# Patient Record
Sex: Female | Born: 1945
Health system: Southern US, Community
[De-identification: ages and names within clinical notes are randomized; demographics above are authoritative.]

## PROBLEM LIST (undated history)

## (undated) ENCOUNTER — Emergency Department (HOSPITAL_COMMUNITY): Payer: Medicare Other | Source: Home / Self Care

## (undated) DIAGNOSIS — Z531 Procedure and treatment not carried out because of patient's decision for reasons of belief and group pressure: Secondary | ICD-10-CM

## (undated) DIAGNOSIS — K5900 Constipation, unspecified: Secondary | ICD-10-CM

## (undated) DIAGNOSIS — F32A Depression, unspecified: Secondary | ICD-10-CM

## (undated) DIAGNOSIS — I70229 Atherosclerosis of native arteries of extremities with rest pain, unspecified extremity: Secondary | ICD-10-CM

## (undated) DIAGNOSIS — R011 Cardiac murmur, unspecified: Secondary | ICD-10-CM

## (undated) DIAGNOSIS — F419 Anxiety disorder, unspecified: Secondary | ICD-10-CM

## (undated) DIAGNOSIS — N186 End stage renal disease: Secondary | ICD-10-CM

## (undated) DIAGNOSIS — K219 Gastro-esophageal reflux disease without esophagitis: Secondary | ICD-10-CM

## (undated) DIAGNOSIS — I998 Other disorder of circulatory system: Secondary | ICD-10-CM

## (undated) DIAGNOSIS — D649 Anemia, unspecified: Secondary | ICD-10-CM

## (undated) DIAGNOSIS — E119 Type 2 diabetes mellitus without complications: Secondary | ICD-10-CM

## (undated) DIAGNOSIS — R413 Other amnesia: Secondary | ICD-10-CM

## (undated) DIAGNOSIS — I509 Heart failure, unspecified: Secondary | ICD-10-CM

## (undated) DIAGNOSIS — IMO0001 Reserved for inherently not codable concepts without codable children: Secondary | ICD-10-CM

## (undated) DIAGNOSIS — I739 Peripheral vascular disease, unspecified: Secondary | ICD-10-CM

## (undated) DIAGNOSIS — E785 Hyperlipidemia, unspecified: Secondary | ICD-10-CM

## (undated) DIAGNOSIS — F329 Major depressive disorder, single episode, unspecified: Secondary | ICD-10-CM

## (undated) DIAGNOSIS — G4730 Sleep apnea, unspecified: Secondary | ICD-10-CM

## (undated) DIAGNOSIS — R569 Unspecified convulsions: Secondary | ICD-10-CM

## (undated) DIAGNOSIS — I639 Cerebral infarction, unspecified: Secondary | ICD-10-CM

## (undated) DIAGNOSIS — I10 Essential (primary) hypertension: Secondary | ICD-10-CM

## (undated) DIAGNOSIS — Z992 Dependence on renal dialysis: Secondary | ICD-10-CM

## (undated) HISTORY — DX: Hyperlipidemia, unspecified: E78.5

## (undated) HISTORY — DX: Other disorder of circulatory system: I99.8

## (undated) HISTORY — PX: TOE AMPUTATION: SHX809

## (undated) HISTORY — DX: Atherosclerosis of native arteries of extremities with rest pain, unspecified extremity: I70.229

## (undated) HISTORY — PX: DILATION AND CURETTAGE OF UTERUS: SHX78

## (undated) HISTORY — PX: ABDOMINAL HYSTERECTOMY: SHX81

## (undated) HISTORY — PX: RETINOPATHY SURGERY: SHX765

## (undated) HISTORY — PX: EYE SURGERY: SHX253

## (undated) HISTORY — PX: TONSILLECTOMY: SUR1361

---

## 2013-11-12 DIAGNOSIS — I639 Cerebral infarction, unspecified: Secondary | ICD-10-CM

## 2013-11-12 HISTORY — DX: Cerebral infarction, unspecified: I63.9

## 2014-01-28 ENCOUNTER — Emergency Department (HOSPITAL_COMMUNITY)
Admission: EM | Admit: 2014-01-28 | Discharge: 2014-01-28 | Disposition: A | Discharge status: Home / Self Care | Payer: Medicare Other | Attending: Emergency Medicine | Admitting: Emergency Medicine

## 2014-01-28 ENCOUNTER — Encounter (HOSPITAL_COMMUNITY): Payer: Self-pay | Admitting: Emergency Medicine

## 2014-01-28 DIAGNOSIS — Z862 Personal history of diseases of the blood and blood-forming organs and certain disorders involving the immune mechanism: Secondary | ICD-10-CM | POA: Insufficient documentation

## 2014-01-28 DIAGNOSIS — I10 Essential (primary) hypertension: Secondary | ICD-10-CM | POA: Insufficient documentation

## 2014-01-28 DIAGNOSIS — E1169 Type 2 diabetes mellitus with other specified complication: Secondary | ICD-10-CM | POA: Insufficient documentation

## 2014-01-28 DIAGNOSIS — F17200 Nicotine dependence, unspecified, uncomplicated: Secondary | ICD-10-CM | POA: Insufficient documentation

## 2014-01-28 DIAGNOSIS — E162 Hypoglycemia, unspecified: Secondary | ICD-10-CM

## 2014-01-28 DIAGNOSIS — I69949 Monoplegia of lower limb following unspecified cerebrovascular disease affecting unspecified side: Secondary | ICD-10-CM | POA: Insufficient documentation

## 2014-01-28 DIAGNOSIS — S98139A Complete traumatic amputation of one unspecified lesser toe, initial encounter: Secondary | ICD-10-CM | POA: Insufficient documentation

## 2014-01-28 DIAGNOSIS — Z87448 Personal history of other diseases of urinary system: Secondary | ICD-10-CM | POA: Insufficient documentation

## 2014-01-28 HISTORY — DX: Essential (primary) hypertension: I10

## 2014-01-28 HISTORY — DX: Cerebral infarction, unspecified: I63.9

## 2014-01-28 HISTORY — DX: Anemia, unspecified: D64.9

## 2014-01-28 LAB — CBC WITH DIFFERENTIAL/PLATELET
BASOS ABS: 0 10*3/uL (ref 0.0–0.1)
BASOS PCT: 0 % (ref 0–1)
Eosinophils Absolute: 0.2 10*3/uL (ref 0.0–0.7)
Eosinophils Relative: 2 % (ref 0–5)
HCT: 30.9 % — ABNORMAL LOW (ref 36.0–46.0)
Hemoglobin: 10 g/dL — ABNORMAL LOW (ref 12.0–15.0)
Lymphocytes Relative: 13 % (ref 12–46)
Lymphs Abs: 1.1 10*3/uL (ref 0.7–4.0)
MCH: 28.5 pg (ref 26.0–34.0)
MCHC: 32.4 g/dL (ref 30.0–36.0)
MCV: 88 fL (ref 78.0–100.0)
Monocytes Absolute: 0.5 10*3/uL (ref 0.1–1.0)
Monocytes Relative: 6 % (ref 3–12)
NEUTROS ABS: 6.8 10*3/uL (ref 1.7–7.7)
NEUTROS PCT: 79 % — AB (ref 43–77)
PLATELETS: 250 10*3/uL (ref 150–400)
RBC: 3.51 MIL/uL — ABNORMAL LOW (ref 3.87–5.11)
RDW: 14.2 % (ref 11.5–15.5)
WBC: 8.6 10*3/uL (ref 4.0–10.5)

## 2014-01-28 LAB — COMPREHENSIVE METABOLIC PANEL
ALBUMIN: 3.4 g/dL — AB (ref 3.5–5.2)
ALT: 10 U/L (ref 0–35)
AST: 17 U/L (ref 0–37)
Alkaline Phosphatase: 147 U/L — ABNORMAL HIGH (ref 39–117)
BUN: 39 mg/dL — ABNORMAL HIGH (ref 6–23)
CO2: 22 mEq/L (ref 19–32)
CREATININE: 2.55 mg/dL — AB (ref 0.50–1.10)
Calcium: 9.2 mg/dL (ref 8.4–10.5)
Chloride: 103 mEq/L (ref 96–112)
GFR calc Af Amer: 21 mL/min — ABNORMAL LOW (ref >90)
GFR calc non Af Amer: 18 mL/min — ABNORMAL LOW (ref >90)
Glucose, Bld: 197 mg/dL — ABNORMAL HIGH (ref 70–99)
POTASSIUM: 4.9 meq/L (ref 3.7–5.3)
Sodium: 139 mEq/L (ref 137–147)
Total Bilirubin: <0.2 mg/dL — ABNORMAL LOW (ref 0.3–1.2)
Total Protein: 7.2 g/dL (ref 6.0–8.3)

## 2014-01-28 LAB — URINE MICROSCOPIC-ADD ON

## 2014-01-28 LAB — URINALYSIS, ROUTINE W REFLEX MICROSCOPIC
Bilirubin Urine: NEGATIVE
GLUCOSE, UA: NEGATIVE mg/dL
HGB URINE DIPSTICK: NEGATIVE
Ketones, ur: NEGATIVE mg/dL
Nitrite: NEGATIVE
PROTEIN: 100 mg/dL — AB
Specific Gravity, Urine: 1.012 (ref 1.005–1.030)
Urobilinogen, UA: 0.2 mg/dL (ref 0.0–1.0)
pH: 6 (ref 5.0–8.0)

## 2014-01-28 LAB — CBG MONITORING, ED
GLUCOSE-CAPILLARY: 103 mg/dL — AB (ref 70–99)
GLUCOSE-CAPILLARY: 240 mg/dL — AB (ref 70–99)
Glucose-Capillary: 381 mg/dL — ABNORMAL HIGH (ref 70–99)

## 2014-01-28 NOTE — ED Provider Notes (Signed)
CSN: [id number]     Arrival date & time 01/28/14  1028 History   First MD Initiated Contact with Patient 01/28/14 1041     Chief Complaint  Patient presents with  . Hypoglycemia     (Consider location/radiation/quality/duration/timing/severity/associated sxs/prior Treatment) HPI Comments: 68 year old female presents with hypoglycemia. She states she woke up 4 hours prior to arrival and felt dizzy. She states everyday for the past several months she felt dizzy but her glucoses are normally over 100 in the morning. Dysmorphic checked her sugar and it was 53. She felt more dizzy than normal and relates this to multiple prior episodes of hypoglycemia (though none recently) she went downstairs and had a piece of cake. This did not seem to improve her much so she called the ambulance. EMS gave her glucose and brought her glucose over 100 and she feels like her symptoms have resolved. She states she's no longer has dizziness. The patient has diabetes, hypertension, and chronic kidney disease. She's not know her normal creatinine. Her primary care physician is Dr. Garner Gavel in [place]. Patient states she otherwise has not felt ill. She's had blurry vision for several weeks to over a month but is not progressive. She's not been able to talk to her primary physician about it. It is bilateral. The patient states that she's not have a headache, fever, chest pain, shortness of breath, cough, or urinary symptoms. She last took her insulin last night at 10 PM and took 10 units of Lantus in 2-3 units of NovoLog because she was having a late meal.   Past Medical History  Diagnosis Date  . Diabetes mellitus without complication   . Kidney disease   . Hypertension   . Anemia   . Stroke    Past Surgical History  Procedure Laterality Date  . Toe amputation      R foot  . Abdominal hysterectomy     No family history on file. History  Substance Use Topics  . Smoking status: Current Every Day Smoker  .  Smokeless tobacco: Never Used  . Alcohol Use: No   OB History   Grav Para Term Preterm Abortions TAB SAB Ect Mult Living                 Review of Systems  Constitutional: Negative for fever.  Eyes: Positive for visual disturbance.  Respiratory: Negative for cough and shortness of breath.   Cardiovascular: Negative for chest pain.  Gastrointestinal: Negative for vomiting and abdominal pain.  Genitourinary: Negative for dysuria.  Neurological: Positive for dizziness. Negative for weakness and headaches.  All other systems reviewed and are negative.      Allergies  Review of patient's allergies indicates not on file.  Home Medications  No current outpatient prescriptions on file. BP 163/65  Pulse 71  Temp(Src) 97.4 F (36.3 C) (Oral)  Resp 16  SpO2 99% Physical Exam  Nursing note and vitals reviewed. Constitutional: She is oriented to person, place, and time. She appears well-developed and well-nourished.  HENT:  Head: Normocephalic and atraumatic.  Right Ear: External ear normal.  Left Ear: External ear normal.  Nose: Nose normal.  Eyes: EOM are normal. Pupils are equal, round, and reactive to light. Right eye exhibits no discharge. Left eye exhibits no discharge.  Cardiovascular: Normal rate, regular rhythm and normal heart sounds.   Pulmonary/Chest: Effort normal and breath sounds normal. She has no wheezes. She has no rales.  Abdominal: Soft. There is no tenderness.  Neurological:  She is alert and oriented to person, place, and time. GCS eye subscore is 4. GCS verbal subscore is 5. GCS motor subscore is 6.  Mild RLE weakness compared to left, patient reports this is from an old stroke. Otherwise her strength in her extremities is normal and equal. CN 2-12 grossly intact.  Skin: Skin is warm and dry.    ED Course  Procedures (including critical care time) Labs Review Labs Reviewed  CBC WITH DIFFERENTIAL - Abnormal; Notable for the following:    RBC 3.51 (*)     Hemoglobin 10.0 (*)    HCT 30.9 (*)    Neutrophils Relative % 79 (*)    All other components within normal limits  COMPREHENSIVE METABOLIC PANEL - Abnormal; Notable for the following:    Glucose, Bld 197 (*)    BUN 39 (*)    Creatinine, Ser 2.55 (*)    Albumin 3.4 (*)    Alkaline Phosphatase 147 (*)    Total Bilirubin <0.2 (*)    GFR calc non Af Amer 18 (*)    GFR calc Af Amer 21 (*)    All other components within normal limits  URINALYSIS, ROUTINE W REFLEX MICROSCOPIC - Abnormal; Notable for the following:    Protein, ur 100 (*)    Leukocytes, UA TRACE (*)    All other components within normal limits  URINE MICROSCOPIC-ADD ON - Abnormal; Notable for the following:    Bacteria, UA FEW (*)    All other components within normal limits  CBG MONITORING, ED - Abnormal; Notable for the following:    Glucose-Capillary 103 (*)    All other components within normal limits  CBG MONITORING, ED - Abnormal; Notable for the following:    Glucose-Capillary 240 (*)    All other components within normal limits  CBG MONITORING, ED - Abnormal; Notable for the following:    Glucose-Capillary 381 (*)    All other components within normal limits   Imaging Review No results found.   EKG Interpretation None      MDM   Final diagnoses:  Hypoglycemia    Patient's dizzy symptoms resolved with normalization of her glucose. She has had bilateral blurry vision for weeks, likely from either her diabetes or recent eye surgeries (several months ago). She has no eye pain and this appears chronic/subacute. I called her PCP, Dr. Hendricks Limes, who relates her Cr has been between 2.3 and 2.7, so her Cr here is at baseline. She was monitored in the ED, and has no recurrence of hypotension. He will see her closely as an outpatient, recommends she call the office at discharge, which I have explained to her. She feels comfortable going home, and at this time is stable for discharge.     Ephraim Hamburger,  MD 01/28/14 [phone]

## 2014-01-28 NOTE — ED Notes (Signed)
Pt states she feels good now but did feel weak and tired before EMS arrived, pt states she has been eating less but taking the same amount of insulin.

## 2014-01-28 NOTE — Discharge Instructions (Signed)

## 2014-01-28 NOTE — ED Notes (Signed)
Bed: XV40 Expected date:  Expected time:  Means of arrival:  Comments: EMS- Hypoglycemia

## 2014-01-28 NOTE — ED Notes (Signed)
Pt states she feels a lot better and is ready to go home, states also she just had a normal BM.

## 2014-01-28 NOTE — ED Notes (Signed)
Per EMS pt called out for high BP, when arrived pt was lethargic, stated she felt like CBG was low had just eaten a piece of cake, checked was 53, pt ate jelly sandwich, CBG 70 after, then dropped back down to 67, given instaglucose, then CBG 101, is on novolog and lantus, did take last night, couldn't remember how much she ate yesterday, pt has kidney disease which is depressed her appetite, thinking pt is not eating enough for amount of insulin pt taking. 20G LAC.

## 2014-01-28 NOTE — ED Notes (Signed)
Pt given chicken broth, graham crackers and peanut butter to eat.

## 2014-01-28 NOTE — ED Notes (Signed)
Pt given chicken broth and grape juice per pt request

## 2014-01-28 NOTE — ED Notes (Addendum)
Pt states "I'm starting to feel weird again", pt seems to be weak and tired, CBG 240. Pt states she feels, dizzy, lightheaded, weak and tired.

## 2014-11-12 DIAGNOSIS — N186 End stage renal disease: Secondary | ICD-10-CM | POA: Diagnosis not present

## 2014-11-12 DIAGNOSIS — I10 Essential (primary) hypertension: Secondary | ICD-10-CM | POA: Diagnosis not present

## 2014-11-12 DIAGNOSIS — R262 Difficulty in walking, not elsewhere classified: Secondary | ICD-10-CM | POA: Diagnosis not present

## 2014-11-12 DIAGNOSIS — M6281 Muscle weakness (generalized): Secondary | ICD-10-CM | POA: Diagnosis not present

## 2014-11-12 DIAGNOSIS — E119 Type 2 diabetes mellitus without complications: Secondary | ICD-10-CM | POA: Diagnosis not present

## 2014-11-12 DIAGNOSIS — R1312 Dysphagia, oropharyngeal phase: Secondary | ICD-10-CM | POA: Diagnosis not present

## 2014-11-12 DIAGNOSIS — Z992 Dependence on renal dialysis: Secondary | ICD-10-CM | POA: Diagnosis not present

## 2014-11-12 DIAGNOSIS — D631 Anemia in chronic kidney disease: Secondary | ICD-10-CM | POA: Diagnosis not present

## 2014-11-13 DIAGNOSIS — D688 Other specified coagulation defects: Secondary | ICD-10-CM | POA: Diagnosis not present

## 2014-11-13 DIAGNOSIS — N2581 Secondary hyperparathyroidism of renal origin: Secondary | ICD-10-CM | POA: Diagnosis not present

## 2014-11-13 DIAGNOSIS — D631 Anemia in chronic kidney disease: Secondary | ICD-10-CM | POA: Diagnosis not present

## 2014-11-13 DIAGNOSIS — Z992 Dependence on renal dialysis: Secondary | ICD-10-CM | POA: Diagnosis not present

## 2014-11-13 DIAGNOSIS — N186 End stage renal disease: Secondary | ICD-10-CM | POA: Diagnosis not present

## 2014-11-13 DIAGNOSIS — D509 Iron deficiency anemia, unspecified: Secondary | ICD-10-CM | POA: Diagnosis not present

## 2014-11-15 DIAGNOSIS — N186 End stage renal disease: Secondary | ICD-10-CM | POA: Diagnosis not present

## 2014-11-15 DIAGNOSIS — Z992 Dependence on renal dialysis: Secondary | ICD-10-CM | POA: Diagnosis not present

## 2014-11-15 DIAGNOSIS — D631 Anemia in chronic kidney disease: Secondary | ICD-10-CM | POA: Diagnosis not present

## 2014-11-15 DIAGNOSIS — R262 Difficulty in walking, not elsewhere classified: Secondary | ICD-10-CM | POA: Diagnosis not present

## 2014-11-15 DIAGNOSIS — R1312 Dysphagia, oropharyngeal phase: Secondary | ICD-10-CM | POA: Diagnosis not present

## 2014-11-15 DIAGNOSIS — M6281 Muscle weakness (generalized): Secondary | ICD-10-CM | POA: Diagnosis not present

## 2014-11-16 DIAGNOSIS — N2581 Secondary hyperparathyroidism of renal origin: Secondary | ICD-10-CM | POA: Diagnosis not present

## 2014-11-16 DIAGNOSIS — Z992 Dependence on renal dialysis: Secondary | ICD-10-CM | POA: Diagnosis not present

## 2014-11-16 DIAGNOSIS — N186 End stage renal disease: Secondary | ICD-10-CM | POA: Diagnosis not present

## 2014-11-16 DIAGNOSIS — D509 Iron deficiency anemia, unspecified: Secondary | ICD-10-CM | POA: Diagnosis not present

## 2014-11-16 DIAGNOSIS — D631 Anemia in chronic kidney disease: Secondary | ICD-10-CM | POA: Diagnosis not present

## 2014-11-16 DIAGNOSIS — D688 Other specified coagulation defects: Secondary | ICD-10-CM | POA: Diagnosis not present

## 2014-11-17 DIAGNOSIS — N186 End stage renal disease: Secondary | ICD-10-CM | POA: Diagnosis not present

## 2014-11-17 DIAGNOSIS — R262 Difficulty in walking, not elsewhere classified: Secondary | ICD-10-CM | POA: Diagnosis not present

## 2014-11-17 DIAGNOSIS — M6281 Muscle weakness (generalized): Secondary | ICD-10-CM | POA: Diagnosis not present

## 2014-11-17 DIAGNOSIS — R1312 Dysphagia, oropharyngeal phase: Secondary | ICD-10-CM | POA: Diagnosis not present

## 2014-11-17 DIAGNOSIS — D631 Anemia in chronic kidney disease: Secondary | ICD-10-CM | POA: Diagnosis not present

## 2014-11-17 DIAGNOSIS — Z992 Dependence on renal dialysis: Secondary | ICD-10-CM | POA: Diagnosis not present

## 2014-11-18 DIAGNOSIS — E1022 Type 1 diabetes mellitus with diabetic chronic kidney disease: Secondary | ICD-10-CM | POA: Diagnosis not present

## 2014-11-18 DIAGNOSIS — S37009A Unspecified injury of unspecified kidney, initial encounter: Secondary | ICD-10-CM | POA: Diagnosis not present

## 2014-11-18 DIAGNOSIS — N2581 Secondary hyperparathyroidism of renal origin: Secondary | ICD-10-CM | POA: Diagnosis not present

## 2014-11-18 DIAGNOSIS — N186 End stage renal disease: Secondary | ICD-10-CM | POA: Diagnosis not present

## 2014-11-18 DIAGNOSIS — E875 Hyperkalemia: Secondary | ICD-10-CM | POA: Diagnosis not present

## 2014-11-18 DIAGNOSIS — Z136 Encounter for screening for cardiovascular disorders: Secondary | ICD-10-CM | POA: Diagnosis not present

## 2014-11-18 DIAGNOSIS — Z992 Dependence on renal dialysis: Secondary | ICD-10-CM | POA: Diagnosis not present

## 2014-11-18 DIAGNOSIS — D688 Other specified coagulation defects: Secondary | ICD-10-CM | POA: Diagnosis not present

## 2014-11-18 DIAGNOSIS — I639 Cerebral infarction, unspecified: Secondary | ICD-10-CM | POA: Diagnosis not present

## 2014-11-18 DIAGNOSIS — J9690 Respiratory failure, unspecified, unspecified whether with hypoxia or hypercapnia: Secondary | ICD-10-CM | POA: Diagnosis not present

## 2014-11-18 DIAGNOSIS — D631 Anemia in chronic kidney disease: Secondary | ICD-10-CM | POA: Diagnosis not present

## 2014-11-18 DIAGNOSIS — I509 Heart failure, unspecified: Secondary | ICD-10-CM | POA: Diagnosis not present

## 2014-11-18 DIAGNOSIS — M6281 Muscle weakness (generalized): Secondary | ICD-10-CM | POA: Diagnosis not present

## 2014-11-18 DIAGNOSIS — Z131 Encounter for screening for diabetes mellitus: Secondary | ICD-10-CM | POA: Diagnosis not present

## 2014-11-18 DIAGNOSIS — D509 Iron deficiency anemia, unspecified: Secondary | ICD-10-CM | POA: Diagnosis not present

## 2014-11-18 DIAGNOSIS — R1312 Dysphagia, oropharyngeal phase: Secondary | ICD-10-CM | POA: Diagnosis not present

## 2014-11-18 DIAGNOSIS — R262 Difficulty in walking, not elsewhere classified: Secondary | ICD-10-CM | POA: Diagnosis not present

## 2014-11-19 DIAGNOSIS — R1312 Dysphagia, oropharyngeal phase: Secondary | ICD-10-CM | POA: Diagnosis not present

## 2014-11-19 DIAGNOSIS — N186 End stage renal disease: Secondary | ICD-10-CM | POA: Diagnosis not present

## 2014-11-19 DIAGNOSIS — M6281 Muscle weakness (generalized): Secondary | ICD-10-CM | POA: Diagnosis not present

## 2014-11-19 DIAGNOSIS — R262 Difficulty in walking, not elsewhere classified: Secondary | ICD-10-CM | POA: Diagnosis not present

## 2014-11-19 DIAGNOSIS — D631 Anemia in chronic kidney disease: Secondary | ICD-10-CM | POA: Diagnosis not present

## 2014-11-19 DIAGNOSIS — Z992 Dependence on renal dialysis: Secondary | ICD-10-CM | POA: Diagnosis not present

## 2014-11-23 DIAGNOSIS — Z992 Dependence on renal dialysis: Secondary | ICD-10-CM | POA: Diagnosis not present

## 2014-11-23 DIAGNOSIS — D688 Other specified coagulation defects: Secondary | ICD-10-CM | POA: Diagnosis not present

## 2014-11-23 DIAGNOSIS — D509 Iron deficiency anemia, unspecified: Secondary | ICD-10-CM | POA: Diagnosis not present

## 2014-11-23 DIAGNOSIS — J9690 Respiratory failure, unspecified, unspecified whether with hypoxia or hypercapnia: Secondary | ICD-10-CM | POA: Diagnosis not present

## 2014-11-23 DIAGNOSIS — D631 Anemia in chronic kidney disease: Secondary | ICD-10-CM | POA: Diagnosis not present

## 2014-11-23 DIAGNOSIS — N2581 Secondary hyperparathyroidism of renal origin: Secondary | ICD-10-CM | POA: Diagnosis not present

## 2014-11-23 DIAGNOSIS — I509 Heart failure, unspecified: Secondary | ICD-10-CM | POA: Diagnosis not present

## 2014-11-23 DIAGNOSIS — N186 End stage renal disease: Secondary | ICD-10-CM | POA: Diagnosis not present

## 2014-11-23 DIAGNOSIS — I639 Cerebral infarction, unspecified: Secondary | ICD-10-CM | POA: Diagnosis not present

## 2014-11-25 DIAGNOSIS — D631 Anemia in chronic kidney disease: Secondary | ICD-10-CM | POA: Diagnosis not present

## 2014-11-25 DIAGNOSIS — Z992 Dependence on renal dialysis: Secondary | ICD-10-CM | POA: Diagnosis not present

## 2014-11-25 DIAGNOSIS — D509 Iron deficiency anemia, unspecified: Secondary | ICD-10-CM | POA: Diagnosis not present

## 2014-11-25 DIAGNOSIS — N186 End stage renal disease: Secondary | ICD-10-CM | POA: Diagnosis not present

## 2014-11-25 DIAGNOSIS — I639 Cerebral infarction, unspecified: Secondary | ICD-10-CM | POA: Diagnosis not present

## 2014-11-25 DIAGNOSIS — I509 Heart failure, unspecified: Secondary | ICD-10-CM | POA: Diagnosis not present

## 2014-11-25 DIAGNOSIS — J9690 Respiratory failure, unspecified, unspecified whether with hypoxia or hypercapnia: Secondary | ICD-10-CM | POA: Diagnosis not present

## 2014-11-25 DIAGNOSIS — D688 Other specified coagulation defects: Secondary | ICD-10-CM | POA: Diagnosis not present

## 2014-11-25 DIAGNOSIS — N2581 Secondary hyperparathyroidism of renal origin: Secondary | ICD-10-CM | POA: Diagnosis not present

## 2014-11-28 DIAGNOSIS — R6889 Other general symptoms and signs: Secondary | ICD-10-CM | POA: Diagnosis not present

## 2014-11-28 DIAGNOSIS — E119 Type 2 diabetes mellitus without complications: Secondary | ICD-10-CM | POA: Diagnosis not present

## 2014-11-28 DIAGNOSIS — I120 Hypertensive chronic kidney disease with stage 5 chronic kidney disease or end stage renal disease: Secondary | ICD-10-CM | POA: Diagnosis not present

## 2014-11-28 DIAGNOSIS — Z794 Long term (current) use of insulin: Secondary | ICD-10-CM | POA: Diagnosis not present

## 2014-11-28 DIAGNOSIS — Z79899 Other long term (current) drug therapy: Secondary | ICD-10-CM | POA: Diagnosis not present

## 2014-11-28 DIAGNOSIS — Z9181 History of falling: Secondary | ICD-10-CM | POA: Diagnosis not present

## 2014-11-28 DIAGNOSIS — Z8673 Personal history of transient ischemic attack (TIA), and cerebral infarction without residual deficits: Secondary | ICD-10-CM | POA: Diagnosis not present

## 2014-11-28 DIAGNOSIS — R531 Weakness: Secondary | ICD-10-CM | POA: Diagnosis not present

## 2014-11-28 DIAGNOSIS — R112 Nausea with vomiting, unspecified: Secondary | ICD-10-CM | POA: Diagnosis not present

## 2014-11-28 DIAGNOSIS — I509 Heart failure, unspecified: Secondary | ICD-10-CM | POA: Diagnosis not present

## 2014-11-28 DIAGNOSIS — K5900 Constipation, unspecified: Secondary | ICD-10-CM | POA: Diagnosis not present

## 2014-11-28 DIAGNOSIS — I2510 Atherosclerotic heart disease of native coronary artery without angina pectoris: Secondary | ICD-10-CM | POA: Diagnosis not present

## 2014-11-28 DIAGNOSIS — F329 Major depressive disorder, single episode, unspecified: Secondary | ICD-10-CM | POA: Diagnosis not present

## 2014-11-28 DIAGNOSIS — M2550 Pain in unspecified joint: Secondary | ICD-10-CM | POA: Diagnosis not present

## 2014-11-28 DIAGNOSIS — R110 Nausea: Secondary | ICD-10-CM | POA: Diagnosis not present

## 2014-11-28 DIAGNOSIS — N186 End stage renal disease: Secondary | ICD-10-CM | POA: Diagnosis not present

## 2014-12-04 DIAGNOSIS — N2581 Secondary hyperparathyroidism of renal origin: Secondary | ICD-10-CM | POA: Diagnosis not present

## 2014-12-04 DIAGNOSIS — I509 Heart failure, unspecified: Secondary | ICD-10-CM | POA: Diagnosis not present

## 2014-12-04 DIAGNOSIS — D631 Anemia in chronic kidney disease: Secondary | ICD-10-CM | POA: Diagnosis not present

## 2014-12-04 DIAGNOSIS — D509 Iron deficiency anemia, unspecified: Secondary | ICD-10-CM | POA: Diagnosis not present

## 2014-12-04 DIAGNOSIS — Z992 Dependence on renal dialysis: Secondary | ICD-10-CM | POA: Diagnosis not present

## 2014-12-04 DIAGNOSIS — N186 End stage renal disease: Secondary | ICD-10-CM | POA: Diagnosis not present

## 2014-12-04 DIAGNOSIS — I5031 Acute diastolic (congestive) heart failure: Secondary | ICD-10-CM | POA: Diagnosis not present

## 2014-12-04 DIAGNOSIS — D688 Other specified coagulation defects: Secondary | ICD-10-CM | POA: Diagnosis not present

## 2014-12-04 DIAGNOSIS — J9600 Acute respiratory failure, unspecified whether with hypoxia or hypercapnia: Secondary | ICD-10-CM | POA: Diagnosis not present

## 2014-12-04 DIAGNOSIS — I639 Cerebral infarction, unspecified: Secondary | ICD-10-CM | POA: Diagnosis not present

## 2014-12-06 DIAGNOSIS — N186 End stage renal disease: Secondary | ICD-10-CM | POA: Diagnosis not present

## 2014-12-07 DIAGNOSIS — D631 Anemia in chronic kidney disease: Secondary | ICD-10-CM | POA: Diagnosis not present

## 2014-12-07 DIAGNOSIS — D688 Other specified coagulation defects: Secondary | ICD-10-CM | POA: Diagnosis not present

## 2014-12-07 DIAGNOSIS — Z992 Dependence on renal dialysis: Secondary | ICD-10-CM | POA: Diagnosis not present

## 2014-12-07 DIAGNOSIS — D509 Iron deficiency anemia, unspecified: Secondary | ICD-10-CM | POA: Diagnosis not present

## 2014-12-07 DIAGNOSIS — I509 Heart failure, unspecified: Secondary | ICD-10-CM | POA: Diagnosis not present

## 2014-12-07 DIAGNOSIS — N186 End stage renal disease: Secondary | ICD-10-CM | POA: Diagnosis not present

## 2014-12-07 DIAGNOSIS — E119 Type 2 diabetes mellitus without complications: Secondary | ICD-10-CM | POA: Diagnosis not present

## 2014-12-07 DIAGNOSIS — I639 Cerebral infarction, unspecified: Secondary | ICD-10-CM | POA: Diagnosis not present

## 2014-12-07 DIAGNOSIS — N2581 Secondary hyperparathyroidism of renal origin: Secondary | ICD-10-CM | POA: Diagnosis not present

## 2014-12-09 DIAGNOSIS — N186 End stage renal disease: Secondary | ICD-10-CM | POA: Diagnosis not present

## 2014-12-09 DIAGNOSIS — I509 Heart failure, unspecified: Secondary | ICD-10-CM | POA: Diagnosis not present

## 2014-12-09 DIAGNOSIS — I639 Cerebral infarction, unspecified: Secondary | ICD-10-CM | POA: Diagnosis not present

## 2014-12-09 DIAGNOSIS — D688 Other specified coagulation defects: Secondary | ICD-10-CM | POA: Diagnosis not present

## 2014-12-09 DIAGNOSIS — Z992 Dependence on renal dialysis: Secondary | ICD-10-CM | POA: Diagnosis not present

## 2014-12-09 DIAGNOSIS — D509 Iron deficiency anemia, unspecified: Secondary | ICD-10-CM | POA: Diagnosis not present

## 2014-12-09 DIAGNOSIS — D631 Anemia in chronic kidney disease: Secondary | ICD-10-CM | POA: Diagnosis not present

## 2014-12-09 DIAGNOSIS — E119 Type 2 diabetes mellitus without complications: Secondary | ICD-10-CM | POA: Diagnosis not present

## 2014-12-09 DIAGNOSIS — N2581 Secondary hyperparathyroidism of renal origin: Secondary | ICD-10-CM | POA: Diagnosis not present

## 2014-12-11 DIAGNOSIS — I639 Cerebral infarction, unspecified: Secondary | ICD-10-CM | POA: Diagnosis not present

## 2014-12-11 DIAGNOSIS — I509 Heart failure, unspecified: Secondary | ICD-10-CM | POA: Diagnosis not present

## 2014-12-11 DIAGNOSIS — Z992 Dependence on renal dialysis: Secondary | ICD-10-CM | POA: Diagnosis not present

## 2014-12-11 DIAGNOSIS — N2581 Secondary hyperparathyroidism of renal origin: Secondary | ICD-10-CM | POA: Diagnosis not present

## 2014-12-11 DIAGNOSIS — D631 Anemia in chronic kidney disease: Secondary | ICD-10-CM | POA: Diagnosis not present

## 2014-12-11 DIAGNOSIS — N186 End stage renal disease: Secondary | ICD-10-CM | POA: Diagnosis not present

## 2014-12-11 DIAGNOSIS — D509 Iron deficiency anemia, unspecified: Secondary | ICD-10-CM | POA: Diagnosis not present

## 2014-12-11 DIAGNOSIS — D688 Other specified coagulation defects: Secondary | ICD-10-CM | POA: Diagnosis not present

## 2014-12-11 DIAGNOSIS — E119 Type 2 diabetes mellitus without complications: Secondary | ICD-10-CM | POA: Diagnosis not present

## 2014-12-13 DIAGNOSIS — E139 Other specified diabetes mellitus without complications: Secondary | ICD-10-CM | POA: Diagnosis not present

## 2014-12-13 DIAGNOSIS — N186 End stage renal disease: Secondary | ICD-10-CM | POA: Diagnosis not present

## 2014-12-23 DIAGNOSIS — N2581 Secondary hyperparathyroidism of renal origin: Secondary | ICD-10-CM | POA: Diagnosis not present

## 2014-12-23 DIAGNOSIS — D509 Iron deficiency anemia, unspecified: Secondary | ICD-10-CM | POA: Diagnosis not present

## 2014-12-23 DIAGNOSIS — D631 Anemia in chronic kidney disease: Secondary | ICD-10-CM | POA: Diagnosis not present

## 2014-12-23 DIAGNOSIS — N186 End stage renal disease: Secondary | ICD-10-CM | POA: Diagnosis not present

## 2014-12-23 DIAGNOSIS — D688 Other specified coagulation defects: Secondary | ICD-10-CM | POA: Diagnosis not present

## 2014-12-23 DIAGNOSIS — Z992 Dependence on renal dialysis: Secondary | ICD-10-CM | POA: Diagnosis not present

## 2014-12-23 DIAGNOSIS — I639 Cerebral infarction, unspecified: Secondary | ICD-10-CM | POA: Diagnosis not present

## 2014-12-23 DIAGNOSIS — E119 Type 2 diabetes mellitus without complications: Secondary | ICD-10-CM | POA: Diagnosis not present

## 2014-12-23 DIAGNOSIS — I509 Heart failure, unspecified: Secondary | ICD-10-CM | POA: Diagnosis not present

## 2014-12-25 DIAGNOSIS — E119 Type 2 diabetes mellitus without complications: Secondary | ICD-10-CM | POA: Diagnosis not present

## 2014-12-25 DIAGNOSIS — Z992 Dependence on renal dialysis: Secondary | ICD-10-CM | POA: Diagnosis not present

## 2014-12-25 DIAGNOSIS — D509 Iron deficiency anemia, unspecified: Secondary | ICD-10-CM | POA: Diagnosis not present

## 2014-12-25 DIAGNOSIS — I509 Heart failure, unspecified: Secondary | ICD-10-CM | POA: Diagnosis not present

## 2014-12-25 DIAGNOSIS — N186 End stage renal disease: Secondary | ICD-10-CM | POA: Diagnosis not present

## 2014-12-25 DIAGNOSIS — D631 Anemia in chronic kidney disease: Secondary | ICD-10-CM | POA: Diagnosis not present

## 2014-12-25 DIAGNOSIS — N2581 Secondary hyperparathyroidism of renal origin: Secondary | ICD-10-CM | POA: Diagnosis not present

## 2014-12-25 DIAGNOSIS — I639 Cerebral infarction, unspecified: Secondary | ICD-10-CM | POA: Diagnosis not present

## 2014-12-25 DIAGNOSIS — D688 Other specified coagulation defects: Secondary | ICD-10-CM | POA: Diagnosis not present

## 2014-12-28 DIAGNOSIS — D688 Other specified coagulation defects: Secondary | ICD-10-CM | POA: Diagnosis not present

## 2014-12-28 DIAGNOSIS — I639 Cerebral infarction, unspecified: Secondary | ICD-10-CM | POA: Diagnosis not present

## 2014-12-28 DIAGNOSIS — I509 Heart failure, unspecified: Secondary | ICD-10-CM | POA: Diagnosis not present

## 2014-12-28 DIAGNOSIS — N186 End stage renal disease: Secondary | ICD-10-CM | POA: Diagnosis not present

## 2014-12-28 DIAGNOSIS — D509 Iron deficiency anemia, unspecified: Secondary | ICD-10-CM | POA: Diagnosis not present

## 2014-12-28 DIAGNOSIS — Z992 Dependence on renal dialysis: Secondary | ICD-10-CM | POA: Diagnosis not present

## 2014-12-28 DIAGNOSIS — D631 Anemia in chronic kidney disease: Secondary | ICD-10-CM | POA: Diagnosis not present

## 2014-12-28 DIAGNOSIS — N2581 Secondary hyperparathyroidism of renal origin: Secondary | ICD-10-CM | POA: Diagnosis not present

## 2014-12-28 DIAGNOSIS — E119 Type 2 diabetes mellitus without complications: Secondary | ICD-10-CM | POA: Diagnosis not present

## 2014-12-30 DIAGNOSIS — E119 Type 2 diabetes mellitus without complications: Secondary | ICD-10-CM | POA: Diagnosis not present

## 2014-12-30 DIAGNOSIS — I639 Cerebral infarction, unspecified: Secondary | ICD-10-CM | POA: Diagnosis not present

## 2014-12-30 DIAGNOSIS — N186 End stage renal disease: Secondary | ICD-10-CM | POA: Diagnosis not present

## 2014-12-30 DIAGNOSIS — Z992 Dependence on renal dialysis: Secondary | ICD-10-CM | POA: Diagnosis not present

## 2014-12-30 DIAGNOSIS — N2581 Secondary hyperparathyroidism of renal origin: Secondary | ICD-10-CM | POA: Diagnosis not present

## 2014-12-30 DIAGNOSIS — D631 Anemia in chronic kidney disease: Secondary | ICD-10-CM | POA: Diagnosis not present

## 2014-12-30 DIAGNOSIS — D688 Other specified coagulation defects: Secondary | ICD-10-CM | POA: Diagnosis not present

## 2014-12-30 DIAGNOSIS — D509 Iron deficiency anemia, unspecified: Secondary | ICD-10-CM | POA: Diagnosis not present

## 2014-12-30 DIAGNOSIS — I509 Heart failure, unspecified: Secondary | ICD-10-CM | POA: Diagnosis not present

## 2015-01-01 DIAGNOSIS — D509 Iron deficiency anemia, unspecified: Secondary | ICD-10-CM | POA: Diagnosis not present

## 2015-01-01 DIAGNOSIS — I509 Heart failure, unspecified: Secondary | ICD-10-CM | POA: Diagnosis not present

## 2015-01-01 DIAGNOSIS — Z992 Dependence on renal dialysis: Secondary | ICD-10-CM | POA: Diagnosis not present

## 2015-01-01 DIAGNOSIS — D631 Anemia in chronic kidney disease: Secondary | ICD-10-CM | POA: Diagnosis not present

## 2015-01-01 DIAGNOSIS — D688 Other specified coagulation defects: Secondary | ICD-10-CM | POA: Diagnosis not present

## 2015-01-01 DIAGNOSIS — E119 Type 2 diabetes mellitus without complications: Secondary | ICD-10-CM | POA: Diagnosis not present

## 2015-01-01 DIAGNOSIS — N186 End stage renal disease: Secondary | ICD-10-CM | POA: Diagnosis not present

## 2015-01-01 DIAGNOSIS — I639 Cerebral infarction, unspecified: Secondary | ICD-10-CM | POA: Diagnosis not present

## 2015-01-01 DIAGNOSIS — N2581 Secondary hyperparathyroidism of renal origin: Secondary | ICD-10-CM | POA: Diagnosis not present

## 2015-01-04 DIAGNOSIS — I639 Cerebral infarction, unspecified: Secondary | ICD-10-CM | POA: Diagnosis not present

## 2015-01-04 DIAGNOSIS — D509 Iron deficiency anemia, unspecified: Secondary | ICD-10-CM | POA: Diagnosis not present

## 2015-01-04 DIAGNOSIS — E119 Type 2 diabetes mellitus without complications: Secondary | ICD-10-CM | POA: Diagnosis not present

## 2015-01-04 DIAGNOSIS — Z992 Dependence on renal dialysis: Secondary | ICD-10-CM | POA: Diagnosis not present

## 2015-01-04 DIAGNOSIS — D688 Other specified coagulation defects: Secondary | ICD-10-CM | POA: Diagnosis not present

## 2015-01-04 DIAGNOSIS — N2581 Secondary hyperparathyroidism of renal origin: Secondary | ICD-10-CM | POA: Diagnosis not present

## 2015-01-04 DIAGNOSIS — D631 Anemia in chronic kidney disease: Secondary | ICD-10-CM | POA: Diagnosis not present

## 2015-01-04 DIAGNOSIS — N186 End stage renal disease: Secondary | ICD-10-CM | POA: Diagnosis not present

## 2015-01-04 DIAGNOSIS — I509 Heart failure, unspecified: Secondary | ICD-10-CM | POA: Diagnosis not present

## 2015-01-06 DIAGNOSIS — I639 Cerebral infarction, unspecified: Secondary | ICD-10-CM | POA: Diagnosis not present

## 2015-01-06 DIAGNOSIS — H26493 Other secondary cataract, bilateral: Secondary | ICD-10-CM | POA: Diagnosis not present

## 2015-01-06 DIAGNOSIS — N186 End stage renal disease: Secondary | ICD-10-CM | POA: Diagnosis not present

## 2015-01-06 DIAGNOSIS — Z79899 Other long term (current) drug therapy: Secondary | ICD-10-CM | POA: Diagnosis not present

## 2015-01-06 DIAGNOSIS — H524 Presbyopia: Secondary | ICD-10-CM | POA: Diagnosis not present

## 2015-01-06 DIAGNOSIS — E11359 Type 2 diabetes mellitus with proliferative diabetic retinopathy without macular edema: Secondary | ICD-10-CM | POA: Diagnosis not present

## 2015-01-06 DIAGNOSIS — D509 Iron deficiency anemia, unspecified: Secondary | ICD-10-CM | POA: Diagnosis not present

## 2015-01-06 DIAGNOSIS — I509 Heart failure, unspecified: Secondary | ICD-10-CM | POA: Diagnosis not present

## 2015-01-06 DIAGNOSIS — Z794 Long term (current) use of insulin: Secondary | ICD-10-CM | POA: Diagnosis not present

## 2015-01-06 DIAGNOSIS — D631 Anemia in chronic kidney disease: Secondary | ICD-10-CM | POA: Diagnosis not present

## 2015-01-06 DIAGNOSIS — Z961 Presence of intraocular lens: Secondary | ICD-10-CM | POA: Diagnosis not present

## 2015-01-06 DIAGNOSIS — E119 Type 2 diabetes mellitus without complications: Secondary | ICD-10-CM | POA: Diagnosis not present

## 2015-01-06 DIAGNOSIS — N2581 Secondary hyperparathyroidism of renal origin: Secondary | ICD-10-CM | POA: Diagnosis not present

## 2015-01-06 DIAGNOSIS — Z992 Dependence on renal dialysis: Secondary | ICD-10-CM | POA: Diagnosis not present

## 2015-01-06 DIAGNOSIS — D688 Other specified coagulation defects: Secondary | ICD-10-CM | POA: Diagnosis not present

## 2015-01-08 DIAGNOSIS — N186 End stage renal disease: Secondary | ICD-10-CM | POA: Diagnosis not present

## 2015-01-08 DIAGNOSIS — N2581 Secondary hyperparathyroidism of renal origin: Secondary | ICD-10-CM | POA: Diagnosis not present

## 2015-01-08 DIAGNOSIS — D631 Anemia in chronic kidney disease: Secondary | ICD-10-CM | POA: Diagnosis not present

## 2015-01-08 DIAGNOSIS — I509 Heart failure, unspecified: Secondary | ICD-10-CM | POA: Diagnosis not present

## 2015-01-08 DIAGNOSIS — D509 Iron deficiency anemia, unspecified: Secondary | ICD-10-CM | POA: Diagnosis not present

## 2015-01-08 DIAGNOSIS — D688 Other specified coagulation defects: Secondary | ICD-10-CM | POA: Diagnosis not present

## 2015-01-08 DIAGNOSIS — I639 Cerebral infarction, unspecified: Secondary | ICD-10-CM | POA: Diagnosis not present

## 2015-01-08 DIAGNOSIS — Z992 Dependence on renal dialysis: Secondary | ICD-10-CM | POA: Diagnosis not present

## 2015-01-08 DIAGNOSIS — E119 Type 2 diabetes mellitus without complications: Secondary | ICD-10-CM | POA: Diagnosis not present

## 2015-01-10 DIAGNOSIS — N186 End stage renal disease: Secondary | ICD-10-CM | POA: Diagnosis not present

## 2015-01-13 DIAGNOSIS — N186 End stage renal disease: Secondary | ICD-10-CM | POA: Diagnosis not present

## 2015-01-13 DIAGNOSIS — Z992 Dependence on renal dialysis: Secondary | ICD-10-CM | POA: Diagnosis not present

## 2015-01-13 DIAGNOSIS — D688 Other specified coagulation defects: Secondary | ICD-10-CM | POA: Diagnosis not present

## 2015-01-13 DIAGNOSIS — D509 Iron deficiency anemia, unspecified: Secondary | ICD-10-CM | POA: Diagnosis not present

## 2015-01-13 DIAGNOSIS — I509 Heart failure, unspecified: Secondary | ICD-10-CM | POA: Diagnosis not present

## 2015-01-13 DIAGNOSIS — N2581 Secondary hyperparathyroidism of renal origin: Secondary | ICD-10-CM | POA: Diagnosis not present

## 2015-01-13 DIAGNOSIS — E119 Type 2 diabetes mellitus without complications: Secondary | ICD-10-CM | POA: Diagnosis not present

## 2015-01-13 DIAGNOSIS — I639 Cerebral infarction, unspecified: Secondary | ICD-10-CM | POA: Diagnosis not present

## 2015-01-15 DIAGNOSIS — I639 Cerebral infarction, unspecified: Secondary | ICD-10-CM | POA: Diagnosis not present

## 2015-01-15 DIAGNOSIS — D688 Other specified coagulation defects: Secondary | ICD-10-CM | POA: Diagnosis not present

## 2015-01-15 DIAGNOSIS — N186 End stage renal disease: Secondary | ICD-10-CM | POA: Diagnosis not present

## 2015-01-15 DIAGNOSIS — N2581 Secondary hyperparathyroidism of renal origin: Secondary | ICD-10-CM | POA: Diagnosis not present

## 2015-01-15 DIAGNOSIS — Z992 Dependence on renal dialysis: Secondary | ICD-10-CM | POA: Diagnosis not present

## 2015-01-15 DIAGNOSIS — E119 Type 2 diabetes mellitus without complications: Secondary | ICD-10-CM | POA: Diagnosis not present

## 2015-01-15 DIAGNOSIS — I509 Heart failure, unspecified: Secondary | ICD-10-CM | POA: Diagnosis not present

## 2015-01-15 DIAGNOSIS — D509 Iron deficiency anemia, unspecified: Secondary | ICD-10-CM | POA: Diagnosis not present

## 2015-01-18 DIAGNOSIS — I639 Cerebral infarction, unspecified: Secondary | ICD-10-CM | POA: Diagnosis not present

## 2015-01-18 DIAGNOSIS — N186 End stage renal disease: Secondary | ICD-10-CM | POA: Diagnosis not present

## 2015-01-18 DIAGNOSIS — I509 Heart failure, unspecified: Secondary | ICD-10-CM | POA: Diagnosis not present

## 2015-01-18 DIAGNOSIS — N2581 Secondary hyperparathyroidism of renal origin: Secondary | ICD-10-CM | POA: Diagnosis not present

## 2015-01-18 DIAGNOSIS — Z992 Dependence on renal dialysis: Secondary | ICD-10-CM | POA: Diagnosis not present

## 2015-01-18 DIAGNOSIS — D688 Other specified coagulation defects: Secondary | ICD-10-CM | POA: Diagnosis not present

## 2015-01-18 DIAGNOSIS — E119 Type 2 diabetes mellitus without complications: Secondary | ICD-10-CM | POA: Diagnosis not present

## 2015-01-18 DIAGNOSIS — D509 Iron deficiency anemia, unspecified: Secondary | ICD-10-CM | POA: Diagnosis not present

## 2015-01-20 DIAGNOSIS — N186 End stage renal disease: Secondary | ICD-10-CM | POA: Diagnosis not present

## 2015-01-20 DIAGNOSIS — I5042 Chronic combined systolic (congestive) and diastolic (congestive) heart failure: Secondary | ICD-10-CM | POA: Diagnosis not present

## 2015-01-20 DIAGNOSIS — I10 Essential (primary) hypertension: Secondary | ICD-10-CM | POA: Diagnosis not present

## 2015-01-22 DIAGNOSIS — Z992 Dependence on renal dialysis: Secondary | ICD-10-CM | POA: Diagnosis not present

## 2015-01-22 DIAGNOSIS — I82409 Acute embolism and thrombosis of unspecified deep veins of unspecified lower extremity: Secondary | ICD-10-CM | POA: Diagnosis not present

## 2015-01-22 DIAGNOSIS — N2581 Secondary hyperparathyroidism of renal origin: Secondary | ICD-10-CM | POA: Diagnosis not present

## 2015-01-22 DIAGNOSIS — I639 Cerebral infarction, unspecified: Secondary | ICD-10-CM | POA: Diagnosis not present

## 2015-01-22 DIAGNOSIS — D509 Iron deficiency anemia, unspecified: Secondary | ICD-10-CM | POA: Diagnosis not present

## 2015-01-22 DIAGNOSIS — D688 Other specified coagulation defects: Secondary | ICD-10-CM | POA: Diagnosis not present

## 2015-01-22 DIAGNOSIS — N186 End stage renal disease: Secondary | ICD-10-CM | POA: Diagnosis not present

## 2015-01-22 DIAGNOSIS — E109 Type 1 diabetes mellitus without complications: Secondary | ICD-10-CM | POA: Diagnosis not present

## 2015-01-25 DIAGNOSIS — D688 Other specified coagulation defects: Secondary | ICD-10-CM | POA: Diagnosis not present

## 2015-01-25 DIAGNOSIS — I639 Cerebral infarction, unspecified: Secondary | ICD-10-CM | POA: Diagnosis not present

## 2015-01-25 DIAGNOSIS — N186 End stage renal disease: Secondary | ICD-10-CM | POA: Diagnosis not present

## 2015-01-25 DIAGNOSIS — N2581 Secondary hyperparathyroidism of renal origin: Secondary | ICD-10-CM | POA: Diagnosis not present

## 2015-01-25 DIAGNOSIS — E109 Type 1 diabetes mellitus without complications: Secondary | ICD-10-CM | POA: Diagnosis not present

## 2015-01-25 DIAGNOSIS — D509 Iron deficiency anemia, unspecified: Secondary | ICD-10-CM | POA: Diagnosis not present

## 2015-01-25 DIAGNOSIS — I82409 Acute embolism and thrombosis of unspecified deep veins of unspecified lower extremity: Secondary | ICD-10-CM | POA: Diagnosis not present

## 2015-01-25 DIAGNOSIS — Z992 Dependence on renal dialysis: Secondary | ICD-10-CM | POA: Diagnosis not present

## 2015-01-27 DIAGNOSIS — D509 Iron deficiency anemia, unspecified: Secondary | ICD-10-CM | POA: Diagnosis not present

## 2015-01-27 DIAGNOSIS — I639 Cerebral infarction, unspecified: Secondary | ICD-10-CM | POA: Diagnosis not present

## 2015-01-27 DIAGNOSIS — Z992 Dependence on renal dialysis: Secondary | ICD-10-CM | POA: Diagnosis not present

## 2015-01-27 DIAGNOSIS — D688 Other specified coagulation defects: Secondary | ICD-10-CM | POA: Diagnosis not present

## 2015-01-27 DIAGNOSIS — I82409 Acute embolism and thrombosis of unspecified deep veins of unspecified lower extremity: Secondary | ICD-10-CM | POA: Diagnosis not present

## 2015-01-27 DIAGNOSIS — N186 End stage renal disease: Secondary | ICD-10-CM | POA: Diagnosis not present

## 2015-01-27 DIAGNOSIS — E109 Type 1 diabetes mellitus without complications: Secondary | ICD-10-CM | POA: Diagnosis not present

## 2015-01-27 DIAGNOSIS — N2581 Secondary hyperparathyroidism of renal origin: Secondary | ICD-10-CM | POA: Diagnosis not present

## 2015-01-29 DIAGNOSIS — N2581 Secondary hyperparathyroidism of renal origin: Secondary | ICD-10-CM | POA: Diagnosis not present

## 2015-01-29 DIAGNOSIS — N186 End stage renal disease: Secondary | ICD-10-CM | POA: Diagnosis not present

## 2015-01-29 DIAGNOSIS — I639 Cerebral infarction, unspecified: Secondary | ICD-10-CM | POA: Diagnosis not present

## 2015-01-29 DIAGNOSIS — E109 Type 1 diabetes mellitus without complications: Secondary | ICD-10-CM | POA: Diagnosis not present

## 2015-01-29 DIAGNOSIS — Z992 Dependence on renal dialysis: Secondary | ICD-10-CM | POA: Diagnosis not present

## 2015-01-29 DIAGNOSIS — I82409 Acute embolism and thrombosis of unspecified deep veins of unspecified lower extremity: Secondary | ICD-10-CM | POA: Diagnosis not present

## 2015-01-29 DIAGNOSIS — D688 Other specified coagulation defects: Secondary | ICD-10-CM | POA: Diagnosis not present

## 2015-01-29 DIAGNOSIS — D509 Iron deficiency anemia, unspecified: Secondary | ICD-10-CM | POA: Diagnosis not present

## 2015-02-01 DIAGNOSIS — I639 Cerebral infarction, unspecified: Secondary | ICD-10-CM | POA: Diagnosis not present

## 2015-02-01 DIAGNOSIS — D688 Other specified coagulation defects: Secondary | ICD-10-CM | POA: Diagnosis not present

## 2015-02-01 DIAGNOSIS — N2581 Secondary hyperparathyroidism of renal origin: Secondary | ICD-10-CM | POA: Diagnosis not present

## 2015-02-01 DIAGNOSIS — I82409 Acute embolism and thrombosis of unspecified deep veins of unspecified lower extremity: Secondary | ICD-10-CM | POA: Diagnosis not present

## 2015-02-01 DIAGNOSIS — N186 End stage renal disease: Secondary | ICD-10-CM | POA: Diagnosis not present

## 2015-02-01 DIAGNOSIS — Z992 Dependence on renal dialysis: Secondary | ICD-10-CM | POA: Diagnosis not present

## 2015-02-01 DIAGNOSIS — E109 Type 1 diabetes mellitus without complications: Secondary | ICD-10-CM | POA: Diagnosis not present

## 2015-02-01 DIAGNOSIS — D509 Iron deficiency anemia, unspecified: Secondary | ICD-10-CM | POA: Diagnosis not present

## 2015-02-03 DIAGNOSIS — D688 Other specified coagulation defects: Secondary | ICD-10-CM | POA: Diagnosis not present

## 2015-02-03 DIAGNOSIS — N186 End stage renal disease: Secondary | ICD-10-CM | POA: Diagnosis not present

## 2015-02-03 DIAGNOSIS — I82409 Acute embolism and thrombosis of unspecified deep veins of unspecified lower extremity: Secondary | ICD-10-CM | POA: Diagnosis not present

## 2015-02-03 DIAGNOSIS — E109 Type 1 diabetes mellitus without complications: Secondary | ICD-10-CM | POA: Diagnosis not present

## 2015-02-03 DIAGNOSIS — Z992 Dependence on renal dialysis: Secondary | ICD-10-CM | POA: Diagnosis not present

## 2015-02-03 DIAGNOSIS — D509 Iron deficiency anemia, unspecified: Secondary | ICD-10-CM | POA: Diagnosis not present

## 2015-02-03 DIAGNOSIS — I639 Cerebral infarction, unspecified: Secondary | ICD-10-CM | POA: Diagnosis not present

## 2015-02-03 DIAGNOSIS — N2581 Secondary hyperparathyroidism of renal origin: Secondary | ICD-10-CM | POA: Diagnosis not present

## 2015-02-05 DIAGNOSIS — D688 Other specified coagulation defects: Secondary | ICD-10-CM | POA: Diagnosis not present

## 2015-02-05 DIAGNOSIS — N186 End stage renal disease: Secondary | ICD-10-CM | POA: Diagnosis not present

## 2015-02-05 DIAGNOSIS — I639 Cerebral infarction, unspecified: Secondary | ICD-10-CM | POA: Diagnosis not present

## 2015-02-05 DIAGNOSIS — Z992 Dependence on renal dialysis: Secondary | ICD-10-CM | POA: Diagnosis not present

## 2015-02-05 DIAGNOSIS — N2581 Secondary hyperparathyroidism of renal origin: Secondary | ICD-10-CM | POA: Diagnosis not present

## 2015-02-05 DIAGNOSIS — I5032 Chronic diastolic (congestive) heart failure: Secondary | ICD-10-CM | POA: Diagnosis not present

## 2015-02-05 DIAGNOSIS — J9600 Acute respiratory failure, unspecified whether with hypoxia or hypercapnia: Secondary | ICD-10-CM | POA: Diagnosis not present

## 2015-02-05 DIAGNOSIS — D509 Iron deficiency anemia, unspecified: Secondary | ICD-10-CM | POA: Diagnosis not present

## 2015-02-07 DIAGNOSIS — N186 End stage renal disease: Secondary | ICD-10-CM | POA: Diagnosis not present

## 2015-02-08 DIAGNOSIS — Z992 Dependence on renal dialysis: Secondary | ICD-10-CM | POA: Diagnosis not present

## 2015-02-08 DIAGNOSIS — I5032 Chronic diastolic (congestive) heart failure: Secondary | ICD-10-CM | POA: Diagnosis not present

## 2015-02-08 DIAGNOSIS — N186 End stage renal disease: Secondary | ICD-10-CM | POA: Diagnosis not present

## 2015-02-08 DIAGNOSIS — J9600 Acute respiratory failure, unspecified whether with hypoxia or hypercapnia: Secondary | ICD-10-CM | POA: Diagnosis not present

## 2015-02-08 DIAGNOSIS — I639 Cerebral infarction, unspecified: Secondary | ICD-10-CM | POA: Diagnosis not present

## 2015-02-08 DIAGNOSIS — N2581 Secondary hyperparathyroidism of renal origin: Secondary | ICD-10-CM | POA: Diagnosis not present

## 2015-02-08 DIAGNOSIS — D688 Other specified coagulation defects: Secondary | ICD-10-CM | POA: Diagnosis not present

## 2015-02-08 DIAGNOSIS — D509 Iron deficiency anemia, unspecified: Secondary | ICD-10-CM | POA: Diagnosis not present

## 2015-02-10 DIAGNOSIS — J9600 Acute respiratory failure, unspecified whether with hypoxia or hypercapnia: Secondary | ICD-10-CM | POA: Diagnosis not present

## 2015-02-10 DIAGNOSIS — Z992 Dependence on renal dialysis: Secondary | ICD-10-CM | POA: Diagnosis not present

## 2015-02-10 DIAGNOSIS — I639 Cerebral infarction, unspecified: Secondary | ICD-10-CM | POA: Diagnosis not present

## 2015-02-10 DIAGNOSIS — I509 Heart failure, unspecified: Secondary | ICD-10-CM | POA: Diagnosis not present

## 2015-02-10 DIAGNOSIS — N186 End stage renal disease: Secondary | ICD-10-CM | POA: Diagnosis not present

## 2015-02-10 DIAGNOSIS — D509 Iron deficiency anemia, unspecified: Secondary | ICD-10-CM | POA: Diagnosis not present

## 2015-02-10 DIAGNOSIS — N2581 Secondary hyperparathyroidism of renal origin: Secondary | ICD-10-CM | POA: Diagnosis not present

## 2015-02-10 DIAGNOSIS — D688 Other specified coagulation defects: Secondary | ICD-10-CM | POA: Diagnosis not present

## 2015-02-11 DIAGNOSIS — B351 Tinea unguium: Secondary | ICD-10-CM | POA: Diagnosis not present

## 2015-02-11 DIAGNOSIS — L84 Corns and callosities: Secondary | ICD-10-CM | POA: Diagnosis not present

## 2015-02-11 DIAGNOSIS — E1140 Type 2 diabetes mellitus with diabetic neuropathy, unspecified: Secondary | ICD-10-CM | POA: Diagnosis not present

## 2015-02-11 DIAGNOSIS — M79673 Pain in unspecified foot: Secondary | ICD-10-CM | POA: Diagnosis not present

## 2015-02-12 DIAGNOSIS — D509 Iron deficiency anemia, unspecified: Secondary | ICD-10-CM | POA: Diagnosis not present

## 2015-02-12 DIAGNOSIS — Z992 Dependence on renal dialysis: Secondary | ICD-10-CM | POA: Diagnosis not present

## 2015-02-12 DIAGNOSIS — J9600 Acute respiratory failure, unspecified whether with hypoxia or hypercapnia: Secondary | ICD-10-CM | POA: Diagnosis not present

## 2015-02-12 DIAGNOSIS — I639 Cerebral infarction, unspecified: Secondary | ICD-10-CM | POA: Diagnosis not present

## 2015-02-12 DIAGNOSIS — D688 Other specified coagulation defects: Secondary | ICD-10-CM | POA: Diagnosis not present

## 2015-02-12 DIAGNOSIS — N186 End stage renal disease: Secondary | ICD-10-CM | POA: Diagnosis not present

## 2015-02-12 DIAGNOSIS — R110 Nausea: Secondary | ICD-10-CM | POA: Diagnosis not present

## 2015-02-12 DIAGNOSIS — I509 Heart failure, unspecified: Secondary | ICD-10-CM | POA: Diagnosis not present

## 2015-02-12 DIAGNOSIS — N2581 Secondary hyperparathyroidism of renal origin: Secondary | ICD-10-CM | POA: Diagnosis not present

## 2015-02-15 DIAGNOSIS — N186 End stage renal disease: Secondary | ICD-10-CM | POA: Diagnosis not present

## 2015-02-15 DIAGNOSIS — N2581 Secondary hyperparathyroidism of renal origin: Secondary | ICD-10-CM | POA: Diagnosis not present

## 2015-02-15 DIAGNOSIS — D688 Other specified coagulation defects: Secondary | ICD-10-CM | POA: Diagnosis not present

## 2015-02-15 DIAGNOSIS — I639 Cerebral infarction, unspecified: Secondary | ICD-10-CM | POA: Diagnosis not present

## 2015-02-15 DIAGNOSIS — D509 Iron deficiency anemia, unspecified: Secondary | ICD-10-CM | POA: Diagnosis not present

## 2015-02-15 DIAGNOSIS — Z992 Dependence on renal dialysis: Secondary | ICD-10-CM | POA: Diagnosis not present

## 2015-02-15 DIAGNOSIS — R110 Nausea: Secondary | ICD-10-CM | POA: Diagnosis not present

## 2015-02-15 DIAGNOSIS — I509 Heart failure, unspecified: Secondary | ICD-10-CM | POA: Diagnosis not present

## 2015-02-15 DIAGNOSIS — J9600 Acute respiratory failure, unspecified whether with hypoxia or hypercapnia: Secondary | ICD-10-CM | POA: Diagnosis not present

## 2015-02-17 DIAGNOSIS — D688 Other specified coagulation defects: Secondary | ICD-10-CM | POA: Diagnosis not present

## 2015-02-17 DIAGNOSIS — D509 Iron deficiency anemia, unspecified: Secondary | ICD-10-CM | POA: Diagnosis not present

## 2015-02-17 DIAGNOSIS — I509 Heart failure, unspecified: Secondary | ICD-10-CM | POA: Diagnosis not present

## 2015-02-17 DIAGNOSIS — J9600 Acute respiratory failure, unspecified whether with hypoxia or hypercapnia: Secondary | ICD-10-CM | POA: Diagnosis not present

## 2015-02-17 DIAGNOSIS — N2581 Secondary hyperparathyroidism of renal origin: Secondary | ICD-10-CM | POA: Diagnosis not present

## 2015-02-17 DIAGNOSIS — R110 Nausea: Secondary | ICD-10-CM | POA: Diagnosis not present

## 2015-02-17 DIAGNOSIS — N186 End stage renal disease: Secondary | ICD-10-CM | POA: Diagnosis not present

## 2015-02-17 DIAGNOSIS — I639 Cerebral infarction, unspecified: Secondary | ICD-10-CM | POA: Diagnosis not present

## 2015-02-17 DIAGNOSIS — Z992 Dependence on renal dialysis: Secondary | ICD-10-CM | POA: Diagnosis not present

## 2015-02-19 DIAGNOSIS — N2581 Secondary hyperparathyroidism of renal origin: Secondary | ICD-10-CM | POA: Diagnosis not present

## 2015-02-19 DIAGNOSIS — I5032 Chronic diastolic (congestive) heart failure: Secondary | ICD-10-CM | POA: Diagnosis not present

## 2015-02-19 DIAGNOSIS — J9600 Acute respiratory failure, unspecified whether with hypoxia or hypercapnia: Secondary | ICD-10-CM | POA: Diagnosis not present

## 2015-02-19 DIAGNOSIS — Z992 Dependence on renal dialysis: Secondary | ICD-10-CM | POA: Diagnosis not present

## 2015-02-19 DIAGNOSIS — I639 Cerebral infarction, unspecified: Secondary | ICD-10-CM | POA: Diagnosis not present

## 2015-02-19 DIAGNOSIS — N186 End stage renal disease: Secondary | ICD-10-CM | POA: Diagnosis not present

## 2015-02-19 DIAGNOSIS — I509 Heart failure, unspecified: Secondary | ICD-10-CM | POA: Diagnosis not present

## 2015-02-19 DIAGNOSIS — D509 Iron deficiency anemia, unspecified: Secondary | ICD-10-CM | POA: Diagnosis not present

## 2015-02-19 DIAGNOSIS — R110 Nausea: Secondary | ICD-10-CM | POA: Diagnosis not present

## 2015-02-19 DIAGNOSIS — D688 Other specified coagulation defects: Secondary | ICD-10-CM | POA: Diagnosis not present

## 2015-02-22 DIAGNOSIS — J9600 Acute respiratory failure, unspecified whether with hypoxia or hypercapnia: Secondary | ICD-10-CM | POA: Diagnosis not present

## 2015-02-22 DIAGNOSIS — I5032 Chronic diastolic (congestive) heart failure: Secondary | ICD-10-CM | POA: Diagnosis not present

## 2015-02-22 DIAGNOSIS — N2581 Secondary hyperparathyroidism of renal origin: Secondary | ICD-10-CM | POA: Diagnosis not present

## 2015-02-22 DIAGNOSIS — N186 End stage renal disease: Secondary | ICD-10-CM | POA: Diagnosis not present

## 2015-02-22 DIAGNOSIS — R110 Nausea: Secondary | ICD-10-CM | POA: Diagnosis not present

## 2015-02-22 DIAGNOSIS — D688 Other specified coagulation defects: Secondary | ICD-10-CM | POA: Diagnosis not present

## 2015-02-22 DIAGNOSIS — Z992 Dependence on renal dialysis: Secondary | ICD-10-CM | POA: Diagnosis not present

## 2015-02-22 DIAGNOSIS — I639 Cerebral infarction, unspecified: Secondary | ICD-10-CM | POA: Diagnosis not present

## 2015-02-22 DIAGNOSIS — D509 Iron deficiency anemia, unspecified: Secondary | ICD-10-CM | POA: Diagnosis not present

## 2015-02-24 DIAGNOSIS — I5032 Chronic diastolic (congestive) heart failure: Secondary | ICD-10-CM | POA: Diagnosis not present

## 2015-02-24 DIAGNOSIS — Z992 Dependence on renal dialysis: Secondary | ICD-10-CM | POA: Diagnosis not present

## 2015-02-24 DIAGNOSIS — I639 Cerebral infarction, unspecified: Secondary | ICD-10-CM | POA: Diagnosis not present

## 2015-02-24 DIAGNOSIS — D509 Iron deficiency anemia, unspecified: Secondary | ICD-10-CM | POA: Diagnosis not present

## 2015-02-24 DIAGNOSIS — R110 Nausea: Secondary | ICD-10-CM | POA: Diagnosis not present

## 2015-02-24 DIAGNOSIS — N2581 Secondary hyperparathyroidism of renal origin: Secondary | ICD-10-CM | POA: Diagnosis not present

## 2015-02-24 DIAGNOSIS — D688 Other specified coagulation defects: Secondary | ICD-10-CM | POA: Diagnosis not present

## 2015-02-24 DIAGNOSIS — J9600 Acute respiratory failure, unspecified whether with hypoxia or hypercapnia: Secondary | ICD-10-CM | POA: Diagnosis not present

## 2015-02-24 DIAGNOSIS — N186 End stage renal disease: Secondary | ICD-10-CM | POA: Diagnosis not present

## 2015-02-25 DIAGNOSIS — I639 Cerebral infarction, unspecified: Secondary | ICD-10-CM | POA: Diagnosis not present

## 2015-02-25 DIAGNOSIS — Z4901 Encounter for fitting and adjustment of extracorporeal dialysis catheter: Secondary | ICD-10-CM | POA: Diagnosis not present

## 2015-02-25 DIAGNOSIS — N186 End stage renal disease: Secondary | ICD-10-CM | POA: Diagnosis not present

## 2015-02-25 DIAGNOSIS — T82591A Other mechanical complication of surgically created arteriovenous shunt, initial encounter: Secondary | ICD-10-CM | POA: Diagnosis not present

## 2015-02-25 DIAGNOSIS — I5032 Chronic diastolic (congestive) heart failure: Secondary | ICD-10-CM | POA: Diagnosis not present

## 2015-02-26 DIAGNOSIS — J9600 Acute respiratory failure, unspecified whether with hypoxia or hypercapnia: Secondary | ICD-10-CM | POA: Diagnosis not present

## 2015-02-26 DIAGNOSIS — D509 Iron deficiency anemia, unspecified: Secondary | ICD-10-CM | POA: Diagnosis not present

## 2015-02-26 DIAGNOSIS — I509 Heart failure, unspecified: Secondary | ICD-10-CM | POA: Diagnosis not present

## 2015-02-26 DIAGNOSIS — Z992 Dependence on renal dialysis: Secondary | ICD-10-CM | POA: Diagnosis not present

## 2015-02-26 DIAGNOSIS — I5032 Chronic diastolic (congestive) heart failure: Secondary | ICD-10-CM | POA: Diagnosis not present

## 2015-02-26 DIAGNOSIS — N2581 Secondary hyperparathyroidism of renal origin: Secondary | ICD-10-CM | POA: Diagnosis not present

## 2015-02-26 DIAGNOSIS — D688 Other specified coagulation defects: Secondary | ICD-10-CM | POA: Diagnosis not present

## 2015-02-26 DIAGNOSIS — I639 Cerebral infarction, unspecified: Secondary | ICD-10-CM | POA: Diagnosis not present

## 2015-02-26 DIAGNOSIS — N186 End stage renal disease: Secondary | ICD-10-CM | POA: Diagnosis not present

## 2015-02-26 DIAGNOSIS — R110 Nausea: Secondary | ICD-10-CM | POA: Diagnosis not present

## 2015-03-01 DIAGNOSIS — D688 Other specified coagulation defects: Secondary | ICD-10-CM | POA: Diagnosis not present

## 2015-03-01 DIAGNOSIS — I639 Cerebral infarction, unspecified: Secondary | ICD-10-CM | POA: Diagnosis not present

## 2015-03-01 DIAGNOSIS — N186 End stage renal disease: Secondary | ICD-10-CM | POA: Diagnosis not present

## 2015-03-01 DIAGNOSIS — R110 Nausea: Secondary | ICD-10-CM | POA: Diagnosis not present

## 2015-03-01 DIAGNOSIS — Z992 Dependence on renal dialysis: Secondary | ICD-10-CM | POA: Diagnosis not present

## 2015-03-01 DIAGNOSIS — N2581 Secondary hyperparathyroidism of renal origin: Secondary | ICD-10-CM | POA: Diagnosis not present

## 2015-03-01 DIAGNOSIS — I10 Essential (primary) hypertension: Secondary | ICD-10-CM | POA: Diagnosis not present

## 2015-03-01 DIAGNOSIS — I509 Heart failure, unspecified: Secondary | ICD-10-CM | POA: Diagnosis not present

## 2015-03-01 DIAGNOSIS — D509 Iron deficiency anemia, unspecified: Secondary | ICD-10-CM | POA: Diagnosis not present

## 2015-03-01 DIAGNOSIS — E119 Type 2 diabetes mellitus without complications: Secondary | ICD-10-CM | POA: Diagnosis not present

## 2015-03-01 DIAGNOSIS — I82409 Acute embolism and thrombosis of unspecified deep veins of unspecified lower extremity: Secondary | ICD-10-CM | POA: Diagnosis not present

## 2015-03-03 DIAGNOSIS — I639 Cerebral infarction, unspecified: Secondary | ICD-10-CM | POA: Diagnosis not present

## 2015-03-03 DIAGNOSIS — R269 Unspecified abnormalities of gait and mobility: Secondary | ICD-10-CM | POA: Diagnosis not present

## 2015-03-03 DIAGNOSIS — D688 Other specified coagulation defects: Secondary | ICD-10-CM | POA: Diagnosis not present

## 2015-03-03 DIAGNOSIS — I82409 Acute embolism and thrombosis of unspecified deep veins of unspecified lower extremity: Secondary | ICD-10-CM | POA: Diagnosis not present

## 2015-03-03 DIAGNOSIS — N2581 Secondary hyperparathyroidism of renal origin: Secondary | ICD-10-CM | POA: Diagnosis not present

## 2015-03-03 DIAGNOSIS — Z992 Dependence on renal dialysis: Secondary | ICD-10-CM | POA: Diagnosis not present

## 2015-03-03 DIAGNOSIS — R110 Nausea: Secondary | ICD-10-CM | POA: Diagnosis not present

## 2015-03-03 DIAGNOSIS — D509 Iron deficiency anemia, unspecified: Secondary | ICD-10-CM | POA: Diagnosis not present

## 2015-03-03 DIAGNOSIS — I509 Heart failure, unspecified: Secondary | ICD-10-CM | POA: Diagnosis not present

## 2015-03-03 DIAGNOSIS — N186 End stage renal disease: Secondary | ICD-10-CM | POA: Diagnosis not present

## 2015-03-03 DIAGNOSIS — I10 Essential (primary) hypertension: Secondary | ICD-10-CM | POA: Diagnosis not present

## 2015-03-05 DIAGNOSIS — I10 Essential (primary) hypertension: Secondary | ICD-10-CM | POA: Diagnosis not present

## 2015-03-05 DIAGNOSIS — E119 Type 2 diabetes mellitus without complications: Secondary | ICD-10-CM | POA: Diagnosis not present

## 2015-03-05 DIAGNOSIS — I509 Heart failure, unspecified: Secondary | ICD-10-CM | POA: Diagnosis not present

## 2015-03-05 DIAGNOSIS — N2581 Secondary hyperparathyroidism of renal origin: Secondary | ICD-10-CM | POA: Diagnosis not present

## 2015-03-05 DIAGNOSIS — N186 End stage renal disease: Secondary | ICD-10-CM | POA: Diagnosis not present

## 2015-03-05 DIAGNOSIS — I639 Cerebral infarction, unspecified: Secondary | ICD-10-CM | POA: Diagnosis not present

## 2015-03-05 DIAGNOSIS — R110 Nausea: Secondary | ICD-10-CM | POA: Diagnosis not present

## 2015-03-05 DIAGNOSIS — I82409 Acute embolism and thrombosis of unspecified deep veins of unspecified lower extremity: Secondary | ICD-10-CM | POA: Diagnosis not present

## 2015-03-05 DIAGNOSIS — Z992 Dependence on renal dialysis: Secondary | ICD-10-CM | POA: Diagnosis not present

## 2015-03-05 DIAGNOSIS — D688 Other specified coagulation defects: Secondary | ICD-10-CM | POA: Diagnosis not present

## 2015-03-05 DIAGNOSIS — D509 Iron deficiency anemia, unspecified: Secondary | ICD-10-CM | POA: Diagnosis not present

## 2015-03-07 DIAGNOSIS — N186 End stage renal disease: Secondary | ICD-10-CM | POA: Diagnosis not present

## 2015-03-08 DIAGNOSIS — E119 Type 2 diabetes mellitus without complications: Secondary | ICD-10-CM | POA: Diagnosis not present

## 2015-03-08 DIAGNOSIS — I509 Heart failure, unspecified: Secondary | ICD-10-CM | POA: Diagnosis not present

## 2015-03-08 DIAGNOSIS — R110 Nausea: Secondary | ICD-10-CM | POA: Diagnosis not present

## 2015-03-08 DIAGNOSIS — N186 End stage renal disease: Secondary | ICD-10-CM | POA: Diagnosis not present

## 2015-03-08 DIAGNOSIS — Z992 Dependence on renal dialysis: Secondary | ICD-10-CM | POA: Diagnosis not present

## 2015-03-08 DIAGNOSIS — I639 Cerebral infarction, unspecified: Secondary | ICD-10-CM | POA: Diagnosis not present

## 2015-03-08 DIAGNOSIS — D509 Iron deficiency anemia, unspecified: Secondary | ICD-10-CM | POA: Diagnosis not present

## 2015-03-08 DIAGNOSIS — I10 Essential (primary) hypertension: Secondary | ICD-10-CM | POA: Diagnosis not present

## 2015-03-08 DIAGNOSIS — N189 Chronic kidney disease, unspecified: Secondary | ICD-10-CM | POA: Diagnosis not present

## 2015-03-08 DIAGNOSIS — N2581 Secondary hyperparathyroidism of renal origin: Secondary | ICD-10-CM | POA: Diagnosis not present

## 2015-03-08 DIAGNOSIS — D688 Other specified coagulation defects: Secondary | ICD-10-CM | POA: Diagnosis not present

## 2015-03-08 DIAGNOSIS — I82409 Acute embolism and thrombosis of unspecified deep veins of unspecified lower extremity: Secondary | ICD-10-CM | POA: Diagnosis not present

## 2015-03-10 DIAGNOSIS — I639 Cerebral infarction, unspecified: Secondary | ICD-10-CM | POA: Diagnosis not present

## 2015-03-10 DIAGNOSIS — R110 Nausea: Secondary | ICD-10-CM | POA: Diagnosis not present

## 2015-03-10 DIAGNOSIS — N2581 Secondary hyperparathyroidism of renal origin: Secondary | ICD-10-CM | POA: Diagnosis not present

## 2015-03-10 DIAGNOSIS — E119 Type 2 diabetes mellitus without complications: Secondary | ICD-10-CM | POA: Diagnosis not present

## 2015-03-10 DIAGNOSIS — N186 End stage renal disease: Secondary | ICD-10-CM | POA: Diagnosis not present

## 2015-03-10 DIAGNOSIS — D688 Other specified coagulation defects: Secondary | ICD-10-CM | POA: Diagnosis not present

## 2015-03-10 DIAGNOSIS — Z992 Dependence on renal dialysis: Secondary | ICD-10-CM | POA: Diagnosis not present

## 2015-03-10 DIAGNOSIS — I10 Essential (primary) hypertension: Secondary | ICD-10-CM | POA: Diagnosis not present

## 2015-03-10 DIAGNOSIS — D509 Iron deficiency anemia, unspecified: Secondary | ICD-10-CM | POA: Diagnosis not present

## 2015-03-10 DIAGNOSIS — I82409 Acute embolism and thrombosis of unspecified deep veins of unspecified lower extremity: Secondary | ICD-10-CM | POA: Diagnosis not present

## 2015-03-10 DIAGNOSIS — I509 Heart failure, unspecified: Secondary | ICD-10-CM | POA: Diagnosis not present

## 2015-03-12 DIAGNOSIS — D688 Other specified coagulation defects: Secondary | ICD-10-CM | POA: Diagnosis not present

## 2015-03-12 DIAGNOSIS — I509 Heart failure, unspecified: Secondary | ICD-10-CM | POA: Diagnosis not present

## 2015-03-12 DIAGNOSIS — I82409 Acute embolism and thrombosis of unspecified deep veins of unspecified lower extremity: Secondary | ICD-10-CM | POA: Diagnosis not present

## 2015-03-12 DIAGNOSIS — N186 End stage renal disease: Secondary | ICD-10-CM | POA: Diagnosis not present

## 2015-03-12 DIAGNOSIS — N2581 Secondary hyperparathyroidism of renal origin: Secondary | ICD-10-CM | POA: Diagnosis not present

## 2015-03-12 DIAGNOSIS — D509 Iron deficiency anemia, unspecified: Secondary | ICD-10-CM | POA: Diagnosis not present

## 2015-03-12 DIAGNOSIS — R269 Unspecified abnormalities of gait and mobility: Secondary | ICD-10-CM | POA: Diagnosis not present

## 2015-03-12 DIAGNOSIS — R110 Nausea: Secondary | ICD-10-CM | POA: Diagnosis not present

## 2015-03-12 DIAGNOSIS — I639 Cerebral infarction, unspecified: Secondary | ICD-10-CM | POA: Diagnosis not present

## 2015-03-12 DIAGNOSIS — Z992 Dependence on renal dialysis: Secondary | ICD-10-CM | POA: Diagnosis not present

## 2015-03-15 DIAGNOSIS — E119 Type 2 diabetes mellitus without complications: Secondary | ICD-10-CM | POA: Diagnosis not present

## 2015-03-15 DIAGNOSIS — I509 Heart failure, unspecified: Secondary | ICD-10-CM | POA: Diagnosis not present

## 2015-03-15 DIAGNOSIS — I639 Cerebral infarction, unspecified: Secondary | ICD-10-CM | POA: Diagnosis not present

## 2015-03-15 DIAGNOSIS — N2581 Secondary hyperparathyroidism of renal origin: Secondary | ICD-10-CM | POA: Diagnosis not present

## 2015-03-15 DIAGNOSIS — Z992 Dependence on renal dialysis: Secondary | ICD-10-CM | POA: Diagnosis not present

## 2015-03-15 DIAGNOSIS — I10 Essential (primary) hypertension: Secondary | ICD-10-CM | POA: Diagnosis not present

## 2015-03-15 DIAGNOSIS — D509 Iron deficiency anemia, unspecified: Secondary | ICD-10-CM | POA: Diagnosis not present

## 2015-03-15 DIAGNOSIS — R112 Nausea with vomiting, unspecified: Secondary | ICD-10-CM | POA: Diagnosis not present

## 2015-03-15 DIAGNOSIS — D631 Anemia in chronic kidney disease: Secondary | ICD-10-CM | POA: Diagnosis not present

## 2015-03-15 DIAGNOSIS — N186 End stage renal disease: Secondary | ICD-10-CM | POA: Diagnosis not present

## 2015-03-17 DIAGNOSIS — J9600 Acute respiratory failure, unspecified whether with hypoxia or hypercapnia: Secondary | ICD-10-CM | POA: Diagnosis not present

## 2015-03-17 DIAGNOSIS — D631 Anemia in chronic kidney disease: Secondary | ICD-10-CM | POA: Diagnosis not present

## 2015-03-17 DIAGNOSIS — N186 End stage renal disease: Secondary | ICD-10-CM | POA: Diagnosis not present

## 2015-03-17 DIAGNOSIS — N2581 Secondary hyperparathyroidism of renal origin: Secondary | ICD-10-CM | POA: Diagnosis not present

## 2015-03-17 DIAGNOSIS — D509 Iron deficiency anemia, unspecified: Secondary | ICD-10-CM | POA: Diagnosis not present

## 2015-03-17 DIAGNOSIS — I5032 Chronic diastolic (congestive) heart failure: Secondary | ICD-10-CM | POA: Diagnosis not present

## 2015-03-17 DIAGNOSIS — R112 Nausea with vomiting, unspecified: Secondary | ICD-10-CM | POA: Diagnosis not present

## 2015-03-17 DIAGNOSIS — Z992 Dependence on renal dialysis: Secondary | ICD-10-CM | POA: Diagnosis not present

## 2015-03-17 DIAGNOSIS — I639 Cerebral infarction, unspecified: Secondary | ICD-10-CM | POA: Diagnosis not present

## 2015-03-19 DIAGNOSIS — D509 Iron deficiency anemia, unspecified: Secondary | ICD-10-CM | POA: Diagnosis not present

## 2015-03-19 DIAGNOSIS — I5031 Acute diastolic (congestive) heart failure: Secondary | ICD-10-CM | POA: Diagnosis not present

## 2015-03-19 DIAGNOSIS — I639 Cerebral infarction, unspecified: Secondary | ICD-10-CM | POA: Diagnosis not present

## 2015-03-19 DIAGNOSIS — Z992 Dependence on renal dialysis: Secondary | ICD-10-CM | POA: Diagnosis not present

## 2015-03-19 DIAGNOSIS — D631 Anemia in chronic kidney disease: Secondary | ICD-10-CM | POA: Diagnosis not present

## 2015-03-19 DIAGNOSIS — N2581 Secondary hyperparathyroidism of renal origin: Secondary | ICD-10-CM | POA: Diagnosis not present

## 2015-03-19 DIAGNOSIS — J9600 Acute respiratory failure, unspecified whether with hypoxia or hypercapnia: Secondary | ICD-10-CM | POA: Diagnosis not present

## 2015-03-19 DIAGNOSIS — N186 End stage renal disease: Secondary | ICD-10-CM | POA: Diagnosis not present

## 2015-03-19 DIAGNOSIS — I5032 Chronic diastolic (congestive) heart failure: Secondary | ICD-10-CM | POA: Diagnosis not present

## 2015-03-19 DIAGNOSIS — R112 Nausea with vomiting, unspecified: Secondary | ICD-10-CM | POA: Diagnosis not present

## 2015-03-21 DIAGNOSIS — G4733 Obstructive sleep apnea (adult) (pediatric): Secondary | ICD-10-CM | POA: Diagnosis not present

## 2015-03-21 DIAGNOSIS — G4700 Insomnia, unspecified: Secondary | ICD-10-CM | POA: Diagnosis not present

## 2015-03-21 DIAGNOSIS — G2581 Restless legs syndrome: Secondary | ICD-10-CM | POA: Diagnosis not present

## 2015-03-22 DIAGNOSIS — D509 Iron deficiency anemia, unspecified: Secondary | ICD-10-CM | POA: Diagnosis not present

## 2015-03-22 DIAGNOSIS — D631 Anemia in chronic kidney disease: Secondary | ICD-10-CM | POA: Diagnosis not present

## 2015-03-22 DIAGNOSIS — R112 Nausea with vomiting, unspecified: Secondary | ICD-10-CM | POA: Diagnosis not present

## 2015-03-22 DIAGNOSIS — N186 End stage renal disease: Secondary | ICD-10-CM | POA: Diagnosis not present

## 2015-03-22 DIAGNOSIS — J9600 Acute respiratory failure, unspecified whether with hypoxia or hypercapnia: Secondary | ICD-10-CM | POA: Diagnosis not present

## 2015-03-22 DIAGNOSIS — Z992 Dependence on renal dialysis: Secondary | ICD-10-CM | POA: Diagnosis not present

## 2015-03-22 DIAGNOSIS — I5032 Chronic diastolic (congestive) heart failure: Secondary | ICD-10-CM | POA: Diagnosis not present

## 2015-03-22 DIAGNOSIS — I639 Cerebral infarction, unspecified: Secondary | ICD-10-CM | POA: Diagnosis not present

## 2015-03-22 DIAGNOSIS — N2581 Secondary hyperparathyroidism of renal origin: Secondary | ICD-10-CM | POA: Diagnosis not present

## 2015-03-24 DIAGNOSIS — J9600 Acute respiratory failure, unspecified whether with hypoxia or hypercapnia: Secondary | ICD-10-CM | POA: Diagnosis not present

## 2015-03-24 DIAGNOSIS — Z992 Dependence on renal dialysis: Secondary | ICD-10-CM | POA: Diagnosis not present

## 2015-03-24 DIAGNOSIS — R112 Nausea with vomiting, unspecified: Secondary | ICD-10-CM | POA: Diagnosis not present

## 2015-03-24 DIAGNOSIS — D631 Anemia in chronic kidney disease: Secondary | ICD-10-CM | POA: Diagnosis not present

## 2015-03-24 DIAGNOSIS — D509 Iron deficiency anemia, unspecified: Secondary | ICD-10-CM | POA: Diagnosis not present

## 2015-03-24 DIAGNOSIS — N2581 Secondary hyperparathyroidism of renal origin: Secondary | ICD-10-CM | POA: Diagnosis not present

## 2015-03-24 DIAGNOSIS — I5032 Chronic diastolic (congestive) heart failure: Secondary | ICD-10-CM | POA: Diagnosis not present

## 2015-03-24 DIAGNOSIS — N186 End stage renal disease: Secondary | ICD-10-CM | POA: Diagnosis not present

## 2015-03-24 DIAGNOSIS — I639 Cerebral infarction, unspecified: Secondary | ICD-10-CM | POA: Diagnosis not present

## 2015-03-25 DIAGNOSIS — N186 End stage renal disease: Secondary | ICD-10-CM | POA: Diagnosis not present

## 2015-03-25 DIAGNOSIS — J9601 Acute respiratory failure with hypoxia: Secondary | ICD-10-CM | POA: Diagnosis not present

## 2015-03-25 DIAGNOSIS — N189 Chronic kidney disease, unspecified: Secondary | ICD-10-CM | POA: Diagnosis not present

## 2015-03-25 DIAGNOSIS — E119 Type 2 diabetes mellitus without complications: Secondary | ICD-10-CM | POA: Diagnosis not present

## 2015-03-25 DIAGNOSIS — Z4931 Encounter for adequacy testing for hemodialysis: Secondary | ICD-10-CM | POA: Diagnosis not present

## 2015-03-25 DIAGNOSIS — M6281 Muscle weakness (generalized): Secondary | ICD-10-CM | POA: Diagnosis not present

## 2015-03-25 DIAGNOSIS — R488 Other symbolic dysfunctions: Secondary | ICD-10-CM | POA: Diagnosis not present

## 2015-03-25 DIAGNOSIS — I5042 Chronic combined systolic (congestive) and diastolic (congestive) heart failure: Secondary | ICD-10-CM | POA: Diagnosis not present

## 2015-03-25 DIAGNOSIS — G9349 Other encephalopathy: Secondary | ICD-10-CM | POA: Diagnosis not present

## 2015-03-25 DIAGNOSIS — J188 Other pneumonia, unspecified organism: Secondary | ICD-10-CM | POA: Diagnosis not present

## 2015-03-25 DIAGNOSIS — I10 Essential (primary) hypertension: Secondary | ICD-10-CM | POA: Diagnosis not present

## 2015-03-26 DIAGNOSIS — I5032 Chronic diastolic (congestive) heart failure: Secondary | ICD-10-CM | POA: Diagnosis not present

## 2015-03-26 DIAGNOSIS — Z992 Dependence on renal dialysis: Secondary | ICD-10-CM | POA: Diagnosis not present

## 2015-03-26 DIAGNOSIS — J9600 Acute respiratory failure, unspecified whether with hypoxia or hypercapnia: Secondary | ICD-10-CM | POA: Diagnosis not present

## 2015-03-26 DIAGNOSIS — D631 Anemia in chronic kidney disease: Secondary | ICD-10-CM | POA: Diagnosis not present

## 2015-03-26 DIAGNOSIS — D509 Iron deficiency anemia, unspecified: Secondary | ICD-10-CM | POA: Diagnosis not present

## 2015-03-26 DIAGNOSIS — N2581 Secondary hyperparathyroidism of renal origin: Secondary | ICD-10-CM | POA: Diagnosis not present

## 2015-03-26 DIAGNOSIS — I639 Cerebral infarction, unspecified: Secondary | ICD-10-CM | POA: Diagnosis not present

## 2015-03-26 DIAGNOSIS — N186 End stage renal disease: Secondary | ICD-10-CM | POA: Diagnosis not present

## 2015-03-26 DIAGNOSIS — R112 Nausea with vomiting, unspecified: Secondary | ICD-10-CM | POA: Diagnosis not present

## 2015-03-29 DIAGNOSIS — Z992 Dependence on renal dialysis: Secondary | ICD-10-CM | POA: Diagnosis not present

## 2015-03-29 DIAGNOSIS — D509 Iron deficiency anemia, unspecified: Secondary | ICD-10-CM | POA: Diagnosis not present

## 2015-03-29 DIAGNOSIS — D631 Anemia in chronic kidney disease: Secondary | ICD-10-CM | POA: Diagnosis not present

## 2015-03-29 DIAGNOSIS — J9600 Acute respiratory failure, unspecified whether with hypoxia or hypercapnia: Secondary | ICD-10-CM | POA: Diagnosis not present

## 2015-03-29 DIAGNOSIS — I5032 Chronic diastolic (congestive) heart failure: Secondary | ICD-10-CM | POA: Diagnosis not present

## 2015-03-29 DIAGNOSIS — I639 Cerebral infarction, unspecified: Secondary | ICD-10-CM | POA: Diagnosis not present

## 2015-03-29 DIAGNOSIS — N186 End stage renal disease: Secondary | ICD-10-CM | POA: Diagnosis not present

## 2015-03-29 DIAGNOSIS — R112 Nausea with vomiting, unspecified: Secondary | ICD-10-CM | POA: Diagnosis not present

## 2015-03-29 DIAGNOSIS — N2581 Secondary hyperparathyroidism of renal origin: Secondary | ICD-10-CM | POA: Diagnosis not present

## 2015-03-31 DIAGNOSIS — N186 End stage renal disease: Secondary | ICD-10-CM | POA: Diagnosis not present

## 2015-03-31 DIAGNOSIS — J9600 Acute respiratory failure, unspecified whether with hypoxia or hypercapnia: Secondary | ICD-10-CM | POA: Diagnosis not present

## 2015-03-31 DIAGNOSIS — N2581 Secondary hyperparathyroidism of renal origin: Secondary | ICD-10-CM | POA: Diagnosis not present

## 2015-03-31 DIAGNOSIS — R112 Nausea with vomiting, unspecified: Secondary | ICD-10-CM | POA: Diagnosis not present

## 2015-03-31 DIAGNOSIS — D509 Iron deficiency anemia, unspecified: Secondary | ICD-10-CM | POA: Diagnosis not present

## 2015-03-31 DIAGNOSIS — I639 Cerebral infarction, unspecified: Secondary | ICD-10-CM | POA: Diagnosis not present

## 2015-03-31 DIAGNOSIS — I5032 Chronic diastolic (congestive) heart failure: Secondary | ICD-10-CM | POA: Diagnosis not present

## 2015-03-31 DIAGNOSIS — Z992 Dependence on renal dialysis: Secondary | ICD-10-CM | POA: Diagnosis not present

## 2015-03-31 DIAGNOSIS — D631 Anemia in chronic kidney disease: Secondary | ICD-10-CM | POA: Diagnosis not present

## 2015-04-02 DIAGNOSIS — I5031 Acute diastolic (congestive) heart failure: Secondary | ICD-10-CM | POA: Diagnosis not present

## 2015-04-02 DIAGNOSIS — I639 Cerebral infarction, unspecified: Secondary | ICD-10-CM | POA: Diagnosis not present

## 2015-04-02 DIAGNOSIS — D631 Anemia in chronic kidney disease: Secondary | ICD-10-CM | POA: Diagnosis not present

## 2015-04-02 DIAGNOSIS — D509 Iron deficiency anemia, unspecified: Secondary | ICD-10-CM | POA: Diagnosis not present

## 2015-04-02 DIAGNOSIS — N2581 Secondary hyperparathyroidism of renal origin: Secondary | ICD-10-CM | POA: Diagnosis not present

## 2015-04-02 DIAGNOSIS — J9600 Acute respiratory failure, unspecified whether with hypoxia or hypercapnia: Secondary | ICD-10-CM | POA: Diagnosis not present

## 2015-04-02 DIAGNOSIS — Z992 Dependence on renal dialysis: Secondary | ICD-10-CM | POA: Diagnosis not present

## 2015-04-02 DIAGNOSIS — N186 End stage renal disease: Secondary | ICD-10-CM | POA: Diagnosis not present

## 2015-04-02 DIAGNOSIS — R112 Nausea with vomiting, unspecified: Secondary | ICD-10-CM | POA: Diagnosis not present

## 2015-04-09 DIAGNOSIS — N186 End stage renal disease: Secondary | ICD-10-CM | POA: Diagnosis not present

## 2015-04-09 DIAGNOSIS — J9600 Acute respiratory failure, unspecified whether with hypoxia or hypercapnia: Secondary | ICD-10-CM | POA: Diagnosis not present

## 2015-04-09 DIAGNOSIS — N2581 Secondary hyperparathyroidism of renal origin: Secondary | ICD-10-CM | POA: Diagnosis not present

## 2015-04-09 DIAGNOSIS — R112 Nausea with vomiting, unspecified: Secondary | ICD-10-CM | POA: Diagnosis not present

## 2015-04-09 DIAGNOSIS — D631 Anemia in chronic kidney disease: Secondary | ICD-10-CM | POA: Diagnosis not present

## 2015-04-09 DIAGNOSIS — Z992 Dependence on renal dialysis: Secondary | ICD-10-CM | POA: Diagnosis not present

## 2015-04-09 DIAGNOSIS — I5032 Chronic diastolic (congestive) heart failure: Secondary | ICD-10-CM | POA: Diagnosis not present

## 2015-04-09 DIAGNOSIS — D509 Iron deficiency anemia, unspecified: Secondary | ICD-10-CM | POA: Diagnosis not present

## 2015-04-09 DIAGNOSIS — I639 Cerebral infarction, unspecified: Secondary | ICD-10-CM | POA: Diagnosis not present

## 2015-04-11 DIAGNOSIS — N186 End stage renal disease: Secondary | ICD-10-CM | POA: Diagnosis not present

## 2015-04-13 DIAGNOSIS — R569 Unspecified convulsions: Secondary | ICD-10-CM

## 2015-04-13 HISTORY — DX: Unspecified convulsions: R56.9

## 2015-04-19 DIAGNOSIS — Z79899 Other long term (current) drug therapy: Secondary | ICD-10-CM | POA: Diagnosis not present

## 2015-04-21 DIAGNOSIS — E875 Hyperkalemia: Secondary | ICD-10-CM | POA: Diagnosis not present

## 2015-04-21 DIAGNOSIS — R531 Weakness: Secondary | ICD-10-CM | POA: Diagnosis not present

## 2015-04-21 DIAGNOSIS — N186 End stage renal disease: Secondary | ICD-10-CM | POA: Diagnosis not present

## 2015-04-21 DIAGNOSIS — Z992 Dependence on renal dialysis: Secondary | ICD-10-CM | POA: Diagnosis not present

## 2015-04-21 DIAGNOSIS — I120 Hypertensive chronic kidney disease with stage 5 chronic kidney disease or end stage renal disease: Secondary | ICD-10-CM | POA: Diagnosis not present

## 2015-04-23 DIAGNOSIS — E1165 Type 2 diabetes mellitus with hyperglycemia: Secondary | ICD-10-CM | POA: Diagnosis present

## 2015-04-23 DIAGNOSIS — Z794 Long term (current) use of insulin: Secondary | ICD-10-CM | POA: Diagnosis not present

## 2015-04-23 DIAGNOSIS — Z515 Encounter for palliative care: Secondary | ICD-10-CM | POA: Diagnosis not present

## 2015-04-23 DIAGNOSIS — R4182 Altered mental status, unspecified: Secondary | ICD-10-CM | POA: Diagnosis not present

## 2015-04-23 DIAGNOSIS — N186 End stage renal disease: Secondary | ICD-10-CM | POA: Diagnosis not present

## 2015-04-23 DIAGNOSIS — F603 Borderline personality disorder: Secondary | ICD-10-CM | POA: Diagnosis present

## 2015-04-23 DIAGNOSIS — Z881 Allergy status to other antibiotic agents status: Secondary | ICD-10-CM | POA: Diagnosis not present

## 2015-04-23 DIAGNOSIS — N390 Urinary tract infection, site not specified: Secondary | ICD-10-CM | POA: Diagnosis present

## 2015-04-23 DIAGNOSIS — R42 Dizziness and giddiness: Secondary | ICD-10-CM | POA: Diagnosis not present

## 2015-04-23 DIAGNOSIS — I5032 Chronic diastolic (congestive) heart failure: Secondary | ICD-10-CM | POA: Diagnosis present

## 2015-04-23 DIAGNOSIS — Z888 Allergy status to other drugs, medicaments and biological substances status: Secondary | ICD-10-CM | POA: Diagnosis not present

## 2015-04-23 DIAGNOSIS — R9430 Abnormal result of cardiovascular function study, unspecified: Secondary | ICD-10-CM | POA: Diagnosis not present

## 2015-04-23 DIAGNOSIS — E875 Hyperkalemia: Secondary | ICD-10-CM | POA: Diagnosis not present

## 2015-04-23 DIAGNOSIS — R55 Syncope and collapse: Secondary | ICD-10-CM | POA: Diagnosis not present

## 2015-04-23 DIAGNOSIS — Z9115 Patient's noncompliance with renal dialysis: Secondary | ICD-10-CM | POA: Diagnosis present

## 2015-04-23 DIAGNOSIS — R7989 Other specified abnormal findings of blood chemistry: Secondary | ICD-10-CM | POA: Diagnosis not present

## 2015-04-23 DIAGNOSIS — N179 Acute kidney failure, unspecified: Secondary | ICD-10-CM | POA: Diagnosis present

## 2015-04-23 DIAGNOSIS — I120 Hypertensive chronic kidney disease with stage 5 chronic kidney disease or end stage renal disease: Secondary | ICD-10-CM | POA: Diagnosis present

## 2015-04-23 DIAGNOSIS — Z880 Allergy status to penicillin: Secondary | ICD-10-CM | POA: Diagnosis not present

## 2015-04-23 DIAGNOSIS — E119 Type 2 diabetes mellitus without complications: Secondary | ICD-10-CM | POA: Diagnosis not present

## 2015-04-23 DIAGNOSIS — F329 Major depressive disorder, single episode, unspecified: Secondary | ICD-10-CM | POA: Diagnosis present

## 2015-04-23 DIAGNOSIS — Z8673 Personal history of transient ischemic attack (TIA), and cerebral infarction without residual deficits: Secondary | ICD-10-CM | POA: Diagnosis not present

## 2015-04-23 DIAGNOSIS — Z992 Dependence on renal dialysis: Secondary | ICD-10-CM | POA: Diagnosis not present

## 2015-04-23 DIAGNOSIS — Z79899 Other long term (current) drug therapy: Secondary | ICD-10-CM | POA: Diagnosis not present

## 2015-04-23 DIAGNOSIS — G40909 Epilepsy, unspecified, not intractable, without status epilepticus: Secondary | ICD-10-CM | POA: Diagnosis present

## 2015-04-23 DIAGNOSIS — G9340 Encephalopathy, unspecified: Secondary | ICD-10-CM | POA: Diagnosis present

## 2015-04-23 DIAGNOSIS — E1122 Type 2 diabetes mellitus with diabetic chronic kidney disease: Secondary | ICD-10-CM | POA: Diagnosis present

## 2015-04-23 DIAGNOSIS — D631 Anemia in chronic kidney disease: Secondary | ICD-10-CM | POA: Diagnosis not present

## 2015-04-23 DIAGNOSIS — F05 Delirium due to known physiological condition: Secondary | ICD-10-CM | POA: Diagnosis not present

## 2015-04-23 DIAGNOSIS — Z7982 Long term (current) use of aspirin: Secondary | ICD-10-CM | POA: Diagnosis not present

## 2015-04-23 DIAGNOSIS — R05 Cough: Secondary | ICD-10-CM | POA: Diagnosis not present

## 2015-04-23 DIAGNOSIS — E872 Acidosis: Secondary | ICD-10-CM | POA: Diagnosis present

## 2015-04-24 DIAGNOSIS — Z992 Dependence on renal dialysis: Secondary | ICD-10-CM | POA: Diagnosis not present

## 2015-04-24 DIAGNOSIS — R4182 Altered mental status, unspecified: Secondary | ICD-10-CM | POA: Diagnosis not present

## 2015-04-24 DIAGNOSIS — R55 Syncope and collapse: Secondary | ICD-10-CM | POA: Diagnosis not present

## 2015-04-24 DIAGNOSIS — N186 End stage renal disease: Secondary | ICD-10-CM | POA: Diagnosis not present

## 2015-04-24 DIAGNOSIS — G9340 Encephalopathy, unspecified: Secondary | ICD-10-CM | POA: Diagnosis not present

## 2015-04-25 DIAGNOSIS — G9340 Encephalopathy, unspecified: Secondary | ICD-10-CM | POA: Diagnosis not present

## 2015-04-25 DIAGNOSIS — N186 End stage renal disease: Secondary | ICD-10-CM | POA: Diagnosis not present

## 2015-04-25 DIAGNOSIS — R55 Syncope and collapse: Secondary | ICD-10-CM | POA: Diagnosis not present

## 2015-04-25 DIAGNOSIS — Z992 Dependence on renal dialysis: Secondary | ICD-10-CM | POA: Diagnosis not present

## 2015-04-26 DIAGNOSIS — J188 Other pneumonia, unspecified organism: Secondary | ICD-10-CM | POA: Diagnosis not present

## 2015-04-26 DIAGNOSIS — Z992 Dependence on renal dialysis: Secondary | ICD-10-CM | POA: Diagnosis not present

## 2015-04-26 DIAGNOSIS — G4733 Obstructive sleep apnea (adult) (pediatric): Secondary | ICD-10-CM | POA: Diagnosis not present

## 2015-04-26 DIAGNOSIS — F329 Major depressive disorder, single episode, unspecified: Secondary | ICD-10-CM | POA: Diagnosis not present

## 2015-04-26 DIAGNOSIS — I509 Heart failure, unspecified: Secondary | ICD-10-CM | POA: Diagnosis not present

## 2015-04-26 DIAGNOSIS — I120 Hypertensive chronic kidney disease with stage 5 chronic kidney disease or end stage renal disease: Secondary | ICD-10-CM | POA: Diagnosis not present

## 2015-04-26 DIAGNOSIS — R4182 Altered mental status, unspecified: Secondary | ICD-10-CM | POA: Diagnosis not present

## 2015-04-26 DIAGNOSIS — N186 End stage renal disease: Secondary | ICD-10-CM | POA: Diagnosis not present

## 2015-04-26 DIAGNOSIS — I639 Cerebral infarction, unspecified: Secondary | ICD-10-CM | POA: Diagnosis not present

## 2015-04-26 DIAGNOSIS — J9600 Acute respiratory failure, unspecified whether with hypoxia or hypercapnia: Secondary | ICD-10-CM | POA: Diagnosis not present

## 2015-04-26 DIAGNOSIS — D631 Anemia in chronic kidney disease: Secondary | ICD-10-CM | POA: Diagnosis not present

## 2015-04-26 DIAGNOSIS — G9340 Encephalopathy, unspecified: Secondary | ICD-10-CM | POA: Diagnosis not present

## 2015-04-26 DIAGNOSIS — R55 Syncope and collapse: Secondary | ICD-10-CM | POA: Diagnosis not present

## 2015-04-26 DIAGNOSIS — J9601 Acute respiratory failure with hypoxia: Secondary | ICD-10-CM | POA: Diagnosis not present

## 2015-04-26 DIAGNOSIS — G4700 Insomnia, unspecified: Secondary | ICD-10-CM | POA: Diagnosis not present

## 2015-04-26 DIAGNOSIS — I10 Essential (primary) hypertension: Secondary | ICD-10-CM | POA: Diagnosis not present

## 2015-04-26 DIAGNOSIS — I6990 Unspecified sequelae of unspecified cerebrovascular disease: Secondary | ICD-10-CM | POA: Diagnosis not present

## 2015-04-26 DIAGNOSIS — E109 Type 1 diabetes mellitus without complications: Secondary | ICD-10-CM | POA: Diagnosis not present

## 2015-04-26 DIAGNOSIS — I5042 Chronic combined systolic (congestive) and diastolic (congestive) heart failure: Secondary | ICD-10-CM | POA: Diagnosis not present

## 2015-04-26 DIAGNOSIS — N2581 Secondary hyperparathyroidism of renal origin: Secondary | ICD-10-CM | POA: Diagnosis not present

## 2015-04-26 DIAGNOSIS — G2581 Restless legs syndrome: Secondary | ICD-10-CM | POA: Diagnosis not present

## 2015-04-26 DIAGNOSIS — E119 Type 2 diabetes mellitus without complications: Secondary | ICD-10-CM | POA: Diagnosis not present

## 2015-04-26 DIAGNOSIS — I5032 Chronic diastolic (congestive) heart failure: Secondary | ICD-10-CM | POA: Diagnosis not present

## 2015-04-26 DIAGNOSIS — N189 Chronic kidney disease, unspecified: Secondary | ICD-10-CM | POA: Diagnosis not present

## 2015-04-26 DIAGNOSIS — M6281 Muscle weakness (generalized): Secondary | ICD-10-CM | POA: Diagnosis not present

## 2015-04-26 DIAGNOSIS — N179 Acute kidney failure, unspecified: Secondary | ICD-10-CM | POA: Diagnosis not present

## 2015-04-26 DIAGNOSIS — R488 Other symbolic dysfunctions: Secondary | ICD-10-CM | POA: Diagnosis not present

## 2015-04-26 DIAGNOSIS — E872 Acidosis: Secondary | ICD-10-CM | POA: Diagnosis not present

## 2015-04-26 DIAGNOSIS — Z4931 Encounter for adequacy testing for hemodialysis: Secondary | ICD-10-CM | POA: Diagnosis not present

## 2015-04-26 DIAGNOSIS — R262 Difficulty in walking, not elsewhere classified: Secondary | ICD-10-CM | POA: Diagnosis not present

## 2015-04-26 DIAGNOSIS — I82409 Acute embolism and thrombosis of unspecified deep veins of unspecified lower extremity: Secondary | ICD-10-CM | POA: Diagnosis not present

## 2015-04-26 DIAGNOSIS — G92 Toxic encephalopathy: Secondary | ICD-10-CM | POA: Diagnosis not present

## 2015-04-26 DIAGNOSIS — G9349 Other encephalopathy: Secondary | ICD-10-CM | POA: Diagnosis not present

## 2015-04-26 DIAGNOSIS — H903 Sensorineural hearing loss, bilateral: Secondary | ICD-10-CM | POA: Diagnosis not present

## 2015-04-26 DIAGNOSIS — R569 Unspecified convulsions: Secondary | ICD-10-CM | POA: Diagnosis not present

## 2015-04-27 DIAGNOSIS — M6281 Muscle weakness (generalized): Secondary | ICD-10-CM | POA: Diagnosis not present

## 2015-04-27 DIAGNOSIS — G92 Toxic encephalopathy: Secondary | ICD-10-CM | POA: Diagnosis not present

## 2015-04-27 DIAGNOSIS — N186 End stage renal disease: Secondary | ICD-10-CM | POA: Diagnosis not present

## 2015-04-27 DIAGNOSIS — R262 Difficulty in walking, not elsewhere classified: Secondary | ICD-10-CM | POA: Diagnosis not present

## 2015-04-28 DIAGNOSIS — N2581 Secondary hyperparathyroidism of renal origin: Secondary | ICD-10-CM | POA: Diagnosis not present

## 2015-04-28 DIAGNOSIS — Z992 Dependence on renal dialysis: Secondary | ICD-10-CM | POA: Diagnosis not present

## 2015-04-28 DIAGNOSIS — D631 Anemia in chronic kidney disease: Secondary | ICD-10-CM | POA: Diagnosis not present

## 2015-04-28 DIAGNOSIS — N186 End stage renal disease: Secondary | ICD-10-CM | POA: Diagnosis not present

## 2015-04-30 DIAGNOSIS — N2581 Secondary hyperparathyroidism of renal origin: Secondary | ICD-10-CM | POA: Diagnosis not present

## 2015-04-30 DIAGNOSIS — D631 Anemia in chronic kidney disease: Secondary | ICD-10-CM | POA: Diagnosis not present

## 2015-04-30 DIAGNOSIS — N186 End stage renal disease: Secondary | ICD-10-CM | POA: Diagnosis not present

## 2015-04-30 DIAGNOSIS — Z992 Dependence on renal dialysis: Secondary | ICD-10-CM | POA: Diagnosis not present

## 2015-05-03 DIAGNOSIS — Z992 Dependence on renal dialysis: Secondary | ICD-10-CM | POA: Diagnosis not present

## 2015-05-03 DIAGNOSIS — N186 End stage renal disease: Secondary | ICD-10-CM | POA: Diagnosis not present

## 2015-05-03 DIAGNOSIS — D631 Anemia in chronic kidney disease: Secondary | ICD-10-CM | POA: Diagnosis not present

## 2015-05-03 DIAGNOSIS — N2581 Secondary hyperparathyroidism of renal origin: Secondary | ICD-10-CM | POA: Diagnosis not present

## 2015-05-05 DIAGNOSIS — N186 End stage renal disease: Secondary | ICD-10-CM | POA: Diagnosis not present

## 2015-05-05 DIAGNOSIS — N2581 Secondary hyperparathyroidism of renal origin: Secondary | ICD-10-CM | POA: Diagnosis not present

## 2015-05-05 DIAGNOSIS — Z992 Dependence on renal dialysis: Secondary | ICD-10-CM | POA: Diagnosis not present

## 2015-05-05 DIAGNOSIS — D631 Anemia in chronic kidney disease: Secondary | ICD-10-CM | POA: Diagnosis not present

## 2015-05-07 DIAGNOSIS — N2581 Secondary hyperparathyroidism of renal origin: Secondary | ICD-10-CM | POA: Diagnosis not present

## 2015-05-07 DIAGNOSIS — Z992 Dependence on renal dialysis: Secondary | ICD-10-CM | POA: Diagnosis not present

## 2015-05-07 DIAGNOSIS — N186 End stage renal disease: Secondary | ICD-10-CM | POA: Diagnosis not present

## 2015-05-07 DIAGNOSIS — D631 Anemia in chronic kidney disease: Secondary | ICD-10-CM | POA: Diagnosis not present

## 2015-05-09 DIAGNOSIS — N186 End stage renal disease: Secondary | ICD-10-CM | POA: Diagnosis not present

## 2015-05-10 DIAGNOSIS — N2581 Secondary hyperparathyroidism of renal origin: Secondary | ICD-10-CM | POA: Diagnosis not present

## 2015-05-10 DIAGNOSIS — Z992 Dependence on renal dialysis: Secondary | ICD-10-CM | POA: Diagnosis not present

## 2015-05-10 DIAGNOSIS — D631 Anemia in chronic kidney disease: Secondary | ICD-10-CM | POA: Diagnosis not present

## 2015-05-10 DIAGNOSIS — N186 End stage renal disease: Secondary | ICD-10-CM | POA: Diagnosis not present

## 2015-05-12 DIAGNOSIS — N2581 Secondary hyperparathyroidism of renal origin: Secondary | ICD-10-CM | POA: Diagnosis not present

## 2015-05-12 DIAGNOSIS — N186 End stage renal disease: Secondary | ICD-10-CM | POA: Diagnosis not present

## 2015-05-12 DIAGNOSIS — D631 Anemia in chronic kidney disease: Secondary | ICD-10-CM | POA: Diagnosis not present

## 2015-05-12 DIAGNOSIS — Z992 Dependence on renal dialysis: Secondary | ICD-10-CM | POA: Diagnosis not present

## 2015-05-13 DIAGNOSIS — G9340 Encephalopathy, unspecified: Secondary | ICD-10-CM | POA: Diagnosis not present

## 2015-05-13 DIAGNOSIS — R488 Other symbolic dysfunctions: Secondary | ICD-10-CM | POA: Diagnosis not present

## 2015-05-13 DIAGNOSIS — N189 Chronic kidney disease, unspecified: Secondary | ICD-10-CM | POA: Diagnosis not present

## 2015-05-13 DIAGNOSIS — Z4931 Encounter for adequacy testing for hemodialysis: Secondary | ICD-10-CM | POA: Diagnosis not present

## 2015-05-13 DIAGNOSIS — G9349 Other encephalopathy: Secondary | ICD-10-CM | POA: Diagnosis not present

## 2015-05-13 DIAGNOSIS — N186 End stage renal disease: Secondary | ICD-10-CM | POA: Diagnosis not present

## 2015-05-13 DIAGNOSIS — E119 Type 2 diabetes mellitus without complications: Secondary | ICD-10-CM | POA: Diagnosis not present

## 2015-05-13 DIAGNOSIS — J188 Other pneumonia, unspecified organism: Secondary | ICD-10-CM | POA: Diagnosis not present

## 2015-05-13 DIAGNOSIS — J9601 Acute respiratory failure with hypoxia: Secondary | ICD-10-CM | POA: Diagnosis not present

## 2015-05-13 DIAGNOSIS — I5042 Chronic combined systolic (congestive) and diastolic (congestive) heart failure: Secondary | ICD-10-CM | POA: Diagnosis not present

## 2015-05-13 DIAGNOSIS — M6281 Muscle weakness (generalized): Secondary | ICD-10-CM | POA: Diagnosis not present

## 2015-05-13 DIAGNOSIS — I10 Essential (primary) hypertension: Secondary | ICD-10-CM | POA: Diagnosis not present

## 2015-05-14 DIAGNOSIS — D631 Anemia in chronic kidney disease: Secondary | ICD-10-CM | POA: Diagnosis not present

## 2015-05-14 DIAGNOSIS — N2581 Secondary hyperparathyroidism of renal origin: Secondary | ICD-10-CM | POA: Diagnosis not present

## 2015-05-14 DIAGNOSIS — N186 End stage renal disease: Secondary | ICD-10-CM | POA: Diagnosis not present

## 2015-05-14 DIAGNOSIS — Z992 Dependence on renal dialysis: Secondary | ICD-10-CM | POA: Diagnosis not present

## 2015-05-17 DIAGNOSIS — Z992 Dependence on renal dialysis: Secondary | ICD-10-CM | POA: Diagnosis not present

## 2015-05-17 DIAGNOSIS — N2581 Secondary hyperparathyroidism of renal origin: Secondary | ICD-10-CM | POA: Diagnosis not present

## 2015-05-17 DIAGNOSIS — N186 End stage renal disease: Secondary | ICD-10-CM | POA: Diagnosis not present

## 2015-05-17 DIAGNOSIS — D631 Anemia in chronic kidney disease: Secondary | ICD-10-CM | POA: Diagnosis not present

## 2015-05-19 DIAGNOSIS — Z992 Dependence on renal dialysis: Secondary | ICD-10-CM | POA: Diagnosis not present

## 2015-05-19 DIAGNOSIS — D631 Anemia in chronic kidney disease: Secondary | ICD-10-CM | POA: Diagnosis not present

## 2015-05-19 DIAGNOSIS — N2581 Secondary hyperparathyroidism of renal origin: Secondary | ICD-10-CM | POA: Diagnosis not present

## 2015-05-19 DIAGNOSIS — N186 End stage renal disease: Secondary | ICD-10-CM | POA: Diagnosis not present

## 2015-05-21 DIAGNOSIS — D631 Anemia in chronic kidney disease: Secondary | ICD-10-CM | POA: Diagnosis not present

## 2015-05-21 DIAGNOSIS — N186 End stage renal disease: Secondary | ICD-10-CM | POA: Diagnosis not present

## 2015-05-21 DIAGNOSIS — Z992 Dependence on renal dialysis: Secondary | ICD-10-CM | POA: Diagnosis not present

## 2015-05-21 DIAGNOSIS — N2581 Secondary hyperparathyroidism of renal origin: Secondary | ICD-10-CM | POA: Diagnosis not present

## 2015-05-23 DIAGNOSIS — G4700 Insomnia, unspecified: Secondary | ICD-10-CM | POA: Diagnosis not present

## 2015-05-23 DIAGNOSIS — G4733 Obstructive sleep apnea (adult) (pediatric): Secondary | ICD-10-CM | POA: Diagnosis not present

## 2015-05-23 DIAGNOSIS — G2581 Restless legs syndrome: Secondary | ICD-10-CM | POA: Diagnosis not present

## 2015-05-24 DIAGNOSIS — J188 Other pneumonia, unspecified organism: Secondary | ICD-10-CM | POA: Diagnosis not present

## 2015-05-24 DIAGNOSIS — R488 Other symbolic dysfunctions: Secondary | ICD-10-CM | POA: Diagnosis not present

## 2015-05-24 DIAGNOSIS — M6281 Muscle weakness (generalized): Secondary | ICD-10-CM | POA: Diagnosis not present

## 2015-05-24 DIAGNOSIS — G9349 Other encephalopathy: Secondary | ICD-10-CM | POA: Diagnosis not present

## 2015-05-24 DIAGNOSIS — N186 End stage renal disease: Secondary | ICD-10-CM | POA: Diagnosis not present

## 2015-05-24 DIAGNOSIS — Z992 Dependence on renal dialysis: Secondary | ICD-10-CM | POA: Diagnosis not present

## 2015-05-24 DIAGNOSIS — I5042 Chronic combined systolic (congestive) and diastolic (congestive) heart failure: Secondary | ICD-10-CM | POA: Diagnosis not present

## 2015-05-24 DIAGNOSIS — D631 Anemia in chronic kidney disease: Secondary | ICD-10-CM | POA: Diagnosis not present

## 2015-05-24 DIAGNOSIS — N2581 Secondary hyperparathyroidism of renal origin: Secondary | ICD-10-CM | POA: Diagnosis not present

## 2015-05-24 DIAGNOSIS — J9601 Acute respiratory failure with hypoxia: Secondary | ICD-10-CM | POA: Diagnosis not present

## 2015-05-24 DIAGNOSIS — G9340 Encephalopathy, unspecified: Secondary | ICD-10-CM | POA: Diagnosis not present

## 2015-05-24 DIAGNOSIS — N189 Chronic kidney disease, unspecified: Secondary | ICD-10-CM | POA: Diagnosis not present

## 2015-05-24 DIAGNOSIS — I10 Essential (primary) hypertension: Secondary | ICD-10-CM | POA: Diagnosis not present

## 2015-05-24 DIAGNOSIS — Z4931 Encounter for adequacy testing for hemodialysis: Secondary | ICD-10-CM | POA: Diagnosis not present

## 2015-05-24 DIAGNOSIS — E119 Type 2 diabetes mellitus without complications: Secondary | ICD-10-CM | POA: Diagnosis not present

## 2015-05-25 DIAGNOSIS — H903 Sensorineural hearing loss, bilateral: Secondary | ICD-10-CM | POA: Diagnosis not present

## 2015-05-26 DIAGNOSIS — N2581 Secondary hyperparathyroidism of renal origin: Secondary | ICD-10-CM | POA: Diagnosis not present

## 2015-05-26 DIAGNOSIS — N186 End stage renal disease: Secondary | ICD-10-CM | POA: Diagnosis not present

## 2015-05-26 DIAGNOSIS — D631 Anemia in chronic kidney disease: Secondary | ICD-10-CM | POA: Diagnosis not present

## 2015-05-26 DIAGNOSIS — Z992 Dependence on renal dialysis: Secondary | ICD-10-CM | POA: Diagnosis not present

## 2015-05-28 DIAGNOSIS — I82409 Acute embolism and thrombosis of unspecified deep veins of unspecified lower extremity: Secondary | ICD-10-CM | POA: Diagnosis not present

## 2015-05-28 DIAGNOSIS — I25810 Atherosclerosis of coronary artery bypass graft(s) without angina pectoris: Secondary | ICD-10-CM | POA: Diagnosis not present

## 2015-05-28 DIAGNOSIS — N186 End stage renal disease: Secondary | ICD-10-CM | POA: Diagnosis not present

## 2015-05-28 DIAGNOSIS — I639 Cerebral infarction, unspecified: Secondary | ICD-10-CM | POA: Diagnosis not present

## 2015-05-28 DIAGNOSIS — D631 Anemia in chronic kidney disease: Secondary | ICD-10-CM | POA: Diagnosis not present

## 2015-05-28 DIAGNOSIS — Z992 Dependence on renal dialysis: Secondary | ICD-10-CM | POA: Diagnosis not present

## 2015-05-28 DIAGNOSIS — N2581 Secondary hyperparathyroidism of renal origin: Secondary | ICD-10-CM | POA: Diagnosis not present

## 2015-05-28 DIAGNOSIS — E119 Type 2 diabetes mellitus without complications: Secondary | ICD-10-CM | POA: Diagnosis not present

## 2015-05-31 DIAGNOSIS — I5032 Chronic diastolic (congestive) heart failure: Secondary | ICD-10-CM | POA: Diagnosis not present

## 2015-05-31 DIAGNOSIS — N186 End stage renal disease: Secondary | ICD-10-CM | POA: Diagnosis not present

## 2015-05-31 DIAGNOSIS — N2581 Secondary hyperparathyroidism of renal origin: Secondary | ICD-10-CM | POA: Diagnosis not present

## 2015-05-31 DIAGNOSIS — I639 Cerebral infarction, unspecified: Secondary | ICD-10-CM | POA: Diagnosis not present

## 2015-05-31 DIAGNOSIS — Z992 Dependence on renal dialysis: Secondary | ICD-10-CM | POA: Diagnosis not present

## 2015-05-31 DIAGNOSIS — E119 Type 2 diabetes mellitus without complications: Secondary | ICD-10-CM | POA: Diagnosis not present

## 2015-05-31 DIAGNOSIS — D631 Anemia in chronic kidney disease: Secondary | ICD-10-CM | POA: Diagnosis not present

## 2015-06-02 DIAGNOSIS — I509 Heart failure, unspecified: Secondary | ICD-10-CM | POA: Diagnosis not present

## 2015-06-02 DIAGNOSIS — I639 Cerebral infarction, unspecified: Secondary | ICD-10-CM | POA: Diagnosis not present

## 2015-06-02 DIAGNOSIS — N2581 Secondary hyperparathyroidism of renal origin: Secondary | ICD-10-CM | POA: Diagnosis not present

## 2015-06-02 DIAGNOSIS — D631 Anemia in chronic kidney disease: Secondary | ICD-10-CM | POA: Diagnosis not present

## 2015-06-02 DIAGNOSIS — N186 End stage renal disease: Secondary | ICD-10-CM | POA: Diagnosis not present

## 2015-06-02 DIAGNOSIS — Z992 Dependence on renal dialysis: Secondary | ICD-10-CM | POA: Diagnosis not present

## 2015-06-02 DIAGNOSIS — J9600 Acute respiratory failure, unspecified whether with hypoxia or hypercapnia: Secondary | ICD-10-CM | POA: Diagnosis not present

## 2015-06-04 DIAGNOSIS — E109 Type 1 diabetes mellitus without complications: Secondary | ICD-10-CM | POA: Diagnosis not present

## 2015-06-04 DIAGNOSIS — I82409 Acute embolism and thrombosis of unspecified deep veins of unspecified lower extremity: Secondary | ICD-10-CM | POA: Diagnosis not present

## 2015-06-04 DIAGNOSIS — I639 Cerebral infarction, unspecified: Secondary | ICD-10-CM | POA: Diagnosis not present

## 2015-06-04 DIAGNOSIS — Z992 Dependence on renal dialysis: Secondary | ICD-10-CM | POA: Diagnosis not present

## 2015-06-04 DIAGNOSIS — D631 Anemia in chronic kidney disease: Secondary | ICD-10-CM | POA: Diagnosis not present

## 2015-06-04 DIAGNOSIS — N186 End stage renal disease: Secondary | ICD-10-CM | POA: Diagnosis not present

## 2015-06-04 DIAGNOSIS — N2581 Secondary hyperparathyroidism of renal origin: Secondary | ICD-10-CM | POA: Diagnosis not present

## 2015-06-06 DIAGNOSIS — N186 End stage renal disease: Secondary | ICD-10-CM | POA: Diagnosis not present

## 2015-06-07 DIAGNOSIS — M7989 Other specified soft tissue disorders: Secondary | ICD-10-CM | POA: Diagnosis not present

## 2015-06-09 DIAGNOSIS — R0683 Snoring: Secondary | ICD-10-CM | POA: Diagnosis not present

## 2015-06-09 DIAGNOSIS — Z992 Dependence on renal dialysis: Secondary | ICD-10-CM | POA: Diagnosis not present

## 2015-06-09 DIAGNOSIS — G4761 Periodic limb movement disorder: Secondary | ICD-10-CM | POA: Diagnosis not present

## 2015-06-09 DIAGNOSIS — I639 Cerebral infarction, unspecified: Secondary | ICD-10-CM | POA: Diagnosis not present

## 2015-06-09 DIAGNOSIS — J9600 Acute respiratory failure, unspecified whether with hypoxia or hypercapnia: Secondary | ICD-10-CM | POA: Diagnosis not present

## 2015-06-09 DIAGNOSIS — N186 End stage renal disease: Secondary | ICD-10-CM | POA: Diagnosis not present

## 2015-06-09 DIAGNOSIS — G479 Sleep disorder, unspecified: Secondary | ICD-10-CM | POA: Diagnosis not present

## 2015-06-09 DIAGNOSIS — I509 Heart failure, unspecified: Secondary | ICD-10-CM | POA: Diagnosis not present

## 2015-06-09 DIAGNOSIS — G4733 Obstructive sleep apnea (adult) (pediatric): Secondary | ICD-10-CM | POA: Diagnosis not present

## 2015-06-09 DIAGNOSIS — D631 Anemia in chronic kidney disease: Secondary | ICD-10-CM | POA: Diagnosis not present

## 2015-06-09 DIAGNOSIS — N2581 Secondary hyperparathyroidism of renal origin: Secondary | ICD-10-CM | POA: Diagnosis not present

## 2015-06-11 DIAGNOSIS — D631 Anemia in chronic kidney disease: Secondary | ICD-10-CM | POA: Diagnosis not present

## 2015-06-11 DIAGNOSIS — Z992 Dependence on renal dialysis: Secondary | ICD-10-CM | POA: Diagnosis not present

## 2015-06-11 DIAGNOSIS — N2581 Secondary hyperparathyroidism of renal origin: Secondary | ICD-10-CM | POA: Diagnosis not present

## 2015-06-11 DIAGNOSIS — I639 Cerebral infarction, unspecified: Secondary | ICD-10-CM | POA: Diagnosis not present

## 2015-06-11 DIAGNOSIS — N186 End stage renal disease: Secondary | ICD-10-CM | POA: Diagnosis not present

## 2015-06-11 DIAGNOSIS — E109 Type 1 diabetes mellitus without complications: Secondary | ICD-10-CM | POA: Diagnosis not present

## 2015-06-11 DIAGNOSIS — I82409 Acute embolism and thrombosis of unspecified deep veins of unspecified lower extremity: Secondary | ICD-10-CM | POA: Diagnosis not present

## 2015-06-14 DIAGNOSIS — I639 Cerebral infarction, unspecified: Secondary | ICD-10-CM | POA: Diagnosis not present

## 2015-06-14 DIAGNOSIS — N186 End stage renal disease: Secondary | ICD-10-CM | POA: Diagnosis not present

## 2015-06-14 DIAGNOSIS — I82409 Acute embolism and thrombosis of unspecified deep veins of unspecified lower extremity: Secondary | ICD-10-CM | POA: Diagnosis not present

## 2015-06-14 DIAGNOSIS — E119 Type 2 diabetes mellitus without complications: Secondary | ICD-10-CM | POA: Diagnosis not present

## 2015-06-14 DIAGNOSIS — N2581 Secondary hyperparathyroidism of renal origin: Secondary | ICD-10-CM | POA: Diagnosis not present

## 2015-06-14 DIAGNOSIS — Z992 Dependence on renal dialysis: Secondary | ICD-10-CM | POA: Diagnosis not present

## 2015-06-14 DIAGNOSIS — A419 Sepsis, unspecified organism: Secondary | ICD-10-CM | POA: Diagnosis not present

## 2015-06-16 DIAGNOSIS — Z992 Dependence on renal dialysis: Secondary | ICD-10-CM | POA: Diagnosis not present

## 2015-06-16 DIAGNOSIS — N186 End stage renal disease: Secondary | ICD-10-CM | POA: Diagnosis not present

## 2015-06-16 DIAGNOSIS — I639 Cerebral infarction, unspecified: Secondary | ICD-10-CM | POA: Diagnosis not present

## 2015-06-16 DIAGNOSIS — I509 Heart failure, unspecified: Secondary | ICD-10-CM | POA: Diagnosis not present

## 2015-06-16 DIAGNOSIS — A419 Sepsis, unspecified organism: Secondary | ICD-10-CM | POA: Diagnosis not present

## 2015-06-16 DIAGNOSIS — N2581 Secondary hyperparathyroidism of renal origin: Secondary | ICD-10-CM | POA: Diagnosis not present

## 2015-06-16 DIAGNOSIS — J9600 Acute respiratory failure, unspecified whether with hypoxia or hypercapnia: Secondary | ICD-10-CM | POA: Diagnosis not present

## 2015-06-18 DIAGNOSIS — I639 Cerebral infarction, unspecified: Secondary | ICD-10-CM | POA: Diagnosis not present

## 2015-06-18 DIAGNOSIS — J9610 Chronic respiratory failure, unspecified whether with hypoxia or hypercapnia: Secondary | ICD-10-CM | POA: Diagnosis not present

## 2015-06-18 DIAGNOSIS — N2581 Secondary hyperparathyroidism of renal origin: Secondary | ICD-10-CM | POA: Diagnosis not present

## 2015-06-18 DIAGNOSIS — E119 Type 2 diabetes mellitus without complications: Secondary | ICD-10-CM | POA: Diagnosis not present

## 2015-06-18 DIAGNOSIS — N186 End stage renal disease: Secondary | ICD-10-CM | POA: Diagnosis not present

## 2015-06-18 DIAGNOSIS — Z992 Dependence on renal dialysis: Secondary | ICD-10-CM | POA: Diagnosis not present

## 2015-06-18 DIAGNOSIS — A419 Sepsis, unspecified organism: Secondary | ICD-10-CM | POA: Diagnosis not present

## 2015-06-21 DIAGNOSIS — E109 Type 1 diabetes mellitus without complications: Secondary | ICD-10-CM | POA: Diagnosis not present

## 2015-06-21 DIAGNOSIS — I639 Cerebral infarction, unspecified: Secondary | ICD-10-CM | POA: Diagnosis not present

## 2015-06-21 DIAGNOSIS — N2581 Secondary hyperparathyroidism of renal origin: Secondary | ICD-10-CM | POA: Diagnosis not present

## 2015-06-21 DIAGNOSIS — J9610 Chronic respiratory failure, unspecified whether with hypoxia or hypercapnia: Secondary | ICD-10-CM | POA: Diagnosis not present

## 2015-06-21 DIAGNOSIS — Z992 Dependence on renal dialysis: Secondary | ICD-10-CM | POA: Diagnosis not present

## 2015-06-21 DIAGNOSIS — A419 Sepsis, unspecified organism: Secondary | ICD-10-CM | POA: Diagnosis not present

## 2015-06-21 DIAGNOSIS — N186 End stage renal disease: Secondary | ICD-10-CM | POA: Diagnosis not present

## 2015-06-23 DIAGNOSIS — E109 Type 1 diabetes mellitus without complications: Secondary | ICD-10-CM | POA: Diagnosis not present

## 2015-06-23 DIAGNOSIS — N2581 Secondary hyperparathyroidism of renal origin: Secondary | ICD-10-CM | POA: Diagnosis not present

## 2015-06-23 DIAGNOSIS — I82409 Acute embolism and thrombosis of unspecified deep veins of unspecified lower extremity: Secondary | ICD-10-CM | POA: Diagnosis not present

## 2015-06-23 DIAGNOSIS — A419 Sepsis, unspecified organism: Secondary | ICD-10-CM | POA: Diagnosis not present

## 2015-06-23 DIAGNOSIS — Z992 Dependence on renal dialysis: Secondary | ICD-10-CM | POA: Diagnosis not present

## 2015-06-23 DIAGNOSIS — I639 Cerebral infarction, unspecified: Secondary | ICD-10-CM | POA: Diagnosis not present

## 2015-06-23 DIAGNOSIS — N186 End stage renal disease: Secondary | ICD-10-CM | POA: Diagnosis not present

## 2015-06-25 DIAGNOSIS — I639 Cerebral infarction, unspecified: Secondary | ICD-10-CM | POA: Diagnosis not present

## 2015-06-25 DIAGNOSIS — E119 Type 2 diabetes mellitus without complications: Secondary | ICD-10-CM | POA: Diagnosis not present

## 2015-06-25 DIAGNOSIS — Z992 Dependence on renal dialysis: Secondary | ICD-10-CM | POA: Diagnosis not present

## 2015-06-25 DIAGNOSIS — E109 Type 1 diabetes mellitus without complications: Secondary | ICD-10-CM | POA: Diagnosis not present

## 2015-06-25 DIAGNOSIS — N2581 Secondary hyperparathyroidism of renal origin: Secondary | ICD-10-CM | POA: Diagnosis not present

## 2015-06-25 DIAGNOSIS — A419 Sepsis, unspecified organism: Secondary | ICD-10-CM | POA: Diagnosis not present

## 2015-06-25 DIAGNOSIS — R531 Weakness: Secondary | ICD-10-CM | POA: Diagnosis not present

## 2015-06-25 DIAGNOSIS — N186 End stage renal disease: Secondary | ICD-10-CM | POA: Diagnosis not present

## 2015-06-27 DIAGNOSIS — M79651 Pain in right thigh: Secondary | ICD-10-CM | POA: Diagnosis not present

## 2015-06-27 DIAGNOSIS — M79641 Pain in right hand: Secondary | ICD-10-CM | POA: Diagnosis not present

## 2015-06-27 DIAGNOSIS — M79621 Pain in right upper arm: Secondary | ICD-10-CM | POA: Diagnosis not present

## 2015-06-27 DIAGNOSIS — M79661 Pain in right lower leg: Secondary | ICD-10-CM | POA: Diagnosis not present

## 2015-06-27 DIAGNOSIS — M79631 Pain in right forearm: Secondary | ICD-10-CM | POA: Diagnosis not present

## 2015-06-27 DIAGNOSIS — M25511 Pain in right shoulder: Secondary | ICD-10-CM | POA: Diagnosis not present

## 2015-06-28 DIAGNOSIS — I509 Heart failure, unspecified: Secondary | ICD-10-CM | POA: Diagnosis not present

## 2015-06-28 DIAGNOSIS — N2581 Secondary hyperparathyroidism of renal origin: Secondary | ICD-10-CM | POA: Diagnosis not present

## 2015-06-28 DIAGNOSIS — J9600 Acute respiratory failure, unspecified whether with hypoxia or hypercapnia: Secondary | ICD-10-CM | POA: Diagnosis not present

## 2015-06-28 DIAGNOSIS — N186 End stage renal disease: Secondary | ICD-10-CM | POA: Diagnosis not present

## 2015-06-28 DIAGNOSIS — I639 Cerebral infarction, unspecified: Secondary | ICD-10-CM | POA: Diagnosis not present

## 2015-06-28 DIAGNOSIS — A419 Sepsis, unspecified organism: Secondary | ICD-10-CM | POA: Diagnosis not present

## 2015-06-28 DIAGNOSIS — Z992 Dependence on renal dialysis: Secondary | ICD-10-CM | POA: Diagnosis not present

## 2015-06-30 DIAGNOSIS — I82409 Acute embolism and thrombosis of unspecified deep veins of unspecified lower extremity: Secondary | ICD-10-CM | POA: Diagnosis not present

## 2015-06-30 DIAGNOSIS — E109 Type 1 diabetes mellitus without complications: Secondary | ICD-10-CM | POA: Diagnosis not present

## 2015-06-30 DIAGNOSIS — I639 Cerebral infarction, unspecified: Secondary | ICD-10-CM | POA: Diagnosis not present

## 2015-06-30 DIAGNOSIS — N186 End stage renal disease: Secondary | ICD-10-CM | POA: Diagnosis not present

## 2015-06-30 DIAGNOSIS — N2581 Secondary hyperparathyroidism of renal origin: Secondary | ICD-10-CM | POA: Diagnosis not present

## 2015-06-30 DIAGNOSIS — Z992 Dependence on renal dialysis: Secondary | ICD-10-CM | POA: Diagnosis not present

## 2015-06-30 DIAGNOSIS — A419 Sepsis, unspecified organism: Secondary | ICD-10-CM | POA: Diagnosis not present

## 2015-07-02 DIAGNOSIS — I639 Cerebral infarction, unspecified: Secondary | ICD-10-CM | POA: Diagnosis not present

## 2015-07-02 DIAGNOSIS — N2581 Secondary hyperparathyroidism of renal origin: Secondary | ICD-10-CM | POA: Diagnosis not present

## 2015-07-02 DIAGNOSIS — I509 Heart failure, unspecified: Secondary | ICD-10-CM | POA: Diagnosis not present

## 2015-07-02 DIAGNOSIS — N186 End stage renal disease: Secondary | ICD-10-CM | POA: Diagnosis not present

## 2015-07-02 DIAGNOSIS — Z992 Dependence on renal dialysis: Secondary | ICD-10-CM | POA: Diagnosis not present

## 2015-07-02 DIAGNOSIS — J9600 Acute respiratory failure, unspecified whether with hypoxia or hypercapnia: Secondary | ICD-10-CM | POA: Diagnosis not present

## 2015-07-02 DIAGNOSIS — A419 Sepsis, unspecified organism: Secondary | ICD-10-CM | POA: Diagnosis not present

## 2015-07-05 DIAGNOSIS — I639 Cerebral infarction, unspecified: Secondary | ICD-10-CM | POA: Diagnosis not present

## 2015-07-05 DIAGNOSIS — Z992 Dependence on renal dialysis: Secondary | ICD-10-CM | POA: Diagnosis not present

## 2015-07-05 DIAGNOSIS — A419 Sepsis, unspecified organism: Secondary | ICD-10-CM | POA: Diagnosis not present

## 2015-07-05 DIAGNOSIS — N2581 Secondary hyperparathyroidism of renal origin: Secondary | ICD-10-CM | POA: Diagnosis not present

## 2015-07-05 DIAGNOSIS — N186 End stage renal disease: Secondary | ICD-10-CM | POA: Diagnosis not present

## 2015-07-05 DIAGNOSIS — I82409 Acute embolism and thrombosis of unspecified deep veins of unspecified lower extremity: Secondary | ICD-10-CM | POA: Diagnosis not present

## 2015-07-05 DIAGNOSIS — J9600 Acute respiratory failure, unspecified whether with hypoxia or hypercapnia: Secondary | ICD-10-CM | POA: Diagnosis not present

## 2015-07-07 DIAGNOSIS — N2581 Secondary hyperparathyroidism of renal origin: Secondary | ICD-10-CM | POA: Diagnosis not present

## 2015-07-07 DIAGNOSIS — I639 Cerebral infarction, unspecified: Secondary | ICD-10-CM | POA: Diagnosis not present

## 2015-07-07 DIAGNOSIS — J9600 Acute respiratory failure, unspecified whether with hypoxia or hypercapnia: Secondary | ICD-10-CM | POA: Diagnosis not present

## 2015-07-07 DIAGNOSIS — Z992 Dependence on renal dialysis: Secondary | ICD-10-CM | POA: Diagnosis not present

## 2015-07-07 DIAGNOSIS — N186 End stage renal disease: Secondary | ICD-10-CM | POA: Diagnosis not present

## 2015-07-07 DIAGNOSIS — A419 Sepsis, unspecified organism: Secondary | ICD-10-CM | POA: Diagnosis not present

## 2015-07-07 DIAGNOSIS — I509 Heart failure, unspecified: Secondary | ICD-10-CM | POA: Diagnosis not present

## 2015-07-08 DIAGNOSIS — R2232 Localized swelling, mass and lump, left upper limb: Secondary | ICD-10-CM | POA: Diagnosis not present

## 2015-07-08 DIAGNOSIS — Z882 Allergy status to sulfonamides status: Secondary | ICD-10-CM | POA: Diagnosis not present

## 2015-07-08 DIAGNOSIS — T8249XA Other complication of vascular dialysis catheter, initial encounter: Secondary | ICD-10-CM | POA: Diagnosis not present

## 2015-07-08 DIAGNOSIS — D649 Anemia, unspecified: Secondary | ICD-10-CM | POA: Diagnosis not present

## 2015-07-08 DIAGNOSIS — Z91040 Latex allergy status: Secondary | ICD-10-CM | POA: Diagnosis not present

## 2015-07-08 DIAGNOSIS — K219 Gastro-esophageal reflux disease without esophagitis: Secondary | ICD-10-CM | POA: Diagnosis not present

## 2015-07-08 DIAGNOSIS — I509 Heart failure, unspecified: Secondary | ICD-10-CM | POA: Diagnosis not present

## 2015-07-08 DIAGNOSIS — Z880 Allergy status to penicillin: Secondary | ICD-10-CM | POA: Diagnosis not present

## 2015-07-08 DIAGNOSIS — M79622 Pain in left upper arm: Secondary | ICD-10-CM | POA: Diagnosis not present

## 2015-07-08 DIAGNOSIS — L03114 Cellulitis of left upper limb: Secondary | ICD-10-CM | POA: Diagnosis not present

## 2015-07-08 DIAGNOSIS — I120 Hypertensive chronic kidney disease with stage 5 chronic kidney disease or end stage renal disease: Secondary | ICD-10-CM | POA: Diagnosis not present

## 2015-07-08 DIAGNOSIS — G4730 Sleep apnea, unspecified: Secondary | ICD-10-CM | POA: Diagnosis not present

## 2015-07-08 DIAGNOSIS — N186 End stage renal disease: Secondary | ICD-10-CM | POA: Diagnosis not present

## 2015-07-08 DIAGNOSIS — F329 Major depressive disorder, single episode, unspecified: Secondary | ICD-10-CM | POA: Diagnosis not present

## 2015-07-08 DIAGNOSIS — Z794 Long term (current) use of insulin: Secondary | ICD-10-CM | POA: Diagnosis not present

## 2015-07-08 DIAGNOSIS — Z87891 Personal history of nicotine dependence: Secondary | ICD-10-CM | POA: Diagnosis not present

## 2015-07-08 DIAGNOSIS — I69851 Hemiplegia and hemiparesis following other cerebrovascular disease affecting right dominant side: Secondary | ICD-10-CM | POA: Diagnosis not present

## 2015-07-08 DIAGNOSIS — Z8701 Personal history of pneumonia (recurrent): Secondary | ICD-10-CM | POA: Diagnosis not present

## 2015-07-08 DIAGNOSIS — I252 Old myocardial infarction: Secondary | ICD-10-CM | POA: Diagnosis not present

## 2015-07-08 DIAGNOSIS — E119 Type 2 diabetes mellitus without complications: Secondary | ICD-10-CM | POA: Diagnosis not present

## 2015-07-08 DIAGNOSIS — R569 Unspecified convulsions: Secondary | ICD-10-CM | POA: Diagnosis not present

## 2015-07-09 DIAGNOSIS — N186 End stage renal disease: Secondary | ICD-10-CM | POA: Diagnosis not present

## 2015-07-09 DIAGNOSIS — N2581 Secondary hyperparathyroidism of renal origin: Secondary | ICD-10-CM | POA: Diagnosis not present

## 2015-07-09 DIAGNOSIS — I5032 Chronic diastolic (congestive) heart failure: Secondary | ICD-10-CM | POA: Diagnosis not present

## 2015-07-09 DIAGNOSIS — A419 Sepsis, unspecified organism: Secondary | ICD-10-CM | POA: Diagnosis not present

## 2015-07-09 DIAGNOSIS — Z992 Dependence on renal dialysis: Secondary | ICD-10-CM | POA: Diagnosis not present

## 2015-07-09 DIAGNOSIS — E119 Type 2 diabetes mellitus without complications: Secondary | ICD-10-CM | POA: Diagnosis not present

## 2015-07-09 DIAGNOSIS — N179 Acute kidney failure, unspecified: Secondary | ICD-10-CM | POA: Diagnosis not present

## 2015-07-09 DIAGNOSIS — I639 Cerebral infarction, unspecified: Secondary | ICD-10-CM | POA: Diagnosis not present

## 2015-07-11 DIAGNOSIS — N186 End stage renal disease: Secondary | ICD-10-CM | POA: Diagnosis not present

## 2015-07-11 DIAGNOSIS — R0683 Snoring: Secondary | ICD-10-CM | POA: Diagnosis not present

## 2015-07-11 DIAGNOSIS — G4761 Periodic limb movement disorder: Secondary | ICD-10-CM | POA: Diagnosis not present

## 2015-07-12 DIAGNOSIS — A419 Sepsis, unspecified organism: Secondary | ICD-10-CM | POA: Diagnosis not present

## 2015-07-12 DIAGNOSIS — I639 Cerebral infarction, unspecified: Secondary | ICD-10-CM | POA: Diagnosis not present

## 2015-07-12 DIAGNOSIS — N186 End stage renal disease: Secondary | ICD-10-CM | POA: Diagnosis not present

## 2015-07-12 DIAGNOSIS — I82409 Acute embolism and thrombosis of unspecified deep veins of unspecified lower extremity: Secondary | ICD-10-CM | POA: Diagnosis not present

## 2015-07-12 DIAGNOSIS — Z992 Dependence on renal dialysis: Secondary | ICD-10-CM | POA: Diagnosis not present

## 2015-07-12 DIAGNOSIS — E119 Type 2 diabetes mellitus without complications: Secondary | ICD-10-CM | POA: Diagnosis not present

## 2015-07-12 DIAGNOSIS — E109 Type 1 diabetes mellitus without complications: Secondary | ICD-10-CM | POA: Diagnosis not present

## 2015-07-12 DIAGNOSIS — N2581 Secondary hyperparathyroidism of renal origin: Secondary | ICD-10-CM | POA: Diagnosis not present

## 2015-07-14 DIAGNOSIS — A419 Sepsis, unspecified organism: Secondary | ICD-10-CM | POA: Diagnosis not present

## 2015-07-14 DIAGNOSIS — J9600 Acute respiratory failure, unspecified whether with hypoxia or hypercapnia: Secondary | ICD-10-CM | POA: Diagnosis not present

## 2015-07-14 DIAGNOSIS — Z992 Dependence on renal dialysis: Secondary | ICD-10-CM | POA: Diagnosis not present

## 2015-07-14 DIAGNOSIS — R112 Nausea with vomiting, unspecified: Secondary | ICD-10-CM | POA: Diagnosis not present

## 2015-07-14 DIAGNOSIS — E109 Type 1 diabetes mellitus without complications: Secondary | ICD-10-CM | POA: Diagnosis not present

## 2015-07-14 DIAGNOSIS — N186 End stage renal disease: Secondary | ICD-10-CM | POA: Diagnosis not present

## 2015-07-14 DIAGNOSIS — N2581 Secondary hyperparathyroidism of renal origin: Secondary | ICD-10-CM | POA: Diagnosis not present

## 2015-07-14 DIAGNOSIS — L03119 Cellulitis of unspecified part of limb: Secondary | ICD-10-CM | POA: Diagnosis not present

## 2015-07-14 DIAGNOSIS — I739 Peripheral vascular disease, unspecified: Secondary | ICD-10-CM | POA: Diagnosis not present

## 2015-07-16 DIAGNOSIS — Z992 Dependence on renal dialysis: Secondary | ICD-10-CM | POA: Diagnosis not present

## 2015-07-16 DIAGNOSIS — I509 Heart failure, unspecified: Secondary | ICD-10-CM | POA: Diagnosis not present

## 2015-07-16 DIAGNOSIS — I639 Cerebral infarction, unspecified: Secondary | ICD-10-CM | POA: Diagnosis not present

## 2015-07-16 DIAGNOSIS — N2581 Secondary hyperparathyroidism of renal origin: Secondary | ICD-10-CM | POA: Diagnosis not present

## 2015-07-16 DIAGNOSIS — R112 Nausea with vomiting, unspecified: Secondary | ICD-10-CM | POA: Diagnosis not present

## 2015-07-16 DIAGNOSIS — J9600 Acute respiratory failure, unspecified whether with hypoxia or hypercapnia: Secondary | ICD-10-CM | POA: Diagnosis not present

## 2015-07-16 DIAGNOSIS — A419 Sepsis, unspecified organism: Secondary | ICD-10-CM | POA: Diagnosis not present

## 2015-07-16 DIAGNOSIS — L03119 Cellulitis of unspecified part of limb: Secondary | ICD-10-CM | POA: Diagnosis not present

## 2015-07-16 DIAGNOSIS — N186 End stage renal disease: Secondary | ICD-10-CM | POA: Diagnosis not present

## 2015-07-19 DIAGNOSIS — Z992 Dependence on renal dialysis: Secondary | ICD-10-CM | POA: Diagnosis not present

## 2015-07-19 DIAGNOSIS — J9600 Acute respiratory failure, unspecified whether with hypoxia or hypercapnia: Secondary | ICD-10-CM | POA: Diagnosis not present

## 2015-07-19 DIAGNOSIS — N2581 Secondary hyperparathyroidism of renal origin: Secondary | ICD-10-CM | POA: Diagnosis not present

## 2015-07-19 DIAGNOSIS — I509 Heart failure, unspecified: Secondary | ICD-10-CM | POA: Diagnosis not present

## 2015-07-19 DIAGNOSIS — N186 End stage renal disease: Secondary | ICD-10-CM | POA: Diagnosis not present

## 2015-07-19 DIAGNOSIS — L03119 Cellulitis of unspecified part of limb: Secondary | ICD-10-CM | POA: Diagnosis not present

## 2015-07-19 DIAGNOSIS — A419 Sepsis, unspecified organism: Secondary | ICD-10-CM | POA: Diagnosis not present

## 2015-07-19 DIAGNOSIS — R112 Nausea with vomiting, unspecified: Secondary | ICD-10-CM | POA: Diagnosis not present

## 2015-07-19 DIAGNOSIS — I639 Cerebral infarction, unspecified: Secondary | ICD-10-CM | POA: Diagnosis not present

## 2015-07-21 DIAGNOSIS — I639 Cerebral infarction, unspecified: Secondary | ICD-10-CM | POA: Diagnosis not present

## 2015-07-21 DIAGNOSIS — J9600 Acute respiratory failure, unspecified whether with hypoxia or hypercapnia: Secondary | ICD-10-CM | POA: Diagnosis not present

## 2015-07-21 DIAGNOSIS — Z992 Dependence on renal dialysis: Secondary | ICD-10-CM | POA: Diagnosis not present

## 2015-07-21 DIAGNOSIS — A419 Sepsis, unspecified organism: Secondary | ICD-10-CM | POA: Diagnosis not present

## 2015-07-21 DIAGNOSIS — I509 Heart failure, unspecified: Secondary | ICD-10-CM | POA: Diagnosis not present

## 2015-07-21 DIAGNOSIS — R112 Nausea with vomiting, unspecified: Secondary | ICD-10-CM | POA: Diagnosis not present

## 2015-07-21 DIAGNOSIS — N186 End stage renal disease: Secondary | ICD-10-CM | POA: Diagnosis not present

## 2015-07-21 DIAGNOSIS — L03119 Cellulitis of unspecified part of limb: Secondary | ICD-10-CM | POA: Diagnosis not present

## 2015-07-21 DIAGNOSIS — N2581 Secondary hyperparathyroidism of renal origin: Secondary | ICD-10-CM | POA: Diagnosis not present

## 2015-07-22 DIAGNOSIS — L03114 Cellulitis of left upper limb: Secondary | ICD-10-CM | POA: Diagnosis not present

## 2015-07-22 DIAGNOSIS — M7989 Other specified soft tissue disorders: Secondary | ICD-10-CM | POA: Diagnosis not present

## 2015-07-22 DIAGNOSIS — R2242 Localized swelling, mass and lump, left lower limb: Secondary | ICD-10-CM | POA: Diagnosis not present

## 2015-07-22 DIAGNOSIS — T82898A Other specified complication of vascular prosthetic devices, implants and grafts, initial encounter: Secondary | ICD-10-CM | POA: Diagnosis not present

## 2015-07-22 DIAGNOSIS — N186 End stage renal disease: Secondary | ICD-10-CM | POA: Diagnosis not present

## 2015-07-23 DIAGNOSIS — I639 Cerebral infarction, unspecified: Secondary | ICD-10-CM | POA: Diagnosis not present

## 2015-07-23 DIAGNOSIS — Z992 Dependence on renal dialysis: Secondary | ICD-10-CM | POA: Diagnosis not present

## 2015-07-23 DIAGNOSIS — A419 Sepsis, unspecified organism: Secondary | ICD-10-CM | POA: Diagnosis not present

## 2015-07-23 DIAGNOSIS — N186 End stage renal disease: Secondary | ICD-10-CM | POA: Diagnosis not present

## 2015-07-23 DIAGNOSIS — R112 Nausea with vomiting, unspecified: Secondary | ICD-10-CM | POA: Diagnosis not present

## 2015-07-23 DIAGNOSIS — J9600 Acute respiratory failure, unspecified whether with hypoxia or hypercapnia: Secondary | ICD-10-CM | POA: Diagnosis not present

## 2015-07-23 DIAGNOSIS — I509 Heart failure, unspecified: Secondary | ICD-10-CM | POA: Diagnosis not present

## 2015-07-23 DIAGNOSIS — N2581 Secondary hyperparathyroidism of renal origin: Secondary | ICD-10-CM | POA: Diagnosis not present

## 2015-07-23 DIAGNOSIS — L03119 Cellulitis of unspecified part of limb: Secondary | ICD-10-CM | POA: Diagnosis not present

## 2015-07-26 DIAGNOSIS — I10 Essential (primary) hypertension: Secondary | ICD-10-CM | POA: Diagnosis not present

## 2015-07-26 DIAGNOSIS — A419 Sepsis, unspecified organism: Secondary | ICD-10-CM | POA: Diagnosis not present

## 2015-07-26 DIAGNOSIS — Z992 Dependence on renal dialysis: Secondary | ICD-10-CM | POA: Diagnosis not present

## 2015-07-26 DIAGNOSIS — E119 Type 2 diabetes mellitus without complications: Secondary | ICD-10-CM | POA: Diagnosis not present

## 2015-07-26 DIAGNOSIS — L03119 Cellulitis of unspecified part of limb: Secondary | ICD-10-CM | POA: Diagnosis not present

## 2015-07-26 DIAGNOSIS — R112 Nausea with vomiting, unspecified: Secondary | ICD-10-CM | POA: Diagnosis not present

## 2015-07-26 DIAGNOSIS — J9600 Acute respiratory failure, unspecified whether with hypoxia or hypercapnia: Secondary | ICD-10-CM | POA: Diagnosis not present

## 2015-07-26 DIAGNOSIS — N2581 Secondary hyperparathyroidism of renal origin: Secondary | ICD-10-CM | POA: Diagnosis not present

## 2015-07-26 DIAGNOSIS — N186 End stage renal disease: Secondary | ICD-10-CM | POA: Diagnosis not present

## 2015-07-28 DIAGNOSIS — I639 Cerebral infarction, unspecified: Secondary | ICD-10-CM | POA: Diagnosis not present

## 2015-07-28 DIAGNOSIS — N186 End stage renal disease: Secondary | ICD-10-CM | POA: Diagnosis not present

## 2015-07-28 DIAGNOSIS — I82409 Acute embolism and thrombosis of unspecified deep veins of unspecified lower extremity: Secondary | ICD-10-CM | POA: Diagnosis not present

## 2015-07-28 DIAGNOSIS — L03119 Cellulitis of unspecified part of limb: Secondary | ICD-10-CM | POA: Diagnosis not present

## 2015-07-28 DIAGNOSIS — A419 Sepsis, unspecified organism: Secondary | ICD-10-CM | POA: Diagnosis not present

## 2015-07-28 DIAGNOSIS — R112 Nausea with vomiting, unspecified: Secondary | ICD-10-CM | POA: Diagnosis not present

## 2015-07-28 DIAGNOSIS — N2581 Secondary hyperparathyroidism of renal origin: Secondary | ICD-10-CM | POA: Diagnosis not present

## 2015-07-28 DIAGNOSIS — E119 Type 2 diabetes mellitus without complications: Secondary | ICD-10-CM | POA: Diagnosis not present

## 2015-07-28 DIAGNOSIS — J9600 Acute respiratory failure, unspecified whether with hypoxia or hypercapnia: Secondary | ICD-10-CM | POA: Diagnosis not present

## 2015-07-28 DIAGNOSIS — Z992 Dependence on renal dialysis: Secondary | ICD-10-CM | POA: Diagnosis not present

## 2015-07-30 DIAGNOSIS — A419 Sepsis, unspecified organism: Secondary | ICD-10-CM | POA: Diagnosis not present

## 2015-07-30 DIAGNOSIS — J9600 Acute respiratory failure, unspecified whether with hypoxia or hypercapnia: Secondary | ICD-10-CM | POA: Diagnosis not present

## 2015-07-30 DIAGNOSIS — R112 Nausea with vomiting, unspecified: Secondary | ICD-10-CM | POA: Diagnosis not present

## 2015-07-30 DIAGNOSIS — N2581 Secondary hyperparathyroidism of renal origin: Secondary | ICD-10-CM | POA: Diagnosis not present

## 2015-07-30 DIAGNOSIS — I639 Cerebral infarction, unspecified: Secondary | ICD-10-CM | POA: Diagnosis not present

## 2015-07-30 DIAGNOSIS — N186 End stage renal disease: Secondary | ICD-10-CM | POA: Diagnosis not present

## 2015-07-30 DIAGNOSIS — L03119 Cellulitis of unspecified part of limb: Secondary | ICD-10-CM | POA: Diagnosis not present

## 2015-07-30 DIAGNOSIS — I509 Heart failure, unspecified: Secondary | ICD-10-CM | POA: Diagnosis not present

## 2015-07-30 DIAGNOSIS — Z992 Dependence on renal dialysis: Secondary | ICD-10-CM | POA: Diagnosis not present

## 2015-08-01 DIAGNOSIS — M7989 Other specified soft tissue disorders: Secondary | ICD-10-CM | POA: Diagnosis not present

## 2015-08-01 DIAGNOSIS — N186 End stage renal disease: Secondary | ICD-10-CM | POA: Diagnosis not present

## 2015-08-01 DIAGNOSIS — Z4889 Encounter for other specified surgical aftercare: Secondary | ICD-10-CM | POA: Diagnosis not present

## 2015-08-01 DIAGNOSIS — L03114 Cellulitis of left upper limb: Secondary | ICD-10-CM | POA: Diagnosis not present

## 2015-08-02 DIAGNOSIS — J9600 Acute respiratory failure, unspecified whether with hypoxia or hypercapnia: Secondary | ICD-10-CM | POA: Diagnosis not present

## 2015-08-02 DIAGNOSIS — L03119 Cellulitis of unspecified part of limb: Secondary | ICD-10-CM | POA: Diagnosis not present

## 2015-08-02 DIAGNOSIS — N2581 Secondary hyperparathyroidism of renal origin: Secondary | ICD-10-CM | POA: Diagnosis not present

## 2015-08-02 DIAGNOSIS — A419 Sepsis, unspecified organism: Secondary | ICD-10-CM | POA: Diagnosis not present

## 2015-08-02 DIAGNOSIS — I639 Cerebral infarction, unspecified: Secondary | ICD-10-CM | POA: Diagnosis not present

## 2015-08-02 DIAGNOSIS — N186 End stage renal disease: Secondary | ICD-10-CM | POA: Diagnosis not present

## 2015-08-02 DIAGNOSIS — Z992 Dependence on renal dialysis: Secondary | ICD-10-CM | POA: Diagnosis not present

## 2015-08-02 DIAGNOSIS — E119 Type 2 diabetes mellitus without complications: Secondary | ICD-10-CM | POA: Diagnosis not present

## 2015-08-02 DIAGNOSIS — R112 Nausea with vomiting, unspecified: Secondary | ICD-10-CM | POA: Diagnosis not present

## 2015-08-04 DIAGNOSIS — J9600 Acute respiratory failure, unspecified whether with hypoxia or hypercapnia: Secondary | ICD-10-CM | POA: Diagnosis not present

## 2015-08-04 DIAGNOSIS — N186 End stage renal disease: Secondary | ICD-10-CM | POA: Diagnosis not present

## 2015-08-04 DIAGNOSIS — I509 Heart failure, unspecified: Secondary | ICD-10-CM | POA: Diagnosis not present

## 2015-08-04 DIAGNOSIS — Z992 Dependence on renal dialysis: Secondary | ICD-10-CM | POA: Diagnosis not present

## 2015-08-04 DIAGNOSIS — I639 Cerebral infarction, unspecified: Secondary | ICD-10-CM | POA: Diagnosis not present

## 2015-08-04 DIAGNOSIS — A419 Sepsis, unspecified organism: Secondary | ICD-10-CM | POA: Diagnosis not present

## 2015-08-04 DIAGNOSIS — N2581 Secondary hyperparathyroidism of renal origin: Secondary | ICD-10-CM | POA: Diagnosis not present

## 2015-08-04 DIAGNOSIS — R112 Nausea with vomiting, unspecified: Secondary | ICD-10-CM | POA: Diagnosis not present

## 2015-08-04 DIAGNOSIS — L03119 Cellulitis of unspecified part of limb: Secondary | ICD-10-CM | POA: Diagnosis not present

## 2015-08-05 DIAGNOSIS — M6281 Muscle weakness (generalized): Secondary | ICD-10-CM | POA: Diagnosis not present

## 2015-08-05 DIAGNOSIS — Z794 Long term (current) use of insulin: Secondary | ICD-10-CM | POA: Diagnosis not present

## 2015-08-05 DIAGNOSIS — D649 Anemia, unspecified: Secondary | ICD-10-CM | POA: Diagnosis not present

## 2015-08-05 DIAGNOSIS — T82898A Other specified complication of vascular prosthetic devices, implants and grafts, initial encounter: Secondary | ICD-10-CM | POA: Diagnosis not present

## 2015-08-05 DIAGNOSIS — Z91040 Latex allergy status: Secondary | ICD-10-CM | POA: Diagnosis not present

## 2015-08-05 DIAGNOSIS — I871 Compression of vein: Secondary | ICD-10-CM | POA: Diagnosis not present

## 2015-08-05 DIAGNOSIS — Z86718 Personal history of other venous thrombosis and embolism: Secondary | ICD-10-CM | POA: Diagnosis not present

## 2015-08-05 DIAGNOSIS — Z880 Allergy status to penicillin: Secondary | ICD-10-CM | POA: Diagnosis not present

## 2015-08-05 DIAGNOSIS — Z992 Dependence on renal dialysis: Secondary | ICD-10-CM | POA: Diagnosis not present

## 2015-08-05 DIAGNOSIS — F329 Major depressive disorder, single episode, unspecified: Secondary | ICD-10-CM | POA: Diagnosis not present

## 2015-08-05 DIAGNOSIS — I69828 Other speech and language deficits following other cerebrovascular disease: Secondary | ICD-10-CM | POA: Diagnosis not present

## 2015-08-05 DIAGNOSIS — Z87891 Personal history of nicotine dependence: Secondary | ICD-10-CM | POA: Diagnosis not present

## 2015-08-05 DIAGNOSIS — I509 Heart failure, unspecified: Secondary | ICD-10-CM | POA: Diagnosis not present

## 2015-08-05 DIAGNOSIS — T82858A Stenosis of vascular prosthetic devices, implants and grafts, initial encounter: Secondary | ICD-10-CM | POA: Diagnosis not present

## 2015-08-05 DIAGNOSIS — I120 Hypertensive chronic kidney disease with stage 5 chronic kidney disease or end stage renal disease: Secondary | ICD-10-CM | POA: Diagnosis not present

## 2015-08-05 DIAGNOSIS — Z882 Allergy status to sulfonamides status: Secondary | ICD-10-CM | POA: Diagnosis not present

## 2015-08-05 DIAGNOSIS — K219 Gastro-esophageal reflux disease without esophagitis: Secondary | ICD-10-CM | POA: Diagnosis not present

## 2015-08-05 DIAGNOSIS — E119 Type 2 diabetes mellitus without complications: Secondary | ICD-10-CM | POA: Diagnosis not present

## 2015-08-05 DIAGNOSIS — I440 Atrioventricular block, first degree: Secondary | ICD-10-CM | POA: Diagnosis not present

## 2015-08-05 DIAGNOSIS — N186 End stage renal disease: Secondary | ICD-10-CM | POA: Diagnosis not present

## 2015-08-05 DIAGNOSIS — R569 Unspecified convulsions: Secondary | ICD-10-CM | POA: Diagnosis not present

## 2015-08-05 DIAGNOSIS — G4730 Sleep apnea, unspecified: Secondary | ICD-10-CM | POA: Diagnosis not present

## 2015-08-06 DIAGNOSIS — I639 Cerebral infarction, unspecified: Secondary | ICD-10-CM | POA: Diagnosis not present

## 2015-08-06 DIAGNOSIS — L03119 Cellulitis of unspecified part of limb: Secondary | ICD-10-CM | POA: Diagnosis not present

## 2015-08-06 DIAGNOSIS — A419 Sepsis, unspecified organism: Secondary | ICD-10-CM | POA: Diagnosis not present

## 2015-08-06 DIAGNOSIS — Z992 Dependence on renal dialysis: Secondary | ICD-10-CM | POA: Diagnosis not present

## 2015-08-06 DIAGNOSIS — E119 Type 2 diabetes mellitus without complications: Secondary | ICD-10-CM | POA: Diagnosis not present

## 2015-08-06 DIAGNOSIS — N2581 Secondary hyperparathyroidism of renal origin: Secondary | ICD-10-CM | POA: Diagnosis not present

## 2015-08-06 DIAGNOSIS — I82409 Acute embolism and thrombosis of unspecified deep veins of unspecified lower extremity: Secondary | ICD-10-CM | POA: Diagnosis not present

## 2015-08-06 DIAGNOSIS — N186 End stage renal disease: Secondary | ICD-10-CM | POA: Diagnosis not present

## 2015-08-06 DIAGNOSIS — R112 Nausea with vomiting, unspecified: Secondary | ICD-10-CM | POA: Diagnosis not present

## 2015-08-08 DIAGNOSIS — N186 End stage renal disease: Secondary | ICD-10-CM | POA: Diagnosis not present

## 2015-08-09 DIAGNOSIS — I639 Cerebral infarction, unspecified: Secondary | ICD-10-CM | POA: Diagnosis not present

## 2015-08-09 DIAGNOSIS — Z992 Dependence on renal dialysis: Secondary | ICD-10-CM | POA: Diagnosis not present

## 2015-08-09 DIAGNOSIS — L03119 Cellulitis of unspecified part of limb: Secondary | ICD-10-CM | POA: Diagnosis not present

## 2015-08-09 DIAGNOSIS — I82409 Acute embolism and thrombosis of unspecified deep veins of unspecified lower extremity: Secondary | ICD-10-CM | POA: Diagnosis not present

## 2015-08-09 DIAGNOSIS — A419 Sepsis, unspecified organism: Secondary | ICD-10-CM | POA: Diagnosis not present

## 2015-08-09 DIAGNOSIS — R531 Weakness: Secondary | ICD-10-CM | POA: Diagnosis not present

## 2015-08-09 DIAGNOSIS — N2581 Secondary hyperparathyroidism of renal origin: Secondary | ICD-10-CM | POA: Diagnosis not present

## 2015-08-09 DIAGNOSIS — R112 Nausea with vomiting, unspecified: Secondary | ICD-10-CM | POA: Diagnosis not present

## 2015-08-09 DIAGNOSIS — N186 End stage renal disease: Secondary | ICD-10-CM | POA: Diagnosis not present

## 2015-08-09 DIAGNOSIS — J9600 Acute respiratory failure, unspecified whether with hypoxia or hypercapnia: Secondary | ICD-10-CM | POA: Diagnosis not present

## 2015-08-11 DIAGNOSIS — L03119 Cellulitis of unspecified part of limb: Secondary | ICD-10-CM | POA: Diagnosis not present

## 2015-08-11 DIAGNOSIS — I639 Cerebral infarction, unspecified: Secondary | ICD-10-CM | POA: Diagnosis not present

## 2015-08-11 DIAGNOSIS — N2581 Secondary hyperparathyroidism of renal origin: Secondary | ICD-10-CM | POA: Diagnosis not present

## 2015-08-11 DIAGNOSIS — E109 Type 1 diabetes mellitus without complications: Secondary | ICD-10-CM | POA: Diagnosis not present

## 2015-08-11 DIAGNOSIS — R112 Nausea with vomiting, unspecified: Secondary | ICD-10-CM | POA: Diagnosis not present

## 2015-08-11 DIAGNOSIS — A419 Sepsis, unspecified organism: Secondary | ICD-10-CM | POA: Diagnosis not present

## 2015-08-11 DIAGNOSIS — I82409 Acute embolism and thrombosis of unspecified deep veins of unspecified lower extremity: Secondary | ICD-10-CM | POA: Diagnosis not present

## 2015-08-11 DIAGNOSIS — Z992 Dependence on renal dialysis: Secondary | ICD-10-CM | POA: Diagnosis not present

## 2015-08-11 DIAGNOSIS — N186 End stage renal disease: Secondary | ICD-10-CM | POA: Diagnosis not present

## 2015-08-17 DIAGNOSIS — N186 End stage renal disease: Secondary | ICD-10-CM | POA: Diagnosis present

## 2015-08-17 DIAGNOSIS — R9431 Abnormal electrocardiogram [ECG] [EKG]: Secondary | ICD-10-CM | POA: Diagnosis not present

## 2015-08-17 DIAGNOSIS — Z91040 Latex allergy status: Secondary | ICD-10-CM | POA: Diagnosis not present

## 2015-08-17 DIAGNOSIS — K5900 Constipation, unspecified: Secondary | ICD-10-CM | POA: Diagnosis present

## 2015-08-17 DIAGNOSIS — I272 Other secondary pulmonary hypertension: Secondary | ICD-10-CM | POA: Diagnosis present

## 2015-08-17 DIAGNOSIS — T82858A Stenosis of vascular prosthetic devices, implants and grafts, initial encounter: Secondary | ICD-10-CM | POA: Diagnosis present

## 2015-08-17 DIAGNOSIS — N19 Unspecified kidney failure: Secondary | ICD-10-CM | POA: Diagnosis not present

## 2015-08-17 DIAGNOSIS — Z9119 Patient's noncompliance with other medical treatment and regimen: Secondary | ICD-10-CM | POA: Diagnosis not present

## 2015-08-17 DIAGNOSIS — I4891 Unspecified atrial fibrillation: Secondary | ICD-10-CM | POA: Diagnosis not present

## 2015-08-17 DIAGNOSIS — I480 Paroxysmal atrial fibrillation: Secondary | ICD-10-CM | POA: Diagnosis present

## 2015-08-17 DIAGNOSIS — Z87891 Personal history of nicotine dependence: Secondary | ICD-10-CM | POA: Diagnosis not present

## 2015-08-17 DIAGNOSIS — I120 Hypertensive chronic kidney disease with stage 5 chronic kidney disease or end stage renal disease: Secondary | ICD-10-CM | POA: Diagnosis present

## 2015-08-17 DIAGNOSIS — N2581 Secondary hyperparathyroidism of renal origin: Secondary | ICD-10-CM | POA: Diagnosis present

## 2015-08-17 DIAGNOSIS — I6990 Unspecified sequelae of unspecified cerebrovascular disease: Secondary | ICD-10-CM | POA: Diagnosis not present

## 2015-08-17 DIAGNOSIS — I509 Heart failure, unspecified: Secondary | ICD-10-CM | POA: Diagnosis not present

## 2015-08-17 DIAGNOSIS — I129 Hypertensive chronic kidney disease with stage 1 through stage 4 chronic kidney disease, or unspecified chronic kidney disease: Secondary | ICD-10-CM | POA: Diagnosis not present

## 2015-08-17 DIAGNOSIS — N189 Chronic kidney disease, unspecified: Secondary | ICD-10-CM | POA: Diagnosis not present

## 2015-08-17 DIAGNOSIS — Z880 Allergy status to penicillin: Secondary | ICD-10-CM | POA: Diagnosis not present

## 2015-08-17 DIAGNOSIS — I959 Hypotension, unspecified: Secondary | ICD-10-CM | POA: Diagnosis not present

## 2015-08-17 DIAGNOSIS — Z794 Long term (current) use of insulin: Secondary | ICD-10-CM | POA: Diagnosis not present

## 2015-08-17 DIAGNOSIS — Z8673 Personal history of transient ischemic attack (TIA), and cerebral infarction without residual deficits: Secondary | ICD-10-CM | POA: Diagnosis not present

## 2015-08-17 DIAGNOSIS — R1111 Vomiting without nausea: Secondary | ICD-10-CM | POA: Diagnosis not present

## 2015-08-17 DIAGNOSIS — M6281 Muscle weakness (generalized): Secondary | ICD-10-CM | POA: Diagnosis not present

## 2015-08-17 DIAGNOSIS — R392 Extrarenal uremia: Secondary | ICD-10-CM | POA: Diagnosis not present

## 2015-08-17 DIAGNOSIS — R569 Unspecified convulsions: Secondary | ICD-10-CM | POA: Diagnosis not present

## 2015-08-17 DIAGNOSIS — R112 Nausea with vomiting, unspecified: Secondary | ICD-10-CM | POA: Diagnosis present

## 2015-08-17 DIAGNOSIS — Z881 Allergy status to other antibiotic agents status: Secondary | ICD-10-CM | POA: Diagnosis not present

## 2015-08-17 DIAGNOSIS — E1122 Type 2 diabetes mellitus with diabetic chronic kidney disease: Secondary | ICD-10-CM | POA: Diagnosis present

## 2015-08-17 DIAGNOSIS — I10 Essential (primary) hypertension: Secondary | ICD-10-CM | POA: Diagnosis not present

## 2015-08-17 DIAGNOSIS — I080 Rheumatic disorders of both mitral and aortic valves: Secondary | ICD-10-CM | POA: Diagnosis present

## 2015-08-17 DIAGNOSIS — G3184 Mild cognitive impairment, so stated: Secondary | ICD-10-CM | POA: Diagnosis present

## 2015-08-17 DIAGNOSIS — Z7982 Long term (current) use of aspirin: Secondary | ICD-10-CM | POA: Diagnosis not present

## 2015-08-17 DIAGNOSIS — R609 Edema, unspecified: Secondary | ICD-10-CM | POA: Diagnosis not present

## 2015-08-17 DIAGNOSIS — G40909 Epilepsy, unspecified, not intractable, without status epilepticus: Secondary | ICD-10-CM | POA: Diagnosis present

## 2015-08-17 DIAGNOSIS — R001 Bradycardia, unspecified: Secondary | ICD-10-CM | POA: Diagnosis not present

## 2015-08-17 DIAGNOSIS — R600 Localized edema: Secondary | ICD-10-CM | POA: Diagnosis not present

## 2015-08-17 DIAGNOSIS — D638 Anemia in other chronic diseases classified elsewhere: Secondary | ICD-10-CM | POA: Diagnosis present

## 2015-08-17 DIAGNOSIS — Y832 Surgical operation with anastomosis, bypass or graft as the cause of abnormal reaction of the patient, or of later complication, without mention of misadventure at the time of the procedure: Secondary | ICD-10-CM | POA: Diagnosis not present

## 2015-08-17 DIAGNOSIS — I82622 Acute embolism and thrombosis of deep veins of left upper extremity: Secondary | ICD-10-CM | POA: Diagnosis present

## 2015-08-17 DIAGNOSIS — Z992 Dependence on renal dialysis: Secondary | ICD-10-CM | POA: Diagnosis not present

## 2015-08-17 DIAGNOSIS — I499 Cardiac arrhythmia, unspecified: Secondary | ICD-10-CM | POA: Diagnosis not present

## 2015-08-17 DIAGNOSIS — R262 Difficulty in walking, not elsewhere classified: Secondary | ICD-10-CM | POA: Diagnosis not present

## 2015-08-17 DIAGNOSIS — T82868A Thrombosis of vascular prosthetic devices, implants and grafts, initial encounter: Secondary | ICD-10-CM | POA: Diagnosis not present

## 2015-08-17 DIAGNOSIS — R55 Syncope and collapse: Secondary | ICD-10-CM | POA: Diagnosis not present

## 2015-08-17 DIAGNOSIS — E119 Type 2 diabetes mellitus without complications: Secondary | ICD-10-CM | POA: Diagnosis not present

## 2015-08-17 DIAGNOSIS — G92 Toxic encephalopathy: Secondary | ICD-10-CM | POA: Diagnosis present

## 2015-08-17 DIAGNOSIS — Z79899 Other long term (current) drug therapy: Secondary | ICD-10-CM | POA: Diagnosis not present

## 2015-08-17 DIAGNOSIS — R4182 Altered mental status, unspecified: Secondary | ICD-10-CM | POA: Diagnosis not present

## 2015-08-23 DIAGNOSIS — Z23 Encounter for immunization: Secondary | ICD-10-CM | POA: Diagnosis not present

## 2015-08-23 DIAGNOSIS — Z4931 Encounter for adequacy testing for hemodialysis: Secondary | ICD-10-CM | POA: Diagnosis not present

## 2015-08-23 DIAGNOSIS — Z992 Dependence on renal dialysis: Secondary | ICD-10-CM | POA: Diagnosis not present

## 2015-08-23 DIAGNOSIS — I6990 Unspecified sequelae of unspecified cerebrovascular disease: Secondary | ICD-10-CM | POA: Diagnosis not present

## 2015-08-23 DIAGNOSIS — N19 Unspecified kidney failure: Secondary | ICD-10-CM | POA: Diagnosis not present

## 2015-08-23 DIAGNOSIS — I871 Compression of vein: Secondary | ICD-10-CM | POA: Diagnosis not present

## 2015-08-23 DIAGNOSIS — D631 Anemia in chronic kidney disease: Secondary | ICD-10-CM | POA: Diagnosis not present

## 2015-08-23 DIAGNOSIS — G9349 Other encephalopathy: Secondary | ICD-10-CM | POA: Diagnosis not present

## 2015-08-23 DIAGNOSIS — M6281 Muscle weakness (generalized): Secondary | ICD-10-CM | POA: Diagnosis not present

## 2015-08-23 DIAGNOSIS — I5020 Unspecified systolic (congestive) heart failure: Secondary | ICD-10-CM | POA: Diagnosis not present

## 2015-08-23 DIAGNOSIS — Z4889 Encounter for other specified surgical aftercare: Secondary | ICD-10-CM | POA: Diagnosis not present

## 2015-08-23 DIAGNOSIS — R488 Other symbolic dysfunctions: Secondary | ICD-10-CM | POA: Diagnosis not present

## 2015-08-23 DIAGNOSIS — M7989 Other specified soft tissue disorders: Secondary | ICD-10-CM | POA: Diagnosis not present

## 2015-08-23 DIAGNOSIS — T82898A Other specified complication of vascular prosthetic devices, implants and grafts, initial encounter: Secondary | ICD-10-CM | POA: Diagnosis not present

## 2015-08-23 DIAGNOSIS — K219 Gastro-esophageal reflux disease without esophagitis: Secondary | ICD-10-CM | POA: Diagnosis not present

## 2015-08-23 DIAGNOSIS — I132 Hypertensive heart and chronic kidney disease with heart failure and with stage 5 chronic kidney disease, or end stage renal disease: Secondary | ICD-10-CM | POA: Diagnosis not present

## 2015-08-23 DIAGNOSIS — D509 Iron deficiency anemia, unspecified: Secondary | ICD-10-CM | POA: Diagnosis not present

## 2015-08-23 DIAGNOSIS — E1122 Type 2 diabetes mellitus with diabetic chronic kidney disease: Secondary | ICD-10-CM | POA: Diagnosis not present

## 2015-08-23 DIAGNOSIS — R262 Difficulty in walking, not elsewhere classified: Secondary | ICD-10-CM | POA: Diagnosis not present

## 2015-08-23 DIAGNOSIS — I5042 Chronic combined systolic (congestive) and diastolic (congestive) heart failure: Secondary | ICD-10-CM | POA: Diagnosis not present

## 2015-08-23 DIAGNOSIS — N189 Chronic kidney disease, unspecified: Secondary | ICD-10-CM | POA: Diagnosis not present

## 2015-08-23 DIAGNOSIS — I82409 Acute embolism and thrombosis of unspecified deep veins of unspecified lower extremity: Secondary | ICD-10-CM | POA: Diagnosis not present

## 2015-08-23 DIAGNOSIS — R569 Unspecified convulsions: Secondary | ICD-10-CM | POA: Diagnosis not present

## 2015-08-23 DIAGNOSIS — A419 Sepsis, unspecified organism: Secondary | ICD-10-CM | POA: Diagnosis not present

## 2015-08-23 DIAGNOSIS — Z91040 Latex allergy status: Secondary | ICD-10-CM | POA: Diagnosis not present

## 2015-08-23 DIAGNOSIS — I330 Acute and subacute infective endocarditis: Secondary | ICD-10-CM | POA: Diagnosis not present

## 2015-08-23 DIAGNOSIS — R609 Edema, unspecified: Secondary | ICD-10-CM | POA: Diagnosis not present

## 2015-08-23 DIAGNOSIS — I129 Hypertensive chronic kidney disease with stage 1 through stage 4 chronic kidney disease, or unspecified chronic kidney disease: Secondary | ICD-10-CM | POA: Diagnosis not present

## 2015-08-23 DIAGNOSIS — Z882 Allergy status to sulfonamides status: Secondary | ICD-10-CM | POA: Diagnosis not present

## 2015-08-23 DIAGNOSIS — N2581 Secondary hyperparathyroidism of renal origin: Secondary | ICD-10-CM | POA: Diagnosis not present

## 2015-08-23 DIAGNOSIS — J9601 Acute respiratory failure with hypoxia: Secondary | ICD-10-CM | POA: Diagnosis not present

## 2015-08-23 DIAGNOSIS — E119 Type 2 diabetes mellitus without complications: Secondary | ICD-10-CM | POA: Diagnosis not present

## 2015-08-23 DIAGNOSIS — D688 Other specified coagulation defects: Secondary | ICD-10-CM | POA: Diagnosis not present

## 2015-08-23 DIAGNOSIS — R112 Nausea with vomiting, unspecified: Secondary | ICD-10-CM | POA: Diagnosis not present

## 2015-08-23 DIAGNOSIS — N186 End stage renal disease: Secondary | ICD-10-CM | POA: Diagnosis not present

## 2015-08-23 DIAGNOSIS — I4891 Unspecified atrial fibrillation: Secondary | ICD-10-CM | POA: Diagnosis not present

## 2015-08-23 DIAGNOSIS — I10 Essential (primary) hypertension: Secondary | ICD-10-CM | POA: Diagnosis not present

## 2015-08-23 DIAGNOSIS — R1111 Vomiting without nausea: Secondary | ICD-10-CM | POA: Diagnosis not present

## 2015-08-23 DIAGNOSIS — J188 Other pneumonia, unspecified organism: Secondary | ICD-10-CM | POA: Diagnosis not present

## 2015-08-23 DIAGNOSIS — E109 Type 1 diabetes mellitus without complications: Secondary | ICD-10-CM | POA: Diagnosis not present

## 2015-08-23 DIAGNOSIS — Z880 Allergy status to penicillin: Secondary | ICD-10-CM | POA: Diagnosis not present

## 2015-08-23 DIAGNOSIS — J9600 Acute respiratory failure, unspecified whether with hypoxia or hypercapnia: Secondary | ICD-10-CM | POA: Diagnosis not present

## 2015-08-23 DIAGNOSIS — I639 Cerebral infarction, unspecified: Secondary | ICD-10-CM | POA: Diagnosis not present

## 2015-08-23 DIAGNOSIS — G9340 Encephalopathy, unspecified: Secondary | ICD-10-CM | POA: Diagnosis not present

## 2015-08-23 DIAGNOSIS — Y832 Surgical operation with anastomosis, bypass or graft as the cause of abnormal reaction of the patient, or of later complication, without mention of misadventure at the time of the procedure: Secondary | ICD-10-CM | POA: Diagnosis not present

## 2015-08-23 DIAGNOSIS — T82590A Other mechanical complication of surgically created arteriovenous fistula, initial encounter: Secondary | ICD-10-CM | POA: Diagnosis not present

## 2015-08-23 DIAGNOSIS — D649 Anemia, unspecified: Secondary | ICD-10-CM | POA: Diagnosis not present

## 2015-08-23 DIAGNOSIS — Z8673 Personal history of transient ischemic attack (TIA), and cerebral infarction without residual deficits: Secondary | ICD-10-CM | POA: Diagnosis not present

## 2015-08-23 DIAGNOSIS — I5032 Chronic diastolic (congestive) heart failure: Secondary | ICD-10-CM | POA: Diagnosis not present

## 2015-08-23 DIAGNOSIS — Z87891 Personal history of nicotine dependence: Secondary | ICD-10-CM | POA: Diagnosis not present

## 2015-08-23 DIAGNOSIS — Z794 Long term (current) use of insulin: Secondary | ICD-10-CM | POA: Diagnosis not present

## 2015-08-23 DIAGNOSIS — I509 Heart failure, unspecified: Secondary | ICD-10-CM | POA: Diagnosis not present

## 2015-08-23 DIAGNOSIS — R110 Nausea: Secondary | ICD-10-CM | POA: Diagnosis not present

## 2015-08-25 DIAGNOSIS — D631 Anemia in chronic kidney disease: Secondary | ICD-10-CM | POA: Diagnosis not present

## 2015-08-25 DIAGNOSIS — A419 Sepsis, unspecified organism: Secondary | ICD-10-CM | POA: Diagnosis not present

## 2015-08-25 DIAGNOSIS — I5020 Unspecified systolic (congestive) heart failure: Secondary | ICD-10-CM | POA: Diagnosis not present

## 2015-08-25 DIAGNOSIS — M6281 Muscle weakness (generalized): Secondary | ICD-10-CM | POA: Diagnosis not present

## 2015-08-25 DIAGNOSIS — N2581 Secondary hyperparathyroidism of renal origin: Secondary | ICD-10-CM | POA: Diagnosis not present

## 2015-08-25 DIAGNOSIS — Z992 Dependence on renal dialysis: Secondary | ICD-10-CM | POA: Diagnosis not present

## 2015-08-25 DIAGNOSIS — N186 End stage renal disease: Secondary | ICD-10-CM | POA: Diagnosis not present

## 2015-08-25 DIAGNOSIS — I330 Acute and subacute infective endocarditis: Secondary | ICD-10-CM | POA: Diagnosis not present

## 2015-08-25 DIAGNOSIS — J9600 Acute respiratory failure, unspecified whether with hypoxia or hypercapnia: Secondary | ICD-10-CM | POA: Diagnosis not present

## 2015-08-26 DIAGNOSIS — T82898A Other specified complication of vascular prosthetic devices, implants and grafts, initial encounter: Secondary | ICD-10-CM | POA: Diagnosis not present

## 2015-08-26 DIAGNOSIS — I132 Hypertensive heart and chronic kidney disease with heart failure and with stage 5 chronic kidney disease, or end stage renal disease: Secondary | ICD-10-CM | POA: Diagnosis not present

## 2015-08-26 DIAGNOSIS — N186 End stage renal disease: Secondary | ICD-10-CM | POA: Diagnosis not present

## 2015-08-26 DIAGNOSIS — Z8673 Personal history of transient ischemic attack (TIA), and cerebral infarction without residual deficits: Secondary | ICD-10-CM | POA: Diagnosis not present

## 2015-08-26 DIAGNOSIS — T82590A Other mechanical complication of surgically created arteriovenous fistula, initial encounter: Secondary | ICD-10-CM | POA: Diagnosis not present

## 2015-08-26 DIAGNOSIS — Y832 Surgical operation with anastomosis, bypass or graft as the cause of abnormal reaction of the patient, or of later complication, without mention of misadventure at the time of the procedure: Secondary | ICD-10-CM | POA: Diagnosis not present

## 2015-08-26 DIAGNOSIS — I871 Compression of vein: Secondary | ICD-10-CM | POA: Diagnosis not present

## 2015-08-26 DIAGNOSIS — K219 Gastro-esophageal reflux disease without esophagitis: Secondary | ICD-10-CM | POA: Diagnosis not present

## 2015-08-26 DIAGNOSIS — I509 Heart failure, unspecified: Secondary | ICD-10-CM | POA: Diagnosis not present

## 2015-08-26 DIAGNOSIS — Z992 Dependence on renal dialysis: Secondary | ICD-10-CM | POA: Diagnosis not present

## 2015-08-26 DIAGNOSIS — M7989 Other specified soft tissue disorders: Secondary | ICD-10-CM | POA: Diagnosis not present

## 2015-08-27 DIAGNOSIS — N186 End stage renal disease: Secondary | ICD-10-CM | POA: Diagnosis not present

## 2015-08-27 DIAGNOSIS — N2581 Secondary hyperparathyroidism of renal origin: Secondary | ICD-10-CM | POA: Diagnosis not present

## 2015-08-27 DIAGNOSIS — D631 Anemia in chronic kidney disease: Secondary | ICD-10-CM | POA: Diagnosis not present

## 2015-08-27 DIAGNOSIS — Z992 Dependence on renal dialysis: Secondary | ICD-10-CM | POA: Diagnosis not present

## 2015-08-27 DIAGNOSIS — I330 Acute and subacute infective endocarditis: Secondary | ICD-10-CM | POA: Diagnosis not present

## 2015-08-27 DIAGNOSIS — A419 Sepsis, unspecified organism: Secondary | ICD-10-CM | POA: Diagnosis not present

## 2015-08-30 DIAGNOSIS — A419 Sepsis, unspecified organism: Secondary | ICD-10-CM | POA: Diagnosis not present

## 2015-08-30 DIAGNOSIS — I330 Acute and subacute infective endocarditis: Secondary | ICD-10-CM | POA: Diagnosis not present

## 2015-08-30 DIAGNOSIS — Z992 Dependence on renal dialysis: Secondary | ICD-10-CM | POA: Diagnosis not present

## 2015-08-30 DIAGNOSIS — N2581 Secondary hyperparathyroidism of renal origin: Secondary | ICD-10-CM | POA: Diagnosis not present

## 2015-08-30 DIAGNOSIS — D631 Anemia in chronic kidney disease: Secondary | ICD-10-CM | POA: Diagnosis not present

## 2015-08-30 DIAGNOSIS — N186 End stage renal disease: Secondary | ICD-10-CM | POA: Diagnosis not present

## 2015-09-01 DIAGNOSIS — N2581 Secondary hyperparathyroidism of renal origin: Secondary | ICD-10-CM | POA: Diagnosis not present

## 2015-09-01 DIAGNOSIS — D631 Anemia in chronic kidney disease: Secondary | ICD-10-CM | POA: Diagnosis not present

## 2015-09-01 DIAGNOSIS — N186 End stage renal disease: Secondary | ICD-10-CM | POA: Diagnosis not present

## 2015-09-01 DIAGNOSIS — Z992 Dependence on renal dialysis: Secondary | ICD-10-CM | POA: Diagnosis not present

## 2015-09-01 DIAGNOSIS — I330 Acute and subacute infective endocarditis: Secondary | ICD-10-CM | POA: Diagnosis not present

## 2015-09-01 DIAGNOSIS — A419 Sepsis, unspecified organism: Secondary | ICD-10-CM | POA: Diagnosis not present

## 2015-09-03 DIAGNOSIS — D631 Anemia in chronic kidney disease: Secondary | ICD-10-CM | POA: Diagnosis not present

## 2015-09-03 DIAGNOSIS — N186 End stage renal disease: Secondary | ICD-10-CM | POA: Diagnosis not present

## 2015-09-03 DIAGNOSIS — Z992 Dependence on renal dialysis: Secondary | ICD-10-CM | POA: Diagnosis not present

## 2015-09-03 DIAGNOSIS — I330 Acute and subacute infective endocarditis: Secondary | ICD-10-CM | POA: Diagnosis not present

## 2015-09-03 DIAGNOSIS — N2581 Secondary hyperparathyroidism of renal origin: Secondary | ICD-10-CM | POA: Diagnosis not present

## 2015-09-03 DIAGNOSIS — A419 Sepsis, unspecified organism: Secondary | ICD-10-CM | POA: Diagnosis not present

## 2015-09-06 DIAGNOSIS — D631 Anemia in chronic kidney disease: Secondary | ICD-10-CM | POA: Diagnosis not present

## 2015-09-06 DIAGNOSIS — I330 Acute and subacute infective endocarditis: Secondary | ICD-10-CM | POA: Diagnosis not present

## 2015-09-06 DIAGNOSIS — N186 End stage renal disease: Secondary | ICD-10-CM | POA: Diagnosis not present

## 2015-09-06 DIAGNOSIS — A419 Sepsis, unspecified organism: Secondary | ICD-10-CM | POA: Diagnosis not present

## 2015-09-06 DIAGNOSIS — Z992 Dependence on renal dialysis: Secondary | ICD-10-CM | POA: Diagnosis not present

## 2015-09-06 DIAGNOSIS — N2581 Secondary hyperparathyroidism of renal origin: Secondary | ICD-10-CM | POA: Diagnosis not present

## 2015-09-08 DIAGNOSIS — N186 End stage renal disease: Secondary | ICD-10-CM | POA: Diagnosis not present

## 2015-09-08 DIAGNOSIS — D631 Anemia in chronic kidney disease: Secondary | ICD-10-CM | POA: Diagnosis not present

## 2015-09-08 DIAGNOSIS — I330 Acute and subacute infective endocarditis: Secondary | ICD-10-CM | POA: Diagnosis not present

## 2015-09-08 DIAGNOSIS — Z992 Dependence on renal dialysis: Secondary | ICD-10-CM | POA: Diagnosis not present

## 2015-09-08 DIAGNOSIS — A419 Sepsis, unspecified organism: Secondary | ICD-10-CM | POA: Diagnosis not present

## 2015-09-08 DIAGNOSIS — N2581 Secondary hyperparathyroidism of renal origin: Secondary | ICD-10-CM | POA: Diagnosis not present

## 2015-09-10 DIAGNOSIS — A419 Sepsis, unspecified organism: Secondary | ICD-10-CM | POA: Diagnosis not present

## 2015-09-10 DIAGNOSIS — I330 Acute and subacute infective endocarditis: Secondary | ICD-10-CM | POA: Diagnosis not present

## 2015-09-10 DIAGNOSIS — D631 Anemia in chronic kidney disease: Secondary | ICD-10-CM | POA: Diagnosis not present

## 2015-09-10 DIAGNOSIS — N2581 Secondary hyperparathyroidism of renal origin: Secondary | ICD-10-CM | POA: Diagnosis not present

## 2015-09-10 DIAGNOSIS — Z992 Dependence on renal dialysis: Secondary | ICD-10-CM | POA: Diagnosis not present

## 2015-09-10 DIAGNOSIS — N186 End stage renal disease: Secondary | ICD-10-CM | POA: Diagnosis not present

## 2015-09-12 DIAGNOSIS — N186 End stage renal disease: Secondary | ICD-10-CM | POA: Diagnosis not present

## 2015-09-13 DIAGNOSIS — G9340 Encephalopathy, unspecified: Secondary | ICD-10-CM | POA: Diagnosis not present

## 2015-09-13 DIAGNOSIS — M6281 Muscle weakness (generalized): Secondary | ICD-10-CM | POA: Diagnosis not present

## 2015-09-13 DIAGNOSIS — R488 Other symbolic dysfunctions: Secondary | ICD-10-CM | POA: Diagnosis not present

## 2015-09-13 DIAGNOSIS — N189 Chronic kidney disease, unspecified: Secondary | ICD-10-CM | POA: Diagnosis not present

## 2015-09-13 DIAGNOSIS — Z23 Encounter for immunization: Secondary | ICD-10-CM | POA: Diagnosis not present

## 2015-09-13 DIAGNOSIS — I10 Essential (primary) hypertension: Secondary | ICD-10-CM | POA: Diagnosis not present

## 2015-09-13 DIAGNOSIS — D631 Anemia in chronic kidney disease: Secondary | ICD-10-CM | POA: Diagnosis not present

## 2015-09-13 DIAGNOSIS — J9601 Acute respiratory failure with hypoxia: Secondary | ICD-10-CM | POA: Diagnosis not present

## 2015-09-13 DIAGNOSIS — A419 Sepsis, unspecified organism: Secondary | ICD-10-CM | POA: Diagnosis not present

## 2015-09-13 DIAGNOSIS — Z992 Dependence on renal dialysis: Secondary | ICD-10-CM | POA: Diagnosis not present

## 2015-09-13 DIAGNOSIS — E119 Type 2 diabetes mellitus without complications: Secondary | ICD-10-CM | POA: Diagnosis not present

## 2015-09-13 DIAGNOSIS — I330 Acute and subacute infective endocarditis: Secondary | ICD-10-CM | POA: Diagnosis not present

## 2015-09-13 DIAGNOSIS — N186 End stage renal disease: Secondary | ICD-10-CM | POA: Diagnosis not present

## 2015-09-13 DIAGNOSIS — I5042 Chronic combined systolic (congestive) and diastolic (congestive) heart failure: Secondary | ICD-10-CM | POA: Diagnosis not present

## 2015-09-13 DIAGNOSIS — Z4931 Encounter for adequacy testing for hemodialysis: Secondary | ICD-10-CM | POA: Diagnosis not present

## 2015-09-13 DIAGNOSIS — G9349 Other encephalopathy: Secondary | ICD-10-CM | POA: Diagnosis not present

## 2015-09-13 DIAGNOSIS — J188 Other pneumonia, unspecified organism: Secondary | ICD-10-CM | POA: Diagnosis not present

## 2015-09-15 DIAGNOSIS — I330 Acute and subacute infective endocarditis: Secondary | ICD-10-CM | POA: Diagnosis not present

## 2015-09-15 DIAGNOSIS — Z992 Dependence on renal dialysis: Secondary | ICD-10-CM | POA: Diagnosis not present

## 2015-09-15 DIAGNOSIS — N186 End stage renal disease: Secondary | ICD-10-CM | POA: Diagnosis not present

## 2015-09-15 DIAGNOSIS — A419 Sepsis, unspecified organism: Secondary | ICD-10-CM | POA: Diagnosis not present

## 2015-09-15 DIAGNOSIS — D631 Anemia in chronic kidney disease: Secondary | ICD-10-CM | POA: Diagnosis not present

## 2015-09-15 DIAGNOSIS — Z23 Encounter for immunization: Secondary | ICD-10-CM | POA: Diagnosis not present

## 2015-09-16 DIAGNOSIS — Z4889 Encounter for other specified surgical aftercare: Secondary | ICD-10-CM | POA: Diagnosis not present

## 2015-09-16 DIAGNOSIS — N186 End stage renal disease: Secondary | ICD-10-CM | POA: Diagnosis not present

## 2015-09-17 DIAGNOSIS — Z992 Dependence on renal dialysis: Secondary | ICD-10-CM | POA: Diagnosis not present

## 2015-09-17 DIAGNOSIS — N186 End stage renal disease: Secondary | ICD-10-CM | POA: Diagnosis not present

## 2015-09-17 DIAGNOSIS — Z23 Encounter for immunization: Secondary | ICD-10-CM | POA: Diagnosis not present

## 2015-09-17 DIAGNOSIS — A419 Sepsis, unspecified organism: Secondary | ICD-10-CM | POA: Diagnosis not present

## 2015-09-17 DIAGNOSIS — D631 Anemia in chronic kidney disease: Secondary | ICD-10-CM | POA: Diagnosis not present

## 2015-09-17 DIAGNOSIS — I330 Acute and subacute infective endocarditis: Secondary | ICD-10-CM | POA: Diagnosis not present

## 2015-09-20 DIAGNOSIS — A419 Sepsis, unspecified organism: Secondary | ICD-10-CM | POA: Diagnosis not present

## 2015-09-20 DIAGNOSIS — Z23 Encounter for immunization: Secondary | ICD-10-CM | POA: Diagnosis not present

## 2015-09-20 DIAGNOSIS — I509 Heart failure, unspecified: Secondary | ICD-10-CM | POA: Diagnosis not present

## 2015-09-20 DIAGNOSIS — N186 End stage renal disease: Secondary | ICD-10-CM | POA: Diagnosis not present

## 2015-09-20 DIAGNOSIS — J9600 Acute respiratory failure, unspecified whether with hypoxia or hypercapnia: Secondary | ICD-10-CM | POA: Diagnosis not present

## 2015-09-20 DIAGNOSIS — Z992 Dependence on renal dialysis: Secondary | ICD-10-CM | POA: Diagnosis not present

## 2015-09-20 DIAGNOSIS — I639 Cerebral infarction, unspecified: Secondary | ICD-10-CM | POA: Diagnosis not present

## 2015-09-20 DIAGNOSIS — I330 Acute and subacute infective endocarditis: Secondary | ICD-10-CM | POA: Diagnosis not present

## 2015-09-20 DIAGNOSIS — D631 Anemia in chronic kidney disease: Secondary | ICD-10-CM | POA: Diagnosis not present

## 2015-09-21 DIAGNOSIS — M6281 Muscle weakness (generalized): Secondary | ICD-10-CM | POA: Diagnosis not present

## 2015-09-21 DIAGNOSIS — I10 Essential (primary) hypertension: Secondary | ICD-10-CM | POA: Diagnosis not present

## 2015-09-21 DIAGNOSIS — N186 End stage renal disease: Secondary | ICD-10-CM | POA: Diagnosis not present

## 2015-09-21 DIAGNOSIS — I5042 Chronic combined systolic (congestive) and diastolic (congestive) heart failure: Secondary | ICD-10-CM | POA: Diagnosis not present

## 2015-09-21 DIAGNOSIS — J188 Other pneumonia, unspecified organism: Secondary | ICD-10-CM | POA: Diagnosis not present

## 2015-09-21 DIAGNOSIS — R488 Other symbolic dysfunctions: Secondary | ICD-10-CM | POA: Diagnosis not present

## 2015-09-21 DIAGNOSIS — N189 Chronic kidney disease, unspecified: Secondary | ICD-10-CM | POA: Diagnosis not present

## 2015-09-22 DIAGNOSIS — M6281 Muscle weakness (generalized): Secondary | ICD-10-CM | POA: Diagnosis not present

## 2015-09-22 DIAGNOSIS — R1312 Dysphagia, oropharyngeal phase: Secondary | ICD-10-CM | POA: Diagnosis not present

## 2015-09-22 DIAGNOSIS — I10 Essential (primary) hypertension: Secondary | ICD-10-CM | POA: Diagnosis not present

## 2015-09-22 DIAGNOSIS — N186 End stage renal disease: Secondary | ICD-10-CM | POA: Diagnosis not present

## 2015-09-23 DIAGNOSIS — D631 Anemia in chronic kidney disease: Secondary | ICD-10-CM | POA: Diagnosis not present

## 2015-09-23 DIAGNOSIS — E1322 Other specified diabetes mellitus with diabetic chronic kidney disease: Secondary | ICD-10-CM | POA: Diagnosis not present

## 2015-09-23 DIAGNOSIS — N186 End stage renal disease: Secondary | ICD-10-CM | POA: Diagnosis not present

## 2015-09-23 DIAGNOSIS — N2581 Secondary hyperparathyroidism of renal origin: Secondary | ICD-10-CM | POA: Diagnosis not present

## 2015-09-24 DIAGNOSIS — M6281 Muscle weakness (generalized): Secondary | ICD-10-CM | POA: Diagnosis not present

## 2015-09-24 DIAGNOSIS — I10 Essential (primary) hypertension: Secondary | ICD-10-CM | POA: Diagnosis not present

## 2015-09-24 DIAGNOSIS — R1312 Dysphagia, oropharyngeal phase: Secondary | ICD-10-CM | POA: Diagnosis not present

## 2015-09-24 DIAGNOSIS — N186 End stage renal disease: Secondary | ICD-10-CM | POA: Diagnosis not present

## 2015-09-26 DIAGNOSIS — M6281 Muscle weakness (generalized): Secondary | ICD-10-CM | POA: Diagnosis not present

## 2015-09-26 DIAGNOSIS — N186 End stage renal disease: Secondary | ICD-10-CM | POA: Diagnosis not present

## 2015-09-26 DIAGNOSIS — R1312 Dysphagia, oropharyngeal phase: Secondary | ICD-10-CM | POA: Diagnosis not present

## 2015-09-26 DIAGNOSIS — I10 Essential (primary) hypertension: Secondary | ICD-10-CM | POA: Diagnosis not present

## 2015-09-27 DIAGNOSIS — M6281 Muscle weakness (generalized): Secondary | ICD-10-CM | POA: Diagnosis not present

## 2015-09-27 DIAGNOSIS — I10 Essential (primary) hypertension: Secondary | ICD-10-CM | POA: Diagnosis not present

## 2015-09-27 DIAGNOSIS — N186 End stage renal disease: Secondary | ICD-10-CM | POA: Diagnosis not present

## 2015-09-27 DIAGNOSIS — E1322 Other specified diabetes mellitus with diabetic chronic kidney disease: Secondary | ICD-10-CM | POA: Diagnosis not present

## 2015-09-27 DIAGNOSIS — R1312 Dysphagia, oropharyngeal phase: Secondary | ICD-10-CM | POA: Diagnosis not present

## 2015-09-27 DIAGNOSIS — D631 Anemia in chronic kidney disease: Secondary | ICD-10-CM | POA: Diagnosis not present

## 2015-09-27 DIAGNOSIS — N2581 Secondary hyperparathyroidism of renal origin: Secondary | ICD-10-CM | POA: Diagnosis not present

## 2015-09-28 DIAGNOSIS — N186 End stage renal disease: Secondary | ICD-10-CM | POA: Diagnosis not present

## 2015-09-28 DIAGNOSIS — R1312 Dysphagia, oropharyngeal phase: Secondary | ICD-10-CM | POA: Diagnosis not present

## 2015-09-28 DIAGNOSIS — I10 Essential (primary) hypertension: Secondary | ICD-10-CM | POA: Diagnosis not present

## 2015-09-28 DIAGNOSIS — M6281 Muscle weakness (generalized): Secondary | ICD-10-CM | POA: Diagnosis not present

## 2015-09-29 DIAGNOSIS — D631 Anemia in chronic kidney disease: Secondary | ICD-10-CM | POA: Diagnosis not present

## 2015-09-29 DIAGNOSIS — I10 Essential (primary) hypertension: Secondary | ICD-10-CM | POA: Diagnosis not present

## 2015-09-29 DIAGNOSIS — F0390 Unspecified dementia without behavioral disturbance: Secondary | ICD-10-CM | POA: Diagnosis not present

## 2015-09-29 DIAGNOSIS — F603 Borderline personality disorder: Secondary | ICD-10-CM | POA: Diagnosis not present

## 2015-09-29 DIAGNOSIS — F329 Major depressive disorder, single episode, unspecified: Secondary | ICD-10-CM | POA: Diagnosis not present

## 2015-09-29 DIAGNOSIS — F419 Anxiety disorder, unspecified: Secondary | ICD-10-CM | POA: Diagnosis not present

## 2015-09-29 DIAGNOSIS — N2581 Secondary hyperparathyroidism of renal origin: Secondary | ICD-10-CM | POA: Diagnosis not present

## 2015-09-29 DIAGNOSIS — R1312 Dysphagia, oropharyngeal phase: Secondary | ICD-10-CM | POA: Diagnosis not present

## 2015-09-29 DIAGNOSIS — M6281 Muscle weakness (generalized): Secondary | ICD-10-CM | POA: Diagnosis not present

## 2015-09-29 DIAGNOSIS — E1322 Other specified diabetes mellitus with diabetic chronic kidney disease: Secondary | ICD-10-CM | POA: Diagnosis not present

## 2015-09-29 DIAGNOSIS — N186 End stage renal disease: Secondary | ICD-10-CM | POA: Diagnosis not present

## 2015-09-30 DIAGNOSIS — I10 Essential (primary) hypertension: Secondary | ICD-10-CM | POA: Diagnosis not present

## 2015-09-30 DIAGNOSIS — N186 End stage renal disease: Secondary | ICD-10-CM | POA: Diagnosis not present

## 2015-09-30 DIAGNOSIS — R1312 Dysphagia, oropharyngeal phase: Secondary | ICD-10-CM | POA: Diagnosis not present

## 2015-09-30 DIAGNOSIS — M6281 Muscle weakness (generalized): Secondary | ICD-10-CM | POA: Diagnosis not present

## 2015-10-01 DIAGNOSIS — D631 Anemia in chronic kidney disease: Secondary | ICD-10-CM | POA: Diagnosis not present

## 2015-10-01 DIAGNOSIS — N2581 Secondary hyperparathyroidism of renal origin: Secondary | ICD-10-CM | POA: Diagnosis not present

## 2015-10-01 DIAGNOSIS — E1322 Other specified diabetes mellitus with diabetic chronic kidney disease: Secondary | ICD-10-CM | POA: Diagnosis not present

## 2015-10-01 DIAGNOSIS — N186 End stage renal disease: Secondary | ICD-10-CM | POA: Diagnosis not present

## 2015-10-03 DIAGNOSIS — I10 Essential (primary) hypertension: Secondary | ICD-10-CM | POA: Diagnosis not present

## 2015-10-03 DIAGNOSIS — N186 End stage renal disease: Secondary | ICD-10-CM | POA: Diagnosis not present

## 2015-10-03 DIAGNOSIS — M6281 Muscle weakness (generalized): Secondary | ICD-10-CM | POA: Diagnosis not present

## 2015-10-03 DIAGNOSIS — R1312 Dysphagia, oropharyngeal phase: Secondary | ICD-10-CM | POA: Diagnosis not present

## 2015-10-04 DIAGNOSIS — D631 Anemia in chronic kidney disease: Secondary | ICD-10-CM | POA: Diagnosis not present

## 2015-10-04 DIAGNOSIS — N2581 Secondary hyperparathyroidism of renal origin: Secondary | ICD-10-CM | POA: Diagnosis not present

## 2015-10-04 DIAGNOSIS — N186 End stage renal disease: Secondary | ICD-10-CM | POA: Diagnosis not present

## 2015-10-04 DIAGNOSIS — I10 Essential (primary) hypertension: Secondary | ICD-10-CM | POA: Diagnosis not present

## 2015-10-04 DIAGNOSIS — R1312 Dysphagia, oropharyngeal phase: Secondary | ICD-10-CM | POA: Diagnosis not present

## 2015-10-04 DIAGNOSIS — M6281 Muscle weakness (generalized): Secondary | ICD-10-CM | POA: Diagnosis not present

## 2015-10-04 DIAGNOSIS — E1322 Other specified diabetes mellitus with diabetic chronic kidney disease: Secondary | ICD-10-CM | POA: Diagnosis not present

## 2015-10-05 DIAGNOSIS — N186 End stage renal disease: Secondary | ICD-10-CM | POA: Diagnosis not present

## 2015-10-05 DIAGNOSIS — M6281 Muscle weakness (generalized): Secondary | ICD-10-CM | POA: Diagnosis not present

## 2015-10-05 DIAGNOSIS — R1312 Dysphagia, oropharyngeal phase: Secondary | ICD-10-CM | POA: Diagnosis not present

## 2015-10-05 DIAGNOSIS — I10 Essential (primary) hypertension: Secondary | ICD-10-CM | POA: Diagnosis not present

## 2015-10-07 DIAGNOSIS — N186 End stage renal disease: Secondary | ICD-10-CM | POA: Diagnosis not present

## 2015-10-07 DIAGNOSIS — N2581 Secondary hyperparathyroidism of renal origin: Secondary | ICD-10-CM | POA: Diagnosis not present

## 2015-10-07 DIAGNOSIS — E1322 Other specified diabetes mellitus with diabetic chronic kidney disease: Secondary | ICD-10-CM | POA: Diagnosis not present

## 2015-10-07 DIAGNOSIS — D631 Anemia in chronic kidney disease: Secondary | ICD-10-CM | POA: Diagnosis not present

## 2015-10-10 DIAGNOSIS — N186 End stage renal disease: Secondary | ICD-10-CM | POA: Diagnosis not present

## 2015-10-11 DIAGNOSIS — N2581 Secondary hyperparathyroidism of renal origin: Secondary | ICD-10-CM | POA: Diagnosis not present

## 2015-10-11 DIAGNOSIS — D631 Anemia in chronic kidney disease: Secondary | ICD-10-CM | POA: Diagnosis not present

## 2015-10-11 DIAGNOSIS — E1322 Other specified diabetes mellitus with diabetic chronic kidney disease: Secondary | ICD-10-CM | POA: Diagnosis not present

## 2015-10-11 DIAGNOSIS — N186 End stage renal disease: Secondary | ICD-10-CM | POA: Diagnosis not present

## 2015-10-12 DIAGNOSIS — E1122 Type 2 diabetes mellitus with diabetic chronic kidney disease: Secondary | ICD-10-CM | POA: Diagnosis not present

## 2015-10-12 DIAGNOSIS — N186 End stage renal disease: Secondary | ICD-10-CM | POA: Diagnosis not present

## 2015-10-12 DIAGNOSIS — Z992 Dependence on renal dialysis: Secondary | ICD-10-CM | POA: Diagnosis not present

## 2015-10-13 DIAGNOSIS — N186 End stage renal disease: Secondary | ICD-10-CM | POA: Diagnosis not present

## 2015-10-13 DIAGNOSIS — D631 Anemia in chronic kidney disease: Secondary | ICD-10-CM | POA: Diagnosis not present

## 2015-10-13 DIAGNOSIS — N2581 Secondary hyperparathyroidism of renal origin: Secondary | ICD-10-CM | POA: Diagnosis not present

## 2015-10-13 DIAGNOSIS — E1122 Type 2 diabetes mellitus with diabetic chronic kidney disease: Secondary | ICD-10-CM | POA: Diagnosis not present

## 2015-10-14 ENCOUNTER — Ambulatory Visit (INDEPENDENT_AMBULATORY_CARE_PROVIDER_SITE_OTHER): Payer: Medicare Other

## 2015-10-14 ENCOUNTER — Encounter: Payer: Self-pay | Admitting: Podiatry

## 2015-10-14 ENCOUNTER — Ambulatory Visit (INDEPENDENT_AMBULATORY_CARE_PROVIDER_SITE_OTHER): Payer: Medicare Other | Admitting: Podiatry

## 2015-10-14 ENCOUNTER — Other Ambulatory Visit: Payer: Self-pay

## 2015-10-14 DIAGNOSIS — L97529 Non-pressure chronic ulcer of other part of left foot with unspecified severity: Secondary | ICD-10-CM | POA: Diagnosis not present

## 2015-10-14 DIAGNOSIS — E11621 Type 2 diabetes mellitus with foot ulcer: Secondary | ICD-10-CM | POA: Diagnosis not present

## 2015-10-14 DIAGNOSIS — E1149 Type 2 diabetes mellitus with other diabetic neurological complication: Secondary | ICD-10-CM | POA: Diagnosis not present

## 2015-10-14 DIAGNOSIS — R52 Pain, unspecified: Secondary | ICD-10-CM

## 2015-10-14 DIAGNOSIS — N185 Chronic kidney disease, stage 5: Secondary | ICD-10-CM

## 2015-10-14 DIAGNOSIS — Z01810 Encounter for preprocedural cardiovascular examination: Secondary | ICD-10-CM

## 2015-10-14 MED ORDER — SILVER SULFADIAZINE 1 % EX CREA: 1.0000 "application " | TOPICAL_CREAM | Freq: Every day | CUTANEOUS | Status: DC

## 2015-10-14 MED ORDER — DOXYCYCLINE HYCLATE 100 MG PO TABS: 100.0000 mg | ORAL_TABLET | Freq: Two times a day (BID) | ORAL | Status: DC

## 2015-10-14 NOTE — Progress Notes (Signed)
   Subjective:    Patient ID: Deborah Hendricks, female    DOB: 30-Jun-1946, 69 y.o.   MRN: [id number]  HPI 69 year old female presents the office they with concerns of wounds to her left foot which has been ongoing the last several weeks. She does previously have the rotations of the right second through fifth digits. She states at present she has noticed upon coming from the wound however she has not had any recently. She is grossly been on antibiotics she states that she is also using antibiotic cream overlying the area. She states that she does not have sensation to her feet. She is unable to care of the wound with anybody else. This has been ongoing for approximately one month. No other complaints.  Review of Systems  Musculoskeletal: Positive for gait problem.  Skin: Positive for color change and wound.       Objective:   Physical Exam AAO 3, NAD DP/PT pulses decrease, CRT somewhat delayed Protective sensation decreased with Simms Weinstein monofilament, vibratory sensation decreased. Previous amputations of the right second through fifth digits. There is no open lesions identified on the right foot. There is no pre-ulcerative lesions on the right foot. On the left foot on the dorsal aspect of the hallux is an overlying superficial periwound with a  overlying eschar measuring 3 x 1.5 cm and along the lateral aspect of the fifth digit of a similar lesion which appears to be superficial with overlying eschar measuring 0.8 x 0.8 cm. There is a small amount of epidermal lysis around each of these lesions. There is no drainage or pus expressed. There is no swelling erythema, ascending cellulitis, fluctuance, crepitus, malodor, drainage/purulence. There are no other open lesions or pre-ulcerative lesions identified this time. No areas of tenderness bilateral lower extremities. There is no edema, erythema, increased warmth. calf compression, swelling, warmth, erythema.      Assessment & Plan:    69 year old female left first and fifth dorsal ulcerations, currently without signs of infection. -X-rays were obtained and reviewed with the patient.  -Treatment options discussed including all alternatives, risks, and complications -Etiology of symptoms were discussed -Epidermal lysis around the wounds were lightly debrided without, complications/bleeding. Recommended Silvadene cream to the area daily. -Given her history we'll start clindamycin case of infection although there is no pus expressed today. -The patient's daughter did call the office today that she did not want him on to have any kind of irritation. The patient states that up amputation is to be done that edge should proceed in the future if needed. I encouraged her to help discussion with her daughter about this should the time come.  -Will order arterial studies did decreased pulses -Monitor for any clinical signs or symptoms of infection and directed to call the office immediately should any occur or go to the ER. -Follow-up 2 weeks or sooner if any problems arise. In the meantime, encouraged to call the office with any questions, concerns, change in symptoms.   Celesta Gentile, DPM

## 2015-10-14 NOTE — Patient Instructions (Signed)
We will order arterial studies to check your circulation. Silvadene daily to wounds Start doxycycline Monitor for any signs/symptoms of infection. Call the office immediately if any occur or go directly to the emergency room. Call with any questions/concerns.

## 2015-10-18 DIAGNOSIS — N186 End stage renal disease: Secondary | ICD-10-CM | POA: Diagnosis not present

## 2015-10-18 DIAGNOSIS — N2581 Secondary hyperparathyroidism of renal origin: Secondary | ICD-10-CM | POA: Diagnosis not present

## 2015-10-18 DIAGNOSIS — E1122 Type 2 diabetes mellitus with diabetic chronic kidney disease: Secondary | ICD-10-CM | POA: Diagnosis not present

## 2015-10-18 DIAGNOSIS — D631 Anemia in chronic kidney disease: Secondary | ICD-10-CM | POA: Diagnosis not present

## 2015-10-20 DIAGNOSIS — N2581 Secondary hyperparathyroidism of renal origin: Secondary | ICD-10-CM | POA: Diagnosis not present

## 2015-10-20 DIAGNOSIS — D631 Anemia in chronic kidney disease: Secondary | ICD-10-CM | POA: Diagnosis not present

## 2015-10-20 DIAGNOSIS — N186 End stage renal disease: Secondary | ICD-10-CM | POA: Diagnosis not present

## 2015-10-20 DIAGNOSIS — E1122 Type 2 diabetes mellitus with diabetic chronic kidney disease: Secondary | ICD-10-CM | POA: Diagnosis not present

## 2015-10-21 ENCOUNTER — Telehealth: Payer: Self-pay | Admitting: *Deleted

## 2015-10-21 DIAGNOSIS — R0989 Other specified symptoms and signs involving the circulatory and respiratory systems: Secondary | ICD-10-CM

## 2015-10-21 DIAGNOSIS — E1149 Type 2 diabetes mellitus with other diabetic neurological complication: Secondary | ICD-10-CM | POA: Insufficient documentation

## 2015-10-21 DIAGNOSIS — L97529 Non-pressure chronic ulcer of other part of left foot with unspecified severity: Secondary | ICD-10-CM

## 2015-10-21 DIAGNOSIS — E11621 Type 2 diabetes mellitus with foot ulcer: Secondary | ICD-10-CM | POA: Insufficient documentation

## 2015-10-21 NOTE — Telephone Encounter (Addendum)
Dr. Jacqualyn Posey ordered arterial dopplers for diminished pulses.  Orders and pt data faxed to [facility].  10/28/2015 - Dr. Jacqualyn Posey referred to Dr. Gwenlyn Found [facility] for diminished pulses and necrotic wounds.

## 2015-10-21 NOTE — Telephone Encounter (Addendum)
-----   Message from Trula Slade, DPM sent at 10/21/2015  6:52 AM EST ----- Can you please order arterial studies for her. Thanks.  Orders faxed with pt data.

## 2015-10-22 DIAGNOSIS — E1122 Type 2 diabetes mellitus with diabetic chronic kidney disease: Secondary | ICD-10-CM | POA: Diagnosis not present

## 2015-10-22 DIAGNOSIS — N2581 Secondary hyperparathyroidism of renal origin: Secondary | ICD-10-CM | POA: Diagnosis not present

## 2015-10-22 DIAGNOSIS — D631 Anemia in chronic kidney disease: Secondary | ICD-10-CM | POA: Diagnosis not present

## 2015-10-22 DIAGNOSIS — N186 End stage renal disease: Secondary | ICD-10-CM | POA: Diagnosis not present

## 2015-10-24 ENCOUNTER — Other Ambulatory Visit: Payer: Self-pay | Admitting: Podiatry

## 2015-10-24 DIAGNOSIS — R0989 Other specified symptoms and signs involving the circulatory and respiratory systems: Secondary | ICD-10-CM

## 2015-10-25 DIAGNOSIS — N2581 Secondary hyperparathyroidism of renal origin: Secondary | ICD-10-CM | POA: Diagnosis not present

## 2015-10-25 DIAGNOSIS — E1122 Type 2 diabetes mellitus with diabetic chronic kidney disease: Secondary | ICD-10-CM | POA: Diagnosis not present

## 2015-10-25 DIAGNOSIS — D631 Anemia in chronic kidney disease: Secondary | ICD-10-CM | POA: Diagnosis not present

## 2015-10-25 DIAGNOSIS — N186 End stage renal disease: Secondary | ICD-10-CM | POA: Diagnosis not present

## 2015-10-27 DIAGNOSIS — N186 End stage renal disease: Secondary | ICD-10-CM | POA: Diagnosis not present

## 2015-10-27 DIAGNOSIS — D631 Anemia in chronic kidney disease: Secondary | ICD-10-CM | POA: Diagnosis not present

## 2015-10-27 DIAGNOSIS — E1122 Type 2 diabetes mellitus with diabetic chronic kidney disease: Secondary | ICD-10-CM | POA: Diagnosis not present

## 2015-10-27 DIAGNOSIS — N2581 Secondary hyperparathyroidism of renal origin: Secondary | ICD-10-CM | POA: Diagnosis not present

## 2015-10-28 ENCOUNTER — Ambulatory Visit (INDEPENDENT_AMBULATORY_CARE_PROVIDER_SITE_OTHER): Payer: Medicare Other | Admitting: Podiatry

## 2015-10-28 ENCOUNTER — Encounter: Payer: Self-pay | Admitting: Podiatry

## 2015-10-28 ENCOUNTER — Encounter: Payer: Self-pay | Admitting: Vascular Surgery

## 2015-10-28 VITALS — BP 134/77 | HR 77 | Resp 18

## 2015-10-28 DIAGNOSIS — L97529 Non-pressure chronic ulcer of other part of left foot with unspecified severity: Secondary | ICD-10-CM

## 2015-10-28 DIAGNOSIS — E1149 Type 2 diabetes mellitus with other diabetic neurological complication: Secondary | ICD-10-CM | POA: Diagnosis not present

## 2015-10-28 DIAGNOSIS — E11621 Type 2 diabetes mellitus with foot ulcer: Secondary | ICD-10-CM | POA: Diagnosis not present

## 2015-10-28 MED ORDER — SILVER SULFADIAZINE 1 % EX CREA: 1.0000 "application " | TOPICAL_CREAM | Freq: Every day | CUTANEOUS | Status: DC

## 2015-10-28 MED ORDER — COLLAGENASE 250 UNIT/GM EX OINT: 1.0000 "application " | TOPICAL_OINTMENT | Freq: Every day | CUTANEOUS | Status: DC

## 2015-10-29 DIAGNOSIS — N2581 Secondary hyperparathyroidism of renal origin: Secondary | ICD-10-CM | POA: Diagnosis not present

## 2015-10-29 DIAGNOSIS — D631 Anemia in chronic kidney disease: Secondary | ICD-10-CM | POA: Diagnosis not present

## 2015-10-29 DIAGNOSIS — E1122 Type 2 diabetes mellitus with diabetic chronic kidney disease: Secondary | ICD-10-CM | POA: Diagnosis not present

## 2015-10-29 DIAGNOSIS — N186 End stage renal disease: Secondary | ICD-10-CM | POA: Diagnosis not present

## 2015-10-31 ENCOUNTER — Ambulatory Visit (HOSPITAL_COMMUNITY)
Admission: RE | Admit: 2015-10-31 | Discharge: 2015-10-31 | Disposition: A | Discharge status: Home / Self Care | Payer: Medicare Other | Source: Ambulatory Visit | Attending: Internal Medicine | Admitting: Internal Medicine

## 2015-10-31 DIAGNOSIS — I70269 Atherosclerosis of native arteries of extremities with gangrene, unspecified extremity: Secondary | ICD-10-CM | POA: Diagnosis not present

## 2015-10-31 DIAGNOSIS — I739 Peripheral vascular disease, unspecified: Secondary | ICD-10-CM | POA: Diagnosis not present

## 2015-10-31 DIAGNOSIS — I70201 Unspecified atherosclerosis of native arteries of extremities, right leg: Secondary | ICD-10-CM | POA: Insufficient documentation

## 2015-10-31 DIAGNOSIS — I779 Disorder of arteries and arterioles, unspecified: Secondary | ICD-10-CM | POA: Insufficient documentation

## 2015-10-31 DIAGNOSIS — I771 Stricture of artery: Secondary | ICD-10-CM | POA: Diagnosis not present

## 2015-10-31 DIAGNOSIS — E119 Type 2 diabetes mellitus without complications: Secondary | ICD-10-CM | POA: Diagnosis not present

## 2015-10-31 DIAGNOSIS — R0989 Other specified symptoms and signs involving the circulatory and respiratory systems: Secondary | ICD-10-CM | POA: Diagnosis not present

## 2015-10-31 DIAGNOSIS — I10 Essential (primary) hypertension: Secondary | ICD-10-CM | POA: Insufficient documentation

## 2015-10-31 DIAGNOSIS — L97909 Non-pressure chronic ulcer of unspecified part of unspecified lower leg with unspecified severity: Secondary | ICD-10-CM | POA: Diagnosis not present

## 2015-10-31 NOTE — Progress Notes (Signed)
Subjective: 69 year old female presents the office today with her daughter for follow-up evaluation of wounds to both of her big and fifth toe on the left foot. The patient's daughter states that she was the wound started from pressure in the nursing home. They've been applying Silvadene to the wound daily. Denies any swelling redness or red streaks. This been ongoing for about 1 month prior to her evaluation with me. She denies systemic complaints such as fevers, chills, nausea, vomiting. No calf pain, chest pain, shortness of breath.  Objective: AAO 3, NAD DP/PT pulses decrease, CRT somewhat delayed Protective sensation decreased with Simms Weinstein monofilament, vibratory sensation decreased. Previous amputations of the right second through fifth digits. There is no open lesions identified on the right foot. There is no pre-ulcerative lesions on the right foot. On the left foot on the dorsal aspect of the hallux is an overlying superficial periwound with a  overlying eschar measuring 3 x 1.5 cm and along the lateral aspect of the fifth digit of a similar lesion which appears to be superficial with overlying eschar measuring 0.8 x 0.8 cm. Both of the lesions appear to be about the same in size and grossly unchanged compared to last evaluation. There is a small amount of epidermal lysis around each of these lesions with hyperkerotic periwound. There is no drainage or pus expressed. There is no surrounding erythema, ascending cellulitis, fluctuance, crepitus, malodor, drainage/purulence. There are no other open lesions or pre-ulcerative lesions identified this time. No areas of tenderness bilateral lower extremities.  There is no edema, erythema, increased warmth. calf compression, swelling, warmth, erythema.  Assessment: 69 year old female left first and fifth dorsal ulcerations/eschar, currently without signs of infection.  Plan: -Treatment options discussed including all alternatives, risks, and  complications -Etiology of symptoms were discussed with the patients daughter.  -This small amount of periwound hyperkeratotic and epidermolysis debrided without complications/bleeding. -Will switch to Santyl dressing changes daily. -She has arterial studies scheduled for next week. She has an appointment with Dr. Oneida Alar next week.  -She has finished her course of antibiotic's. We'll hold off on further antibiotics as it does not appear to be clinically infected. -Monitor for any clinical signs or symptoms of infection and directed to call the office immediately should any occur or go to the ER. -Follow-up 2 weeks or sooner if any problems arise. In the meantime, encouraged to call the office with any questions, concerns, change in symptoms.   Celesta Gentile, DPM

## 2015-11-01 DIAGNOSIS — D631 Anemia in chronic kidney disease: Secondary | ICD-10-CM | POA: Diagnosis not present

## 2015-11-01 DIAGNOSIS — E1122 Type 2 diabetes mellitus with diabetic chronic kidney disease: Secondary | ICD-10-CM | POA: Diagnosis not present

## 2015-11-01 DIAGNOSIS — F419 Anxiety disorder, unspecified: Secondary | ICD-10-CM | POA: Diagnosis not present

## 2015-11-01 DIAGNOSIS — N2581 Secondary hyperparathyroidism of renal origin: Secondary | ICD-10-CM | POA: Diagnosis not present

## 2015-11-01 DIAGNOSIS — F329 Major depressive disorder, single episode, unspecified: Secondary | ICD-10-CM | POA: Diagnosis not present

## 2015-11-01 DIAGNOSIS — F0390 Unspecified dementia without behavioral disturbance: Secondary | ICD-10-CM | POA: Diagnosis not present

## 2015-11-01 DIAGNOSIS — F603 Borderline personality disorder: Secondary | ICD-10-CM | POA: Diagnosis not present

## 2015-11-01 DIAGNOSIS — N186 End stage renal disease: Secondary | ICD-10-CM | POA: Diagnosis not present

## 2015-11-02 ENCOUNTER — Telehealth: Payer: Self-pay | Admitting: Cardiovascular Disease

## 2015-11-02 DIAGNOSIS — Z9119 Patient's noncompliance with other medical treatment and regimen: Secondary | ICD-10-CM | POA: Diagnosis not present

## 2015-11-02 DIAGNOSIS — E119 Type 2 diabetes mellitus without complications: Secondary | ICD-10-CM | POA: Diagnosis not present

## 2015-11-02 DIAGNOSIS — S91109A Unspecified open wound of unspecified toe(s) without damage to nail, initial encounter: Secondary | ICD-10-CM | POA: Diagnosis not present

## 2015-11-02 NOTE — Telephone Encounter (Signed)
Patient didn't need a call back , looking at wrong date for when call came in.[name]

## 2015-11-03 ENCOUNTER — Ambulatory Visit: Payer: Medicare Other | Admitting: Vascular Surgery

## 2015-11-03 ENCOUNTER — Encounter (HOSPITAL_COMMUNITY): Payer: Medicare Other

## 2015-11-03 ENCOUNTER — Other Ambulatory Visit (HOSPITAL_COMMUNITY): Payer: Medicare Other

## 2015-11-03 DIAGNOSIS — N2581 Secondary hyperparathyroidism of renal origin: Secondary | ICD-10-CM | POA: Diagnosis not present

## 2015-11-03 DIAGNOSIS — D631 Anemia in chronic kidney disease: Secondary | ICD-10-CM | POA: Diagnosis not present

## 2015-11-03 DIAGNOSIS — N186 End stage renal disease: Secondary | ICD-10-CM | POA: Diagnosis not present

## 2015-11-03 DIAGNOSIS — E1122 Type 2 diabetes mellitus with diabetic chronic kidney disease: Secondary | ICD-10-CM | POA: Diagnosis not present

## 2015-11-05 DIAGNOSIS — N186 End stage renal disease: Secondary | ICD-10-CM | POA: Diagnosis not present

## 2015-11-05 DIAGNOSIS — E1122 Type 2 diabetes mellitus with diabetic chronic kidney disease: Secondary | ICD-10-CM | POA: Diagnosis not present

## 2015-11-05 DIAGNOSIS — N2581 Secondary hyperparathyroidism of renal origin: Secondary | ICD-10-CM | POA: Diagnosis not present

## 2015-11-05 DIAGNOSIS — D631 Anemia in chronic kidney disease: Secondary | ICD-10-CM | POA: Diagnosis not present

## 2015-11-08 ENCOUNTER — Encounter: Payer: Medicare Other | Admitting: Cardiovascular Disease

## 2015-11-08 DIAGNOSIS — N2581 Secondary hyperparathyroidism of renal origin: Secondary | ICD-10-CM | POA: Diagnosis not present

## 2015-11-08 DIAGNOSIS — D631 Anemia in chronic kidney disease: Secondary | ICD-10-CM | POA: Diagnosis not present

## 2015-11-08 DIAGNOSIS — N186 End stage renal disease: Secondary | ICD-10-CM | POA: Diagnosis not present

## 2015-11-08 DIAGNOSIS — E1122 Type 2 diabetes mellitus with diabetic chronic kidney disease: Secondary | ICD-10-CM | POA: Diagnosis not present

## 2015-11-10 ENCOUNTER — Encounter: Payer: Self-pay | Admitting: Surgery

## 2015-11-10 DIAGNOSIS — D631 Anemia in chronic kidney disease: Secondary | ICD-10-CM | POA: Diagnosis not present

## 2015-11-10 DIAGNOSIS — E1122 Type 2 diabetes mellitus with diabetic chronic kidney disease: Secondary | ICD-10-CM | POA: Diagnosis not present

## 2015-11-10 DIAGNOSIS — N2581 Secondary hyperparathyroidism of renal origin: Secondary | ICD-10-CM | POA: Diagnosis not present

## 2015-11-10 DIAGNOSIS — N186 End stage renal disease: Secondary | ICD-10-CM | POA: Diagnosis not present

## 2015-11-11 ENCOUNTER — Ambulatory Visit (INDEPENDENT_AMBULATORY_CARE_PROVIDER_SITE_OTHER): Payer: Medicare Other | Admitting: Podiatry

## 2015-11-11 ENCOUNTER — Telehealth: Payer: Self-pay | Admitting: *Deleted

## 2015-11-11 ENCOUNTER — Encounter: Payer: Self-pay | Admitting: Podiatry

## 2015-11-11 VITALS — BP 142/86 | HR 69 | Resp 12

## 2015-11-11 DIAGNOSIS — R0989 Other specified symptoms and signs involving the circulatory and respiratory systems: Secondary | ICD-10-CM | POA: Diagnosis not present

## 2015-11-11 DIAGNOSIS — E1149 Type 2 diabetes mellitus with other diabetic neurological complication: Secondary | ICD-10-CM | POA: Diagnosis not present

## 2015-11-11 DIAGNOSIS — E11621 Type 2 diabetes mellitus with foot ulcer: Secondary | ICD-10-CM

## 2015-11-11 DIAGNOSIS — L97529 Non-pressure chronic ulcer of other part of left foot with unspecified severity: Secondary | ICD-10-CM

## 2015-11-11 MED ORDER — DOXYCYCLINE HYCLATE 100 MG PO TABS: 100.0000 mg | ORAL_TABLET | Freq: Two times a day (BID) | ORAL | Status: DC

## 2015-11-11 NOTE — Telephone Encounter (Signed)
Pt's dtr, Dossie Arbour states pt lives a [facility], and needs her rx sent to [facility] by hard copy.  Faxed rx to [phone]. Note to Dr. Jacqualyn Posey to print all pt rx.

## 2015-11-12 DIAGNOSIS — D631 Anemia in chronic kidney disease: Secondary | ICD-10-CM | POA: Diagnosis not present

## 2015-11-12 DIAGNOSIS — E1122 Type 2 diabetes mellitus with diabetic chronic kidney disease: Secondary | ICD-10-CM | POA: Diagnosis not present

## 2015-11-12 DIAGNOSIS — N186 End stage renal disease: Secondary | ICD-10-CM | POA: Diagnosis not present

## 2015-11-12 DIAGNOSIS — N2581 Secondary hyperparathyroidism of renal origin: Secondary | ICD-10-CM | POA: Diagnosis not present

## 2015-11-12 DIAGNOSIS — Z992 Dependence on renal dialysis: Secondary | ICD-10-CM | POA: Diagnosis not present

## 2015-11-14 NOTE — Progress Notes (Signed)
Subjective: 70 year old female presents the office today with her daughter for follow-up evaluation of wounds to both of her big and fifth toe on the left foot.  She states that she believes the black spot on the fifth toe is new as she has not noticed it before. She denies any drainage or pus. Denies any swelling to the foot. Since last the point she did have vascular studies performed and she has a follow-up appointment vascular surgery as well as Dr. Gwenlyn Found.  She denies systemic complaints such as fevers, chills, nausea, vomiting. No calf pain, chest pain, shortness of breath.  Objective: AAO 3, NAD DP/PT pulses decrease, CRT somewhat delayed Protective sensation decreased with Simms Weinstein monofilament, vibratory sensation decreased. Previous amputations of the right second through fifth digits. There is no open lesions identified on the right foot. There is no pre-ulcerative lesions on the right foot. On the left foot on the dorsal aspect of the hallux is an overlying superficial periwound with a  overlying eschar measuring 3 x 1.8 cm and along the lateral aspect of the fifth digit of a similar lesion which appears to be superficial with overlying eschar measuring 0.9 x 0.9 cm. both these lesions of the same lesions of the been present the last appointment. There are no new sores identified at this time.  There is a small amount of epidermal lysis around each of these lesions with hyperkerotic periwound. There is no drainage or pus expressed. There is no surrounding erythema, ascending cellulitis, fluctuance, crepitus, malodor, drainage/purulence. There are no other open lesions or pre-ulcerative lesions identified this time. No areas of tenderness bilateral lower extremities.  There is no edema, erythema, increased warmth. calf compression, swelling, warmth, erythema.  Assessment: 70 year old female left first and fifth dorsal ulcerations/eschar, currently without signs of  infection.  Plan: -Treatment options discussed including all alternatives, risks, and complications -Etiology of symptoms were discussed with the patients daughter.  -Continue with daily dressing changes.  -Doxycycline.  -She has follow-up appointments apparently scheduled for both vascular surgery and Dr. Gwenlyn Found for next week. She has already have vascular studies done -Monitor for any clinical signs or symptoms of infection and directed to call the office immediately should any occur or go to the ER. -Follow-up as scheduled or sooner if any problems arise. In the meantime, encouraged to call the office with any questions, concerns, change in symptoms.   Celesta Gentile, DPM

## 2015-11-15 ENCOUNTER — Telehealth: Payer: Self-pay | Admitting: Cardiovascular Disease

## 2015-11-15 DIAGNOSIS — D509 Iron deficiency anemia, unspecified: Secondary | ICD-10-CM | POA: Diagnosis not present

## 2015-11-15 DIAGNOSIS — N2581 Secondary hyperparathyroidism of renal origin: Secondary | ICD-10-CM | POA: Diagnosis not present

## 2015-11-15 DIAGNOSIS — E1322 Other specified diabetes mellitus with diabetic chronic kidney disease: Secondary | ICD-10-CM | POA: Diagnosis not present

## 2015-11-15 DIAGNOSIS — D631 Anemia in chronic kidney disease: Secondary | ICD-10-CM | POA: Diagnosis not present

## 2015-11-15 DIAGNOSIS — N186 End stage renal disease: Secondary | ICD-10-CM | POA: Diagnosis not present

## 2015-11-15 NOTE — Telephone Encounter (Signed)
Spoke with pt dtr, her mother has an appt with [place] tomorrow regarding the blood circulation in her legs and to f/u dopplers. She also has an appt with [facility] on Friday but that appt is for the fistula for dialysis. They want to make sure if dr berry feels he can not help with the legs that [facility] is told so they can take care of the legs also when they see her Friday. Will make kim aware.

## 2015-11-15 NOTE — Telephone Encounter (Signed)
Please call,pt is coming in tomorrow and she might not be able to come. She have somethings she wants to discuss with you.

## 2015-11-16 ENCOUNTER — Ambulatory Visit (INDEPENDENT_AMBULATORY_CARE_PROVIDER_SITE_OTHER): Payer: Medicare Other | Admitting: Cardiovascular Disease

## 2015-11-16 ENCOUNTER — Encounter: Payer: Self-pay | Admitting: Cardiovascular Disease

## 2015-11-16 VITALS — BP 140/62 | Ht 65.0 in | Wt 126.0 lb

## 2015-11-16 DIAGNOSIS — I70229 Atherosclerosis of native arteries of extremities with rest pain, unspecified extremity: Secondary | ICD-10-CM | POA: Insufficient documentation

## 2015-11-16 DIAGNOSIS — I10 Essential (primary) hypertension: Secondary | ICD-10-CM

## 2015-11-16 DIAGNOSIS — I998 Other disorder of circulatory system: Secondary | ICD-10-CM | POA: Diagnosis not present

## 2015-11-16 NOTE — Patient Instructions (Signed)
Medication Instructions:  Your physician recommends that you continue on your current medications as directed. Please refer to the Current Medication list given to you today.   Labwork: Your physician recommends that you return for lab work in: 7 days before procedure The lab can be found on the FIRST FLOOR of out building in [address]   Testing/Procedures: Dr. Gwenlyn Found has ordered a peripheral angiogram to be done at [facility].  This procedure is going to look at the bloodflow in your lower extremities.  If Dr. Gwenlyn Found is able to open up the arteries, you will have to spend one night in the hospital.  If he is not able to open the arteries, you will be able to go home that same day.    After the procedure, you will not be allowed to drive for 3 days or push, pull, or lift anything greater than 10 lbs for one week.    You will be required to have the following tests prior to the procedure:  1. Blood work-the blood work can be done no more than 7 days prior to the procedure.  It can be done at any [facility] lab.  There is one downstairs on the first floor of this building and one in the [facility] building [phone] [address]. [address], [address])  2. Chest Xray-the chest xray order has already been placed at the [place].     *REPS: Scott  Puncture site: Right Groin     Any Other Special Instructions Will Be Listed Below (If Applicable).     If you need a refill on your cardiac medications before your next appointment, please call your pharmacy.

## 2015-11-16 NOTE — Progress Notes (Signed)
11/16/2015 Deborah Hendricks   05-31-1946  [id number]  Primary Physician Rexene Agent, MD Primary Cardiologist: Lorretta Harp MD Renae Gloss   HPI:  Deborah Hendricks is a 70 year old divorced African-American female mother of 2, grandmother 2 grandchildren who currently lives in a nursing home. She was referred by Dr. Jacqualyn Posey, her podiatrist for evaluation of potential treatment of critical limb ischemia. Her cardiovascular risk factor profile is notable for 25 pack years of tobacco abuse having quit 4 years ago, treated hypertension, diabetes and hyperlipidemia. She has had 2 strokes the past. She's never had a heart attack and specifically denies chest pain, shortness of breath or claudication. She has chronic renal insufficiency on dialysis last 2 years with multiple failed fistulas that with a permacath in her right thigh. She has nonhealing wounds on her left first and fifth toes which are not healing. Dopplers suggest a left ABI 0.46 with occluded left popliteal and TIBIAL VESSELS.   Current Outpatient Prescriptions  Medication Sig Dispense Refill  . amLODipine (NORVASC) 10 MG tablet Take 10 mg by mouth daily.    . carvedilol (COREG) 25 MG tablet Take 25 mg by mouth daily.    . clopidogrel (PLAVIX) 75 MG tablet Take 75 mg by mouth daily with breakfast.    . collagenase (SANTYL) ointment Apply 1 application topically daily. 15 g 0  . doxycycline (VIBRA-TABS) 100 MG tablet Take 1 tablet (100 mg total) by mouth 2 (two) times daily. 20 tablet 0  . insulin aspart (NOVOLOG) 100 UNIT/ML injection Inject 3-6 Units into the skin 3 (three) times daily before meals.     . insulin glargine (LANTUS) 100 UNIT/ML injection Inject 10 Units into the skin at bedtime.     [name] PARoxetine (PAXIL) 40 MG tablet Take 40 mg by mouth every morning.    . pravastatin (PRAVACHOL) 40 MG tablet Take 40 mg by mouth daily.    . silver sulfADIAZINE (SILVADENE) 1 % cream Apply 1 application topically daily. 50 g  0   No current facility-administered medications for this visit.    Allergies  Allergen Reactions  . Penicillins Hives, Itching and Rash    Social History   Social History  . Marital Status: Single    Spouse Name: N/A  . Number of Children: N/A  . Years of Education: N/A   Occupational History  . Not on file.   Social History Main Topics  . Smoking status: Current Every Day Smoker  . Smokeless tobacco: Never Used  . Alcohol Use: No  . Drug Use: No  . Sexual Activity: Not on file   Other Topics Concern  . Not on file   Social History Narrative     Review of Systems: General: negative for chills, fever, night sweats or weight changes.  Cardiovascular: negative for chest pain, dyspnea on exertion, edema, orthopnea, palpitations, paroxysmal nocturnal dyspnea or shortness of breath Dermatological: negative for rash Respiratory: negative for cough or wheezing Urologic: negative for hematuria Abdominal: negative for nausea, vomiting, diarrhea, bright red blood per rectum, melena, or hematemesis Neurologic: negative for visual changes, syncope, or dizziness All other systems reviewed and are otherwise negative except as noted above.    Blood pressure 140/62, height 5\' 5"  (1.651 m), weight 126 lb (57.153 kg).  General appearance: alert and no distress Neck: no adenopathy, no carotid bruit, no JVD, supple, symmetrical, trachea midline and thyroid not enlarged, symmetric, no tenderness/mass/nodules Lungs: clear to auscultation bilaterally Heart: regular rate and rhythm, S1,  S2 normal, no murmur, click, rub or gallop Extremities: extremities normal, atraumatic, no cyanosis or edema and 4 toes on the right foot amputated remotely, ischemic ulcer on the left first and fifth toes  EKG sinus rhythm at 73 with septal Q waves consistent with old anteroseptal/anterolateral myocardial infarction. I personally reviewed this EKG  ASSESSMENT AND PLAN:   Critical lower limb  ischemia History sig was referred to me by Dr. Jacqualyn Posey for evaluation of potential treatment of critical limb ischemia. She is a 70 year old  Afro-American female with a history of remote tobacco abuse, treated hypertension, diabetes and hyperlipidemia. She's been on hemodialysis for the last 2 years. She has diabetic peripheral neuropathy. She has an ischemic-appearing ulcer on her left hallux and fifth toe over the last several months which are not healing. She had Dopplers performed in our office 10/31/15 revealing a right ABI 0.94 with 1 vessel runoff via the peroneal and left ABI 0.46 with an occluded popliteal and all 3 tibial vessels. She is on hemodialysis. I suspect she will not heal her wounds without adequate circulation. I also suspect that the likelihood be able to percutaneously revascularize her and establish Flow to her foot is fairly small. In the absence of this, she will most likely need a left BKA. Given this, I'm going to arrange for undergo angiography. Right femoral approach. I do realize she has a permacath in her right thigh and is scheduled to see the [facility] this week for consideration of a fistula.      Lorretta Harp MD FACP,FACC,FAHA, [facility] 11/16/2015 1:58 PM

## 2015-11-16 NOTE — Assessment & Plan Note (Signed)
History sig was referred to me by Dr. Jacqualyn Posey for evaluation of potential treatment of critical limb ischemia. She is a 70 year old  Afro-American female with a history of remote tobacco abuse, treated hypertension, diabetes and hyperlipidemia. She's been on hemodialysis for the last 2 years. She has diabetic peripheral neuropathy. She has an ischemic-appearing ulcer on her left hallux and fifth toe over the last several months which are not healing. She had Dopplers performed in our office 10/31/15 revealing a right ABI 0.94 with 1 vessel runoff via the peroneal and left ABI 0.46 with an occluded popliteal and all 3 tibial vessels. She is on hemodialysis. I suspect she will not heal her wounds without adequate circulation. I also suspect that the likelihood be able to percutaneously revascularize her and establish Flow to her foot is fairly small. In the absence of this, she will most likely need a left BKA. Given this, I'm going to arrange for undergo angiography. Right femoral approach. I do realize she has a permacath in her right thigh and is scheduled to see the VSS this week for consideration of a fistula.

## 2015-11-17 ENCOUNTER — Telehealth: Payer: Self-pay

## 2015-11-17 DIAGNOSIS — E1322 Other specified diabetes mellitus with diabetic chronic kidney disease: Secondary | ICD-10-CM | POA: Diagnosis not present

## 2015-11-17 DIAGNOSIS — N2581 Secondary hyperparathyroidism of renal origin: Secondary | ICD-10-CM | POA: Diagnosis not present

## 2015-11-17 DIAGNOSIS — D509 Iron deficiency anemia, unspecified: Secondary | ICD-10-CM | POA: Diagnosis not present

## 2015-11-17 DIAGNOSIS — D631 Anemia in chronic kidney disease: Secondary | ICD-10-CM | POA: Diagnosis not present

## 2015-11-17 DIAGNOSIS — N186 End stage renal disease: Secondary | ICD-10-CM | POA: Diagnosis not present

## 2015-11-17 NOTE — Telephone Encounter (Signed)
Called pt daughter, okay per DPR, to get name of facility where pt is staying to verify they received copy of pt AVS, as pt was not sent with paperwork for appt.   Pt daughter stated pt is at [facility] facility and that she wants to speak with Dr. Gwenlyn Found, as she was unable to make appt yesterday, and has questions about the procedure and options.  Expressed that I can try to answer and of her questions and will relay the rest to Dr. Gwenlyn Found if I am unable to answer.  Reviewed with her details from [place] and expectations for procedure.  We also discussed lab work and chest xray.  She told me where pt gets dialysis done on [phone] and speak with Otila Kluver, to try to get labs drawn while pt at dialysis on 1/10.  Daughter asked if I could call her back after speaking with them so she will know how to move forward with getting lab work.  Daughter was pleased with information provided and did not have additional questions at this time.   Otila Kluver not there today but spoke with Margreta Journey, who reviewed needed labs with clinical manager, stated they cannot draw labs as they are not dialysis related and they would not get paid for them by insurance. Daughter contacted and stated she will contact them directly to see if they will reconsider otherwise she will take pt to lab next week.   She also stated she will get Xray done tomorrow while out [facility] appt.

## 2015-11-18 ENCOUNTER — Other Ambulatory Visit: Payer: Self-pay

## 2015-11-18 ENCOUNTER — Encounter: Payer: Self-pay | Admitting: Surgery

## 2015-11-18 ENCOUNTER — Ambulatory Visit (INDEPENDENT_AMBULATORY_CARE_PROVIDER_SITE_OTHER): Payer: Medicare Other | Admitting: Surgery

## 2015-11-18 ENCOUNTER — Ambulatory Visit (HOSPITAL_COMMUNITY)
Admission: RE | Admit: 2015-11-18 | Discharge: 2015-11-18 | Disposition: A | Discharge status: Home / Self Care | Payer: Medicare Other | Source: Ambulatory Visit | Attending: Surgery | Admitting: Surgery

## 2015-11-18 ENCOUNTER — Ambulatory Visit (HOSPITAL_COMMUNITY)
Admission: RE | Admit: 2015-11-18 | Discharge: 2015-11-18 | Disposition: A | Discharge status: Home / Self Care | Payer: Medicare Other | Source: Ambulatory Visit | Attending: Vascular Surgery | Admitting: Vascular Surgery

## 2015-11-18 VITALS — BP 154/77 | HR 72 | Temp 98.8°F | Resp 14 | Ht 65.0 in | Wt 125.0 lb

## 2015-11-18 DIAGNOSIS — N185 Chronic kidney disease, stage 5: Secondary | ICD-10-CM | POA: Insufficient documentation

## 2015-11-18 DIAGNOSIS — Z01810 Encounter for preprocedural cardiovascular examination: Secondary | ICD-10-CM | POA: Insufficient documentation

## 2015-11-18 DIAGNOSIS — E1122 Type 2 diabetes mellitus with diabetic chronic kidney disease: Secondary | ICD-10-CM | POA: Insufficient documentation

## 2015-11-18 DIAGNOSIS — N186 End stage renal disease: Secondary | ICD-10-CM | POA: Diagnosis not present

## 2015-11-18 DIAGNOSIS — I120 Hypertensive chronic kidney disease with stage 5 chronic kidney disease or end stage renal disease: Secondary | ICD-10-CM | POA: Insufficient documentation

## 2015-11-18 NOTE — Progress Notes (Signed)
Filed Vitals:   11/18/15 0908 11/18/15 0910  BP: 146/76 154/77  Pulse: 75 72  Temp: 98.8 F (37.1 C)   Resp: 14   Height: 5\' 5"  (1.651 m)   Weight: 125 lb (56.7 kg)   SpO2: 100%

## 2015-11-18 NOTE — Progress Notes (Signed)
Patient name: Deborah Hendricks MRN: [id number] DOB: 07-09-1946 Sex: female   Referred by: RENAL   Reason for referral:  Chief Complaint  Patient presents with  . New Evaluation    eval for permanent access    HISTORY OF PRESENT ILLNESS:  this is a very pleasant 70 year old female who comes in today with her  Daughter for discussions of dialysis access.  She has a history of a left upper arm graft placed in [place].  She currently has a right femoral catheter in place.  She dialyzes on Tuesday Thursday Saturday.    the patient suffers from peripheral vascular disease.  She is undergone multiple toe amputations on the right.  She recently saw Dr. Gwenlyn Found with dry gangrene to the left great toe and fifth toe.  She is scheduled for angiography on Monday, January 16.  She has significant outflow and runoff disease by ultrasound.   The patient suffers from diabetes which has not been well controlled.  She is medically managed for hypertension.  She has a history of stroke.  She has been on Coumadin in the past but is now on Plavix.  Past Medical History  Diagnosis Date  . Diabetes mellitus without complication ([place])   . Kidney disease   . Hypertension   . Anemia   . Stroke ([place])   . Critical lower limb ischemia     Past Surgical History  Procedure Laterality Date  . Toe amputation      R foot  . Abdominal hysterectomy      Social History   Social History  . Marital Status: Single    Spouse Name: N/A  . Number of Children: N/A  . Years of Education: N/A   Occupational History  . Not on file.   Social History Main Topics  . Smoking status: Former Smoker    Quit date: 11/17/2014  . Smokeless tobacco: Never Used  . Alcohol Use: No  . Drug Use: No  . Sexual Activity: Not on file   Other Topics Concern  . Not on file   Social History Narrative    Family History  Problem Relation Age of Onset  . Asthma Mother   . Cancer Sister     Allergies as of  11/18/2015 - Review Complete 11/18/2015  Allergen Reaction Noted  . Penicillins Hives, Itching, and Rash 01/28/2014    Current Outpatient Prescriptions on File Prior to Visit  Medication Sig Dispense Refill  . amLODipine (NORVASC) 10 MG tablet Take 10 mg by mouth daily.    . carvedilol (COREG) 25 MG tablet Take 25 mg by mouth daily.    . clopidogrel (PLAVIX) 75 MG tablet Take 75 mg by mouth daily with breakfast.    . collagenase (SANTYL) ointment Apply 1 application topically daily. 15 g 0  . doxycycline (VIBRA-TABS) 100 MG tablet Take 1 tablet (100 mg total) by mouth 2 (two) times daily. 20 tablet 0  . insulin aspart (NOVOLOG) 100 UNIT/ML injection Inject 3-6 Units into the skin 3 (three) times daily before meals.     . insulin glargine (LANTUS) 100 UNIT/ML injection Inject 10 Units into the skin at bedtime.     [name] PARoxetine (PAXIL) 40 MG tablet Take 40 mg by mouth every morning.    . pravastatin (PRAVACHOL) 40 MG tablet Take 40 mg by mouth daily.    . silver sulfADIAZINE (SILVADENE) 1 % cream Apply 1 application topically daily. 50 g 0   No current facility-administered  medications on file prior to visit.     REVIEW OF SYSTEMS: Cardiovascular: No chest pain, chest pressure, palpitations, orthopnea, or dyspnea on exertion. Pulmonary: No productive cough, asthma or wheezing. Neurologic: No weakness, paresthesias, aphasia, or amaurosis. No dizziness. Hematologic: No bleeding problems or clotting disorders. Musculoskeletal: No joint pain or joint swelling. History of palpitations on the right Gastrointestinal: No blood in stool or hematemesis Genitourinary: No dysuria or hematuria. Psychiatric:: No history of major depression. Integumentary: left toe ulcer Constitutional: No fever or chills.  PHYSICAL EXAMINATION:  Filed Vitals:   11/18/15 0908 11/18/15 0910  BP: 146/76 154/77  Pulse: 75 72  Temp: 98.8 F (37.1 C)   Resp: 14   Height: 5\' 5"  (1.651 m)   Weight: 125 lb (56.7  kg)   SpO2: 100%    Body mass index is 20.8 kg/(m^2). General: The patient appears their stated age.   HEENT:  No gross abnormalities Pulmonary: Respirations are non-labored Musculoskeletal: There are no major deformities.   Neurologic: No focal weakness or paresthesias are detected, Skin:  Dry gangrene to the left great toe and lateral side of the left fifth toe. Psychiatric: The patient has normal affect. Cardiovascular: There is a regular rate and rhythm without significant murmur appreciated. Palpable right radial pulse.  Nonpalpable pedal pulses  Diagnostic Studies:  I have ordered and reviewed her vein mapping. She has an adequate right basilic vein.  Measures from 0.34 in the proximal forearm up to 0.5 in the upper arm.    Assessment:   #1: End-stage renal disease #2: atherosclerosis with lower sternum the ulcer Plan:  #1: based on the patient's vein mapping, I feel that she is a candidate for a staged basilic vein transposition. She has adequate basilic vein diameters.  I have scheduled this for Friday January 13.  She will need to be off of her Plavix for 5 days.  We discussed the risk of non-maturity, the need for a second stage procedure if the vein matures, and the risk of steal syndrome.   #2: the patient is scheduled for angiography with Dr. Gwenlyn Found to evaluate her lower extremity vascular disease.  She is at high risk for limb loss     V. Leia Alf, M.D. [facility] [place] Office: [phone] Pager:  [phone]

## 2015-11-19 DIAGNOSIS — N2581 Secondary hyperparathyroidism of renal origin: Secondary | ICD-10-CM | POA: Diagnosis not present

## 2015-11-19 DIAGNOSIS — D509 Iron deficiency anemia, unspecified: Secondary | ICD-10-CM | POA: Diagnosis not present

## 2015-11-19 DIAGNOSIS — D631 Anemia in chronic kidney disease: Secondary | ICD-10-CM | POA: Diagnosis not present

## 2015-11-19 DIAGNOSIS — N186 End stage renal disease: Secondary | ICD-10-CM | POA: Diagnosis not present

## 2015-11-19 DIAGNOSIS — E1322 Other specified diabetes mellitus with diabetic chronic kidney disease: Secondary | ICD-10-CM | POA: Diagnosis not present

## 2015-11-22 ENCOUNTER — Telehealth: Payer: Self-pay | Admitting: Cardiovascular Disease

## 2015-11-22 DIAGNOSIS — N2581 Secondary hyperparathyroidism of renal origin: Secondary | ICD-10-CM | POA: Diagnosis not present

## 2015-11-22 DIAGNOSIS — N186 End stage renal disease: Secondary | ICD-10-CM | POA: Diagnosis not present

## 2015-11-22 DIAGNOSIS — E1322 Other specified diabetes mellitus with diabetic chronic kidney disease: Secondary | ICD-10-CM | POA: Diagnosis not present

## 2015-11-22 DIAGNOSIS — D509 Iron deficiency anemia, unspecified: Secondary | ICD-10-CM | POA: Diagnosis not present

## 2015-11-22 DIAGNOSIS — Z01818 Encounter for other preprocedural examination: Secondary | ICD-10-CM | POA: Diagnosis not present

## 2015-11-22 DIAGNOSIS — D631 Anemia in chronic kidney disease: Secondary | ICD-10-CM | POA: Diagnosis not present

## 2015-11-22 NOTE — Telephone Encounter (Signed)
Per The Answering Service from yesterday-Received this message today.-Please call,needs clarification on labs and xray.

## 2015-11-22 NOTE — Telephone Encounter (Signed)
Calling to verify lab work needed and when it needed to be complete.  Deborah Hendricks explained labs will be done onsite on 1/11 and results should be faxed to nurse attention on 1/12 Chest X-ray can be completed on site and will also be faxed directly to nurse attention to be scanned into system and reviewed before 1/16 PV angio procedure.  Fax information shared and no additional questions at this time.

## 2015-11-22 NOTE — Telephone Encounter (Signed)
Left Voice Message to call back so we can clarify questions she has about labs and x-ray

## 2015-11-23 DIAGNOSIS — E0822 Diabetes mellitus due to underlying condition with diabetic chronic kidney disease: Secondary | ICD-10-CM | POA: Diagnosis not present

## 2015-11-24 ENCOUNTER — Encounter (HOSPITAL_COMMUNITY): Payer: Self-pay | Admitting: *Deleted

## 2015-11-24 DIAGNOSIS — D509 Iron deficiency anemia, unspecified: Secondary | ICD-10-CM | POA: Diagnosis not present

## 2015-11-24 DIAGNOSIS — E1322 Other specified diabetes mellitus with diabetic chronic kidney disease: Secondary | ICD-10-CM | POA: Diagnosis not present

## 2015-11-24 DIAGNOSIS — N2581 Secondary hyperparathyroidism of renal origin: Secondary | ICD-10-CM | POA: Diagnosis not present

## 2015-11-24 DIAGNOSIS — N186 End stage renal disease: Secondary | ICD-10-CM | POA: Diagnosis not present

## 2015-11-24 DIAGNOSIS — D631 Anemia in chronic kidney disease: Secondary | ICD-10-CM | POA: Diagnosis not present

## 2015-11-24 MED ORDER — VANCOMYCIN HCL IN DEXTROSE 1-5 GM/200ML-% IV SOLN
1000.0000 mg | INTRAVENOUS | Status: AC
  Administered 2015-11-25: 1000 mg via INTRAVENOUS
  Filled 2015-11-24: qty 200

## 2015-11-24 MED ORDER — SODIUM CHLORIDE 0.9 % IV SOLN
INTRAVENOUS | Status: DC
  Administered 2015-11-25 (×2): via INTRAVENOUS

## 2015-11-24 MED ORDER — CHLORHEXIDINE GLUCONATE CLOTH 2 % EX PADS: 6.0000 | MEDICATED_PAD | Freq: Once | CUTANEOUS | Status: DC

## 2015-11-24 NOTE — Progress Notes (Signed)
Anesthesia Chart Review:  Pt is 70 year old female scheduled for first stage R basilic vein transposition on 11/25/2015 with Dr. Trula Slade.   Pt is a same day work up. She is a resident at [facility].   PMH includes:  CVA (03/2012), CHF, HTN, DM, PVD, ESRD on HD, anemia, seizures. Former smoker. BMI 21.  Medications include: carvedilol, nexium, novolog, levemir. Records from [facility] indicate pt is taking eliquis (last dose 11/18/15), not plavix as indicated by Dr. Stephens Shire notes.   Reviewed labs dated 11/23/15 (CBC, BMET, PT/PTT found in media tab) and they are acceptable for surgery.   1 view CXR 11/22/15 (from [facility]): No acute cardiopulmonary disease  EKG 11/16/15: sinus rhythm. Old anteroseptal/anterolateral infarction.   Echo 04/01/12 [facility]):  1. Normal LV size and function, overall EF 60% 2. Biatrial enlargement 3. Mild pulmonary HTN  Carotid duplex US 04/01/12: B ICA stenosis 0-39%  If no changes, I anticipate pt can proceed with surgery as scheduled.   Willeen Cass, FNP-BC [facility]/Anesthesiology Phone: [phone] 11/24/2015 1:48 PM

## 2015-11-24 NOTE — Progress Notes (Signed)
I spoke to Seth Ward at [facility], patients daughter Dossie Arbour (patients POA) and the patient.  Deana instructed me to call Mrs Gwilt for information.  ( there is a comment under contacts to call [place] for eveyrthing, however Ms Dahlia Client is not aware where that information came from.  Mrs Egge reported that she had a stroke in June of 2016 and had a seizure during that time.  I do not find information regarding stroke or  Seizure in Dr Kennon Holter or Dr Stephens Shire history.  I faxed a request to [facility] in [place] , [place] requesting records.  Medication List from [facility] has that patient is taking Eliqus, Dr Eduardo Osier and Dr Stephens Shire notes have patient taking Plavix.  Eliqus has been on hold since Friday, November 18, 2015 per patients nurse at [facility].  I left a message at [facility] checking to see if patient is suppose to be on Plavix.

## 2015-11-24 NOTE — Pre-Procedure Instructions (Signed)
    Chardae Buczek  11/24/2015      Your procedure is scheduled on Friday, January 13.  Report to [facility] Admitting at 5:30A.M.             Call this number if you have problems or questions the morning of surgery: [phone]   Remember:  Do not eat food or drink liquids after midnight.   Take these medicines the morning of surgery with A SIP OF WATER : amLODipine (NORVASC), carvedilol (COREG), PARoxetine (PAXIL), pravastatin (PRAVACHOL).                   Plavix should be stopped 5 days prior to surgery as instructed by Dr Trula Slade.       Insulin the night before ar instructed by Dr Trula Slade or follow the Instructions  THE NIGHT BEFORE SURGERY, take 8 units  Lantus Insulin  THE MORNING OF SURGERY If your CBG is greater than 220 mg/dL, you may take 1/2 of your sliding scale (correction) dose of insulin   If your blood sugar is less than 70 mg/dL, you will need to treat for low blood sugar by:  Treat a low blood sugar (less than 70 mg/dL) with 1/2 cup of clear juice (cranberry or apple), 4 glucose tablets, OR glucose gel.  Recheck blood sugar in 15 minutes after treatment (to make sure it is greater than 70 mg/dL).  If blood sugar is not greater than 70 mg/dL on re-check, call [phone] for further instructions.   Report your blood sugar to the Short-Stay nurse when you get to Short-Stay       Do not wear jewelry, make-up or nail polish.  Do not wear lotions, powders, or perfumes.    Do not shave 48 hours prior to surgery.    Do not bring valuables to the hospital.  [facility] is not responsible for any belongings or valuables.  Contacts, dentures or bridgework may not be worn into surgery.  Leave your suitcase in the car.  After surgery it may be brought to your room.

## 2015-11-25 ENCOUNTER — Other Ambulatory Visit: Payer: Self-pay

## 2015-11-25 ENCOUNTER — Ambulatory Visit (HOSPITAL_COMMUNITY): Payer: Medicare Other | Admitting: Emergency Medicine

## 2015-11-25 ENCOUNTER — Other Ambulatory Visit: Payer: Self-pay | Admitting: *Deleted

## 2015-11-25 ENCOUNTER — Ambulatory Visit (HOSPITAL_COMMUNITY)
Admission: RE | Admit: 2015-11-25 | Discharge: 2015-11-25 | Disposition: A | Discharge status: Home / Self Care | Payer: Medicare Other | Source: Ambulatory Visit | Attending: Surgery | Admitting: Surgery

## 2015-11-25 ENCOUNTER — Encounter (HOSPITAL_COMMUNITY)
Admission: RE | Disposition: A | Discharge status: Home / Self Care | Payer: Self-pay | Source: Ambulatory Visit | Attending: Surgery

## 2015-11-25 DIAGNOSIS — Z4931 Encounter for adequacy testing for hemodialysis: Secondary | ICD-10-CM

## 2015-11-25 DIAGNOSIS — N186 End stage renal disease: Secondary | ICD-10-CM

## 2015-11-25 DIAGNOSIS — D649 Anemia, unspecified: Secondary | ICD-10-CM | POA: Diagnosis not present

## 2015-11-25 DIAGNOSIS — Z794 Long term (current) use of insulin: Secondary | ICD-10-CM | POA: Insufficient documentation

## 2015-11-25 DIAGNOSIS — I739 Peripheral vascular disease, unspecified: Secondary | ICD-10-CM | POA: Diagnosis not present

## 2015-11-25 DIAGNOSIS — I120 Hypertensive chronic kidney disease with stage 5 chronic kidney disease or end stage renal disease: Secondary | ICD-10-CM | POA: Insufficient documentation

## 2015-11-25 DIAGNOSIS — Z87891 Personal history of nicotine dependence: Secondary | ICD-10-CM | POA: Diagnosis not present

## 2015-11-25 DIAGNOSIS — Z7902 Long term (current) use of antithrombotics/antiplatelets: Secondary | ICD-10-CM | POA: Diagnosis not present

## 2015-11-25 DIAGNOSIS — M62838 Other muscle spasm: Secondary | ICD-10-CM | POA: Diagnosis not present

## 2015-11-25 DIAGNOSIS — Z7901 Long term (current) use of anticoagulants: Secondary | ICD-10-CM | POA: Diagnosis not present

## 2015-11-25 DIAGNOSIS — Z992 Dependence on renal dialysis: Secondary | ICD-10-CM | POA: Diagnosis not present

## 2015-11-25 DIAGNOSIS — I129 Hypertensive chronic kidney disease with stage 1 through stage 4 chronic kidney disease, or unspecified chronic kidney disease: Secondary | ICD-10-CM | POA: Diagnosis present

## 2015-11-25 DIAGNOSIS — E1122 Type 2 diabetes mellitus with diabetic chronic kidney disease: Secondary | ICD-10-CM | POA: Insufficient documentation

## 2015-11-25 DIAGNOSIS — Z8673 Personal history of transient ischemic attack (TIA), and cerebral infarction without residual deficits: Secondary | ICD-10-CM | POA: Insufficient documentation

## 2015-11-25 DIAGNOSIS — I708 Atherosclerosis of other arteries: Secondary | ICD-10-CM | POA: Insufficient documentation

## 2015-11-25 DIAGNOSIS — I509 Heart failure, unspecified: Secondary | ICD-10-CM | POA: Diagnosis not present

## 2015-11-25 DIAGNOSIS — N185 Chronic kidney disease, stage 5: Secondary | ICD-10-CM | POA: Diagnosis not present

## 2015-11-25 DIAGNOSIS — I998 Other disorder of circulatory system: Secondary | ICD-10-CM

## 2015-11-25 DIAGNOSIS — I70229 Atherosclerosis of native arteries of extremities with rest pain, unspecified extremity: Secondary | ICD-10-CM

## 2015-11-25 HISTORY — DX: Constipation, unspecified: K59.00

## 2015-11-25 HISTORY — PX: BASCILIC VEIN TRANSPOSITION: SHX5742

## 2015-11-25 HISTORY — DX: Depression, unspecified: F32.A

## 2015-11-25 HISTORY — DX: Other amnesia: R41.3

## 2015-11-25 HISTORY — DX: Peripheral vascular disease, unspecified: I73.9

## 2015-11-25 HISTORY — DX: Anxiety disorder, unspecified: F41.9

## 2015-11-25 HISTORY — DX: Unspecified convulsions: R56.9

## 2015-11-25 HISTORY — DX: Heart failure, unspecified: I50.9

## 2015-11-25 HISTORY — DX: Major depressive disorder, single episode, unspecified: F32.9

## 2015-11-25 LAB — POCT I-STAT 4, (NA,K, GLUC, HGB,HCT)
GLUCOSE: 222 mg/dL — AB (ref 65–99)
HCT: 41 % (ref 36.0–46.0)
Hemoglobin: 13.9 g/dL (ref 12.0–15.0)
POTASSIUM: 4.9 mmol/L (ref 3.5–5.1)
Sodium: 138 mmol/L (ref 135–145)

## 2015-11-25 LAB — GLUCOSE, CAPILLARY: GLUCOSE-CAPILLARY: 182 mg/dL — AB (ref 65–99)

## 2015-11-25 LAB — NO BLOOD PRODUCTS

## 2015-11-25 SURGERY — TRANSPOSITION, VEIN, BASILIC
Anesthesia: General | Site: Arm Upper | Laterality: Right

## 2015-11-25 MED ORDER — PHENYLEPHRINE HCL 10 MG/ML IJ SOLN
INTRAMUSCULAR | Status: DC | PRN
  Administered 2015-11-25 (×2): 80 ug via INTRAVENOUS

## 2015-11-25 MED ORDER — FENTANYL CITRATE (PF) 250 MCG/5ML IJ SOLN
INTRAMUSCULAR | Status: DC | PRN
  Administered 2015-11-25 (×2): 25 ug via INTRAVENOUS

## 2015-11-25 MED ORDER — EPHEDRINE SULFATE 50 MG/ML IJ SOLN
INTRAMUSCULAR | Status: AC
  Filled 2015-11-25: qty 1

## 2015-11-25 MED ORDER — LIDOCAINE HCL (CARDIAC) 20 MG/ML IV SOLN
INTRAVENOUS | Status: AC
Start: 2015-11-25 — End: 2015-11-25
  Filled 2015-11-25: qty 5

## 2015-11-25 MED ORDER — 0.9 % SODIUM CHLORIDE (POUR BTL) OPTIME
TOPICAL | Status: DC | PRN
  Administered 2015-11-25: 1000 mL

## 2015-11-25 MED ORDER — PHENYLEPHRINE HCL 10 MG/ML IJ SOLN
10.0000 mg | INTRAVENOUS | Status: DC | PRN
  Administered 2015-11-25: 50 ug/min via INTRAVENOUS

## 2015-11-25 MED ORDER — PROPOFOL 10 MG/ML IV BOLUS
INTRAVENOUS | Status: DC | PRN
  Administered 2015-11-25: 50 mg via INTRAVENOUS
  Administered 2015-11-25: 100 mg via INTRAVENOUS

## 2015-11-25 MED ORDER — FENTANYL CITRATE (PF) 250 MCG/5ML IJ SOLN
INTRAMUSCULAR | Status: AC
  Filled 2015-11-25: qty 5

## 2015-11-25 MED ORDER — OXYCODONE-ACETAMINOPHEN 5-325 MG PO TABS: 1.0000 | ORAL_TABLET | ORAL | Status: DC | PRN

## 2015-11-25 MED ORDER — ONDANSETRON HCL 4 MG/2ML IJ SOLN
INTRAMUSCULAR | Status: DC | PRN
  Administered 2015-11-25: 4 mg via INTRAVENOUS

## 2015-11-25 MED ORDER — PHENYLEPHRINE 40 MCG/ML (10ML) SYRINGE FOR IV PUSH (FOR BLOOD PRESSURE SUPPORT)
PREFILLED_SYRINGE | INTRAVENOUS | Status: AC
  Filled 2015-11-25: qty 10

## 2015-11-25 MED ORDER — LIDOCAINE-EPINEPHRINE (PF) 1 %-1:200000 IJ SOLN
INTRAMUSCULAR | Status: DC | PRN
  Administered 2015-11-25: 5 mL

## 2015-11-25 MED ORDER — HEPARIN SODIUM (PORCINE) 1000 UNIT/ML IJ SOLN
INTRAMUSCULAR | Status: DC | PRN
  Administered 2015-11-25: 3000 [IU] via INTRAVENOUS

## 2015-11-25 MED ORDER — LIDOCAINE-EPINEPHRINE (PF) 1 %-1:200000 IJ SOLN
INTRAMUSCULAR | Status: AC
  Filled 2015-11-25: qty 30

## 2015-11-25 MED ORDER — HEMOSTATIC AGENTS (NO CHARGE) OPTIME
TOPICAL | Status: DC | PRN
  Administered 2015-11-25: 1 via TOPICAL

## 2015-11-25 MED ORDER — MIDAZOLAM HCL 2 MG/2ML IJ SOLN
INTRAMUSCULAR | Status: AC
  Filled 2015-11-25: qty 2

## 2015-11-25 MED ORDER — ROCURONIUM BROMIDE 50 MG/5ML IV SOLN
INTRAVENOUS | Status: AC
  Filled 2015-11-25: qty 1

## 2015-11-25 MED ORDER — SODIUM CHLORIDE 0.9 % IV SOLN
INTRAVENOUS | Status: DC | PRN
  Administered 2015-11-25: 500 mL

## 2015-11-25 MED ORDER — SUCCINYLCHOLINE CHLORIDE 20 MG/ML IJ SOLN
INTRAMUSCULAR | Status: AC
  Filled 2015-11-25: qty 1

## 2015-11-25 MED ORDER — PROMETHAZINE HCL 25 MG/ML IJ SOLN: 6.2500 mg | INTRAMUSCULAR | Status: DC | PRN

## 2015-11-25 MED ORDER — FENTANYL CITRATE (PF) 100 MCG/2ML IJ SOLN: 25.0000 ug | INTRAMUSCULAR | Status: DC | PRN

## 2015-11-25 MED ORDER — SODIUM CHLORIDE 0.9 % IJ SOLN
INTRAMUSCULAR | Status: AC
  Filled 2015-11-25: qty 10

## 2015-11-25 MED ORDER — LIDOCAINE HCL (CARDIAC) 20 MG/ML IV SOLN
INTRAVENOUS | Status: DC | PRN
  Administered 2015-11-25: 60 mg via INTRAVENOUS

## 2015-11-25 MED ORDER — PROTAMINE SULFATE 10 MG/ML IV SOLN
INTRAVENOUS | Status: DC | PRN
  Administered 2015-11-25: 10 mg via INTRAVENOUS
  Administered 2015-11-25: 15 mg via INTRAVENOUS

## 2015-11-25 MED ORDER — PROPOFOL 10 MG/ML IV BOLUS
INTRAVENOUS | Status: AC
  Filled 2015-11-25: qty 20

## 2015-11-25 SURGICAL SUPPLY — 39 items
CANISTER SUCTION 2500CC (MISCELLANEOUS) ×3 IMPLANT
CLIP TI MEDIUM 24 (CLIP) IMPLANT
CLIP TI MEDIUM 6 (CLIP) IMPLANT
CLIP TI WIDE RED SMALL 24 (CLIP) IMPLANT
CLIP TI WIDE RED SMALL 6 (CLIP) IMPLANT
COVER PROBE W GEL 5X96 (DRAPES) ×6 IMPLANT
ELECT REM PT RETURN 9FT ADLT (ELECTROSURGICAL) ×3
ELECTRODE REM PT RTRN 9FT ADLT (ELECTROSURGICAL) ×1 IMPLANT
GLOVE BIOGEL PI IND STRL 6.5 (GLOVE) ×1 IMPLANT
GLOVE BIOGEL PI IND STRL 7.0 (GLOVE) ×1 IMPLANT
GLOVE BIOGEL PI IND STRL 7.5 (GLOVE) ×3 IMPLANT
GLOVE BIOGEL PI INDICATOR 6.5 (GLOVE) ×2
GLOVE BIOGEL PI INDICATOR 7.0 (GLOVE) ×2
GLOVE BIOGEL PI INDICATOR 7.5 (GLOVE) ×6
GLOVE SKINSENSE NS SZ6.5 (GLOVE) ×2
GLOVE SKINSENSE NS SZ7.0 (GLOVE) ×2
GLOVE SKINSENSE NS SZ7.5 (GLOVE) ×2
GLOVE SKINSENSE STRL SZ6.5 (GLOVE) ×1 IMPLANT
GLOVE SKINSENSE STRL SZ7.0 (GLOVE) ×1 IMPLANT
GLOVE SKINSENSE STRL SZ7.5 (GLOVE) ×1 IMPLANT
GLOVE SURG SS PI 7.5 STRL IVOR (GLOVE) ×3 IMPLANT
GOWN STRL REUS W/ TWL LRG LVL3 (GOWN DISPOSABLE) ×2 IMPLANT
GOWN STRL REUS W/ TWL XL LVL3 (GOWN DISPOSABLE) ×2 IMPLANT
GOWN STRL REUS W/TWL LRG LVL3 (GOWN DISPOSABLE) ×4
GOWN STRL REUS W/TWL XL LVL3 (GOWN DISPOSABLE) ×4
HEMOSTAT SNOW SURGICEL 2X4 (HEMOSTASIS) ×3 IMPLANT
KIT BASIN OR (CUSTOM PROCEDURE TRAY) ×3 IMPLANT
KIT ROOM TURNOVER OR (KITS) ×3 IMPLANT
LIQUID BAND (GAUZE/BANDAGES/DRESSINGS) ×3 IMPLANT
NS IRRIG 1000ML POUR BTL (IV SOLUTION) ×3 IMPLANT
PACK CV ACCESS (CUSTOM PROCEDURE TRAY) ×3 IMPLANT
PAD ARMBOARD 7.5X6 YLW CONV (MISCELLANEOUS) ×6 IMPLANT
SUT PROLENE 6 0 CC (SUTURE) ×9 IMPLANT
SUT SILK 2 0 SH (SUTURE) ×3 IMPLANT
SUT VIC AB 3-0 SH 27 (SUTURE) ×2
SUT VIC AB 3-0 SH 27X BRD (SUTURE) ×1 IMPLANT
SUT VICRYL 4-0 PS2 18IN ABS (SUTURE) IMPLANT
UNDERPAD 30X30 INCONTINENT (UNDERPADS AND DIAPERS) ×3 IMPLANT
WATER STERILE IRR 1000ML POUR (IV SOLUTION) ×3 IMPLANT

## 2015-11-25 NOTE — Anesthesia Procedure Notes (Signed)
Procedure Name: LMA Insertion Date/Time: 11/25/2015 7:37 AM Performed by: Julian Reil Pre-anesthesia Checklist: Patient identified, Emergency Drugs available, Suction available and Patient being monitored Patient Re-evaluated:Patient Re-evaluated prior to inductionOxygen Delivery Method: Circle system utilized Preoxygenation: Pre-oxygenation with 100% oxygen Intubation Type: IV induction Ventilation: Mask ventilation without difficulty LMA: LMA inserted LMA Size: 4.0 Tube type: Oral Number of attempts: 1 Placement Confirmation: positive ETCO2 and breath sounds checked- equal and bilateral Tube secured with: Tape Dental Injury: Teeth and Oropharynx as per pre-operative assessment

## 2015-11-25 NOTE — Anesthesia Preprocedure Evaluation (Signed)
Anesthesia Evaluation  Patient identified by MRN, date of birth, ID band Patient awake    Reviewed: Allergy & Precautions, NPO status , Patient's Chart, lab work & pertinent test results  Airway Mallampati: II  TM Distance: >3 FB Neck ROM: Full    Dental no notable dental hx.    Pulmonary neg pulmonary ROS, former smoker,    Pulmonary exam normal breath sounds clear to auscultation       Cardiovascular hypertension, + Peripheral Vascular Disease  negative cardio ROS Normal cardiovascular exam Rhythm:Regular Rate:Normal     Neuro/Psych negative neurological ROS  negative psych ROS   GI/Hepatic negative GI ROS, Neg liver ROS,   Endo/Other  diabetes  Renal/GU DialysisRenal disease  negative genitourinary   Musculoskeletal negative musculoskeletal ROS (+)   Abdominal   Peds negative pediatric ROS (+)  Hematology negative hematology ROS (+)   Anesthesia Other Findings   Reproductive/Obstetrics negative OB ROS                             Anesthesia Physical Anesthesia Plan  ASA: III  Anesthesia Plan: General   Post-op Pain Management:    Induction: Intravenous  Airway Management Planned: LMA  Additional Equipment:   Intra-op Plan:   Post-operative Plan: Extubation in OR  Informed Consent: I have reviewed the patients History and Physical, chart, labs and discussed the procedure including the risks, benefits and alternatives for the proposed anesthesia with the patient or authorized representative who has indicated his/her understanding and acceptance.   Dental advisory given  Plan Discussed with: CRNA and Surgeon  Anesthesia Plan Comments:         Anesthesia Quick Evaluation

## 2015-11-25 NOTE — Op Note (Signed)
    Patient name: Deborah Hendricks MRN: [id number] DOB: 10-10-1946 Sex: female  11/25/2015 Pre-operative Diagnosis: End-stage renal disease Post-operative diagnosis:  Same Surgeon:  [name] Assistants:  Gerri Lins Procedure:   First stage right basilic vein transposition Anesthesia:  Gen. Blood Loss:  See anesthesia record Specimens:  None  Findings:  Artery was somewhat calcified.  I used a branch of the basilic vein for a end to side anastomosis.  The vein dilated nicely.  Multiphasic Doppler signal at the wrist and the radial artery  Indications:  The patient has a history of a failed left upper arm graft.  She is currently dialyzing through a catheter.  She is here for fistula creation.  Vein mapping identified only a right basilic vein as a potential option for fistula  Procedure:  The patient was identified in the holding area and taken to [place] 16  The patient was then placed supine on the table. general anesthesia was administered.  The patient was prepped and draped in the usual sterile fashion.  A time out was called and antibiotics were administered.  Ultrasound was used to evaluate the basilic and cephalic vein.  The cephalic vein was not appropriate.  The basilic vein appeared adequate in size and caliber.  One percent lidocaine was used for local anesthesia.  An oblique incision was made in the right antecubital region.  Through this incision I dissected out the basilic vein as well as a prominent branch.  The vein measured approximately 3 mm.  I then dissected out the brachial artery which was approximately 2. 5-3 millimeters with moderate calcification.  The vein was then marked for orientation.  3000 units of heparin was administered.  The vein was then ligated distally.  It was distended with heparinized saline.  It distended to about 5 mm.  The artery was then occluded.  A #11 blade was used to make an arteriotomy which was extended longitudinally with Potts scissors.   The vein was cut the appropriate length and a end to side anastomosis was created with running 6-0 proline.  Prior to completion, the appropriate flushing maneuvers were performed.  The anastomosis was completed.  There was bleeding from the heel of the anastomosis.  On further inspection a plaque had poked through the artery.  A horizontal mattress suture was then used to seal the tear.  There remained good flow within the fistula both by palpation and with Doppler.  There was also an excellent radial artery Doppler signal.  25 mg of protamine was given.  Incision was closed with 3-0 and 4-0 Vicryl followed by Dermabond.   Disposition:  To PACU in stable condition.   Theotis Burrow, M.D. [facility] [place] Office: [phone] Pager:  [phone]

## 2015-11-25 NOTE — Transfer of Care (Signed)
Immediate Anesthesia Transfer of Care Note  Patient: Deborah Hendricks  Procedure(s) Performed: Procedure(s): Right Arm FIRST STAGE BASILIC VEIN TRANSPOSITION (Right)  Patient Location: PACU  Anesthesia Type:General  Level of Consciousness: sedated and responds to stimulation  Airway & Oxygen Therapy: Patient Spontanous Breathing and Patient connected to nasal cannula oxygen  Post-op Assessment: Report given to RN and Post -op Vital signs reviewed and stable  Post vital signs: Reviewed and stable  Last Vitals:  Filed Vitals:   11/25/15 0615 11/25/15 0649  BP: 214/91 200/86  Pulse: 78   Temp: 123XX123 C     Complications: No apparent anesthesia complications

## 2015-11-25 NOTE — Progress Notes (Signed)
Right femoral Hemodialysis Catheter venous port deaccessed from being used during surgical procedure. Heparin 1,000 units per mL ( 3 ml used) instilled. Line capped and taped.

## 2015-11-25 NOTE — Interval H&P Note (Signed)
History and Physical Interval Note:  11/25/2015 7:17 AM  Deborah Hendricks  has presented today for surgery, with the diagnosis of End Stage Renal Disease N18.6  The various methods of treatment have been discussed with the patient and family. After consideration of risks, benefits and other options for treatment, the patient has consented to  Procedure(s): FIRST STAGE BASILIC VEIN TRANSPOSITION (Right) as a surgical intervention .  The patient's history has been reviewed, patient examined, no change in status, stable for surgery.  I have reviewed the patient's chart and labs.  Questions were answered to the patient's satisfaction.     [name]

## 2015-11-25 NOTE — H&P (View-Only) (Signed)
Patient name: Deborah Hendricks MRN: [id number] DOB: 1945/11/22 Sex: female   Referred by: RENAL   Reason for referral:  Chief Complaint  Patient presents with  . New Evaluation    eval for permanent access    HISTORY OF PRESENT ILLNESS:  this is a very pleasant 70 year old female who comes in today with her  Daughter for discussions of dialysis access.  She has a history of a left upper arm graft placed in [place].  She currently has a right femoral catheter in place.  She dialyzes on Tuesday Thursday Saturday.    the patient suffers from peripheral vascular disease.  She is undergone multiple toe amputations on the right.  She recently saw Dr. Gwenlyn Found with dry gangrene to the left great toe and fifth toe.  She is scheduled for angiography on Monday, January 16.  She has significant outflow and runoff disease by ultrasound.   The patient suffers from diabetes which has not been well controlled.  She is medically managed for hypertension.  She has a history of stroke.  She has been on Coumadin in the past but is now on Plavix.  Past Medical History  Diagnosis Date  . Diabetes mellitus without complication ([place])   . Kidney disease   . Hypertension   . Anemia   . Stroke ([place])   . Critical lower limb ischemia     Past Surgical History  Procedure Laterality Date  . Toe amputation      R foot  . Abdominal hysterectomy      Social History   Social History  . Marital Status: Single    Spouse Name: N/A  . Number of Children: N/A  . Years of Education: N/A   Occupational History  . Not on file.   Social History Main Topics  . Smoking status: Former Smoker    Quit date: 11/17/2014  . Smokeless tobacco: Never Used  . Alcohol Use: No  . Drug Use: No  . Sexual Activity: Not on file   Other Topics Concern  . Not on file   Social History Narrative    Family History  Problem Relation Age of Onset  . Asthma Mother   . Cancer Sister     Allergies as of  11/18/2015 - Review Complete 11/18/2015  Allergen Reaction Noted  . Penicillins Hives, Itching, and Rash 01/28/2014    Current Outpatient Prescriptions on File Prior to Visit  Medication Sig Dispense Refill  . amLODipine (NORVASC) 10 MG tablet Take 10 mg by mouth daily.    . carvedilol (COREG) 25 MG tablet Take 25 mg by mouth daily.    . clopidogrel (PLAVIX) 75 MG tablet Take 75 mg by mouth daily with breakfast.    . collagenase (SANTYL) ointment Apply 1 application topically daily. 15 g 0  . doxycycline (VIBRA-TABS) 100 MG tablet Take 1 tablet (100 mg total) by mouth 2 (two) times daily. 20 tablet 0  . insulin aspart (NOVOLOG) 100 UNIT/ML injection Inject 3-6 Units into the skin 3 (three) times daily before meals.     . insulin glargine (LANTUS) 100 UNIT/ML injection Inject 10 Units into the skin at bedtime.     [name] PARoxetine (PAXIL) 40 MG tablet Take 40 mg by mouth every morning.    . pravastatin (PRAVACHOL) 40 MG tablet Take 40 mg by mouth daily.    . silver sulfADIAZINE (SILVADENE) 1 % cream Apply 1 application topically daily. 50 g 0   No current facility-administered  medications on file prior to visit.     REVIEW OF SYSTEMS: Cardiovascular: No chest pain, chest pressure, palpitations, orthopnea, or dyspnea on exertion. Pulmonary: No productive cough, asthma or wheezing. Neurologic: No weakness, paresthesias, aphasia, or amaurosis. No dizziness. Hematologic: No bleeding problems or clotting disorders. Musculoskeletal: No joint pain or joint swelling. History of palpitations on the right Gastrointestinal: No blood in stool or hematemesis Genitourinary: No dysuria or hematuria. Psychiatric:: No history of major depression. Integumentary: left toe ulcer Constitutional: No fever or chills.  PHYSICAL EXAMINATION:  Filed Vitals:   11/18/15 0908 11/18/15 0910  BP: 146/76 154/77  Pulse: 75 72  Temp: 98.8 F (37.1 C)   Resp: 14   Height: 5\' 5"  (1.651 m)   Weight: 125 lb (56.7  kg)   SpO2: 100%    Body mass index is 20.8 kg/(m^2). General: The patient appears their stated age.   HEENT:  No gross abnormalities Pulmonary: Respirations are non-labored Musculoskeletal: There are no major deformities.   Neurologic: No focal weakness or paresthesias are detected, Skin:  Dry gangrene to the left great toe and lateral side of the left fifth toe. Psychiatric: The patient has normal affect. Cardiovascular: There is a regular rate and rhythm without significant murmur appreciated. Palpable right radial pulse.  Nonpalpable pedal pulses  Diagnostic Studies:  I have ordered and reviewed her vein mapping. She has an adequate right basilic vein.  Measures from 0.34 in the proximal forearm up to 0.5 in the upper arm.    Assessment:   #1: End-stage renal disease #2: atherosclerosis with lower sternum the ulcer Plan:  #1: based on the patient's vein mapping, I feel that she is a candidate for a staged basilic vein transposition. She has adequate basilic vein diameters.  I have scheduled this for Friday January 13.  She will need to be off of her Plavix for 5 days.  We discussed the risk of non-maturity, the need for a second stage procedure if the vein matures, and the risk of steal syndrome.   #2: the patient is scheduled for angiography with Dr. Gwenlyn Found to evaluate her lower extremity vascular disease.  She is at high risk for limb loss     V. Leia Alf, M.D. [facility] Office: [phone] Pager:  [phone]

## 2015-11-25 NOTE — Anesthesia Postprocedure Evaluation (Signed)
Anesthesia Post Note  Patient: Deborah Hendricks  Procedure(s) Performed: Procedure(s) (LRB): Right Arm FIRST STAGE BASILIC VEIN TRANSPOSITION (Right)  Patient location during evaluation: PACU Anesthesia Type: General Level of consciousness: awake and alert Pain management: pain level controlled Vital Signs Assessment: post-procedure vital signs reviewed and stable Respiratory status: spontaneous breathing, nonlabored ventilation, respiratory function stable and patient connected to nasal cannula oxygen Cardiovascular status: blood pressure returned to baseline and stable Postop Assessment: no signs of nausea or vomiting Anesthetic complications: no    Last Vitals:  Filed Vitals:   11/25/15 0919 11/25/15 0930  BP: 156/71 161/77  Pulse: 73 75  Temp: 36.4 C   Resp: 12 11    Last Pain:  Filed Vitals:   11/25/15 0936  PainSc: 0-No pain                 Lukas Pelcher S

## 2015-11-26 DIAGNOSIS — N186 End stage renal disease: Secondary | ICD-10-CM | POA: Diagnosis not present

## 2015-11-26 DIAGNOSIS — D631 Anemia in chronic kidney disease: Secondary | ICD-10-CM | POA: Diagnosis not present

## 2015-11-26 DIAGNOSIS — N2581 Secondary hyperparathyroidism of renal origin: Secondary | ICD-10-CM | POA: Diagnosis not present

## 2015-11-26 DIAGNOSIS — D509 Iron deficiency anemia, unspecified: Secondary | ICD-10-CM | POA: Diagnosis not present

## 2015-11-26 DIAGNOSIS — E1322 Other specified diabetes mellitus with diabetic chronic kidney disease: Secondary | ICD-10-CM | POA: Diagnosis not present

## 2015-11-28 ENCOUNTER — Encounter (HOSPITAL_COMMUNITY)
Admission: RE | Disposition: A | Discharge status: Home / Self Care | Payer: Medicare Other | Source: Ambulatory Visit | Attending: Cardiovascular Disease

## 2015-11-28 ENCOUNTER — Ambulatory Visit (HOSPITAL_COMMUNITY)
Admission: RE | Admit: 2015-11-28 | Discharge: 2015-11-28 | Disposition: A | Discharge status: Home / Self Care | Payer: Medicare Other | Source: Ambulatory Visit | Attending: Cardiovascular Disease | Admitting: Cardiovascular Disease

## 2015-11-28 ENCOUNTER — Ambulatory Visit: Payer: Medicare Other | Admitting: Podiatry

## 2015-11-28 ENCOUNTER — Encounter (HOSPITAL_COMMUNITY): Payer: Self-pay | Admitting: Surgery

## 2015-11-28 DIAGNOSIS — I998 Other disorder of circulatory system: Secondary | ICD-10-CM

## 2015-11-28 DIAGNOSIS — I120 Hypertensive chronic kidney disease with stage 5 chronic kidney disease or end stage renal disease: Secondary | ICD-10-CM | POA: Diagnosis not present

## 2015-11-28 DIAGNOSIS — E785 Hyperlipidemia, unspecified: Secondary | ICD-10-CM | POA: Insufficient documentation

## 2015-11-28 DIAGNOSIS — Z794 Long term (current) use of insulin: Secondary | ICD-10-CM | POA: Insufficient documentation

## 2015-11-28 DIAGNOSIS — I70229 Atherosclerosis of native arteries of extremities with rest pain, unspecified extremity: Secondary | ICD-10-CM | POA: Diagnosis present

## 2015-11-28 DIAGNOSIS — I70293 Other atherosclerosis of native arteries of extremities, bilateral legs: Secondary | ICD-10-CM | POA: Insufficient documentation

## 2015-11-28 DIAGNOSIS — E1142 Type 2 diabetes mellitus with diabetic polyneuropathy: Secondary | ICD-10-CM | POA: Insufficient documentation

## 2015-11-28 DIAGNOSIS — F17200 Nicotine dependence, unspecified, uncomplicated: Secondary | ICD-10-CM | POA: Diagnosis not present

## 2015-11-28 DIAGNOSIS — E119 Type 2 diabetes mellitus without complications: Secondary | ICD-10-CM | POA: Diagnosis not present

## 2015-11-28 DIAGNOSIS — I70213 Atherosclerosis of native arteries of extremities with intermittent claudication, bilateral legs: Secondary | ICD-10-CM | POA: Diagnosis not present

## 2015-11-28 DIAGNOSIS — Z8673 Personal history of transient ischemic attack (TIA), and cerebral infarction without residual deficits: Secondary | ICD-10-CM | POA: Insufficient documentation

## 2015-11-28 DIAGNOSIS — Z992 Dependence on renal dialysis: Secondary | ICD-10-CM | POA: Insufficient documentation

## 2015-11-28 DIAGNOSIS — Z880 Allergy status to penicillin: Secondary | ICD-10-CM | POA: Insufficient documentation

## 2015-11-28 DIAGNOSIS — N186 End stage renal disease: Secondary | ICD-10-CM | POA: Diagnosis not present

## 2015-11-28 HISTORY — PX: PERIPHERAL VASCULAR CATHETERIZATION: SHX172C

## 2015-11-28 LAB — GLUCOSE, CAPILLARY
GLUCOSE-CAPILLARY: 54 mg/dL — AB (ref 65–99)
GLUCOSE-CAPILLARY: 66 mg/dL (ref 65–99)
GLUCOSE-CAPILLARY: 66 mg/dL (ref 65–99)
Glucose-Capillary: 99 mg/dL (ref 65–99)
Glucose-Capillary: 71 mg/dL (ref 65–99)
Glucose-Capillary: 72 mg/dL (ref 65–99)

## 2015-11-28 SURGERY — LOWER EXTREMITY ANGIOGRAPHY

## 2015-11-28 MED ORDER — HYDRALAZINE HCL 20 MG/ML IJ SOLN: 10.0000 mg | INTRAMUSCULAR | Status: DC | PRN

## 2015-11-28 MED ORDER — LIDOCAINE HCL (PF) 1 % IJ SOLN
INTRAMUSCULAR | Status: DC | PRN
  Administered 2015-11-28 (×2): 30 mL via SUBCUTANEOUS

## 2015-11-28 MED ORDER — HYDRALAZINE HCL 20 MG/ML IJ SOLN
INTRAMUSCULAR | Status: AC
Start: 2015-11-28 — End: 2015-11-28
  Filled 2015-11-28: qty 1

## 2015-11-28 MED ORDER — MORPHINE SULFATE (PF) 2 MG/ML IV SOLN
2.0000 mg | INTRAVENOUS | Status: DC | PRN
  Administered 2015-11-28: 2 mg via INTRAVENOUS

## 2015-11-28 MED ORDER — IODIXANOL 320 MG/ML IV SOLN
INTRAVENOUS | Status: DC | PRN
  Administered 2015-11-28: 120 mL via INTRAVENOUS

## 2015-11-28 MED ORDER — FENTANYL CITRATE (PF) 100 MCG/2ML IJ SOLN
INTRAMUSCULAR | Status: DC | PRN
  Administered 2015-11-28: 25 ug via INTRAVENOUS

## 2015-11-28 MED ORDER — HEPARIN (PORCINE) IN NACL 2-0.9 UNIT/ML-% IJ SOLN
INTRAMUSCULAR | Status: AC
  Filled 2015-11-28: qty 1000

## 2015-11-28 MED ORDER — MORPHINE SULFATE (PF) 2 MG/ML IV SOLN
INTRAVENOUS | Status: AC
Start: 2015-11-28 — End: 2015-11-28
  Filled 2015-11-28: qty 1

## 2015-11-28 MED ORDER — LIDOCAINE HCL (PF) 1 % IJ SOLN
INTRAMUSCULAR | Status: DC | PRN
  Administered 2015-11-28: 45 mL

## 2015-11-28 MED ORDER — SODIUM CHLORIDE 0.9 % WEIGHT BASED INFUSION: 1.0000 mL/kg/h | INTRAVENOUS | Status: DC

## 2015-11-28 MED ORDER — LIDOCAINE HCL (PF) 1 % IJ SOLN
INTRAMUSCULAR | Status: AC
  Filled 2015-11-28: qty 60

## 2015-11-28 MED ORDER — MIDAZOLAM HCL 2 MG/2ML IJ SOLN
INTRAMUSCULAR | Status: AC
  Filled 2015-11-28: qty 2

## 2015-11-28 MED ORDER — ASPIRIN 81 MG PO CHEW: 81.0000 mg | CHEWABLE_TABLET | ORAL | Status: DC

## 2015-11-28 MED ORDER — FENTANYL CITRATE (PF) 100 MCG/2ML IJ SOLN
INTRAMUSCULAR | Status: AC
  Filled 2015-11-28: qty 2

## 2015-11-28 MED ORDER — SODIUM CHLORIDE 0.9 % IJ SOLN: 3.0000 mL | INTRAMUSCULAR | Status: DC | PRN

## 2015-11-28 MED ORDER — HYDRALAZINE HCL 20 MG/ML IJ SOLN
INTRAMUSCULAR | Status: DC | PRN
  Administered 2015-11-28: 10 mg via INTRAVENOUS

## 2015-11-28 MED ORDER — SODIUM CHLORIDE 0.9 % IV SOLN: INTRAVENOUS | Status: DC

## 2015-11-28 MED ORDER — SODIUM CHLORIDE 0.9 % WEIGHT BASED INFUSION: 3.0000 mL/kg/h | INTRAVENOUS | Status: DC

## 2015-11-28 MED ORDER — MIDAZOLAM HCL 2 MG/2ML IJ SOLN
INTRAMUSCULAR | Status: DC | PRN
  Administered 2015-11-28: 0.5 mg via INTRAVENOUS

## 2015-11-28 SURGICAL SUPPLY — 8 items
CATH ANGIO 5F PIGTAIL 65CM (CATHETERS) ×4 IMPLANT
KIT PV (KITS) ×4 IMPLANT
SHEATH PINNACLE 5F 10CM (SHEATH) ×4 IMPLANT
SYRINGE MEDRAD AVANTA MACH 7 (SYRINGE) ×4 IMPLANT
TRANSDUCER W/STOPCOCK (MISCELLANEOUS) ×4 IMPLANT
TRAY PV CATH (CUSTOM PROCEDURE TRAY) ×4 IMPLANT
TUBING CIL FLEX 10 FLL-RA (TUBING) ×4 IMPLANT
WIRE HITORQ VERSACORE ST 145CM (WIRE) ×4 IMPLANT

## 2015-11-28 NOTE — Interval H&P Note (Signed)
History and Physical Interval Note:  11/28/2015 11:36 AM  Deborah Hendricks  has presented today for surgery, with the diagnosis of claudicaiton  The various methods of treatment have been discussed with the patient and family. After consideration of risks, benefits and other options for treatment, the patient has consented to  Procedure(s): Lower Extremity Angiography (N/A) as a surgical intervention .  The patient's history has been reviewed, patient examined, no change in status, stable for surgery.  I have reviewed the patient's chart and labs.  Questions were answered to the patient's satisfaction.     Quay Burow

## 2015-11-28 NOTE — H&P (View-Only) (Signed)
11/16/2015 [name]   01-May-1946  [id number]  Primary Physician Rexene Agent, MD Primary Cardiologist: Lorretta Harp MD Renae Gloss   HPI:  Deborah Hendricks is a 70 year old divorced African-American female mother of 2, grandmother 2 grandchildren who currently lives in a nursing home. She was referred by Dr. Jacqualyn Posey, her podiatrist for evaluation of potential treatment of critical limb ischemia. Her cardiovascular risk factor profile is notable for 25 pack years of tobacco abuse having quit 4 years ago, treated hypertension, diabetes and hyperlipidemia. She has had 2 strokes the past. She's never had a heart attack and specifically denies chest pain, shortness of breath or claudication. She has chronic renal insufficiency on dialysis last 2 years with multiple failed fistulas that with a permacath in her right thigh. She has nonhealing wounds on her left first and fifth toes which are not healing. Dopplers suggest a left ABI 0.46 with occluded left popliteal and TIBIAL VESSELS.   Current Outpatient Prescriptions  Medication Sig Dispense Refill  . amLODipine (NORVASC) 10 MG tablet Take 10 mg by mouth daily.    . carvedilol (COREG) 25 MG tablet Take 25 mg by mouth daily.    . clopidogrel (PLAVIX) 75 MG tablet Take 75 mg by mouth daily with breakfast.    . collagenase (SANTYL) ointment Apply 1 application topically daily. 15 g 0  . doxycycline (VIBRA-TABS) 100 MG tablet Take 1 tablet (100 mg total) by mouth 2 (two) times daily. 20 tablet 0  . insulin aspart (NOVOLOG) 100 UNIT/ML injection Inject 3-6 Units into the skin 3 (three) times daily before meals.     . insulin glargine (LANTUS) 100 UNIT/ML injection Inject 10 Units into the skin at bedtime.     [name] PARoxetine (PAXIL) 40 MG tablet Take 40 mg by mouth every morning.    . pravastatin (PRAVACHOL) 40 MG tablet Take 40 mg by mouth daily.    . silver sulfADIAZINE (SILVADENE) 1 % cream Apply 1 application topically daily. 50 g  0   No current facility-administered medications for this visit.    Allergies  Allergen Reactions  . Penicillins Hives, Itching and Rash    Social History   Social History  . Marital Status: Single    Spouse Name: N/A  . Number of Children: N/A  . Years of Education: N/A   Occupational History  . Not on file.   Social History Main Topics  . Smoking status: Current Every Day Smoker  . Smokeless tobacco: Never Used  . Alcohol Use: No  . Drug Use: No  . Sexual Activity: Not on file   Other Topics Concern  . Not on file   Social History Narrative     Review of Systems: General: negative for chills, fever, night sweats or weight changes.  Cardiovascular: negative for chest pain, dyspnea on exertion, edema, orthopnea, palpitations, paroxysmal nocturnal dyspnea or shortness of breath Dermatological: negative for rash Respiratory: negative for cough or wheezing Urologic: negative for hematuria Abdominal: negative for nausea, vomiting, diarrhea, bright red blood per rectum, melena, or hematemesis Neurologic: negative for visual changes, syncope, or dizziness All other systems reviewed and are otherwise negative except as noted above.    Blood pressure 140/62, height 5\' 5"  (1.651 m), weight 126 lb (57.153 kg).  General appearance: alert and no distress Neck: no adenopathy, no carotid bruit, no JVD, supple, symmetrical, trachea midline and thyroid not enlarged, symmetric, no tenderness/mass/nodules Lungs: clear to auscultation bilaterally Heart: regular rate and rhythm, S1,  S2 normal, no murmur, click, rub or gallop Extremities: extremities normal, atraumatic, no cyanosis or edema and 4 toes on the right foot amputated remotely, ischemic ulcer on the left first and fifth toes  EKG sinus rhythm at 73 with septal Q waves consistent with old anteroseptal/anterolateral myocardial infarction. I personally reviewed this EKG  ASSESSMENT AND PLAN:   Critical lower limb  ischemia History sig was referred to me by Dr. Jacqualyn Posey for evaluation of potential treatment of critical limb ischemia. She is a 70 year old  Afro-American female with a history of remote tobacco abuse, treated hypertension, diabetes and hyperlipidemia. She's been on hemodialysis for the last 2 years. She has diabetic peripheral neuropathy. She has an ischemic-appearing ulcer on her left hallux and fifth toe over the last several months which are not healing. She had Dopplers performed in our office 10/31/15 revealing a right ABI 0.94 with 1 vessel runoff via the peroneal and left ABI 0.46 with an occluded popliteal and all 3 tibial vessels. She is on hemodialysis. I suspect she will not heal her wounds without adequate circulation. I also suspect that the likelihood be able to percutaneously revascularize her and establish Flow to her foot is fairly small. In the absence of this, she will most likely need a left BKA. Given this, I'm going to arrange for undergo angiography. Right femoral approach. I do realize she has a permacath in her right thigh and is scheduled to see the VSS this week for consideration of a fistula.      Lorretta Harp MD FACP,FACC,FAHA, [facility] 11/16/2015 1:58 PM

## 2015-11-28 NOTE — Progress Notes (Signed)
Paged by nursing staff for numbness in her right hands, upon further interview with the patient, she has been having the same symptom for the past 3 yrs. She recently underwent R AVF vein transposition last Friday on 11/25/2015, she underwent LE angiography today by Dr. Gwenlyn Found, unfortunately, her LE arterial was too extensive for stenting. Likely will require amputation in the future. She also has DM.   Physical exam showed 1+ radial artery pulse on R side, bilateral hand cold. DD include decreased arterial perfusion to bilateral UE vs DM. Given the patient that her symptom has started many years before her RUE AVF procedure, this likely is chronic. Will hold on acute workup. I asked the patient, she says her symptom is not worse compare to prior to AVF vein transposition.  Hilbert Corrigan PA Pager: [phone]

## 2015-11-28 NOTE — Progress Notes (Signed)
Called to pt's room for c/o numbness and tingling to right hand.  S/P fistulogram 11/25/15.  Fingers cool to touch and could not palpate a radial pulse.  Was able to doppler right radial pulse.  Dr Gwenlyn Found notified and requested PA to see

## 2015-11-28 NOTE — Discharge Instructions (Signed)

## 2015-11-28 NOTE — Progress Notes (Signed)
Site area: Insurance underwriter Prior to Removal:  Level 0 Pressure Applied For:25 min Manual:   manualhold Patient Status During Pull:  A/o Post Pull Site:  Level0 Post Pull Instructions Given:  Pt understands post sheath pull instructions Post Pull Pulses Present: Doppler rt pt/lt pt Dressing Applied:  4x4 and a large tegaderm Bedrest begins @ 13:20:00 Comments: Pt leaves ha in stable condition. Lt groin unremarkable. Dressing to lt groin is CDI.

## 2015-11-28 NOTE — Progress Notes (Signed)
Dr Gwenlyn Found in to speak with pt and pts daughter. Short stay updated about pt condition. Updated Ebony Hail -RN in short stay. Pt leaves ha in stable condition. Lt groin site unremarkable.

## 2015-11-29 ENCOUNTER — Encounter (HOSPITAL_COMMUNITY): Payer: Self-pay | Admitting: Cardiovascular Disease

## 2015-11-29 ENCOUNTER — Emergency Department (HOSPITAL_COMMUNITY)
Admission: EM | Admit: 2015-11-29 | Discharge: 2015-11-29 | Disposition: A | Discharge status: Home / Self Care | Payer: Medicare Other | Attending: Emergency Medicine | Admitting: Emergency Medicine

## 2015-11-29 DIAGNOSIS — Z91040 Latex allergy status: Secondary | ICD-10-CM | POA: Diagnosis not present

## 2015-11-29 DIAGNOSIS — F419 Anxiety disorder, unspecified: Secondary | ICD-10-CM | POA: Diagnosis not present

## 2015-11-29 DIAGNOSIS — K5900 Constipation, unspecified: Secondary | ICD-10-CM | POA: Diagnosis not present

## 2015-11-29 DIAGNOSIS — Z87891 Personal history of nicotine dependence: Secondary | ICD-10-CM | POA: Diagnosis not present

## 2015-11-29 DIAGNOSIS — Z9889 Other specified postprocedural states: Secondary | ICD-10-CM | POA: Diagnosis not present

## 2015-11-29 DIAGNOSIS — Z7982 Long term (current) use of aspirin: Secondary | ICD-10-CM | POA: Diagnosis not present

## 2015-11-29 DIAGNOSIS — Z862 Personal history of diseases of the blood and blood-forming organs and certain disorders involving the immune mechanism: Secondary | ICD-10-CM | POA: Diagnosis not present

## 2015-11-29 DIAGNOSIS — Z792 Long term (current) use of antibiotics: Secondary | ICD-10-CM | POA: Diagnosis not present

## 2015-11-29 DIAGNOSIS — E119 Type 2 diabetes mellitus without complications: Secondary | ICD-10-CM | POA: Diagnosis not present

## 2015-11-29 DIAGNOSIS — Z8673 Personal history of transient ischemic attack (TIA), and cerebral infarction without residual deficits: Secondary | ICD-10-CM | POA: Diagnosis not present

## 2015-11-29 DIAGNOSIS — Z79899 Other long term (current) drug therapy: Secondary | ICD-10-CM | POA: Diagnosis not present

## 2015-11-29 DIAGNOSIS — N2581 Secondary hyperparathyroidism of renal origin: Secondary | ICD-10-CM | POA: Diagnosis not present

## 2015-11-29 DIAGNOSIS — Z794 Long term (current) use of insulin: Secondary | ICD-10-CM | POA: Diagnosis not present

## 2015-11-29 DIAGNOSIS — Z87448 Personal history of other diseases of urinary system: Secondary | ICD-10-CM | POA: Insufficient documentation

## 2015-11-29 DIAGNOSIS — N186 End stage renal disease: Secondary | ICD-10-CM | POA: Insufficient documentation

## 2015-11-29 DIAGNOSIS — I509 Heart failure, unspecified: Secondary | ICD-10-CM | POA: Diagnosis not present

## 2015-11-29 DIAGNOSIS — D631 Anemia in chronic kidney disease: Secondary | ICD-10-CM | POA: Diagnosis not present

## 2015-11-29 DIAGNOSIS — I120 Hypertensive chronic kidney disease with stage 5 chronic kidney disease or end stage renal disease: Secondary | ICD-10-CM | POA: Diagnosis not present

## 2015-11-29 DIAGNOSIS — Z880 Allergy status to penicillin: Secondary | ICD-10-CM | POA: Insufficient documentation

## 2015-11-29 DIAGNOSIS — R55 Syncope and collapse: Secondary | ICD-10-CM | POA: Diagnosis not present

## 2015-11-29 DIAGNOSIS — D509 Iron deficiency anemia, unspecified: Secondary | ICD-10-CM | POA: Diagnosis not present

## 2015-11-29 DIAGNOSIS — Z992 Dependence on renal dialysis: Secondary | ICD-10-CM | POA: Insufficient documentation

## 2015-11-29 DIAGNOSIS — R404 Transient alteration of awareness: Secondary | ICD-10-CM | POA: Diagnosis not present

## 2015-11-29 DIAGNOSIS — E1322 Other specified diabetes mellitus with diabetic chronic kidney disease: Secondary | ICD-10-CM | POA: Diagnosis not present

## 2015-11-29 LAB — CBC
HCT: 40.1 % (ref 36.0–46.0)
Hemoglobin: 12.5 g/dL (ref 12.0–15.0)
MCH: 30.4 pg (ref 26.0–34.0)
MCHC: 31.2 g/dL (ref 30.0–36.0)
MCV: 97.6 fL (ref 78.0–100.0)
PLATELETS: 216 10*3/uL (ref 150–400)
RBC: 4.11 MIL/uL (ref 3.87–5.11)
RDW: 17.2 % — ABNORMAL HIGH (ref 11.5–15.5)
WBC: 6.9 10*3/uL (ref 4.0–10.5)

## 2015-11-29 LAB — BASIC METABOLIC PANEL
Anion gap: 14 (ref 5–15)
BUN: 45 mg/dL — ABNORMAL HIGH (ref 6–20)
CALCIUM: 9.1 mg/dL (ref 8.9–10.3)
CO2: 26 mmol/L (ref 22–32)
CREATININE: 6.65 mg/dL — AB (ref 0.44–1.00)
Chloride: 98 mmol/L — ABNORMAL LOW (ref 101–111)
GFR calc Af Amer: 7 mL/min — ABNORMAL LOW (ref >60)
GFR, EST NON AFRICAN AMERICAN: 6 mL/min — AB (ref >60)
Glucose, Bld: 109 mg/dL — ABNORMAL HIGH (ref 65–99)
POTASSIUM: 4.8 mmol/L (ref 3.5–5.1)
Sodium: 138 mmol/L (ref 135–145)

## 2015-11-29 LAB — CBG MONITORING, ED: GLUCOSE-CAPILLARY: 90 mg/dL (ref 65–99)

## 2015-11-29 NOTE — ED Notes (Addendum)
Pt to ED via [facility] from dialysis center after having a syncopal episode. Pt alert and oriented x 4, pt had angiogram yesterday in left groin -- bandage intact, left foot swollen with left great toe and left pinky toe black.  Pt's daughter was face timing patient and witnessed pt's eyes roll back in her head and beginning to pass out-- pt's daughter called center to make them aware of situation.

## 2015-11-29 NOTE — ED Notes (Signed)
Ordered Meal Tray (Diabetic) @ 1524

## 2015-11-29 NOTE — ED Notes (Signed)
Pt feels that too much fluid was being taken off too fast and made her feel faint.

## 2015-11-29 NOTE — ED Provider Notes (Addendum)
CSN: [id number]     Arrival date & time 11/29/15  1328 History   First MD Initiated Contact with Patient 11/29/15 1436   Chief Complaint  Patient presents with  . Loss of Consciousness     (Consider location/radiation/quality/duration/timing/severity/associated sxs/prior Treatment) HPI Deborah Hendricks is a 70 y.o. female who presents for evaluation of an episode of syncope, while being on dialysis, today. At that time, she was communicating with her daughter, by face time, who noted that the patient gradually stopped talking and became unresponsive. At that time, the daughter called the dialysis center to talk to the nurse and let them know was going on. Apparently the patient was then found with altered mental status, and possibly syncope, which lasted for, at the most, 45 seconds. When the patient awoke she returned to her baseline status, clinically. The patient's daughter feels like the patient is getting her fluid pulled off too fast, which has occurred multiple times since she began dialysis at her new center. She recently moved to [facility] and has been dialyzing at the new center for about 2 months. Patient denies any recent illnesses, including fever, chills, cough, shortness of breath, nausea, vomiting, other episodes of weakness or dizziness. There are no other known modifying factors.  Past Medical History  Diagnosis Date  . Diabetes mellitus without complication ([place])   . Hypertension   . Anemia   . Critical lower limb ischemia   . Stroke ([place]) 04/2015    unable to stand .  can have diffuuclty with speech at times  . Seizures ([place]) 04/2015    since   . Kidney disease     ESRD  . Depression   . Anxiety   . Constipation   . Memory loss     since stroke 04/2015  . Peripheral vascular disease ([place])   . CHF (congestive heart failure) [facility])    Past Surgical History  Procedure Laterality Date  . Toe amputation      R foot  . Abdominal hysterectomy    . Tonsillectomy    .  Bascilic vein transposition Right 11/25/2015    Procedure: Right Arm FIRST STAGE BASILIC VEIN TRANSPOSITION;  Surgeon: Serafina Mitchell, MD;  Location: [place];  Service: Vascular;  Laterality: Right;  . Peripheral vascular catheterization Bilateral 11/28/2015    Procedure: Lower Extremity Angiography;  Surgeon: Lorretta Harp, MD;  Location: [place] [facility];  Service: Cardiovascular;  Laterality: Bilateral;  . Peripheral vascular catheterization N/A 11/28/2015    Procedure: Abdominal Aortogram;  Surgeon: Lorretta Harp, MD;  Location: [place] [facility];  Service: Cardiovascular;  Laterality: N/A;   Family History  Problem Relation Age of Onset  . Asthma Mother   . Cancer Sister    Social History  Substance Use Topics  . Smoking status: Former Smoker    Quit date: 11/17/2013  . Smokeless tobacco: Never Used  . Alcohol Use: No   OB History    No data available     Review of Systems  All other systems reviewed and are negative.     Allergies  Amoxicillin; Latex; Sulfa antibiotics; and Penicillins  Home Medications   Prior to Admission medications   Medication Sig Start Date End Date Taking? Authorizing Provider  aspirin EC 325 MG tablet Take 325 mg by mouth daily.   Yes Historical Provider, MD  carvedilol (COREG) 12.5 MG tablet Take 12.5 mg by mouth every Tuesday, Thursday, and Saturday at 6 PM.   Yes Historical  Provider, MD  carvedilol (COREG) 12.5 MG tablet Take 12.5 mg by mouth See admin instructions. Take 12.5 mg every sun, mon, wed, and fri twice daily   Yes Historical Provider, MD  esomeprazole (NEXIUM) 20 MG capsule Take 20 mg by mouth daily at 12 noon.   Yes Historical Provider, MD  insulin aspart (NOVOLOG) 100 UNIT/ML injection Inject 2-10 Units into the skin 3 (three) times daily before meals. 151-200 = 2 units, 201-250 = 3 units, 251-300 = 4 unit, 301-350 = 6 units, 351-400 = 8 units, 401-999 = 10 units   Yes Historical Provider, MD  insulin detemir (LEVEMIR) 100  UNIT/ML injection Inject 18 Units into the skin daily.   Yes Historical Provider, MD  lactulose (CHRONULAC) 10 GM/15ML solution Take 20 g by mouth 2 (two) times daily as needed for mild constipation.   Yes Historical Provider, MD  LORazepam (ATIVAN) 0.5 MG tablet Take 0.5 mg by mouth at bedtime.   Yes Historical Provider, MD  magnesium oxide (MAG-OX) 400 MG tablet Take 400 mg by mouth daily.   Yes Historical Provider, MD  mirtazapine (REMERON) 15 MG tablet Take 15 mg by mouth at bedtime.   Yes Historical Provider, MD  Nutritional Supplements (FEEDING SUPPLEMENT, NEPRO CARB STEADY,) LIQD Take 237 mLs by mouth 2 (two) times daily between meals.   Yes Historical Provider, MD  oxyCODONE-acetaminophen (PERCOCET/ROXICET) 5-325 MG tablet Take 1-2 tablets by mouth every 4 (four) hours as needed for moderate pain or severe pain. 11/25/15  Yes Ulyses Amor, PA-C  collagenase (SANTYL) ointment Apply 1 application topically daily. Patient not taking: Reported on 11/24/2015 10/28/15   Trula Slade, DPM  doxycycline (VIBRA-TABS) 100 MG tablet Take 1 tablet (100 mg total) by mouth 2 (two) times daily. Patient not taking: Reported on 11/24/2015 11/11/15   Trula Slade, DPM  glucagon 1 MG injection Inject 1 mg into the vein once as needed (hypoglycemia).    Historical Provider, MD  ondansetron (ZOFRAN) 4 MG tablet Take 4 mg by mouth every 8 (eight) hours as needed for nausea or vomiting.    Historical Provider, MD  silver sulfADIAZINE (SILVADENE) 1 % cream Apply 1 application topically daily. Patient not taking: Reported on 11/29/2015 10/28/15   Trula Slade, DPM   BP 178/85 mmHg  Pulse 88  Temp(Src) 98.3 F (36.8 C) (Oral)  Resp 15  Ht 5' (1.524 m)  Wt 145 lb (65.772 kg)  BMI 28.32 kg/m2  SpO2 94% Physical Exam  Constitutional: She is oriented to person, place, and time. She appears well-developed and well-nourished.  HENT:  Head: Normocephalic and atraumatic.  Right Ear: External ear  normal.  Left Ear: External ear normal.  Eyes: Conjunctivae and EOM are normal. Pupils are equal, round, and reactive to light.  Neck: Normal range of motion and phonation normal. Neck supple.  Cardiovascular: Normal rate, regular rhythm and normal heart sounds.   Pulmonary/Chest: Effort normal and breath sounds normal. She exhibits no bony tenderness.  Abdominal: Soft. There is no tenderness.  Musculoskeletal: Normal range of motion.  Neurological: She is alert and oriented to person, place, and time. No cranial nerve deficit or sensory deficit. She exhibits normal muscle tone. Coordination normal.  Skin: Skin is warm, dry and intact.  Wound on right upper arm, from recent fistula surgery, appears to be healing normally.  Psychiatric: She has a normal mood and affect. Her behavior is normal. Judgment and thought content normal.  Nursing note and vitals reviewed.  ED Course  Procedures (including critical care time)  Medications - No data to display  Patient Vitals for the past 24 hrs:  BP Temp Temp src Pulse Resp SpO2 Height Weight  11/29/15 1559 178/85 mmHg - - 88 15 94 % - -  11/29/15 1346 179/90 mmHg 98.3 F (36.8 C) Oral 87 14 100 % 5' (1.524 m) 145 lb (65.772 kg)  11/29/15 1330 - - - - - 100 % - -    4:25 PM Reevaluation with update and discussion. After initial assessment and treatment, an updated evaluation reveals no change in clinical status. Patient's daughter is concerned that she is not getting right treatment at her dialysis center. I discussed my conversation with Dr. Joelyn Oms with her. She stated that she would try to contact Dr. Joelyn Oms to discuss things. Findings discussed with the patient and family member, all questions answered. Valentino Saavedra L   16:10- case discussed with nephrology, Dr. Joelyn Oms. I informed him of the patient and family members concern. He states that he felt comfortable with the patient going home today, but following up tomorrow for a repeat  dialysis treatment. Family is to call for an appointment in the morning.  Labs Review Labs Reviewed  BASIC METABOLIC PANEL - Abnormal; Notable for the following:    Chloride 98 (*)    Glucose, Bld 109 (*)    BUN 45 (*)    Creatinine, Ser 6.65 (*)    GFR calc non Af Amer 6 (*)    GFR calc Af Amer 7 (*)    All other components within normal limits  CBC - Abnormal; Notable for the following:    RDW 17.2 (*)    All other components within normal limits  URINALYSIS, ROUTINE W REFLEX MICROSCOPIC (NOT AT [facility])  CBG MONITORING, ED    Imaging Review No results found. I have personally reviewed and evaluated these images and lab results as part of my medical decision-making.   EKG Interpretation   Date/Time:  Tuesday November 29 2015 13:29:37 EST Ventricular Rate:  87 PR Interval:  240 QRS Duration: 88 QT Interval:  409 QTC Calculation: 492 R Axis:   15 Text Interpretation:  Sinus rhythm Prolonged PR interval Anterior infarct,  old since last tracing no significant change Confirmed by Eulis Foster  MD,  Seeley Hissong [phone]) on 11/29/2015 5:02:25 PM      MDM   Final diagnoses:  Syncope, unspecified syncope type    Syncope while being dialyzed. This is likely related to fluid volume shifts, and not a acute cardiac or neurologic event. Screen evaluation, ED indicate stability.  Nursing Notes Reviewed/ Care Coordinated Applicable Imaging Reviewed Interpretation of Laboratory Data incorporated into ED treatment  The patient appears reasonably screened and/or stabilized for discharge and I doubt any other medical condition or other [facility] requiring further screening, evaluation, or treatment in the ED at this time prior to discharge.  Plan: Home Medications- usual; Home Treatments- rest; return here if the recommended treatment, does not improve the symptoms; Recommended follow up- Dialysis tomorrow. PCP prn   Daleen Bo, MD 11/29/15 [place], MD 11/29/15 [phone]

## 2015-11-29 NOTE — Discharge Instructions (Signed)
Call the dialysis center in the morning to arrange for a dialysis treatment tomorrow. Return here, if needed, for problems.   Syncope Syncope is a medical term for fainting or passing out. This means you lose consciousness and drop to the ground. People are generally unconscious for less than 5 minutes. You may have some muscle twitches for up to 15 seconds before waking up and returning to normal. Syncope occurs more often in older adults, but it can happen to anyone. While most causes of syncope are not dangerous, syncope can be a sign of a serious medical problem. It is important to seek medical care.  CAUSES  Syncope is caused by a sudden drop in blood flow to the brain. The specific cause is often not determined. Factors that can bring on syncope include:  Taking medicines that lower blood pressure.  Sudden changes in posture, such as standing up quickly.  Taking more medicine than prescribed.  Standing in one place for too long.  Seizure disorders.  Dehydration and excessive exposure to heat.  Low blood sugar (hypoglycemia).  Straining to have a bowel movement.  Heart disease, irregular heartbeat, or other circulatory problems.  Fear, emotional distress, seeing blood, or severe pain. SYMPTOMS  Right before fainting, you may:  Feel dizzy or light-headed.  Feel nauseous.  See all white or all black in your field of vision.  Have cold, clammy skin. DIAGNOSIS  Your health care provider will ask about your symptoms, perform a physical exam, and perform an electrocardiogram (ECG) to record the electrical activity of your heart. Your health care provider may also perform other heart or blood tests to determine the cause of your syncope which may include:  Transthoracic echocardiogram (TTE). During echocardiography, sound waves are used to evaluate how blood flows through your heart.  Transesophageal echocardiogram (TEE).  Cardiac monitoring. This allows your health care  provider to monitor your heart rate and rhythm in real time.  Holter monitor. This is a portable device that records your heartbeat and can help diagnose heart arrhythmias. It allows your health care provider to track your heart activity for several days, if needed.  Stress tests by exercise or by giving medicine that makes the heart beat faster. TREATMENT  In most cases, no treatment is needed. Depending on the cause of your syncope, your health care provider may recommend changing or stopping some of your medicines. HOME CARE INSTRUCTIONS  Have someone stay with you until you feel stable.  Do not drive, use machinery, or play sports until your health care provider says it is okay.  Keep all follow-up appointments as directed by your health care provider.  Lie down right away if you start feeling like you might faint. Breathe deeply and steadily. Wait until all the symptoms have passed.  Drink enough fluids to keep your urine clear or pale yellow.  If you are taking blood pressure or heart medicine, get up slowly and take several minutes to sit and then stand. This can reduce dizziness. SEEK IMMEDIATE MEDICAL CARE IF:   You have a severe headache.  You have unusual pain in the chest, abdomen, or back.  You are bleeding from your mouth or rectum, or you have black or tarry stool.  You have an irregular or very fast heartbeat.  You have pain with breathing.  You have repeated fainting or seizure-like jerking during an episode.  You faint when sitting or lying down.  You have confusion.  You have trouble walking.  You  have severe weakness.  You have vision problems. If you fainted, call your local emergency services (911 in [place] Do not drive yourself to the hospital.    This information is not intended to replace advice given to you by your health care provider. Make sure you discuss any questions you have with your health care provider.   Document Released: 10/29/2005  Document Revised: 03/15/2015 Document Reviewed: 12/28/2011 Elsevier Interactive Patient Education [employer].

## 2015-11-29 NOTE — ED Notes (Signed)
Meal delivered

## 2015-12-01 DIAGNOSIS — N186 End stage renal disease: Secondary | ICD-10-CM | POA: Diagnosis not present

## 2015-12-01 DIAGNOSIS — D631 Anemia in chronic kidney disease: Secondary | ICD-10-CM | POA: Diagnosis not present

## 2015-12-01 DIAGNOSIS — N2581 Secondary hyperparathyroidism of renal origin: Secondary | ICD-10-CM | POA: Diagnosis not present

## 2015-12-01 DIAGNOSIS — D509 Iron deficiency anemia, unspecified: Secondary | ICD-10-CM | POA: Diagnosis not present

## 2015-12-01 DIAGNOSIS — E1322 Other specified diabetes mellitus with diabetic chronic kidney disease: Secondary | ICD-10-CM | POA: Diagnosis not present

## 2015-12-03 DIAGNOSIS — D509 Iron deficiency anemia, unspecified: Secondary | ICD-10-CM | POA: Diagnosis not present

## 2015-12-03 DIAGNOSIS — E1322 Other specified diabetes mellitus with diabetic chronic kidney disease: Secondary | ICD-10-CM | POA: Diagnosis not present

## 2015-12-03 DIAGNOSIS — D631 Anemia in chronic kidney disease: Secondary | ICD-10-CM | POA: Diagnosis not present

## 2015-12-03 DIAGNOSIS — N186 End stage renal disease: Secondary | ICD-10-CM | POA: Diagnosis not present

## 2015-12-03 DIAGNOSIS — N2581 Secondary hyperparathyroidism of renal origin: Secondary | ICD-10-CM | POA: Diagnosis not present

## 2015-12-05 ENCOUNTER — Ambulatory Visit (INDEPENDENT_AMBULATORY_CARE_PROVIDER_SITE_OTHER): Payer: Medicare Other | Admitting: Podiatry

## 2015-12-05 ENCOUNTER — Encounter: Payer: Self-pay | Admitting: Surgery

## 2015-12-05 ENCOUNTER — Ambulatory Visit (INDEPENDENT_AMBULATORY_CARE_PROVIDER_SITE_OTHER): Payer: Medicare Other | Admitting: Surgery

## 2015-12-05 ENCOUNTER — Encounter: Payer: Self-pay | Admitting: Podiatry

## 2015-12-05 VITALS — BP 122/43 | HR 41 | Temp 97.4°F | Ht 60.0 in | Wt 145.0 lb

## 2015-12-05 DIAGNOSIS — I7025 Atherosclerosis of native arteries of other extremities with ulceration: Secondary | ICD-10-CM | POA: Diagnosis not present

## 2015-12-05 DIAGNOSIS — L97529 Non-pressure chronic ulcer of other part of left foot with unspecified severity: Secondary | ICD-10-CM | POA: Diagnosis not present

## 2015-12-05 DIAGNOSIS — I998 Other disorder of circulatory system: Secondary | ICD-10-CM

## 2015-12-05 DIAGNOSIS — E11621 Type 2 diabetes mellitus with foot ulcer: Secondary | ICD-10-CM | POA: Diagnosis not present

## 2015-12-05 NOTE — Progress Notes (Signed)
Patient name: Deborah Hendricks MRN: [id number] DOB: May 25, 1946 Sex: female     Chief Complaint  Patient presents with  . Re-evaluation    eval LE - add on     HISTORY OF PRESENT ILLNESS:  the patient is here today for evaluation of a left foot ulcer.  She is well known to me having undergone first stage right basilic vein transposition approximately 2 weeks ago.  She is a hemodialysis patient on Tuesday Thursday Saturday. She has had a wound on her left first and fifth toe for a few months. She underwent angiography by Dr. Gwenlyn Found and does not have any options for revascularization.  Her wounds are getting worse and she was sent over for discussions of amputation. The patient has not walked since last summer  The patient suffers from diabetes which has not been well controlled. She is medically managed for hypertension. She has a history of stroke. She has been on Coumadin in the past but is now on Plavix.  Past Medical History  Diagnosis Date  . Diabetes mellitus without complication ([place])   . Hypertension   . Anemia   . Critical lower limb ischemia   . Stroke ([place]) 04/2015    unable to stand .  can have diffuuclty with speech at times  . Seizures ([place]) 04/2015    since   . Kidney disease     ESRD  . Depression   . Anxiety   . Constipation   . Memory loss     since stroke 04/2015  . Peripheral vascular disease ([place])   . CHF (congestive heart failure) [facility])     Past Surgical History  Procedure Laterality Date  . Toe amputation      R foot  . Abdominal hysterectomy    . Tonsillectomy    . Bascilic vein transposition Right 11/25/2015    Procedure: Right Arm FIRST STAGE BASILIC VEIN TRANSPOSITION;  Surgeon: Serafina Mitchell, MD;  Location: [place];  Service: Vascular;  Laterality: Right;  . Peripheral vascular catheterization Bilateral 11/28/2015    Procedure: Lower Extremity Angiography;  Surgeon: Lorretta Harp, MD;  Location: [place] [facility];  Service: Cardiovascular;   Laterality: Bilateral;  . Peripheral vascular catheterization N/A 11/28/2015    Procedure: Abdominal Aortogram;  Surgeon: Lorretta Harp, MD;  Location: [place] [facility];  Service: Cardiovascular;  Laterality: N/A;    Social History   Social History  . Marital Status: Single    Spouse Name: N/A  . Number of Children: N/A  . Years of Education: N/A   Occupational History  . Not on file.   Social History Main Topics  . Smoking status: Former Smoker    Quit date: 11/17/2013  . Smokeless tobacco: Never Used  . Alcohol Use: No  . Drug Use: No  . Sexual Activity: Not on file   Other Topics Concern  . Not on file   Social History Narrative    Family History  Problem Relation Age of Onset  . Asthma Mother   . Cancer Sister     Allergies as of 12/05/2015 - Review Complete 12/05/2015  Allergen Reaction Noted  . Amoxicillin  11/24/2015  . Latex  11/24/2015  . Sulfa antibiotics  11/24/2015  . Penicillins Hives, Itching, and Rash 01/28/2014    Current Outpatient Prescriptions on File Prior to Visit  Medication Sig Dispense Refill  . aspirin EC 325 MG tablet Take 325 mg by mouth daily.    [name]  carvedilol (COREG) 12.5 MG tablet Take 12.5 mg by mouth every Tuesday, Thursday, and Saturday at 6 PM.    . carvedilol (COREG) 12.5 MG tablet Take 12.5 mg by mouth See admin instructions. Take 12.5 mg every sun, mon, wed, and fri twice daily    . esomeprazole (NEXIUM) 20 MG capsule Take 20 mg by mouth daily at 12 noon.    [name] glucagon 1 MG injection Inject 1 mg into the vein once as needed (hypoglycemia).    . insulin aspart (NOVOLOG) 100 UNIT/ML injection Inject 2-10 Units into the skin 3 (three) times daily before meals. 151-200 = 2 units, 201-250 = 3 units, 251-300 = 4 unit, 301-350 = 6 units, 351-400 = 8 units, 401-999 = 10 units    . insulin detemir (LEVEMIR) 100 UNIT/ML injection Inject 18 Units into the skin daily.    [name] lactulose (CHRONULAC) 10 GM/15ML solution Take 20 g by mouth 2  (two) times daily as needed for mild constipation.    [name] LORazepam (ATIVAN) 0.5 MG tablet Take 0.5 mg by mouth at bedtime.    . magnesium oxide (MAG-OX) 400 MG tablet Take 400 mg by mouth daily.    . mirtazapine (REMERON) 15 MG tablet Take 15 mg by mouth at bedtime.    . Nutritional Supplements (FEEDING SUPPLEMENT, NEPRO CARB STEADY,) LIQD Take 237 mLs by mouth 2 (two) times daily between meals.    . ondansetron (ZOFRAN) 4 MG tablet Take 4 mg by mouth every 8 (eight) hours as needed for nausea or vomiting.    [name] oxyCODONE-acetaminophen (PERCOCET/ROXICET) 5-325 MG tablet Take 1-2 tablets by mouth every 4 (four) hours as needed for moderate pain or severe pain. 30 tablet 0  . collagenase (SANTYL) ointment Apply 1 application topically daily. (Patient not taking: Reported on 11/24/2015) 15 g 0  . doxycycline (VIBRA-TABS) 100 MG tablet Take 1 tablet (100 mg total) by mouth 2 (two) times daily. (Patient not taking: Reported on 11/24/2015) 20 tablet 0  . silver sulfADIAZINE (SILVADENE) 1 % cream Apply 1 application topically daily. (Patient not taking: Reported on 11/29/2015) 50 g 0   No current facility-administered medications on file prior to visit.     REVIEW OF SYSTEMS: Cardiovascular: No chest pain,  Pulmonary: No productive cough, asthma or wheezing. Hematologic: No bleeding problems or clotting disorders. Musculoskeletal:  Non-ambulatory Gastrointestinal: No blood in stool or hematemesis Genitourinary: No dysuria or hematuria. Psychiatric:: No history of major depression. Integumentary:  Left foot ulcer Constitutional: No fever or chills.  PHYSICAL EXAMINATION:   Vital signs are  Filed Vitals:   12/05/15 1602  BP: 122/43  Pulse: 41  Temp: 97.4 F (36.3 C)  TempSrc: Oral  Height: 5' (1.524 m)  Weight: 145 lb (65.772 kg)  SpO2: 99%   Body mass index is 28.32 kg/(m^2). General: The patient appears their stated age. HEENT:  No gross abnormalities Pulmonary:  Non labored  breathing Musculoskeletal: There are no major deformities. Neurologic: No focal weakness or paresthesias are detected, Skin: There are no ulcer or rashes noted. Psychiatric: The patient has normal affect. Cardiovascular:  A thrill within basilic vein transposition   Diagnostic Studies  I have reviewed the arteriogram by Dr. Gwenlyn Found which shows no options for revascularization and occlusion of the popliteal artery  Assessment:  nonhealing wound, left foot Plan:  I discussed options with the patient and her daughter. We discussed levels of amputation : TMA, BKA, versus AKA.  I think she has a high risk of non-healing if  we do anything on the foot. There is  Also a high likelihood that she would not heal a below-knee amputation given that she has not walked in almost a year I don't think there is a significant advantage to try and save the knee.  Therefore we have agreed to proceed with an above-knee amputation.   I also discussed with the patient that this is not something that needs to be done immediately as she is not having any pain and does not have any infection.  The patient states that she wants to just get this over with.  Therefore have scheduled her operation for Thursday February second.  She will be off her Plavix for 5 days.  Eldridge Abrahams, M.D. [facility] Office: [phone] Pager:  [phone]

## 2015-12-05 NOTE — Progress Notes (Signed)
Patient ID: Deborah Hendricks, female   DOB: 1946/05/16, 70 y.o.   MRN: [id number]  Subjective: 70 year old female presents the office today with her daughter for follow-up evaluation of wounds to both of her big and fifth toe on the left foot.  The wounds to continue these seem to be progressing. Since last clinic she did have an angiogram performed by Dr. Gwenlyn Found and given the outcome he thought that she would most likely benefit from an above-knee amputation. She presents today to discuss her options. She denies systemic complaints such as fevers, chills, nausea, vomiting. No calf pain, chest pain, shortness of breath.  Objective: AAO 3, NAD DP/PT pulses decrease, CRT somewhat delayed Protective sensation decreased with Simms Weinstein monofilament, vibratory sensation decreased. Previous amputations of the right second through fifth digits. There is no open lesions identified on the right foot. There is no pre-ulcerative lesions on the right foot. On the left foot on the dorsal aspect of the hallux and to the lateral fifth toe there is a discharge present which is increased in size there is macerated tissue around the area. There is no erythema or increased warmth. Mild edema to the foot. No other open lesions identified. There is hyperpigmentation of the skin. No areas of tenderness bilateral lower extremities.  There is no edema, erythema, increased warmth. calf compression, swelling, warmth, erythema.  Assessment: 70 year old female left first and fifth dorsal ulcerations/eschar, PVD  Plan: -Treatment options discussed including all alternatives, risks, and complications -Etiology of symptoms were discussed with the patients daughter.  -At this time there is nothing further that I can offer her. An above-knee amputation was recommended. She will see vascular surgery later on today. The patient doesn't do for her in the future encouraged to call the office. -Monitor for any clinical signs or  symptoms of infection and directed to call the office immediately should any occur or go to the ER.  Celesta Gentile, DPM

## 2015-12-06 DIAGNOSIS — N186 End stage renal disease: Secondary | ICD-10-CM | POA: Diagnosis not present

## 2015-12-06 DIAGNOSIS — E1322 Other specified diabetes mellitus with diabetic chronic kidney disease: Secondary | ICD-10-CM | POA: Diagnosis not present

## 2015-12-06 DIAGNOSIS — D509 Iron deficiency anemia, unspecified: Secondary | ICD-10-CM | POA: Diagnosis not present

## 2015-12-06 DIAGNOSIS — F329 Major depressive disorder, single episode, unspecified: Secondary | ICD-10-CM | POA: Diagnosis not present

## 2015-12-06 DIAGNOSIS — F419 Anxiety disorder, unspecified: Secondary | ICD-10-CM | POA: Diagnosis not present

## 2015-12-06 DIAGNOSIS — N2581 Secondary hyperparathyroidism of renal origin: Secondary | ICD-10-CM | POA: Diagnosis not present

## 2015-12-06 DIAGNOSIS — D631 Anemia in chronic kidney disease: Secondary | ICD-10-CM | POA: Diagnosis not present

## 2015-12-06 DIAGNOSIS — F0390 Unspecified dementia without behavioral disturbance: Secondary | ICD-10-CM | POA: Diagnosis not present

## 2015-12-06 DIAGNOSIS — F603 Borderline personality disorder: Secondary | ICD-10-CM | POA: Diagnosis not present

## 2015-12-08 DIAGNOSIS — N2581 Secondary hyperparathyroidism of renal origin: Secondary | ICD-10-CM | POA: Diagnosis not present

## 2015-12-08 DIAGNOSIS — N186 End stage renal disease: Secondary | ICD-10-CM | POA: Diagnosis not present

## 2015-12-08 DIAGNOSIS — D509 Iron deficiency anemia, unspecified: Secondary | ICD-10-CM | POA: Diagnosis not present

## 2015-12-08 DIAGNOSIS — E1322 Other specified diabetes mellitus with diabetic chronic kidney disease: Secondary | ICD-10-CM | POA: Diagnosis not present

## 2015-12-08 DIAGNOSIS — D631 Anemia in chronic kidney disease: Secondary | ICD-10-CM | POA: Diagnosis not present

## 2015-12-09 ENCOUNTER — Other Ambulatory Visit: Payer: Self-pay

## 2015-12-10 DIAGNOSIS — E1322 Other specified diabetes mellitus with diabetic chronic kidney disease: Secondary | ICD-10-CM | POA: Diagnosis not present

## 2015-12-10 DIAGNOSIS — N2581 Secondary hyperparathyroidism of renal origin: Secondary | ICD-10-CM | POA: Diagnosis not present

## 2015-12-10 DIAGNOSIS — N186 End stage renal disease: Secondary | ICD-10-CM | POA: Diagnosis not present

## 2015-12-10 DIAGNOSIS — D631 Anemia in chronic kidney disease: Secondary | ICD-10-CM | POA: Diagnosis not present

## 2015-12-10 DIAGNOSIS — D509 Iron deficiency anemia, unspecified: Secondary | ICD-10-CM | POA: Diagnosis not present

## 2015-12-12 DIAGNOSIS — N189 Chronic kidney disease, unspecified: Secondary | ICD-10-CM | POA: Diagnosis not present

## 2015-12-13 ENCOUNTER — Encounter (HOSPITAL_COMMUNITY): Payer: Self-pay | Admitting: *Deleted

## 2015-12-13 DIAGNOSIS — Z992 Dependence on renal dialysis: Secondary | ICD-10-CM | POA: Diagnosis not present

## 2015-12-13 DIAGNOSIS — N2581 Secondary hyperparathyroidism of renal origin: Secondary | ICD-10-CM | POA: Diagnosis not present

## 2015-12-13 DIAGNOSIS — N186 End stage renal disease: Secondary | ICD-10-CM | POA: Diagnosis not present

## 2015-12-13 DIAGNOSIS — D631 Anemia in chronic kidney disease: Secondary | ICD-10-CM | POA: Diagnosis not present

## 2015-12-13 DIAGNOSIS — D509 Iron deficiency anemia, unspecified: Secondary | ICD-10-CM | POA: Diagnosis not present

## 2015-12-13 DIAGNOSIS — E1322 Other specified diabetes mellitus with diabetic chronic kidney disease: Secondary | ICD-10-CM | POA: Diagnosis not present

## 2015-12-13 DIAGNOSIS — E1122 Type 2 diabetes mellitus with diabetic chronic kidney disease: Secondary | ICD-10-CM | POA: Diagnosis not present

## 2015-12-13 NOTE — Progress Notes (Signed)
I sent a +cuurrent MAG from [facility] to Pharmacy.  I spoke with patients evening nurse, Seth Bake and faxed an instructions sheet with arrival time and medications to give the am of surgery and Insulin instructions.  I asked Seth Bake if patient had been on Plavix, I did not see it on the MAG and had instructions from Dr Trula Slade to hold times 5 days. Seth Bake checked patients record and reported that patient had been on Eliqus 2.5mg , 2 times a day  until 11/18/15.  On 11/18/15 Eliquis was placed on hold for surgery in January.  Information left in a note to Dr Trula Slade.

## 2015-12-13 NOTE — Progress Notes (Signed)
I spoke with Ms Ehler and her day shift nurse,Caroline.  Patient and nurse report that there has been no change in patient 's condition.  Patient denies chest pain or shortness of breath. I asked patients nurse to fax current MAG.

## 2015-12-13 NOTE — Pre-Procedure Instructions (Addendum)
    Deborah Hendricks  12/13/2015     Your procedure is scheduled on Thursday, February 2.  Report to [facility] Admitting at 7:45 A.M.   Call this number if you have problems the morning of [phone]   Remember:  Do not eat food or drink liquids after midnight Wednesday.  Take these medicines the morning of surgery with A SIP OF WATER : Metoprolol, Sertraline, Aspirin.       How to Manage Your Diabetes Before Surgery  What do I do about my diabetes medications?        The evening before surgery do not take any Novolog Insulin at Bedtime   THE MORNING OF SURGERY:  take 1/2 of scheduled Levemir Insulin    If your CBG is greater than 220 mg/dL, you may take 1/2 of your sliding scale (correction) dose of insulin    How do I manage my blood sugars before surgery   Check your blood sugar the morning of your surgery when you wake up and every 2  hours until you get to the Short-Stay unit.  If your blood sugar is less than 70 mg/dL, you will need to treat for low blood sugar by: Glucagon Injection  Recheck blood sugar in 15 minutes after treatment (to make sure it is greater than 70 mg/dL).  If blood sugar is not greater than 70 mg/dL on re-check, call [phone] for further instructions.   Report your blood sugar to the Short-Stay nurse when you get to Short-Stay.

## 2015-12-14 MED ORDER — VANCOMYCIN HCL IN DEXTROSE 1-5 GM/200ML-% IV SOLN
1000.0000 mg | INTRAVENOUS | Status: AC
  Administered 2015-12-15: 1000 mg via INTRAVENOUS
  Filled 2015-12-14: qty 200

## 2015-12-15 ENCOUNTER — Inpatient Hospital Stay (HOSPITAL_COMMUNITY): Payer: Medicare Other | Admitting: Certified Registered Nurse Anesthetist

## 2015-12-15 ENCOUNTER — Inpatient Hospital Stay (HOSPITAL_COMMUNITY)
Admission: RE | Admit: 2015-12-15 | Discharge: 2015-12-22 | DRG: 616 | Disposition: A | Discharge status: Skilled Nursing Facility | Payer: Medicare Other | Source: Ambulatory Visit | Attending: Surgery | Admitting: Surgery

## 2015-12-15 ENCOUNTER — Encounter (HOSPITAL_COMMUNITY): Payer: Self-pay | Admitting: Certified Registered Nurse Anesthetist

## 2015-12-15 ENCOUNTER — Encounter (HOSPITAL_COMMUNITY)
Admission: RE | Disposition: A | Discharge status: Skilled Nursing Facility | Payer: Self-pay | Source: Ambulatory Visit | Attending: Surgery

## 2015-12-15 DIAGNOSIS — N2581 Secondary hyperparathyroidism of renal origin: Secondary | ICD-10-CM | POA: Diagnosis present

## 2015-12-15 DIAGNOSIS — I70262 Atherosclerosis of native arteries of extremities with gangrene, left leg: Secondary | ICD-10-CM | POA: Diagnosis not present

## 2015-12-15 DIAGNOSIS — F419 Anxiety disorder, unspecified: Secondary | ICD-10-CM | POA: Diagnosis present

## 2015-12-15 DIAGNOSIS — E11621 Type 2 diabetes mellitus with foot ulcer: Principal | ICD-10-CM | POA: Diagnosis present

## 2015-12-15 DIAGNOSIS — Z8673 Personal history of transient ischemic attack (TIA), and cerebral infarction without residual deficits: Secondary | ICD-10-CM | POA: Diagnosis not present

## 2015-12-15 DIAGNOSIS — Z880 Allergy status to penicillin: Secondary | ICD-10-CM

## 2015-12-15 DIAGNOSIS — B999 Unspecified infectious disease: Secondary | ICD-10-CM | POA: Diagnosis not present

## 2015-12-15 DIAGNOSIS — J189 Pneumonia, unspecified organism: Secondary | ICD-10-CM

## 2015-12-15 DIAGNOSIS — Z89619 Acquired absence of unspecified leg above knee: Secondary | ICD-10-CM | POA: Diagnosis not present

## 2015-12-15 DIAGNOSIS — F329 Major depressive disorder, single episode, unspecified: Secondary | ICD-10-CM | POA: Diagnosis present

## 2015-12-15 DIAGNOSIS — I120 Hypertensive chronic kidney disease with stage 5 chronic kidney disease or end stage renal disease: Secondary | ICD-10-CM | POA: Diagnosis not present

## 2015-12-15 DIAGNOSIS — L97829 Non-pressure chronic ulcer of other part of left lower leg with unspecified severity: Secondary | ICD-10-CM | POA: Diagnosis not present

## 2015-12-15 DIAGNOSIS — Z992 Dependence on renal dialysis: Secondary | ICD-10-CM | POA: Diagnosis not present

## 2015-12-15 DIAGNOSIS — J9811 Atelectasis: Secondary | ICD-10-CM | POA: Diagnosis not present

## 2015-12-15 DIAGNOSIS — L97529 Non-pressure chronic ulcer of other part of left foot with unspecified severity: Secondary | ICD-10-CM | POA: Diagnosis present

## 2015-12-15 DIAGNOSIS — N186 End stage renal disease: Secondary | ICD-10-CM

## 2015-12-15 DIAGNOSIS — R509 Fever, unspecified: Secondary | ICD-10-CM | POA: Diagnosis not present

## 2015-12-15 DIAGNOSIS — R5082 Postprocedural fever: Secondary | ICD-10-CM | POA: Diagnosis not present

## 2015-12-15 DIAGNOSIS — I509 Heart failure, unspecified: Secondary | ICD-10-CM | POA: Diagnosis not present

## 2015-12-15 DIAGNOSIS — Z7901 Long term (current) use of anticoagulants: Secondary | ICD-10-CM | POA: Diagnosis not present

## 2015-12-15 DIAGNOSIS — E119 Type 2 diabetes mellitus without complications: Secondary | ICD-10-CM | POA: Diagnosis not present

## 2015-12-15 DIAGNOSIS — I739 Peripheral vascular disease, unspecified: Secondary | ICD-10-CM | POA: Diagnosis present

## 2015-12-15 DIAGNOSIS — I998 Other disorder of circulatory system: Secondary | ICD-10-CM | POA: Diagnosis present

## 2015-12-15 DIAGNOSIS — E1122 Type 2 diabetes mellitus with diabetic chronic kidney disease: Secondary | ICD-10-CM | POA: Diagnosis not present

## 2015-12-15 DIAGNOSIS — Z7982 Long term (current) use of aspirin: Secondary | ICD-10-CM | POA: Diagnosis not present

## 2015-12-15 DIAGNOSIS — I132 Hypertensive heart and chronic kidney disease with heart failure and with stage 5 chronic kidney disease, or end stage renal disease: Secondary | ICD-10-CM | POA: Diagnosis present

## 2015-12-15 DIAGNOSIS — G8928 Other chronic postprocedural pain: Secondary | ICD-10-CM | POA: Diagnosis not present

## 2015-12-15 DIAGNOSIS — F0390 Unspecified dementia without behavioral disturbance: Secondary | ICD-10-CM | POA: Diagnosis not present

## 2015-12-15 DIAGNOSIS — Z87891 Personal history of nicotine dependence: Secondary | ICD-10-CM | POA: Diagnosis not present

## 2015-12-15 DIAGNOSIS — K219 Gastro-esophageal reflux disease without esophagitis: Secondary | ICD-10-CM | POA: Diagnosis not present

## 2015-12-15 DIAGNOSIS — Z79899 Other long term (current) drug therapy: Secondary | ICD-10-CM

## 2015-12-15 DIAGNOSIS — E1151 Type 2 diabetes mellitus with diabetic peripheral angiopathy without gangrene: Secondary | ICD-10-CM | POA: Diagnosis present

## 2015-12-15 DIAGNOSIS — Z7902 Long term (current) use of antithrombotics/antiplatelets: Secondary | ICD-10-CM

## 2015-12-15 DIAGNOSIS — R1312 Dysphagia, oropharyngeal phase: Secondary | ICD-10-CM | POA: Diagnosis not present

## 2015-12-15 DIAGNOSIS — I10 Essential (primary) hypertension: Secondary | ICD-10-CM | POA: Diagnosis present

## 2015-12-15 DIAGNOSIS — F603 Borderline personality disorder: Secondary | ICD-10-CM | POA: Diagnosis not present

## 2015-12-15 DIAGNOSIS — D649 Anemia, unspecified: Secondary | ICD-10-CM | POA: Diagnosis not present

## 2015-12-15 DIAGNOSIS — J181 Lobar pneumonia, unspecified organism: Secondary | ICD-10-CM | POA: Diagnosis not present

## 2015-12-15 DIAGNOSIS — I70245 Atherosclerosis of native arteries of left leg with ulceration of other part of foot: Secondary | ICD-10-CM | POA: Diagnosis present

## 2015-12-15 DIAGNOSIS — Z794 Long term (current) use of insulin: Secondary | ICD-10-CM

## 2015-12-15 DIAGNOSIS — Z4781 Encounter for orthopedic aftercare following surgical amputation: Secondary | ICD-10-CM | POA: Diagnosis not present

## 2015-12-15 DIAGNOSIS — G4700 Insomnia, unspecified: Secondary | ICD-10-CM | POA: Diagnosis not present

## 2015-12-15 DIAGNOSIS — L98499 Non-pressure chronic ulcer of skin of other sites with unspecified severity: Secondary | ICD-10-CM | POA: Diagnosis not present

## 2015-12-15 DIAGNOSIS — M6281 Muscle weakness (generalized): Secondary | ICD-10-CM | POA: Diagnosis not present

## 2015-12-15 DIAGNOSIS — E875 Hyperkalemia: Secondary | ICD-10-CM | POA: Diagnosis not present

## 2015-12-15 DIAGNOSIS — Z09 Encounter for follow-up examination after completed treatment for conditions other than malignant neoplasm: Secondary | ICD-10-CM

## 2015-12-15 DIAGNOSIS — F339 Major depressive disorder, recurrent, unspecified: Secondary | ICD-10-CM | POA: Diagnosis not present

## 2015-12-15 HISTORY — PX: AMPUTATION: SHX166

## 2015-12-15 LAB — GLUCOSE, CAPILLARY
GLUCOSE-CAPILLARY: 116 mg/dL — AB (ref 65–99)
GLUCOSE-CAPILLARY: 129 mg/dL — AB (ref 65–99)
GLUCOSE-CAPILLARY: 127 mg/dL — AB (ref 65–99)
GLUCOSE-CAPILLARY: 148 mg/dL — AB (ref 65–99)

## 2015-12-15 LAB — BASIC METABOLIC PANEL
ANION GAP: 14 (ref 5–15)
BUN: 52 mg/dL — ABNORMAL HIGH (ref 6–20)
CALCIUM: 9.7 mg/dL (ref 8.9–10.3)
CHLORIDE: 99 mmol/L — AB (ref 101–111)
CO2: 26 mmol/L (ref 22–32)
CREATININE: 6.34 mg/dL — AB (ref 0.44–1.00)
GFR calc Af Amer: 7 mL/min — ABNORMAL LOW (ref >60)
GFR calc non Af Amer: 6 mL/min — ABNORMAL LOW (ref >60)
GLUCOSE: 124 mg/dL — AB (ref 65–99)
POTASSIUM: 4.6 mmol/L (ref 3.5–5.1)
SODIUM: 139 mmol/L (ref 135–145)

## 2015-12-15 LAB — POCT I-STAT 4, (NA,K, GLUC, HGB,HCT)
GLUCOSE: 118 mg/dL — AB (ref 65–99)
Glucose, Bld: 178 mg/dL — ABNORMAL HIGH (ref 65–99)
HCT: 31 % — ABNORMAL LOW (ref 36.0–46.0)
HEMATOCRIT: 41 % (ref 36.0–46.0)
Hemoglobin: 13.9 g/dL (ref 12.0–15.0)
Hemoglobin: 10.5 g/dL — ABNORMAL LOW (ref 12.0–15.0)
POTASSIUM: 3 mmol/L — AB (ref 3.5–5.1)
Potassium: 6 mmol/L — ABNORMAL HIGH (ref 3.5–5.1)
SODIUM: 135 mmol/L (ref 135–145)
SODIUM: 140 mmol/L (ref 135–145)

## 2015-12-15 SURGERY — AMPUTATION, ABOVE KNEE
Anesthesia: General | Site: Leg Upper | Laterality: Left

## 2015-12-15 MED ORDER — ASPIRIN EC 325 MG PO TBEC
325.0000 mg | DELAYED_RELEASE_TABLET | Freq: Every day | ORAL | Status: DC
  Administered 2015-12-16 – 2015-12-22 (×6): 325 mg via ORAL
  Filled 2015-12-15 (×7): qty 1

## 2015-12-15 MED ORDER — LIDOCAINE HCL (CARDIAC) 20 MG/ML IV SOLN
INTRAVENOUS | Status: DC | PRN
Start: 2015-12-15 — End: 2015-12-15
  Administered 2015-12-15: 80 mg via INTRAVENOUS

## 2015-12-15 MED ORDER — OXYCODONE HCL 5 MG PO TABS: 5.0000 mg | ORAL_TABLET | Freq: Once | ORAL | Status: AC | PRN

## 2015-12-15 MED ORDER — GUAIFENESIN-DM 100-10 MG/5ML PO SYRP: 15.0000 mL | ORAL_SOLUTION | ORAL | Status: DC | PRN

## 2015-12-15 MED ORDER — CALCIUM CHLORIDE 10 % IV SOLN
INTRAVENOUS | Status: DC | PRN
  Administered 2015-12-15 (×2): 250 mg via INTRAVENOUS

## 2015-12-15 MED ORDER — CARVEDILOL 12.5 MG PO TABS: 12.5000 mg | ORAL_TABLET | ORAL | Status: DC

## 2015-12-15 MED ORDER — OXYCODONE HCL 5 MG/5ML PO SOLN
5.0000 mg | Freq: Once | ORAL | Status: AC | PRN
  Administered 2015-12-15: 5 mg via ORAL

## 2015-12-15 MED ORDER — BISACODYL 10 MG RE SUPP
10.0000 mg | Freq: Every day | RECTAL | Status: DC | PRN
  Administered 2015-12-18: 10 mg via RECTAL
  Filled 2015-12-15: qty 1

## 2015-12-15 MED ORDER — DEXTROSE 50 % IV SOLN
INTRAVENOUS | Status: AC
  Filled 2015-12-15: qty 50

## 2015-12-15 MED ORDER — MIRTAZAPINE 15 MG PO TABS
15.0000 mg | ORAL_TABLET | Freq: Every day | ORAL | Status: DC
  Administered 2015-12-15 – 2015-12-21 (×6): 15 mg via ORAL
  Filled 2015-12-15 (×10): qty 1

## 2015-12-15 MED ORDER — ONDANSETRON HCL 4 MG/2ML IJ SOLN
INTRAMUSCULAR | Status: DC | PRN
  Administered 2015-12-15: 4 mg via INTRAVENOUS

## 2015-12-15 MED ORDER — OXYCODONE-ACETAMINOPHEN 5-325 MG PO TABS
1.0000 | ORAL_TABLET | ORAL | Status: DC | PRN
  Administered 2015-12-16 (×2): 2 via ORAL
  Administered 2015-12-17: 1 via ORAL
  Filled 2015-12-15: qty 2
  Filled 2015-12-15: qty 1
  Filled 2015-12-15: qty 2

## 2015-12-15 MED ORDER — GABAPENTIN 300 MG PO CAPS
300.0000 mg | ORAL_CAPSULE | Freq: Three times a day (TID) | ORAL | Status: DC
  Administered 2015-12-15 – 2015-12-22 (×17): 300 mg via ORAL
  Filled 2015-12-15 (×21): qty 1

## 2015-12-15 MED ORDER — 0.9 % SODIUM CHLORIDE (POUR BTL) OPTIME
TOPICAL | Status: DC | PRN
  Administered 2015-12-15: 1000 mL

## 2015-12-15 MED ORDER — SODIUM CHLORIDE 0.9% FLUSH: 3.0000 mL | INTRAVENOUS | Status: DC | PRN

## 2015-12-15 MED ORDER — OXYCODONE HCL 5 MG/5ML PO SOLN
ORAL | Status: AC
  Filled 2015-12-15: qty 5

## 2015-12-15 MED ORDER — ACETAMINOPHEN 325 MG RE SUPP
325.0000 mg | RECTAL | Status: DC | PRN
  Filled 2015-12-15: qty 2

## 2015-12-15 MED ORDER — MAGNESIUM OXIDE 400 (241.3 MG) MG PO TABS: 400.0000 mg | ORAL_TABLET | Freq: Every day | ORAL | Status: DC

## 2015-12-15 MED ORDER — METOPROLOL TARTRATE 1 MG/ML IV SOLN
2.0000 mg | INTRAVENOUS | Status: DC | PRN
Start: 2015-12-15 — End: 2015-12-21

## 2015-12-15 MED ORDER — PHENYLEPHRINE HCL 10 MG/ML IJ SOLN
10.0000 mg | INTRAVENOUS | Status: DC | PRN
  Administered 2015-12-15: 20 ug/min via INTRAVENOUS

## 2015-12-15 MED ORDER — VANCOMYCIN HCL 500 MG IV SOLR
500.0000 mg | Freq: Once | INTRAVENOUS | Status: AC
  Administered 2015-12-16: 500 mg via INTRAVENOUS
  Filled 2015-12-15 (×2): qty 500

## 2015-12-15 MED ORDER — NEPRO/CARBSTEADY PO LIQD
237.0000 mL | Freq: Two times a day (BID) | ORAL | Status: DC
  Administered 2015-12-17 – 2015-12-22 (×8): 237 mL via ORAL
  Filled 2015-12-15 (×17): qty 237

## 2015-12-15 MED ORDER — PROPOFOL 10 MG/ML IV BOLUS
INTRAVENOUS | Status: DC | PRN
  Administered 2015-12-15: 20 mg via INTRAVENOUS
  Administered 2015-12-15: 30 mg via INTRAVENOUS
  Administered 2015-12-15: 150 mg via INTRAVENOUS

## 2015-12-15 MED ORDER — LORAZEPAM 0.5 MG PO TABS
0.5000 mg | ORAL_TABLET | Freq: Every day | ORAL | Status: DC
  Administered 2015-12-15 – 2015-12-20 (×5): 0.5 mg via ORAL
  Filled 2015-12-15 (×5): qty 1

## 2015-12-15 MED ORDER — HYDROMORPHONE HCL 1 MG/ML IJ SOLN
INTRAMUSCULAR | Status: AC
  Filled 2015-12-15: qty 1

## 2015-12-15 MED ORDER — ALUM & MAG HYDROXIDE-SIMETH 200-200-20 MG/5ML PO SUSP
15.0000 mL | ORAL | Status: DC | PRN
Start: 2015-12-15 — End: 2015-12-22

## 2015-12-15 MED ORDER — PHENOL 1.4 % MT LIQD: 1.0000 | OROMUCOSAL | Status: DC | PRN

## 2015-12-15 MED ORDER — PHENYLEPHRINE HCL 10 MG/ML IJ SOLN
INTRAMUSCULAR | Status: DC | PRN
  Administered 2015-12-15: 80 ug via INTRAVENOUS

## 2015-12-15 MED ORDER — SODIUM CHLORIDE 0.9% FLUSH
3.0000 mL | Freq: Two times a day (BID) | INTRAVENOUS | Status: DC
Start: 2015-12-15 — End: 2015-12-22
  Administered 2015-12-15 – 2015-12-21 (×10): 3 mL via INTRAVENOUS

## 2015-12-15 MED ORDER — MIDAZOLAM HCL 2 MG/2ML IJ SOLN
INTRAMUSCULAR | Status: AC
Start: 2015-12-15 — End: 2015-12-15
  Filled 2015-12-15: qty 2

## 2015-12-15 MED ORDER — FENTANYL CITRATE (PF) 100 MCG/2ML IJ SOLN
INTRAMUSCULAR | Status: DC | PRN
  Administered 2015-12-15 (×2): 50 ug via INTRAVENOUS
  Administered 2015-12-15: 25 ug via INTRAVENOUS

## 2015-12-15 MED ORDER — PROMETHAZINE HCL 25 MG/ML IJ SOLN: 6.2500 mg | INTRAMUSCULAR | Status: DC | PRN

## 2015-12-15 MED ORDER — ONDANSETRON HCL 4 MG PO TABS: 4.0000 mg | ORAL_TABLET | Freq: Three times a day (TID) | ORAL | Status: DC | PRN

## 2015-12-15 MED ORDER — MORPHINE SULFATE (PF) 2 MG/ML IV SOLN
2.0000 mg | INTRAVENOUS | Status: DC | PRN
  Administered 2015-12-15 – 2015-12-17 (×3): 2 mg via INTRAVENOUS
  Filled 2015-12-15 (×3): qty 1

## 2015-12-15 MED ORDER — VANCOMYCIN HCL IN DEXTROSE 1-5 GM/200ML-% IV SOLN: 1000.0000 mg | Freq: Two times a day (BID) | INTRAVENOUS | Status: DC

## 2015-12-15 MED ORDER — HYDROMORPHONE HCL 1 MG/ML IJ SOLN
0.2500 mg | INTRAMUSCULAR | Status: DC | PRN
  Administered 2015-12-15 (×3): 0.5 mg via INTRAVENOUS

## 2015-12-15 MED ORDER — EPHEDRINE SULFATE 50 MG/ML IJ SOLN
INTRAMUSCULAR | Status: DC | PRN
  Administered 2015-12-15: 5 mg via INTRAVENOUS

## 2015-12-15 MED ORDER — FENTANYL CITRATE (PF) 250 MCG/5ML IJ SOLN
INTRAMUSCULAR | Status: AC
  Filled 2015-12-15: qty 5

## 2015-12-15 MED ORDER — INSULIN ASPART 100 UNIT/ML ~~LOC~~ SOLN
SUBCUTANEOUS | Status: DC | PRN
  Administered 2015-12-15: 10 [IU] via INTRAVENOUS

## 2015-12-15 MED ORDER — SODIUM CHLORIDE 0.9 % IV SOLN: 250.0000 mL | INTRAVENOUS | Status: DC | PRN

## 2015-12-15 MED ORDER — LACTULOSE 10 GM/15ML PO SOLN
20.0000 g | Freq: Two times a day (BID) | ORAL | Status: DC | PRN
  Administered 2015-12-18: 20 g via ORAL
  Filled 2015-12-15: qty 30

## 2015-12-15 MED ORDER — SODIUM CHLORIDE 0.9 % IV SOLN
INTRAVENOUS | Status: DC
  Administered 2015-12-15: 10:00:00 via INTRAVENOUS

## 2015-12-15 MED ORDER — DOCUSATE SODIUM 100 MG PO CAPS
100.0000 mg | ORAL_CAPSULE | Freq: Every day | ORAL | Status: DC
  Administered 2015-12-16 – 2015-12-22 (×6): 100 mg via ORAL
  Filled 2015-12-15 (×7): qty 1

## 2015-12-15 MED ORDER — PROPOFOL 10 MG/ML IV BOLUS
INTRAVENOUS | Status: AC
  Filled 2015-12-15: qty 20

## 2015-12-15 MED ORDER — SODIUM CHLORIDE 0.9 % IV SOLN: INTRAVENOUS | Status: DC

## 2015-12-15 MED ORDER — KETAMINE HCL 100 MG/ML IJ SOLN
INTRAMUSCULAR | Status: AC
  Filled 2015-12-15: qty 1

## 2015-12-15 MED ORDER — ONDANSETRON HCL 4 MG/2ML IJ SOLN
4.0000 mg | Freq: Four times a day (QID) | INTRAMUSCULAR | Status: DC | PRN
  Administered 2015-12-16 – 2015-12-18 (×2): 4 mg via INTRAVENOUS
  Filled 2015-12-15 (×2): qty 2

## 2015-12-15 MED ORDER — LABETALOL HCL 5 MG/ML IV SOLN
10.0000 mg | INTRAVENOUS | Status: DC | PRN
  Administered 2015-12-15 (×3): 10 mg via INTRAVENOUS
  Filled 2015-12-15 (×2): qty 4

## 2015-12-15 MED ORDER — HYDRALAZINE HCL 20 MG/ML IJ SOLN: 5.0000 mg | INTRAMUSCULAR | Status: DC | PRN

## 2015-12-15 MED ORDER — CARVEDILOL 12.5 MG PO TABS
12.5000 mg | ORAL_TABLET | ORAL | Status: DC
  Administered 2015-12-15: 12.5 mg via ORAL
  Filled 2015-12-15: qty 1

## 2015-12-15 MED ORDER — CHLORHEXIDINE GLUCONATE CLOTH 2 % EX PADS: 6.0000 | MEDICATED_PAD | Freq: Once | CUTANEOUS | Status: DC

## 2015-12-15 MED ORDER — OXYCODONE HCL 5 MG PO TABS
5.0000 mg | ORAL_TABLET | Freq: Four times a day (QID) | ORAL | Status: DC | PRN
  Administered 2015-12-15 – 2015-12-16 (×3): 5 mg via ORAL
  Filled 2015-12-15 (×2): qty 1

## 2015-12-15 MED ORDER — ACETAMINOPHEN 325 MG PO TABS
325.0000 mg | ORAL_TABLET | ORAL | Status: DC | PRN
  Administered 2015-12-18 – 2015-12-21 (×4): 650 mg via ORAL
  Filled 2015-12-15 (×5): qty 2

## 2015-12-15 MED ORDER — DEXTROSE 50 % IV SOLN
INTRAVENOUS | Status: DC | PRN
  Administered 2015-12-15: 25 g via INTRAVENOUS

## 2015-12-15 MED ORDER — GLUCAGON HCL RDNA (DIAGNOSTIC) 1 MG IJ SOLR: 1.0000 mg | Freq: Once | INTRAMUSCULAR | Status: DC | PRN

## 2015-12-15 MED ORDER — PANTOPRAZOLE SODIUM 40 MG PO TBEC
40.0000 mg | DELAYED_RELEASE_TABLET | Freq: Every day | ORAL | Status: DC
Start: 2015-12-15 — End: 2015-12-22
  Administered 2015-12-15 – 2015-12-22 (×7): 40 mg via ORAL
  Filled 2015-12-15 (×8): qty 1

## 2015-12-15 MED ORDER — KETAMINE HCL 100 MG/ML IJ SOLN
INTRAMUSCULAR | Status: DC | PRN
  Administered 2015-12-15 (×2): 30 mg via INTRAVENOUS
  Administered 2015-12-15: 20 mg via INTRAVENOUS

## 2015-12-15 MED ORDER — PROPOFOL 10 MG/ML IV BOLUS: INTRAVENOUS | Status: DC | PRN

## 2015-12-15 SURGICAL SUPPLY — 51 items
BANDAGE ACE 4X5 VEL STRL LF (GAUZE/BANDAGES/DRESSINGS) ×3 IMPLANT
BANDAGE ACE 6X5 VEL STRL LF (GAUZE/BANDAGES/DRESSINGS) ×3 IMPLANT
BANDAGE ELASTIC 4 VELCRO ST LF (GAUZE/BANDAGES/DRESSINGS) ×3 IMPLANT
BANDAGE ELASTIC 6 VELCRO ST LF (GAUZE/BANDAGES/DRESSINGS) ×3 IMPLANT
BNDG COHESIVE 6X5 TAN STRL LF (GAUZE/BANDAGES/DRESSINGS) ×3 IMPLANT
BNDG GAUZE ELAST 4 BULKY (GAUZE/BANDAGES/DRESSINGS) ×6 IMPLANT
CANISTER SUCTION 2500CC (MISCELLANEOUS) ×3 IMPLANT
CLIP TI MEDIUM 6 (CLIP) ×3 IMPLANT
COVER SURGICAL LIGHT HANDLE (MISCELLANEOUS) ×3 IMPLANT
DRAIN CHANNEL 19F RND (DRAIN) IMPLANT
DRAPE ORTHO SPLIT 77X108 STRL (DRAPES) ×4
DRAPE PROXIMA HALF (DRAPES) ×3 IMPLANT
DRAPE SURG ORHT 6 SPLT 77X108 (DRAPES) ×2 IMPLANT
DRSG ADAPTIC 3X8 NADH LF (GAUZE/BANDAGES/DRESSINGS) ×3 IMPLANT
ELECT REM PT RETURN 9FT ADLT (ELECTROSURGICAL) ×3
ELECTRODE REM PT RTRN 9FT ADLT (ELECTROSURGICAL) ×1 IMPLANT
EVACUATOR SILICONE 100CC (DRAIN) IMPLANT
GAUZE SPONGE 4X4 12PLY STRL (GAUZE/BANDAGES/DRESSINGS) ×6 IMPLANT
GAUZE SPONGE 4X4 16PLY XRAY LF (GAUZE/BANDAGES/DRESSINGS) ×3 IMPLANT
GLOVE BIOGEL PI IND STRL 6.5 (GLOVE) ×3 IMPLANT
GLOVE BIOGEL PI IND STRL 7.5 (GLOVE) ×1 IMPLANT
GLOVE BIOGEL PI IND STRL 8 (GLOVE) ×1 IMPLANT
GLOVE BIOGEL PI INDICATOR 6.5 (GLOVE) ×6
GLOVE BIOGEL PI INDICATOR 7.5 (GLOVE) ×2
GLOVE BIOGEL PI INDICATOR 8 (GLOVE) ×2
GLOVE SURG SS PI 6.5 STRL IVOR (GLOVE) ×3 IMPLANT
GLOVE SURG SS PI 7.5 STRL IVOR (GLOVE) ×9 IMPLANT
GOWN STRL REUS W/ TWL LRG LVL3 (GOWN DISPOSABLE) ×2 IMPLANT
GOWN STRL REUS W/ TWL XL LVL3 (GOWN DISPOSABLE) ×2 IMPLANT
GOWN STRL REUS W/TWL LRG LVL3 (GOWN DISPOSABLE) ×4
GOWN STRL REUS W/TWL XL LVL3 (GOWN DISPOSABLE) ×4
KIT BASIN OR (CUSTOM PROCEDURE TRAY) ×3 IMPLANT
KIT ROOM TURNOVER OR (KITS) ×3 IMPLANT
NS IRRIG 1000ML POUR BTL (IV SOLUTION) ×3 IMPLANT
PACK GENERAL/GYN (CUSTOM PROCEDURE TRAY) ×3 IMPLANT
PAD ARMBOARD 7.5X6 YLW CONV (MISCELLANEOUS) ×6 IMPLANT
SAW GIGLI STERILE 20 (MISCELLANEOUS) ×3 IMPLANT
SPONGE GAUZE 4X4 12PLY STER LF (GAUZE/BANDAGES/DRESSINGS) ×3 IMPLANT
STAPLER VISISTAT 35W (STAPLE) ×3 IMPLANT
STOCKINETTE IMPERVIOUS LG (DRAPES) ×3 IMPLANT
SUT ETHILON 3 0 PS 1 (SUTURE) IMPLANT
SUT SILK 0 TIES 10X30 (SUTURE) ×3 IMPLANT
SUT SILK 2 0 (SUTURE) ×2
SUT SILK 2 0 SH CR/8 (SUTURE) ×3 IMPLANT
SUT SILK 2-0 18XBRD TIE 12 (SUTURE) ×1 IMPLANT
SUT SILK 3 0 (SUTURE) ×2
SUT SILK 3-0 18XBRD TIE 12 (SUTURE) ×1 IMPLANT
SUT VIC AB 2-0 CT1 18 (SUTURE) ×6 IMPLANT
TAPE UMBILICAL COTTON 1/8X30 (MISCELLANEOUS) IMPLANT
UNDERPAD 30X30 INCONTINENT (UNDERPADS AND DIAPERS) ×3 IMPLANT
WATER STERILE IRR 1000ML POUR (IV SOLUTION) ×3 IMPLANT

## 2015-12-15 NOTE — H&P (View-Only) (Signed)
Patient name: Deborah Hendricks MRN: [id number] DOB: December 02, 1945 Sex: female     Chief Complaint  Patient presents with  . Re-evaluation    eval LE - add on     HISTORY OF PRESENT ILLNESS:  the patient is here today for evaluation of a left foot ulcer.  She is well known to me having undergone first stage right basilic vein transposition approximately 2 weeks ago.  She is a hemodialysis patient on Tuesday Thursday Saturday. She has had a wound on her left first and fifth toe for a few months. She underwent angiography by Dr. Gwenlyn Found and does not have any options for revascularization.  Her wounds are getting worse and she was sent over for discussions of amputation. The patient has not walked since last summer  The patient suffers from diabetes which has not been well controlled. She is medically managed for hypertension. She has a history of stroke. She has been on Coumadin in the past but is now on Plavix.  Past Medical History  Diagnosis Date  . Diabetes mellitus without complication ([place])   . Hypertension   . Anemia   . Critical lower limb ischemia   . Stroke ([place]) 04/2015    unable to stand .  can have diffuuclty with speech at times  . Seizures ([place]) 04/2015    since   . Kidney disease     ESRD  . Depression   . Anxiety   . Constipation   . Memory loss     since stroke 04/2015  . Peripheral vascular disease ([place])   . CHF (congestive heart failure) [facility])     Past Surgical History  Procedure Laterality Date  . Toe amputation      R foot  . Abdominal hysterectomy    . Tonsillectomy    . Bascilic vein transposition Right 11/25/2015    Procedure: Right Arm FIRST STAGE BASILIC VEIN TRANSPOSITION;  Surgeon: Serafina Mitchell, MD;  Location: [facility];  Service: Vascular;  Laterality: Right;  . Peripheral vascular catheterization Bilateral 11/28/2015    Procedure: Lower Extremity Angiography;  Surgeon: Lorretta Harp, MD;  Location: [facility];  Service: Cardiovascular;   Laterality: Bilateral;  . Peripheral vascular catheterization N/A 11/28/2015    Procedure: Abdominal Aortogram;  Surgeon: Lorretta Harp, MD;  Location: [facility];  Service: Cardiovascular;  Laterality: N/A;    Social History   Social History  . Marital Status: Single    Spouse Name: N/A  . Number of Children: N/A  . Years of Education: N/A   Occupational History  . Not on file.   Social History Main Topics  . Smoking status: Former Smoker    Quit date: 11/17/2013  . Smokeless tobacco: Never Used  . Alcohol Use: No  . Drug Use: No  . Sexual Activity: Not on file   Other Topics Concern  . Not on file   Social History Narrative    Family History  Problem Relation Age of Onset  . Asthma Mother   . Cancer Sister     Allergies as of 12/05/2015 - Review Complete 12/05/2015  Allergen Reaction Noted  . Amoxicillin  11/24/2015  . Latex  11/24/2015  . Sulfa antibiotics  11/24/2015  . Penicillins Hives, Itching, and Rash 01/28/2014    Current Outpatient Prescriptions on File Prior to Visit  Medication Sig Dispense Refill  . aspirin EC 325 MG tablet Take 325 mg by mouth daily.    [name]  carvedilol (COREG) 12.5 MG tablet Take 12.5 mg by mouth every Tuesday, Thursday, and Saturday at 6 PM.    . carvedilol (COREG) 12.5 MG tablet Take 12.5 mg by mouth See admin instructions. Take 12.5 mg every sun, mon, wed, and fri twice daily    . esomeprazole (NEXIUM) 20 MG capsule Take 20 mg by mouth daily at 12 noon.    [name] glucagon 1 MG injection Inject 1 mg into the vein once as needed (hypoglycemia).    . insulin aspart (NOVOLOG) 100 UNIT/ML injection Inject 2-10 Units into the skin 3 (three) times daily before meals. 151-200 = 2 units, 201-250 = 3 units, 251-300 = 4 unit, 301-350 = 6 units, 351-400 = 8 units, 401-999 = 10 units    . insulin detemir (LEVEMIR) 100 UNIT/ML injection Inject 18 Units into the skin daily.    [name] lactulose (CHRONULAC) 10 GM/15ML solution Take 20 g by mouth 2  (two) times daily as needed for mild constipation.    [name] LORazepam (ATIVAN) 0.5 MG tablet Take 0.5 mg by mouth at bedtime.    . magnesium oxide (MAG-OX) 400 MG tablet Take 400 mg by mouth daily.    . mirtazapine (REMERON) 15 MG tablet Take 15 mg by mouth at bedtime.    . Nutritional Supplements (FEEDING SUPPLEMENT, NEPRO CARB STEADY,) LIQD Take 237 mLs by mouth 2 (two) times daily between meals.    . ondansetron (ZOFRAN) 4 MG tablet Take 4 mg by mouth every 8 (eight) hours as needed for nausea or vomiting.    [name] oxyCODONE-acetaminophen (PERCOCET/ROXICET) 5-325 MG tablet Take 1-2 tablets by mouth every 4 (four) hours as needed for moderate pain or severe pain. 30 tablet 0  . collagenase (SANTYL) ointment Apply 1 application topically daily. (Patient not taking: Reported on 11/24/2015) 15 g 0  . doxycycline (VIBRA-TABS) 100 MG tablet Take 1 tablet (100 mg total) by mouth 2 (two) times daily. (Patient not taking: Reported on 11/24/2015) 20 tablet 0  . silver sulfADIAZINE (SILVADENE) 1 % cream Apply 1 application topically daily. (Patient not taking: Reported on 11/29/2015) 50 g 0   No current facility-administered medications on file prior to visit.     REVIEW OF SYSTEMS: Cardiovascular: No chest pain,  Pulmonary: No productive cough, asthma or wheezing. Hematologic: No bleeding problems or clotting disorders. Musculoskeletal:  Non-ambulatory Gastrointestinal: No blood in stool or hematemesis Genitourinary: No dysuria or hematuria. Psychiatric:: No history of major depression. Integumentary:  Left foot ulcer Constitutional: No fever or chills.  PHYSICAL EXAMINATION:   Vital signs are  Filed Vitals:   12/05/15 1602  BP: 122/43  Pulse: 41  Temp: 97.4 F (36.3 C)  TempSrc: Oral  Height: 5' (1.524 m)  Weight: 145 lb (65.772 kg)  SpO2: 99%   Body mass index is 28.32 kg/(m^2). General: The patient appears their stated age. HEENT:  No gross abnormalities Pulmonary:  Non labored  breathing Musculoskeletal: There are no major deformities. Neurologic: No focal weakness or paresthesias are detected, Skin: There are no ulcer or rashes noted. Psychiatric: The patient has normal affect. Cardiovascular:  A thrill within basilic vein transposition   Diagnostic Studies  I have reviewed the arteriogram by Dr. Gwenlyn Found which shows no options for revascularization and occlusion of the popliteal artery  Assessment:  nonhealing wound, left foot Plan:  I discussed options with the patient and her daughter. We discussed levels of amputation : TMA, BKA, versus AKA.  I think she has a high risk of non-healing if  we do anything on the foot. There is  Also a high likelihood that she would not heal a below-knee amputation given that she has not walked in almost a year I don't think there is a significant advantage to try and save the knee.  Therefore we have agreed to proceed with an above-knee amputation.   I also discussed with the patient that this is not something that needs to be done immediately as she is not having any pain and does not have any infection.  The patient states that she wants to just get this over with.  Therefore have scheduled her operation for Thursday February second.  She will be off her Plavix for 5 days.  Eldridge Abrahams, M.D. [facility] Office: [phone] Pager:  [phone]

## 2015-12-15 NOTE — Progress Notes (Signed)
Utilization review completed.  

## 2015-12-15 NOTE — Anesthesia Preprocedure Evaluation (Addendum)
Anesthesia Evaluation  Patient identified by MRN, date of birth, ID band Patient awake    Reviewed: Allergy & Precautions, NPO status , Patient's Chart, lab work & pertinent test results  Airway Mallampati: III  TM Distance: >3 FB Neck ROM: Full    Dental no notable dental hx.    Pulmonary former smoker,    Pulmonary exam normal breath sounds clear to auscultation       Cardiovascular hypertension, + Peripheral Vascular Disease  Normal cardiovascular exam Rhythm:Regular Rate:Normal  Echo 04/01/12 [facility] in [place] [facility]):  1. Normal LV size and function, overall EF 60% 2. Biatrial enlargement 3. Mild pulmonary HTN   Neuro/Psych Seizures -, Well Controlled,  PSYCHIATRIC DISORDERS Anxiety Depression CVA    GI/Hepatic negative GI ROS, Neg liver ROS,   Endo/Other  diabetes  Renal/GU DialysisRenal disease  negative genitourinary   Musculoskeletal negative musculoskeletal ROS (+)   Abdominal   Peds negative pediatric ROS (+)  Hematology  (+) JEHOVAH'S WITNESS  Anesthesia Other Findings   Reproductive/Obstetrics negative OB ROS                           Anesthesia Physical  Anesthesia Plan  ASA: III  Anesthesia Plan: General   Post-op Pain Management:    Induction: Intravenous  Airway Management Planned: LMA  Additional Equipment:   Intra-op Plan:   Post-operative Plan: Extubation in OR  Informed Consent: I have reviewed the patients History and Physical, chart, labs and discussed the procedure including the risks, benefits and alternatives for the proposed anesthesia with the patient or authorized representative who has indicated his/her understanding and acceptance.   Dental advisory given  Plan Discussed with: CRNA and Surgeon  Anesthesia Plan Comments: (Will use Ketamine intraop given chronic pain and AKA surgery  Will not accept blood products, Hgb is 12,  family,patient and surgeon aware and accept risks of anemia if develops)      Anesthesia Quick Evaluation

## 2015-12-15 NOTE — Anesthesia Postprocedure Evaluation (Signed)
Anesthesia Post Note  Patient: Deborah Hendricks  Procedure(s) Performed: Procedure(s) (LRB): LEFT ABOVE KNEE AMPUTATION (Left)  Patient location during evaluation: PACU Anesthesia Type: General Level of consciousness: awake and alert Pain management: pain level controlled Vital Signs Assessment: post-procedure vital signs reviewed and stable Respiratory status: spontaneous breathing, nonlabored ventilation, respiratory function stable and patient connected to nasal cannula oxygen Cardiovascular status: blood pressure returned to baseline and stable Postop Assessment: no signs of nausea or vomiting Anesthetic complications: no    Last Vitals:  Filed Vitals:   12/15/15 1245 12/15/15 1300  BP: 154/64 170/71  Pulse: 63 63  Temp:    Resp: 8 9    Last Pain: There were no vitals filed for this visit.               Zenaida Deed

## 2015-12-15 NOTE — Consult Note (Signed)
Deborah Hendricks 12/15/2015 Rexene Agent Requesting Physician:  Trula Slade MD  Reason for Consult:  Comanagement of ESRD, s/p L AKA, PVD HPI:  27F ESRD [place] with severe PVD and threat of limb loss underwent nonurgent L AKA this AM.  Last HD 1/31 achieving EDW, full time.  K initially 6.0 this AM, appears was treated medically and upon repeat was 3.0.    Pt is Jehovah's Witness and does not rec blood products.    Pt s/p 1st stage BVT on R 1/13 with [facility]VS.    Postoperative seen, pt groggy, states in pain.      Filed Weights   12/15/15 0907  Weight: 66.679 kg (147 lb)      ROS Balance of 12 systems is negative w/ exceptions as above  Outpt HD Orders Unit: [facility] Days: THS Time: 4h Dialyzer: F160 EDW: 64kg K/Ca: 2/2.25 Access: TDC Needle Size: na BFR/DFR: 400/a1.5 UF Proflie: profile 4 VDRA: calcitriol 1.33mcg qTx EPO: on hold curently, since 1/10, Hb 11.8 most recent IV Fe: On 100mg  IV x10 bolus Heparin: 5000 IU IVB qTx Most Recent Phos / PTH: 6.6 / 432 Most Recent eKT/V: 1.18 Treatment Adherence: excellent  PMH  Past Medical History  Diagnosis Date  . Diabetes mellitus without complication ([place])   . Hypertension   . Anemia   . Critical lower limb ischemia   . Stroke ([place]) 04/2015    unable to stand .  can have diffuuclty with speech at times  . Seizures ([place]) 04/2015    since   . Kidney disease     ESRD  . Depression   . Anxiety   . Constipation   . Memory loss     since stroke 04/2015  . Peripheral vascular disease ([place])   . CHF (congestive heart failure) ([facility])    PSH  Past Surgical History  Procedure Laterality Date  . Toe amputation      R foot  . Abdominal hysterectomy    . Tonsillectomy    . Bascilic vein transposition Right 11/25/2015    Procedure: Right Arm FIRST STAGE BASILIC VEIN TRANSPOSITION;  Surgeon: Serafina Mitchell, MD;  Location: [facility];  Service: Vascular;  Laterality: Right;  . Peripheral vascular catheterization Bilateral 11/28/2015     Procedure: Lower Extremity Angiography;  Surgeon: Lorretta Harp, MD;  Location: [facility];  Service: Cardiovascular;  Laterality: Bilateral;  . Peripheral vascular catheterization N/A 11/28/2015    Procedure: Abdominal Aortogram;  Surgeon: Lorretta Harp, MD;  Location: [facility];  Service: Cardiovascular;  Laterality: N/A;   FH  Family History  Problem Relation Age of Onset  . Asthma Mother   . Cancer Sister    SH  reports that she quit smoking about 2 years ago. She has never used smokeless tobacco. She reports that she does not drink alcohol or use illicit drugs. Allergies  Allergies  Allergen Reactions  . Amoxicillin     Per MAR  . Latex     Per MAR  . Sulfa Antibiotics     Per MAR  . Penicillins Hives, Itching and Rash    Has patient had a PCN reaction causing immediate rash, facial/tongue/throat swelling, SOB or lightheadedness with hypotension: Yes Has patient had a PCN reaction causing severe rash involving mucus membranes or skin necrosis:  Has patient had a PCN reaction that required hospitalization  Has patient had a PCN reaction occurring within the last 10 years:  If all of  the above answers are "NO", then may proceed with Cephalosporin use.   Home medications Prior to Admission medications   Medication Sig Start Date End Date Taking? Authorizing Provider  apixaban (ELIQUIS) 5 MG TABS tablet Take 5 mg by mouth daily.   Yes Historical Provider, MD  aspirin EC 325 MG tablet Take 325 mg by mouth daily.   Yes Historical Provider, MD  carvedilol (COREG) 12.5 MG tablet Take 12.5 mg by mouth every Tuesday, Thursday, and Saturday at 6 PM.   Yes Historical Provider, MD  esomeprazole (NEXIUM) 20 MG capsule Take 20 mg by mouth daily at 12 noon.   Yes Historical Provider, MD  gabapentin (NEURONTIN) 300 MG capsule Take 300 mg by mouth 3 (three) times daily.   Yes Historical Provider, MD  insulin aspart (NOVOLOG) 100 UNIT/ML injection Inject 2-10 Units into the  skin 3 (three) times daily before meals. 151-200 = 2 units, 201-250 = 3 units, 251-300 = 4 unit, 301-350 = 6 units, 351-400 = 8 units, 401-999 = 10 units   Yes Historical Provider, MD  insulin detemir (LEVEMIR) 100 UNIT/ML injection Inject 18 Units into the skin daily.   Yes Historical Provider, MD  lactulose (CHRONULAC) 10 GM/15ML solution Take 20 g by mouth 2 (two) times daily as needed for mild constipation.   Yes Historical Provider, MD  LORazepam (ATIVAN) 0.5 MG tablet Take 0.5 mg by mouth at bedtime.   Yes Historical Provider, MD  magnesium oxide (MAG-OX) 400 MG tablet Take 400 mg by mouth daily.   Yes Historical Provider, MD  Melatonin 3 MG CAPS Take 3 mg by mouth.   Yes Historical Provider, MD  mirtazapine (REMERON) 15 MG tablet Take 15 mg by mouth at bedtime.   Yes Historical Provider, MD  ondansetron (ZOFRAN) 4 MG tablet Take 4 mg by mouth every 8 (eight) hours as needed for nausea or vomiting.   Yes Historical Provider, MD  carvedilol (COREG) 12.5 MG tablet Take 12.5 mg by mouth See admin instructions. Take 12.5 mg every sun, mon, wed, and fri twice daily    Historical Provider, MD  glucagon 1 MG injection Inject 1 mg into the vein once as needed (hypoglycemia).    Historical Provider, MD  Nutritional Supplements (FEEDING SUPPLEMENT, NEPRO CARB STEADY,) LIQD Take 237 mLs by mouth 2 (two) times daily between meals.    Historical Provider, MD  oxyCODONE-acetaminophen (PERCOCET/ROXICET) 5-325 MG tablet Take 1-2 tablets by mouth every 4 (four) hours as needed for moderate pain or severe pain. 11/25/15   Ulyses Amor, PA-C    Current Medications Scheduled Meds: . HYDROmorphone      . HYDROmorphone      . oxyCODONE       Continuous Infusions: . sodium chloride     PRN Meds:.  CBC  Recent Labs Lab 12/15/15 1003 12/15/15 1135  HGB 13.9 10.5*  HCT 41.0 0000000*   Basic Metabolic Panel  Recent Labs Lab 12/15/15 1003 12/15/15 1135  NA 135 140  K 6.0* 3.0*  GLUCOSE 118* 178*     Physical Exam  Blood pressure 187/74, pulse 58, temperature 97.5 F (36.4 C), temperature source Oral, resp. rate 9, height 5' (1.524 m), weight 66.679 kg (147 lb), SpO2 100 %. GEN: groggy ENT: NCAT EYES: EOMI CV: RRR, no rub PULM: CTAB ABD: s/nt/nd SKIN: L Fem TDC bandaged, clean [id number] dressigns notes   A 1. S/p L AKA 12/15/15 VVS [place] for PVD and nonhealing ulcers 2. ESRD on iHD THS [place]  [place] via L Fem TDC, maturing R BVT 1/13 3. HTN/Vol; BP and EDW appropriate in outpt setting 4. Anemia not on ESA at current time for Hb 12.0; Jehovah's Witness 5. Hyperkalemia 6. Hyperphosphatemia, uncontrolled 7. Hyperparathyroidism, Secondary 8. PVD 9. Hx/o multiple CVA 10. HTN 11. DM2  P 1. HD Tomorrow, and then on Saturday to get on schedule 2. Hold heparin for first HD Tx 3. Full labs now, eval K 4. Resume home BP meds when able to take PO 5. Stop Maalox PRN 6. Will need new EDW upon discharge  Lehmkuhl Grippe MD 12/15/2015, 2:03 PM

## 2015-12-15 NOTE — Transfer of Care (Signed)
Immediate Anesthesia Transfer of Care Note  Patient: Deborah Hendricks  Procedure(s) Performed: Procedure(s): LEFT ABOVE KNEE AMPUTATION (Left)  Patient Location: PACU  Anesthesia Type:General  Level of Consciousness: awake, alert , patient cooperative and responds to stimulation  Airway & Oxygen Therapy: Patient Spontanous Breathing and Patient connected to nasal cannula oxygen  Post-op Assessment: Report given to RN, Post -op Vital signs reviewed and stable and Patient moving all extremities X 4  Post vital signs: Reviewed and stable  Last Vitals:  Filed Vitals:   12/15/15 0907  BP: 179/76  Pulse: 69  Temp: 36.3 C  Resp: 16    Complications: No apparent anesthesia complications

## 2015-12-15 NOTE — Op Note (Signed)
    Patient name: Deborah Hendricks MRN: [id number] DOB: Apr 13, 1946 Sex: female  12/15/2015 Pre-operative Diagnosis: Left foot ulcer Post-operative diagnosis:  Same Surgeon:  [name] Assistants:  Leontine Locket Procedure:   Left above-knee amputation  Anesthesia:  Gen. Blood Loss:  See anesthesia record Specimens:  Left leg  Findings:  Excellent bleeding and healthy muscle at the amputation site  Indications:  The patient developed ulcers on her first and fifth toe.  She underwent angiography by Dr. Gwenlyn Found.  She has unreconstructable disease.  She has worsening of her second toe with edema and blistering and early infection.  I discussed with the patient and her daughters that I feel amputation is needed to prevent infectious complications.  She is in full agreement.  She has not walked since last summer and therefore I think a above-knee amputation is most appropriate  Procedure:  The patient was identified in the holding area and taken to [place] [facility]  The patient was then placed supine on the table. general anesthesia was administered.  The patient was prepped and draped in the usual sterile fashion.  A time out was called and antibiotics were administered.  A fishmouth incision was made above the knee on the left.  Cautery was used to divide the subcutaneous tissue.  The muscle wasn't divided with cautery.  I isolated the neurovascular bundle and occluded it with a Kelly clamp.  The bone was circumferentially dissected free.  A periosteal elevator was used to elevate the periosteum.  A Gigli saw was used to transect the bone beveling the anterior surface.  The remaining muscle was then divided with the amputation knife.  I then individually ligated the neurovascular bundle, proximal to the cut edge of the femur.  Multiple 2-0 silks sutures were required for hemostasis.  Ultimately once hemostasis was satisfactory the wound was copiously irrigated.  I did have to shorten the femur an additional 2  cm in order to make sure the incision closure was not too tight.  Once I was satisfied with the wound bed, the fascia was reapproximated with 2-0 Vicryl followed by skin staples on the skin.  Sterile dressings were applied.   Disposition:  To PACU in stable condition.   Theotis Burrow, M.D. [facility] Office: [phone] Pager:  [phone]

## 2015-12-15 NOTE — Progress Notes (Signed)
Pharmacy Consult for antibiotic renal dose adjustment. 70 yo F s/p L AKA. She is ESRD on HD TTS. Wt 66.7 She was given vanc 1 gm preop at 1040am.  This is ~ 15 mg/kg.  Plan: DC vanc 1 gm q12 x 2 doses from order set - vanc 500 mg IV x 1 dose 2/3 at 1000 am to complete course Pharmacy to sign off Thanks Eudelia Bunch, Pharm.D. [phone] 12/15/2015 3:14 PM

## 2015-12-15 NOTE — Interval H&P Note (Signed)
History and Physical Interval Note:  12/15/2015 9:57 AM  Deborah Hendricks  has presented today for surgery, with the diagnosis of PVD with nonhealing wound left foot toes  I70.245  The various methods of treatment have been discussed with the patient and family. After consideration of risks, benefits and other options for treatment, the patient has consented to  Procedure(s): AMPUTATION ABOVE KNEE-LEFT (Left) as a surgical intervention .  The patient's history has been reviewed, patient examined, no change in status, stable for surgery.  I have reviewed the patient's chart and labs.  Questions were answered to the patient's satisfaction.     [name]

## 2015-12-15 NOTE — Anesthesia Procedure Notes (Signed)
Procedure Name: LMA Insertion Performed by: Tressia Miners LEFFEW Pre-anesthesia Checklist: Patient identified, Emergency Drugs available, Suction available, Timeout performed and Patient being monitored Patient Re-evaluated:Patient Re-evaluated prior to inductionOxygen Delivery Method: Circle system utilized Preoxygenation: Pre-oxygenation with 100% oxygen Intubation Type: IV induction Ventilation: Mask ventilation without difficulty LMA: LMA inserted LMA Size: 4.0 Number of attempts: 1 Tube secured with: Tape Dental Injury: Teeth and Oropharynx as per pre-operative assessment

## 2015-12-15 NOTE — Progress Notes (Signed)
Lab unable to obtain blood.  I attempted one IV stick without success.  Spoke with Dr Jillyn Hidden and was given the OK to use dialysis catheter in right thigh.

## 2015-12-16 ENCOUNTER — Encounter (HOSPITAL_COMMUNITY): Payer: Self-pay | Admitting: Surgery

## 2015-12-16 LAB — BASIC METABOLIC PANEL
Anion gap: 16 — ABNORMAL HIGH (ref 5–15)
BUN: 60 mg/dL — AB (ref 6–20)
CHLORIDE: 98 mmol/L — AB (ref 101–111)
CO2: 24 mmol/L (ref 22–32)
Calcium: 9.3 mg/dL (ref 8.9–10.3)
Creatinine, Ser: 6.99 mg/dL — ABNORMAL HIGH (ref 0.44–1.00)
GFR calc non Af Amer: 5 mL/min — ABNORMAL LOW (ref >60)
GFR, EST AFRICAN AMERICAN: 6 mL/min — AB (ref >60)
Glucose, Bld: 125 mg/dL — ABNORMAL HIGH (ref 65–99)
POTASSIUM: 5.6 mmol/L — AB (ref 3.5–5.1)
SODIUM: 138 mmol/L (ref 135–145)

## 2015-12-16 LAB — GLUCOSE, CAPILLARY
GLUCOSE-CAPILLARY: 107 mg/dL — AB (ref 65–99)
GLUCOSE-CAPILLARY: 103 mg/dL — AB (ref 65–99)
GLUCOSE-CAPILLARY: 107 mg/dL — AB (ref 65–99)
Glucose-Capillary: 154 mg/dL — ABNORMAL HIGH (ref 65–99)

## 2015-12-16 LAB — CBC
HEMATOCRIT: 33.9 % — AB (ref 36.0–46.0)
Hemoglobin: 11 g/dL — ABNORMAL LOW (ref 12.0–15.0)
MCH: 30.1 pg (ref 26.0–34.0)
MCHC: 32.4 g/dL (ref 30.0–36.0)
MCV: 92.6 fL (ref 78.0–100.0)
Platelets: 226 10*3/uL (ref 150–400)
RBC: 3.66 MIL/uL — AB (ref 3.87–5.11)
RDW: 16.1 % — ABNORMAL HIGH (ref 11.5–15.5)
WBC: 9.4 10*3/uL (ref 4.0–10.5)

## 2015-12-16 MED ORDER — OXYCODONE HCL 5 MG PO TABS
ORAL_TABLET | ORAL | Status: AC
  Administered 2015-12-16: 5 mg via ORAL
  Filled 2015-12-16: qty 1

## 2015-12-16 MED ORDER — HYDROMORPHONE HCL 1 MG/ML IJ SOLN
0.5000 mg | Freq: Once | INTRAMUSCULAR | Status: AC
Start: 2015-12-16 — End: 2015-12-16
  Administered 2015-12-16: 0.5 mg via INTRAVENOUS

## 2015-12-16 MED ORDER — HEPARIN SODIUM (PORCINE) 1000 UNIT/ML DIALYSIS: 1000.0000 [IU] | INTRAMUSCULAR | Status: DC | PRN

## 2015-12-16 MED ORDER — SODIUM CHLORIDE 0.9 % IV SOLN: 100.0000 mL | INTRAVENOUS | Status: DC | PRN

## 2015-12-16 MED ORDER — ALTEPLASE 2 MG IJ SOLR: 2.0000 mg | Freq: Once | INTRAMUSCULAR | Status: DC | PRN

## 2015-12-16 MED ORDER — CALCITRIOL 0.5 MCG PO CAPS
1.5000 ug | ORAL_CAPSULE | ORAL | Status: DC
  Administered 2015-12-16 – 2015-12-21 (×2): 1.5 ug via ORAL
  Filled 2015-12-16 (×3): qty 3

## 2015-12-16 MED ORDER — SEVELAMER CARBONATE 800 MG PO TABS
2400.0000 mg | ORAL_TABLET | Freq: Three times a day (TID) | ORAL | Status: DC
  Administered 2015-12-16 – 2015-12-21 (×9): 2400 mg via ORAL
  Filled 2015-12-16 (×13): qty 3

## 2015-12-16 MED ORDER — CARVEDILOL 12.5 MG PO TABS
12.5000 mg | ORAL_TABLET | ORAL | Status: DC
  Administered 2015-12-17: 12.5 mg via ORAL
  Filled 2015-12-16 (×5): qty 1

## 2015-12-16 MED ORDER — CARVEDILOL 12.5 MG PO TABS
12.5000 mg | ORAL_TABLET | ORAL | Status: DC
  Administered 2015-12-16 – 2015-12-21 (×4): 12.5 mg via ORAL
  Filled 2015-12-16 (×2): qty 1

## 2015-12-16 MED ORDER — HYDROMORPHONE HCL 1 MG/ML IJ SOLN
INTRAMUSCULAR | Status: AC
  Administered 2015-12-16: 0.5 mg via INTRAVENOUS
  Filled 2015-12-16: qty 1

## 2015-12-16 MED ORDER — OXYCODONE-ACETAMINOPHEN 5-325 MG PO TABS: 1.0000 | ORAL_TABLET | ORAL | Status: DC | PRN

## 2015-12-16 NOTE — Progress Notes (Signed)
CSW informed of pt DC- anticipate leaving tomorrow (2/4) after dressing change- facility aware and able to accept back.  Will need DC summary to reflect DC date.  Pt and dtr made aware of DC back to facility- will need PTAR transport back  Domenica Reamer, [place] Social Worker [phone]

## 2015-12-16 NOTE — Clinical Social Work Note (Signed)
Clinical Social Work Assessment  Patient Details  Name: Deborah Hendricks MRN: [id number] Date of Birth: 1946/03/18  Date of referral:  12/16/15               Reason for consult:  Facility Placement                Permission sought to share information with:  Family Supports, Chartered certified accountant granted to share information::  Yes, Verbal Permission Granted  Name::     Engineer, drilling::  [facility]  Relationship::  dtr  Contact Information:     Housing/Transportation Living arrangements for the past 2 months:  [place] of Information:  Patient Patient Interpreter Needed:  None Criminal Activity/Legal Involvement Pertinent to Current Situation/Hospitalization:  No - Comment as needed Significant Relationships:  Adult Children Lives with:  Facility Resident Do you feel safe going back to the place where you live?  Yes Need for family participation in patient care:  No (Coment)  Care giving concerns:  None- pt lives at [facility]   Social Worker assessment / plan:  CSW spoke with pt and dtr about plan when pt is stable for DC- discussed if pt plan was to return to [place]  Employment status:  Retired Forensic scientist:  Medicare PT Recommendations:  Not assessed at this time Information / Referral to community resources:  [place]  Patient/Family's Response to care:  Pt and dtr are agreeable to return to [facility] [facility] but hopeful pt will be able to get therapy.   Patient/Family's Understanding of and Emotional Response to Diagnosis, Current Treatment, and Prognosis:  No questions or concerns- pt is very involved in her care and wants to make sure she gets copy of DC information so she can check facility adherence.  Emotional Assessment Appearance:  Appears stated age Attitude/Demeanor/Rapport:    Affect (typically observed):  Appropriate Orientation:  Oriented to Self, Oriented to Place, Oriented to  Time, Oriented  to Situation Alcohol / Substance use:  Not Applicable Psych involvement (Current and /or in the community):  No (Comment)  Discharge Needs  Concerns to be addressed:  Care Coordination Readmission within the last 30 days:  No Current discharge risk:  None Barriers to Discharge:  Continued Medical Work up   [employer], LCSW 12/16/2015, 4:57 PM

## 2015-12-16 NOTE — Procedures (Signed)
Patient was seen on dialysis and the procedure was supervised.  BFR 350  Via PC BP is  83/42.   Patient appears to be tolerating treatment well  Deborah Hendricks A 12/16/2015

## 2015-12-16 NOTE — NC FL2 (Signed)
[place] LEVEL OF CARE SCREENING TOOL     IDENTIFICATION  Patient Name: Deborah Hendricks Birthdate: [birth year] Sex: female Admission Date (Current Location): 12/15/2015  [facility] and [place] Number:  Herbalist and Address:  [facility] [place] [address], [place], [place] [address]      Provider Number: [id number]  Attending Physician Name and Address:  Serafina Mitchell, MD  Relative Name and Phone Number:       Current Level of Care: Hospital Recommended Level of Care: [place] Prior Approval Number:    Date Approved/Denied:   PASRR Number:    Discharge Plan: SNF    Current Diagnoses: Patient Active Problem List   Diagnosis Date Noted  . PAD (peripheral artery disease) ([place]) 12/15/2015  . Critical lower limb ischemia 11/16/2015  . Type 2 diabetes mellitus with left diabetic foot ulcer ([place]) 10/21/2015  . Type II diabetes mellitus with neurological manifestations ([place]) 10/21/2015    Orientation RESPIRATION BLADDER Height & Weight     Self, Time, Situation, Place  Normal Incontinent Weight: 134 lb 14.7 oz (61.2 kg) (bedscale) Height:  5' (152.4 cm)  BEHAVIORAL SYMPTOMS/MOOD NEUROLOGICAL BOWEL NUTRITION STATUS      Continent Diet  AMBULATORY STATUS COMMUNICATION OF NEEDS Skin   Extensive Assist Verbally Surgical wounds                       Personal Care Assistance Level of Assistance  Bathing, Dressing Bathing Assistance: Maximum assistance   Dressing Assistance: Maximum assistance     Functional Limitations Info             SPECIAL CARE FACTORS FREQUENCY                       Contractures      Additional Factors Info  Code Status, Allergies, Psychotropic Code Status Info: FULL Allergies Info: Amoxicillin, Latex, Sulfa Antibiotics, Penicillins Psychotropic Info: ativan         Current Medications (12/16/2015):  This is the current hospital active medication list Current  Facility-Administered Medications  Medication Dose Route Frequency Provider Last Rate Last Dose  . 0.9 %  sodium chloride infusion  250 mL Intravenous PRN Samantha J Rhyne, PA-C      . 0.9 %  sodium chloride infusion  100 mL Intravenous PRN Rexene Agent, MD      . 0.9 %  sodium chloride infusion  100 mL Intravenous PRN Rexene Agent, MD      . acetaminophen (TYLENOL) tablet 325-650 mg  325-650 mg Oral Q4H PRN Hulen Shouts Rhyne, PA-C       Or  . acetaminophen (TYLENOL) suppository 325-650 mg  325-650 mg Rectal Q4H PRN Samantha J Rhyne, PA-C      . alteplase (CATHFLO ACTIVASE) injection 2 mg  2 mg Intracatheter Once PRN Rexene Agent, MD      . alum & mag hydroxide-simeth (MAALOX/MYLANTA) 200-200-20 MG/5ML suspension 15-30 mL  15-30 mL Oral Q2H PRN Samantha J Rhyne, PA-C      . aspirin EC tablet 325 mg  325 mg Oral Daily Samantha J Rhyne, PA-C      . bisacodyl (DULCOLAX) suppository 10 mg  10 mg Rectal Daily PRN Samantha J Rhyne, PA-C      . calcitRIOL (ROCALTROL) capsule 1.5 mcg  1.5 mcg Oral Q M,W,F Corliss Parish, MD      . carvedilol (COREG) tablet 12.5  mg  12.5 mg Oral 2 times per day on Sun Mon Wed Fri Corliss Parish, MD       And  . Derrill Memo ON 12/17/2015] carvedilol (COREG) tablet 12.5 mg  12.5 mg Oral Once per day on Tue Thu Sat Corliss Parish, MD      . docusate sodium (COLACE) capsule 100 mg  100 mg Oral Daily Samantha J Rhyne, PA-C      . feeding supplement (NEPRO CARB STEADY) liquid 237 mL  237 mL Oral BID BM Samantha J Rhyne, PA-C   237 mL at 12/15/15 1515  . gabapentin (NEURONTIN) capsule 300 mg  300 mg Oral TID Hulen Shouts Rhyne, PA-C   300 mg at 12/15/15 2121  . glucagon (human recombinant) (GLUCAGEN) injection 1 mg  1 mg Intravenous Once PRN Samantha J Rhyne, PA-C      . guaiFENesin-dextromethorphan (ROBITUSSIN DM) 100-10 MG/5ML syrup 15 mL  15 mL Oral Q4H PRN Samantha J Rhyne, PA-C      . heparin injection 1,000 Units  1,000 Units Dialysis PRN Rexene Agent, MD       . hydrALAZINE (APRESOLINE) injection 5 mg  5 mg Intravenous Q20 Min PRN Samantha J Rhyne, PA-C      . labetalol (NORMODYNE,TRANDATE) injection 10 mg  10 mg Intravenous Q10 min PRN Hulen Shouts Rhyne, PA-C   10 mg at 12/15/15 2127  . lactulose (CHRONULAC) 10 GM/15ML solution 20 g  20 g Oral BID PRN Hulen Shouts Rhyne, PA-C      . LORazepam (ATIVAN) tablet 0.5 mg  0.5 mg Oral QHS Samantha J Rhyne, PA-C   0.5 mg at 12/15/15 2121  . metoprolol (LOPRESSOR) injection 2-5 mg  2-5 mg Intravenous Q2H PRN Samantha J Rhyne, PA-C      . mirtazapine (REMERON) tablet 15 mg  15 mg Oral QHS Samantha J Rhyne, PA-C   15 mg at 12/15/15 2142  . morphine 2 MG/ML injection 2-4 mg  2-4 mg Intravenous Q2H PRN Hulen Shouts Rhyne, PA-C   2 mg at 12/15/15 2252  . ondansetron (ZOFRAN) injection 4 mg  4 mg Intravenous Q6H PRN Samantha J Rhyne, PA-C      . ondansetron (ZOFRAN) tablet 4 mg  4 mg Oral Q8H PRN Samantha J Rhyne, PA-C      . oxyCODONE (Oxy IR/ROXICODONE) 5 MG immediate release tablet           . oxyCODONE (Oxy IR/ROXICODONE) immediate release tablet 5-10 mg  5-10 mg Oral Q6H PRN Hulen Shouts Rhyne, PA-C   5 mg at 12/15/15 2142  . oxyCODONE-acetaminophen (PERCOCET/ROXICET) 5-325 MG per tablet 1-2 tablet  1-2 tablet Oral Q4H PRN Gabriel Earing, PA-C   2 tablet at 12/16/15 0414  . pantoprazole (PROTONIX) EC tablet 40 mg  40 mg Oral Daily Samantha J Rhyne, PA-C   40 mg at 12/15/15 1552  . phenol (CHLORASEPTIC) mouth spray 1 spray  1 spray Mouth/Throat PRN Samantha J Rhyne, PA-C      . sevelamer carbonate (RENVELA) tablet 2,400 mg  2,400 mg Oral TID WC Corliss Parish, MD      . sodium chloride flush (NS) 0.9 % injection 3 mL  3 mL Intravenous Q12H Hulen Shouts Rhyne, PA-C   3 mL at 12/15/15 2129  . sodium chloride flush (NS) 0.9 % injection 3 mL  3 mL Intravenous PRN Samantha J Rhyne, PA-C      . vancomycin (VANCOCIN) 500 mg in sodium chloride 0.9 % 100 mL IVPB  500  mg Intravenous Once Eudelia Bunch, [facility]          Discharge Medications: Please see discharge summary for a list of discharge medications.  Relevant Imaging Results:  Relevant Lab Results:   Additional Information SS#: [id number]  Cranford Mon, [place]

## 2015-12-16 NOTE — Discharge Summary (Signed)
[facility] Discharge Summary   Patient ID:  Deborah Hendricks MRN: [id number] DOB/AGE: 1946/06/28 70 y.o.  Admit date: 12/15/2015 Discharge date: 12/16/2015 Date of Surgery: Surgeon: Surgeon(s): Serafina Mitchell, MD  Admission Diagnosis: PVD  Discharge Diagnoses:  PVD  Secondary Diagnoses: Past Medical History  Diagnosis Date  . Diabetes mellitus without complication ([place])   . Hypertension   . Anemia   . Critical lower limb ischemia   . Stroke ([place]) 04/2015    unable to stand .  can have diffuuclty with speech at times  . Seizures ([place]) 04/2015    since   . Kidney disease     ESRD  . Depression   . Anxiety   . Constipation   . Memory loss     since stroke 04/2015  . Peripheral vascular disease ([place])   . CHF (congestive heart failure) ([place])     Procedure(s): LEFT ABOVE KNEE AMPUTATION  Discharged Condition: good  HPI: the patient is here today for evaluation of a left foot ulcer. She is well known to me having undergone first stage right basilic vein transposition approximately 2 weeks ago. She is a hemodialysis patient on Tuesday Thursday Saturday. She has had a wound on her left first and fifth toe for a few months. She underwent angiography by Dr. Gwenlyn Found and does not have any options for revascularization. Her wounds are getting worse and she was sent over for discussions of amputation. The patient has not walked since last summer  The patient suffers from diabetes which has not been well controlled. She is medically managed for hypertension. She has a history of stroke. She has been on Coumadin in the past but is now on Plavix.    Hospital Course:  Deborah Hendricks is a 70 y.o. female is S/P  LEFT ABOVE KNEE AMPUTATION  POD#1 She had HD today under the care of Dr. Moshe Cipro  Patient just returned from HD  Left AKA dressing clean and dry Patient comfortable  S/P Left AKA Plan for dressing change tomorrow She will return to SNF No CIR  consult she is non ambulatory  Consults:  Treatment Team:  Corliss Parish, MD  Significant Diagnostic Studies: CBC Lab Results  Component Value Date   WBC 9.4 12/16/2015   HGB 11.0* 12/16/2015   HCT 33.9* 12/16/2015   MCV 92.6 12/16/2015   PLT 226 12/16/2015    BMET    Component Value Date/Time   NA 138 12/16/2015 0340   K 5.6* 12/16/2015 0340   CL 98* 12/16/2015 0340   CO2 24 12/16/2015 0340   GLUCOSE 125* 12/16/2015 0340   BUN 60* 12/16/2015 0340   CREATININE 6.99* 12/16/2015 0340   CALCIUM 9.3 12/16/2015 0340   GFRNONAA 5* 12/16/2015 0340   GFRAA 6* 12/16/2015 0340   COAG No results found for: INR, PROTIME   Disposition:  Discharge to :Skilled nursing facility Discharge Instructions    Activity as tolerated - No restrictions    Complete by:  As directed      Call MD for:  redness, tenderness, or signs of infection (pain, swelling, bleeding, redness, odor or green/yellow discharge around incision site)    Complete by:  As directed      Call MD for:  severe or increased pain, loss or decreased feeling  in affected limb(s)    Complete by:  As directed      Call MD for:  temperature >100.5    Complete by:  As directed  Discharge instructions    Complete by:  As directed   May shower dry dressing to left stump until no drainage.     Resume previous diet    Complete by:  As directed             Medication List    TAKE these medications        aspirin EC 325 MG tablet  Take 325 mg by mouth daily.     carvedilol 12.5 MG tablet  Commonly known as:  COREG  Take 12.5 mg by mouth every Tuesday, Thursday, and Saturday at 6 PM.     carvedilol 12.5 MG tablet  Commonly known as:  COREG  Take 12.5 mg by mouth See admin instructions. Take 12.5 mg every sun, mon, wed, and fri twice daily     ELIQUIS 5 MG Tabs tablet  Generic drug:  apixaban  Take 5 mg by mouth daily.     esomeprazole 20 MG capsule  Commonly known as:  NEXIUM  Take 20 mg by mouth  daily at 12 noon.     feeding supplement (NEPRO CARB STEADY) Liqd  Take 237 mLs by mouth 2 (two) times daily between meals.     gabapentin 300 MG capsule  Commonly known as:  NEURONTIN  Take 300 mg by mouth 3 (three) times daily.     glucagon 1 MG injection  Inject 1 mg into the vein once as needed (hypoglycemia).     insulin aspart 100 UNIT/ML injection  Commonly known as:  novoLOG  Inject 2-10 Units into the skin 3 (three) times daily before meals. 151-200 = 2 units, 201-250 = 3 units, 251-300 = 4 unit, 301-350 = 6 units, 351-400 = 8 units, 401-999 = 10 units     insulin detemir 100 UNIT/ML injection  Commonly known as:  LEVEMIR  Inject 18 Units into the skin daily.     lactulose 10 GM/15ML solution  Commonly known as:  CHRONULAC  Take 20 g by mouth 2 (two) times daily as needed for mild constipation.     LORazepam 0.5 MG tablet  Commonly known as:  ATIVAN  Take 0.5 mg by mouth at bedtime.     magnesium oxide 400 MG tablet  Commonly known as:  MAG-OX  Take 400 mg by mouth daily.     Melatonin 3 MG Caps  Take 3 mg by mouth.     mirtazapine 15 MG tablet  Commonly known as:  REMERON  Take 15 mg by mouth at bedtime.     ondansetron 4 MG tablet  Commonly known as:  ZOFRAN  Take 4 mg by mouth every 8 (eight) hours as needed for nausea or vomiting.     oxyCODONE-acetaminophen 5-325 MG tablet  Commonly known as:  PERCOCET/ROXICET  Take 1-2 tablets by mouth every 4 (four) hours as needed for moderate pain or severe pain.     oxyCODONE-acetaminophen 5-325 MG tablet  Commonly known as:  PERCOCET/ROXICET  Take 1-2 tablets by mouth every 4 (four) hours as needed for moderate pain or severe pain.       Verbal and written Discharge instructions given to the patient. Wound care per Discharge AVS     Follow-up Information    Follow up with [name], MD In 4 weeks.   Specialties:  Vascular Surgery, Cardiology   Why:  Office will call you to arrange your appt (sent)    Contact information:   [address] [place] [address] [phone]  SignedRoxy Horseman 12/16/2015, 3:26 PM  Dialysis today Dressing dry Pain controlled Remove dressing tomorrow. Can return to [facility] When ready  WElls Mccall Will

## 2015-12-16 NOTE — Progress Notes (Signed)
      Patient just returned from HD  Left AKA dressing clean and dry Patient comfortable  S/P Left AKA Plan for dressing change tomorrow She will return to SNF No CIR consult she is non ambulatory  Hyland Mollenkopf MAUREEN PA-C

## 2015-12-16 NOTE — Progress Notes (Signed)
Subjective:  Tmax 99.2- seen on HD- sleeping but arousable- when wakes is hurting  Objective Vital signs in last 24 hours: Filed Vitals:   12/16/15 0745 12/16/15 0800 12/16/15 0828 12/16/15 0900  BP: 88/36 93/40 96/45  94/48  Pulse: 52 55 81 77  Temp:      TempSrc:      Resp: 16 11 12 12   Height:      Weight:      SpO2:       Weight change:   Intake/Output Summary (Last 24 hours) at 12/16/15 0931 Last data filed at 12/15/15 2201  Gross per 24 hour  Intake    400 ml  Output    200 ml  Net    200 ml   Outpt HD Orders Unit: [facility] Days: [facility] Time: 4h Dialyzer: F160 EDW: 64kg K/Ca: 2/2.25 Access: TDC Needle Size: na BFR/DFR: 400/a1.5 UF Proflie: profile 4 VDRA: calcitriol 1.51mcg qTx EPO: on hold curently, since 1/10, Hb 11.8 most recent IV Fe: On 100mg  IV x10 bolus Heparin: 5000 IU IVB qTx Most Recent Phos / PTH: 6.6 / 432 Most Recent eKT/V: 1.18 Treatment Adherence: excellent  Assessment/ Plan: [name] who was admitted on 12/15/2015 with limb ischemia- s/p AKA  Assessment/Plan: 1. PAD- POD #1 AKA- in pain- per vasc  2. ESRD - normally TTS- will do Fri and Sat to get on schedule  3. Anemia- Jehovahs witness- no blood products- ESA has been on hold- will likely need to resume with surgery 4. Secondary hyperparathyroidism- cannot seem to find out what binder is- since calcium is up will do renvela  continue calcitriol  5. HTN/volume- if anything BP is low right now- pain meds ? On coreg- will give parameter  6. Hyperkalemia- on HD  Deborah Hendricks A    Labs: Basic Metabolic Panel:  Recent Labs Lab 12/15/15 1135 12/15/15 1529 12/16/15 0340  NA 140 139 138  K 3.0* 4.6 5.6*  CL  --  99* 98*  CO2  --  26 24  GLUCOSE 178* 124* 125*  BUN  --  52* 60*  CREATININE  --  6.34* 6.99*  CALCIUM  --  9.7 9.3   Liver Function Tests: No results for input(s): AST, ALT, ALKPHOS, BILITOT, PROT, ALBUMIN in the last 168 hours. No results for  input(s): LIPASE, AMYLASE in the last 168 hours. No results for input(s): AMMONIA in the last 168 hours. CBC:  Recent Labs Lab 12/15/15 1003 12/15/15 1135 12/16/15 0340  WBC  --   --  9.4  HGB 13.9 10.5* 11.0*  HCT 41.0 31.0* 33.9*  MCV  --   --  92.6  PLT  --   --  226   Cardiac Enzymes: No results for input(s): CKTOTAL, CKMB, CKMBINDEX, TROPONINI in the last 168 hours. CBG:  Recent Labs Lab 12/15/15 0903 12/15/15 1317 12/15/15 1638 12/15/15 2120 12/16/15 0540  GLUCAP 116* 129* 127* 148* 154*    Iron Studies: No results for input(s): IRON, TIBC, TRANSFERRIN, FERRITIN in the last 72 hours. Studies/Results: No results found. Medications: Infusions:    Scheduled Medications: . aspirin EC  325 mg Oral Daily  . carvedilol  12.5 mg Oral 2 times per day on Sun Mon Wed Fri   And  . carvedilol  12.5 mg Oral Once per day on Tue Thu Sat  . docusate sodium  100 mg Oral Daily  . feeding supplement (NEPRO CARB STEADY)  237 mL Oral BID  BM  . gabapentin  300 mg Oral TID  . LORazepam  0.5 mg Oral QHS  . mirtazapine  15 mg Oral QHS  . oxyCODONE      . pantoprazole  40 mg Oral Daily  . sodium chloride flush  3 mL Intravenous Q12H  . vancomycin  500 mg Intravenous Once    have reviewed scheduled and prn medications.  Physical Exam: General: uncomfortable in HD  Heart: was slow- now back up- ?k? Lungs: mostly clear Abdomen: soft, non tender  Extremities: no edema Dialysis Access: IJ cath- also BVT - maturing    12/16/2015,9:31 AM  LOS: 1 day

## 2015-12-17 LAB — BASIC METABOLIC PANEL
ANION GAP: 13 (ref 5–15)
BUN: 24 mg/dL — ABNORMAL HIGH (ref 6–20)
CALCIUM: 8 mg/dL — AB (ref 8.9–10.3)
CO2: 26 mmol/L (ref 22–32)
CREATININE: 4.15 mg/dL — AB (ref 0.44–1.00)
Chloride: 96 mmol/L — ABNORMAL LOW (ref 101–111)
GFR calc Af Amer: 12 mL/min — ABNORMAL LOW (ref >60)
GFR, EST NON AFRICAN AMERICAN: 10 mL/min — AB (ref >60)
GLUCOSE: 98 mg/dL (ref 65–99)
Potassium: 4.3 mmol/L (ref 3.5–5.1)
Sodium: 135 mmol/L (ref 135–145)

## 2015-12-17 LAB — GLUCOSE, CAPILLARY
GLUCOSE-CAPILLARY: 81 mg/dL (ref 65–99)
GLUCOSE-CAPILLARY: 85 mg/dL (ref 65–99)
GLUCOSE-CAPILLARY: 87 mg/dL (ref 65–99)
GLUCOSE-CAPILLARY: 87 mg/dL (ref 65–99)
GLUCOSE-CAPILLARY: 106 mg/dL — AB (ref 65–99)
Glucose-Capillary: 53 mg/dL — ABNORMAL LOW (ref 65–99)
Glucose-Capillary: 60 mg/dL — ABNORMAL LOW (ref 65–99)

## 2015-12-17 LAB — CBC
HCT: 31.4 % — ABNORMAL LOW (ref 36.0–46.0)
HEMOGLOBIN: 10.1 g/dL — AB (ref 12.0–15.0)
MCH: 30.2 pg (ref 26.0–34.0)
MCHC: 32.2 g/dL (ref 30.0–36.0)
MCV: 94 fL (ref 78.0–100.0)
PLATELETS: 190 10*3/uL (ref 150–400)
RBC: 3.34 MIL/uL — ABNORMAL LOW (ref 3.87–5.11)
RDW: 16.6 % — AB (ref 11.5–15.5)
WBC: 10.6 10*3/uL — ABNORMAL HIGH (ref 4.0–10.5)

## 2015-12-17 NOTE — Progress Notes (Signed)
[facility] [place]  Subjective  - confused and lethargic   Objective 129/46 77 99.8 F (37.7 C) (Oral) 18 99%  Intake/Output Summary (Last 24 hours) at 12/17/15 1217 Last data filed at 12/16/15 1709  Gross per 24 hour  Intake      0 ml  Output      0 ml  Net      0 ml   Left AKA clean and healing Right femoral diatek clean  Assessment/Planning: Febrile last night with confusion today Will check blood cultures, CBC Delay d/c to SNF for now Try to do incentive spirometer if more awake Hold diabetes meds while not taking much PO and confused  Ruta Hinds 12/17/2015 12:17 PM --  Laboratory Lab Results:  Recent Labs  12/16/15 0340 12/17/15 0241  WBC 9.4 10.6*  HGB 11.0* 10.1*  HCT 33.9* 31.4*  PLT 226 190   BMET  Recent Labs  12/16/15 0340 12/17/15 0241  NA 138 135  K 5.6* 4.3  CL 98* 96*  CO2 24 26  GLUCOSE 125* 98  BUN 60* 24*  CREATININE 6.99* 4.15*  CALCIUM 9.3 8.0*

## 2015-12-17 NOTE — Progress Notes (Signed)
Per CN Md is aware of patient status re: low BP, will continue to keep UF off

## 2015-12-17 NOTE — Procedures (Signed)
Patient seen on Hemodialysis. QB 300, UF goal 1L Treatment adjusted as needed.  Elmarie Shiley MD [facility]. Office # [phone] Pager # [phone] 2:07 PM

## 2015-12-17 NOTE — Progress Notes (Signed)
Subjective:  Temp of 102 this AM- says she is hurting- doesn't remember going to HD yest- removed 2000- plan is to be discharged to NH today ?   Objective Vital signs in last 24 hours: Filed Vitals:   12/16/15 1356 12/16/15 2013 12/17/15 0453 12/17/15 0620  BP: 120/48 109/35 129/46   Pulse: 75 69 77   Temp: 99 F (37.2 C) 98.7 F (37.1 C) 102 F (38.9 C) 99.8 F (37.7 C)  TempSrc: Oral Oral Oral Oral  Resp: 17 18 18    Height:      Weight:      SpO2: 99% 97% 99%    Weight change:   Intake/Output Summary (Last 24 hours) at 12/17/15 [id number] Last data filed at 12/16/15 1709  Gross per 24 hour  Intake      0 ml  Output    200 ml  Net   -200 ml   Outpt HD Orders Unit: [facility] Days: [facility] Time: 4h Dialyzer: F160 EDW: 64kg K/Ca: 2/2.25 Access: TDC Needle Size: na BFR/DFR: 400/a1.5 UF Proflie: profile 4 VDRA: calcitriol 1.27mcg qTx EPO: on hold curently, since 1/10, Hb 11.8 most recent IV Fe: On 100mg  IV x10 bolus Heparin: 5000 IU IVB qTx Most Recent Phos / PTH: 6.6 / 432 Most Recent eKT/V: 1.18 Treatment Adherence: excellent  Assessment/ Plan: [name] who was admitted on 12/15/2015 with limb ischemia- s/p AKA  Assessment/Plan: 1. PAD- POD #2 AKA- in pain- per vasc  2. ESRD - normally TTS- done Fri for high K- will need short tx today Sat to get on schedule and to make sure she can last til Tuesday  3. Anemia- Jehovahs witness- no blood products- ESA has been on hold- will likely need to resume  4. Secondary hyperparathyroidism- cannot seem to find out what binder is- since calcium is up will do renvela  continue calcitriol  5. HTN/volume- if anything BP is low right now- pain meds ? On coreg- will give parameter - is better  6. Hyperkalemia- on HD  Jadelin Eng A    Labs: Basic Metabolic Panel:  Recent Labs Lab 12/15/15 1529 12/16/15 0340 12/17/15 0241  NA 139 138 135  K 4.6 5.6* 4.3  CL 99* 98* 96*  CO2 26 24 26   GLUCOSE 124*  125* 98  BUN 52* 60* 24*  CREATININE 6.34* 6.99* 4.15*  CALCIUM 9.7 9.3 8.0*   Liver Function Tests: No results for input(s): AST, ALT, ALKPHOS, BILITOT, PROT, ALBUMIN in the last 168 hours. No results for input(s): LIPASE, AMYLASE in the last 168 hours. No results for input(s): AMMONIA in the last 168 hours. CBC:  Recent Labs Lab 12/15/15 1135 12/16/15 0340 12/17/15 0241  WBC  --  9.4 10.6*  HGB 10.5* 11.0* 10.1*  HCT 31.0* 33.9* 31.4*  MCV  --  92.6 94.0  PLT  --  226 190   Cardiac Enzymes: No results for input(s): CKTOTAL, CKMB, CKMBINDEX, TROPONINI in the last 168 hours. CBG:  Recent Labs Lab 12/16/15 1335 12/16/15 1621 12/16/15 2113 12/17/15 0539 12/17/15 0603  GLUCAP 107* 103* 107* 81 85    Iron Studies: No results for input(s): IRON, TIBC, TRANSFERRIN, FERRITIN in the last 72 hours. Studies/Results: No results found. Medications: Infusions:    Scheduled Medications: . aspirin EC  325 mg Oral Daily  . calcitRIOL  1.5 mcg Oral Q M,W,F  . carvedilol  12.5 mg Oral 2 times per day on Sun Mon Wed Fri  And  . carvedilol  12.5 mg Oral Once per day on Tue Thu Sat  . docusate sodium  100 mg Oral Daily  . feeding supplement (NEPRO CARB STEADY)  237 mL Oral BID BM  . gabapentin  300 mg Oral TID  . LORazepam  0.5 mg Oral QHS  . mirtazapine  15 mg Oral QHS  . pantoprazole  40 mg Oral Daily  . sevelamer carbonate  2,400 mg Oral TID WC  . sodium chloride flush  3 mL Intravenous Q12H    have reviewed scheduled and prn medications.  Physical Exam: General: uncomfortable  Heart: RRR Lungs: mostly clear Abdomen: soft, non tender  Extremities: no edema Dialysis Access: IJ cath- also BVT - maturing    12/17/2015,8:32 AM  LOS: 2 days

## 2015-12-17 NOTE — Progress Notes (Signed)
Pt's sugar was re-checked and it was 60. Pt continues to be asymptomatic. More juice was given. Dialysis nurse Caryl Asp, RN) came to take pt down for dialysis. Joy, RN stated that they would re-check the pt's sugar in dialysis.   Grant Fontana RN, BSN

## 2015-12-17 NOTE — Progress Notes (Signed)
CSW spoke with Dr. Oneida Alar- states patient is not stable for d/c today and is unsure if ok for d/c over the weekend. Patient went to dialysis around 12:45 pm today. Discussed with daughter Dossie Arbour who agrees to d/c to [facility] when stable. Admissions for [facility] notified of above.  Lorie Phenix. Pauline Good, [place]

## 2015-12-17 NOTE — Progress Notes (Signed)
Pt's sugar 53. Pt is asymptomatic. Juice has been given. Will re-check sugar in 15 minutes.  Grant Fontana RN,BSN

## 2015-12-18 ENCOUNTER — Inpatient Hospital Stay (HOSPITAL_COMMUNITY): Payer: Medicare Other

## 2015-12-18 LAB — CBC
HCT: 31.7 % — ABNORMAL LOW (ref 36.0–46.0)
Hemoglobin: 10 g/dL — ABNORMAL LOW (ref 12.0–15.0)
MCH: 30 pg (ref 26.0–34.0)
MCHC: 31.5 g/dL (ref 30.0–36.0)
MCV: 95.2 fL (ref 78.0–100.0)
PLATELETS: 160 10*3/uL (ref 150–400)
RBC: 3.33 MIL/uL — ABNORMAL LOW (ref 3.87–5.11)
RDW: 16.5 % — AB (ref 11.5–15.5)
WBC: 10.4 10*3/uL (ref 4.0–10.5)

## 2015-12-18 LAB — GLUCOSE, CAPILLARY
GLUCOSE-CAPILLARY: 137 mg/dL — AB (ref 65–99)
GLUCOSE-CAPILLARY: 150 mg/dL — AB (ref 65–99)
GLUCOSE-CAPILLARY: 155 mg/dL — AB (ref 65–99)
GLUCOSE-CAPILLARY: 162 mg/dL — AB (ref 65–99)

## 2015-12-18 MED ORDER — LEVOFLOXACIN 500 MG PO TABS
500.0000 mg | ORAL_TABLET | Freq: Every day | ORAL | Status: DC
  Administered 2015-12-18: 500 mg via ORAL
  Filled 2015-12-18 (×2): qty 1

## 2015-12-18 NOTE — Progress Notes (Addendum)
[facility] [place]  Subjective  - feels better   Objective 108/43 76 102.1 F (38.9 C) (Oral) 18 97%  Intake/Output Summary (Last 24 hours) at 12/18/15 1137 Last data filed at 12/18/15 [id number]  Gross per 24 hour  Intake    360 ml  Output   -405 ml  Net    765 ml   Left AKA no drainage Abdomen soft non tender Neuro mildly confused but improved from yesterday  Assessment/Planning: AKA healing Fever 102 again today, blood cultures ordered yesterday peripheral and dialysis catheter, repeat today Blood culture from diatek and peripheral today Continue to mobilize and try incentive spirometer Check chest xray  Ruta Hinds 12/18/2015 11:37 AM --  Laboratory Lab Results:  Recent Labs  12/17/15 0241 12/18/15 0228  WBC 10.6* 10.4  HGB 10.1* 10.0*  HCT 31.4* 31.7*  PLT 190 160   BMET  Recent Labs  12/16/15 0340 12/17/15 0241  NA 138 135  K 5.6* 4.3  CL 98* 96*  CO2 24 26  GLUCOSE 125* 98  BUN 60* 24*  CREATININE 6.99* 4.15*  CALCIUM 9.3 8.0*    COAG No results found for: INR, PROTIME No results found for: PTT

## 2015-12-18 NOTE — Progress Notes (Signed)
Chest xray shows decreased aeration of right lower lobe possible infiltrate Will start 10 day course of Levaquin  Will need to follow up on blood cultures  Ruta Hinds, MD [facility] [place] Office: [phone] Pager: [phone]

## 2015-12-18 NOTE — Progress Notes (Signed)
Pt's family visited Pt today.  Long discussion with Pt's family.  Discussed Pt ambulation status.  Pt family wanting Pt to progress towards being able to transfer to bedside chair.   Physical therapy has not assessed Pt s/p left AKA.  Pt currently with intermittent confusion and noncompliance.  Pt also with intermittent fevers.  Discussed with family that at this time this RN uncomfortable with transferring Pt without a full lift.  Pt family indicates understanding.  Call placed to Dr. Oneida Alar and requested PT eval.  Order placed.  Pt family also asking about results of chest xray.  Call placed to Dr. Oneida Alar to alert him that chest xray results were available.  Received order to start IV antibiotics.  Continue to provide support to Pt and family.

## 2015-12-18 NOTE — Progress Notes (Signed)
Subjective:  Temp of 102 again this AM- says she does not know where she is- I told her she had surgery "i know" had short HD yest to get on schedule   Objective Vital signs in last 24 hours: Filed Vitals:   12/17/15 1500 12/17/15 1514 12/17/15 2001 12/18/15 0638  BP: 108/45 114/48 94/36 108/43  Pulse: 71 75 67 76  Temp:  98.5 F (36.9 C) 98.2 F (36.8 C) 102.1 F (38.9 C)  TempSrc:  Oral Oral Oral  Resp:  18 18 18   Height:      Weight:  60.2 kg (132 lb 11.5 oz) 60.2 kg (132 lb 11.5 oz)   SpO2:  99% 99% 97%   Weight change:   Intake/Output Summary (Last 24 hours) at 12/18/15 0827 Last data filed at 12/18/15 [id number]  Gross per 24 hour  Intake    360 ml  Output   -405 ml  Net    765 ml   Outpt HD Orders Unit: [facility] Days: [facility] Time: 4h Dialyzer: F160 EDW: 64kg K/Ca: 2/2.25 Access: TDC Needle Size: na BFR/DFR: 400/a1.5 UF Proflie: profile 4 VDRA: calcitriol 1.28mcg qTx EPO: on hold curently, since 1/10, Hb 11.8 most recent IV Fe: On 100mg  IV x10 bolus Heparin: 5000 IU IVB qTx Most Recent Phos / PTH: 6.6 / 432 Most Recent eKT/V: 1.18 Treatment Adherence: excellent  Assessment/ Plan: [name] who was admitted on 12/15/2015 with limb ischemia- s/p AKA  Assessment/Plan: 1. PAD- POD #3 AKA- per vasc 2. ESRD - normally TTS- done Fri for high K- short tx  Sat to get on schedule and to make sure she can last til Tuesday  3. Anemia- Jehovahs witness- no blood products- ESA has been on hold- will likely need to resume - hgb 10 4. Secondary hyperparathyroidism- cannot seem to find out what binder is- since calcium is up will do renvela  continue calcitriol  5. HTN/volume- if anything BP is low right now- pain meds ? On coreg- will give parameter - is better  6. Hyperkalemia- resolved  7. Post op fever- will order IS to bedside- no other focal sxm- blood cultures obtained   Janetta Vandoren A    Labs: Basic Metabolic Panel:  Recent Labs Lab  12/15/15 1529 12/16/15 0340 12/17/15 0241  NA 139 138 135  K 4.6 5.6* 4.3  CL 99* 98* 96*  CO2 26 24 26   GLUCOSE 124* 125* 98  BUN 52* 60* 24*  CREATININE 6.34* 6.99* 4.15*  CALCIUM 9.7 9.3 8.0*   Liver Function Tests: No results for input(s): AST, ALT, ALKPHOS, BILITOT, PROT, ALBUMIN in the last 168 hours. No results for input(s): LIPASE, AMYLASE in the last 168 hours. No results for input(s): AMMONIA in the last 168 hours. CBC:  Recent Labs Lab 12/16/15 0340 12/17/15 0241 12/18/15 0228  WBC 9.4 10.6* 10.4  HGB 11.0* 10.1* 10.0*  HCT 33.9* 31.4* 31.7*  MCV 92.6 94.0 95.2  PLT 226 190 160   Cardiac Enzymes: No results for input(s): CKTOTAL, CKMB, CKMBINDEX, TROPONINI in the last 168 hours. CBG:  Recent Labs Lab 12/17/15 1236 12/17/15 1325 12/17/15 1701 12/17/15 2113 12/18/15 0636  GLUCAP 60* 87 87 106* 137*    Iron Studies: No results for input(s): IRON, TIBC, TRANSFERRIN, FERRITIN in the last 72 hours. Studies/Results: No results found. Medications: Infusions:    Scheduled Medications: . aspirin EC  325 mg Oral Daily  . calcitRIOL  1.5 mcg Oral Q  M,W,F  . carvedilol  12.5 mg Oral 2 times per day on Sun Mon Wed Fri   And  . carvedilol  12.5 mg Oral Once per day on Tue Thu Sat  . docusate sodium  100 mg Oral Daily  . feeding supplement (NEPRO CARB STEADY)  237 mL Oral BID BM  . gabapentin  300 mg Oral TID  . LORazepam  0.5 mg Oral QHS  . mirtazapine  15 mg Oral QHS  . pantoprazole  40 mg Oral Daily  . sevelamer carbonate  2,400 mg Oral TID WC  . sodium chloride flush  3 mL Intravenous Q12H    have reviewed scheduled and prn medications.  Physical Exam: General: uncomfortable  Heart: RRR Lungs: mostly clear Abdomen: soft, non tender  Extremities: no edema Dialysis Access: IJ cath- also BVT - maturing    12/18/2015,8:27 AM  LOS: 3 days

## 2015-12-18 NOTE — Progress Notes (Signed)
Rcvd call to room by NT.  Pt with small amount clear emesis resembling apple juice Pt had just drank.  Per NT, Pt stated she felt nauseous.  Zofran PRN given per order.  Rectal suppository also given at this time.  Pt family called this RN during this tx.  Family updated of Pt's current status.  Rechecked Pt temp, oral temp 99.4.  Will cont to monitor closely.

## 2015-12-18 NOTE — Care Management Important Message (Signed)
Important Message  Patient Details  Name: Deborah Hendricks MRN: [id number] Date of Birth: December 26, 1945   Medicare Important Message Given:  Yes    Apolonio Schneiders, RN 12/18/2015, 10:07 AM

## 2015-12-19 LAB — CBC
HCT: 29.3 % — ABNORMAL LOW (ref 36.0–46.0)
HEMOGLOBIN: 9.5 g/dL — AB (ref 12.0–15.0)
MCH: 30.3 pg (ref 26.0–34.0)
MCHC: 32.4 g/dL (ref 30.0–36.0)
MCV: 93.3 fL (ref 78.0–100.0)
PLATELETS: 188 10*3/uL (ref 150–400)
RBC: 3.14 MIL/uL — AB (ref 3.87–5.11)
RDW: 16.3 % — ABNORMAL HIGH (ref 11.5–15.5)
WBC: 11 10*3/uL — AB (ref 4.0–10.5)

## 2015-12-19 LAB — GLUCOSE, CAPILLARY
GLUCOSE-CAPILLARY: 107 mg/dL — AB (ref 65–99)
GLUCOSE-CAPILLARY: 95 mg/dL (ref 65–99)
Glucose-Capillary: 120 mg/dL — ABNORMAL HIGH (ref 65–99)
Glucose-Capillary: 171 mg/dL — ABNORMAL HIGH (ref 65–99)

## 2015-12-19 LAB — RENAL FUNCTION PANEL
ALBUMIN: 2 g/dL — AB (ref 3.5–5.0)
ANION GAP: 12 (ref 5–15)
BUN: 33 mg/dL — ABNORMAL HIGH (ref 6–20)
CALCIUM: 8.1 mg/dL — AB (ref 8.9–10.3)
CO2: 26 mmol/L (ref 22–32)
CREATININE: 5.21 mg/dL — AB (ref 0.44–1.00)
Chloride: 96 mmol/L — ABNORMAL LOW (ref 101–111)
GFR, EST AFRICAN AMERICAN: 9 mL/min — AB (ref >60)
GFR, EST NON AFRICAN AMERICAN: 8 mL/min — AB (ref >60)
Glucose, Bld: 138 mg/dL — ABNORMAL HIGH (ref 65–99)
PHOSPHORUS: 5.8 mg/dL — AB (ref 2.5–4.6)
Potassium: 4.9 mmol/L (ref 3.5–5.1)
SODIUM: 134 mmol/L — AB (ref 135–145)

## 2015-12-19 MED ORDER — DEXTROSE 5 % IV SOLN
1.0000 g | INTRAVENOUS | Status: AC
  Administered 2015-12-19: 1 g via INTRAVENOUS
  Filled 2015-12-19: qty 1

## 2015-12-19 MED ORDER — VANCOMYCIN HCL 500 MG IV SOLR
500.0000 mg | INTRAVENOUS | Status: DC | PRN
  Administered 2015-12-20: 500 mg via INTRAVENOUS
  Filled 2015-12-19 (×3): qty 500

## 2015-12-19 MED ORDER — DEXTROSE 5 % IV SOLN
500.0000 mg | Freq: Three times a day (TID) | INTRAVENOUS | Status: DC
  Administered 2015-12-19 – 2015-12-21 (×6): 500 mg via INTRAVENOUS
  Filled 2015-12-19 (×7): qty 0.5

## 2015-12-19 MED ORDER — VANCOMYCIN HCL 500 MG IV SOLR
500.0000 mg | INTRAVENOUS | Status: AC
  Administered 2015-12-19: 500 mg via INTRAVENOUS
  Filled 2015-12-19: qty 500

## 2015-12-19 NOTE — Progress Notes (Signed)
Report given to [facility] RN who will be taking over pt care at this time.

## 2015-12-19 NOTE — Progress Notes (Addendum)
Pt's family called for information.  Was unable to give information over phone d/t pt having no password available.  Attempted to explain hipaa and pt confidentiality.  Person on other end of phone becomes hostile and verbally aggressive.  Environmental health practitioner

## 2015-12-19 NOTE — Evaluation (Signed)
Clinical/Bedside Swallow Evaluation Patient Details  Name: Deborah Hendricks MRN: [id number] Date of Birth: [birth year]  Today's Date: 12/19/2015 Time: SLP Start Time (ACUTE ONLY): [id number] SLP Stop Time (ACUTE ONLY): 1415 SLP Time Calculation (min) (ACUTE ONLY): 20 min  Past Medical History:  Past Medical History  Diagnosis Date  . Diabetes mellitus without complication ([place])   . Hypertension   . Anemia   . Critical lower limb ischemia   . Stroke ([place]) 04/2015    unable to stand .  can have diffuuclty with speech at times  . Seizures ([place]) 04/2015    since   . Kidney disease     ESRD  . Depression   . Anxiety   . Constipation   . Memory loss     since stroke 04/2015  . Peripheral vascular disease ([place])   . CHF (congestive heart failure) ([place])    Past Surgical History:  Past Surgical History  Procedure Laterality Date  . Toe amputation      R foot  . Abdominal hysterectomy    . Tonsillectomy    . Bascilic vein transposition Right 11/25/2015    Procedure: Right Arm FIRST STAGE BASILIC VEIN TRANSPOSITION;  Surgeon: Serafina Mitchell, MD;  Location: [place];  Service: Vascular;  Laterality: Right;  . Peripheral vascular catheterization Bilateral 11/28/2015    Procedure: Lower Extremity Angiography;  Surgeon: Lorretta Harp, MD;  Location: [place] [facility];  Service: Cardiovascular;  Laterality: Bilateral;  . Peripheral vascular catheterization N/A 11/28/2015    Procedure: Abdominal Aortogram;  Surgeon: Lorretta Harp, MD;  Location: [place] [facility];  Service: Cardiovascular;  Laterality: N/A;  . Amputation Left 12/15/2015    Procedure: LEFT ABOVE KNEE AMPUTATION;  Surgeon: Serafina Mitchell, MD;  Location: [facility] OR;  Service: Vascular;  Laterality: Left;   HPI:  Pt is a 70 year old female who underwent left above the knee amputation on 12/15/15. Has been spiking fevers each night and has been somnolent. CXR shows possible infiltrate in right lower lobe. Pt has also been seen to vomit recently.     Assessment / Plan / Recommendation Clinical Impression  Pt demonstrates no signs of aspiration upon subjective evaluation and no clinical indicators that put her at risk of silent aspiration. Pt is drowsy, but able to masticate well and swallow liquids consecutively. Discussed basic precautions with daughters and RN. Recommend pt resume regular diet and thin liquids. No SLP f/u warranted unless clinical concern for aspiration increases. In that case, would proceed with MBS.     Aspiration Risk  Mild aspiration risk    Diet Recommendation Regular;Thin liquid   Liquid Administration via: Cup;Straw Medication Administration: Whole meds with liquid Supervision: Staff to assist with self feeding Compensations: Slow rate;Small sips/bites Postural Changes: Seated upright at 90 degrees;Remain upright for at least 30 minutes after po intake    Other  Recommendations Oral Care Recommendations: Oral care BID   Follow up Recommendations  None    Frequency and Duration            Prognosis        Swallow Study   General HPI: Pt is a 70 year old female who underwent left above the knee amputation on 12/15/15. Has been spiking fevers each night and has been somnolent. CXR shows possible infiltrate in right lower lobe. Pt has also been seen to vomit recently.  Type of Study: Bedside Swallow Evaluation Diet Prior to this Study: NPO Temperature Spikes Noted: No  Respiratory Status: Room air History of Recent Intubation: No Behavior/Cognition: Lethargic/Drowsy;Cooperative Oral Cavity Assessment: Within Functional Limits Oral Care Completed by SLP: No Oral Cavity - Dentition: Adequate natural dentition Vision: Functional for self-feeding Self-Feeding Abilities: Needs assist Patient Positioning: Upright in bed Baseline Vocal Quality: Normal Volitional Cough: Strong Volitional Swallow: Able to elicit    Oral/Motor/Sensory Function Overall Oral Motor/Sensory Function: Within functional limits    Ice Chips Ice chips: Within functional limits   Thin Liquid Thin Liquid: Within functional limits Presentation: Cup;Straw    Nectar Thick Nectar Thick Liquid: Not tested   Honey Thick Honey Thick Liquid: Not tested   Puree Puree: Within functional limits   Solid   GO   Solid: Not tested       Herbie [place], MA CCC-SLP [id number]  Jessicamarie Amiri, Katherene Ponto 12/19/2015,2:21 PM

## 2015-12-19 NOTE — Care Management Important Message (Signed)
Important Message  Patient Details  Name: Nakhia Hamburger MRN: [id number] Date of Birth: Nov 08, 1946   Medicare Important Message Given:  Yes    Louanne Belton 12/19/2015, 12:18 [place] Message  Patient Details  Name: Anamika Herbert MRN: [id number] Date of Birth: [birth year]   Medicare Important Message Given:  Yes    Bobbie Valletta G 12/19/2015, 12:18 PM

## 2015-12-19 NOTE — Progress Notes (Signed)
ANTIBIOTIC CONSULT NOTE - INITIAL  Pharmacy Consult for Vanco/Aztreonam Indication: pneumonia  Allergies  Allergen Reactions  . Amoxicillin     Per MAR  . Latex     Per MAR  . Sulfa Antibiotics     Per MAR  . Penicillins Hives, Itching and Rash    Has patient had a PCN reaction causing immediate rash, facial/tongue/throat swelling, SOB or lightheadedness with hypotension: Yes Has patient had a PCN reaction causing severe rash involving mucus membranes or skin necrosis:  Has patient had a PCN reaction that required hospitalization  Has patient had a PCN reaction occurring within the last 10 years:  If all of the above answers are "NO", then may proceed with Cephalosporin use.    Patient Measurements: Height: 5' (152.4 cm) Weight: 132 lb 11.5 oz (60.2 kg) IBW/kg (Calculated) : 45.5 Adjusted Body Weight:   Vital Signs: Temp: 99.4 F (37.4 C) (02/06 1032) Temp Source: Oral (02/06 1032) BP: 107/45 mmHg (02/06 0522) Pulse Rate: 76 (02/06 0522) Intake/Output from previous day: 02/05 0701 - 02/06 0700 In: 600 [P.O.:600] Out: 1 [Emesis/NG output:1] Intake/Output from this shift:    Labs:  Recent Labs  12/17/15 0241 12/18/15 0228 12/19/15 0350  WBC 10.6* 10.4 11.0*  HGB 10.1* 10.0* 9.5*  PLT 190 160 188  CREATININE 4.15*  --  5.21*   Estimated Creatinine Clearance: 8.2 mL/min (by C-G formula based on Cr of 5.21). No results for input(s): VANCOTROUGH, VANCOPEAK, VANCORANDOM, GENTTROUGH, GENTPEAK, GENTRANDOM, TOBRATROUGH, TOBRAPEAK, TOBRARND, AMIKACINPEAK, AMIKACINTROU, AMIKACIN in the last 72 hours.   Microbiology: Recent Results (from the past 720 hour(s))  Culture, blood (single) w Reflex to ID Panel     Status: None (Preliminary result)   Collection Time: 12/17/15  2:15 PM  Result Value Ref Range Status   Specimen Description BLOOD HEMODIALYSIS LINE  Final   Special Requests BOTTLES DRAWN AEROBIC AND ANAEROBIC 10MLS  Final   Culture NO GROWTH 1 DAY  Final   Report Status PENDING  Incomplete  Culture, blood (single) w Reflex to ID Panel     Status: None (Preliminary result)   Collection Time: 12/17/15  2:45 PM  Result Value Ref Range Status   Specimen Description BLOOD HEMODIALYSIS LINE  Final   Special Requests BOTTLES DRAWN AEROBIC AND ANAEROBIC 10CCS  Final   Culture NO GROWTH 1 DAY  Final   Report Status PENDING  Incomplete    Medical History: Past Medical History  Diagnosis Date  . Diabetes mellitus without complication ([place])   . Hypertension   . Anemia   . Critical lower limb ischemia   . Stroke ([place]) 04/2015    unable to stand .  can have diffuuclty with speech at times  . Seizures ([place]) 04/2015    since   . Kidney disease     ESRD  . Depression   . Anxiety   . Constipation   . Memory loss     since stroke 04/2015  . Peripheral vascular disease ([place])   . CHF (congestive heart failure) [facility])     Assessment: 70 y/o F admitted 12/15/2015 with PVD with nonhealing wound left foot toes. S/p L AKA on 12/15/15. She has uncontrolled DM and has ESRD TThS, HTN, h/o CVA.  Infectious Disease: Start abx for post-op PNA. Patient lethargic and spiking temps overnight again 103.1. Early infiltrate RLL last cxr. On po levaquin but not improving.   Vanco:  -2/2: 1000mg  Vanco -2/3: 500mg  during HD,  Levaquin: 2/5 x 1 dose  Goal of Therapy:  Vancomycin level <15 prior to re-dosing post-HD.  Plan:  Give another 500mg  IV Vanco x 1 now, then resume 500mg  q HD TTS Aztreonam 1g IV x 1 then 500mg  IV q8hr   Kipper Buch S. Alford Highland, PharmD, [facility] Clinical Staff Pharmacist Pager [phone]  Eilene Ghazi Stillinger 12/19/2015,10:45 AM

## 2015-12-19 NOTE — Progress Notes (Addendum)
[facility] [place]  Subjective  - Appears comfortable non labored breathing.   Objective 107/45 76 100.5 F (38.1 C) (Oral) 18 100%  Intake/Output Summary (Last 24 hours) at 12/19/15 0739 Last data filed at 12/18/15 1824  Gross per 24 hour  Intake    600 ml  Output      1 ml  Net    599 ml    Left AKA stump healing well, clean dry dressing applied for protection. Lungs clear to auscultation   Assessment/Planning: POD # 4 Left AKA Pneumonia/ atelectasis on PO Levaquin for 10 days Blood cultures pending temp max today 103.0 today, given tylenol now 100.5 WBC 11.0 Flutter valve ordered Daughters are coming to check on there mother.  They are upset about a phone call and no one had a pass word.   Laurence Slate [facility] 12/19/2015 7:39 AM --  Laboratory Lab Results:  Recent Labs  12/18/15 0228 12/19/15 0350  WBC 10.4 11.0*  HGB 10.0* 9.5*  HCT 31.7* 29.3*  PLT 160 188   BMET  Recent Labs  12/17/15 0241 12/19/15 0350  NA 135 134*  K 4.3 4.9  CL 96* 96*  CO2 26 26  GLUCOSE 98 138*  BUN 24* 33*  CREATININE 4.15* 5.21*  CALCIUM 8.0* 8.1*    COAG No results found for: INR, PROTIME No results found for: PTT    Patient is somewhat somnolent.  She does awaken and respond appropriately and answer questions appropriately.  Her stump is healing nicely.  She consistently is having elevated fevers.  Workup is pending.  Antibiotics changed to broad-spectrum by nephrology.  Swallow study today  [name]

## 2015-12-19 NOTE — Clinical Social Work Note (Signed)
BSW intern spoke with unit nurse case manager in regards to patient discharge. Nurse case manager stated that the patient was not likely to be discharged today. BSW intern updated facility.  Deborah Hendricks BSW Intern, [id number]

## 2015-12-19 NOTE — Progress Notes (Signed)
Physical Therapy Treatment Patient Details Name: Deborah Hendricks MRN: [id number] DOB: Apr 28, 1946 Today's Date: 12/19/2015    History of Present Illness pt is a 70 y/o female with h/o DM, HTN, critical lowerlimb ischemia, stroke, PVD, CHF admitted with L LE gangrenous wounds.  pt s/p L AKA due to no viable revascularization options.    PT Comments    Progressing slowly   Follow Up Recommendations  SNF     Equipment Recommendations  None recommended by PT    Recommendations for Other Services       Precautions / Restrictions Precautions Precautions: Fall    Mobility  Bed Mobility Overal bed mobility: Needs Assistance Bed Mobility: Sit to Supine     Supine to sit: Total assist Sit to supine: Total assist   General bed mobility comments: total assist bridge "drag" to EOB, total assist to sit up.   Transfers Overall transfer level: Needs assistance   Transfers: Squat Pivot Transfers;Sit to/from Stand Sit to Stand: Total assist   Squat pivot transfers: Total assist     General transfer comment: pt with very little ability to assist with LE  Ambulation/Gait                 Stairs            Wheelchair Mobility    Modified Rankin (Stroke Patients Only)       Balance Overall balance assessment: Needs assistance Sitting-balance support: Feet supported;Single extremity supported;Bilateral upper extremity supported Sitting balance-Leahy Scale: Poor Sitting balance - Comments: Still listing to L, but better sitting balance post transfer                            Cognition Arousal/Alertness: Lethargic Behavior During Therapy: WFL for tasks assessed/performed Overall Cognitive Status: History of cognitive impairments - at baseline                      Exercises Amputee Exercises Hip Extension: AAROM;Left;10 reps;Supine Hip ABduction/ADduction: AROM;Left;10 reps;Supine Hip Flexion/Marching: AROM;Left;10 reps;Sidelying     General Comments General comments (skin integrity, edema, etc.): Discussed positioning, stump wrapping and need for strengthening both legs.      Pertinent Vitals/Pain Pain Assessment: Faces Faces Pain Scale: Hurts even more Pain Location: left stump Pain Descriptors / Indicators: Grimacing Pain Intervention(s): Monitored during session    Home Living Family/patient expects to be discharged to:: Skilled nursing facility                    Prior Function Level of Independence: Needs assistance (at w/c level and assisted transfers for >6 months)          PT Goals (current goals can now be found in the care plan section) Acute Rehab PT Goals Patient Stated Goal: I want to work toward a leg...pt's daughter want pt to be able to help with transfers bed to chair. PT Goal Formulation: With patient Time For Goal Achievement: 01/02/16 Potential to Achieve Goals: Good Progress towards PT goals: Progressing toward goals    Frequency  Min 2X/week    PT Plan Current plan remains appropriate    Co-evaluation             End of Session   Activity Tolerance: Patient limited by fatigue;Patient limited by lethargy Patient left: in bed;with call bell/phone within reach;with family/visitor present     Time: [id number] PT Time Calculation (min) (ACUTE ONLY): 14 min  Charges:  $  Therapeutic Activity: 8-22 mins                    G Codes:      Caydan Mctavish, Tessie Fass 12/19/2015, 6:04 PM 12/19/2015  Donnella Sham, PT [phone]  (pager)

## 2015-12-19 NOTE — Progress Notes (Signed)
  [facility] Progress Note   Subjective: lethargic, spiking temps to 103 again  Filed Vitals:   12/18/15 1823 12/18/15 2115 12/19/15 0522 12/19/15 0734  BP:  134/51 107/45   Pulse:  73 76   Temp: 99.4 F (37.4 C) 98.8 F (37.1 C) 103.1 F (39.5 C) 100.5 F (38.1 C)  TempSrc: Oral Oral Oral   Resp:  18 18   Height:      Weight:      SpO2:  100% 100%     Inpatient medications: . aspirin EC  325 mg Oral Daily  . calcitRIOL  1.5 mcg Oral Q M,W,F  . carvedilol  12.5 mg Oral 2 times per day on Sun Mon Wed Fri   And  . carvedilol  12.5 mg Oral Once per day on Tue Thu Sat  . docusate sodium  100 mg Oral Daily  . feeding supplement (NEPRO CARB STEADY)  237 mL Oral BID BM  . gabapentin  300 mg Oral TID  . levofloxacin  500 mg Oral Daily  . LORazepam  0.5 mg Oral QHS  . mirtazapine  15 mg Oral QHS  . pantoprazole  40 mg Oral Daily  . sevelamer carbonate  2,400 mg Oral TID WC  . sodium chloride flush  3 mL Intravenous Q12H     sodium chloride, acetaminophen **OR** acetaminophen, alum & mag hydroxide-simeth, bisacodyl, glucagon (human recombinant), guaiFENesin-dextromethorphan, hydrALAZINE, labetalol, lactulose, metoprolol, morphine injection, ondansetron, ondansetron, oxyCODONE, oxyCODONE-acetaminophen, phenol, sodium chloride flush  Exam: Somnolent, arouses briefly RRR 2/6 sem no RG Chest clear bilat Abd soft ntnd Ext no edema, L AKA wrapped Neuro nonfocal, bilat grip, no facial droop, lethargic O x 2 HD IJ cath, also BVT AVF maturing +bruit   2/5 CXR poss early RLL infiltrate  [facility]   4h  64kg  F160   2/2.25 bath  IJ Cath/ maturing AVF   Hep 5000 Calcitriol 1.5 ug Venofer getting bolus  No esa       Assessment: 1 S/P L AKA 12/15/15 2 ESRD TTS HD 3 Anemia / Jehovah's witness - esa on hold, Hb 10 4 MBD started renvela here, not sure home binder 5 HTN on coreg only 6 Vol under dry wt approp after amp 7 Fevers - depressed MS, 103deg still  spiking nightly. Early infiltrate RLL last cxr. On po levaquin but not improving  Plan - will change to broad-spec IV abx (pharm assistance due to PCN allergy). HD tomorrow.    Kelly Splinter MD [facility] pager [phone]    cell [phone] 12/19/2015, 8:47 AM    Recent Labs Lab 12/16/15 0340 12/17/15 0241 12/19/15 0350  NA 138 135 134*  K 5.6* 4.3 4.9  CL 98* 96* 96*  CO2 24 26 26   GLUCOSE 125* 98 138*  BUN 60* 24* 33*  CREATININE 6.99* 4.15* 5.21*  CALCIUM 9.3 8.0* 8.1*  PHOS  --   --  5.8*    Recent Labs Lab 12/19/15 0350  ALBUMIN 2.0*    Recent Labs Lab 12/17/15 0241 12/18/15 0228 12/19/15 0350  WBC 10.6* 10.4 11.0*  HGB 10.1* 10.0* 9.5*  HCT 31.4* 31.7* 29.3*  MCV 94.0 95.2 93.3  PLT 190 160 188

## 2015-12-19 NOTE — Evaluation (Signed)
Physical Therapy Evaluation Patient Details Name: Deborah Hendricks MRN: [id number] DOB: [birth year] Today's Date: 12/19/2015   History of Present Illness  pt is a 70 y/o female with h/o DM, HTN, critical lowerlimb ischemia, stroke, PVD, CHF admitted with L LE gangrenous wounds.  pt s/p L AKA due to no viable revascularization options.  Clinical Impression  Pt admitted with/for management of L LE wounds, s/p L AKA.  Pt currently limited functionally due to the problems listed. ( See problems list.)   Pt will benefit from PT to maximize function and safety in order to get ready for next venue listed below.     Follow Up Recommendations SNF    Equipment Recommendations  None recommended by PT    Recommendations for Other Services       Precautions / Restrictions Precautions Precautions: Fall      Mobility  Bed Mobility Overal bed mobility: Needs Assistance Bed Mobility: Supine to Sit     Supine to sit: Total assist     General bed mobility comments: total assist bridge "drag" to EOB, total assist to sit up.   Transfers Overall transfer level: Needs assistance   Transfers: Squat Pivot Transfers     Squat pivot transfers: Total assist     General transfer comment: no LE assist for transfer, but pt did grab onto therapist arms to assist  Ambulation/Gait                Stairs            Wheelchair Mobility    Modified Rankin (Stroke Patients Only)       Balance Overall balance assessment: Needs assistance Sitting-balance support: Feet supported;Single extremity supported;Bilateral upper extremity supported Sitting balance-Leahy Scale: Poor Sitting balance - Comments: lists L and posteriorly without assist, unable to come back right or forward.                                     Pertinent Vitals/Pain Pain Assessment: Faces Faces Pain Scale: Hurts even more Pain Location: left stump Pain Descriptors / Indicators: Grimacing Pain  Intervention(s): Monitored during session;Limited activity within patient's tolerance    Home Living Family/patient expects to be discharged to:: Skilled nursing facility                      Prior Function Level of Independence: Needs assistance (at w/c level and assisted transfers for >6 months)               Hand Dominance        Extremity/Trunk Assessment   Upper Extremity Assessment: Generalized weakness           Lower Extremity Assessment: Generalized weakness;RLE deficits/detail RLE Deficits / Details: grossly 2/5 and significant neuropathy       Communication   Communication: No difficulties  Cognition Arousal/Alertness: Lethargic Behavior During Therapy: WFL for tasks assessed/performed Overall Cognitive Status: History of cognitive impairments - at baseline                      General Comments General comments (skin integrity, edema, etc.): Discussed positioning, stump wrapping and need for strengthening both legs.    Exercises Amputee Exercises Hip Extension: AAROM;Left;10 reps;Supine Hip ABduction/ADduction: AROM;Left;10 reps;Supine Hip Flexion/Marching: AROM;Left;10 reps;Sidelying      Assessment/Plan    PT Assessment Patient needs continued PT services  PT Diagnosis Generalized weakness;Acute  pain   PT Problem List Decreased strength;Decreased activity tolerance;Decreased balance;Decreased mobility;Decreased coordination;Impaired sensation;Pain;Decreased skin integrity  PT Treatment Interventions Functional mobility training;Therapeutic activities;Therapeutic exercise;Balance training;Patient/family education   PT Goals (Current goals can be found in the Care Plan section) Acute Rehab PT Goals Patient Stated Goal: I want to work toward a leg...pt's daughter want pt to be able to help with transfers bed to chair. PT Goal Formulation: With patient Time For Goal Achievement: 01/02/16 Potential to Achieve Goals: Good     Frequency Min 2X/week   Barriers to discharge        Co-evaluation               End of Session   Activity Tolerance: Patient limited by lethargy;Patient limited by fatigue;Patient tolerated treatment well Patient left: in chair;with call bell/phone within reach;with family/visitor present Nurse Communication: Mobility status         Time: [id number] PT Time Calculation (min) (ACUTE ONLY): 33 min   Charges:   PT Evaluation $PT Eval Moderate Complexity: 1 Procedure PT Treatments $Therapeutic Activity: 8-22 mins   PT G Codes:        Joaquim Tolen, Tessie Fass 12/19/2015, 4:08 PM  12/19/2015  Donnella Sham, PT [phone]  (pager)

## 2015-12-19 NOTE — Progress Notes (Signed)
Pt family would like Physical therapy and talk to someone about preparations for left AKA.

## 2015-12-20 ENCOUNTER — Inpatient Hospital Stay (HOSPITAL_COMMUNITY): Payer: Medicare Other

## 2015-12-20 LAB — GLUCOSE, CAPILLARY
GLUCOSE-CAPILLARY: 108 mg/dL — AB (ref 65–99)
GLUCOSE-CAPILLARY: 186 mg/dL — AB (ref 65–99)
Glucose-Capillary: 167 mg/dL — ABNORMAL HIGH (ref 65–99)

## 2015-12-20 LAB — RENAL FUNCTION PANEL
Albumin: 1.9 g/dL — ABNORMAL LOW (ref 3.5–5.0)
Anion gap: 16 — ABNORMAL HIGH (ref 5–15)
BUN: 52 mg/dL — AB (ref 6–20)
CHLORIDE: 95 mmol/L — AB (ref 101–111)
CO2: 24 mmol/L (ref 22–32)
CREATININE: 6.7 mg/dL — AB (ref 0.44–1.00)
Calcium: 8.3 mg/dL — ABNORMAL LOW (ref 8.9–10.3)
GFR calc Af Amer: 6 mL/min — ABNORMAL LOW (ref >60)
GFR calc non Af Amer: 6 mL/min — ABNORMAL LOW (ref >60)
GLUCOSE: 104 mg/dL — AB (ref 65–99)
POTASSIUM: 6.5 mmol/L — AB (ref 3.5–5.1)
Phosphorus: 7.9 mg/dL — ABNORMAL HIGH (ref 2.5–4.6)
Sodium: 135 mmol/L (ref 135–145)

## 2015-12-20 LAB — CBC WITH DIFFERENTIAL/PLATELET
BASOS PCT: 0 %
Basophils Absolute: 0 10*3/uL (ref 0.0–0.1)
Eosinophils Absolute: 0.3 10*3/uL (ref 0.0–0.7)
Eosinophils Relative: 3 %
HEMATOCRIT: 29.4 % — AB (ref 36.0–46.0)
HEMOGLOBIN: 9.4 g/dL — AB (ref 12.0–15.0)
Lymphocytes Relative: 20 %
Lymphs Abs: 1.7 10*3/uL (ref 0.7–4.0)
MCH: 29.8 pg (ref 26.0–34.0)
MCHC: 32 g/dL (ref 30.0–36.0)
MCV: 93.3 fL (ref 78.0–100.0)
MONOS PCT: 9 %
Monocytes Absolute: 0.8 10*3/uL (ref 0.1–1.0)
NEUTROS ABS: 5.8 10*3/uL (ref 1.7–7.7)
Neutrophils Relative %: 68 %
Platelets: 232 10*3/uL (ref 150–400)
RBC: 3.15 MIL/uL — AB (ref 3.87–5.11)
RDW: 16.4 % — ABNORMAL HIGH (ref 11.5–15.5)
WBC: 8.6 10*3/uL (ref 4.0–10.5)

## 2015-12-20 MED ORDER — DARBEPOETIN ALFA 100 MCG/0.5ML IJ SOSY
100.0000 ug | PREFILLED_SYRINGE | Freq: Once | INTRAMUSCULAR | Status: DC
  Filled 2015-12-20: qty 0.5

## 2015-12-20 MED ORDER — SODIUM CHLORIDE 0.9 % IV SOLN
125.0000 mg | INTRAVENOUS | Status: DC
  Filled 2015-12-20 (×2): qty 10

## 2015-12-20 MED ORDER — LIDOCAINE-PRILOCAINE 2.5-2.5 % EX CREA: 1.0000 "application " | TOPICAL_CREAM | CUTANEOUS | Status: DC | PRN

## 2015-12-20 MED ORDER — HEPARIN SODIUM (PORCINE) 1000 UNIT/ML DIALYSIS
2000.0000 [IU] | INTRAMUSCULAR | Status: DC | PRN
Start: 2015-12-21 — End: 2015-12-21

## 2015-12-20 MED ORDER — PENTAFLUOROPROP-TETRAFLUOROETH EX AERO: 1.0000 "application " | INHALATION_SPRAY | CUTANEOUS | Status: DC | PRN

## 2015-12-20 MED ORDER — LIDOCAINE HCL (PF) 1 % IJ SOLN: 5.0000 mL | INTRAMUSCULAR | Status: DC | PRN

## 2015-12-20 MED ORDER — SODIUM CHLORIDE 0.9 % IV SOLN: 100.0000 mL | INTRAVENOUS | Status: DC | PRN

## 2015-12-20 MED ORDER — HEPARIN SODIUM (PORCINE) 1000 UNIT/ML DIALYSIS: 1000.0000 [IU] | INTRAMUSCULAR | Status: DC | PRN

## 2015-12-20 MED ORDER — ALTEPLASE 2 MG IJ SOLR: 2.0000 mg | Freq: Once | INTRAMUSCULAR | Status: DC | PRN

## 2015-12-20 MED ORDER — DARBEPOETIN ALFA 100 MCG/0.5ML IJ SOSY
100.0000 ug | PREFILLED_SYRINGE | Freq: Once | INTRAMUSCULAR | Status: AC
Start: 2015-12-20 — End: 2015-12-20
  Administered 2015-12-20: 100 ug via SUBCUTANEOUS
  Filled 2015-12-20: qty 0.5

## 2015-12-20 MED ORDER — DARBEPOETIN ALFA 100 MCG/0.5ML IJ SOSY: 100.0000 ug | PREFILLED_SYRINGE | INTRAMUSCULAR | Status: DC

## 2015-12-20 NOTE — Progress Notes (Signed)
Physical Therapy Treatment Patient Details Name: Deborah Hendricks MRN: [id number] DOB: April 28, 1946 Today's Date: 12/20/2015    History of Present Illness pt is a 70 y/o female with h/o DM, HTN, critical lowerlimb ischemia, stroke, PVD, CHF admitted with L LE gangrenous wounds.  pt s/p L AKA due to no viable revascularization options.    PT Comments    Initially pt confused and irritated coming out of a nap.  She became more focused as treatment progressed.  Emphasis on exercise, transition to sitting EOB.  Sitting balance/scooting to EOB and transfer to the chair.  Follow Up Recommendations  SNF     Equipment Recommendations  None recommended by PT    Recommendations for Other Services       Precautions / Restrictions Precautions Precautions: Fall Restrictions Weight Bearing Restrictions: Yes    Mobility  Bed Mobility Overal bed mobility: Needs Assistance Bed Mobility: Sit to Supine     Supine to sit: Max assist     General bed mobility comments: total assist pivot on pad toward EOB and max A to come forward.  Transfers Overall transfer level: Needs assistance   Transfers: Stand Pivot Transfers   Stand pivot transfers: Total assist;+2 safety/equipment       General transfer comment: pt with more effort through R LE today, though still numb with neuropathy and profoundly weak.  Ambulation/Gait             General Gait Details: NA   Stairs            Wheelchair Mobility    Modified Rankin (Stroke Patients Only)       Balance Overall balance assessment: Needs assistance Sitting-balance support: Single extremity supported;Bilateral upper extremity supported Sitting balance-Leahy Scale: Poor Sitting balance - Comments: Still listing mildly L, but after balance work EOB can correct to midline.  Added in LAQ's to balance challenge.                            Cognition Arousal/Alertness: Awake/alert Behavior During Therapy: Agitated;WFL  for tasks assessed/performed Overall Cognitive Status: History of cognitive impairments - at baseline                      Exercises Amputee Exercises Hip Extension: AAROM;Left;10 reps;Supine Hip ABduction/ADduction: Left;10 reps;Supine;AAROM Hip Flexion/Marching: Left;10 reps;Sidelying;AAROM    General Comments General comments (skin integrity, edema, etc.): Reinforced postitioning with pt.      Pertinent Vitals/Pain Pain Assessment: Faces Faces Pain Scale: Hurts little more Pain Location: L stump Pain Descriptors / Indicators: Sore Pain Intervention(s): Monitored during session    Home Living Family/patient expects to be discharged to:: Skilled nursing facility                    Prior Function Level of Independence: Needs assistance          PT Goals (current goals can now be found in the care plan section) Acute Rehab PT Goals PT Goal Formulation: With patient Time For Goal Achievement: 01/02/16 Potential to Achieve Goals: Good Progress towards PT goals: Progressing toward goals    Frequency  Min 2X/week    PT Plan Current plan remains appropriate    Co-evaluation             End of Session   Activity Tolerance: Patient tolerated treatment well;Patient limited by fatigue Patient left: in chair;with call bell/phone within reach     Time: [id number]  PT Time Calculation (min) (ACUTE ONLY): 38 min  Charges:  $Therapeutic Exercise: 8-22 mins $Therapeutic Activity: 23-37 mins                    G Codes:      Deborah Hendricks, Tessie Fass 12/20/2015, 4:19 PM  12/20/2015  Deborah Hendricks, PT [phone]  (pager)

## 2015-12-20 NOTE — Progress Notes (Signed)
Pt has become aggressive and irritated. Pt refused medications,  pulled IV out, and removed leads from pacing box. Pt keeps stating " leave me alone". Will continue to monitor.

## 2015-12-20 NOTE — Progress Notes (Signed)
  [facility] Progress Note   Subjective: less lethargic today  Filed Vitals:   12/20/15 0730 12/20/15 0800 12/20/15 0830 12/20/15 0858  BP: 105/52 102/50 104/51 101/65  Pulse: 79 79 78 77  Temp:      TempSrc:      Resp:      Height:      Weight:      SpO2:        Inpatient medications: . aspirin EC  325 mg Oral Daily  . aztreonam  500 mg Intravenous Q8H  . calcitRIOL  1.5 mcg Oral Q M,W,F  . carvedilol  12.5 mg Oral 2 times per day on Sun Mon Wed Fri   And  . carvedilol  12.5 mg Oral Once per day on Tue Thu Sat  . docusate sodium  100 mg Oral Daily  . feeding supplement (NEPRO CARB STEADY)  237 mL Oral BID BM  . gabapentin  300 mg Oral TID  . LORazepam  0.5 mg Oral QHS  . mirtazapine  15 mg Oral QHS  . pantoprazole  40 mg Oral Daily  . sevelamer carbonate  2,400 mg Oral TID WC  . sodium chloride flush  3 mL Intravenous Q12H     sodium chloride, sodium chloride, sodium chloride, acetaminophen **OR** acetaminophen, alteplase, alum & mag hydroxide-simeth, bisacodyl, glucagon (human recombinant), guaiFENesin-dextromethorphan, heparin, [START ON 12/21/2015] heparin, hydrALAZINE, labetalol, lactulose, lidocaine (PF), lidocaine-prilocaine, metoprolol, morphine injection, ondansetron, ondansetron, oxyCODONE, oxyCODONE-acetaminophen, pentafluoroprop-tetrafluoroeth, phenol, sodium chloride flush, vancomycin  Exam: More alert today, slurred speech, no facial droop RRR 2/6 sem no RG Chest clear bilat Abd soft ntnd Ext no edema, L AKA wrapped Neuro symmetric grip, mild confused, "hospital", " [place]" HD IJ cath R fem clean exit site, also BVT AVF maturing +bruit   2/5 CXR poss early RLL infiltrate  [facility]   4h  64kg  F160   2/2.25 bath  IJ Cath/ maturing AVF   Hep 5000 Calcitriol 1.5 ug Venofer getting bolus  No esa       Assessment: 1 S/P L AKA 12/15/15 2 Fevers/ AMS - blood cx's neg, doubt HD cath infection. Prob PNA, d#2 IV abx. Head CT neg.  2  ESRD TTS HD 3 Anemia / Jehovah's witness - esa on hold, Hb 10 4 MBD started renvela here, not sure home binder 5 HTN on coreg only 6 Vol under dry wt approp after amp 7 Hyperkalemia  Plan - HD today, cont IV abx, low K bath   Kelly Splinter MD [facility] pager [phone]    cell [phone] 12/20/2015, 9:42 AM    Recent Labs Lab 12/17/15 0241 12/19/15 0350 12/20/15 0750  NA 135 134* 135  K 4.3 4.9 6.5*  CL 96* 96* 95*  CO2 26 26 24   GLUCOSE 98 138* 104*  BUN 24* 33* 52*  CREATININE 4.15* 5.21* 6.70*  CALCIUM 8.0* 8.1* 8.3*  PHOS  --  5.8* 7.9*    Recent Labs Lab 12/19/15 0350 12/20/15 0750  ALBUMIN 2.0* 1.9*    Recent Labs Lab 12/18/15 0228 12/19/15 0350 12/20/15 0750  WBC 10.4 11.0* 8.6  NEUTROABS  --   --  5.8  HGB 10.0* 9.5* 9.4*  HCT 31.7* 29.3* 29.4*  MCV 95.2 93.3 93.3  PLT 160 188 232

## 2015-12-20 NOTE — Progress Notes (Signed)
Pt's daughter called to check up on her mother. Updated daughter. Daughter stated that they will stop by tomorrow after pt's dialysis.

## 2015-12-20 NOTE — Progress Notes (Signed)
Pt has refused to get back in bed. Pt refused IV team to put another IV in. Will continue to monitor.

## 2015-12-20 NOTE — Progress Notes (Signed)
Critical lab value potassium of 6.7. Pt currently in dialysis. MD notified.

## 2015-12-20 NOTE — Progress Notes (Signed)
    Subjective  -   Feels better   Physical Exam:  More alert   Incision ok    Assessment/Plan:    Fever curve improved. Blood cx neg to date Hopeful d/c if remains afebrile tomorrow [name] 12/20/2015 2:14 PM --  Filed Vitals:   12/20/15 1050 12/20/15 1408  BP: 121/57 112/51  Pulse: 70 85  Temp: 99.3 F (37.4 C) 100.5 F (38.1 C)  Resp: 20 18    Intake/Output Summary (Last 24 hours) at 12/20/15 1414 Last data filed at 12/20/15 1050  Gross per 24 hour  Intake      0 ml  Output      0 ml  Net      0 ml     Laboratory CBC    Component Value Date/Time   WBC 8.6 12/20/2015 0750   HGB 9.4* 12/20/2015 0750   HCT 29.4* 12/20/2015 0750   PLT 232 12/20/2015 0750    BMET    Component Value Date/Time   NA 135 12/20/2015 0750   K 6.5* 12/20/2015 0750   CL 95* 12/20/2015 0750   CO2 24 12/20/2015 0750   GLUCOSE 104* 12/20/2015 0750   BUN 52* 12/20/2015 0750   CREATININE 6.70* 12/20/2015 0750   CALCIUM 8.3* 12/20/2015 0750   GFRNONAA 6* 12/20/2015 0750   GFRAA 6* 12/20/2015 0750    COAG No results found for: INR, PROTIME No results found for: PTT  Antibiotics Anti-infectives    Start     Dose/Rate Route Frequency Ordered Stop   12/19/15 2000  aztreonam (AZACTAM) 500 mg in dextrose 5 % 50 mL IVPB     500 mg 100 mL/hr over 30 Minutes Intravenous Every 8 hours 12/19/15 1042     12/19/15 1045  vancomycin (VANCOCIN) 500 mg in sodium chloride 0.9 % 100 mL IVPB     500 mg 100 mL/hr over 60 Minutes Intravenous NOW 12/19/15 1042 12/19/15 1220   12/19/15 1045  aztreonam (AZACTAM) 1 g in dextrose 5 % 50 mL IVPB     1 g 100 mL/hr over 30 Minutes Intravenous NOW 12/19/15 1042 12/19/15 1204   12/19/15 1041  vancomycin (VANCOCIN) 500 mg in sodium chloride 0.9 % 100 mL IVPB     500 mg 100 mL/hr over 60 Minutes Intravenous Every Dialysis 12/19/15 1042     12/18/15 1415  levofloxacin (LEVAQUIN) tablet 500 mg  Status:  Discontinued    Comments:  Please  have pharmacy adjust dose as deemed appropriate 10 day course for pneumonia   500 mg Oral Daily 12/18/15 1414 12/19/15 0949   12/16/15 1000  vancomycin (VANCOCIN) 500 mg in sodium chloride 0.9 % 100 mL IVPB     500 mg 100 mL/hr over 60 Minutes Intravenous  Once 12/15/15 1515 12/16/15 1115   12/15/15 1515  vancomycin (VANCOCIN) IVPB 1000 mg/200 mL premix  Status:  Discontinued     1,000 mg 200 mL/hr over 60 Minutes Intravenous Every 12 hours 12/15/15 1500 12/15/15 1512   12/14/15 1238  vancomycin (VANCOCIN) IVPB 1000 mg/200 mL premix     1,000 mg 200 mL/hr over 60 Minutes Intravenous 60 min pre-op 12/14/15 1238 12/15/15 1140       V. Leia Alf, M.D. [facility] Office: [phone] Pager:  [phone]

## 2015-12-21 LAB — BASIC METABOLIC PANEL
Anion gap: 12 (ref 5–15)
BUN: 32 mg/dL — AB (ref 6–20)
CO2: 29 mmol/L (ref 22–32)
Calcium: 8.4 mg/dL — ABNORMAL LOW (ref 8.9–10.3)
Chloride: 97 mmol/L — ABNORMAL LOW (ref 101–111)
Creatinine, Ser: 4.09 mg/dL — ABNORMAL HIGH (ref 0.44–1.00)
GFR calc Af Amer: 12 mL/min — ABNORMAL LOW (ref >60)
GFR calc non Af Amer: 10 mL/min — ABNORMAL LOW (ref >60)
Glucose, Bld: 201 mg/dL — ABNORMAL HIGH (ref 65–99)
Potassium: 3.5 mmol/L (ref 3.5–5.1)
Sodium: 138 mmol/L (ref 135–145)

## 2015-12-21 LAB — GLUCOSE, CAPILLARY
GLUCOSE-CAPILLARY: 179 mg/dL — AB (ref 65–99)
GLUCOSE-CAPILLARY: 110 mg/dL — AB (ref 65–99)
GLUCOSE-CAPILLARY: 244 mg/dL — AB (ref 65–99)
Glucose-Capillary: 221 mg/dL — ABNORMAL HIGH (ref 65–99)

## 2015-12-21 MED ORDER — DEXTROSE 5 % IV SOLN
1.0000 g | INTRAVENOUS | Status: AC
  Administered 2015-12-22: 1 g via INTRAVENOUS
  Filled 2015-12-21 (×2): qty 1

## 2015-12-21 MED ORDER — VANCOMYCIN HCL 500 MG IV SOLR
500.0000 mg | INTRAVENOUS | Status: AC
  Filled 2015-12-21: qty 500

## 2015-12-21 NOTE — Plan of Care (Signed)
Problem: Pain Managment: Goal: General experience of comfort will improve Outcome: Progressing Pt complained of pain at left stump surgical site x 1 this shift. Tylenol given with relief noted. Pt has not had any more complaints of pain.  Problem: Skin Integrity: Goal: Risk for impaired skin integrity will decrease Outcome: Progressing Staff continue to turn and reposition pt every 2 hours in bed. Peri care provided as needed as pt is incontinent.

## 2015-12-21 NOTE — Progress Notes (Signed)
Anticipate DC tomorrow- [facility] can accept pt back tomorrow if stable  CSW will continue to follow  Domenica Reamer, [place] Social Worker [phone]

## 2015-12-21 NOTE — Progress Notes (Signed)
  Progress Note    12/21/2015 7:36 AM 6 Days Post-Op  Subjective:  No complaints-stump feels fine  Tm100.5 now afebrile HR 70's-80's  0000000 systolic [id number] RA  Filed Vitals:   12/20/15 1957 12/21/15 0552  BP: 117/44 105/44  Pulse: 73 74  Temp: 99 F (37.2 C) 98.3 F (36.8 C)  Resp: 18 16    Physical Exam: Cardiac:  regular Lungs:  Non labored Incisions:  Left stump is healing nicely; right arm incision is clean and dry Extremities:  Moving stump without difficulty.  Right arm 1st stage BVT with good thrill/bruit; insertion site of TDC in right groin is clean.   CBC    Component Value Date/Time   WBC 8.6 12/20/2015 0750   RBC 3.15* 12/20/2015 0750   HGB 9.4* 12/20/2015 0750   HCT 29.4* 12/20/2015 0750   PLT 232 12/20/2015 0750   MCV 93.3 12/20/2015 0750   MCH 29.8 12/20/2015 0750   MCHC 32.0 12/20/2015 0750   RDW 16.4* 12/20/2015 0750   LYMPHSABS 1.7 12/20/2015 0750   MONOABS 0.8 12/20/2015 0750   EOSABS 0.3 12/20/2015 0750   BASOSABS 0.0 12/20/2015 0750    BMET    Component Value Date/Time   NA 138 12/21/2015 0515   K 3.5 12/21/2015 0515   CL 97* 12/21/2015 0515   CO2 29 12/21/2015 0515   GLUCOSE 201* 12/21/2015 0515   BUN 32* 12/21/2015 0515   CREATININE 4.09* 12/21/2015 0515   CALCIUM 8.4* 12/21/2015 0515   GFRNONAA 10* 12/21/2015 0515   GFRAA 12* 12/21/2015 0515    INR No results found for: INR   Intake/Output Summary (Last 24 hours) at 12/21/15 0736 Last data filed at 12/20/15 1050  Gross per 24 hour  Intake      0 ml  Output      0 ml  Net      0 ml     Assessment:  70 y.o. female is s/p:  Left AKA  6 Days Post-Op  Plan: -pt doing well with left AKA and it is healing nicely.   -pt with Tm of 100.5 yesterday afternoon, but has been afebrile since (two are 2 days and 2 are 3 days).  Her blood cx continue to be negative to day.  Right arm incision, left stump and insertion site of right femoral catheter are all clean and look  good.   CBC was trending downward yesterday. -she has f/u appt for her right arm 1st stage BVT on 01/09/16 with duplex-we will check her stump with possible staple removal at that time as well. -discharge ABx per renal at d/c.  She is on Aztreonam q8h and vanc with HD currently.   Leontine Locket, PA-C [facility] [phone] 12/21/2015 7:36 AM    Agree with the above.  Fever curve has decreased.  Cultures have been negative.  Her stump looks healthy.  Would recommend discharge back to nursing home.  We'll discuss with renal, the appropriate antibiotic regimen.  She will follow up with me in the office for staple removal in one month  [place] Syreeta Figler

## 2015-12-21 NOTE — Progress Notes (Signed)
Pharmacy Antibiotic Note  Deborah Hendricks is a 70 y.o. female admitted on 12/15/2015 with PVD and nonhealing L toe wound.  Now s/p L AKA.  Pt developed post-op PNA and was startedon Vancomycin and Aztreonam.  Pt has ESRD with usual HD das TTS.  Today is day # 7 of Vancomycin, day # 3 of Aztreonam.  Plan: Change Aztreonam to 1gm IV q24h - give dose after HD on HD days. Continue Vancomycin 500mg  IV qHD TTS Cx ngtd - follow up abx length of therapy.  Recommend 7d for PNA.  Height: 5' (152.4 cm) Weight: 134 lb 0.6 oz (60.8 kg) IBW/kg (Calculated) : 45.5  Temp (24hrs), Avg:99.3 F (37.4 C), Min:98.3 F (36.8 C), Max:100.5 F (38.1 C)   Recent Labs Lab 12/16/15 0340 12/17/15 0241 12/18/15 0228 12/19/15 0350 12/20/15 0750 12/21/15 0515  WBC 9.4 10.6* 10.4 11.0* 8.6  --   CREATININE 6.99* 4.15*  --  5.21* 6.70* 4.09*    Estimated Creatinine Clearance: 10.4 mL/min (by C-G formula based on Cr of 4.09).    Allergies  Allergen Reactions  . Amoxicillin     Per MAR  . Latex     Per MAR  . Sulfa Antibiotics     Per MAR  . Penicillins Hives, Itching and Rash    Has patient had a PCN reaction causing immediate rash, facial/tongue/throat swelling, SOB or lightheadedness with hypotension: Yes Has patient had a PCN reaction causing severe rash involving mucus membranes or skin necrosis:  Has patient had a PCN reaction that required hospitalization  Has patient had a PCN reaction occurring within the last 10 years:  If all of the above answers are "NO", then may proceed with Cephalosporin use.    Antimicrobials this admission: Vanco 2/2>> Aztreonam 2/6>> Levaquin: 2/5 x 1 dose  Dose adjustments this admission:   Microbiology results: 2/4: BC x 2>> ngtd 2/5: BC x 2>> ngtd  Thank you for allowing pharmacy to be a part of this patient's care.  [employer], Pharm.D., BCPS Clinical Pharmacist Pager [phone] 12/21/2015 12:25 PM

## 2015-12-21 NOTE — Care Management Note (Signed)
Case Management Note  Patient Details  Name: Deborah Hendricks MRN: [id number] Date of Birth: [birth year]  Subjective/Objective:  70 y.o. F admitted for Confusion, fever from [facility] Will return to SNF at discharge.                   Action/Plan: Anticipate discharge home Thursday after [facility] HD. Discharge is deferred to [place]. No further CM needs but will be available should additional discharge needs arise.   Expected Discharge Date:                  Expected Discharge Plan:  Skilled Nursing Facility  In-House Referral:  Clinical Social Work  Discharge planning Services  CM Consult  Post Acute Care Choice:    Choice offered to:     DME Arranged:    DME Agency:     HH Arranged:    [place] Agency:     Status of Service:  In process, will continue to follow  Medicare Important Message Given:  Yes Date Medicare IM Given:    Medicare IM give by:    Date Additional Medicare IM Given:    Additional Medicare Important Message give by:     If discussed at  [place] of Stay Meetings, dates discussed:    Additional Comments:  Delrae Sawyers, RN 12/21/2015, 3:09 PM

## 2015-12-21 NOTE — Progress Notes (Signed)
  [facility] Progress Note   Subjective: much more alert, ate lunch today  Filed Vitals:   12/20/15 1408 12/20/15 1957 12/21/15 0552 12/21/15 1130  BP: 112/51 117/44 105/44 100/56  Pulse: 85 73 74 70  Temp: 100.5 F (38.1 C) 99 F (37.2 C) 98.3 F (36.8 C)   TempSrc: Oral Oral Oral   Resp: 18 18 16    Height:      Weight:      SpO2: 96% 98% 100%     Inpatient medications: . aspirin EC  325 mg Oral Daily  . [START ON 12/22/2015] aztreonam  1 g Intravenous Q24H  . calcitRIOL  1.5 mcg Oral Q M,W,F  . carvedilol  12.5 mg Oral 2 times per day on Sun Mon Wed Fri   And  . carvedilol  12.5 mg Oral Once per day on Tue Thu Sat  . [START ON 12/29/2015] darbepoetin (ARANESP) injection - DIALYSIS  100 mcg Intravenous Q Thu-HD  . docusate sodium  100 mg Oral Daily  . feeding supplement (NEPRO CARB STEADY)  237 mL Oral BID BM  . [START ON 12/22/2015] ferric gluconate (FERRLECIT/NULECIT) IV  125 mg Intravenous Q T,Th,Sa-HD  . gabapentin  300 mg Oral TID  . mirtazapine  15 mg Oral QHS  . pantoprazole  40 mg Oral Daily  . sevelamer carbonate  2,400 mg Oral TID WC  . sodium chloride flush  3 mL Intravenous Q12H  . [START ON 12/22/2015] vancomycin  500 mg Intravenous Q T,Th,Sa-HD     sodium chloride, acetaminophen **OR** acetaminophen, alum & mag hydroxide-simeth, bisacodyl, glucagon (human recombinant), guaiFENesin-dextromethorphan, lactulose, ondansetron, phenol, sodium chloride flush  Exam: More alert again today, eating/ feeding herself. Still appears weak and tired.  RRR 2/6 sem no RG Chest clear bilat Abd soft ntnd Ext no edema, L AKA wrapped Neuro nonfocal, Ox 2 HD IJ cath R fem / BVT AVF maturing +bruit   2/5 CXR poss early RLL infiltrate  [facility]   4h  64kg  F160   2/2.25 bath  IJ Cath/ maturing AVF   Hep 5000 Calcitriol 1.5 ug Venofer getting bolus  No esa       Assessment: 1 S/P L AKA 12/15/15 2 AMS - due to infection/ PNA/ fevers 3 PNA - fevers  resolving, D#3 IV abx vanc/ aztreonam 4 ESRD TTS HD 5 Anemia / Jehovah's witness - esa on hold, Hb 10 6 MBD started renvela here, not sure home binder 7 HTN on coreg only 8 Vol under dry wt approp after amp 9 Dispo - going to SNF at [facility] - HD Thursday first shift   Kelly Splinter MD [place] pager [phone]    cell [phone] 12/21/2015, 1:48 PM    Recent Labs Lab 12/19/15 0350 12/20/15 0750 12/21/15 0515  NA 134* 135 138  K 4.9 6.5* 3.5  CL 96* 95* 97*  CO2 26 24 29   GLUCOSE 138* 104* 201*  BUN 33* 52* 32*  CREATININE 5.21* 6.70* 4.09*  CALCIUM 8.1* 8.3* 8.4*  PHOS 5.8* 7.9*  --     Recent Labs Lab 12/19/15 0350 12/20/15 0750  ALBUMIN 2.0* 1.9*    Recent Labs Lab 12/18/15 0228 12/19/15 0350 12/20/15 0750  WBC 10.4 11.0* 8.6  NEUTROABS  --   --  5.8  HGB 10.0* 9.5* 9.4*  HCT 31.7* 29.3* 29.4*  MCV 95.2 93.3 93.3  PLT 160 188 232

## 2015-12-22 DIAGNOSIS — D631 Anemia in chronic kidney disease: Secondary | ICD-10-CM | POA: Diagnosis not present

## 2015-12-22 DIAGNOSIS — R2689 Other abnormalities of gait and mobility: Secondary | ICD-10-CM | POA: Diagnosis not present

## 2015-12-22 DIAGNOSIS — N2581 Secondary hyperparathyroidism of renal origin: Secondary | ICD-10-CM | POA: Diagnosis not present

## 2015-12-22 DIAGNOSIS — T82898A Other specified complication of vascular prosthetic devices, implants and grafts, initial encounter: Secondary | ICD-10-CM | POA: Diagnosis not present

## 2015-12-22 DIAGNOSIS — F419 Anxiety disorder, unspecified: Secondary | ICD-10-CM | POA: Diagnosis not present

## 2015-12-22 DIAGNOSIS — H9191 Unspecified hearing loss, right ear: Secondary | ICD-10-CM | POA: Diagnosis not present

## 2015-12-22 DIAGNOSIS — M6281 Muscle weakness (generalized): Secondary | ICD-10-CM | POA: Diagnosis not present

## 2015-12-22 DIAGNOSIS — H9193 Unspecified hearing loss, bilateral: Secondary | ICD-10-CM | POA: Diagnosis not present

## 2015-12-22 DIAGNOSIS — W19XXXA Unspecified fall, initial encounter: Secondary | ICD-10-CM | POA: Diagnosis not present

## 2015-12-22 DIAGNOSIS — M7981 Nontraumatic hematoma of soft tissue: Secondary | ICD-10-CM | POA: Diagnosis not present

## 2015-12-22 DIAGNOSIS — I132 Hypertensive heart and chronic kidney disease with heart failure and with stage 5 chronic kidney disease, or end stage renal disease: Secondary | ICD-10-CM | POA: Diagnosis not present

## 2015-12-22 DIAGNOSIS — Z7901 Long term (current) use of anticoagulants: Secondary | ICD-10-CM | POA: Diagnosis not present

## 2015-12-22 DIAGNOSIS — Z9119 Patient's noncompliance with other medical treatment and regimen: Secondary | ICD-10-CM | POA: Diagnosis not present

## 2015-12-22 DIAGNOSIS — E875 Hyperkalemia: Secondary | ICD-10-CM | POA: Diagnosis not present

## 2015-12-22 DIAGNOSIS — Z4931 Encounter for adequacy testing for hemodialysis: Secondary | ICD-10-CM | POA: Diagnosis not present

## 2015-12-22 DIAGNOSIS — K219 Gastro-esophageal reflux disease without esophagitis: Secondary | ICD-10-CM | POA: Diagnosis not present

## 2015-12-22 DIAGNOSIS — R1312 Dysphagia, oropharyngeal phase: Secondary | ICD-10-CM | POA: Diagnosis not present

## 2015-12-22 DIAGNOSIS — H5203 Hypermetropia, bilateral: Secondary | ICD-10-CM | POA: Diagnosis not present

## 2015-12-22 DIAGNOSIS — F0391 Unspecified dementia with behavioral disturbance: Secondary | ICD-10-CM | POA: Diagnosis not present

## 2015-12-22 DIAGNOSIS — F0390 Unspecified dementia without behavioral disturbance: Secondary | ICD-10-CM | POA: Diagnosis not present

## 2015-12-22 DIAGNOSIS — Z992 Dependence on renal dialysis: Secondary | ICD-10-CM | POA: Diagnosis not present

## 2015-12-22 DIAGNOSIS — F603 Borderline personality disorder: Secondary | ICD-10-CM | POA: Diagnosis not present

## 2015-12-22 DIAGNOSIS — N186 End stage renal disease: Secondary | ICD-10-CM | POA: Diagnosis not present

## 2015-12-22 DIAGNOSIS — D649 Anemia, unspecified: Secondary | ICD-10-CM | POA: Diagnosis not present

## 2015-12-22 DIAGNOSIS — F339 Major depressive disorder, recurrent, unspecified: Secondary | ICD-10-CM | POA: Diagnosis not present

## 2015-12-22 DIAGNOSIS — I120 Hypertensive chronic kidney disease with stage 5 chronic kidney disease or end stage renal disease: Secondary | ICD-10-CM | POA: Diagnosis not present

## 2015-12-22 DIAGNOSIS — E1122 Type 2 diabetes mellitus with diabetic chronic kidney disease: Secondary | ICD-10-CM | POA: Diagnosis not present

## 2015-12-22 DIAGNOSIS — G4700 Insomnia, unspecified: Secondary | ICD-10-CM | POA: Diagnosis not present

## 2015-12-22 DIAGNOSIS — H524 Presbyopia: Secondary | ICD-10-CM | POA: Diagnosis not present

## 2015-12-22 DIAGNOSIS — E119 Type 2 diabetes mellitus without complications: Secondary | ICD-10-CM | POA: Diagnosis not present

## 2015-12-22 DIAGNOSIS — M79601 Pain in right arm: Secondary | ICD-10-CM | POA: Diagnosis not present

## 2015-12-22 DIAGNOSIS — D689 Coagulation defect, unspecified: Secondary | ICD-10-CM | POA: Diagnosis not present

## 2015-12-22 DIAGNOSIS — H5202 Hypermetropia, left eye: Secondary | ICD-10-CM | POA: Diagnosis not present

## 2015-12-22 DIAGNOSIS — T169XXA Foreign body in ear, unspecified ear, initial encounter: Secondary | ICD-10-CM | POA: Diagnosis not present

## 2015-12-22 DIAGNOSIS — Z4781 Encounter for orthopedic aftercare following surgical amputation: Secondary | ICD-10-CM | POA: Diagnosis not present

## 2015-12-22 DIAGNOSIS — Z880 Allergy status to penicillin: Secondary | ICD-10-CM | POA: Diagnosis not present

## 2015-12-22 DIAGNOSIS — G3184 Mild cognitive impairment, so stated: Secondary | ICD-10-CM | POA: Diagnosis not present

## 2015-12-22 DIAGNOSIS — G8928 Other chronic postprocedural pain: Secondary | ICD-10-CM | POA: Diagnosis not present

## 2015-12-22 DIAGNOSIS — Z89619 Acquired absence of unspecified leg above knee: Secondary | ICD-10-CM | POA: Diagnosis not present

## 2015-12-22 DIAGNOSIS — H903 Sensorineural hearing loss, bilateral: Secondary | ICD-10-CM | POA: Diagnosis not present

## 2015-12-22 DIAGNOSIS — Z87891 Personal history of nicotine dependence: Secondary | ICD-10-CM | POA: Diagnosis not present

## 2015-12-22 DIAGNOSIS — H31093 Other chorioretinal scars, bilateral: Secondary | ICD-10-CM | POA: Diagnosis not present

## 2015-12-22 DIAGNOSIS — J181 Lobar pneumonia, unspecified organism: Secondary | ICD-10-CM | POA: Diagnosis not present

## 2015-12-22 DIAGNOSIS — F329 Major depressive disorder, single episode, unspecified: Secondary | ICD-10-CM | POA: Diagnosis not present

## 2015-12-22 DIAGNOSIS — R5381 Other malaise: Secondary | ICD-10-CM | POA: Diagnosis not present

## 2015-12-22 DIAGNOSIS — H6120 Impacted cerumen, unspecified ear: Secondary | ICD-10-CM | POA: Diagnosis not present

## 2015-12-22 DIAGNOSIS — B999 Unspecified infectious disease: Secondary | ICD-10-CM | POA: Diagnosis not present

## 2015-12-22 DIAGNOSIS — L89619 Pressure ulcer of right heel, unspecified stage: Secondary | ICD-10-CM | POA: Diagnosis not present

## 2015-12-22 DIAGNOSIS — Z8673 Personal history of transient ischemic attack (TIA), and cerebral infarction without residual deficits: Secondary | ICD-10-CM | POA: Diagnosis not present

## 2015-12-22 DIAGNOSIS — Z794 Long term (current) use of insulin: Secondary | ICD-10-CM | POA: Diagnosis not present

## 2015-12-22 DIAGNOSIS — I10 Essential (primary) hypertension: Secondary | ICD-10-CM | POA: Diagnosis not present

## 2015-12-22 DIAGNOSIS — I509 Heart failure, unspecified: Secondary | ICD-10-CM | POA: Diagnosis not present

## 2015-12-22 DIAGNOSIS — Z961 Presence of intraocular lens: Secondary | ICD-10-CM | POA: Diagnosis not present

## 2015-12-22 DIAGNOSIS — N185 Chronic kidney disease, stage 5: Secondary | ICD-10-CM | POA: Diagnosis not present

## 2015-12-22 DIAGNOSIS — Z7982 Long term (current) use of aspirin: Secondary | ICD-10-CM | POA: Diagnosis not present

## 2015-12-22 DIAGNOSIS — I739 Peripheral vascular disease, unspecified: Secondary | ICD-10-CM | POA: Diagnosis not present

## 2015-12-22 DIAGNOSIS — D509 Iron deficiency anemia, unspecified: Secondary | ICD-10-CM | POA: Diagnosis not present

## 2015-12-22 DIAGNOSIS — Z89612 Acquired absence of left leg above knee: Secondary | ICD-10-CM | POA: Diagnosis not present

## 2015-12-22 LAB — CBC
HCT: 27.5 % — ABNORMAL LOW (ref 36.0–46.0)
Hemoglobin: 8.8 g/dL — ABNORMAL LOW (ref 12.0–15.0)
MCH: 29.9 pg (ref 26.0–34.0)
MCHC: 32 g/dL (ref 30.0–36.0)
MCV: 93.5 fL (ref 78.0–100.0)
PLATELETS: 266 10*3/uL (ref 150–400)
RBC: 2.94 MIL/uL — ABNORMAL LOW (ref 3.87–5.11)
RDW: 16.4 % — AB (ref 11.5–15.5)
WBC: 7.5 10*3/uL (ref 4.0–10.5)

## 2015-12-22 LAB — CULTURE, BLOOD (SINGLE)
CULTURE: NO GROWTH
CULTURE: NO GROWTH

## 2015-12-22 LAB — BASIC METABOLIC PANEL
ANION GAP: 12 (ref 5–15)
BUN: 52 mg/dL — ABNORMAL HIGH (ref 6–20)
CALCIUM: 8.5 mg/dL — AB (ref 8.9–10.3)
CO2: 29 mmol/L (ref 22–32)
Chloride: 95 mmol/L — ABNORMAL LOW (ref 101–111)
Creatinine, Ser: 5.45 mg/dL — ABNORMAL HIGH (ref 0.44–1.00)
GFR, EST AFRICAN AMERICAN: 8 mL/min — AB (ref >60)
GFR, EST NON AFRICAN AMERICAN: 7 mL/min — AB (ref >60)
GLUCOSE: 160 mg/dL — AB (ref 65–99)
Potassium: 5.3 mmol/L — ABNORMAL HIGH (ref 3.5–5.1)
SODIUM: 136 mmol/L (ref 135–145)

## 2015-12-22 LAB — GLUCOSE, CAPILLARY: GLUCOSE-CAPILLARY: 180 mg/dL — AB (ref 65–99)

## 2015-12-22 MED ORDER — VANCOMYCIN HCL IN DEXTROSE 500-5 MG/100ML-% IV SOLN
INTRAVENOUS | Status: AC
  Administered 2015-12-22: 500 mg
  Filled 2015-12-22: qty 100

## 2015-12-22 MED ORDER — PENTAFLUOROPROP-TETRAFLUOROETH EX AERO: 1.0000 "application " | INHALATION_SPRAY | CUTANEOUS | Status: DC | PRN

## 2015-12-22 MED ORDER — HEPARIN SODIUM (PORCINE) 1000 UNIT/ML DIALYSIS: 2000.0000 [IU] | INTRAMUSCULAR | Status: DC | PRN

## 2015-12-22 MED ORDER — ALTEPLASE 2 MG IJ SOLR
2.0000 mg | Freq: Once | INTRAMUSCULAR | Status: DC | PRN
  Filled 2015-12-22: qty 2

## 2015-12-22 MED ORDER — APIXABAN 2.5 MG PO TABS
2.5000 mg | ORAL_TABLET | Freq: Two times a day (BID) | ORAL | Status: DC
  Administered 2015-12-22: 2.5 mg via ORAL
  Filled 2015-12-22 (×3): qty 1

## 2015-12-22 MED ORDER — LIDOCAINE HCL (PF) 1 % IJ SOLN: 5.0000 mL | INTRAMUSCULAR | Status: DC | PRN

## 2015-12-22 MED ORDER — SODIUM CHLORIDE 0.9 % IV SOLN: 100.0000 mL | INTRAVENOUS | Status: DC | PRN

## 2015-12-22 MED ORDER — LEVOFLOXACIN 500 MG PO TABS: 500.0000 mg | ORAL_TABLET | ORAL | Status: AC

## 2015-12-22 MED ORDER — HEPARIN SODIUM (PORCINE) 1000 UNIT/ML DIALYSIS: 1000.0000 [IU] | INTRAMUSCULAR | Status: DC | PRN

## 2015-12-22 MED ORDER — OXYCODONE HCL 5 MG PO TABS: 5.0000 mg | ORAL_TABLET | Freq: Four times a day (QID) | ORAL | Status: DC | PRN

## 2015-12-22 MED ORDER — LEVOFLOXACIN 500 MG PO TABS: 500.0000 mg | ORAL_TABLET | ORAL | Status: DC

## 2015-12-22 MED ORDER — LIDOCAINE-PRILOCAINE 2.5-2.5 % EX CREA: 1.0000 "application " | TOPICAL_CREAM | CUTANEOUS | Status: DC | PRN

## 2015-12-22 NOTE — Progress Notes (Signed)
Utilization review completed.  

## 2015-12-22 NOTE — Progress Notes (Signed)
Physical Therapy Treatment Patient Details Name: Devaeh Zorger MRN: [id number] DOB: Jan 15, 1946 Today's Date: 12/22/2015    History of Present Illness pt is a 70 y/o female with h/o DM, HTN, critical lowerlimb ischemia, stroke, PVD, CHF admitted with L LE gangrenous wounds.  pt s/p L AKA due to no viable revascularization options.    PT Comments    Progressing slowly, but participative whether on HD days or not.  Follow Up Recommendations  SNF     Equipment Recommendations  None recommended by PT    Recommendations for Other Services       Precautions / Restrictions Precautions Precautions: Fall Restrictions Weight Bearing Restrictions: Yes    Mobility  Bed Mobility Overal bed mobility: Needs Assistance Bed Mobility: Sit to Supine     Supine to sit: Max assist Sit to supine: +2 for safety/equipment      Transfers                    Ambulation/Gait             General Gait Details: NA   Stairs            Wheelchair Mobility    Modified Rankin (Stroke Patients Only)       Balance Overall balance assessment: Needs assistance Sitting-balance support: Feet supported Sitting balance-Leahy Scale: Poor Sitting balance - Comments: Still listing mildly L, but after balance work EOB can correct to midline.  Completed 10 LAQ's without LOB                            Cognition Arousal/Alertness: Lethargic Behavior During Therapy: Flat affect Overall Cognitive Status: History of cognitive impairments - at baseline                      Exercises General Exercises - Lower Extremity Hip ABduction/ADduction: AAROM;Strengthening;Left;10 reps;Supine Straight Leg Raises: AROM;Strengthening;Left;10 reps;Supine Amputee Exercises Quad Sets: AAROM;Left;5 reps;Supine Hip Extension: AAROM;Left;10 reps;Supine Hip ABduction/ADduction: Left;10 reps;Supine;AAROM Hip Flexion/Marching: Left;10 reps;Sidelying;AAROM    General Comments  General comments (skin integrity, edema, etc.): Reinforced progression of activity and therapy with daughters so they can advocate for pt and SNF      Pertinent Vitals/Pain Pain Assessment: Faces Faces Pain Scale: Hurts little more Pain Location: left stump Pain Descriptors / Indicators: Sore    Home Living                      Prior Function            PT Goals (current goals can now be found in the care plan section) Acute Rehab PT Goals Patient Stated Goal: I want to work toward a leg...pt's daughter want pt to be able to help with transfers bed to chair. PT Goal Formulation: With patient Time For Goal Achievement: 01/02/16 Potential to Achieve Goals: Good Progress towards PT goals: Progressing toward goals    Frequency  Min 2X/week    PT Plan Current plan remains appropriate    Co-evaluation             End of Session   Activity Tolerance: Patient tolerated treatment well Patient left: in bed;with call bell/phone within reach;Other (comment) (for transport to [facility])     Time: [id number] PT Time Calculation (min) (ACUTE ONLY): 21 min  Charges:  $Therapeutic Activity: 8-22 mins  G Codes:      Nasim Habeeb, Tessie Fass 12/22/2015, 12:42 PM 12/22/2015  Donnella Sham, PT [phone]  (pager)

## 2015-12-22 NOTE — Progress Notes (Signed)
[facility] Progress Note  Assessment/Plan: 1. S/P L AKA 12/15/15 per [facility]/  2 AMS - due to infection/ PNA/ fevers 3 PNA - fevers resolving, D#3 IV abx vanc/ aztreonam. CANNOT GIVE AZTREONAM in HD UNITS. Discussed with pharmacy-will give Azactam today then Levoquin 500 mg PO tomorrow to complete 7 day course.  4 ESRD TTS HD [facility]. On HD today. K+ 5.3.  5 Anemia / Jehovah's witness - esa on hold, Hb 8.8. Give ESA today. No Fe while on antibiotics.  6 MBD started renvela here, not sure home binder Ca 8.5 C Ca 10.18 Phos 7.9 7 HTN on coreg only 8 Vol under dry wt approp after amp 9 Dispo - going to SNF at dc, hopefully today. Will have dose of Levoquin 500 mg PO tomorrow.   Rita H. Brown NP-C 12/22/2015, 9:14 AM  [facility] [phone]  Pt seen, examined and agree w A/P as above.  Kelly Splinter MD [facility] pager [phone]    cell [phone] 12/22/2015, 11:05 AM    Subjective:  "I'm cramping". On HD, sleeping, difficult to arouse. Oriented to person and place.   Objective Filed Vitals:   12/22/15 0745 12/22/15 0755 12/22/15 0800 12/22/15 0832  BP: 126/59 124/62 130/61 90/49  Pulse: 73 71 71 73  Temp: 97.4 F (36.3 C) 97.4 F (36.3 C)    TempSrc: Oral Oral    Resp: 12 10    Height:      Weight:  61.8 kg (136 lb 3.9 oz)    SpO2: 93% 100%     Physical Exam General: chronically ill appearing female in NAD Heart: S1, S2, II/VI M Lungs: Bilateral breath sounds CTA Abdomen: active BS non-distended Extremities: No LE edema L BKA drsg CDI.  Dialysis Access: perm cath R femoral drsg CDI.   Dialysis Orders: Dialysis: [facility] 4h 64kg F160 2/2.25 bath IJ Cath/ maturing AVF Hep 5000 Calcitriol 1.5 ug Venofer getting bolus  No esa  Additional Objective Labs: Basic Metabolic Panel:  Recent Labs Lab 12/19/15 0350 12/20/15 0750 12/21/15 0515 12/22/15 0820  NA 134* 135 138 136  K 4.9 6.5* 3.5 5.3*  CL 96* 95*  97* 95*  CO2 26 24 29 29   GLUCOSE 138* 104* 201* 160*  BUN 33* 52* 32* 52*  CREATININE 5.21* 6.70* 4.09* 5.45*  CALCIUM 8.1* 8.3* 8.4* 8.5*  PHOS 5.8* 7.9*  --   --    Liver Function Tests:  Recent Labs Lab 12/19/15 0350 12/20/15 0750  ALBUMIN 2.0* 1.9*   No results for input(s): LIPASE, AMYLASE in the last 168 hours. CBC:  Recent Labs Lab 12/17/15 0241 12/18/15 0228 12/19/15 0350 12/20/15 0750 12/22/15 0820  WBC 10.6* 10.4 11.0* 8.6 7.5  NEUTROABS  --   --   --  5.8  --   HGB 10.1* 10.0* 9.5* 9.4* 8.8*  HCT 31.4* 31.7* 29.3* 29.4* 27.5*  MCV 94.0 95.2 93.3 93.3 93.5  PLT 190 160 188 232 266   Blood Culture    Component Value Date/Time   SDES BLOOD LEFT HAND 12/18/2015 1200   SPECREQUEST BOTTLES DRAWN AEROBIC AND ANAEROBIC 10CCS 12/18/2015 1200   CULT NO GROWTH 3 DAYS 12/18/2015 1200   REPTSTATUS PENDING 12/18/2015 1200    Cardiac Enzymes: No results for input(s): CKTOTAL, CKMB, CKMBINDEX, TROPONINI in the last 168 hours. CBG:  Recent Labs Lab 12/21/15 0550 12/21/15 1115 12/21/15 1628 12/21/15 2140 12/22/15 0609  GLUCAP 179* 110* 244* 221* 180*   Iron Studies:  No results for input(s): IRON, TIBC, TRANSFERRIN, FERRITIN in the last 72 hours. @lablastinr3 @ Studies/Results: No results found. Medications:   . aspirin EC  325 mg Oral Daily  . aztreonam  1 g Intravenous Q24H  . calcitRIOL  1.5 mcg Oral Q M,W,F  . carvedilol  12.5 mg Oral 2 times per day on Sun Mon Wed Fri   And  . carvedilol  12.5 mg Oral Once per day on Tue Thu Sat  . [START ON 12/29/2015] darbepoetin (ARANESP) injection - DIALYSIS  100 mcg Intravenous Q Thu-HD  . docusate sodium  100 mg Oral Daily  . feeding supplement (NEPRO CARB STEADY)  237 mL Oral BID BM  . ferric gluconate (FERRLECIT/NULECIT) IV  125 mg Intravenous Q T,Th,Sa-HD  . gabapentin  300 mg Oral TID  . mirtazapine  15 mg Oral QHS  . pantoprazole  40 mg Oral Daily  . sevelamer carbonate  2,400 mg Oral TID WC  . sodium  chloride flush  3 mL Intravenous Q12H  . vancomycin  500 mg Intravenous Q T,Th,Sa-HD  . vancomycin

## 2015-12-22 NOTE — Progress Notes (Signed)
  Progress Note    12/22/2015 11:36 AM 7 Days Post-Op  Subjective:  Upset that she hasn't seen Dr. Trula Slade  Afebrile x 24 hrs VSS   Filed Vitals:   12/22/15 1100 12/22/15 1131  BP: 132/60 124/85  Pulse: 80 81  Temp:    Resp:      Physical Exam: Incisions:  Healing nicely with staples in tact.   CBC    Component Value Date/Time   WBC 7.5 12/22/2015 0820   RBC 2.94* 12/22/2015 0820   HGB 8.8* 12/22/2015 0820   HCT 27.5* 12/22/2015 0820   PLT 266 12/22/2015 0820   MCV 93.5 12/22/2015 0820   MCH 29.9 12/22/2015 0820   MCHC 32.0 12/22/2015 0820   RDW 16.4* 12/22/2015 0820   LYMPHSABS 1.7 12/20/2015 0750   MONOABS 0.8 12/20/2015 0750   EOSABS 0.3 12/20/2015 0750   BASOSABS 0.0 12/20/2015 0750    BMET    Component Value Date/Time   NA 136 12/22/2015 0820   K 5.3* 12/22/2015 0820   CL 95* 12/22/2015 0820   CO2 29 12/22/2015 0820   GLUCOSE 160* 12/22/2015 0820   BUN 52* 12/22/2015 0820   CREATININE 5.45* 12/22/2015 0820   CALCIUM 8.5* 12/22/2015 0820   GFRNONAA 7* 12/22/2015 0820   GFRAA 8* 12/22/2015 0820    INR No results found for: INR   Intake/Output Summary (Last 24 hours) at 12/22/15 1136 Last data filed at 12/22/15 0934  Gross per 24 hour  Intake    440 ml  Output      0 ml  Net    440 ml     Assessment/Plan:  70 y.o. female is s/p left above knee amputation  7 Days Post-Op  -stump is viable -pt has been afebrile x 24hrs -Pt will receive last dose of vanc and aztreonam today.  She will get one more dose of Levaquin tomorrow to complete 7 day course. -will d/c today and Dr. Trula Slade will see pt back in 3 weeks to get staples out.   Leontine Locket, PA-C [facility] [phone] 12/22/2015 11:36 AM

## 2015-12-22 NOTE — Care Management Important Message (Signed)
Important Message  Patient Details  Name: Deborah Hendricks MRN: [id number] Date of Birth: 05/27/1946   Medicare Important Message Given:  Yes    Loann Quill 12/22/2015, 8:07 AM

## 2015-12-22 NOTE — Progress Notes (Signed)
Report called to RN at [employer]. All questions answered.   Fritz Pickerel, RN

## 2015-12-22 NOTE — Progress Notes (Signed)
Patient will discharge to [facility] Anticipated discharge date:2/9 Family notified: daughters Transportation by [facility]- scheduled for 1pm  CSW signing off.  Domenica Reamer, [facility] Social Worker [phone]

## 2015-12-22 NOTE — Care Management Note (Signed)
Case Management Note Previous CM note initiated by Brandt Loosen RN CM  Patient Details  Name: Deborah Hendricks MRN: [id number] Date of Birth: 1946/01/09  Subjective/Objective:  70 y.o. F admitted for Confusion, fever from [facility]. Will return to SNF at discharge.                   Action/Plan: Anticipate discharge home Thursday after Early HD. Discharge is deferred to [place]. No further CM needs but will be available should additional discharge needs arise.   Expected Discharge Date:    12/22/15              Expected Discharge Plan:  Skilled Nursing Facility  In-House Referral:  Clinical Social Work  Discharge planning Services  CM Consult  Post Acute Care Choice:    Choice offered to:     DME Arranged:    DME Agency:     HH Arranged:    [place] Agency:     Status of Service:  Completed, signed off  Medicare Important Message Given:  Yes Date Medicare IM Given:    Medicare IM give by:    Date Additional Medicare IM Given:    Additional Medicare Important Message give by:     If discussed at [place] of Stay Meetings, dates discussed:  12/22/15  Discharge Disposition: skilled facility   Additional Comments:  Dawayne Patricia, RN 12/22/2015, 1:37 PM

## 2015-12-22 NOTE — Discharge Summary (Signed)
Discharge Summary    Deborah Hendricks [birth year] 70 y.o. female  [id number]  Admission Date: 12/15/2015  Discharge Date: 12/22/15  Physician: Serafina Mitchell, MD  Admission Diagnosis: PVD   HPI:   This is a 70 y.o. female is here today for evaluation of a left foot ulcer. She is well known to me having undergone first stage right basilic vein transposition approximately 2 weeks ago. She is a hemodialysis patient on Tuesday Thursday Saturday. She has had a wound on her left first and fifth toe for a few months. She underwent angiography by Dr. Gwenlyn Found and does not have any options for revascularization. Her wounds are getting worse and she was sent over for discussions of amputation. The patient has not walked since last summer  The patient suffers from diabetes which has not been well controlled. She is medically managed for hypertension. She has a history of stroke. She has been on Coumadin in the past but is now on Plavix.  Hospital Course:  The patient was admitted to the hospital and taken to the operating room on 12/15/2015 and underwent: Left above knee amputation.    The pt tolerated the procedure well and was transported to the PACU in good condition.   On POD 2, the pt became confused and lethargic.  Blood cx were drawn and did not grow out anything.  She did reach a Tm of 103.  She was placed on IV ABx.   She was kept in the hospital as well.  There was some concern that her TDC could be infected.  This has continued to look good and all incisions are healing nicely.  Her temperature curve has trended downward and she has been afebrile > 24hrs at discharge.   She did have a cxr that revealed decreased aeration of teh RLL with possible infiltrate and was started on a 10d  Course of Levaquin. She will have one more dose on 12/23/15 to finish a 10 day course.   She did have a swallow study on 12/19/15:  Pt demonstrates no signs of aspiration upon subjective evaluation and no  clinical indicators that put her at risk of silent aspiration. Pt is drowsy, but able to masticate well and swallow liquids consecutively. Discussed basic precautions with daughters and RN. Recommend pt resume regular diet and thin liquids. No SLP f/u warranted unless clinical concern for aspiration increases. In that case, would proceed with MBS.   Diet Recommendation Regular;Thin liquid   Liquid Administration via: Cup;Straw Medication Administration: Whole meds with liquid Supervision: Staff to assist with self feeding Compensations: Slow rate;Small sips/bites Postural Changes: Seated upright at 90 degrees;Remain upright for at least 30 minutes after po intake         The remainder of the hospital course consisted of increasing mobilization and increasing intake of solids without difficulty.  CBC    Component Value Date/Time   WBC 7.5 12/22/2015 0820   RBC 2.94* 12/22/2015 0820   HGB 8.8* 12/22/2015 0820   HCT 27.5* 12/22/2015 0820   PLT 266 12/22/2015 0820   MCV 93.5 12/22/2015 0820   MCH 29.9 12/22/2015 0820   MCHC 32.0 12/22/2015 0820   RDW 16.4* 12/22/2015 0820   LYMPHSABS 1.7 12/20/2015 0750   MONOABS 0.8 12/20/2015 0750   EOSABS 0.3 12/20/2015 0750   BASOSABS 0.0 12/20/2015 0750    BMET    Component Value Date/Time   NA 136 12/22/2015 0820   K 5.3* 12/22/2015 0820   CL 95* 12/22/2015 0820  CO2 29 12/22/2015 0820   GLUCOSE 160* 12/22/2015 0820   BUN 52* 12/22/2015 0820   CREATININE 5.45* 12/22/2015 0820   CALCIUM 8.5* 12/22/2015 0820   GFRNONAA 7* 12/22/2015 0820   GFRAA 8* 12/22/2015 0820      Discharge Instructions    Activity as tolerated - No restrictions    Complete by:  As directed      Call MD for:  redness, tenderness, or signs of infection (pain, swelling, bleeding, redness, odor or green/yellow discharge around incision site)    Complete by:  As directed      Call MD for:  redness, tenderness, or signs of infection (pain, swelling,  bleeding, redness, odor or green/yellow discharge around incision site)    Complete by:  As directed      Call MD for:  severe or increased pain, loss or decreased feeling  in affected limb(s)    Complete by:  As directed      Call MD for:  severe or increased pain, loss or decreased feeling  in affected limb(s)    Complete by:  As directed      Call MD for:  temperature >100.5    Complete by:  As directed      Call MD for:  temperature >100.5    Complete by:  As directed      Discharge instructions    Complete by:  As directed   May shower dry dressing to left stump until no drainage.     Discharge wound care:    Complete by:  As directed   Wash stump daily with soap and water.  Replace retention sock daily after washing with soap and water.     Resume previous diet    Complete by:  As directed      Resume previous diet    Complete by:  As directed            Discharge Diagnosis:  PVD  Secondary Diagnosis: Patient Active Problem List   Diagnosis Date Noted  . PAD (peripheral artery disease) ([place]) 12/15/2015  . Critical lower limb ischemia 11/16/2015  . Type 2 diabetes mellitus with left diabetic foot ulcer ([place]) 10/21/2015  . Type II diabetes mellitus with neurological manifestations ([place]) 10/21/2015   Past Medical History  Diagnosis Date  . Diabetes mellitus without complication ([place])   . Hypertension   . Anemia   . Critical lower limb ischemia   . Stroke ([place]) 04/2015    unable to stand .  can have diffuuclty with speech at times  . Seizures ([place]) 04/2015    since   . Kidney disease     ESRD  . Depression   . Anxiety   . Constipation   . Memory loss     since stroke 04/2015  . Peripheral vascular disease ([place])   . CHF (congestive heart failure) ([place])        Medication List    TAKE these medications        aspirin EC 325 MG tablet  Take 325 mg by mouth daily.     carvedilol 12.5 MG tablet  Commonly known as:  COREG  Take 12.5 mg by mouth every  Tuesday, Thursday, and Saturday at 6 PM.     carvedilol 12.5 MG tablet  Commonly known as:  COREG  Take 12.5 mg by mouth See admin instructions. Take 12.5 mg every sun, mon, wed, and fri twice daily     ELIQUIS 5 MG Tabs  tablet  Generic drug:  apixaban  Take 5 mg by mouth daily.     esomeprazole 20 MG capsule  Commonly known as:  NEXIUM  Take 20 mg by mouth daily at 12 noon.     feeding supplement (NEPRO CARB STEADY) Liqd  Take 237 mLs by mouth 2 (two) times daily between meals.     gabapentin 300 MG capsule  Commonly known as:  NEURONTIN  Take 300 mg by mouth 3 (three) times daily.     glucagon 1 MG injection  Inject 1 mg into the vein once as needed (hypoglycemia).     insulin aspart 100 UNIT/ML injection  Commonly known as:  novoLOG  Inject 2-10 Units into the skin 3 (three) times daily before meals. 151-200 = 2 units, 201-250 = 3 units, 251-300 = 4 unit, 301-350 = 6 units, 351-400 = 8 units, 401-999 = 10 units     insulin detemir 100 UNIT/ML injection  Commonly known as:  LEVEMIR  Inject 18 Units into the skin daily.     lactulose 10 GM/15ML solution  Commonly known as:  CHRONULAC  Take 20 g by mouth 2 (two) times daily as needed for mild constipation.     levofloxacin 500 MG tablet  Commonly known as:  LEVAQUIN  Take 1 tablet (500 mg total) by mouth every other day.  Start taking on:  12/23/2015     LORazepam 0.5 MG tablet  Commonly known as:  ATIVAN  Take 0.5 mg by mouth at bedtime.     magnesium oxide 400 MG tablet  Commonly known as:  MAG-OX  Take 400 mg by mouth daily.     Melatonin 3 MG Caps  Take 3 mg by mouth.     mirtazapine 15 MG tablet  Commonly known as:  REMERON  Take 15 mg by mouth at bedtime.     ondansetron 4 MG tablet  Commonly known as:  ZOFRAN  Take 4 mg by mouth every 8 (eight) hours as needed for nausea or vomiting.     oxyCODONE-acetaminophen 5-325 MG tablet  Commonly known as:  PERCOCET/ROXICET  Take 1-2 tablets by mouth every 4  (four) hours as needed for moderate pain or severe pain.        Prescriptions given: Percocet  Levaquin 500mg  x 1 dose on 12/23/15 to finish 10 day course for PNA.  Instructions: 1.  Wash stump daily with soap and water  Disposition: SNF  Patient's condition: is Good  Follow up: 1. Dr. Trula Slade in 2 weeks   Leontine Locket, PA-C [facility] [phone] 12/22/2015  11:47 AM

## 2015-12-23 DIAGNOSIS — Z9119 Patient's noncompliance with other medical treatment and regimen: Secondary | ICD-10-CM | POA: Diagnosis not present

## 2015-12-23 DIAGNOSIS — E119 Type 2 diabetes mellitus without complications: Secondary | ICD-10-CM | POA: Diagnosis not present

## 2015-12-23 LAB — CULTURE, BLOOD (SINGLE)
CULTURE: NO GROWTH
Culture: NO GROWTH

## 2015-12-24 DIAGNOSIS — D509 Iron deficiency anemia, unspecified: Secondary | ICD-10-CM | POA: Diagnosis not present

## 2015-12-24 DIAGNOSIS — D631 Anemia in chronic kidney disease: Secondary | ICD-10-CM | POA: Diagnosis not present

## 2015-12-24 DIAGNOSIS — N2581 Secondary hyperparathyroidism of renal origin: Secondary | ICD-10-CM | POA: Diagnosis not present

## 2015-12-24 DIAGNOSIS — N186 End stage renal disease: Secondary | ICD-10-CM | POA: Diagnosis not present

## 2015-12-26 ENCOUNTER — Telehealth: Payer: Self-pay | Admitting: Vascular Surgery

## 2015-12-26 NOTE — Telephone Encounter (Signed)
-----   Message from Angelia Mould, MD sent at 12/24/2015 11:20 AM EST ----- Regarding: phone call This patient had arm swelling. She had a first stage BVT by VWB. I told her to go to the ED for a duplex to r/o DVT. CD

## 2015-12-27 DIAGNOSIS — D509 Iron deficiency anemia, unspecified: Secondary | ICD-10-CM | POA: Diagnosis not present

## 2015-12-27 DIAGNOSIS — N2581 Secondary hyperparathyroidism of renal origin: Secondary | ICD-10-CM | POA: Diagnosis not present

## 2015-12-27 DIAGNOSIS — D631 Anemia in chronic kidney disease: Secondary | ICD-10-CM | POA: Diagnosis not present

## 2015-12-27 DIAGNOSIS — N186 End stage renal disease: Secondary | ICD-10-CM | POA: Diagnosis not present

## 2015-12-29 DIAGNOSIS — N186 End stage renal disease: Secondary | ICD-10-CM | POA: Diagnosis not present

## 2015-12-29 DIAGNOSIS — D509 Iron deficiency anemia, unspecified: Secondary | ICD-10-CM | POA: Diagnosis not present

## 2015-12-29 DIAGNOSIS — D631 Anemia in chronic kidney disease: Secondary | ICD-10-CM | POA: Diagnosis not present

## 2015-12-29 DIAGNOSIS — N2581 Secondary hyperparathyroidism of renal origin: Secondary | ICD-10-CM | POA: Diagnosis not present

## 2015-12-31 DIAGNOSIS — D631 Anemia in chronic kidney disease: Secondary | ICD-10-CM | POA: Diagnosis not present

## 2015-12-31 DIAGNOSIS — D509 Iron deficiency anemia, unspecified: Secondary | ICD-10-CM | POA: Diagnosis not present

## 2015-12-31 DIAGNOSIS — N186 End stage renal disease: Secondary | ICD-10-CM | POA: Diagnosis not present

## 2015-12-31 DIAGNOSIS — N2581 Secondary hyperparathyroidism of renal origin: Secondary | ICD-10-CM | POA: Diagnosis not present

## 2016-01-03 DIAGNOSIS — D509 Iron deficiency anemia, unspecified: Secondary | ICD-10-CM | POA: Diagnosis not present

## 2016-01-03 DIAGNOSIS — N186 End stage renal disease: Secondary | ICD-10-CM | POA: Diagnosis not present

## 2016-01-03 DIAGNOSIS — N2581 Secondary hyperparathyroidism of renal origin: Secondary | ICD-10-CM | POA: Diagnosis not present

## 2016-01-03 DIAGNOSIS — D631 Anemia in chronic kidney disease: Secondary | ICD-10-CM | POA: Diagnosis not present

## 2016-01-04 ENCOUNTER — Encounter: Payer: Self-pay | Admitting: Surgery

## 2016-01-05 DIAGNOSIS — E119 Type 2 diabetes mellitus without complications: Secondary | ICD-10-CM | POA: Diagnosis not present

## 2016-01-05 DIAGNOSIS — D631 Anemia in chronic kidney disease: Secondary | ICD-10-CM | POA: Diagnosis not present

## 2016-01-05 DIAGNOSIS — D689 Coagulation defect, unspecified: Secondary | ICD-10-CM | POA: Diagnosis not present

## 2016-01-05 DIAGNOSIS — N186 End stage renal disease: Secondary | ICD-10-CM | POA: Diagnosis not present

## 2016-01-07 DIAGNOSIS — D631 Anemia in chronic kidney disease: Secondary | ICD-10-CM | POA: Diagnosis not present

## 2016-01-07 DIAGNOSIS — D689 Coagulation defect, unspecified: Secondary | ICD-10-CM | POA: Diagnosis not present

## 2016-01-07 DIAGNOSIS — E119 Type 2 diabetes mellitus without complications: Secondary | ICD-10-CM | POA: Diagnosis not present

## 2016-01-07 DIAGNOSIS — N186 End stage renal disease: Secondary | ICD-10-CM | POA: Diagnosis not present

## 2016-01-09 ENCOUNTER — Ambulatory Visit (HOSPITAL_COMMUNITY)
Admission: RE | Admit: 2016-01-09 | Discharge: 2016-01-09 | Disposition: A | Discharge status: Home / Self Care | Payer: Medicare Other | Source: Ambulatory Visit | Attending: Surgery | Admitting: Surgery

## 2016-01-09 ENCOUNTER — Ambulatory Visit (INDEPENDENT_AMBULATORY_CARE_PROVIDER_SITE_OTHER): Payer: Medicare Other | Admitting: Surgery

## 2016-01-09 ENCOUNTER — Other Ambulatory Visit: Payer: Self-pay

## 2016-01-09 DIAGNOSIS — I132 Hypertensive heart and chronic kidney disease with heart failure and with stage 5 chronic kidney disease, or end stage renal disease: Secondary | ICD-10-CM | POA: Diagnosis not present

## 2016-01-09 DIAGNOSIS — E1122 Type 2 diabetes mellitus with diabetic chronic kidney disease: Secondary | ICD-10-CM | POA: Insufficient documentation

## 2016-01-09 DIAGNOSIS — F329 Major depressive disorder, single episode, unspecified: Secondary | ICD-10-CM | POA: Diagnosis not present

## 2016-01-09 DIAGNOSIS — N186 End stage renal disease: Secondary | ICD-10-CM

## 2016-01-09 DIAGNOSIS — I509 Heart failure, unspecified: Secondary | ICD-10-CM | POA: Diagnosis not present

## 2016-01-09 DIAGNOSIS — F419 Anxiety disorder, unspecified: Secondary | ICD-10-CM | POA: Diagnosis not present

## 2016-01-09 DIAGNOSIS — Z4931 Encounter for adequacy testing for hemodialysis: Secondary | ICD-10-CM

## 2016-01-09 DIAGNOSIS — F0390 Unspecified dementia without behavioral disturbance: Secondary | ICD-10-CM | POA: Diagnosis not present

## 2016-01-09 NOTE — Progress Notes (Signed)
Patient name: Deborah Hendricks MRN: [id number] DOB: [birth year] Sex: female    No chief complaint on file.   HISTORY OF PRESENT ILLNESS:  the patient is back for follow-up.  On 11/25/2015 she underwent a right first stage basilic vein transposition. She reports no  Symptoms consistent with steal syndrome.  The patient also underwent a left above-knee amputation on 12/15/2015.  She denies any phantom pain.  She is eager to get fitted for prosthesis.  Past Medical History  Diagnosis Date  . Diabetes mellitus without complication ([place])   . Hypertension   . Anemia   . Critical lower limb ischemia   . Stroke ([place]) 04/2015    unable to stand .  can have diffuuclty with speech at times  . Seizures ([place]) 04/2015    since   . Kidney disease     ESRD  . Depression   . Anxiety   . Constipation   . Memory loss     since stroke 04/2015  . Peripheral vascular disease ([place])   . CHF (congestive heart failure) [facility])     Past Surgical History  Procedure Laterality Date  . Toe amputation      R foot  . Abdominal hysterectomy    . Tonsillectomy    . Bascilic vein transposition Right 11/25/2015    Procedure: Right Arm FIRST STAGE BASILIC VEIN TRANSPOSITION;  Surgeon: Serafina Mitchell, MD;  Location: [place];  Service: Vascular;  Laterality: Right;  . Peripheral vascular catheterization Bilateral 11/28/2015    Procedure: Lower Extremity Angiography;  Surgeon: Lorretta Harp, MD;  Location: [place] CV LAB;  Service: Cardiovascular;  Laterality: Bilateral;  . Peripheral vascular catheterization N/A 11/28/2015    Procedure: Abdominal Aortogram;  Surgeon: Lorretta Harp, MD;  Location: [place] [facility];  Service: Cardiovascular;  Laterality: N/A;  . Amputation Left 12/15/2015    Procedure: LEFT ABOVE KNEE AMPUTATION;  Surgeon: Serafina Mitchell, MD;  Location: [facility] OR;  Service: Vascular;  Laterality: Left;    Social History   Social History  . Marital Status: Single    Spouse Name: N/A  .  Number of Children: N/A  . Years of Education: N/A   Occupational History  . Not on file.   Social History Main Topics  . Smoking status: Former Smoker    Quit date: 11/17/2013  . Smokeless tobacco: Never Used  . Alcohol Use: No  . Drug Use: No  . Sexual Activity: Not on file   Other Topics Concern  . Not on file   Social History Narrative    Family History  Problem Relation Age of Onset  . Asthma Mother   . Cancer Sister     Allergies as of 01/09/2016 - Review Complete 12/15/2015  Allergen Reaction Noted  . Amoxicillin  11/24/2015  . Latex  11/24/2015  . Sulfa antibiotics  11/24/2015  . Penicillins Hives, Itching, and Rash 01/28/2014    Current Outpatient Prescriptions on File Prior to Visit  Medication Sig Dispense Refill  . apixaban (ELIQUIS) 5 MG TABS tablet Take 5 mg by mouth daily.    [name] aspirin EC 325 MG tablet Take 325 mg by mouth daily.    . carvedilol (COREG) 12.5 MG tablet Take 12.5 mg by mouth every Tuesday, Thursday, and Saturday at 6 PM.    . carvedilol (COREG) 12.5 MG tablet Take 12.5 mg by mouth See admin instructions. Take 12.5 mg every sun, mon, wed, and fri twice daily    .  esomeprazole (NEXIUM) 20 MG capsule Take 20 mg by mouth daily at 12 noon.    . gabapentin (NEURONTIN) 300 MG capsule Take 300 mg by mouth 3 (three) times daily.    [name] glucagon 1 MG injection Inject 1 mg into the vein once as needed (hypoglycemia).    . insulin aspart (NOVOLOG) 100 UNIT/ML injection Inject 2-10 Units into the skin 3 (three) times daily before meals. 151-200 = 2 units, 201-250 = 3 units, 251-300 = 4 unit, 301-350 = 6 units, 351-400 = 8 units, 401-999 = 10 units    . insulin detemir (LEVEMIR) 100 UNIT/ML injection Inject 18 Units into the skin daily.    [name] lactulose (CHRONULAC) 10 GM/15ML solution Take 20 g by mouth 2 (two) times daily as needed for mild constipation.    [name] LORazepam (ATIVAN) 0.5 MG tablet Take 0.5 mg by mouth at bedtime.    . magnesium oxide (MAG-OX) 400  MG tablet Take 400 mg by mouth daily.    . Melatonin 3 MG CAPS Take 3 mg by mouth.    . mirtazapine (REMERON) 15 MG tablet Take 15 mg by mouth at bedtime.    . Nutritional Supplements (FEEDING SUPPLEMENT, NEPRO CARB STEADY,) LIQD Take 237 mLs by mouth 2 (two) times daily between meals.    . ondansetron (ZOFRAN) 4 MG tablet Take 4 mg by mouth every 8 (eight) hours as needed for nausea or vomiting.    [name] oxyCODONE-acetaminophen (PERCOCET/ROXICET) 5-325 MG tablet Take 1-2 tablets by mouth every 4 (four) hours as needed for moderate pain or severe pain. 30 tablet 0   No current facility-administered medications on file prior to visit.       PHYSICAL EXAMINATION:   Vital signs are There were no vitals filed for this visit. There is no weight on file to calculate BMI. General: The patient appears their stated age.  excellent thrill within right basilic vein fistula.  The incision has healed.   Left above-knee amputation is healing nicely.  Staples remain intact.  No skin openings.  No drainage   Diagnostic Studies  I have reviewed her  Fistula duplex.  The vein measures from 0.70-0.96 cm.  Assessment:  #1: Left above-knee amputation #2:  Right first stage basilic vein transposition Plan:  #1: Staples will be removed today and Steri-Strips placed. Her wound has healed very nicely.  #2: we will schedule her for a second stage right basilic vein transposition.  She'll need to be off of her Eliquis.  I discussed the details of the procedure.  She will require probably one more month after the procedure before she can have her catheter removed.  Her procedure has been scheduled for Friday, March 3  V. Leia Alf, M.D. [facility] [place] Office: [phone] Pager:  [phone]

## 2016-01-10 ENCOUNTER — Telehealth: Payer: Self-pay | Admitting: Vascular Surgery

## 2016-01-10 DIAGNOSIS — N186 End stage renal disease: Secondary | ICD-10-CM | POA: Diagnosis not present

## 2016-01-10 DIAGNOSIS — E1122 Type 2 diabetes mellitus with diabetic chronic kidney disease: Secondary | ICD-10-CM | POA: Diagnosis not present

## 2016-01-10 DIAGNOSIS — E119 Type 2 diabetes mellitus without complications: Secondary | ICD-10-CM | POA: Diagnosis not present

## 2016-01-10 DIAGNOSIS — Z992 Dependence on renal dialysis: Secondary | ICD-10-CM | POA: Diagnosis not present

## 2016-01-10 DIAGNOSIS — D631 Anemia in chronic kidney disease: Secondary | ICD-10-CM | POA: Diagnosis not present

## 2016-01-10 DIAGNOSIS — D689 Coagulation defect, unspecified: Secondary | ICD-10-CM | POA: Diagnosis not present

## 2016-01-10 NOTE — Telephone Encounter (Addendum)
Script taken care of. Spoke to pt's daughter POA, she is picking it up.  ----- Message from Elam Dutch, MD sent at 01/09/2016  3:24 PM EST ----- Regarding: RE: script for a board  Yes just put it on my desk for Thursday if I need to sign it  Ruta Hinds ----- Message -----    From: Georgiann Mccoy    Sent: 01/09/2016   2:48 PM      To: Elam Dutch, MD Subject: script for a board                             Hi Dr. Kellie Simmering,  This pt's daughter wants to know if you can write a script for a "board" to help with transition from the ground to the car.  Thank you, Selinda Eon

## 2016-01-11 DIAGNOSIS — H6120 Impacted cerumen, unspecified ear: Secondary | ICD-10-CM | POA: Diagnosis not present

## 2016-01-11 DIAGNOSIS — H524 Presbyopia: Secondary | ICD-10-CM | POA: Diagnosis not present

## 2016-01-11 DIAGNOSIS — H9191 Unspecified hearing loss, right ear: Secondary | ICD-10-CM | POA: Diagnosis not present

## 2016-01-12 DIAGNOSIS — N2581 Secondary hyperparathyroidism of renal origin: Secondary | ICD-10-CM | POA: Diagnosis not present

## 2016-01-12 DIAGNOSIS — N186 End stage renal disease: Secondary | ICD-10-CM | POA: Diagnosis not present

## 2016-01-12 DIAGNOSIS — E119 Type 2 diabetes mellitus without complications: Secondary | ICD-10-CM | POA: Diagnosis not present

## 2016-01-12 DIAGNOSIS — D631 Anemia in chronic kidney disease: Secondary | ICD-10-CM | POA: Diagnosis not present

## 2016-01-14 DIAGNOSIS — N2581 Secondary hyperparathyroidism of renal origin: Secondary | ICD-10-CM | POA: Diagnosis not present

## 2016-01-14 DIAGNOSIS — N186 End stage renal disease: Secondary | ICD-10-CM | POA: Diagnosis not present

## 2016-01-14 DIAGNOSIS — D631 Anemia in chronic kidney disease: Secondary | ICD-10-CM | POA: Diagnosis not present

## 2016-01-14 DIAGNOSIS — E119 Type 2 diabetes mellitus without complications: Secondary | ICD-10-CM | POA: Diagnosis not present

## 2016-01-16 ENCOUNTER — Encounter: Payer: Self-pay | Admitting: Surgery

## 2016-01-16 DIAGNOSIS — Z9119 Patient's noncompliance with other medical treatment and regimen: Secondary | ICD-10-CM | POA: Diagnosis not present

## 2016-01-16 DIAGNOSIS — E119 Type 2 diabetes mellitus without complications: Secondary | ICD-10-CM | POA: Diagnosis not present

## 2016-01-16 DIAGNOSIS — F0391 Unspecified dementia with behavioral disturbance: Secondary | ICD-10-CM | POA: Diagnosis not present

## 2016-01-16 DIAGNOSIS — L89619 Pressure ulcer of right heel, unspecified stage: Secondary | ICD-10-CM | POA: Diagnosis not present

## 2016-01-16 DIAGNOSIS — F603 Borderline personality disorder: Secondary | ICD-10-CM | POA: Diagnosis not present

## 2016-01-17 ENCOUNTER — Encounter: Payer: Self-pay | Admitting: Surgery

## 2016-01-17 DIAGNOSIS — N186 End stage renal disease: Secondary | ICD-10-CM | POA: Diagnosis not present

## 2016-01-17 DIAGNOSIS — E119 Type 2 diabetes mellitus without complications: Secondary | ICD-10-CM | POA: Diagnosis not present

## 2016-01-17 DIAGNOSIS — N2581 Secondary hyperparathyroidism of renal origin: Secondary | ICD-10-CM | POA: Diagnosis not present

## 2016-01-17 DIAGNOSIS — D631 Anemia in chronic kidney disease: Secondary | ICD-10-CM | POA: Diagnosis not present

## 2016-01-18 ENCOUNTER — Encounter (HOSPITAL_COMMUNITY): Payer: Self-pay | Admitting: *Deleted

## 2016-01-18 DIAGNOSIS — M6281 Muscle weakness (generalized): Secondary | ICD-10-CM | POA: Diagnosis not present

## 2016-01-18 DIAGNOSIS — R2689 Other abnormalities of gait and mobility: Secondary | ICD-10-CM | POA: Diagnosis not present

## 2016-01-18 DIAGNOSIS — H9193 Unspecified hearing loss, bilateral: Secondary | ICD-10-CM | POA: Diagnosis not present

## 2016-01-18 DIAGNOSIS — H6120 Impacted cerumen, unspecified ear: Secondary | ICD-10-CM | POA: Diagnosis not present

## 2016-01-18 DIAGNOSIS — G3184 Mild cognitive impairment, so stated: Secondary | ICD-10-CM | POA: Diagnosis not present

## 2016-01-18 DIAGNOSIS — R5381 Other malaise: Secondary | ICD-10-CM | POA: Diagnosis not present

## 2016-01-18 DIAGNOSIS — T169XXA Foreign body in ear, unspecified ear, initial encounter: Secondary | ICD-10-CM | POA: Diagnosis not present

## 2016-01-18 MED ORDER — SODIUM CHLORIDE 0.9 % IV SOLN
INTRAVENOUS | Status: DC
  Administered 2016-01-19: 09:00:00 via INTRAVENOUS

## 2016-01-18 MED ORDER — VANCOMYCIN HCL IN DEXTROSE 1-5 GM/200ML-% IV SOLN
1000.0000 mg | INTRAVENOUS | Status: AC
  Administered 2016-01-19: 1000 mg via INTRAVENOUS
  Filled 2016-01-18: qty 200

## 2016-01-18 NOTE — Progress Notes (Signed)
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 She states pt has not c/o any recent chest pain or sob. States her fasting blood sugar has been between 100-120. Instructed Chrys Racer that pt is to have 9 units (80%) of Levemir Insulin in the AM and if her blood sugar is 220 or higher to give her 1/2 of her correction dose of Novolog. If pt's blood sugar is 70 or less, instructed her to use the glucagon that they have available. These instructions given per Diabetes Medication Adjustment Guidelines Prior to Procedure and Surgery. Chrys Racer voiced understanding. Chrys Racer reports last dose of Eliquis was 01/16/16.

## 2016-01-19 ENCOUNTER — Ambulatory Visit (HOSPITAL_COMMUNITY): Payer: Medicare Other | Admitting: Anesthesiology

## 2016-01-19 ENCOUNTER — Encounter (HOSPITAL_COMMUNITY): Payer: Self-pay | Admitting: *Deleted

## 2016-01-19 ENCOUNTER — Ambulatory Visit (HOSPITAL_COMMUNITY)
Admission: RE | Admit: 2016-01-19 | Discharge: 2016-01-19 | Disposition: A | Discharge status: Skilled Nursing Facility | Payer: Medicare Other | Source: Ambulatory Visit | Attending: Surgery | Admitting: Surgery

## 2016-01-19 ENCOUNTER — Encounter (HOSPITAL_COMMUNITY)
Admission: RE | Disposition: A | Discharge status: Skilled Nursing Facility | Payer: Self-pay | Source: Ambulatory Visit | Attending: Surgery

## 2016-01-19 DIAGNOSIS — N186 End stage renal disease: Secondary | ICD-10-CM | POA: Diagnosis not present

## 2016-01-19 DIAGNOSIS — Z87891 Personal history of nicotine dependence: Secondary | ICD-10-CM | POA: Insufficient documentation

## 2016-01-19 DIAGNOSIS — D649 Anemia, unspecified: Secondary | ICD-10-CM | POA: Diagnosis not present

## 2016-01-19 DIAGNOSIS — Z880 Allergy status to penicillin: Secondary | ICD-10-CM | POA: Insufficient documentation

## 2016-01-19 DIAGNOSIS — I739 Peripheral vascular disease, unspecified: Secondary | ICD-10-CM | POA: Diagnosis not present

## 2016-01-19 DIAGNOSIS — I120 Hypertensive chronic kidney disease with stage 5 chronic kidney disease or end stage renal disease: Secondary | ICD-10-CM | POA: Diagnosis not present

## 2016-01-19 DIAGNOSIS — Z89612 Acquired absence of left leg above knee: Secondary | ICD-10-CM | POA: Insufficient documentation

## 2016-01-19 DIAGNOSIS — Z992 Dependence on renal dialysis: Secondary | ICD-10-CM | POA: Diagnosis not present

## 2016-01-19 DIAGNOSIS — Z8673 Personal history of transient ischemic attack (TIA), and cerebral infarction without residual deficits: Secondary | ICD-10-CM | POA: Insufficient documentation

## 2016-01-19 DIAGNOSIS — N185 Chronic kidney disease, stage 5: Secondary | ICD-10-CM | POA: Diagnosis not present

## 2016-01-19 DIAGNOSIS — E1122 Type 2 diabetes mellitus with diabetic chronic kidney disease: Secondary | ICD-10-CM | POA: Insufficient documentation

## 2016-01-19 DIAGNOSIS — Z7982 Long term (current) use of aspirin: Secondary | ICD-10-CM | POA: Diagnosis not present

## 2016-01-19 DIAGNOSIS — Z7901 Long term (current) use of anticoagulants: Secondary | ICD-10-CM | POA: Insufficient documentation

## 2016-01-19 DIAGNOSIS — Z794 Long term (current) use of insulin: Secondary | ICD-10-CM | POA: Insufficient documentation

## 2016-01-19 DIAGNOSIS — T82898A Other specified complication of vascular prosthetic devices, implants and grafts, initial encounter: Secondary | ICD-10-CM | POA: Diagnosis not present

## 2016-01-19 HISTORY — PX: BASCILIC VEIN TRANSPOSITION: SHX5742

## 2016-01-19 LAB — GLUCOSE, CAPILLARY
GLUCOSE-CAPILLARY: 118 mg/dL — AB (ref 65–99)
GLUCOSE-CAPILLARY: 119 mg/dL — AB (ref 65–99)

## 2016-01-19 LAB — PROTIME-INR
INR: 1.23 (ref 0.00–1.49)
Prothrombin Time: 15.7 seconds — ABNORMAL HIGH (ref 11.6–15.2)

## 2016-01-19 LAB — POCT I-STAT 4, (NA,K, GLUC, HGB,HCT)
GLUCOSE: 130 mg/dL — AB (ref 65–99)
HEMATOCRIT: 38 % (ref 36.0–46.0)
HEMOGLOBIN: 12.9 g/dL (ref 12.0–15.0)
Potassium: 4.1 mmol/L (ref 3.5–5.1)
Sodium: 133 mmol/L — ABNORMAL LOW (ref 135–145)

## 2016-01-19 SURGERY — TRANSPOSITION, VEIN, BASILIC
Anesthesia: General | Site: Arm Upper | Laterality: Right

## 2016-01-19 MED ORDER — ACETAMINOPHEN 325 MG PO TABS: 325.0000 mg | ORAL_TABLET | ORAL | Status: DC | PRN

## 2016-01-19 MED ORDER — HEPARIN SODIUM (PORCINE) 1000 UNIT/ML IJ SOLN
INTRAMUSCULAR | Status: AC
  Filled 2016-01-19: qty 1

## 2016-01-19 MED ORDER — HEPARIN SODIUM (PORCINE) 1000 UNIT/ML IJ SOLN
INTRAMUSCULAR | Status: DC | PRN
  Administered 2016-01-19: 3000 [IU] via INTRAVENOUS

## 2016-01-19 MED ORDER — OXYCODONE HCL 5 MG PO TABS
5.0000 mg | ORAL_TABLET | Freq: Once | ORAL | Status: AC | PRN
  Administered 2016-01-19: 5 mg via ORAL

## 2016-01-19 MED ORDER — OXYCODONE HCL 5 MG PO TABS
ORAL_TABLET | ORAL | Status: AC
  Filled 2016-01-19: qty 1

## 2016-01-19 MED ORDER — FENTANYL CITRATE (PF) 100 MCG/2ML IJ SOLN
INTRAMUSCULAR | Status: DC | PRN
  Administered 2016-01-19: 100 ug via INTRAVENOUS

## 2016-01-19 MED ORDER — OXYCODONE HCL 5 MG PO TABS: 5.0000 mg | ORAL_TABLET | Freq: Four times a day (QID) | ORAL | Status: DC | PRN

## 2016-01-19 MED ORDER — PROTAMINE SULFATE 10 MG/ML IV SOLN
INTRAVENOUS | Status: DC | PRN
  Administered 2016-01-19: 15 mg via INTRAVENOUS
  Administered 2016-01-19: 10 mg via INTRAVENOUS

## 2016-01-19 MED ORDER — HYDRALAZINE HCL 20 MG/ML IJ SOLN
INTRAMUSCULAR | Status: AC
  Filled 2016-01-19: qty 1

## 2016-01-19 MED ORDER — PROPOFOL 10 MG/ML IV BOLUS
INTRAVENOUS | Status: AC
  Filled 2016-01-19: qty 20

## 2016-01-19 MED ORDER — LIDOCAINE-EPINEPHRINE (PF) 1 %-1:200000 IJ SOLN
INTRAMUSCULAR | Status: AC
  Filled 2016-01-19: qty 30

## 2016-01-19 MED ORDER — ONDANSETRON HCL 4 MG/2ML IJ SOLN
INTRAMUSCULAR | Status: DC | PRN
  Administered 2016-01-19: 4 mg via INTRAVENOUS

## 2016-01-19 MED ORDER — EPHEDRINE SULFATE 50 MG/ML IJ SOLN
INTRAMUSCULAR | Status: DC | PRN
  Administered 2016-01-19: 10 mg via INTRAVENOUS

## 2016-01-19 MED ORDER — FENTANYL CITRATE (PF) 100 MCG/2ML IJ SOLN
25.0000 ug | INTRAMUSCULAR | Status: DC | PRN
  Administered 2016-01-19: 25 ug via INTRAVENOUS

## 2016-01-19 MED ORDER — HYDRALAZINE HCL 20 MG/ML IJ SOLN: 5.0000 mg | Freq: Once | INTRAMUSCULAR | Status: DC

## 2016-01-19 MED ORDER — CHLORHEXIDINE GLUCONATE CLOTH 2 % EX PADS
6.0000 | MEDICATED_PAD | Freq: Once | CUTANEOUS | Status: DC
Start: 2016-01-19 — End: 2016-01-19

## 2016-01-19 MED ORDER — 0.9 % SODIUM CHLORIDE (POUR BTL) OPTIME
TOPICAL | Status: DC | PRN
  Administered 2016-01-19: 1000 mL

## 2016-01-19 MED ORDER — PROPOFOL 10 MG/ML IV BOLUS
INTRAVENOUS | Status: DC | PRN
  Administered 2016-01-19: 160 mg via INTRAVENOUS

## 2016-01-19 MED ORDER — MIDAZOLAM HCL 2 MG/2ML IJ SOLN
INTRAMUSCULAR | Status: AC
  Filled 2016-01-19: qty 2

## 2016-01-19 MED ORDER — PHENYLEPHRINE HCL 10 MG/ML IJ SOLN
INTRAMUSCULAR | Status: DC | PRN
  Administered 2016-01-19 (×3): 80 ug via INTRAVENOUS

## 2016-01-19 MED ORDER — HEMOSTATIC AGENTS (NO CHARGE) OPTIME
TOPICAL | Status: DC | PRN
  Administered 2016-01-19: 1 via TOPICAL

## 2016-01-19 MED ORDER — OXYCODONE HCL 5 MG/5ML PO SOLN: 5.0000 mg | Freq: Once | ORAL | Status: AC | PRN

## 2016-01-19 MED ORDER — PROTAMINE SULFATE 10 MG/ML IV SOLN
INTRAVENOUS | Status: AC
  Filled 2016-01-19: qty 5

## 2016-01-19 MED ORDER — FENTANYL CITRATE (PF) 250 MCG/5ML IJ SOLN
INTRAMUSCULAR | Status: AC
  Filled 2016-01-19: qty 5

## 2016-01-19 MED ORDER — HEPARIN SODIUM (PORCINE) 5000 UNIT/ML IJ SOLN
INTRAMUSCULAR | Status: DC | PRN
  Administered 2016-01-19: 500 mL

## 2016-01-19 MED ORDER — ONDANSETRON HCL 4 MG/2ML IJ SOLN
INTRAMUSCULAR | Status: AC
  Filled 2016-01-19: qty 2

## 2016-01-19 MED ORDER — FENTANYL CITRATE (PF) 100 MCG/2ML IJ SOLN
INTRAMUSCULAR | Status: AC
  Filled 2016-01-19: qty 2

## 2016-01-19 MED ORDER — LIDOCAINE HCL (CARDIAC) 20 MG/ML IV SOLN
INTRAVENOUS | Status: DC | PRN
  Administered 2016-01-19: 100 mg via INTRAVENOUS

## 2016-01-19 MED ORDER — ACETAMINOPHEN 160 MG/5ML PO SOLN
325.0000 mg | ORAL | Status: DC | PRN
  Filled 2016-01-19: qty 20.3

## 2016-01-19 SURGICAL SUPPLY — 38 items
CANISTER SUCTION 2500CC (MISCELLANEOUS) ×3 IMPLANT
CATH EMB 4FR 40CM (CATHETERS) ×3 IMPLANT
CLIP TI MEDIUM 24 (CLIP) IMPLANT
CLIP TI MEDIUM 6 (CLIP) IMPLANT
CLIP TI WIDE RED SMALL 24 (CLIP) IMPLANT
CLIP TI WIDE RED SMALL 6 (CLIP) IMPLANT
COVER PROBE W GEL 5X96 (DRAPES) ×3 IMPLANT
ELECT REM PT RETURN 9FT ADLT (ELECTROSURGICAL) ×3
ELECTRODE REM PT RTRN 9FT ADLT (ELECTROSURGICAL) ×1 IMPLANT
GLOVE BIOGEL PI IND STRL 6.5 (GLOVE) ×2 IMPLANT
GLOVE BIOGEL PI IND STRL 7.0 (GLOVE) ×1 IMPLANT
GLOVE BIOGEL PI IND STRL 7.5 (GLOVE) ×1 IMPLANT
GLOVE BIOGEL PI INDICATOR 6.5 (GLOVE) ×4
GLOVE BIOGEL PI INDICATOR 7.0 (GLOVE) ×2
GLOVE BIOGEL PI INDICATOR 7.5 (GLOVE) ×2
GLOVE SURG SS PI 6.5 STRL IVOR (GLOVE) ×3 IMPLANT
GLOVE SURG SS PI 7.0 STRL IVOR (GLOVE) ×3 IMPLANT
GLOVE SURG SS PI 7.5 STRL IVOR (GLOVE) ×3 IMPLANT
GOWN STRL REUS W/ TWL LRG LVL3 (GOWN DISPOSABLE) ×2 IMPLANT
GOWN STRL REUS W/ TWL XL LVL3 (GOWN DISPOSABLE) ×1 IMPLANT
GOWN STRL REUS W/TWL LRG LVL3 (GOWN DISPOSABLE) ×4
GOWN STRL REUS W/TWL XL LVL3 (GOWN DISPOSABLE) ×2
HEMOSTAT SNOW SURGICEL 2X4 (HEMOSTASIS) ×3 IMPLANT
KIT BASIN OR (CUSTOM PROCEDURE TRAY) ×3 IMPLANT
KIT ROOM TURNOVER OR (KITS) ×3 IMPLANT
LIQUID BAND (GAUZE/BANDAGES/DRESSINGS) ×3 IMPLANT
NS IRRIG 1000ML POUR BTL (IV SOLUTION) ×3 IMPLANT
PACK CV ACCESS (CUSTOM PROCEDURE TRAY) ×3 IMPLANT
PAD ARMBOARD 7.5X6 YLW CONV (MISCELLANEOUS) ×6 IMPLANT
SPONGE LAP 18X18 X RAY DECT (DISPOSABLE) ×3 IMPLANT
SUT PROLENE 5 0 C 1 24 (SUTURE) ×3 IMPLANT
SUT PROLENE 6 0 CC (SUTURE) ×3 IMPLANT
SUT SILK 2 0 SH (SUTURE) ×3 IMPLANT
SUT VIC AB 3-0 SH 27 (SUTURE) ×4
SUT VIC AB 3-0 SH 27X BRD (SUTURE) ×2 IMPLANT
SUT VICRYL 4-0 PS2 18IN ABS (SUTURE) ×6 IMPLANT
UNDERPAD 30X30 INCONTINENT (UNDERPADS AND DIAPERS) ×3 IMPLANT
WATER STERILE IRR 1000ML POUR (IV SOLUTION) ×3 IMPLANT

## 2016-01-19 NOTE — Progress Notes (Signed)
Hemodialysis will be done tomorrow per Maryjo Rochester, charge nurse at [facility].

## 2016-01-19 NOTE — Op Note (Signed)
    Patient name: Deborah Hendricks MRN: [id number] DOB: 01/12/1946 Sex: female  01/19/2016 Pre-operative Diagnosis: ESRD Post-operative diagnosis:  Same Surgeon:  [name] Assistants:  Leontine Locket Procedure:   #1:  Second stage basilic vein transposition with branch ligation, elevation and division of the basilic vein with primary anastomosis Anesthesia:   Gen. Blood Loss:  See anesthesia record Specimens:   None  Findings:   Vein dilated nicely  Indications:   The patient is here for second stage basilic vein transposition  Procedure:  The patient was identified in the holding area and taken to [place] [facility]  The patient was then placed supine on the table. general anesthesia was administered.  The patient was prepped and draped in the usual sterile fashion.  A time out was called and antibiotics were administered.    Ultrasound was used to evaluate the basilic vein.  It was of appropriate diameter.  A #10 blade was used to make 2 longitudinal incisions in the upper arm.  Through this incision the vein was dissected free and circumferentially mobilized, ligating side branches between silk ties.  Neuro lysis was performed.  The nerve did wrap around the vein which required me to divide the vein.  I gave 3000 units of heparin.  I marked the vein for orientation and then occluded with vascular clamps.  The vein was dilated.  It was then brought anterior to the nerve.  A primary end to end anastomosis was performed with running 5-0 Prolene.  Next 25 mg of protamine was given.  Hemostasis was achieved.  The subcutaneous tissue was then closed posterior to the fistula with interrupted 3-0 Vicryl.  The skin was closed with 4-0 Vicryl directly anterior to the fistula.  Dermabond was applied.  No immediate complications.   Disposition:  To PACU in stable condition.   Theotis Burrow, M.D. [facility] [place] Office: [phone] Pager:  [phone]

## 2016-01-19 NOTE — Transfer of Care (Signed)
Immediate Anesthesia Transfer of Care Note  Patient: Deborah Hendricks  Procedure(s) Performed: Procedure(s): RIGHT SECOND STAGE BASILIC VEIN TRANSPOSITION (Right)  Patient Location: PACU  Anesthesia Type:General  Level of Consciousness: awake, alert  and patient cooperative  Airway & Oxygen Therapy: Patient Spontanous Breathing and Patient connected to nasal cannula oxygen  Post-op Assessment: Report given to RN and Post -op Vital signs reviewed and stable  Post vital signs: Reviewed and stable  Last Vitals:  Filed Vitals:   01/19/16 0806  BP: 169/72  Pulse: 65  Temp: 36.8 C  Resp: 20    Complications: No apparent anesthesia complications

## 2016-01-19 NOTE — H&P (View-Only) (Signed)
Patient name: Deborah Hendricks MRN: [id number] DOB: [birth year] Sex: female    No chief complaint on file.   HISTORY OF PRESENT ILLNESS:  the patient is back for follow-up.  On 11/25/2015 she underwent a right first stage basilic vein transposition. She reports no  Symptoms consistent with steal syndrome.  The patient also underwent a left above-knee amputation on 12/15/2015.  She denies any phantom pain.  She is eager to get fitted for prosthesis.  Past Medical History  Diagnosis Date  . Diabetes mellitus without complication ([place])   . Hypertension   . Anemia   . Critical lower limb ischemia   . Stroke ([place]) 04/2015    unable to stand .  can have diffuuclty with speech at times  . Seizures ([place]) 04/2015    since   . Kidney disease     ESRD  . Depression   . Anxiety   . Constipation   . Memory loss     since stroke 04/2015  . Peripheral vascular disease ([place])   . CHF (congestive heart failure) [facility])     Past Surgical History  Procedure Laterality Date  . Toe amputation      R foot  . Abdominal hysterectomy    . Tonsillectomy    . Bascilic vein transposition Right 11/25/2015    Procedure: Right Arm FIRST STAGE BASILIC VEIN TRANSPOSITION;  Surgeon: Serafina Mitchell, MD;  Location: [place];  Service: Vascular;  Laterality: Right;  . Peripheral vascular catheterization Bilateral 11/28/2015    Procedure: Lower Extremity Angiography;  Surgeon: Lorretta Harp, MD;  Location: [place] [facility];  Service: Cardiovascular;  Laterality: Bilateral;  . Peripheral vascular catheterization N/A 11/28/2015    Procedure: Abdominal Aortogram;  Surgeon: Lorretta Harp, MD;  Location: [place] [facility];  Service: Cardiovascular;  Laterality: N/A;  . Amputation Left 12/15/2015    Procedure: LEFT ABOVE KNEE AMPUTATION;  Surgeon: Serafina Mitchell, MD;  Location: [facility] OR;  Service: Vascular;  Laterality: Left;    Social History   Social History  . Marital Status: Single    Spouse Name: N/A  .  Number of Children: N/A  . Years of Education: N/A   Occupational History  . Not on file.   Social History Main Topics  . Smoking status: Former Smoker    Quit date: 11/17/2013  . Smokeless tobacco: Never Used  . Alcohol Use: No  . Drug Use: No  . Sexual Activity: Not on file   Other Topics Concern  . Not on file   Social History Narrative    Family History  Problem Relation Age of Onset  . Asthma Mother   . Cancer Sister     Allergies as of 01/09/2016 - Review Complete 12/15/2015  Allergen Reaction Noted  . Amoxicillin  11/24/2015  . Latex  11/24/2015  . Sulfa antibiotics  11/24/2015  . Penicillins Hives, Itching, and Rash 01/28/2014    Current Outpatient Prescriptions on File Prior to Visit  Medication Sig Dispense Refill  . apixaban (ELIQUIS) 5 MG TABS tablet Take 5 mg by mouth daily.    [name] aspirin EC 325 MG tablet Take 325 mg by mouth daily.    . carvedilol (COREG) 12.5 MG tablet Take 12.5 mg by mouth every Tuesday, Thursday, and Saturday at 6 PM.    . carvedilol (COREG) 12.5 MG tablet Take 12.5 mg by mouth See admin instructions. Take 12.5 mg every sun, mon, wed, and fri twice daily    .  esomeprazole (NEXIUM) 20 MG capsule Take 20 mg by mouth daily at 12 noon.    . gabapentin (NEURONTIN) 300 MG capsule Take 300 mg by mouth 3 (three) times daily.    [name] glucagon 1 MG injection Inject 1 mg into the vein once as needed (hypoglycemia).    . insulin aspart (NOVOLOG) 100 UNIT/ML injection Inject 2-10 Units into the skin 3 (three) times daily before meals. 151-200 = 2 units, 201-250 = 3 units, 251-300 = 4 unit, 301-350 = 6 units, 351-400 = 8 units, 401-999 = 10 units    . insulin detemir (LEVEMIR) 100 UNIT/ML injection Inject 18 Units into the skin daily.    [name] lactulose (CHRONULAC) 10 GM/15ML solution Take 20 g by mouth 2 (two) times daily as needed for mild constipation.    [name] LORazepam (ATIVAN) 0.5 MG tablet Take 0.5 mg by mouth at bedtime.    . magnesium oxide (MAG-OX) 400  MG tablet Take 400 mg by mouth daily.    . Melatonin 3 MG CAPS Take 3 mg by mouth.    . mirtazapine (REMERON) 15 MG tablet Take 15 mg by mouth at bedtime.    . Nutritional Supplements (FEEDING SUPPLEMENT, NEPRO CARB STEADY,) LIQD Take 237 mLs by mouth 2 (two) times daily between meals.    . ondansetron (ZOFRAN) 4 MG tablet Take 4 mg by mouth every 8 (eight) hours as needed for nausea or vomiting.    [name] oxyCODONE-acetaminophen (PERCOCET/ROXICET) 5-325 MG tablet Take 1-2 tablets by mouth every 4 (four) hours as needed for moderate pain or severe pain. 30 tablet 0   No current facility-administered medications on file prior to visit.       PHYSICAL EXAMINATION:   Vital signs are There were no vitals filed for this visit. There is no weight on file to calculate BMI. General: The patient appears their stated age.  excellent thrill within right basilic vein fistula.  The incision has healed.   Left above-knee amputation is healing nicely.  Staples remain intact.  No skin openings.  No drainage   Diagnostic Studies  I have reviewed her  Fistula duplex.  The vein measures from 0.70-0.96 cm.  Assessment:  #1: Left above-knee amputation #2:  Right first stage basilic vein transposition Plan:  #1: Staples will be removed today and Steri-Strips placed. Her wound has healed very nicely.  #2: we will schedule her for a second stage right basilic vein transposition.  She'll need to be off of her Eliquis.  I discussed the details of the procedure.  She will require probably one more month after the procedure before she can have her catheter removed.  Her procedure has been scheduled for Friday, March 3  V. Leia Alf, M.D. [facility] [place] Office: [phone] Pager:  [phone]

## 2016-01-19 NOTE — Interval H&P Note (Signed)
History and Physical Interval Note:  01/19/2016 10:09 AM  Deborah Hendricks  has presented today for surgery, with the diagnosis of End Stage Renal Disease N18.6  The various methods of treatment have been discussed with the patient and family. After consideration of risks, benefits and other options for treatment, the patient has consented to  Procedure(s): SECOND STAGE BASILIC VEIN TRANSPOSITION (Right) as a surgical intervention .  The patient's history has been reviewed, patient examined, no change in status, stable for surgery.  I have reviewed the patient's chart and labs.  Questions were answered to the patient's satisfaction.     [name]

## 2016-01-19 NOTE — Anesthesia Procedure Notes (Signed)
Procedure Name: LMA Insertion Date/Time: 01/19/2016 10:51 AM Performed by: Mariea Clonts Pre-anesthesia Checklist: Patient identified, Patient being monitored, Emergency Drugs available, Timeout performed and Suction available Patient Re-evaluated:Patient Re-evaluated prior to inductionOxygen Delivery Method: Circle system utilized Preoxygenation: Pre-oxygenation with 100% oxygen Intubation Type: IV induction Ventilation: Mask ventilation without difficulty LMA: LMA inserted LMA Size: 4.0 Number of attempts: 1 Tube secured with: Tape Dental Injury: Teeth and Oropharynx as per pre-operative assessment

## 2016-01-19 NOTE — Anesthesia Postprocedure Evaluation (Signed)
Anesthesia Post Note  Patient: Deborah Hendricks  Procedure(s) Performed: Procedure(s) (LRB): RIGHT SECOND STAGE BASILIC VEIN TRANSPOSITION (Right)  Patient location during evaluation: PACU Anesthesia Type: General Level of consciousness: sedated and patient cooperative Pain management: pain level controlled Vital Signs Assessment: post-procedure vital signs reviewed and stable Respiratory status: spontaneous breathing Cardiovascular status: stable Anesthetic complications: no    Last Vitals:  Filed Vitals:   01/19/16 1400 01/19/16 1408  BP:  139/73  Pulse: 70 71  Temp: 36.4 C   Resp: 12 10    Last Pain:  Filed Vitals:   01/19/16 1409  PainSc: [place]

## 2016-01-19 NOTE — Discharge Instructions (Signed)
° ° °  01/19/2016 Deborah Hendricks [id number] 1946/06/11  Surgeon(s): Serafina Mitchell, MD  Procedure(s): RIGHT SECOND STAGE BASILIC VEIN TRANSPOSITION  x Do not stick fistula for 8 weeks

## 2016-01-19 NOTE — Anesthesia Preprocedure Evaluation (Signed)
Anesthesia Evaluation  Patient identified by MRN, date of birth, ID band Patient awake    Reviewed: Allergy & Precautions, NPO status , Patient's Chart, lab work & pertinent test results  Airway Mallampati: III  TM Distance: >3 FB Neck ROM: Full    Dental  (+) Chipped   Pulmonary former smoker,    Pulmonary exam normal breath sounds clear to auscultation       Cardiovascular hypertension, Pt. on medications and Pt. on home beta blockers + Peripheral Vascular Disease and +CHF  Normal cardiovascular exam Rhythm:Regular Rate:Normal  Echo 04/01/12 [facility] in [facility]):  1. Normal LV size and function, overall EF 60% 2. Biatrial enlargement 3. Mild pulmonary HTN   Neuro/Psych Seizures -, Well Controlled,  PSYCHIATRIC DISORDERS Anxiety Depression CVA    GI/Hepatic negative GI ROS, Neg liver ROS,   Endo/Other  diabetes  Renal/GU DialysisRenal disease  negative genitourinary   Musculoskeletal negative musculoskeletal ROS (+)   Abdominal   Peds negative pediatric ROS (+)  Hematology  (+) [facility]  Anesthesia Other Findings   Reproductive/Obstetrics negative OB ROS                             Anesthesia Physical Anesthesia Plan  ASA: III  Anesthesia Plan: General   Post-op Pain Management:    Induction: Intravenous  Airway Management Planned: LMA  Additional Equipment: None  Intra-op Plan:   Post-operative Plan: Extubation in OR  Informed Consent: I have reviewed the patients History and Physical, chart, labs and discussed the procedure including the risks, benefits and alternatives for the proposed anesthesia with the patient or authorized representative who has indicated his/her understanding and acceptance.   Dental advisory given  Plan Discussed with: CRNA and Surgeon  Anesthesia Plan Comments:         Anesthesia Quick Evaluation

## 2016-01-20 ENCOUNTER — Encounter (HOSPITAL_COMMUNITY): Payer: Self-pay | Admitting: Surgery

## 2016-01-21 DIAGNOSIS — N186 End stage renal disease: Secondary | ICD-10-CM | POA: Diagnosis not present

## 2016-01-21 DIAGNOSIS — N2581 Secondary hyperparathyroidism of renal origin: Secondary | ICD-10-CM | POA: Diagnosis not present

## 2016-01-21 DIAGNOSIS — E119 Type 2 diabetes mellitus without complications: Secondary | ICD-10-CM | POA: Diagnosis not present

## 2016-01-21 DIAGNOSIS — D631 Anemia in chronic kidney disease: Secondary | ICD-10-CM | POA: Diagnosis not present

## 2016-01-23 ENCOUNTER — Telehealth: Payer: Self-pay | Admitting: Surgery

## 2016-01-23 NOTE — Telephone Encounter (Signed)
-----   Message from Denman George, RN sent at 01/19/2016  1:53 PM EST ----- Regarding: needs 3 week f/u with [facility]; no studies needed   ----- Message -----    From: Gabriel Earing, PA-C    Sent: 01/19/2016  12:15 PM      To: [facility] Charge Pool  S/p 2nd stage BVT right am.  F/u with Dr. Trula Slade in 3 weeks.  Does not need any studies.  Thanks, Aldona Bar

## 2016-01-23 NOTE — Telephone Encounter (Signed)
Spoke with pt, dpm °

## 2016-01-24 DIAGNOSIS — N2581 Secondary hyperparathyroidism of renal origin: Secondary | ICD-10-CM | POA: Diagnosis not present

## 2016-01-24 DIAGNOSIS — D631 Anemia in chronic kidney disease: Secondary | ICD-10-CM | POA: Diagnosis not present

## 2016-01-24 DIAGNOSIS — N186 End stage renal disease: Secondary | ICD-10-CM | POA: Diagnosis not present

## 2016-01-24 DIAGNOSIS — F329 Major depressive disorder, single episode, unspecified: Secondary | ICD-10-CM | POA: Diagnosis not present

## 2016-01-24 DIAGNOSIS — F603 Borderline personality disorder: Secondary | ICD-10-CM | POA: Diagnosis not present

## 2016-01-24 DIAGNOSIS — F0390 Unspecified dementia without behavioral disturbance: Secondary | ICD-10-CM | POA: Diagnosis not present

## 2016-01-24 DIAGNOSIS — F419 Anxiety disorder, unspecified: Secondary | ICD-10-CM | POA: Diagnosis not present

## 2016-01-24 DIAGNOSIS — E119 Type 2 diabetes mellitus without complications: Secondary | ICD-10-CM | POA: Diagnosis not present

## 2016-01-25 DIAGNOSIS — M7981 Nontraumatic hematoma of soft tissue: Secondary | ICD-10-CM | POA: Diagnosis not present

## 2016-01-25 DIAGNOSIS — M79601 Pain in right arm: Secondary | ICD-10-CM | POA: Diagnosis not present

## 2016-01-26 DIAGNOSIS — D631 Anemia in chronic kidney disease: Secondary | ICD-10-CM | POA: Diagnosis not present

## 2016-01-26 DIAGNOSIS — N186 End stage renal disease: Secondary | ICD-10-CM | POA: Diagnosis not present

## 2016-01-26 DIAGNOSIS — N2581 Secondary hyperparathyroidism of renal origin: Secondary | ICD-10-CM | POA: Diagnosis not present

## 2016-01-26 DIAGNOSIS — E119 Type 2 diabetes mellitus without complications: Secondary | ICD-10-CM | POA: Diagnosis not present

## 2016-01-28 DIAGNOSIS — N2581 Secondary hyperparathyroidism of renal origin: Secondary | ICD-10-CM | POA: Diagnosis not present

## 2016-01-28 DIAGNOSIS — D631 Anemia in chronic kidney disease: Secondary | ICD-10-CM | POA: Diagnosis not present

## 2016-01-28 DIAGNOSIS — E119 Type 2 diabetes mellitus without complications: Secondary | ICD-10-CM | POA: Diagnosis not present

## 2016-01-28 DIAGNOSIS — N186 End stage renal disease: Secondary | ICD-10-CM | POA: Diagnosis not present

## 2016-01-31 DIAGNOSIS — N2581 Secondary hyperparathyroidism of renal origin: Secondary | ICD-10-CM | POA: Diagnosis not present

## 2016-01-31 DIAGNOSIS — N186 End stage renal disease: Secondary | ICD-10-CM | POA: Diagnosis not present

## 2016-01-31 DIAGNOSIS — D631 Anemia in chronic kidney disease: Secondary | ICD-10-CM | POA: Diagnosis not present

## 2016-01-31 DIAGNOSIS — E119 Type 2 diabetes mellitus without complications: Secondary | ICD-10-CM | POA: Diagnosis not present

## 2016-02-01 DIAGNOSIS — H5203 Hypermetropia, bilateral: Secondary | ICD-10-CM | POA: Diagnosis not present

## 2016-02-01 DIAGNOSIS — H31093 Other chorioretinal scars, bilateral: Secondary | ICD-10-CM | POA: Diagnosis not present

## 2016-02-01 DIAGNOSIS — E119 Type 2 diabetes mellitus without complications: Secondary | ICD-10-CM | POA: Diagnosis not present

## 2016-02-01 DIAGNOSIS — H524 Presbyopia: Secondary | ICD-10-CM | POA: Diagnosis not present

## 2016-02-01 DIAGNOSIS — Z794 Long term (current) use of insulin: Secondary | ICD-10-CM | POA: Diagnosis not present

## 2016-02-01 DIAGNOSIS — Z961 Presence of intraocular lens: Secondary | ICD-10-CM | POA: Diagnosis not present

## 2016-02-01 DIAGNOSIS — H5202 Hypermetropia, left eye: Secondary | ICD-10-CM | POA: Diagnosis not present

## 2016-02-02 DIAGNOSIS — D631 Anemia in chronic kidney disease: Secondary | ICD-10-CM | POA: Diagnosis not present

## 2016-02-02 DIAGNOSIS — N186 End stage renal disease: Secondary | ICD-10-CM | POA: Diagnosis not present

## 2016-02-02 DIAGNOSIS — N2581 Secondary hyperparathyroidism of renal origin: Secondary | ICD-10-CM | POA: Diagnosis not present

## 2016-02-02 DIAGNOSIS — E119 Type 2 diabetes mellitus without complications: Secondary | ICD-10-CM | POA: Diagnosis not present

## 2016-02-03 ENCOUNTER — Encounter: Payer: Self-pay | Admitting: Surgery

## 2016-02-04 DIAGNOSIS — N2581 Secondary hyperparathyroidism of renal origin: Secondary | ICD-10-CM | POA: Diagnosis not present

## 2016-02-04 DIAGNOSIS — D631 Anemia in chronic kidney disease: Secondary | ICD-10-CM | POA: Diagnosis not present

## 2016-02-04 DIAGNOSIS — E119 Type 2 diabetes mellitus without complications: Secondary | ICD-10-CM | POA: Diagnosis not present

## 2016-02-04 DIAGNOSIS — N186 End stage renal disease: Secondary | ICD-10-CM | POA: Diagnosis not present

## 2016-02-06 DIAGNOSIS — F339 Major depressive disorder, recurrent, unspecified: Secondary | ICD-10-CM | POA: Diagnosis not present

## 2016-02-06 DIAGNOSIS — F603 Borderline personality disorder: Secondary | ICD-10-CM | POA: Diagnosis not present

## 2016-02-06 DIAGNOSIS — E119 Type 2 diabetes mellitus without complications: Secondary | ICD-10-CM | POA: Diagnosis not present

## 2016-02-06 DIAGNOSIS — H903 Sensorineural hearing loss, bilateral: Secondary | ICD-10-CM | POA: Diagnosis not present

## 2016-02-06 DIAGNOSIS — F0390 Unspecified dementia without behavioral disturbance: Secondary | ICD-10-CM | POA: Diagnosis not present

## 2016-02-06 DIAGNOSIS — M6281 Muscle weakness (generalized): Secondary | ICD-10-CM | POA: Diagnosis not present

## 2016-02-06 DIAGNOSIS — W19XXXA Unspecified fall, initial encounter: Secondary | ICD-10-CM | POA: Diagnosis not present

## 2016-02-07 DIAGNOSIS — F0390 Unspecified dementia without behavioral disturbance: Secondary | ICD-10-CM | POA: Diagnosis not present

## 2016-02-07 DIAGNOSIS — D631 Anemia in chronic kidney disease: Secondary | ICD-10-CM | POA: Diagnosis not present

## 2016-02-07 DIAGNOSIS — N186 End stage renal disease: Secondary | ICD-10-CM | POA: Diagnosis not present

## 2016-02-07 DIAGNOSIS — F419 Anxiety disorder, unspecified: Secondary | ICD-10-CM | POA: Diagnosis not present

## 2016-02-07 DIAGNOSIS — F603 Borderline personality disorder: Secondary | ICD-10-CM | POA: Diagnosis not present

## 2016-02-07 DIAGNOSIS — E119 Type 2 diabetes mellitus without complications: Secondary | ICD-10-CM | POA: Diagnosis not present

## 2016-02-07 DIAGNOSIS — F329 Major depressive disorder, single episode, unspecified: Secondary | ICD-10-CM | POA: Diagnosis not present

## 2016-02-07 DIAGNOSIS — N2581 Secondary hyperparathyroidism of renal origin: Secondary | ICD-10-CM | POA: Diagnosis not present

## 2016-02-08 DIAGNOSIS — R5381 Other malaise: Secondary | ICD-10-CM | POA: Diagnosis not present

## 2016-02-08 DIAGNOSIS — G3184 Mild cognitive impairment, so stated: Secondary | ICD-10-CM | POA: Diagnosis not present

## 2016-02-08 DIAGNOSIS — M6281 Muscle weakness (generalized): Secondary | ICD-10-CM | POA: Diagnosis not present

## 2016-02-08 DIAGNOSIS — R2689 Other abnormalities of gait and mobility: Secondary | ICD-10-CM | POA: Diagnosis not present

## 2016-02-09 DIAGNOSIS — E119 Type 2 diabetes mellitus without complications: Secondary | ICD-10-CM | POA: Diagnosis not present

## 2016-02-09 DIAGNOSIS — D631 Anemia in chronic kidney disease: Secondary | ICD-10-CM | POA: Diagnosis not present

## 2016-02-09 DIAGNOSIS — N186 End stage renal disease: Secondary | ICD-10-CM | POA: Diagnosis not present

## 2016-02-09 DIAGNOSIS — N2581 Secondary hyperparathyroidism of renal origin: Secondary | ICD-10-CM | POA: Diagnosis not present

## 2016-02-10 DIAGNOSIS — N186 End stage renal disease: Secondary | ICD-10-CM | POA: Diagnosis not present

## 2016-02-10 DIAGNOSIS — E1122 Type 2 diabetes mellitus with diabetic chronic kidney disease: Secondary | ICD-10-CM | POA: Diagnosis not present

## 2016-02-10 DIAGNOSIS — Z992 Dependence on renal dialysis: Secondary | ICD-10-CM | POA: Diagnosis not present

## 2016-02-11 DIAGNOSIS — D631 Anemia in chronic kidney disease: Secondary | ICD-10-CM | POA: Diagnosis not present

## 2016-02-11 DIAGNOSIS — N186 End stage renal disease: Secondary | ICD-10-CM | POA: Diagnosis not present

## 2016-02-11 DIAGNOSIS — N2581 Secondary hyperparathyroidism of renal origin: Secondary | ICD-10-CM | POA: Diagnosis not present

## 2016-02-11 DIAGNOSIS — T8249XD Other complication of vascular dialysis catheter, subsequent encounter: Secondary | ICD-10-CM | POA: Diagnosis not present

## 2016-02-13 ENCOUNTER — Ambulatory Visit (INDEPENDENT_AMBULATORY_CARE_PROVIDER_SITE_OTHER): Payer: Medicare Other | Admitting: Surgery

## 2016-02-13 ENCOUNTER — Encounter: Payer: Self-pay | Admitting: Surgery

## 2016-02-13 VITALS — BP 172/79 | HR 70 | Ht 60.0 in | Wt 136.0 lb

## 2016-02-13 DIAGNOSIS — N186 End stage renal disease: Secondary | ICD-10-CM

## 2016-02-13 NOTE — Progress Notes (Signed)
Patient name: Deborah Hendricks MRN: [id number] DOB: [birth year] Sex: female  REASON FOR VISIT: follow up 2nd stage BVt  HPI: the patient is back for follow-up. On 11/25/2015 she underwent a right first stage basilic vein transposition. She reports no Symptoms consistent with steal syndrome.  On 01/19/2016 she underwent a second stage basilic vein procedure.  She reports no complaints.  The patient also underwent a left above-knee amputation on 12/15/2015. She denies any phantom pain. She is eager to get fitted for prosthesis  Current Outpatient Prescriptions  Medication Sig Dispense Refill  . apixaban (ELIQUIS) 5 MG TABS tablet Take 5 mg by mouth daily.    [name] aspirin EC 325 MG tablet Take 325 mg by mouth daily.    . carbamide peroxide (DEBROX) 6.5 % otic solution Place 5 drops into both ears 2 (two) times daily.    . carvedilol (COREG) 12.5 MG tablet Take 12.5 mg by mouth every Tuesday, Thursday, and Saturday at 6 PM.    . carvedilol (COREG) 12.5 MG tablet Take 12.5 mg by mouth See admin instructions. Take 12.5 mg every sun, mon, wed, and fri twice daily    . esomeprazole (NEXIUM) 20 MG capsule Take 20 mg by mouth daily at 12 noon.    . gabapentin (NEURONTIN) 300 MG capsule Take 300 mg by mouth 3 (three) times daily.    [name] glucagon 1 MG injection Inject 1 mg into the vein once as needed (hypoglycemia).    . insulin aspart (NOVOLOG) 100 UNIT/ML injection Inject 2-10 Units into the skin 3 (three) times daily before meals. 151-200=2 units, 201-250=4 units, 251-300=6 units, 301-350=8 units, 351-400=10 units, >400=10 units and Call MD    . insulin detemir (LEVEMIR) 100 UNIT/ML injection Inject 18 Units into the skin daily.    [name] lactulose (CHRONULAC) 10 GM/15ML solution Take 20 g by mouth 2 (two) times daily as needed for mild constipation.    . magnesium oxide (MAG-OX) 400 MG tablet Take 400 mg by mouth daily.    . Nutritional Supplements (FEEDING SUPPLEMENT, NEPRO CARB STEADY,) LIQD Take 237 mLs by  mouth 2 (two) times daily between meals.    . ondansetron (ZOFRAN) 4 MG tablet Take 4 mg by mouth every 8 (eight) hours as needed for nausea or vomiting.    [name] oxyCODONE (ROXICODONE) 5 MG immediate release tablet Take 1 tablet (5 mg total) by mouth every 6 (six) hours as needed. 20 tablet 0  . traZODone (DESYREL) 50 MG tablet Take 25 mg by mouth at bedtime.     No current facility-administered medications for this visit.    REVIEW OF SYSTEMS:  [X]  denotes positive finding, [ ]  denotes negative finding Cardiac  Comments:  Chest pain or chest pressure:    Shortness of breath upon exertion:    Short of breath when lying flat:    Irregular heart rhythm:    Constitutional    Fever or chills:      PHYSICAL EXAM: Filed Vitals:   02/13/16 0915 02/13/16 0918  BP: 176/78 172/79  Pulse: 70   Height: 5' (1.524 m)   Weight: 136 lb (61.689 kg)   SpO2: 99%     GENERAL: The patient is a well-nourished female, in no acute distress. The vital signs are documented above. CARDIOVASCULAR: There is a regular rate and rhythm. PULMONARY: There is good air exchange bilaterally without wheezing or rales. Excellent thrill within right basilic vein transposition. Left above-knee amputation is completely healed Ulcer on right calcaneus without opening  MEDICAL ISSUES: End-stage renal disease: The patient's fistula will be ready for use in approximately one month Lower extremity peripheral vascular disease: Her amputation is completely healed.  She is a process of getting fitted for prosthesis.  She does have a pressure ulcer without opening on the right heel.  She is wearing pressure offloading boots at this time.  She'll follow up on an as-needed basis.  [name] [employer]: [phone]

## 2016-02-14 DIAGNOSIS — T8249XD Other complication of vascular dialysis catheter, subsequent encounter: Secondary | ICD-10-CM | POA: Diagnosis not present

## 2016-02-14 DIAGNOSIS — D631 Anemia in chronic kidney disease: Secondary | ICD-10-CM | POA: Diagnosis not present

## 2016-02-14 DIAGNOSIS — N186 End stage renal disease: Secondary | ICD-10-CM | POA: Diagnosis not present

## 2016-02-14 DIAGNOSIS — N2581 Secondary hyperparathyroidism of renal origin: Secondary | ICD-10-CM | POA: Diagnosis not present

## 2016-02-16 DIAGNOSIS — G3184 Mild cognitive impairment, so stated: Secondary | ICD-10-CM | POA: Diagnosis not present

## 2016-02-16 DIAGNOSIS — R5381 Other malaise: Secondary | ICD-10-CM | POA: Diagnosis not present

## 2016-02-16 DIAGNOSIS — M6281 Muscle weakness (generalized): Secondary | ICD-10-CM | POA: Diagnosis not present

## 2016-02-16 DIAGNOSIS — R2689 Other abnormalities of gait and mobility: Secondary | ICD-10-CM | POA: Diagnosis not present

## 2016-02-16 DIAGNOSIS — T8249XD Other complication of vascular dialysis catheter, subsequent encounter: Secondary | ICD-10-CM | POA: Diagnosis not present

## 2016-02-16 DIAGNOSIS — D631 Anemia in chronic kidney disease: Secondary | ICD-10-CM | POA: Diagnosis not present

## 2016-02-16 DIAGNOSIS — N2581 Secondary hyperparathyroidism of renal origin: Secondary | ICD-10-CM | POA: Diagnosis not present

## 2016-02-16 DIAGNOSIS — N186 End stage renal disease: Secondary | ICD-10-CM | POA: Diagnosis not present

## 2016-02-18 DIAGNOSIS — D631 Anemia in chronic kidney disease: Secondary | ICD-10-CM | POA: Diagnosis not present

## 2016-02-18 DIAGNOSIS — T8249XD Other complication of vascular dialysis catheter, subsequent encounter: Secondary | ICD-10-CM | POA: Diagnosis not present

## 2016-02-18 DIAGNOSIS — N186 End stage renal disease: Secondary | ICD-10-CM | POA: Diagnosis not present

## 2016-02-18 DIAGNOSIS — N2581 Secondary hyperparathyroidism of renal origin: Secondary | ICD-10-CM | POA: Diagnosis not present

## 2016-02-21 DIAGNOSIS — T8249XD Other complication of vascular dialysis catheter, subsequent encounter: Secondary | ICD-10-CM | POA: Diagnosis not present

## 2016-02-21 DIAGNOSIS — N186 End stage renal disease: Secondary | ICD-10-CM | POA: Diagnosis not present

## 2016-02-21 DIAGNOSIS — D631 Anemia in chronic kidney disease: Secondary | ICD-10-CM | POA: Diagnosis not present

## 2016-02-21 DIAGNOSIS — N2581 Secondary hyperparathyroidism of renal origin: Secondary | ICD-10-CM | POA: Diagnosis not present

## 2016-02-23 DIAGNOSIS — T8249XD Other complication of vascular dialysis catheter, subsequent encounter: Secondary | ICD-10-CM | POA: Diagnosis not present

## 2016-02-23 DIAGNOSIS — N2581 Secondary hyperparathyroidism of renal origin: Secondary | ICD-10-CM | POA: Diagnosis not present

## 2016-02-23 DIAGNOSIS — N186 End stage renal disease: Secondary | ICD-10-CM | POA: Diagnosis not present

## 2016-02-23 DIAGNOSIS — D631 Anemia in chronic kidney disease: Secondary | ICD-10-CM | POA: Diagnosis not present

## 2016-02-24 DIAGNOSIS — Z880 Allergy status to penicillin: Secondary | ICD-10-CM | POA: Diagnosis not present

## 2016-02-24 DIAGNOSIS — Z87891 Personal history of nicotine dependence: Secondary | ICD-10-CM | POA: Diagnosis not present

## 2016-02-24 DIAGNOSIS — E119 Type 2 diabetes mellitus without complications: Secondary | ICD-10-CM | POA: Diagnosis not present

## 2016-02-24 DIAGNOSIS — I10 Essential (primary) hypertension: Secondary | ICD-10-CM | POA: Diagnosis not present

## 2016-02-24 DIAGNOSIS — Z7982 Long term (current) use of aspirin: Secondary | ICD-10-CM | POA: Diagnosis not present

## 2016-02-24 DIAGNOSIS — H9041 Sensorineural hearing loss, unilateral, right ear, with unrestricted hearing on the contralateral side: Secondary | ICD-10-CM | POA: Diagnosis not present

## 2016-02-24 DIAGNOSIS — I69328 Other speech and language deficits following cerebral infarction: Secondary | ICD-10-CM | POA: Diagnosis not present

## 2016-02-24 DIAGNOSIS — H905 Unspecified sensorineural hearing loss: Secondary | ICD-10-CM | POA: Diagnosis not present

## 2016-02-24 DIAGNOSIS — I69359 Hemiplegia and hemiparesis following cerebral infarction affecting unspecified side: Secondary | ICD-10-CM | POA: Diagnosis not present

## 2016-02-24 DIAGNOSIS — Z7901 Long term (current) use of anticoagulants: Secondary | ICD-10-CM | POA: Diagnosis not present

## 2016-02-24 DIAGNOSIS — Z79899 Other long term (current) drug therapy: Secondary | ICD-10-CM | POA: Diagnosis not present

## 2016-02-24 DIAGNOSIS — I63239 Cerebral infarction due to unspecified occlusion or stenosis of unspecified carotid arteries: Secondary | ICD-10-CM | POA: Diagnosis not present

## 2016-02-24 DIAGNOSIS — Z882 Allergy status to sulfonamides status: Secondary | ICD-10-CM | POA: Diagnosis not present

## 2016-02-24 DIAGNOSIS — Z794 Long term (current) use of insulin: Secondary | ICD-10-CM | POA: Diagnosis not present

## 2016-02-24 DIAGNOSIS — Z993 Dependence on wheelchair: Secondary | ICD-10-CM | POA: Diagnosis not present

## 2016-02-24 DIAGNOSIS — Z8673 Personal history of transient ischemic attack (TIA), and cerebral infarction without residual deficits: Secondary | ICD-10-CM | POA: Diagnosis not present

## 2016-02-25 DIAGNOSIS — D631 Anemia in chronic kidney disease: Secondary | ICD-10-CM | POA: Diagnosis not present

## 2016-02-25 DIAGNOSIS — N186 End stage renal disease: Secondary | ICD-10-CM | POA: Diagnosis not present

## 2016-02-25 DIAGNOSIS — T8249XD Other complication of vascular dialysis catheter, subsequent encounter: Secondary | ICD-10-CM | POA: Diagnosis not present

## 2016-02-25 DIAGNOSIS — N2581 Secondary hyperparathyroidism of renal origin: Secondary | ICD-10-CM | POA: Diagnosis not present

## 2016-02-28 DIAGNOSIS — D631 Anemia in chronic kidney disease: Secondary | ICD-10-CM | POA: Diagnosis not present

## 2016-02-28 DIAGNOSIS — N2581 Secondary hyperparathyroidism of renal origin: Secondary | ICD-10-CM | POA: Diagnosis not present

## 2016-02-28 DIAGNOSIS — T8249XD Other complication of vascular dialysis catheter, subsequent encounter: Secondary | ICD-10-CM | POA: Diagnosis not present

## 2016-02-28 DIAGNOSIS — N186 End stage renal disease: Secondary | ICD-10-CM | POA: Diagnosis not present

## 2016-03-01 DIAGNOSIS — N186 End stage renal disease: Secondary | ICD-10-CM | POA: Diagnosis not present

## 2016-03-01 DIAGNOSIS — D631 Anemia in chronic kidney disease: Secondary | ICD-10-CM | POA: Diagnosis not present

## 2016-03-01 DIAGNOSIS — T8249XD Other complication of vascular dialysis catheter, subsequent encounter: Secondary | ICD-10-CM | POA: Diagnosis not present

## 2016-03-01 DIAGNOSIS — N2581 Secondary hyperparathyroidism of renal origin: Secondary | ICD-10-CM | POA: Diagnosis not present

## 2016-03-03 DIAGNOSIS — D631 Anemia in chronic kidney disease: Secondary | ICD-10-CM | POA: Diagnosis not present

## 2016-03-03 DIAGNOSIS — N186 End stage renal disease: Secondary | ICD-10-CM | POA: Diagnosis not present

## 2016-03-03 DIAGNOSIS — N2581 Secondary hyperparathyroidism of renal origin: Secondary | ICD-10-CM | POA: Diagnosis not present

## 2016-03-03 DIAGNOSIS — T8249XD Other complication of vascular dialysis catheter, subsequent encounter: Secondary | ICD-10-CM | POA: Diagnosis not present

## 2016-03-06 DIAGNOSIS — D631 Anemia in chronic kidney disease: Secondary | ICD-10-CM | POA: Diagnosis not present

## 2016-03-06 DIAGNOSIS — N186 End stage renal disease: Secondary | ICD-10-CM | POA: Diagnosis not present

## 2016-03-06 DIAGNOSIS — N2581 Secondary hyperparathyroidism of renal origin: Secondary | ICD-10-CM | POA: Diagnosis not present

## 2016-03-06 DIAGNOSIS — T8249XD Other complication of vascular dialysis catheter, subsequent encounter: Secondary | ICD-10-CM | POA: Diagnosis not present

## 2016-03-08 DIAGNOSIS — N2581 Secondary hyperparathyroidism of renal origin: Secondary | ICD-10-CM | POA: Diagnosis not present

## 2016-03-08 DIAGNOSIS — D631 Anemia in chronic kidney disease: Secondary | ICD-10-CM | POA: Diagnosis not present

## 2016-03-08 DIAGNOSIS — T8249XD Other complication of vascular dialysis catheter, subsequent encounter: Secondary | ICD-10-CM | POA: Diagnosis not present

## 2016-03-08 DIAGNOSIS — E119 Type 2 diabetes mellitus without complications: Secondary | ICD-10-CM | POA: Diagnosis not present

## 2016-03-08 DIAGNOSIS — N186 End stage renal disease: Secondary | ICD-10-CM | POA: Diagnosis not present

## 2016-03-09 DIAGNOSIS — G4700 Insomnia, unspecified: Secondary | ICD-10-CM | POA: Diagnosis not present

## 2016-03-09 DIAGNOSIS — F339 Major depressive disorder, recurrent, unspecified: Secondary | ICD-10-CM | POA: Diagnosis not present

## 2016-03-09 DIAGNOSIS — E119 Type 2 diabetes mellitus without complications: Secondary | ICD-10-CM | POA: Diagnosis not present

## 2016-03-09 DIAGNOSIS — N186 End stage renal disease: Secondary | ICD-10-CM | POA: Diagnosis not present

## 2016-03-09 DIAGNOSIS — I10 Essential (primary) hypertension: Secondary | ICD-10-CM | POA: Diagnosis not present

## 2016-03-09 DIAGNOSIS — F603 Borderline personality disorder: Secondary | ICD-10-CM | POA: Diagnosis not present

## 2016-03-09 DIAGNOSIS — I509 Heart failure, unspecified: Secondary | ICD-10-CM | POA: Diagnosis not present

## 2016-03-10 DIAGNOSIS — N2581 Secondary hyperparathyroidism of renal origin: Secondary | ICD-10-CM | POA: Diagnosis not present

## 2016-03-10 DIAGNOSIS — D631 Anemia in chronic kidney disease: Secondary | ICD-10-CM | POA: Diagnosis not present

## 2016-03-10 DIAGNOSIS — N186 End stage renal disease: Secondary | ICD-10-CM | POA: Diagnosis not present

## 2016-03-10 DIAGNOSIS — T8249XD Other complication of vascular dialysis catheter, subsequent encounter: Secondary | ICD-10-CM | POA: Diagnosis not present

## 2016-03-11 DIAGNOSIS — E1122 Type 2 diabetes mellitus with diabetic chronic kidney disease: Secondary | ICD-10-CM | POA: Diagnosis not present

## 2016-03-11 DIAGNOSIS — N186 End stage renal disease: Secondary | ICD-10-CM | POA: Diagnosis not present

## 2016-03-11 DIAGNOSIS — Z992 Dependence on renal dialysis: Secondary | ICD-10-CM | POA: Diagnosis not present

## 2016-03-13 DIAGNOSIS — E119 Type 2 diabetes mellitus without complications: Secondary | ICD-10-CM | POA: Diagnosis not present

## 2016-03-13 DIAGNOSIS — N186 End stage renal disease: Secondary | ICD-10-CM | POA: Diagnosis not present

## 2016-03-13 DIAGNOSIS — N2581 Secondary hyperparathyroidism of renal origin: Secondary | ICD-10-CM | POA: Diagnosis not present

## 2016-03-13 DIAGNOSIS — D631 Anemia in chronic kidney disease: Secondary | ICD-10-CM | POA: Diagnosis not present

## 2016-03-13 DIAGNOSIS — D500 Iron deficiency anemia secondary to blood loss (chronic): Secondary | ICD-10-CM | POA: Diagnosis not present

## 2016-03-15 DIAGNOSIS — D500 Iron deficiency anemia secondary to blood loss (chronic): Secondary | ICD-10-CM | POA: Diagnosis not present

## 2016-03-15 DIAGNOSIS — D631 Anemia in chronic kidney disease: Secondary | ICD-10-CM | POA: Diagnosis not present

## 2016-03-15 DIAGNOSIS — N2581 Secondary hyperparathyroidism of renal origin: Secondary | ICD-10-CM | POA: Diagnosis not present

## 2016-03-15 DIAGNOSIS — N186 End stage renal disease: Secondary | ICD-10-CM | POA: Diagnosis not present

## 2016-03-15 DIAGNOSIS — E119 Type 2 diabetes mellitus without complications: Secondary | ICD-10-CM | POA: Diagnosis not present

## 2016-03-17 DIAGNOSIS — N186 End stage renal disease: Secondary | ICD-10-CM | POA: Diagnosis not present

## 2016-03-17 DIAGNOSIS — D631 Anemia in chronic kidney disease: Secondary | ICD-10-CM | POA: Diagnosis not present

## 2016-03-17 DIAGNOSIS — D500 Iron deficiency anemia secondary to blood loss (chronic): Secondary | ICD-10-CM | POA: Diagnosis not present

## 2016-03-17 DIAGNOSIS — E119 Type 2 diabetes mellitus without complications: Secondary | ICD-10-CM | POA: Diagnosis not present

## 2016-03-17 DIAGNOSIS — N2581 Secondary hyperparathyroidism of renal origin: Secondary | ICD-10-CM | POA: Diagnosis not present

## 2016-03-20 DIAGNOSIS — N186 End stage renal disease: Secondary | ICD-10-CM | POA: Diagnosis not present

## 2016-03-20 DIAGNOSIS — N2581 Secondary hyperparathyroidism of renal origin: Secondary | ICD-10-CM | POA: Diagnosis not present

## 2016-03-20 DIAGNOSIS — E119 Type 2 diabetes mellitus without complications: Secondary | ICD-10-CM | POA: Diagnosis not present

## 2016-03-20 DIAGNOSIS — D631 Anemia in chronic kidney disease: Secondary | ICD-10-CM | POA: Diagnosis not present

## 2016-03-20 DIAGNOSIS — D500 Iron deficiency anemia secondary to blood loss (chronic): Secondary | ICD-10-CM | POA: Diagnosis not present

## 2016-03-22 DIAGNOSIS — N2581 Secondary hyperparathyroidism of renal origin: Secondary | ICD-10-CM | POA: Diagnosis not present

## 2016-03-22 DIAGNOSIS — D631 Anemia in chronic kidney disease: Secondary | ICD-10-CM | POA: Diagnosis not present

## 2016-03-22 DIAGNOSIS — E119 Type 2 diabetes mellitus without complications: Secondary | ICD-10-CM | POA: Diagnosis not present

## 2016-03-22 DIAGNOSIS — N186 End stage renal disease: Secondary | ICD-10-CM | POA: Diagnosis not present

## 2016-03-22 DIAGNOSIS — D500 Iron deficiency anemia secondary to blood loss (chronic): Secondary | ICD-10-CM | POA: Diagnosis not present

## 2016-03-24 DIAGNOSIS — D500 Iron deficiency anemia secondary to blood loss (chronic): Secondary | ICD-10-CM | POA: Diagnosis not present

## 2016-03-24 DIAGNOSIS — D631 Anemia in chronic kidney disease: Secondary | ICD-10-CM | POA: Diagnosis not present

## 2016-03-24 DIAGNOSIS — N2581 Secondary hyperparathyroidism of renal origin: Secondary | ICD-10-CM | POA: Diagnosis not present

## 2016-03-24 DIAGNOSIS — E119 Type 2 diabetes mellitus without complications: Secondary | ICD-10-CM | POA: Diagnosis not present

## 2016-03-24 DIAGNOSIS — N186 End stage renal disease: Secondary | ICD-10-CM | POA: Diagnosis not present

## 2016-03-27 DIAGNOSIS — N186 End stage renal disease: Secondary | ICD-10-CM | POA: Diagnosis not present

## 2016-03-29 DIAGNOSIS — N2581 Secondary hyperparathyroidism of renal origin: Secondary | ICD-10-CM | POA: Diagnosis not present

## 2016-03-29 DIAGNOSIS — E119 Type 2 diabetes mellitus without complications: Secondary | ICD-10-CM | POA: Diagnosis not present

## 2016-03-29 DIAGNOSIS — D631 Anemia in chronic kidney disease: Secondary | ICD-10-CM | POA: Diagnosis not present

## 2016-03-29 DIAGNOSIS — D500 Iron deficiency anemia secondary to blood loss (chronic): Secondary | ICD-10-CM | POA: Diagnosis not present

## 2016-03-29 DIAGNOSIS — N186 End stage renal disease: Secondary | ICD-10-CM | POA: Diagnosis not present

## 2016-03-31 DIAGNOSIS — N2581 Secondary hyperparathyroidism of renal origin: Secondary | ICD-10-CM | POA: Diagnosis not present

## 2016-03-31 DIAGNOSIS — D631 Anemia in chronic kidney disease: Secondary | ICD-10-CM | POA: Diagnosis not present

## 2016-03-31 DIAGNOSIS — E119 Type 2 diabetes mellitus without complications: Secondary | ICD-10-CM | POA: Diagnosis not present

## 2016-03-31 DIAGNOSIS — D500 Iron deficiency anemia secondary to blood loss (chronic): Secondary | ICD-10-CM | POA: Diagnosis not present

## 2016-03-31 DIAGNOSIS — N186 End stage renal disease: Secondary | ICD-10-CM | POA: Diagnosis not present

## 2016-04-03 DIAGNOSIS — N186 End stage renal disease: Secondary | ICD-10-CM | POA: Diagnosis not present

## 2016-04-03 DIAGNOSIS — D631 Anemia in chronic kidney disease: Secondary | ICD-10-CM | POA: Diagnosis not present

## 2016-04-03 DIAGNOSIS — N2581 Secondary hyperparathyroidism of renal origin: Secondary | ICD-10-CM | POA: Diagnosis not present

## 2016-04-03 DIAGNOSIS — E119 Type 2 diabetes mellitus without complications: Secondary | ICD-10-CM | POA: Diagnosis not present

## 2016-04-03 DIAGNOSIS — D500 Iron deficiency anemia secondary to blood loss (chronic): Secondary | ICD-10-CM | POA: Diagnosis not present

## 2016-04-04 DIAGNOSIS — I10 Essential (primary) hypertension: Secondary | ICD-10-CM | POA: Diagnosis not present

## 2016-04-04 DIAGNOSIS — E782 Mixed hyperlipidemia: Secondary | ICD-10-CM | POA: Diagnosis not present

## 2016-04-04 DIAGNOSIS — I639 Cerebral infarction, unspecified: Secondary | ICD-10-CM | POA: Diagnosis not present

## 2016-04-05 DIAGNOSIS — N186 End stage renal disease: Secondary | ICD-10-CM | POA: Diagnosis not present

## 2016-04-05 DIAGNOSIS — E119 Type 2 diabetes mellitus without complications: Secondary | ICD-10-CM | POA: Diagnosis not present

## 2016-04-05 DIAGNOSIS — N2581 Secondary hyperparathyroidism of renal origin: Secondary | ICD-10-CM | POA: Diagnosis not present

## 2016-04-05 DIAGNOSIS — D631 Anemia in chronic kidney disease: Secondary | ICD-10-CM | POA: Diagnosis not present

## 2016-04-05 DIAGNOSIS — D500 Iron deficiency anemia secondary to blood loss (chronic): Secondary | ICD-10-CM | POA: Diagnosis not present

## 2016-04-07 DIAGNOSIS — D631 Anemia in chronic kidney disease: Secondary | ICD-10-CM | POA: Diagnosis not present

## 2016-04-07 DIAGNOSIS — N2581 Secondary hyperparathyroidism of renal origin: Secondary | ICD-10-CM | POA: Diagnosis not present

## 2016-04-07 DIAGNOSIS — E119 Type 2 diabetes mellitus without complications: Secondary | ICD-10-CM | POA: Diagnosis not present

## 2016-04-07 DIAGNOSIS — N186 End stage renal disease: Secondary | ICD-10-CM | POA: Diagnosis not present

## 2016-04-07 DIAGNOSIS — D500 Iron deficiency anemia secondary to blood loss (chronic): Secondary | ICD-10-CM | POA: Diagnosis not present

## 2016-04-10 DIAGNOSIS — N186 End stage renal disease: Secondary | ICD-10-CM | POA: Diagnosis not present

## 2016-04-10 DIAGNOSIS — D500 Iron deficiency anemia secondary to blood loss (chronic): Secondary | ICD-10-CM | POA: Diagnosis not present

## 2016-04-10 DIAGNOSIS — E119 Type 2 diabetes mellitus without complications: Secondary | ICD-10-CM | POA: Diagnosis not present

## 2016-04-10 DIAGNOSIS — D631 Anemia in chronic kidney disease: Secondary | ICD-10-CM | POA: Diagnosis not present

## 2016-04-10 DIAGNOSIS — N2581 Secondary hyperparathyroidism of renal origin: Secondary | ICD-10-CM | POA: Diagnosis not present

## 2016-04-11 ENCOUNTER — Encounter: Payer: Self-pay | Admitting: Surgery

## 2016-04-11 DIAGNOSIS — F339 Major depressive disorder, recurrent, unspecified: Secondary | ICD-10-CM | POA: Diagnosis not present

## 2016-04-11 DIAGNOSIS — Z8673 Personal history of transient ischemic attack (TIA), and cerebral infarction without residual deficits: Secondary | ICD-10-CM | POA: Diagnosis not present

## 2016-04-11 DIAGNOSIS — F411 Generalized anxiety disorder: Secondary | ICD-10-CM | POA: Insufficient documentation

## 2016-04-11 DIAGNOSIS — Z794 Long term (current) use of insulin: Secondary | ICD-10-CM | POA: Diagnosis not present

## 2016-04-11 DIAGNOSIS — N186 End stage renal disease: Secondary | ICD-10-CM | POA: Insufficient documentation

## 2016-04-11 DIAGNOSIS — I739 Peripheral vascular disease, unspecified: Secondary | ICD-10-CM | POA: Diagnosis not present

## 2016-04-11 DIAGNOSIS — Z89612 Acquired absence of left leg above knee: Secondary | ICD-10-CM | POA: Diagnosis not present

## 2016-04-11 DIAGNOSIS — E1122 Type 2 diabetes mellitus with diabetic chronic kidney disease: Secondary | ICD-10-CM | POA: Diagnosis not present

## 2016-04-11 DIAGNOSIS — E1165 Type 2 diabetes mellitus with hyperglycemia: Secondary | ICD-10-CM | POA: Diagnosis not present

## 2016-04-11 DIAGNOSIS — Z992 Dependence on renal dialysis: Secondary | ICD-10-CM | POA: Diagnosis not present

## 2016-04-12 DIAGNOSIS — D500 Iron deficiency anemia secondary to blood loss (chronic): Secondary | ICD-10-CM | POA: Diagnosis not present

## 2016-04-12 DIAGNOSIS — E119 Type 2 diabetes mellitus without complications: Secondary | ICD-10-CM | POA: Diagnosis not present

## 2016-04-12 DIAGNOSIS — N186 End stage renal disease: Secondary | ICD-10-CM | POA: Diagnosis not present

## 2016-04-12 DIAGNOSIS — D631 Anemia in chronic kidney disease: Secondary | ICD-10-CM | POA: Diagnosis not present

## 2016-04-12 DIAGNOSIS — N2581 Secondary hyperparathyroidism of renal origin: Secondary | ICD-10-CM | POA: Diagnosis not present

## 2016-04-13 DIAGNOSIS — I150 Renovascular hypertension: Secondary | ICD-10-CM | POA: Insufficient documentation

## 2016-04-14 DIAGNOSIS — D631 Anemia in chronic kidney disease: Secondary | ICD-10-CM | POA: Diagnosis not present

## 2016-04-14 DIAGNOSIS — N186 End stage renal disease: Secondary | ICD-10-CM | POA: Diagnosis not present

## 2016-04-14 DIAGNOSIS — E119 Type 2 diabetes mellitus without complications: Secondary | ICD-10-CM | POA: Diagnosis not present

## 2016-04-14 DIAGNOSIS — D500 Iron deficiency anemia secondary to blood loss (chronic): Secondary | ICD-10-CM | POA: Diagnosis not present

## 2016-04-14 DIAGNOSIS — N2581 Secondary hyperparathyroidism of renal origin: Secondary | ICD-10-CM | POA: Diagnosis not present

## 2016-04-17 DIAGNOSIS — N2581 Secondary hyperparathyroidism of renal origin: Secondary | ICD-10-CM | POA: Diagnosis not present

## 2016-04-17 DIAGNOSIS — D631 Anemia in chronic kidney disease: Secondary | ICD-10-CM | POA: Diagnosis not present

## 2016-04-17 DIAGNOSIS — D500 Iron deficiency anemia secondary to blood loss (chronic): Secondary | ICD-10-CM | POA: Diagnosis not present

## 2016-04-17 DIAGNOSIS — E119 Type 2 diabetes mellitus without complications: Secondary | ICD-10-CM | POA: Diagnosis not present

## 2016-04-17 DIAGNOSIS — N186 End stage renal disease: Secondary | ICD-10-CM | POA: Diagnosis not present

## 2016-04-19 DIAGNOSIS — N186 End stage renal disease: Secondary | ICD-10-CM | POA: Diagnosis not present

## 2016-04-19 DIAGNOSIS — Z9114 Patient's other noncompliance with medication regimen: Secondary | ICD-10-CM | POA: Insufficient documentation

## 2016-04-19 DIAGNOSIS — E119 Type 2 diabetes mellitus without complications: Secondary | ICD-10-CM | POA: Diagnosis not present

## 2016-04-19 DIAGNOSIS — Z8659 Personal history of other mental and behavioral disorders: Secondary | ICD-10-CM | POA: Insufficient documentation

## 2016-04-19 DIAGNOSIS — D500 Iron deficiency anemia secondary to blood loss (chronic): Secondary | ICD-10-CM | POA: Diagnosis not present

## 2016-04-19 DIAGNOSIS — I480 Paroxysmal atrial fibrillation: Secondary | ICD-10-CM | POA: Insufficient documentation

## 2016-04-19 DIAGNOSIS — N2581 Secondary hyperparathyroidism of renal origin: Secondary | ICD-10-CM | POA: Diagnosis not present

## 2016-04-19 DIAGNOSIS — D631 Anemia in chronic kidney disease: Secondary | ICD-10-CM | POA: Diagnosis not present

## 2016-04-19 DIAGNOSIS — G40909 Epilepsy, unspecified, not intractable, without status epilepticus: Secondary | ICD-10-CM | POA: Insufficient documentation

## 2016-04-21 DIAGNOSIS — N2581 Secondary hyperparathyroidism of renal origin: Secondary | ICD-10-CM | POA: Diagnosis not present

## 2016-04-21 DIAGNOSIS — D631 Anemia in chronic kidney disease: Secondary | ICD-10-CM | POA: Diagnosis not present

## 2016-04-21 DIAGNOSIS — E119 Type 2 diabetes mellitus without complications: Secondary | ICD-10-CM | POA: Diagnosis not present

## 2016-04-21 DIAGNOSIS — N186 End stage renal disease: Secondary | ICD-10-CM | POA: Diagnosis not present

## 2016-04-21 DIAGNOSIS — D500 Iron deficiency anemia secondary to blood loss (chronic): Secondary | ICD-10-CM | POA: Diagnosis not present

## 2016-04-24 DIAGNOSIS — E119 Type 2 diabetes mellitus without complications: Secondary | ICD-10-CM | POA: Diagnosis not present

## 2016-04-24 DIAGNOSIS — D631 Anemia in chronic kidney disease: Secondary | ICD-10-CM | POA: Diagnosis not present

## 2016-04-24 DIAGNOSIS — N2581 Secondary hyperparathyroidism of renal origin: Secondary | ICD-10-CM | POA: Diagnosis not present

## 2016-04-24 DIAGNOSIS — N186 End stage renal disease: Secondary | ICD-10-CM | POA: Diagnosis not present

## 2016-04-24 DIAGNOSIS — D500 Iron deficiency anemia secondary to blood loss (chronic): Secondary | ICD-10-CM | POA: Diagnosis not present

## 2016-04-26 DIAGNOSIS — D500 Iron deficiency anemia secondary to blood loss (chronic): Secondary | ICD-10-CM | POA: Diagnosis not present

## 2016-04-26 DIAGNOSIS — N186 End stage renal disease: Secondary | ICD-10-CM | POA: Diagnosis not present

## 2016-04-26 DIAGNOSIS — N2581 Secondary hyperparathyroidism of renal origin: Secondary | ICD-10-CM | POA: Diagnosis not present

## 2016-04-26 DIAGNOSIS — E119 Type 2 diabetes mellitus without complications: Secondary | ICD-10-CM | POA: Diagnosis not present

## 2016-04-26 DIAGNOSIS — D631 Anemia in chronic kidney disease: Secondary | ICD-10-CM | POA: Diagnosis not present

## 2016-04-28 DIAGNOSIS — N186 End stage renal disease: Secondary | ICD-10-CM | POA: Diagnosis not present

## 2016-04-28 DIAGNOSIS — D500 Iron deficiency anemia secondary to blood loss (chronic): Secondary | ICD-10-CM | POA: Diagnosis not present

## 2016-04-28 DIAGNOSIS — E119 Type 2 diabetes mellitus without complications: Secondary | ICD-10-CM | POA: Diagnosis not present

## 2016-04-28 DIAGNOSIS — N2581 Secondary hyperparathyroidism of renal origin: Secondary | ICD-10-CM | POA: Diagnosis not present

## 2016-04-28 DIAGNOSIS — D631 Anemia in chronic kidney disease: Secondary | ICD-10-CM | POA: Diagnosis not present

## 2016-04-30 DIAGNOSIS — N186 End stage renal disease: Secondary | ICD-10-CM | POA: Diagnosis not present

## 2016-04-30 DIAGNOSIS — T82868D Thrombosis of vascular prosthetic devices, implants and grafts, subsequent encounter: Secondary | ICD-10-CM | POA: Diagnosis not present

## 2016-04-30 DIAGNOSIS — Z992 Dependence on renal dialysis: Secondary | ICD-10-CM | POA: Diagnosis not present

## 2016-04-30 DIAGNOSIS — I871 Compression of vein: Secondary | ICD-10-CM | POA: Diagnosis not present

## 2016-05-01 DIAGNOSIS — E119 Type 2 diabetes mellitus without complications: Secondary | ICD-10-CM | POA: Diagnosis not present

## 2016-05-01 DIAGNOSIS — N186 End stage renal disease: Secondary | ICD-10-CM | POA: Diagnosis not present

## 2016-05-01 DIAGNOSIS — D500 Iron deficiency anemia secondary to blood loss (chronic): Secondary | ICD-10-CM | POA: Diagnosis not present

## 2016-05-01 DIAGNOSIS — D631 Anemia in chronic kidney disease: Secondary | ICD-10-CM | POA: Diagnosis not present

## 2016-05-01 DIAGNOSIS — N2581 Secondary hyperparathyroidism of renal origin: Secondary | ICD-10-CM | POA: Diagnosis not present

## 2016-05-03 DIAGNOSIS — D500 Iron deficiency anemia secondary to blood loss (chronic): Secondary | ICD-10-CM | POA: Diagnosis not present

## 2016-05-03 DIAGNOSIS — N2581 Secondary hyperparathyroidism of renal origin: Secondary | ICD-10-CM | POA: Diagnosis not present

## 2016-05-03 DIAGNOSIS — D631 Anemia in chronic kidney disease: Secondary | ICD-10-CM | POA: Diagnosis not present

## 2016-05-03 DIAGNOSIS — N186 End stage renal disease: Secondary | ICD-10-CM | POA: Diagnosis not present

## 2016-05-03 DIAGNOSIS — E119 Type 2 diabetes mellitus without complications: Secondary | ICD-10-CM | POA: Diagnosis not present

## 2016-05-05 DIAGNOSIS — D631 Anemia in chronic kidney disease: Secondary | ICD-10-CM | POA: Diagnosis not present

## 2016-05-05 DIAGNOSIS — N2581 Secondary hyperparathyroidism of renal origin: Secondary | ICD-10-CM | POA: Diagnosis not present

## 2016-05-05 DIAGNOSIS — N186 End stage renal disease: Secondary | ICD-10-CM | POA: Diagnosis not present

## 2016-05-05 DIAGNOSIS — D500 Iron deficiency anemia secondary to blood loss (chronic): Secondary | ICD-10-CM | POA: Diagnosis not present

## 2016-05-05 DIAGNOSIS — E119 Type 2 diabetes mellitus without complications: Secondary | ICD-10-CM | POA: Diagnosis not present

## 2016-05-08 DIAGNOSIS — D500 Iron deficiency anemia secondary to blood loss (chronic): Secondary | ICD-10-CM | POA: Diagnosis not present

## 2016-05-08 DIAGNOSIS — E119 Type 2 diabetes mellitus without complications: Secondary | ICD-10-CM | POA: Diagnosis not present

## 2016-05-08 DIAGNOSIS — D631 Anemia in chronic kidney disease: Secondary | ICD-10-CM | POA: Diagnosis not present

## 2016-05-08 DIAGNOSIS — N2581 Secondary hyperparathyroidism of renal origin: Secondary | ICD-10-CM | POA: Diagnosis not present

## 2016-05-08 DIAGNOSIS — N186 End stage renal disease: Secondary | ICD-10-CM | POA: Diagnosis not present

## 2016-05-10 DIAGNOSIS — D500 Iron deficiency anemia secondary to blood loss (chronic): Secondary | ICD-10-CM | POA: Diagnosis not present

## 2016-05-10 DIAGNOSIS — E119 Type 2 diabetes mellitus without complications: Secondary | ICD-10-CM | POA: Diagnosis not present

## 2016-05-10 DIAGNOSIS — N186 End stage renal disease: Secondary | ICD-10-CM | POA: Diagnosis not present

## 2016-05-10 DIAGNOSIS — N2581 Secondary hyperparathyroidism of renal origin: Secondary | ICD-10-CM | POA: Diagnosis not present

## 2016-05-10 DIAGNOSIS — D631 Anemia in chronic kidney disease: Secondary | ICD-10-CM | POA: Diagnosis not present

## 2016-05-11 DIAGNOSIS — E1122 Type 2 diabetes mellitus with diabetic chronic kidney disease: Secondary | ICD-10-CM | POA: Diagnosis not present

## 2016-05-11 DIAGNOSIS — N186 End stage renal disease: Secondary | ICD-10-CM | POA: Diagnosis not present

## 2016-05-11 DIAGNOSIS — Z992 Dependence on renal dialysis: Secondary | ICD-10-CM | POA: Diagnosis not present

## 2016-05-12 DIAGNOSIS — D500 Iron deficiency anemia secondary to blood loss (chronic): Secondary | ICD-10-CM | POA: Diagnosis not present

## 2016-05-12 DIAGNOSIS — E119 Type 2 diabetes mellitus without complications: Secondary | ICD-10-CM | POA: Diagnosis not present

## 2016-05-12 DIAGNOSIS — N186 End stage renal disease: Secondary | ICD-10-CM | POA: Diagnosis not present

## 2016-05-12 DIAGNOSIS — D631 Anemia in chronic kidney disease: Secondary | ICD-10-CM | POA: Diagnosis not present

## 2016-05-12 DIAGNOSIS — N2581 Secondary hyperparathyroidism of renal origin: Secondary | ICD-10-CM | POA: Diagnosis not present

## 2016-05-15 DIAGNOSIS — N2581 Secondary hyperparathyroidism of renal origin: Secondary | ICD-10-CM | POA: Diagnosis not present

## 2016-05-15 DIAGNOSIS — D631 Anemia in chronic kidney disease: Secondary | ICD-10-CM | POA: Diagnosis not present

## 2016-05-15 DIAGNOSIS — D500 Iron deficiency anemia secondary to blood loss (chronic): Secondary | ICD-10-CM | POA: Diagnosis not present

## 2016-05-15 DIAGNOSIS — E119 Type 2 diabetes mellitus without complications: Secondary | ICD-10-CM | POA: Diagnosis not present

## 2016-05-15 DIAGNOSIS — N186 End stage renal disease: Secondary | ICD-10-CM | POA: Diagnosis not present

## 2016-05-16 ENCOUNTER — Telehealth: Payer: Self-pay | Admitting: Cardiovascular Disease

## 2016-05-16 ENCOUNTER — Ambulatory Visit: Payer: Medicare Other | Admitting: Cardiology

## 2016-05-16 NOTE — Telephone Encounter (Signed)
New Message  Pt daughter call requesting to speak with RN. Pt daughter states pt missed appt today and wants to reschedule. Pt was schedule with Lurena Joiner, but now pts daughter is stating pt should be schedule with Dr. Gwenlyn Found. Pt daughter states pt needs a sooner appt than one offered to her. Please call back to discuss

## 2016-05-16 NOTE — Telephone Encounter (Signed)
Returned call to patient's daughter Deborah Hendricks.She stated mother needed surgical clearance to have fistula removed and will need a Wed or Fri appt.Stated she missed appt with Kerin Ransom PA this afternoon,went to wrong office.Appointment scheduled with Almyra Deforest PA Ludwig Clarks 05/18/16 at 11:30 am at [facility] office.

## 2016-05-17 ENCOUNTER — Telehealth: Payer: Self-pay | Admitting: Cardiovascular Disease

## 2016-05-17 DIAGNOSIS — E119 Type 2 diabetes mellitus without complications: Secondary | ICD-10-CM | POA: Diagnosis not present

## 2016-05-17 DIAGNOSIS — D500 Iron deficiency anemia secondary to blood loss (chronic): Secondary | ICD-10-CM | POA: Diagnosis not present

## 2016-05-17 DIAGNOSIS — N2581 Secondary hyperparathyroidism of renal origin: Secondary | ICD-10-CM | POA: Diagnosis not present

## 2016-05-17 DIAGNOSIS — N186 End stage renal disease: Secondary | ICD-10-CM | POA: Diagnosis not present

## 2016-05-17 DIAGNOSIS — D631 Anemia in chronic kidney disease: Secondary | ICD-10-CM | POA: Diagnosis not present

## 2016-05-17 NOTE — Telephone Encounter (Signed)
Left msg for daughter to call.

## 2016-05-17 NOTE — Telephone Encounter (Signed)
New message  Pt daughter verbalized that she needs another date for pt appt: reason -conflict in schedule for [facility] 05/18/16 I canceled appt  and   Offered the next available appt with [facility] as requested by pt daughter and she got upset and became very rude and she yelled it is an emergency  Pt daughter declined his next date of 06/08/16 and yelled for me to have Dr.Berry return her call

## 2016-05-18 ENCOUNTER — Ambulatory Visit: Payer: Medicare Other | Admitting: Physician Assistant

## 2016-05-19 DIAGNOSIS — D500 Iron deficiency anemia secondary to blood loss (chronic): Secondary | ICD-10-CM | POA: Diagnosis not present

## 2016-05-19 DIAGNOSIS — E119 Type 2 diabetes mellitus without complications: Secondary | ICD-10-CM | POA: Diagnosis not present

## 2016-05-19 DIAGNOSIS — N2581 Secondary hyperparathyroidism of renal origin: Secondary | ICD-10-CM | POA: Diagnosis not present

## 2016-05-19 DIAGNOSIS — N186 End stage renal disease: Secondary | ICD-10-CM | POA: Diagnosis not present

## 2016-05-19 DIAGNOSIS — D631 Anemia in chronic kidney disease: Secondary | ICD-10-CM | POA: Diagnosis not present

## 2016-05-22 DIAGNOSIS — N186 End stage renal disease: Secondary | ICD-10-CM | POA: Diagnosis not present

## 2016-05-22 DIAGNOSIS — E119 Type 2 diabetes mellitus without complications: Secondary | ICD-10-CM | POA: Diagnosis not present

## 2016-05-22 DIAGNOSIS — D500 Iron deficiency anemia secondary to blood loss (chronic): Secondary | ICD-10-CM | POA: Diagnosis not present

## 2016-05-22 DIAGNOSIS — N2581 Secondary hyperparathyroidism of renal origin: Secondary | ICD-10-CM | POA: Diagnosis not present

## 2016-05-22 DIAGNOSIS — D631 Anemia in chronic kidney disease: Secondary | ICD-10-CM | POA: Diagnosis not present

## 2016-05-24 ENCOUNTER — Encounter: Payer: Self-pay | Admitting: Cardiovascular Disease

## 2016-05-24 DIAGNOSIS — E119 Type 2 diabetes mellitus without complications: Secondary | ICD-10-CM | POA: Diagnosis not present

## 2016-05-24 DIAGNOSIS — D500 Iron deficiency anemia secondary to blood loss (chronic): Secondary | ICD-10-CM | POA: Diagnosis not present

## 2016-05-24 DIAGNOSIS — N186 End stage renal disease: Secondary | ICD-10-CM | POA: Diagnosis not present

## 2016-05-24 DIAGNOSIS — N2581 Secondary hyperparathyroidism of renal origin: Secondary | ICD-10-CM | POA: Diagnosis not present

## 2016-05-24 DIAGNOSIS — D631 Anemia in chronic kidney disease: Secondary | ICD-10-CM | POA: Diagnosis not present

## 2016-05-26 DIAGNOSIS — N186 End stage renal disease: Secondary | ICD-10-CM | POA: Diagnosis not present

## 2016-05-26 DIAGNOSIS — E119 Type 2 diabetes mellitus without complications: Secondary | ICD-10-CM | POA: Diagnosis not present

## 2016-05-26 DIAGNOSIS — D631 Anemia in chronic kidney disease: Secondary | ICD-10-CM | POA: Diagnosis not present

## 2016-05-26 DIAGNOSIS — D500 Iron deficiency anemia secondary to blood loss (chronic): Secondary | ICD-10-CM | POA: Diagnosis not present

## 2016-05-26 DIAGNOSIS — N2581 Secondary hyperparathyroidism of renal origin: Secondary | ICD-10-CM | POA: Diagnosis not present

## 2016-05-29 DIAGNOSIS — D500 Iron deficiency anemia secondary to blood loss (chronic): Secondary | ICD-10-CM | POA: Diagnosis not present

## 2016-05-29 DIAGNOSIS — D631 Anemia in chronic kidney disease: Secondary | ICD-10-CM | POA: Diagnosis not present

## 2016-05-29 DIAGNOSIS — E119 Type 2 diabetes mellitus without complications: Secondary | ICD-10-CM | POA: Diagnosis not present

## 2016-05-29 DIAGNOSIS — N2581 Secondary hyperparathyroidism of renal origin: Secondary | ICD-10-CM | POA: Diagnosis not present

## 2016-05-29 DIAGNOSIS — N186 End stage renal disease: Secondary | ICD-10-CM | POA: Diagnosis not present

## 2016-05-31 ENCOUNTER — Encounter: Payer: Self-pay | Admitting: Surgery

## 2016-05-31 DIAGNOSIS — N186 End stage renal disease: Secondary | ICD-10-CM | POA: Diagnosis not present

## 2016-05-31 DIAGNOSIS — D631 Anemia in chronic kidney disease: Secondary | ICD-10-CM | POA: Diagnosis not present

## 2016-05-31 DIAGNOSIS — E119 Type 2 diabetes mellitus without complications: Secondary | ICD-10-CM | POA: Diagnosis not present

## 2016-05-31 DIAGNOSIS — D500 Iron deficiency anemia secondary to blood loss (chronic): Secondary | ICD-10-CM | POA: Diagnosis not present

## 2016-05-31 DIAGNOSIS — N2581 Secondary hyperparathyroidism of renal origin: Secondary | ICD-10-CM | POA: Diagnosis not present

## 2016-06-02 DIAGNOSIS — D631 Anemia in chronic kidney disease: Secondary | ICD-10-CM | POA: Diagnosis not present

## 2016-06-02 DIAGNOSIS — N2581 Secondary hyperparathyroidism of renal origin: Secondary | ICD-10-CM | POA: Diagnosis not present

## 2016-06-02 DIAGNOSIS — E119 Type 2 diabetes mellitus without complications: Secondary | ICD-10-CM | POA: Diagnosis not present

## 2016-06-02 DIAGNOSIS — D500 Iron deficiency anemia secondary to blood loss (chronic): Secondary | ICD-10-CM | POA: Diagnosis not present

## 2016-06-02 DIAGNOSIS — N186 End stage renal disease: Secondary | ICD-10-CM | POA: Diagnosis not present

## 2016-06-04 ENCOUNTER — Ambulatory Visit (HOSPITAL_COMMUNITY): Payer: Medicare Other

## 2016-06-04 ENCOUNTER — Ambulatory Visit: Payer: Medicare Other | Admitting: Surgery

## 2016-06-05 DIAGNOSIS — N186 End stage renal disease: Secondary | ICD-10-CM | POA: Diagnosis not present

## 2016-06-05 DIAGNOSIS — E119 Type 2 diabetes mellitus without complications: Secondary | ICD-10-CM | POA: Diagnosis not present

## 2016-06-05 DIAGNOSIS — D631 Anemia in chronic kidney disease: Secondary | ICD-10-CM | POA: Diagnosis not present

## 2016-06-05 DIAGNOSIS — D500 Iron deficiency anemia secondary to blood loss (chronic): Secondary | ICD-10-CM | POA: Diagnosis not present

## 2016-06-05 DIAGNOSIS — N2581 Secondary hyperparathyroidism of renal origin: Secondary | ICD-10-CM | POA: Diagnosis not present

## 2016-06-06 ENCOUNTER — Telehealth: Payer: Self-pay | Admitting: Cardiovascular Disease

## 2016-06-06 NOTE — Telephone Encounter (Signed)
New message         The pt was placed on Dr. Gwenlyn Found Schedule on Friday July 28th at 11:45 am

## 2016-06-07 DIAGNOSIS — D631 Anemia in chronic kidney disease: Secondary | ICD-10-CM | POA: Diagnosis not present

## 2016-06-07 DIAGNOSIS — N186 End stage renal disease: Secondary | ICD-10-CM | POA: Diagnosis not present

## 2016-06-07 DIAGNOSIS — D500 Iron deficiency anemia secondary to blood loss (chronic): Secondary | ICD-10-CM | POA: Diagnosis not present

## 2016-06-07 DIAGNOSIS — N2581 Secondary hyperparathyroidism of renal origin: Secondary | ICD-10-CM | POA: Diagnosis not present

## 2016-06-07 DIAGNOSIS — E119 Type 2 diabetes mellitus without complications: Secondary | ICD-10-CM | POA: Diagnosis not present

## 2016-06-08 ENCOUNTER — Ambulatory Visit (INDEPENDENT_AMBULATORY_CARE_PROVIDER_SITE_OTHER): Payer: Medicare Other | Admitting: Cardiovascular Disease

## 2016-06-08 ENCOUNTER — Encounter: Payer: Self-pay | Admitting: Cardiovascular Disease

## 2016-06-08 VITALS — BP 168/86 | HR 73 | Ht 60.0 in

## 2016-06-08 DIAGNOSIS — Z01818 Encounter for other preprocedural examination: Secondary | ICD-10-CM | POA: Diagnosis not present

## 2016-06-08 DIAGNOSIS — Z86711 Personal history of pulmonary embolism: Secondary | ICD-10-CM | POA: Diagnosis not present

## 2016-06-08 DIAGNOSIS — Z0000 Encounter for general adult medical examination without abnormal findings: Secondary | ICD-10-CM

## 2016-06-08 DIAGNOSIS — I10 Essential (primary) hypertension: Secondary | ICD-10-CM | POA: Diagnosis not present

## 2016-06-08 DIAGNOSIS — I6930 Unspecified sequelae of cerebral infarction: Secondary | ICD-10-CM | POA: Insufficient documentation

## 2016-06-08 NOTE — Assessment & Plan Note (Signed)
History of hypertension blood pressure measured at 172/79. She is on carvedilol. I am not sure what her blood pressure is on her dialysis days however I will leave this to her primary care physician and nephrologist treat

## 2016-06-08 NOTE — Progress Notes (Signed)
06/08/2016 [name]   1946/05/05  [id number]  Primary Physician Rexene Agent, MD Primary Cardiologist: Lorretta Harp MD Deborah Hendricks  HPI:  Deborah Hendricks is a 70 year old divorced African-American female mother of 2, grandmother 2 grandchildren who currently lives with her daughter Deborah Hendricks who accompanies her today. She was referred by Dr. Jacqualyn Posey, her podiatrist for evaluation of potential treatment of critical limb ischemia. I last saw her in the office 11/16/15. Her cardiovascular risk factor profile is notable for 25 pack years of tobacco abuse having quit 4 years ago, treated hypertension, diabetes and hyperlipidemia. She has had 2 strokes the past. She's never had a heart attack and specifically denies chest pain, shortness of breath or claudication. She has chronic renal insufficiency on dialysis last 2 years with multiple failed fistulas that with a permacath in her right thigh. She has nonhealing wounds on her left first and fifth toes which are not healing. Dopplers suggest a left ABI 0.46 with occluded left popliteal and TIBIAL VESSELS. I performed angiography on her 11/28/15 revealing an occluded left popliteal artery with 0 vessel runoff. There were no real vascular blurry vision options that she'll be underwent left above-the-knee amputation by Dr. Trula Slade 12/15/15. She has not begun to use her prosthesis And has not undergone physical therapy rehabilitation therapy.   Current Outpatient Prescriptions  Medication Sig Dispense Refill  . apixaban (ELIQUIS) 5 MG TABS tablet Take 5 mg by mouth daily.    [name] aspirin EC 325 MG tablet Take 325 mg by mouth daily.    . carvedilol (COREG) 12.5 MG tablet Take 12.5 mg by mouth every Tuesday, Thursday, and Saturday at 6 PM.    . esomeprazole (NEXIUM) 20 MG capsule Take 20 mg by mouth daily at 12 noon.    . gabapentin (NEURONTIN) 300 MG capsule Take 300 mg by mouth 3 (three) times daily.    . insulin aspart (NOVOLOG) 100 UNIT/ML  injection Inject 2-10 Units into the skin 3 (three) times daily before meals. 151-200=2 units, 201-250=4 units, 251-300=6 units, 301-350=8 units, 351-400=10 units, >400=10 units and Call MD    . insulin detemir (LEVEMIR) 100 UNIT/ML injection Inject 18 Units into the skin daily.    . Nutritional Supplements (FEEDING SUPPLEMENT, NEPRO CARB STEADY,) LIQD Take 237 mLs by mouth 2 (two) times daily between meals.    . ondansetron (ZOFRAN) 4 MG tablet Take 4 mg by mouth every 8 (eight) hours as needed for nausea or vomiting.     No current facility-administered medications for this visit.     Allergies  Allergen Reactions  . Penicillins Hives, Itching and Rash    Has patient had a PCN reaction causing immediate rash, facial/tongue/throat swelling, SOB or lightheadedness with hypotension: Yes Has patient had a PCN reaction causing severe rash involving mucus membranes or skin necrosis:  Has patient had a PCN reaction that required hospitalization  Has patient had a PCN reaction occurring within the last 10 years:  If all of the above answers are "NO", then may proceed with Cephalosporin use.  [name] Amoxicillin Other (See Comments)    Unspecified Per MAR  . Latex Other (See Comments)    Unspecified Per MAR  . Sulfa Antibiotics Other (See Comments)    Unspecified Per MAR    Social History   Social History  . Marital status: Single    Spouse name: N/A  . Number of children: N/A  . Years of education: N/A   Occupational History  .  Not on file.   Social History Main Topics  . Smoking status: Former Smoker    Quit date: 11/17/2013  . Smokeless tobacco: Never Used  . Alcohol use No  . Drug use: No  . Sexual activity: Not on file   Other Topics Concern  . Not on file   Social History Narrative  . No narrative on file     Review of Systems: General: negative for chills, fever, night sweats or weight changes.  Cardiovascular: negative for chest pain, dyspnea on exertion, edema,  orthopnea, palpitations, paroxysmal nocturnal dyspnea or shortness of breath Dermatological: negative for rash Respiratory: negative for cough or wheezing Urologic: negative for hematuria Abdominal: negative for nausea, vomiting, diarrhea, bright red blood per rectum, melena, or hematemesis Neurologic: negative for visual changes, syncope, or dizziness All other systems reviewed and are otherwise negative except as noted above.    Blood pressure (!) 168/86, pulse 73, height 5' (1.524 m).  General appearance: alert and no distress Neck: no adenopathy, no JVD, supple, symmetrical, trachea midline, thyroid not enlarged, symmetric, no tenderness/mass/nodules and Soft left carotid bruit Lungs: clear to auscultation bilaterally Heart: regular rate and rhythm, S1, S2 normal, no murmur, click, rub or gallop Extremities: extremities normal, atraumatic, no cyanosis or edema and Status post left above-the-knee amputation  EKG normal sinus rhythm at 73 with septal Q waves. I personally reviewed this EKG  ASSESSMENT AND PLAN:   Critical lower limb ischemia History of critical limb ischemia status post angiography by myself 11/28/15 revealing an occluded popliteal artery with 0 vessel runoff. She did have nonhealing wound. I did not think she was a candidate for endovascular therapy. She ultimately underwent a left above-knee amputation by Dr. Trula Slade 12/15/15 but has not yet begun to wear her prosthesis. She will need physical therapy and rehabilitation  Essential hypertension History of hypertension blood pressure measured at 172/79. She is on carvedilol. I am not sure what her blood pressure is on her dialysis days however I will leave this to her primary care physician and nephrologist treat  History of pulmonary embolism History of remote pulmonary embolus and on Eliquis       Lorretta Harp MD [facility], [facility] 06/08/2016 12:32 PM

## 2016-06-08 NOTE — Patient Instructions (Signed)
Medication Instructions:  Your physician recommends that you continue on your current medications as directed. Please refer to the Current Medication list given to you today.   Testing/Procedures: Your physician has requested that you have a lexiscan myoview. For further information please visit [email or web address]. Please follow instruction sheet, as given.    Follow-Up: Your physician wants you to follow-up in: [place]. You will receive a reminder letter in the mail two months in advance. If you don't receive a letter, please call our office to schedule the follow-up appointment.   Pharmacologic Stress Echocardiogram A pharmacologic stress echocardiogram is a heart (cardiac) test used to check the function of your heart. This test may also be called a pharmacologic stress echocardiography. Pharmacologic means that a medicine is used to increase your heart rate and blood pressure.  This stress test will check how well your heart muscle and valves are working and determine if your heart muscle is getting enough blood. Some people exercise on a treadmill, which naturally increases or stresses the functioning of their heart. For those people unable to exercise on a treadmill, a medicine is used. This medicine stimulates your heart and will cause your heart to beat harder and more quickly, as if you were exercising.  An echocardiogram uses sound waves (ultrasound) to produce an image of your heart. If your heart does not work normally, it may indicate coronary artery disease with poor coronary blood supply. The coronary arteries are the arteries that bring blood and oxygen to your heart. LET [facility] CARE PROVIDER KNOW ABOUT:  Any allergies you have.  All medicines you are taking, including vitamins, herbs, eye drops, creams, and over-the-counter medicines.  Previous problems you or members of your family have had with the use of anesthetics.  Any blood disorders you  have.  Previous surgeries you have had.  Medical conditions you have.  Possibility of pregnancy, if this applies. RISKS AND COMPLICATIONS Generally, this is a safe procedure. However, as with any procedure, complications can occur. Possible complications can include:  You develop pain or pressure in the following areas:  Chest.  Jaw or neck.  Between your shoulder blades.  Radiating down your left arm.  Headache.  Dizziness or light-headedness.  Shortness of breath.  Increased or irregular heartbeat.  Low blood pressure.  Nausea or vomiting.  Flushing.  Redness going up the arm and slight pain during injection of medicine.  Heart attack (rare). BEFORE THE PROCEDURE  Avoid all forms of caffeine for 24 hours before your test or as directed by your health care provider. This includes coffee, tea (even decaffeinated tea), caffeinated sodas, chocolate, cocoa, and certain pain medicines.  Follow your health care provider's instructions regarding eating and drinking before the test.  Take your medicines as directed at regular times with water unless instructed otherwise. Exceptions may include:  If you have diabetes, ask how you are to take your insulin or pills. It is common to adjust insulin dosing the morning of the test.  If you are taking beta-blocker medicines, it is important to talk to your health care provider about these medicines well before the date of your test. Taking beta-blocker medicines may interfere with the test. In some cases, these medicines need to be changed or stopped 24 hours or more before the test.  If you wear a nitroglycerin patch, it may need to be removed prior to the test. Ask your health care provider if the patch should be removed before the  test.  If you use an inhaler for any breathing condition, bring it with you to the test.  If you are an outpatient, bring a snack so you can eat right after the stress phase of the test.  Do not  smoke for 4 hours prior to the test or as directed by your health care provider.  Wear comfortable shoes and clothing. Let your health care provider know if you were unable to complete or follow the preparations for your test. PROCEDURE   Multiple electrodes will be put on your chest. If needed, small areas of your chest may be shaved to get better contact with the electrodes. Once the electrodes are attached to your body, multiple wires will be attached to the electrodes, and your heart rate will be monitored.  An IV access will be started, and medicine will be given.  You will have an echocardiogram done at rest and done again at peak heart rate.  To produce an image of the heart, gel is applied to your chest, and a wand-like tool (transducer) is moved over the chest. The transducer sends the sound waves through the chest to create the moving images of your heart. AFTER THE PROCEDURE  Your heart rate and blood pressure will be monitored after the test.  You may return to your normal schedule, including diet, activities, and medicines, unless your health care provider tells you otherwise.   This information is not intended to replace advice given to you by your health care provider. Make sure you discuss any questions you have with your health care provider.   Document Released: 01/19/2004 Document Revised: 11/03/2013 Document Reviewed: 07/06/2013 Elsevier Interactive Patient Education [employer].   If you need a refill on your cardiac medications before your next appointment, please call your pharmacy.

## 2016-06-08 NOTE — Assessment & Plan Note (Signed)
History of critical limb ischemia status post angiography by myself 11/28/15 revealing an occluded popliteal artery with 0 vessel runoff. She did have nonhealing wound. I did not think she was a candidate for endovascular therapy. She ultimately underwent a left above-knee amputation by Dr. Trula Slade 12/15/15 but has not yet begun to wear her prosthesis. She will need physical therapy and rehabilitation

## 2016-06-08 NOTE — Assessment & Plan Note (Signed)
History of remote pulmonary embolus and on Eliquis. 

## 2016-06-09 DIAGNOSIS — N186 End stage renal disease: Secondary | ICD-10-CM | POA: Diagnosis not present

## 2016-06-09 DIAGNOSIS — E119 Type 2 diabetes mellitus without complications: Secondary | ICD-10-CM | POA: Diagnosis not present

## 2016-06-09 DIAGNOSIS — N2581 Secondary hyperparathyroidism of renal origin: Secondary | ICD-10-CM | POA: Diagnosis not present

## 2016-06-09 DIAGNOSIS — D500 Iron deficiency anemia secondary to blood loss (chronic): Secondary | ICD-10-CM | POA: Diagnosis not present

## 2016-06-09 DIAGNOSIS — D631 Anemia in chronic kidney disease: Secondary | ICD-10-CM | POA: Diagnosis not present

## 2016-06-11 DIAGNOSIS — I480 Paroxysmal atrial fibrillation: Secondary | ICD-10-CM | POA: Diagnosis not present

## 2016-06-11 DIAGNOSIS — R0789 Other chest pain: Secondary | ICD-10-CM | POA: Diagnosis not present

## 2016-06-11 DIAGNOSIS — I150 Renovascular hypertension: Secondary | ICD-10-CM | POA: Diagnosis not present

## 2016-06-11 DIAGNOSIS — Z992 Dependence on renal dialysis: Secondary | ICD-10-CM | POA: Diagnosis not present

## 2016-06-11 DIAGNOSIS — I739 Peripheral vascular disease, unspecified: Secondary | ICD-10-CM | POA: Diagnosis not present

## 2016-06-11 DIAGNOSIS — E1122 Type 2 diabetes mellitus with diabetic chronic kidney disease: Secondary | ICD-10-CM | POA: Diagnosis not present

## 2016-06-11 DIAGNOSIS — Z794 Long term (current) use of insulin: Secondary | ICD-10-CM | POA: Diagnosis not present

## 2016-06-11 DIAGNOSIS — E1165 Type 2 diabetes mellitus with hyperglycemia: Secondary | ICD-10-CM | POA: Diagnosis not present

## 2016-06-11 DIAGNOSIS — Z89612 Acquired absence of left leg above knee: Secondary | ICD-10-CM | POA: Diagnosis not present

## 2016-06-11 DIAGNOSIS — G629 Polyneuropathy, unspecified: Secondary | ICD-10-CM | POA: Insufficient documentation

## 2016-06-11 DIAGNOSIS — G6289 Other specified polyneuropathies: Secondary | ICD-10-CM | POA: Diagnosis not present

## 2016-06-11 DIAGNOSIS — N186 End stage renal disease: Secondary | ICD-10-CM | POA: Diagnosis not present

## 2016-06-12 ENCOUNTER — Telehealth: Payer: Self-pay | Admitting: *Deleted

## 2016-06-12 ENCOUNTER — Encounter (HOSPITAL_COMMUNITY): Payer: Self-pay

## 2016-06-12 ENCOUNTER — Other Ambulatory Visit: Payer: Self-pay | Admitting: *Deleted

## 2016-06-12 DIAGNOSIS — L03115 Cellulitis of right lower limb: Secondary | ICD-10-CM | POA: Diagnosis not present

## 2016-06-12 DIAGNOSIS — I10 Essential (primary) hypertension: Secondary | ICD-10-CM

## 2016-06-12 DIAGNOSIS — H905 Unspecified sensorineural hearing loss: Secondary | ICD-10-CM | POA: Insufficient documentation

## 2016-06-12 DIAGNOSIS — N186 End stage renal disease: Secondary | ICD-10-CM | POA: Diagnosis not present

## 2016-06-12 DIAGNOSIS — S81801A Unspecified open wound, right lower leg, initial encounter: Secondary | ICD-10-CM | POA: Diagnosis not present

## 2016-06-12 DIAGNOSIS — Z01810 Encounter for preprocedural cardiovascular examination: Secondary | ICD-10-CM

## 2016-06-12 DIAGNOSIS — I739 Peripheral vascular disease, unspecified: Secondary | ICD-10-CM

## 2016-06-12 DIAGNOSIS — D500 Iron deficiency anemia secondary to blood loss (chronic): Secondary | ICD-10-CM | POA: Diagnosis not present

## 2016-06-12 DIAGNOSIS — I480 Paroxysmal atrial fibrillation: Secondary | ICD-10-CM

## 2016-06-12 DIAGNOSIS — N2581 Secondary hyperparathyroidism of renal origin: Secondary | ICD-10-CM | POA: Diagnosis not present

## 2016-06-12 DIAGNOSIS — Z8673 Personal history of transient ischemic attack (TIA), and cerebral infarction without residual deficits: Secondary | ICD-10-CM

## 2016-06-12 DIAGNOSIS — D631 Anemia in chronic kidney disease: Secondary | ICD-10-CM | POA: Diagnosis not present

## 2016-06-12 DIAGNOSIS — E119 Type 2 diabetes mellitus without complications: Secondary | ICD-10-CM | POA: Diagnosis not present

## 2016-06-12 DIAGNOSIS — Z789 Other specified health status: Secondary | ICD-10-CM

## 2016-06-12 DIAGNOSIS — E1149 Type 2 diabetes mellitus with other diabetic neurological complication: Secondary | ICD-10-CM

## 2016-06-12 DIAGNOSIS — H903 Sensorineural hearing loss, bilateral: Secondary | ICD-10-CM | POA: Insufficient documentation

## 2016-06-12 DIAGNOSIS — Z01818 Encounter for other preprocedural examination: Secondary | ICD-10-CM

## 2016-06-12 NOTE — Telephone Encounter (Signed)
Called and spoke with Deborah Hendricks regarding Deborah Hendricks appointment scheduled for Friday 06/15/16.  I informed Deborah Hendricks this study needed to be scheduled at [facility] and I asked what days were convenient.  Deborah Hendricks was inquiring why the test had to be rescheduled and I told her I would have the nurse Hendricks to explain.  Deborah Hendricks told me to go ahead and schedule the study at the hospital, but she wanted to know why.

## 2016-06-13 ENCOUNTER — Other Ambulatory Visit: Payer: Self-pay | Admitting: *Deleted

## 2016-06-13 ENCOUNTER — Other Ambulatory Visit (HOSPITAL_COMMUNITY): Payer: Self-pay | Admitting: Family Medicine

## 2016-06-13 ENCOUNTER — Telehealth (HOSPITAL_COMMUNITY): Payer: Self-pay

## 2016-06-13 ENCOUNTER — Telehealth: Payer: Self-pay | Admitting: *Deleted

## 2016-06-13 DIAGNOSIS — Z8673 Personal history of transient ischemic attack (TIA), and cerebral infarction without residual deficits: Secondary | ICD-10-CM

## 2016-06-13 DIAGNOSIS — I480 Paroxysmal atrial fibrillation: Secondary | ICD-10-CM

## 2016-06-13 DIAGNOSIS — E1149 Type 2 diabetes mellitus with other diabetic neurological complication: Secondary | ICD-10-CM

## 2016-06-13 DIAGNOSIS — Z789 Other specified health status: Secondary | ICD-10-CM

## 2016-06-13 DIAGNOSIS — I739 Peripheral vascular disease, unspecified: Secondary | ICD-10-CM

## 2016-06-13 DIAGNOSIS — I150 Renovascular hypertension: Secondary | ICD-10-CM

## 2016-06-13 DIAGNOSIS — I10 Essential (primary) hypertension: Secondary | ICD-10-CM

## 2016-06-13 NOTE — Telephone Encounter (Signed)
Pt POA Deana was inquiring yesterday about why pt needed to be scheduled for her test at the hospital. I received that notification via note on EPIC from [facility]. I did call back and explain that due to pts limited IV access due to multiple failed fistulas and a perma cath in right thigh that pt may need access that I am not able to complete in an outpatient setting. Deanna was very understanding and thankful that we took this in to consideration and had the pt rescheduled for the hospital so as to not put unwarranted stress on pt.

## 2016-06-13 NOTE — Telephone Encounter (Signed)
And spoke with Deanna regarding appointment at [facility] for [place] Monday 06/25/16 @ 9:00 am.  Eartha Inch at 8:30 am main registration desk for check in--NPO after midnight and No Caffeine for 24 hours prior.  Deanna voiced her understanding.

## 2016-06-14 DIAGNOSIS — N186 End stage renal disease: Secondary | ICD-10-CM | POA: Diagnosis not present

## 2016-06-14 DIAGNOSIS — E119 Type 2 diabetes mellitus without complications: Secondary | ICD-10-CM | POA: Diagnosis not present

## 2016-06-14 DIAGNOSIS — L03115 Cellulitis of right lower limb: Secondary | ICD-10-CM | POA: Diagnosis not present

## 2016-06-14 DIAGNOSIS — D500 Iron deficiency anemia secondary to blood loss (chronic): Secondary | ICD-10-CM | POA: Diagnosis not present

## 2016-06-14 DIAGNOSIS — D631 Anemia in chronic kidney disease: Secondary | ICD-10-CM | POA: Diagnosis not present

## 2016-06-14 DIAGNOSIS — N2581 Secondary hyperparathyroidism of renal origin: Secondary | ICD-10-CM | POA: Diagnosis not present

## 2016-06-15 ENCOUNTER — Inpatient Hospital Stay (HOSPITAL_COMMUNITY): Admission: RE | Admit: 2016-06-15 | Payer: Medicare Other | Source: Ambulatory Visit

## 2016-06-16 DIAGNOSIS — D500 Iron deficiency anemia secondary to blood loss (chronic): Secondary | ICD-10-CM | POA: Diagnosis not present

## 2016-06-16 DIAGNOSIS — N2581 Secondary hyperparathyroidism of renal origin: Secondary | ICD-10-CM | POA: Diagnosis not present

## 2016-06-16 DIAGNOSIS — L03115 Cellulitis of right lower limb: Secondary | ICD-10-CM | POA: Diagnosis not present

## 2016-06-16 DIAGNOSIS — D631 Anemia in chronic kidney disease: Secondary | ICD-10-CM | POA: Diagnosis not present

## 2016-06-16 DIAGNOSIS — E119 Type 2 diabetes mellitus without complications: Secondary | ICD-10-CM | POA: Diagnosis not present

## 2016-06-16 DIAGNOSIS — N186 End stage renal disease: Secondary | ICD-10-CM | POA: Diagnosis not present

## 2016-06-19 DIAGNOSIS — E119 Type 2 diabetes mellitus without complications: Secondary | ICD-10-CM | POA: Diagnosis not present

## 2016-06-19 DIAGNOSIS — D500 Iron deficiency anemia secondary to blood loss (chronic): Secondary | ICD-10-CM | POA: Diagnosis not present

## 2016-06-19 DIAGNOSIS — D631 Anemia in chronic kidney disease: Secondary | ICD-10-CM | POA: Diagnosis not present

## 2016-06-19 DIAGNOSIS — N2581 Secondary hyperparathyroidism of renal origin: Secondary | ICD-10-CM | POA: Diagnosis not present

## 2016-06-19 DIAGNOSIS — L03115 Cellulitis of right lower limb: Secondary | ICD-10-CM | POA: Diagnosis not present

## 2016-06-19 DIAGNOSIS — N186 End stage renal disease: Secondary | ICD-10-CM | POA: Diagnosis not present

## 2016-06-21 DIAGNOSIS — L03115 Cellulitis of right lower limb: Secondary | ICD-10-CM | POA: Diagnosis not present

## 2016-06-21 DIAGNOSIS — E119 Type 2 diabetes mellitus without complications: Secondary | ICD-10-CM | POA: Diagnosis not present

## 2016-06-21 DIAGNOSIS — D500 Iron deficiency anemia secondary to blood loss (chronic): Secondary | ICD-10-CM | POA: Diagnosis not present

## 2016-06-21 DIAGNOSIS — D631 Anemia in chronic kidney disease: Secondary | ICD-10-CM | POA: Diagnosis not present

## 2016-06-21 DIAGNOSIS — N2581 Secondary hyperparathyroidism of renal origin: Secondary | ICD-10-CM | POA: Diagnosis not present

## 2016-06-21 DIAGNOSIS — N186 End stage renal disease: Secondary | ICD-10-CM | POA: Diagnosis not present

## 2016-06-23 DIAGNOSIS — D631 Anemia in chronic kidney disease: Secondary | ICD-10-CM | POA: Diagnosis not present

## 2016-06-23 DIAGNOSIS — E119 Type 2 diabetes mellitus without complications: Secondary | ICD-10-CM | POA: Diagnosis not present

## 2016-06-23 DIAGNOSIS — N2581 Secondary hyperparathyroidism of renal origin: Secondary | ICD-10-CM | POA: Diagnosis not present

## 2016-06-23 DIAGNOSIS — N186 End stage renal disease: Secondary | ICD-10-CM | POA: Diagnosis not present

## 2016-06-23 DIAGNOSIS — D500 Iron deficiency anemia secondary to blood loss (chronic): Secondary | ICD-10-CM | POA: Diagnosis not present

## 2016-06-23 DIAGNOSIS — L03115 Cellulitis of right lower limb: Secondary | ICD-10-CM | POA: Diagnosis not present

## 2016-06-25 ENCOUNTER — Encounter (HOSPITAL_COMMUNITY)
Admission: RE | Admit: 2016-06-25 | Discharge: 2016-06-25 | Disposition: A | Discharge status: Home / Self Care | Payer: Medicare Other | Source: Ambulatory Visit | Attending: Cardiovascular Disease | Admitting: Cardiovascular Disease

## 2016-06-25 DIAGNOSIS — Z7289 Other problems related to lifestyle: Secondary | ICD-10-CM | POA: Diagnosis not present

## 2016-06-25 DIAGNOSIS — I739 Peripheral vascular disease, unspecified: Secondary | ICD-10-CM | POA: Insufficient documentation

## 2016-06-25 DIAGNOSIS — I480 Paroxysmal atrial fibrillation: Secondary | ICD-10-CM | POA: Insufficient documentation

## 2016-06-25 DIAGNOSIS — I10 Essential (primary) hypertension: Secondary | ICD-10-CM | POA: Insufficient documentation

## 2016-06-25 DIAGNOSIS — Z789 Other specified health status: Secondary | ICD-10-CM

## 2016-06-25 DIAGNOSIS — E1149 Type 2 diabetes mellitus with other diabetic neurological complication: Secondary | ICD-10-CM | POA: Insufficient documentation

## 2016-06-25 DIAGNOSIS — Z8673 Personal history of transient ischemic attack (TIA), and cerebral infarction without residual deficits: Secondary | ICD-10-CM | POA: Diagnosis not present

## 2016-06-25 DIAGNOSIS — I4891 Unspecified atrial fibrillation: Secondary | ICD-10-CM | POA: Diagnosis not present

## 2016-06-25 DIAGNOSIS — I150 Renovascular hypertension: Secondary | ICD-10-CM | POA: Diagnosis not present

## 2016-06-25 LAB — NM MYOCAR MULTI W/SPECT W/WALL MOTION / EF
CHL CUP RESTING HR STRESS: 71 {beats}/min
Peak HR: 81 {beats}/min

## 2016-06-25 MED ORDER — TECHNETIUM TC 99M TETROFOSMIN IV KIT: 30.0000 | PACK | Freq: Once | INTRAVENOUS | Status: DC | PRN

## 2016-06-25 MED ORDER — REGADENOSON 0.4 MG/5ML IV SOLN
INTRAVENOUS | Status: AC
  Filled 2016-06-25: qty 5

## 2016-06-25 MED ORDER — TECHNETIUM TC 99M TETROFOSMIN IV KIT
10.0000 | PACK | Freq: Once | INTRAVENOUS | Status: AC | PRN
  Administered 2016-06-25: 10 via INTRAVENOUS

## 2016-06-25 MED ORDER — REGADENOSON 0.4 MG/5ML IV SOLN
0.4000 mg | Freq: Once | INTRAVENOUS | Status: AC
  Administered 2016-06-25: 0.4 mg via INTRAVENOUS

## 2016-06-26 DIAGNOSIS — D631 Anemia in chronic kidney disease: Secondary | ICD-10-CM | POA: Diagnosis not present

## 2016-06-26 DIAGNOSIS — L03115 Cellulitis of right lower limb: Secondary | ICD-10-CM | POA: Diagnosis not present

## 2016-06-26 DIAGNOSIS — N186 End stage renal disease: Secondary | ICD-10-CM | POA: Diagnosis not present

## 2016-06-26 DIAGNOSIS — D500 Iron deficiency anemia secondary to blood loss (chronic): Secondary | ICD-10-CM | POA: Diagnosis not present

## 2016-06-26 DIAGNOSIS — E119 Type 2 diabetes mellitus without complications: Secondary | ICD-10-CM | POA: Diagnosis not present

## 2016-06-26 DIAGNOSIS — N2581 Secondary hyperparathyroidism of renal origin: Secondary | ICD-10-CM | POA: Diagnosis not present

## 2016-06-28 DIAGNOSIS — S81801A Unspecified open wound, right lower leg, initial encounter: Secondary | ICD-10-CM | POA: Diagnosis not present

## 2016-06-28 DIAGNOSIS — N2581 Secondary hyperparathyroidism of renal origin: Secondary | ICD-10-CM | POA: Diagnosis not present

## 2016-06-28 DIAGNOSIS — N186 End stage renal disease: Secondary | ICD-10-CM | POA: Diagnosis not present

## 2016-06-28 DIAGNOSIS — L03115 Cellulitis of right lower limb: Secondary | ICD-10-CM | POA: Diagnosis not present

## 2016-06-28 DIAGNOSIS — D631 Anemia in chronic kidney disease: Secondary | ICD-10-CM | POA: Diagnosis not present

## 2016-06-28 DIAGNOSIS — D500 Iron deficiency anemia secondary to blood loss (chronic): Secondary | ICD-10-CM | POA: Diagnosis not present

## 2016-06-28 DIAGNOSIS — E119 Type 2 diabetes mellitus without complications: Secondary | ICD-10-CM | POA: Diagnosis not present

## 2016-06-30 DIAGNOSIS — D500 Iron deficiency anemia secondary to blood loss (chronic): Secondary | ICD-10-CM | POA: Diagnosis not present

## 2016-06-30 DIAGNOSIS — N186 End stage renal disease: Secondary | ICD-10-CM | POA: Diagnosis not present

## 2016-06-30 DIAGNOSIS — D631 Anemia in chronic kidney disease: Secondary | ICD-10-CM | POA: Diagnosis not present

## 2016-06-30 DIAGNOSIS — E119 Type 2 diabetes mellitus without complications: Secondary | ICD-10-CM | POA: Diagnosis not present

## 2016-06-30 DIAGNOSIS — L03115 Cellulitis of right lower limb: Secondary | ICD-10-CM | POA: Diagnosis not present

## 2016-06-30 DIAGNOSIS — N2581 Secondary hyperparathyroidism of renal origin: Secondary | ICD-10-CM | POA: Diagnosis not present

## 2016-07-03 DIAGNOSIS — L03115 Cellulitis of right lower limb: Secondary | ICD-10-CM | POA: Diagnosis not present

## 2016-07-03 DIAGNOSIS — N2581 Secondary hyperparathyroidism of renal origin: Secondary | ICD-10-CM | POA: Diagnosis not present

## 2016-07-03 DIAGNOSIS — D500 Iron deficiency anemia secondary to blood loss (chronic): Secondary | ICD-10-CM | POA: Diagnosis not present

## 2016-07-03 DIAGNOSIS — E119 Type 2 diabetes mellitus without complications: Secondary | ICD-10-CM | POA: Diagnosis not present

## 2016-07-03 DIAGNOSIS — N186 End stage renal disease: Secondary | ICD-10-CM | POA: Diagnosis not present

## 2016-07-03 DIAGNOSIS — D631 Anemia in chronic kidney disease: Secondary | ICD-10-CM | POA: Diagnosis not present

## 2016-07-05 DIAGNOSIS — L03115 Cellulitis of right lower limb: Secondary | ICD-10-CM | POA: Diagnosis not present

## 2016-07-05 DIAGNOSIS — D500 Iron deficiency anemia secondary to blood loss (chronic): Secondary | ICD-10-CM | POA: Diagnosis not present

## 2016-07-05 DIAGNOSIS — D631 Anemia in chronic kidney disease: Secondary | ICD-10-CM | POA: Diagnosis not present

## 2016-07-05 DIAGNOSIS — N186 End stage renal disease: Secondary | ICD-10-CM | POA: Diagnosis not present

## 2016-07-05 DIAGNOSIS — E119 Type 2 diabetes mellitus without complications: Secondary | ICD-10-CM | POA: Diagnosis not present

## 2016-07-05 DIAGNOSIS — N2581 Secondary hyperparathyroidism of renal origin: Secondary | ICD-10-CM | POA: Diagnosis not present

## 2016-07-07 DIAGNOSIS — N186 End stage renal disease: Secondary | ICD-10-CM | POA: Diagnosis not present

## 2016-07-07 DIAGNOSIS — N2581 Secondary hyperparathyroidism of renal origin: Secondary | ICD-10-CM | POA: Diagnosis not present

## 2016-07-07 DIAGNOSIS — L03115 Cellulitis of right lower limb: Secondary | ICD-10-CM | POA: Diagnosis not present

## 2016-07-07 DIAGNOSIS — E119 Type 2 diabetes mellitus without complications: Secondary | ICD-10-CM | POA: Diagnosis not present

## 2016-07-07 DIAGNOSIS — D631 Anemia in chronic kidney disease: Secondary | ICD-10-CM | POA: Diagnosis not present

## 2016-07-07 DIAGNOSIS — D500 Iron deficiency anemia secondary to blood loss (chronic): Secondary | ICD-10-CM | POA: Diagnosis not present

## 2016-07-10 DIAGNOSIS — D500 Iron deficiency anemia secondary to blood loss (chronic): Secondary | ICD-10-CM | POA: Diagnosis not present

## 2016-07-10 DIAGNOSIS — N2581 Secondary hyperparathyroidism of renal origin: Secondary | ICD-10-CM | POA: Diagnosis not present

## 2016-07-10 DIAGNOSIS — N186 End stage renal disease: Secondary | ICD-10-CM | POA: Diagnosis not present

## 2016-07-10 DIAGNOSIS — E119 Type 2 diabetes mellitus without complications: Secondary | ICD-10-CM | POA: Diagnosis not present

## 2016-07-10 DIAGNOSIS — D631 Anemia in chronic kidney disease: Secondary | ICD-10-CM | POA: Diagnosis not present

## 2016-07-10 DIAGNOSIS — L03115 Cellulitis of right lower limb: Secondary | ICD-10-CM | POA: Diagnosis not present

## 2016-07-12 DIAGNOSIS — D500 Iron deficiency anemia secondary to blood loss (chronic): Secondary | ICD-10-CM | POA: Diagnosis not present

## 2016-07-12 DIAGNOSIS — L03115 Cellulitis of right lower limb: Secondary | ICD-10-CM | POA: Diagnosis not present

## 2016-07-12 DIAGNOSIS — N2581 Secondary hyperparathyroidism of renal origin: Secondary | ICD-10-CM | POA: Diagnosis not present

## 2016-07-12 DIAGNOSIS — N186 End stage renal disease: Secondary | ICD-10-CM | POA: Diagnosis not present

## 2016-07-12 DIAGNOSIS — Z992 Dependence on renal dialysis: Secondary | ICD-10-CM | POA: Diagnosis not present

## 2016-07-12 DIAGNOSIS — E1122 Type 2 diabetes mellitus with diabetic chronic kidney disease: Secondary | ICD-10-CM | POA: Diagnosis not present

## 2016-07-12 DIAGNOSIS — E119 Type 2 diabetes mellitus without complications: Secondary | ICD-10-CM | POA: Diagnosis not present

## 2016-07-12 DIAGNOSIS — D631 Anemia in chronic kidney disease: Secondary | ICD-10-CM | POA: Diagnosis not present

## 2016-07-14 DIAGNOSIS — N186 End stage renal disease: Secondary | ICD-10-CM | POA: Diagnosis not present

## 2016-07-14 DIAGNOSIS — N2581 Secondary hyperparathyroidism of renal origin: Secondary | ICD-10-CM | POA: Diagnosis not present

## 2016-07-14 DIAGNOSIS — E119 Type 2 diabetes mellitus without complications: Secondary | ICD-10-CM | POA: Diagnosis not present

## 2016-07-14 DIAGNOSIS — L03115 Cellulitis of right lower limb: Secondary | ICD-10-CM | POA: Diagnosis not present

## 2016-07-14 DIAGNOSIS — D500 Iron deficiency anemia secondary to blood loss (chronic): Secondary | ICD-10-CM | POA: Diagnosis not present

## 2016-07-14 DIAGNOSIS — D631 Anemia in chronic kidney disease: Secondary | ICD-10-CM | POA: Diagnosis not present

## 2016-07-17 DIAGNOSIS — D500 Iron deficiency anemia secondary to blood loss (chronic): Secondary | ICD-10-CM | POA: Diagnosis not present

## 2016-07-17 DIAGNOSIS — D631 Anemia in chronic kidney disease: Secondary | ICD-10-CM | POA: Diagnosis not present

## 2016-07-17 DIAGNOSIS — E119 Type 2 diabetes mellitus without complications: Secondary | ICD-10-CM | POA: Diagnosis not present

## 2016-07-17 DIAGNOSIS — N186 End stage renal disease: Secondary | ICD-10-CM | POA: Diagnosis not present

## 2016-07-17 DIAGNOSIS — L03115 Cellulitis of right lower limb: Secondary | ICD-10-CM | POA: Diagnosis not present

## 2016-07-17 DIAGNOSIS — N2581 Secondary hyperparathyroidism of renal origin: Secondary | ICD-10-CM | POA: Diagnosis not present

## 2016-07-19 DIAGNOSIS — L03115 Cellulitis of right lower limb: Secondary | ICD-10-CM | POA: Diagnosis not present

## 2016-07-19 DIAGNOSIS — D500 Iron deficiency anemia secondary to blood loss (chronic): Secondary | ICD-10-CM | POA: Diagnosis not present

## 2016-07-19 DIAGNOSIS — N186 End stage renal disease: Secondary | ICD-10-CM | POA: Diagnosis not present

## 2016-07-19 DIAGNOSIS — N2581 Secondary hyperparathyroidism of renal origin: Secondary | ICD-10-CM | POA: Diagnosis not present

## 2016-07-19 DIAGNOSIS — D631 Anemia in chronic kidney disease: Secondary | ICD-10-CM | POA: Diagnosis not present

## 2016-07-19 DIAGNOSIS — E119 Type 2 diabetes mellitus without complications: Secondary | ICD-10-CM | POA: Diagnosis not present

## 2016-07-21 DIAGNOSIS — N2581 Secondary hyperparathyroidism of renal origin: Secondary | ICD-10-CM | POA: Diagnosis not present

## 2016-07-21 DIAGNOSIS — N186 End stage renal disease: Secondary | ICD-10-CM | POA: Diagnosis not present

## 2016-07-21 DIAGNOSIS — D631 Anemia in chronic kidney disease: Secondary | ICD-10-CM | POA: Diagnosis not present

## 2016-07-21 DIAGNOSIS — E119 Type 2 diabetes mellitus without complications: Secondary | ICD-10-CM | POA: Diagnosis not present

## 2016-07-21 DIAGNOSIS — L03115 Cellulitis of right lower limb: Secondary | ICD-10-CM | POA: Diagnosis not present

## 2016-07-21 DIAGNOSIS — D500 Iron deficiency anemia secondary to blood loss (chronic): Secondary | ICD-10-CM | POA: Diagnosis not present

## 2016-07-24 DIAGNOSIS — N186 End stage renal disease: Secondary | ICD-10-CM | POA: Diagnosis not present

## 2016-07-24 DIAGNOSIS — D631 Anemia in chronic kidney disease: Secondary | ICD-10-CM | POA: Diagnosis not present

## 2016-07-24 DIAGNOSIS — E119 Type 2 diabetes mellitus without complications: Secondary | ICD-10-CM | POA: Diagnosis not present

## 2016-07-24 DIAGNOSIS — D500 Iron deficiency anemia secondary to blood loss (chronic): Secondary | ICD-10-CM | POA: Diagnosis not present

## 2016-07-24 DIAGNOSIS — N2581 Secondary hyperparathyroidism of renal origin: Secondary | ICD-10-CM | POA: Diagnosis not present

## 2016-07-24 DIAGNOSIS — L03115 Cellulitis of right lower limb: Secondary | ICD-10-CM | POA: Diagnosis not present

## 2016-07-25 ENCOUNTER — Encounter (HOSPITAL_BASED_OUTPATIENT_CLINIC_OR_DEPARTMENT_OTHER): Payer: Medicare Other

## 2016-07-26 ENCOUNTER — Observation Stay (HOSPITAL_COMMUNITY)
Admission: EM | Admit: 2016-07-26 | Discharge: 2016-07-30 | Disposition: A | Discharge status: Home / Self Care | Payer: Medicare Other | Attending: Internal Medicine | Admitting: Internal Medicine

## 2016-07-26 ENCOUNTER — Emergency Department (HOSPITAL_COMMUNITY): Payer: Medicare Other

## 2016-07-26 ENCOUNTER — Encounter (HOSPITAL_COMMUNITY): Payer: Self-pay | Admitting: *Deleted

## 2016-07-26 ENCOUNTER — Observation Stay (HOSPITAL_COMMUNITY): Payer: Medicare Other

## 2016-07-26 DIAGNOSIS — L03115 Cellulitis of right lower limb: Secondary | ICD-10-CM | POA: Diagnosis not present

## 2016-07-26 DIAGNOSIS — Z87891 Personal history of nicotine dependence: Secondary | ICD-10-CM | POA: Insufficient documentation

## 2016-07-26 DIAGNOSIS — Z8673 Personal history of transient ischemic attack (TIA), and cerebral infarction without residual deficits: Secondary | ICD-10-CM | POA: Diagnosis not present

## 2016-07-26 DIAGNOSIS — I509 Heart failure, unspecified: Secondary | ICD-10-CM | POA: Diagnosis not present

## 2016-07-26 DIAGNOSIS — Z992 Dependence on renal dialysis: Secondary | ICD-10-CM | POA: Diagnosis not present

## 2016-07-26 DIAGNOSIS — I10 Essential (primary) hypertension: Secondary | ICD-10-CM | POA: Diagnosis not present

## 2016-07-26 DIAGNOSIS — E1140 Type 2 diabetes mellitus with diabetic neuropathy, unspecified: Secondary | ICD-10-CM | POA: Diagnosis not present

## 2016-07-26 DIAGNOSIS — E119 Type 2 diabetes mellitus without complications: Secondary | ICD-10-CM | POA: Diagnosis not present

## 2016-07-26 DIAGNOSIS — N2581 Secondary hyperparathyroidism of renal origin: Secondary | ICD-10-CM | POA: Diagnosis not present

## 2016-07-26 DIAGNOSIS — I639 Cerebral infarction, unspecified: Principal | ICD-10-CM | POA: Insufficient documentation

## 2016-07-26 DIAGNOSIS — R4781 Slurred speech: Secondary | ICD-10-CM | POA: Diagnosis not present

## 2016-07-26 DIAGNOSIS — I6789 Other cerebrovascular disease: Secondary | ICD-10-CM | POA: Diagnosis not present

## 2016-07-26 DIAGNOSIS — Z794 Long term (current) use of insulin: Secondary | ICD-10-CM | POA: Insufficient documentation

## 2016-07-26 DIAGNOSIS — E1149 Type 2 diabetes mellitus with other diabetic neurological complication: Secondary | ICD-10-CM | POA: Diagnosis present

## 2016-07-26 DIAGNOSIS — M6281 Muscle weakness (generalized): Secondary | ICD-10-CM | POA: Diagnosis not present

## 2016-07-26 DIAGNOSIS — N186 End stage renal disease: Secondary | ICD-10-CM | POA: Insufficient documentation

## 2016-07-26 DIAGNOSIS — Z7982 Long term (current) use of aspirin: Secondary | ICD-10-CM | POA: Diagnosis not present

## 2016-07-26 DIAGNOSIS — H538 Other visual disturbances: Secondary | ICD-10-CM

## 2016-07-26 DIAGNOSIS — Z79899 Other long term (current) drug therapy: Secondary | ICD-10-CM | POA: Diagnosis not present

## 2016-07-26 DIAGNOSIS — I132 Hypertensive heart and chronic kidney disease with heart failure and with stage 5 chronic kidney disease, or end stage renal disease: Secondary | ICD-10-CM | POA: Insufficient documentation

## 2016-07-26 DIAGNOSIS — Z91040 Latex allergy status: Secondary | ICD-10-CM | POA: Diagnosis not present

## 2016-07-26 DIAGNOSIS — Z7901 Long term (current) use of anticoagulants: Secondary | ICD-10-CM | POA: Insufficient documentation

## 2016-07-26 DIAGNOSIS — G40909 Epilepsy, unspecified, not intractable, without status epilepticus: Secondary | ICD-10-CM

## 2016-07-26 DIAGNOSIS — D500 Iron deficiency anemia secondary to blood loss (chronic): Secondary | ICD-10-CM | POA: Diagnosis not present

## 2016-07-26 DIAGNOSIS — I63133 Cerebral infarction due to embolism of bilateral carotid arteries: Secondary | ICD-10-CM | POA: Diagnosis not present

## 2016-07-26 DIAGNOSIS — I63332 Cerebral infarction due to thrombosis of left posterior cerebral artery: Secondary | ICD-10-CM | POA: Diagnosis not present

## 2016-07-26 DIAGNOSIS — R2990 Unspecified symptoms and signs involving the nervous system: Secondary | ICD-10-CM

## 2016-07-26 DIAGNOSIS — D631 Anemia in chronic kidney disease: Secondary | ICD-10-CM | POA: Diagnosis not present

## 2016-07-26 DIAGNOSIS — I480 Paroxysmal atrial fibrillation: Secondary | ICD-10-CM | POA: Diagnosis present

## 2016-07-26 DIAGNOSIS — E1122 Type 2 diabetes mellitus with diabetic chronic kidney disease: Secondary | ICD-10-CM | POA: Diagnosis not present

## 2016-07-26 DIAGNOSIS — H539 Unspecified visual disturbance: Secondary | ICD-10-CM | POA: Diagnosis not present

## 2016-07-26 DIAGNOSIS — I6930 Unspecified sequelae of cerebral infarction: Secondary | ICD-10-CM

## 2016-07-26 DIAGNOSIS — I120 Hypertensive chronic kidney disease with stage 5 chronic kidney disease or end stage renal disease: Secondary | ICD-10-CM | POA: Diagnosis not present

## 2016-07-26 DIAGNOSIS — R079 Chest pain, unspecified: Secondary | ICD-10-CM

## 2016-07-26 DIAGNOSIS — H5340 Unspecified visual field defects: Secondary | ICD-10-CM | POA: Diagnosis present

## 2016-07-26 DIAGNOSIS — Z86711 Personal history of pulmonary embolism: Secondary | ICD-10-CM | POA: Diagnosis present

## 2016-07-26 DIAGNOSIS — R4182 Altered mental status, unspecified: Secondary | ICD-10-CM | POA: Diagnosis not present

## 2016-07-26 DIAGNOSIS — R0789 Other chest pain: Secondary | ICD-10-CM | POA: Diagnosis not present

## 2016-07-26 LAB — COMPREHENSIVE METABOLIC PANEL
ALBUMIN: 3.4 g/dL — AB (ref 3.5–5.0)
ALK PHOS: 230 U/L — AB (ref 38–126)
ALT: 37 U/L (ref 14–54)
AST: 30 U/L (ref 15–41)
Anion gap: 9 (ref 5–15)
BILIRUBIN TOTAL: 0.5 mg/dL (ref 0.3–1.2)
BUN: 42 mg/dL — AB (ref 6–20)
CO2: 26 mmol/L (ref 22–32)
Calcium: 8.5 mg/dL — ABNORMAL LOW (ref 8.9–10.3)
Chloride: 103 mmol/L (ref 101–111)
Creatinine, Ser: 3.28 mg/dL — ABNORMAL HIGH (ref 0.44–1.00)
GFR calc Af Amer: 15 mL/min — ABNORMAL LOW (ref >60)
GFR calc non Af Amer: 13 mL/min — ABNORMAL LOW (ref >60)
GLUCOSE: 156 mg/dL — AB (ref 65–99)
Potassium: 3.9 mmol/L (ref 3.5–5.1)
Sodium: 138 mmol/L (ref 135–145)
TOTAL PROTEIN: 6.7 g/dL (ref 6.5–8.1)

## 2016-07-26 LAB — I-STAT CHEM 8, ED
BUN: 42 mg/dL — AB (ref 6–20)
CREATININE: 3.3 mg/dL — AB (ref 0.44–1.00)
Calcium, Ion: 1.01 mmol/L — ABNORMAL LOW (ref 1.15–1.40)
Chloride: 100 mmol/L — ABNORMAL LOW (ref 101–111)
GLUCOSE: 153 mg/dL — AB (ref 65–99)
HEMATOCRIT: 36 % (ref 36.0–46.0)
Hemoglobin: 12.2 g/dL (ref 12.0–15.0)
Potassium: 3.8 mmol/L (ref 3.5–5.1)
Sodium: 139 mmol/L (ref 135–145)
TCO2: 27 mmol/L (ref 0–100)

## 2016-07-26 LAB — ETHANOL: Alcohol, Ethyl (B): <5 mg/dL (ref <5)

## 2016-07-26 LAB — CBC
HEMATOCRIT: 35.4 % — AB (ref 36.0–46.0)
HEMOGLOBIN: 11.1 g/dL — AB (ref 12.0–15.0)
MCH: 30.2 pg (ref 26.0–34.0)
MCHC: 31.4 g/dL (ref 30.0–36.0)
MCV: 96.5 fL (ref 78.0–100.0)
Platelets: 166 10*3/uL (ref 150–400)
RBC: 3.67 MIL/uL — AB (ref 3.87–5.11)
RDW: 16.3 % — ABNORMAL HIGH (ref 11.5–15.5)
WBC: 6.2 10*3/uL (ref 4.0–10.5)

## 2016-07-26 LAB — DIFFERENTIAL
BASOS ABS: 0 10*3/uL (ref 0.0–0.1)
Basophils Relative: 0 %
EOS PCT: 6 %
Eosinophils Absolute: 0.4 10*3/uL (ref 0.0–0.7)
LYMPHS ABS: 1.3 10*3/uL (ref 0.7–4.0)
LYMPHS PCT: 21 %
Monocytes Absolute: 0.8 10*3/uL (ref 0.1–1.0)
Monocytes Relative: 13 %
NEUTROS PCT: 60 %
Neutro Abs: 3.7 10*3/uL (ref 1.7–7.7)

## 2016-07-26 LAB — CBG MONITORING, ED: Glucose-Capillary: 166 mg/dL — ABNORMAL HIGH (ref 65–99)

## 2016-07-26 LAB — PROTIME-INR
INR: 1.22
Prothrombin Time: 15.5 seconds — ABNORMAL HIGH (ref 11.4–15.2)

## 2016-07-26 LAB — I-STAT TROPONIN, ED: Troponin i, poc: 0.15 ng/mL (ref 0.00–0.08)

## 2016-07-26 LAB — GLUCOSE, CAPILLARY: Glucose-Capillary: 104 mg/dL — ABNORMAL HIGH (ref 65–99)

## 2016-07-26 LAB — APTT: aPTT: >200 seconds (ref 24–36)

## 2016-07-26 MED ORDER — APIXABAN 5 MG PO TABS
5.0000 mg | ORAL_TABLET | Freq: Every day | ORAL | Status: DC
  Administered 2016-07-26 – 2016-07-29 (×4): 5 mg via ORAL
  Filled 2016-07-26 (×4): qty 1

## 2016-07-26 MED ORDER — GABAPENTIN 100 MG PO CAPS
100.0000 mg | ORAL_CAPSULE | Freq: Three times a day (TID) | ORAL | Status: DC
  Administered 2016-07-26 – 2016-07-30 (×9): 100 mg via ORAL
  Filled 2016-07-26 (×10): qty 1

## 2016-07-26 MED ORDER — STROKE: EARLY STAGES OF RECOVERY BOOK
Freq: Once | Status: DC
  Filled 2016-07-26: qty 1

## 2016-07-26 MED ORDER — INSULIN DETEMIR 100 UNIT/ML ~~LOC~~ SOLN
5.0000 [IU] | Freq: Every day | SUBCUTANEOUS | Status: DC
  Administered 2016-07-26: 5 [IU] via SUBCUTANEOUS
  Filled 2016-07-26 (×2): qty 0.05

## 2016-07-26 MED ORDER — INSULIN ASPART 100 UNIT/ML ~~LOC~~ SOLN
0.0000 [IU] | SUBCUTANEOUS | Status: DC
  Administered 2016-07-27: 3 [IU] via SUBCUTANEOUS
  Administered 2016-07-28 – 2016-07-29 (×3): 2 [IU] via SUBCUTANEOUS
  Administered 2016-07-29: 1 [IU] via SUBCUTANEOUS
  Administered 2016-07-30: 2 [IU] via SUBCUTANEOUS

## 2016-07-26 MED ORDER — CALCIUM ACETATE (PHOS BINDER) 667 MG PO CAPS
667.0000 mg | ORAL_CAPSULE | Freq: Three times a day (TID) | ORAL | Status: DC
  Administered 2016-07-27 – 2016-07-30 (×10): 667 mg via ORAL
  Filled 2016-07-26 (×9): qty 1

## 2016-07-26 MED ORDER — ASPIRIN EC 325 MG PO TBEC
325.0000 mg | DELAYED_RELEASE_TABLET | Freq: Every day | ORAL | Status: DC
  Administered 2016-07-26 – 2016-07-30 (×4): 325 mg via ORAL
  Filled 2016-07-26 (×5): qty 1

## 2016-07-26 MED ORDER — RENA-VITE PO TABS
1.0000 | ORAL_TABLET | Freq: Every day | ORAL | Status: DC
  Administered 2016-07-26 – 2016-07-29 (×4): 1 via ORAL
  Filled 2016-07-26 (×4): qty 1

## 2016-07-26 NOTE — H&P (Signed)
History and Physical    Deborah Hendricks [id number] DOB: 09-Mar-1946 DOA: 07/26/2016   PCP: Rexene Agent, MD   Patient coming from/Resides with: Private residence/this with family  Admission status: Observation/telemetry  Chief Complaint: Altered mental status with facial drooping slurred speech and visual disturbance  HPI: Deborah Hendricks is a 70 y.o. 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 Patient was undergoing dialysis in the outpatient setting today and had completed about one half of her treatment when she was noticed to experienced abrupt onset change of mental status with facial drooping and slurred speech. Her blood pressure also dropped into the 70 systolic and quickly rebounded into the 180s. Unclear if she was given a fluid bolus while on the dialysis machine. Patient was brought to the ER as a code stroke. Initial CT head unremarkable. Stat MRI MRA obtained. Patient's NIHSS was 4. She was not a candidate for TPA secondary to eliquis administration within 48 hours of presentation. She was evaluated by Dr. Leonel Ramsay with neurology. MRA did reveal severe stenosis of the left PCA versus occlusion and on exam patient was found by neurology to have right hemianopia and mild latency of speech. Neurology also felt that vascular intervention to the left PCA would not be in her favor since her symptoms were mild and she has severe vascular disease. This was discussed with the patient's daughter who verbalized understanding (see neurology note)  Upon my evaluation of the patient, she was alert and verbalized she felt she had essentially returned to baseline  except for persistent right visual changes and some difficulty in speaking.  ED Course:  Vital Signs: BP 147/85   Pulse 71   Resp 13   Ht 5\' 6"  (1.676 m)   Wt 65.8 kg (145 lb)   SpO2 100%   BMI 23.40 kg/m  CT head coach stroke protocol without contrast: No evidence for acute hemorrhage, mass lesion hydrocephalus or extra-axial fluid, also findings which are suggestive of chronic microvascular ischemic change. Unable to exclude early infarct in the left internal capsule/centrum semiovale MRI/MRA brain without contrast: Negative for emergent large vessel occlusion, intermittently motion degraded intracranial MRA, there was moderate to severe stenosis of the distal left PCA which possibly was exaggerated due to motion artifact; difficult to exclude small acute lacunar infarcts in the right corona radialis and left caudate Lab data: Sodium 138, potassium 3.9, chloride 103, CO2 26, BUN 42, creatinine 3.28, glucose 156, alkaline phosphatase 2:30, albumin 3.4, poc troponin 0.15, WBC 6200 normal differential, hemoglobin 11.1, platelets 166,000, PT 15.5, INR 1.22,  PTT greater than 200 (?? Effect of dialysis heparin), ethyl alcohol less than 5 Medications and treatments: None  Review of Systems:  In addition to the HPI above,  No Fever-chills, myalgias or other constitutional symptoms No Headache, changes with hearing, tingling, numbness in any extremity, patient reports transient worsening of her known right side weakness with symptoms today-is also observed with facial drooping and worsening of her baseline aphasia-she also has new visual disturbance involving the right eye No problems swallowing food or Liquids, indigestion/reflux No Chest pain, Cough or Shortness of Breath, palpitations, orthopnea or DOE No Abdominal pain, N/V; no melena or hematochezia, no dark tarry stools No dysuria, hematuria or flank pain No  new skin rashes, lesions, masses or bruises, No new joints pains-aches No recent  weight gain or loss No polyuria, polydypsia or polyphagia,   Past Medical History:  Diagnosis Date  . Anemia   . Anxiety   . CHF (congestive heart failure) ([place])   . Constipation   . Critical lower limb ischemia   . Depression   . Diabetes mellitus without complication ([place])   . Hypertension   . Kidney disease    ESRD  . Memory loss    since stroke 04/2015  . Peripheral vascular disease ([place])   . Seizures ([place]) 04/2015   since   . Stroke ([place]) 04/2015   unable to stand .  can have diffuuclty with speech at times    Past Surgical History:  Procedure Laterality Date  . ABDOMINAL HYSTERECTOMY    . AMPUTATION Left 12/15/2015   Procedure: LEFT ABOVE KNEE AMPUTATION;  Surgeon: Serafina Mitchell, MD;  Location: [place];  Service: Vascular;  Laterality: Left;  . BASCILIC VEIN TRANSPOSITION Right 11/25/2015   Procedure: Right Arm FIRST STAGE BASILIC VEIN TRANSPOSITION;  Surgeon: Serafina Mitchell, MD;  Location: [place];  Service: Vascular;  Laterality: Right;  . BASCILIC VEIN TRANSPOSITION Right 01/19/2016   Procedure: RIGHT SECOND STAGE BASILIC VEIN TRANSPOSITION;  Surgeon: Serafina Mitchell, MD;  Location: ;  Service: Vascular;  Laterality: Right;  . PERIPHERAL VASCULAR CATHETERIZATION Bilateral 11/28/2015   Procedure: Lower Extremity Angiography;  Surgeon: Lorretta Harp, MD;  Location: [place] [facility];  Service: Cardiovascular;  Laterality: Bilateral;  . PERIPHERAL VASCULAR CATHETERIZATION N/A 11/28/2015   Procedure: Abdominal Aortogram;  Surgeon: Lorretta Harp, MD;  Location: [place] [facility];  Service: Cardiovascular;  Laterality: N/A;  . TOE AMPUTATION     R foot  . TONSILLECTOMY      Social History   Social History  . Marital status: Single    Spouse name: N/A  . Number of children: N/A  . Years of education: N/A   Occupational History  . Not on file.   Social History Main Topics  . Smoking status: Former Smoker    Quit date: 11/17/2013  . Smokeless tobacco: Never  Used  . Alcohol use No  . Drug use: No  . Sexual activity: Not on file   Other Topics Concern  . Not on file   Social History Narrative  . No narrative on file    Mobility: Wheelchair-has left lower extremity prosthesis but is too long and has to be readjusted Work history: Disabled   Allergies  Allergen Reactions  . Penicillins Hives, Itching and Rash    Has patient had a PCN reaction causing immediate rash, facial/tongue/throat swelling, SOB or lightheadedness with hypotension: Yes Has patient had a PCN reaction causing severe rash involving mucus membranes or skin necrosis:  Has patient had a PCN reaction that required hospitalization  Has patient had a PCN reaction occurring within the last 10 years:  If all of the above answers are "NO", then may proceed with Cephalosporin use.  [name] Amoxicillin Other (See Comments)    Unspecified Per MAR  . Latex Other (See Comments)    Unspecified Per MAR  . Sulfa Antibiotics Other (See Comments)    Unspecified Per MAR    Family History  Problem Relation Age of Onset  . Asthma Mother   . Cancer Sister    Family history reviewed and not pertinent   Prior to Admission medications   Medication Sig Start  Date End Date Taking? Authorizing Provider  apixaban (ELIQUIS) 5 MG TABS tablet Take 5 mg by mouth daily.   Yes Historical Provider, MD  aspirin EC 325 MG tablet Take 325 mg by mouth daily.   Yes Historical Provider, MD  calcium acetate (PHOSLO) 667 MG capsule Take 667 mg by mouth 3 (three) times daily with meals.   Yes Historical Provider, MD  carvedilol (COREG) 12.5 MG tablet Take 12.5 mg by mouth 2 (two) times daily.    Yes Historical Provider, MD  gabapentin (NEURONTIN) 100 MG capsule Take 100 mg by mouth 3 (three) times daily.   Yes Historical Provider, MD  insulin aspart (NOVOLOG) 100 UNIT/ML injection Inject 2-10 Units into the skin 3 (three) times daily before meals. 151-200=2 units, 201-250=4 units, 251-300=6 units, 301-350=8  units, 351-400=10 units, >400=10 units and Call MD   Yes Historical Provider, MD  insulin detemir (LEVEMIR) 100 UNIT/ML injection Inject 5 Units into the skin daily.    Yes Historical Provider, MD  multivitamin (RENA-VIT) TABS tablet Take 1 tablet by mouth daily.   Yes Historical Provider, MD  Nutritional Supplements (FEEDING SUPPLEMENT, NEPRO CARB STEADY,) LIQD Take 237 mLs by mouth 2 (two) times daily between meals.   Yes Historical Provider, MD  ondansetron (ZOFRAN) 4 MG tablet Take 4 mg by mouth every 8 (eight) hours as needed for nausea or vomiting.   Yes Historical Provider, MD  doxycycline (VIBRA-TABS) 100 MG tablet Take 100 mg by mouth 2 (two) times daily. 07/10/16   Historical Provider, MD    Physical Exam: Vitals:   07/26/16 1545 07/26/16 1557 07/26/16 1600 07/26/16 1630  BP: 182/74 182/74 153/82 147/85  Pulse: 67 65 73 71  Resp: 15 18 16 13   SpO2: 98% 100% 99% 100%  Weight:      Height:          Constitutional: NAD, calm, comfortable Eyes: PERRL, lids and conjunctivae normal ENMT: Mucous membranes are moist. Posterior pharynx clear of any exudate or lesions.Normal dentition.  Neck: normal, supple, no masses, no thyromegaly Respiratory: clear to auscultation bilaterally, no wheezing, no crackles. Normal respiratory effort. No accessory muscle use.  Cardiovascular: Mostly regular rate and rhythm although occasionally has slowing of her rate typical of sinus arrhythmia vs  very brief sinus pause, high-pitched grade 2/6 systolic murmur- no rubs / gallops. No extremity edema. 2+ pedal pulses. No carotid bruits.  Abdomen: no tenderness, no masses palpated. No hepatosplenomegaly. Bowel sounds positive.  Musculoskeletal: no clubbing / cyanosis. No joint deformity upper and lower extremities. Good ROM, no contractures. Normal muscle tone. PermCath dialysis access right anterior thigh  Skin: no rashes, lesions, ulcers. No induration Neurologic: CN 2-12 grossly intact except for reported  right eye visual disturbance (documented by neurology as hemianopia). Sensation intact, DTR normal. Strength 5/5 left upper extremity-left lower extremity testing limited secondary to prior amputation. Strength on right 4/5-some minor expressive aphasia the patient for the most part is appropriate Psychiatric: Normal judgment and insight. Alert and oriented x 3. Normal mood.    Labs on Admission: I have personally reviewed following labs and imaging studies  CBC:  Recent Labs Lab 07/26/16 1347 07/26/16 1356  WBC 6.2  --   NEUTROABS 3.7  --   HGB 11.1* 12.2  HCT 35.4* 36.0  MCV 96.5  --   PLT 166  --    Basic Metabolic Panel:  Recent Labs Lab 07/26/16 1347 07/26/16 1356  NA 138 139  K 3.9 3.8  CL 103 100*  CO2 26  --   GLUCOSE 156* 153*  BUN 42* 42*  CREATININE 3.28* 3.30*  CALCIUM 8.5*  --    GFR: Estimated Creatinine Clearance: 14.6 mL/min (by C-G formula based on SCr of 3.3 mg/dL (H)). Liver Function Tests:  Recent Labs Lab 07/26/16 1347  AST 30  ALT 37  ALKPHOS 230*  BILITOT 0.5  PROT 6.7  ALBUMIN 3.4*   No results for input(s): LIPASE, AMYLASE in the last 168 hours. No results for input(s): AMMONIA in the last 168 hours. Coagulation Profile:  Recent Labs Lab 07/26/16 1347  INR 1.22   Cardiac Enzymes: No results for input(s): CKTOTAL, CKMB, CKMBINDEX, TROPONINI in the last 168 hours. BNP (last 3 results) No results for input(s): PROBNP in the last 8760 hours. HbA1C: No results for input(s): HGBA1C in the last 72 hours. CBG:  Recent Labs Lab 07/26/16 1347  GLUCAP 166*   Lipid Profile: No results for input(s): CHOL, HDL, LDLCALC, TRIG, CHOLHDL, LDLDIRECT in the last 72 hours. Thyroid Function Tests: No results for input(s): TSH, T4TOTAL, FREET4, T3FREE, THYROIDAB in the last 72 hours. Anemia Panel: No results for input(s): VITAMINB12, FOLATE, FERRITIN, TIBC, IRON, RETICCTPCT in the last 72 hours. Urine analysis:    Component Value  Date/Time   COLORURINE YELLOW 01/28/2014 1129   APPEARANCEUR CLEAR 01/28/2014 1129   LABSPEC 1.012 01/28/2014 1129   PHURINE 6.0 01/28/2014 1129   GLUCOSEU NEGATIVE 01/28/2014 1129   HGBUR NEGATIVE 01/28/2014 1129   BILIRUBINUR NEGATIVE 01/28/2014 1129   KETONESUR NEGATIVE 01/28/2014 1129   PROTEINUR 100 (A) 01/28/2014 1129   UROBILINOGEN 0.2 01/28/2014 1129   NITRITE NEGATIVE 01/28/2014 1129   LEUKOCYTESUR TRACE (A) 01/28/2014 1129   Sepsis Labs: @LABRCNTIP (procalcitonin:4,lacticidven:4) )No results found for this or any previous visit (from the past 240 hour(s)).   Radiological Exams on Admission: Mr Virgel Paling [id number] Contrast  Addendum Date: 07/26/2016   ADDENDUM REPORT: 07/26/2016 15:13 ADDENDUM: Study discussed by telephone with Dr. Roland Rack on 07/26/2016 at 1507 hours. Electronically Signed   By: Genevie Ann M.D.   On: 07/26/2016 15:13   Result Date: 07/26/2016 CLINICAL DATA:  70 year old female code stroke. Acute onset slurred speech. Initial encounter. Limited MRI protocol requested by Neurology to include only axial and coronal diffusion. EXAM: MRI HEAD WITHOUT CONTRAST LIMITED MRA HEAD WITHOUT CONTRAST TECHNIQUE: Multiplanar, multiecho pulse sequences of the brain and surrounding structures were obtained without intravenous contrast. Angiographic images of the head were obtained using MRA technique without contrast. COMPARISON:  Head CT without contrast 1347 hours today. FINDINGS: MRI HEAD FINDINGS Heterogeneity along the posterior limbs of both internal capsules, corresponding to heterogeneous hypodensity on the earlier CT. Facilitated diffusion in these areas indicating multiple chronic lacunar infarcts. Probable superimposed chronic microhemorrhage accounting for some of the trace diffusion heterogeneity. Overall it is difficult to exclude tiny areas of restricted diffusion along the right corona radiata (series 3, image 25) and perhaps also the left caudate (as seen on series 4,  image 17). No restricted diffusion elsewhere. No intracranial mass effect. No ventriculomegaly. MRA HEAD FINDINGS Antegrade flow in the posterior circulation with codominant distal vertebral arteries. Mild distal vertebral irregularity without stenosis. Right PICA is patent. Patent vertebrobasilar junction and bilateral AICA origins. No basilar stenosis. Normal SCA and PCA origins. Mild motion artifact at the level of the posterior communicating arteries, the left appears to be present. The right could be diminutive or absent. Motion artifact also appears to exaggerate signal loss in the distal left PCA which  appears better preserved on source images. No proximal PCA occlusion. Antegrade flow in both ICA siphons. Moderate cavernous and proximal supraclinoid siphon irregularity. There probably is an atherosclerotic pseudo lesion of the left cavernous carotid directed inferiorly and laterally on series 5, image 83 (and series 503, image 8). Both carotid termini are patent. MCA and ACA origins are patent. Motion artifact affecting the M1 and A1 vessels which appear to remain normal. Bilateral MCA bifurcations are patent. Motion artifact continues into the second order vessels. No M2 occlusion identified. Visualized ACA branches appear within normal limits. IMPRESSION: 1. Negative for emergent large vessel occlusion. 2. Intermittently motion degraded intracranial MRA. Moderate to severe stenosis of the distal left PCA which may be exaggerated due to motion artifact. Extensive bilateral ICA siphon atherosclerosis with up to moderate siphon stenosis. Probable left cavernous segment atherosclerotic pseudo lesion (vs. a small 2 mm cavernous segment aneurysm). 3. Diffusion weighted imaging is heterogeneous in the posterior deep gray matter suggesting the presence of chronic small vessel ischemia with microhemorrhage. 4. Difficult to exclude small acute lacunar infarcts of the right corona radiata, and left caudate.  Electronically Signed: By: Genevie Ann M.D. On: 07/26/2016 15:05   Mr Brain Ltd W/o Cm  Addendum Date: 07/26/2016   ADDENDUM REPORT: 07/26/2016 15:13 ADDENDUM: Study discussed by telephone with Dr. Roland Rack on 07/26/2016 at 1507 hours. Electronically Signed   By: Genevie Ann M.D.   On: 07/26/2016 15:13   Result Date: 07/26/2016 CLINICAL DATA:  70 year old female code stroke. Acute onset slurred speech. Initial encounter. Limited MRI protocol requested by Neurology to include only axial and coronal diffusion. EXAM: MRI HEAD WITHOUT CONTRAST LIMITED MRA HEAD WITHOUT CONTRAST TECHNIQUE: Multiplanar, multiecho pulse sequences of the brain and surrounding structures were obtained without intravenous contrast. Angiographic images of the head were obtained using MRA technique without contrast. COMPARISON:  Head CT without contrast 1347 hours today. FINDINGS: MRI HEAD FINDINGS Heterogeneity along the posterior limbs of both internal capsules, corresponding to heterogeneous hypodensity on the earlier CT. Facilitated diffusion in these areas indicating multiple chronic lacunar infarcts. Probable superimposed chronic microhemorrhage accounting for some of the trace diffusion heterogeneity. Overall it is difficult to exclude tiny areas of restricted diffusion along the right corona radiata (series 3, image 25) and perhaps also the left caudate (as seen on series 4, image 17). No restricted diffusion elsewhere. No intracranial mass effect. No ventriculomegaly. MRA HEAD FINDINGS Antegrade flow in the posterior circulation with codominant distal vertebral arteries. Mild distal vertebral irregularity without stenosis. Right PICA is patent. Patent vertebrobasilar junction and bilateral AICA origins. No basilar stenosis. Normal SCA and PCA origins. Mild motion artifact at the level of the posterior communicating arteries, the left appears to be present. The right could be diminutive or absent. Motion artifact also appears to  exaggerate signal loss in the distal left PCA which appears better preserved on source images. No proximal PCA occlusion. Antegrade flow in both ICA siphons. Moderate cavernous and proximal supraclinoid siphon irregularity. There probably is an atherosclerotic pseudo lesion of the left cavernous carotid directed inferiorly and laterally on series 5, image 83 (and series 503, image 8). Both carotid termini are patent. MCA and ACA origins are patent. Motion artifact affecting the M1 and A1 vessels which appear to remain normal. Bilateral MCA bifurcations are patent. Motion artifact continues into the second order vessels. No M2 occlusion identified. Visualized ACA branches appear within normal limits. IMPRESSION: 1. Negative for emergent large vessel occlusion. 2. Intermittently motion degraded intracranial  MRA. Moderate to severe stenosis of the distal left PCA which may be exaggerated due to motion artifact. Extensive bilateral ICA siphon atherosclerosis with up to moderate siphon stenosis. Probable left cavernous segment atherosclerotic pseudo lesion (vs. a small 2 mm cavernous segment aneurysm). 3. Diffusion weighted imaging is heterogeneous in the posterior deep gray matter suggesting the presence of chronic small vessel ischemia with microhemorrhage. 4. Difficult to exclude small acute lacunar infarcts of the right corona radiata, and left caudate. Electronically Signed: By: Genevie Ann M.D. On: 07/26/2016 15:05   Ct Head Code Stroke Wo Contrast`  Result Date: 07/26/2016 CLINICAL DATA:  Code stroke. Difficulty speaking, change in mental status. EXAM: CT HEAD WITHOUT CONTRAST TECHNIQUE: Contiguous axial images were obtained from the base of the skull through the vertex without intravenous contrast. COMPARISON:  None. FINDINGS: No evidence for acute hemorrhage, mass lesion, hydrocephalus, or extra-axial fluid. Mild atrophy. Moderate hypoattenuation of white matter suggesting chronic microvascular ischemic change.  BILATERAL lacunar infarcts affect the posterior limb internal capsule on the RIGHT and thalamus on the LEFT. Asymmetric hypodensity of the LEFT internal capsule/ centrum semiovale (coronal image 36 series 203, axial image 14-15 series 201) could represent acute infarction. No visible cortical hypodensities. Advanced vascular calcification in the carotid siphons and supraclinoid internal carotid artery segments bilaterally. No hyperdense ICA terminus or M1 MCA. The calvarium is intact.  No sinus or mastoid disease. ASPECTS [facility] Stroke Program Early CT Score) - Ganglionic level infarction (caudate, lentiform nuclei, internal capsule, insula, M1-M3 cortex): 6 - Supraganglionic infarction (M4-M6 cortex): 3 Total score (0-10 with 10 being normal): 9 IMPRESSION: 1. Chronic changes as described. Cannot exclude early infarct in the LEFT internal capsule/centrum semiovale. 2. ASPECTS is 9 Text message of the above findings sent to the on-call neurologist with instructions to call back at 2:05 p.m. Electronically Signed   By: Staci Righter M.D.   On: 07/26/2016 14:07    EKG: (Independently reviewed) sinus rhythm with first-degree AV block with occasional sinus arrhythmia versus short bursts of rate controlled paroxysmal atrial fibrillation with very short sinus pols-there are no dropped QRSs and otherwise no evidence of heart block, ventricular rate 70 bpm, QTC 478 ms, no ischemic changes  Assessment/Plan Principal Problem:   Visual changes/ History of stroke -Patient presents with visual change as well as mental status change and transient worsening of baseline aphasia and right-sided weakness concerning for possible TIA versus stroke -Uncertain role reported hypotension played in patient's presentation symptomatology -Appreciate neurology evaluation -Given known eliquis not TPA candidate and given mild symptomatology and severe vascular disease possible left PCA stenosis not appropriate for intervention per  neurology -MRI could not exclude early lacunar infarcts on the right-for to neurology's interpretation of MRI films -Echo -Carotid duplex -Hemoglobin A1c and lipid panel -Regular neuro checks -Continue preadmission aspirin 325 mg and eliquis -PT/OT/SLP -Unfortunately due to prior history of dysphagia RN unable to complete bedside swallow evaluation per protocol recommendations -Not on statin prior to admission  Active Problems:   ESRD on dialysis  -Completed half of today's dialysis -Nephrology aware of admission    Type II diabetes mellitus with neurological manifestations  -Follow-up on A1c -Follow CBGs and provide SSI -Continue low-dose Levemir    Essential hypertension -Apparent episodic hypotension during dialysis now resolved -Holding carvedilol (see below)    History of pulmonary embolism -Continue eliquis    Paroxysmal atrial fibrillation  -Continue eliquis -Patient currently maintaining first degree AV block with sinus etiology which was documented on 12-lead  as well as bedside telemetry; also noted with episodes of very brief sinus pauses versus sinus arrhythmia -Hold carvedilol for now -CHADVASc = 7 -If sinus pauses increase in frequency or become more sustained despite holding carvedilol consider cardiology evaluation    Patient is Jehovah's Witness -No blood products    ?? Seizure disorder  -Noted on preadmission problem list the patient does not take AEDs -Follow for possible seizure activity while here-none reported by patient or by HD staff during episode today   PVD/post left AKA (Feb 2017) -Primarily wheelchair-bound secondary to ill fitting prosthesis      DVT prophylaxis: Eliquis Code Status: DO NOT RESUSCITATE  Family Communication: The family at bedside at time of evaluation although water was previously spoken to by the neurologist Disposition Plan: Despite discharge back preadmission however, once medically stable Consults called:  Neurology/[place]; Nephrology/[place]     Deborah Hendricks L. ANP-BC [facility] Pager [phone]   If 7PM-7AM, please contact night-coverage [email or web address] Password [facility]  07/26/2016, 4:45 PM

## 2016-07-26 NOTE — Consult Note (Signed)
Neurology Consultation Reason for Consult: Hemianopia Referring Physician: Deno Etienne  CC: Hemianopia  History is obtained from: Patient  HPI: Tiernan Odonald is a 70 y.o. female with a history of CHF, end-stage renal disease who was at dialysis when she had sudden onset of difficulty speaking. She had a low blood pressure during that episode, but even after her blood pressure came back up she had persistent difficulty speaking and  Complained of visual changes. On examination here, she had right hemianopia and mild latency of speech. Due to being on Eliquis she was non-IV TPA candidate. She was taken for MRA which did show severe stenosis of the left PCA,? Occlusion.  I discussed with the daughter, I do not think that either intervention would be in her favor given her mild symptoms and severe vascular disease. The daughter expressed understanding  LKW: 1 PM tpa given?: no, on Eliquis    ROS: A 14 point ROS was performed and is negative except as noted in the HPI.  Past Medical History:  Diagnosis Date  . Anemia   . Anxiety   . CHF (congestive heart failure) ([place])   . Constipation   . Critical lower limb ischemia   . Depression   . Diabetes mellitus without complication ([place])   . Hypertension   . Kidney disease    ESRD  . Memory loss    since stroke 04/2015  . Peripheral vascular disease ([place])   . Seizures ([place]) 04/2015   since   . Stroke ([place]) 04/2015   unable to stand .  can have diffuuclty with speech at times     Family History  Problem Relation Age of Onset  . Asthma Mother   . Cancer Sister      Social History:  reports that she quit smoking about 2 years ago. She has never used smokeless tobacco. She reports that she does not drink alcohol or use drugs.   Exam: Current vital signs: BP 147/85   Pulse 71   Resp 13   Ht 5\' 6"  (1.676 m)   Wt 65.8 kg (145 lb)   SpO2 100%   BMI 23.40 kg/m  Vital signs in last 24 hours: Pulse Rate:  [65-75] 71 (09/14  1630) Resp:  [13-18] 13 (09/14 1630) BP: (147-182)/(74-85) 147/85 (09/14 1630) SpO2:  [98 %-100 %] 100 % (09/14 1630) Weight:  [65.8 kg (145 lb)] 65.8 kg (145 lb) (09/14 1458)   Physical Exam  Constitutional: Appears well-developed and well-nourished.  Psych: Affect appropriate to situation Eyes: No scleral injection HENT: No OP obstrucion Head: Normocephalic.  Cardiovascular: Normal rate and regular rhythm.  Respiratory: Effort normal and breath sounds normal to anterior ascultation GI: Soft.  No distension. There is no tenderness.  Skin: WDI  Neuro: Mental Status: Patient is awake, alert, oriented to person, place, month, year, and situation. Patient is able to give a clear and coherent history. No signs of neglect. She has mild latency of speech Cranial Nerves: II: She has a right hemianopia Pupils are equal, round, and reactive to light.   III,IV, VI: EOMI without ptosis or diploplia.  V: Facial sensation is decreased on the right VII: Facial movement is symmetric.  VIII: hearing is intact to voice X: Uvula elevates symmetrically XI: Shoulder shrug is symmetric. XII: tongue is midline without atrophy or fasciculations.  Motor: Tone is normal. Bulk is normal. 5/5 strength was present in all four extremities.  Sensory: Sensation is decreased light touch on the right Cerebellar: No clear  ataxia      I have reviewed labs in epic and the results pertinent to this consultation are: Creatinine  I have reviewed the images obtained: CT head-negative, MRA- poor flow in the PCA on the left  Impression: 70 year old female with new right hemianopia and difficult he speaking. I suspect that she has had some cerebral ischemia. Her MRI was done in the hyperacute phase, and I think repeating this may actually give [place] a better picture. There is some suggestion even there that she has had some infarct.  Recommendations: 1. HgbA1c, fasting lipid panel 2. MRI of the brain without  contrast, will repeat tonight.  3. Frequent neuro checks 4. Echocardiogram 5. Carotid dopplers 6. Prophylactic therapy-Antiplatelet med: Aspirin - dose 325mg  PO or 300mg  PR 7. Risk factor modification 8. Telemetry monitoring 9. PT consult, OT consult, Speech consult  Roland Rack, MD [facility] [phone]  If 7pm- 7am, please page neurology on call as listed in [place].

## 2016-07-26 NOTE — ED Provider Notes (Signed)
[facility] DEPT Provider Note   CSN: [id number] Arrival date & time: 07/26/16  1345   An emergency department physician performed an initial assessment on this suspected stroke patient at 1346 (Fernanda Twaddell).  History   Chief Complaint Chief Complaint  Patient presents with  . Code Stroke    HPI Deborah Hendricks is a 70 y.o. female.  70 yo F with a chief complaints of visual field deficit. Per family this is new. Patient has some blurriness to her right-sided vision. Having some slurred speech but that is at her baseline. Denies any areas of weakness. Deny recent head injury. Onset of this was just prior to arrival.   The history is provided by the patient.  Cerebrovascular Accident  This is a recurrent problem. The current episode started 3 to 5 hours ago. The problem occurs constantly. The problem has not changed since onset.Pertinent negatives include no chest pain, no headaches and no shortness of breath. Nothing aggravates the symptoms. Nothing relieves the symptoms. She has tried nothing for the symptoms. The treatment provided no relief.    Past Medical History:  Diagnosis Date  . Anemia   . Anxiety   . CHF (congestive heart failure) ([place])   . Constipation   . Critical lower limb ischemia   . Depression   . Diabetes mellitus without complication ([place])   . Hypertension   . Kidney disease    ESRD  . Memory loss    since stroke 04/2015  . Peripheral vascular disease ([place])   . Seizures ([place]) 04/2015   since   . Stroke ([place]) 04/2015   unable to stand .  can have diffuuclty with speech at times    Patient Active Problem List   Diagnosis Date Noted  . Asymmetric SNHL (sensorineural hearing loss) 06/12/2016  . Essential hypertension 06/08/2016  . History of pulmonary embolism 06/08/2016  . History of stroke 06/08/2016  . History of borderline personality disorder 04/19/2016  . History of medication noncompliance 04/19/2016  . Paroxysmal atrial fibrillation ([place])  04/19/2016  . Patient is Jehovah's Witness 04/19/2016  . Seizure disorder ([place]) 04/19/2016  . Renovascular hypertension 04/13/2016  . ESRD on dialysis ([place]) 04/11/2016  . Generalized anxiety disorder 04/11/2016  . Major depression, recurrent, chronic ([place]) 04/11/2016  . S/P AKA (above knee amputation) unilateral ([place]) 04/11/2016  . PAD (peripheral artery disease) ([place]) 12/15/2015  . Critical lower limb ischemia 11/16/2015  . Type 2 diabetes mellitus with left diabetic foot ulcer ([place]) 10/21/2015  . Type II diabetes mellitus with neurological manifestations ([place]) 10/21/2015    Past Surgical History:  Procedure Laterality Date  . ABDOMINAL HYSTERECTOMY    . AMPUTATION Left 12/15/2015   Procedure: LEFT ABOVE KNEE AMPUTATION;  Surgeon: Serafina Mitchell, MD;  Location: [facility];  Service: Vascular;  Laterality: Left;  . BASCILIC VEIN TRANSPOSITION Right 11/25/2015   Procedure: Right Arm FIRST STAGE BASILIC VEIN TRANSPOSITION;  Surgeon: Serafina Mitchell, MD;  Location: [facility];  Service: Vascular;  Laterality: Right;  . BASCILIC VEIN TRANSPOSITION Right 01/19/2016   Procedure: RIGHT SECOND STAGE BASILIC VEIN TRANSPOSITION;  Surgeon: Serafina Mitchell, MD;  Location: [facility];  Service: Vascular;  Laterality: Right;  . PERIPHERAL VASCULAR CATHETERIZATION Bilateral 11/28/2015   Procedure: Lower Extremity Angiography;  Surgeon: Lorretta Harp, MD;  Location: [facility];  Service: Cardiovascular;  Laterality: Bilateral;  . PERIPHERAL VASCULAR CATHETERIZATION N/A 11/28/2015   Procedure: Abdominal Aortogram;  Surgeon: Lorretta Harp, MD;  Location: [facility];  Service: Cardiovascular;  Laterality: N/A;  . TOE AMPUTATION     R foot  . TONSILLECTOMY      OB History    No data available       Home Medications    Prior to Admission medications   Medication Sig Start Date End Date Taking? Authorizing Provider  apixaban (ELIQUIS) 5 MG TABS tablet Take 5 mg by mouth daily.   Yes Historical  Provider, MD  aspirin EC 325 MG tablet Take 325 mg by mouth daily.   Yes Historical Provider, MD  calcium acetate (PHOSLO) 667 MG capsule Take 667 mg by mouth 3 (three) times daily with meals.   Yes Historical Provider, MD  carvedilol (COREG) 12.5 MG tablet Take 12.5 mg by mouth 2 (two) times daily.    Yes Historical Provider, MD  gabapentin (NEURONTIN) 100 MG capsule Take 100 mg by mouth 3 (three) times daily.   Yes Historical Provider, MD  insulin aspart (NOVOLOG) 100 UNIT/ML injection Inject 2-10 Units into the skin 3 (three) times daily before meals. 151-200=2 units, 201-250=4 units, 251-300=6 units, 301-350=8 units, 351-400=10 units, >400=10 units and Call MD   Yes Historical Provider, MD  insulin detemir (LEVEMIR) 100 UNIT/ML injection Inject 5 Units into the skin daily.    Yes Historical Provider, MD  multivitamin (RENA-VIT) TABS tablet Take 1 tablet by mouth daily.   Yes Historical Provider, MD  Nutritional Supplements (FEEDING SUPPLEMENT, NEPRO CARB STEADY,) LIQD Take 237 mLs by mouth 2 (two) times daily between meals.   Yes Historical Provider, MD  ondansetron (ZOFRAN) 4 MG tablet Take 4 mg by mouth every 8 (eight) hours as needed for nausea or vomiting.   Yes Historical Provider, MD  doxycycline (VIBRA-TABS) 100 MG tablet Take 100 mg by mouth 2 (two) times daily. 07/10/16   Historical Provider, MD    Family History Family History  Problem Relation Age of Onset  . Asthma Mother   . Cancer Sister     Social History Social History  Substance Use Topics  . Smoking status: Former Smoker    Quit date: 11/17/2013  . Smokeless tobacco: Never Used  . Alcohol use No     Allergies   Penicillins; Amoxicillin; Latex; and Sulfa antibiotics   Review of Systems Review of Systems  Constitutional: Negative for chills and fever.  HENT: Negative for congestion and rhinorrhea.   Eyes: Positive for visual disturbance. Negative for redness.  Respiratory: Negative for shortness of breath and  wheezing.   Cardiovascular: Negative for chest pain and palpitations.  Gastrointestinal: Negative for nausea and vomiting.  Genitourinary: Negative for dysuria and urgency.  Musculoskeletal: Negative for arthralgias and myalgias.  Skin: Negative for pallor and wound.  Neurological: Negative for dizziness and headaches.     Physical Exam Updated Vital Signs BP 182/74 (BP Location: Right Arm)   Pulse 65   Resp 18   Ht 5\' 6"  (1.676 m)   Wt 145 lb (65.8 kg)   SpO2 100%   BMI 23.40 kg/m   Physical Exam  Constitutional: She is oriented to person, place, and time. She appears well-developed and well-nourished. No distress.  HENT:  Head: Normocephalic and atraumatic.  Eyes: EOM are normal. Pupils are equal, round, and reactive to light.  Neck: Normal range of motion. Neck supple.  Cardiovascular: Normal rate and regular rhythm.  Exam reveals no gallop and no friction rub.   No murmur heard. Pulmonary/Chest: Effort normal. She has no wheezes. She has no rales.  Abdominal: Soft.  She exhibits no distension. There is no tenderness.  Musculoskeletal: She exhibits no edema or tenderness.  Neurological: She is alert and oriented to person, place, and time.  Visual field deficit, slurred speech   Skin: Skin is warm and dry. She is not diaphoretic.  Psychiatric: She has a normal mood and affect. Her behavior is normal.  Nursing note and vitals reviewed.    ED Treatments / Results  Labs (all labs ordered are listed, but only abnormal results are displayed) Labs Reviewed  PROTIME-INR - Abnormal; Notable for the following:       Result Value   Prothrombin Time 15.5 (*)    All other components within normal limits  APTT - Abnormal; Notable for the following:    aPTT >200 (*)    All other components within normal limits  CBC - Abnormal; Notable for the following:    RBC 3.67 (*)    Hemoglobin 11.1 (*)    HCT 35.4 (*)    RDW 16.3 (*)    All other components within normal limits    COMPREHENSIVE METABOLIC PANEL - Abnormal; Notable for the following:    Glucose, Bld 156 (*)    BUN 42 (*)    Creatinine, Ser 3.28 (*)    Calcium 8.5 (*)    Albumin 3.4 (*)    Alkaline Phosphatase 230 (*)    GFR calc non Af Amer 13 (*)    GFR calc Af Amer 15 (*)    All other components within normal limits  I-STAT CHEM 8, ED - Abnormal; Notable for the following:    Chloride 100 (*)    BUN 42 (*)    Creatinine, Ser 3.30 (*)    Glucose, Bld 153 (*)    Calcium, Ion 1.01 (*)    All other components within normal limits  I-STAT TROPOININ, ED - Abnormal; Notable for the following:    Troponin i, poc 0.15 (*)    All other components within normal limits  CBG MONITORING, ED - Abnormal; Notable for the following:    Glucose-Capillary 166 (*)    All other components within normal limits  ETHANOL  DIFFERENTIAL  URINE RAPID DRUG SCREEN, HOSP PERFORMED  URINALYSIS, ROUTINE W REFLEX MICROSCOPIC (NOT AT [facility])    EKG  EKG Interpretation  Date/Time:  Thursday July 26 2016 14:53:55 EDT Ventricular Rate:  79 PR Interval:    QRS Duration: 85 QT Interval:  414 QTC Calculation: 475 R Axis:     Text Interpretation:  Sinus rhythm Prolonged PR interval Low voltage, precordial leads Anteroseptal infarct, old No significant change since last tracing Confirmed by Shuayb Schepers MD, DANIEL [phone]) on 07/26/2016 4:16:37 PM       Radiology Mr Jodene Nam Head Wo Contrast  Addendum Date: 07/26/2016   ADDENDUM REPORT: 07/26/2016 15:13 ADDENDUM: Study discussed by telephone with Dr. Roland Rack on 07/26/2016 at 1507 hours. Electronically Signed   By: Genevie Ann M.D.   On: 07/26/2016 15:13   Result Date: 07/26/2016 CLINICAL DATA:  70 year old female code stroke. Acute onset slurred speech. Initial encounter. Limited MRI protocol requested by Neurology to include only axial and coronal diffusion. EXAM: MRI HEAD WITHOUT CONTRAST LIMITED MRA HEAD WITHOUT CONTRAST TECHNIQUE: Multiplanar, multiecho pulse  sequences of the brain and surrounding structures were obtained without intravenous contrast. Angiographic images of the head were obtained using MRA technique without contrast. COMPARISON:  Head CT without contrast 1347 hours today. FINDINGS: MRI HEAD FINDINGS Heterogeneity along the posterior limbs of both internal capsules,  corresponding to heterogeneous hypodensity on the earlier CT. Facilitated diffusion in these areas indicating multiple chronic lacunar infarcts. Probable superimposed chronic microhemorrhage accounting for some of the trace diffusion heterogeneity. Overall it is difficult to exclude tiny areas of restricted diffusion along the right corona radiata (series 3, image 25) and perhaps also the left caudate (as seen on series 4, image 17). No restricted diffusion elsewhere. No intracranial mass effect. No ventriculomegaly. MRA HEAD FINDINGS Antegrade flow in the posterior circulation with codominant distal vertebral arteries. Mild distal vertebral irregularity without stenosis. Right PICA is patent. Patent vertebrobasilar junction and bilateral AICA origins. No basilar stenosis. Normal SCA and PCA origins. Mild motion artifact at the level of the posterior communicating arteries, the left appears to be present. The right could be diminutive or absent. Motion artifact also appears to exaggerate signal loss in the distal left PCA which appears better preserved on source images. No proximal PCA occlusion. Antegrade flow in both ICA siphons. Moderate cavernous and proximal supraclinoid siphon irregularity. There probably is an atherosclerotic pseudo lesion of the left cavernous carotid directed inferiorly and laterally on series 5, image 83 (and series 503, image 8). Both carotid termini are patent. MCA and ACA origins are patent. Motion artifact affecting the M1 and A1 vessels which appear to remain normal. Bilateral MCA bifurcations are patent. Motion artifact continues into the second order vessels.  No M2 occlusion identified. Visualized ACA branches appear within normal limits. IMPRESSION: 1. Negative for emergent large vessel occlusion. 2. Intermittently motion degraded intracranial MRA. Moderate to severe stenosis of the distal left PCA which may be exaggerated due to motion artifact. Extensive bilateral ICA siphon atherosclerosis with up to moderate siphon stenosis. Probable left cavernous segment atherosclerotic pseudo lesion (vs. a small 2 mm cavernous segment aneurysm). 3. Diffusion weighted imaging is heterogeneous in the posterior deep gray matter suggesting the presence of chronic small vessel ischemia with microhemorrhage. 4. Difficult to exclude small acute lacunar infarcts of the right corona radiata, and left caudate. Electronically Signed: By: Genevie Ann M.D. On: 07/26/2016 15:05   Mr Brain Ltd W/o Cm  Addendum Date: 07/26/2016   ADDENDUM REPORT: 07/26/2016 15:13 ADDENDUM: Study discussed by telephone with Dr. Roland Rack on 07/26/2016 at 1507 hours. Electronically Signed   By: Genevie Ann M.D.   On: 07/26/2016 15:13   Result Date: 07/26/2016 CLINICAL DATA:  70 year old female code stroke. Acute onset slurred speech. Initial encounter. Limited MRI protocol requested by Neurology to include only axial and coronal diffusion. EXAM: MRI HEAD WITHOUT CONTRAST LIMITED MRA HEAD WITHOUT CONTRAST TECHNIQUE: Multiplanar, multiecho pulse sequences of the brain and surrounding structures were obtained without intravenous contrast. Angiographic images of the head were obtained using MRA technique without contrast. COMPARISON:  Head CT without contrast 1347 hours today. FINDINGS: MRI HEAD FINDINGS Heterogeneity along the posterior limbs of both internal capsules, corresponding to heterogeneous hypodensity on the earlier CT. Facilitated diffusion in these areas indicating multiple chronic lacunar infarcts. Probable superimposed chronic microhemorrhage accounting for some of the trace diffusion  heterogeneity. Overall it is difficult to exclude tiny areas of restricted diffusion along the right corona radiata (series 3, image 25) and perhaps also the left caudate (as seen on series 4, image 17). No restricted diffusion elsewhere. No intracranial mass effect. No ventriculomegaly. MRA HEAD FINDINGS Antegrade flow in the posterior circulation with codominant distal vertebral arteries. Mild distal vertebral irregularity without stenosis. Right PICA is patent. Patent vertebrobasilar junction and bilateral AICA origins. No basilar stenosis. Normal SCA and  PCA origins. Mild motion artifact at the level of the posterior communicating arteries, the left appears to be present. The right could be diminutive or absent. Motion artifact also appears to exaggerate signal loss in the distal left PCA which appears better preserved on source images. No proximal PCA occlusion. Antegrade flow in both ICA siphons. Moderate cavernous and proximal supraclinoid siphon irregularity. There probably is an atherosclerotic pseudo lesion of the left cavernous carotid directed inferiorly and laterally on series 5, image 83 (and series 503, image 8). Both carotid termini are patent. MCA and ACA origins are patent. Motion artifact affecting the M1 and A1 vessels which appear to remain normal. Bilateral MCA bifurcations are patent. Motion artifact continues into the second order vessels. No M2 occlusion identified. Visualized ACA branches appear within normal limits. IMPRESSION: 1. Negative for emergent large vessel occlusion. 2. Intermittently motion degraded intracranial MRA. Moderate to severe stenosis of the distal left PCA which may be exaggerated due to motion artifact. Extensive bilateral ICA siphon atherosclerosis with up to moderate siphon stenosis. Probable left cavernous segment atherosclerotic pseudo lesion (vs. a small 2 mm cavernous segment aneurysm). 3. Diffusion weighted imaging is heterogeneous in the posterior deep gray  matter suggesting the presence of chronic small vessel ischemia with microhemorrhage. 4. Difficult to exclude small acute lacunar infarcts of the right corona radiata, and left caudate. Electronically Signed: By: Genevie Ann M.D. On: 07/26/2016 15:05   Ct Head Code Stroke Wo Contrast`  Result Date: 07/26/2016 CLINICAL DATA:  Code stroke. Difficulty speaking, change in mental status. EXAM: CT HEAD WITHOUT CONTRAST TECHNIQUE: Contiguous axial images were obtained from the base of the skull through the vertex without intravenous contrast. COMPARISON:  None. FINDINGS: No evidence for acute hemorrhage, mass lesion, hydrocephalus, or extra-axial fluid. Mild atrophy. Moderate hypoattenuation of white matter suggesting chronic microvascular ischemic change. BILATERAL lacunar infarcts affect the posterior limb internal capsule on the RIGHT and thalamus on the LEFT. Asymmetric hypodensity of the LEFT internal capsule/ centrum semiovale (coronal image 36 series 203, axial image 14-15 series 201) could represent acute infarction. No visible cortical hypodensities. Advanced vascular calcification in the carotid siphons and supraclinoid internal carotid artery segments bilaterally. No hyperdense ICA terminus or M1 MCA. The calvarium is intact.  No sinus or mastoid disease. ASPECTS [facility] Stroke Program Early CT Score) - Ganglionic level infarction (caudate, lentiform nuclei, internal capsule, insula, M1-M3 cortex): 6 - Supraganglionic infarction (M4-M6 cortex): 3 Total score (0-10 with 10 being normal): 9 IMPRESSION: 1. Chronic changes as described. Cannot exclude early infarct in the LEFT internal capsule/centrum semiovale. 2. ASPECTS is 9 Text message of the above findings sent to the on-call neurologist with instructions to call back at 2:05 p.m. Electronically Signed   By: Staci Righter M.D.   On: 07/26/2016 14:07    Procedures Procedures (including critical care time)  Medications Ordered in ED Medications - No data  to display   Initial Impression / Assessment and Plan / ED Course  I have reviewed the triage vital signs and the nursing notes.  Pertinent labs & imaging results that were available during my care of the patient were reviewed by me and considered in my medical decision making (see chart for details).  Clinical Course    70 yo F with new visual loss. Neuro concerned for stroke.  MR with? Findings of new stroke.  On eliquis so will hold off tpa.  Not a candidate for catheter directed therapy.  Medical admit.   The patients results  and plan were reviewed and discussed.   Any x-rays performed were independently reviewed by myself.   Differential diagnosis were considered with the presenting HPI.  Medications  insulin aspart (novoLOG) injection 0-9 Units (not administered)    Vitals:   07/26/16 1557 07/26/16 1600 07/26/16 1630 07/26/16 1725  BP: 182/74 153/82 147/85 168/82  Pulse: 65 73 71 72  Resp: 18 16 13 12   SpO2: 100% 99% 100% 98%  Weight:      Height:        Final diagnoses:  Stroke ([place])  Stroke-like symptoms  Stroke (cerebrum) (HCC)    Admission/ observation were discussed with the admitting physician, patient and/or family and they are comfortable with the plan.    Final Clinical Impressions(s) / ED Diagnoses   Final diagnoses:  Stroke [facility])  Stroke-like symptoms  Stroke (cerebrum) [facility])    New Prescriptions New Prescriptions   No medications on file     Deno Etienne, DO 07/26/16 1740

## 2016-07-26 NOTE — ED Notes (Signed)
Attempted report x1. 

## 2016-07-26 NOTE — Code Documentation (Signed)
70yo female arriving to [facility] via [place] at 45.  Patient from dialysis where she had sudden change in mental status, facial droop and slurred speech at 1300.  EMS reports patient also had a decrease in SBP 70s which subsequently increased to the 180s during this episode.  Patient with some improvement per EMS.  Stroke team at the bedside on patient arrival.  Labs drawn and patient cleared for CT by Dr. Tyrone Nine.  Patient to CT with team.  CT completed.  Patient to STAT MRI per MD.  MRI DWI and MRA completed.  NIHSS 4, see documentation for details and code stroke times.  Patient with right hemianopia, mild facial asymmetry and slow speech.  Patient on Eliquis for h/o PE and atrial fibrillation.  Stroke RN spoke with patient's daughter and CNA over the phone.  From discussion patient has had some confusion with taking the correct day's medication in her pill box, however, it appears that patient took her Eliquis on Tuesday and also took her daily medications on Wednesday which should have included her Eliquis.  It is unclear if she took her medications today.  Patient is contraindicated for tPA d/t taking Eliquis dose within 48 hours.  No acute stroke intervention at this time.  Handoff with ED RN Jerene Pitch.

## 2016-07-26 NOTE — ED Notes (Signed)
Primary contact  Hinton Dyer (daughter) [phone]  Cam (grandson) [phone]  Lonn Georgia (granddaughter) [phone]

## 2016-07-26 NOTE — ED Notes (Signed)
Contacted SLP about bedside swallow exam.

## 2016-07-26 NOTE — ED Notes (Signed)
Patient states she makes a little urine and doesn't pee on her dialysis days. She had dialysis this afternoon. Will wait to collect urine specimen.

## 2016-07-26 NOTE — Consult Note (Signed)
[facility] Renal Consultation Note    Indication for Consultation:  Management of ESRD/hemodialysis; anemia, hypertension/volume and secondary hyperparathyroidism PCP: Cardiology: Dr. Adora Fridge  HPI: Deborah Hendricks is a 70 y.o. AA [name] on hemodialysis T,Th,S at [facility]. Past medical history of HTN, DMT2, PVD, Critical limb ischemia, CVA 2016, seizures, CHF, H/O AFib/PE on eliquis. L AKA  12/2015. Patient is a Jehovah Witness, does not receive blood products. Patient is a former smoker.  Patient was having HD today on schedule. 2 hours into treatment, she was noted to have altered mental status, slurred speech, SBP fell to 70s. Patient states she "I felt awful, I knew something was wrong but I couldn't do anything about it!" She was transferred to [facility] ED to be evaluated for acute CVA. CT of head was negative, MRA showed moderate to severe stenosis distal left PCA . Labs were done post HD Na 138 K+ 3.9 Scr 42 BUN 42 BS 153 WBC 6.2 HGB 11.1 PLT 166 PT/INR normal, APTT > 200.   Currently, she is back to baseline, alert, oriented, talking on phone to legal representative from [employer]. She currently denies HA, blurred vision, fever, chills, N,V,D, chest pain, SOB. Says she has been fine, was recently treated with vancomycin for cellulitis (S. Epidermidis) R shin from cat scratch. SR on monitor, noted to be having 3-3.5 second sinus pauses on HR. Has been having these since 1500. ED physician has been notified, cardiology has been consulted. Patient will be admitted for observation for TIA vs CVA. We will manage her hemodialysis.  Past Medical History:  Diagnosis Date  . Anemia   . Anxiety   . CHF (congestive heart failure) ([place])   . Constipation   . Critical lower limb ischemia   . Depression   . Diabetes mellitus without complication ([place])   . Hypertension   . Kidney disease    ESRD  . Memory loss    since stroke 04/2015  . Peripheral vascular  disease ([place])   . Seizures ([place]) 04/2015   since   . Stroke ([place]) 04/2015   unable to stand .  can have diffuuclty with speech at times   Past Surgical History:  Procedure Laterality Date  . ABDOMINAL HYSTERECTOMY    . AMPUTATION Left 12/15/2015   Procedure: LEFT ABOVE KNEE AMPUTATION;  Surgeon: Serafina Mitchell, MD;  Location: [place];  Service: Vascular;  Laterality: Left;  . BASCILIC VEIN TRANSPOSITION Right 11/25/2015   Procedure: Right Arm FIRST STAGE BASILIC VEIN TRANSPOSITION;  Surgeon: Serafina Mitchell, MD;  Location: [place];  Service: Vascular;  Laterality: Right;  . BASCILIC VEIN TRANSPOSITION Right 01/19/2016   Procedure: RIGHT SECOND STAGE BASILIC VEIN TRANSPOSITION;  Surgeon: Serafina Mitchell, MD;  Location: [place];  Service: Vascular;  Laterality: Right;  . PERIPHERAL VASCULAR CATHETERIZATION Bilateral 11/28/2015   Procedure: Lower Extremity Angiography;  Surgeon: Lorretta Harp, MD;  Location: [place] CV LAB;  Service: Cardiovascular;  Laterality: Bilateral;  . PERIPHERAL VASCULAR CATHETERIZATION N/A 11/28/2015   Procedure: Abdominal Aortogram;  Surgeon: Lorretta Harp, MD;  Location: [place] CV LAB;  Service: Cardiovascular;  Laterality: N/A;  . TOE AMPUTATION     R foot  . TONSILLECTOMY     Family History  Problem Relation Age of Onset  . Asthma Mother   . Cancer Sister    Social History:  reports that she quit smoking about 2 years ago. She has never used smokeless tobacco.  She reports that she does not drink alcohol or use drugs. Allergies  Allergen Reactions  . Penicillins Hives, Itching and Rash    Has patient had a PCN reaction causing immediate rash, facial/tongue/throat swelling, SOB or lightheadedness with hypotension: Yes Has patient had a PCN reaction causing severe rash involving mucus membranes or skin necrosis:  Has patient had a PCN reaction that required hospitalization  Has patient had a PCN reaction occurring within the last 10 years:  If all of the  above answers are "NO", then may proceed with Cephalosporin use.  [name] Amoxicillin Other (See Comments)    Unspecified Per MAR  . Latex Other (See Comments)    Unspecified Per MAR  . Sulfa Antibiotics Other (See Comments)    Unspecified Per MAR   Prior to Admission medications   Medication Sig Start Date End Date Taking? Authorizing Provider  apixaban (ELIQUIS) 5 MG TABS tablet Take 5 mg by mouth daily.   Yes Historical Provider, MD  aspirin EC 325 MG tablet Take 325 mg by mouth daily.   Yes Historical Provider, MD  calcium acetate (PHOSLO) 667 MG capsule Take 667 mg by mouth 3 (three) times daily with meals.   Yes Historical Provider, MD  carvedilol (COREG) 12.5 MG tablet Take 12.5 mg by mouth 2 (two) times daily.    Yes Historical Provider, MD  gabapentin (NEURONTIN) 100 MG capsule Take 100 mg by mouth 3 (three) times daily.   Yes Historical Provider, MD  insulin aspart (NOVOLOG) 100 UNIT/ML injection Inject 2-10 Units into the skin 3 (three) times daily before meals. 151-200=2 units, 201-250=4 units, 251-300=6 units, 301-350=8 units, 351-400=10 units, >400=10 units and Call MD   Yes Historical Provider, MD  insulin detemir (LEVEMIR) 100 UNIT/ML injection Inject 5 Units into the skin daily.    Yes Historical Provider, MD  multivitamin (RENA-VIT) TABS tablet Take 1 tablet by mouth daily.   Yes Historical Provider, MD  Nutritional Supplements (FEEDING SUPPLEMENT, NEPRO CARB STEADY,) LIQD Take 237 mLs by mouth 2 (two) times daily between meals.   Yes Historical Provider, MD  ondansetron (ZOFRAN) 4 MG tablet Take 4 mg by mouth every 8 (eight) hours as needed for nausea or vomiting.   Yes Historical Provider, MD  doxycycline (VIBRA-TABS) 100 MG tablet Take 100 mg by mouth 2 (two) times daily. 07/10/16   Historical Provider, MD   No current facility-administered medications for this encounter.    Current Outpatient Prescriptions  Medication Sig Dispense Refill  . apixaban (ELIQUIS) 5 MG TABS tablet  Take 5 mg by mouth daily.    [name] aspirin EC 325 MG tablet Take 325 mg by mouth daily.    . calcium acetate (PHOSLO) 667 MG capsule Take 667 mg by mouth 3 (three) times daily with meals.    . carvedilol (COREG) 12.5 MG tablet Take 12.5 mg by mouth 2 (two) times daily.     [name] gabapentin (NEURONTIN) 100 MG capsule Take 100 mg by mouth 3 (three) times daily.    . insulin aspart (NOVOLOG) 100 UNIT/ML injection Inject 2-10 Units into the skin 3 (three) times daily before meals. 151-200=2 units, 201-250=4 units, 251-300=6 units, 301-350=8 units, 351-400=10 units, >400=10 units and Call MD    . insulin detemir (LEVEMIR) 100 UNIT/ML injection Inject 5 Units into the skin daily.     . multivitamin (RENA-VIT) TABS tablet Take 1 tablet by mouth daily.    . Nutritional Supplements (FEEDING SUPPLEMENT, NEPRO CARB STEADY,) LIQD Take 237 mLs by mouth  2 (two) times daily between meals.    . ondansetron (ZOFRAN) 4 MG tablet Take 4 mg by mouth every 8 (eight) hours as needed for nausea or vomiting.    [name] doxycycline (VIBRA-TABS) 100 MG tablet Take 100 mg by mouth 2 (two) times daily.  0   Labs: Basic Metabolic Panel:  Recent Labs Lab 07/26/16 1347 07/26/16 1356  NA 138 139  K 3.9 3.8  CL 103 100*  CO2 26  --   GLUCOSE 156* 153*  BUN 42* 42*  CREATININE 3.28* 3.30*  CALCIUM 8.5*  --    Liver Function Tests:  Recent Labs Lab 07/26/16 1347  AST 30  ALT 37  ALKPHOS 230*  BILITOT 0.5  PROT 6.7  ALBUMIN 3.4*   No results for input(s): LIPASE, AMYLASE in the last 168 hours. No results for input(s): AMMONIA in the last 168 hours. CBC:  Recent Labs Lab 07/26/16 1347 07/26/16 1356  WBC 6.2  --   NEUTROABS 3.7  --   HGB 11.1* 12.2  HCT 35.4* 36.0  MCV 96.5  --   PLT 166  --    Cardiac Enzymes: No results for input(s): CKTOTAL, CKMB, CKMBINDEX, TROPONINI in the last 168 hours. CBG:  Recent Labs Lab 07/26/16 1347  GLUCAP 166*   Iron Studies: No results for input(s): IRON, TIBC,  TRANSFERRIN, FERRITIN in the last 72 hours. Studies/Results: Mr Virgel Paling [id number] Contrast  Addendum Date: 07/26/2016   ADDENDUM REPORT: 07/26/2016 15:13 ADDENDUM: Study discussed by telephone with Dr. Roland Rack on 07/26/2016 at 1507 hours. Electronically Signed   By: Genevie Ann M.D.   On: 07/26/2016 15:13   Result Date: 07/26/2016 CLINICAL DATA:  70 year old female code stroke. Acute onset slurred speech. Initial encounter. Limited MRI protocol requested by Neurology to include only axial and coronal diffusion. EXAM: MRI HEAD WITHOUT CONTRAST LIMITED MRA HEAD WITHOUT CONTRAST TECHNIQUE: Multiplanar, multiecho pulse sequences of the brain and surrounding structures were obtained without intravenous contrast. Angiographic images of the head were obtained using MRA technique without contrast. COMPARISON:  Head CT without contrast 1347 hours today. FINDINGS: MRI HEAD FINDINGS Heterogeneity along the posterior limbs of both internal capsules, corresponding to heterogeneous hypodensity on the earlier CT. Facilitated diffusion in these areas indicating multiple chronic lacunar infarcts. Probable superimposed chronic microhemorrhage accounting for some of the trace diffusion heterogeneity. Overall it is difficult to exclude tiny areas of restricted diffusion along the right corona radiata (series 3, image 25) and perhaps also the left caudate (as seen on series 4, image 17). No restricted diffusion elsewhere. No intracranial mass effect. No ventriculomegaly. MRA HEAD FINDINGS Antegrade flow in the posterior circulation with codominant distal vertebral arteries. Mild distal vertebral irregularity without stenosis. Right PICA is patent. Patent vertebrobasilar junction and bilateral AICA origins. No basilar stenosis. Normal SCA and PCA origins. Mild motion artifact at the level of the posterior communicating arteries, the left appears to be present. The right could be diminutive or absent. Motion artifact also appears  to exaggerate signal loss in the distal left PCA which appears better preserved on source images. No proximal PCA occlusion. Antegrade flow in both ICA siphons. Moderate cavernous and proximal supraclinoid siphon irregularity. There probably is an atherosclerotic pseudo lesion of the left cavernous carotid directed inferiorly and laterally on series 5, image 83 (and series 503, image 8). Both carotid termini are patent. MCA and ACA origins are patent. Motion artifact affecting the M1 and A1 vessels which appear to remain normal. Bilateral MCA bifurcations  are patent. Motion artifact continues into the second order vessels. No M2 occlusion identified. Visualized ACA branches appear within normal limits. IMPRESSION: 1. Negative for emergent large vessel occlusion. 2. Intermittently motion degraded intracranial MRA. Moderate to severe stenosis of the distal left PCA which may be exaggerated due to motion artifact. Extensive bilateral ICA siphon atherosclerosis with up to moderate siphon stenosis. Probable left cavernous segment atherosclerotic pseudo lesion (vs. a small 2 mm cavernous segment aneurysm). 3. Diffusion weighted imaging is heterogeneous in the posterior deep gray matter suggesting the presence of chronic small vessel ischemia with microhemorrhage. 4. Difficult to exclude small acute lacunar infarcts of the right corona radiata, and left caudate. Electronically Signed: By: Genevie Ann M.D. On: 07/26/2016 15:05   Mr Brain Ltd W/o Cm  Addendum Date: 07/26/2016   ADDENDUM REPORT: 07/26/2016 15:13 ADDENDUM: Study discussed by telephone with Dr. Roland Rack on 07/26/2016 at 1507 hours. Electronically Signed   By: Genevie Ann M.D.   On: 07/26/2016 15:13   Result Date: 07/26/2016 CLINICAL DATA:  70 year old female code stroke. Acute onset slurred speech. Initial encounter. Limited MRI protocol requested by Neurology to include only axial and coronal diffusion. EXAM: MRI HEAD WITHOUT CONTRAST LIMITED MRA HEAD  WITHOUT CONTRAST TECHNIQUE: Multiplanar, multiecho pulse sequences of the brain and surrounding structures were obtained without intravenous contrast. Angiographic images of the head were obtained using MRA technique without contrast. COMPARISON:  Head CT without contrast 1347 hours today. FINDINGS: MRI HEAD FINDINGS Heterogeneity along the posterior limbs of both internal capsules, corresponding to heterogeneous hypodensity on the earlier CT. Facilitated diffusion in these areas indicating multiple chronic lacunar infarcts. Probable superimposed chronic microhemorrhage accounting for some of the trace diffusion heterogeneity. Overall it is difficult to exclude tiny areas of restricted diffusion along the right corona radiata (series 3, image 25) and perhaps also the left caudate (as seen on series 4, image 17). No restricted diffusion elsewhere. No intracranial mass effect. No ventriculomegaly. MRA HEAD FINDINGS Antegrade flow in the posterior circulation with codominant distal vertebral arteries. Mild distal vertebral irregularity without stenosis. Right PICA is patent. Patent vertebrobasilar junction and bilateral AICA origins. No basilar stenosis. Normal SCA and PCA origins. Mild motion artifact at the level of the posterior communicating arteries, the left appears to be present. The right could be diminutive or absent. Motion artifact also appears to exaggerate signal loss in the distal left PCA which appears better preserved on source images. No proximal PCA occlusion. Antegrade flow in both ICA siphons. Moderate cavernous and proximal supraclinoid siphon irregularity. There probably is an atherosclerotic pseudo lesion of the left cavernous carotid directed inferiorly and laterally on series 5, image 83 (and series 503, image 8). Both carotid termini are patent. MCA and ACA origins are patent. Motion artifact affecting the M1 and A1 vessels which appear to remain normal. Bilateral MCA bifurcations are patent.  Motion artifact continues into the second order vessels. No M2 occlusion identified. Visualized ACA branches appear within normal limits. IMPRESSION: 1. Negative for emergent large vessel occlusion. 2. Intermittently motion degraded intracranial MRA. Moderate to severe stenosis of the distal left PCA which may be exaggerated due to motion artifact. Extensive bilateral ICA siphon atherosclerosis with up to moderate siphon stenosis. Probable left cavernous segment atherosclerotic pseudo lesion (vs. a small 2 mm cavernous segment aneurysm). 3. Diffusion weighted imaging is heterogeneous in the posterior deep gray matter suggesting the presence of chronic small vessel ischemia with microhemorrhage. 4. Difficult to exclude small acute lacunar infarcts of the  right corona radiata, and left caudate. Electronically Signed: By: Genevie Ann M.D. On: 07/26/2016 15:05   Ct Head Code Stroke Wo Contrast`  Result Date: 07/26/2016 CLINICAL DATA:  Code stroke. Difficulty speaking, change in mental status. EXAM: CT HEAD WITHOUT CONTRAST TECHNIQUE: Contiguous axial images were obtained from the base of the skull through the vertex without intravenous contrast. COMPARISON:  None. FINDINGS: No evidence for acute hemorrhage, mass lesion, hydrocephalus, or extra-axial fluid. Mild atrophy. Moderate hypoattenuation of white matter suggesting chronic microvascular ischemic change. BILATERAL lacunar infarcts affect the posterior limb internal capsule on the RIGHT and thalamus on the LEFT. Asymmetric hypodensity of the LEFT internal capsule/ centrum semiovale (coronal image 36 series 203, axial image 14-15 series 201) could represent acute infarction. No visible cortical hypodensities. Advanced vascular calcification in the carotid siphons and supraclinoid internal carotid artery segments bilaterally. No hyperdense ICA terminus or M1 MCA. The calvarium is intact.  No sinus or mastoid disease. ASPECTS [facility] Stroke Program Early CT Score) -  Ganglionic level infarction (caudate, lentiform nuclei, internal capsule, insula, M1-M3 cortex): 6 - Supraganglionic infarction (M4-M6 cortex): 3 Total score (0-10 with 10 being normal): 9 IMPRESSION: 1. Chronic changes as described. Cannot exclude early infarct in the LEFT internal capsule/centrum semiovale. 2. ASPECTS is 9 Text message of the above findings sent to the on-call neurologist with instructions to call back at 2:05 p.m. Electronically Signed   By: Staci Righter M.D.   On: 07/26/2016 14:07    ROS: As per HPI otherwise negative.  Physical Exam: Vitals:   07/26/16 1545 07/26/16 1557 07/26/16 1600 07/26/16 1630  BP: 182/74 182/74 153/82 147/85  Pulse: 67 65 73 71  Resp: 15 18 16 13   SpO2: 98% 100% 99% 100%  Weight:      Height:         General: Well developed, well nourished, in no acute distress. Head: Normocephalic, atraumatic, sclera non-icteric, mucus membranes are moist Neck: Supple. JVD not elevated. Lungs: Clear bilaterally to auscultation without wheezes, rales, or rhonchi. Breathing is unlabored. Heart: RRR with S1 S2. No murmurs, rubs, or gallops appreciated. SR on monitor with frequent sinus pauses-3-3.5 second.  Abdomen: Soft, non-tender, non-distended with normoactive bowel sounds. No rebound/guarding. No obvious abdominal masses. M-S:  Strength and tone appear normal for age. Lower extremities:L AKA well healed. RLE with 1+ ankle edema. Scratch R mid shin with erythema present Neuro: Alert and oriented X 3. Moves all extremities spontaneously. Psych:  Responds to questions appropriately with a normal affect. Dialysis Access: R femoral TDC Drsg CDI  Dialysis Orders: [place] T,Th,S 4 hours 400/Auto 1.5 EDW 66 kg 2.0 K/2.0 Ca Linear Na UF Profile 4 Heparin 2500 units IV q tx Mircera 50 mcg IV q 2 weeks (last dose 07/17/16 Last HGB 11.5 07/19/16) Hectoral 2 mcg IV q tx (Last PTH 802 07/05/16) Venofer 50 mg IV q week (Last dose 07/26/16 fe 97 Tsat 50%  07/05/16) Calcium acetate 667 mg 1 cap PO TID AC 1 cap w/snack   Assessment/Plan: 1.  AMS/Possible CVA:  Per primary. No acute findings on CT/MRA. Moderate to severe stenosis distal left PCA per MRA, Neurology following, swallowing eval pending. Mental status back to baseline.  2.  ESRD - T,Th,S [place]. K+ 3.9 labs drawn approx 1 hour post HD. Next tx Saturday unless otherwise needed.  3.  Hypertension/volume  - Hypertensive on arrival to ED. Has been on Coreg 12.5 mg PO BID as OP.  Left HD today 1.4 above EDW today. Has LE edema but does not appear to be volume overloaded. Shortened HD today but no further HD needs today. Will re-assess tomorrow.       4.  Anemia  - Jehovah Witness/does not receive blood products. HGB 12.2 No ESA needed. Follow CBC.  5.  Metabolic bone disease -  Cont OP binders, VDRA.  6.  Nutrition - Albumin 3.4. Renal/Carb mod diet, fld restrictions. Nepro/renal vit 7. H/O PE/Afib: on eliquis. Not clear when this was started. Per primary 8. Sinus Pauses noted on EKG: cardiology consulted. Coreg on hold.  9.   H/O RLE cellulitis: Has rec'd vanc-area still reddened. Monitor  [employer] Owens Shark, NP-C 07/26/2016, 5:05 PM  [employer] [phone]  Renal Attending: I agree with note and observation as articulated above by Ms Owens Shark.  She had symptomatic anemia associated with dialysis today and was noted to have sinus pauses on the cardiac monitor.  She has known atherosclerosis with evidence on MRA  Of brain.  We will support as needed with dialysis and attempt to avoid intra-dialytic hypotension. Jonise Weightman C

## 2016-07-26 NOTE — ED Triage Notes (Signed)
Patient came in by [facility] EMS with stroke like symptoms. Patient was at dialysis and developed AMS, slurred speech, and her pressure dropped in the 70s. LSW was 1300. Hx of afib and PE CVA, and L AKA. Patient takes eliquis. Last known dose of eliquis was yesterday. Patient currently has slow speech and loss of peripheral vision on the right. No complaints of pain. Strength and sensation equal bilaterally. EMS fsbs was 139. EMS BP was 160/80, HR 76, 100% on RA. Normal EKG. Dialysis access in the R groin.

## 2016-07-27 ENCOUNTER — Observation Stay (HOSPITAL_COMMUNITY): Payer: Medicare Other

## 2016-07-27 ENCOUNTER — Observation Stay (HOSPITAL_BASED_OUTPATIENT_CLINIC_OR_DEPARTMENT_OTHER): Payer: Medicare Other

## 2016-07-27 DIAGNOSIS — N2581 Secondary hyperparathyroidism of renal origin: Secondary | ICD-10-CM | POA: Diagnosis not present

## 2016-07-27 DIAGNOSIS — I6789 Other cerebrovascular disease: Secondary | ICD-10-CM

## 2016-07-27 DIAGNOSIS — Z8673 Personal history of transient ischemic attack (TIA), and cerebral infarction without residual deficits: Secondary | ICD-10-CM | POA: Diagnosis not present

## 2016-07-27 DIAGNOSIS — Z992 Dependence on renal dialysis: Secondary | ICD-10-CM | POA: Diagnosis not present

## 2016-07-27 DIAGNOSIS — R4182 Altered mental status, unspecified: Secondary | ICD-10-CM | POA: Diagnosis not present

## 2016-07-27 DIAGNOSIS — H539 Unspecified visual disturbance: Secondary | ICD-10-CM | POA: Diagnosis not present

## 2016-07-27 DIAGNOSIS — N186 End stage renal disease: Secondary | ICD-10-CM | POA: Diagnosis not present

## 2016-07-27 DIAGNOSIS — D631 Anemia in chronic kidney disease: Secondary | ICD-10-CM | POA: Diagnosis not present

## 2016-07-27 DIAGNOSIS — I639 Cerebral infarction, unspecified: Secondary | ICD-10-CM | POA: Diagnosis not present

## 2016-07-27 DIAGNOSIS — I10 Essential (primary) hypertension: Secondary | ICD-10-CM | POA: Diagnosis not present

## 2016-07-27 DIAGNOSIS — I120 Hypertensive chronic kidney disease with stage 5 chronic kidney disease or end stage renal disease: Secondary | ICD-10-CM | POA: Diagnosis not present

## 2016-07-27 LAB — GLUCOSE, CAPILLARY
GLUCOSE-CAPILLARY: 153 mg/dL — AB (ref 65–99)
GLUCOSE-CAPILLARY: 216 mg/dL — AB (ref 65–99)
GLUCOSE-CAPILLARY: 101 mg/dL — AB (ref 65–99)
Glucose-Capillary: 112 mg/dL — ABNORMAL HIGH (ref 65–99)
Glucose-Capillary: 118 mg/dL — ABNORMAL HIGH (ref 65–99)
Glucose-Capillary: 123 mg/dL — ABNORMAL HIGH (ref 65–99)
Glucose-Capillary: 158 mg/dL — ABNORMAL HIGH (ref 65–99)
Glucose-Capillary: 42 mg/dL — CL (ref 65–99)
Glucose-Capillary: 58 mg/dL — ABNORMAL LOW (ref 65–99)

## 2016-07-27 LAB — PHOSPHORUS: Phosphorus: 5 mg/dL — ABNORMAL HIGH (ref 2.5–4.6)

## 2016-07-27 LAB — ECHOCARDIOGRAM COMPLETE
Height: 66 in
WEIGHTICAEL: 2430.35 [oz_av]

## 2016-07-27 LAB — LIPID PANEL
CHOL/HDL RATIO: 2.4 ratio
Cholesterol: 161 mg/dL (ref 0–200)
HDL: 66 mg/dL (ref >40)
LDL CALC: 85 mg/dL (ref 0–99)
Triglycerides: 52 mg/dL (ref <150)
VLDL: 10 mg/dL (ref 0–40)

## 2016-07-27 MED ORDER — DEXTROSE 10 % IV SOLN
INTRAVENOUS | Status: DC
Start: 2016-07-27 — End: 2016-07-27

## 2016-07-27 MED ORDER — DEXTROSE 50 % IV SOLN
INTRAVENOUS | Status: AC
  Administered 2016-07-27: 50 mL via INTRAVENOUS
  Filled 2016-07-27: qty 50

## 2016-07-27 MED ORDER — DEXTROSE 50 % IV SOLN
1.0000 | Freq: Once | INTRAVENOUS | Status: AC
  Administered 2016-07-27 (×2): 50 mL via INTRAVENOUS

## 2016-07-27 MED ORDER — DOXERCALCIFEROL 4 MCG/2ML IV SOLN
4.0000 ug | INTRAVENOUS | Status: DC
  Administered 2016-07-28: 4 ug via INTRAVENOUS
  Filled 2016-07-27: qty 2

## 2016-07-27 MED ORDER — DEXTROSE 50 % IV SOLN
INTRAVENOUS | Status: AC
  Administered 2016-07-27: 50 mL
  Filled 2016-07-27: qty 50

## 2016-07-27 MED ORDER — DOXERCALCIFEROL 4 MCG/2ML IV SOLN: 2.0000 ug | INTRAVENOUS | Status: DC

## 2016-07-27 NOTE — Progress Notes (Signed)
VASCULAR LAB PRELIMINARY  PRELIMINARY  PRELIMINARY  PRELIMINARY  Carotid duplex completed.    Preliminary report:  Right :  40-59% internal carotid artery stenosis. Left : 1% to 39% internal carotid artery stenosis.  Bilateral - vertebral artery flow is antegrade.  Amisha Pospisil, RVS 07/27/2016, 1:21 PM

## 2016-07-27 NOTE — Progress Notes (Signed)
[facility] Progress Note   Dialysis Orders: [facility] T,Th,S 4 hours 400/Auto 1.5 EDW 66 kg 2.0 K/2.0 Ca Linear Na UF Profile 4 Heparin 2500 units IV q tx Mircera 50 mcg IV q 2 weeks (last dose 07/17/16 Last HGB 11.5 07/19/16) Hectoral 2 mcg IV q tx (Last PTH 802 07/05/16) Venofer 50 mg IV q week (Last dose 07/26/16 fe 97 Tsat 50% 07/05/16) Calcium acetate 667 mg 1 cap PO TID AC 1 cap w/snack  Assessment/Plan: 1. CVA vsTIA - per primary- Neuro thinks she may have had an infarct- poor flow in PCA on the left; additional testing pending 2. ESRD - TTS - HD Saturday- start on 3 K bath 3. Anemia - hgb 11.1- ESA due 9/19/weekly Fe - not ordered yet - follow labs 4. Secondary hyperparathyroidism - current meds- resumed hectorol 5. HTN/volume - ok/ volume control 6. Nutrition -renal diet/multivit 7. HX PE/afib - on Eliquis Parker, PA-C [facility] Beeper [phone] 07/27/2016,10:04 AM  LOS: 0 days   Subjective:   Still having vision problems. Speech not quite right. Incontinent of urine - in bed - usually wears briefs  Objective Vitals:   07/27/16 0010 07/27/16 0206 07/27/16 0431 07/27/16 0657  BP: 128/65 (!) 138/55 (!) 128/53 130/87  Pulse: 82 83 67 60  Resp: 19 20 18 20   Temp: 98.5 F (36.9 C) 98.4 F (36.9 C) 98.2 F (36.8 C) 98.4 F (36.9 C)  TempSrc: Oral Oral Oral Oral  SpO2: 100% 100% 100% 100%  Weight:      Height:       Physical Exam General: alert, speech a little thick, well oriented Heart: RRR Lungs: no rales Abdomen: soft NT Extremities: left aka right LE tr edema Dialysis Access: right fem cath   Additional Objective Labs: Basic Metabolic Panel:  Recent Labs Lab 07/26/16 1347 07/26/16 1356  NA 138 139  K 3.9 3.8  CL 103 100*  CO2 26  --   GLUCOSE 156* 153*  BUN 42* 42*  CREATININE 3.28* 3.30*  CALCIUM 8.5*  --    Liver Function Tests:  Recent Labs Lab 07/26/16 1347  AST 30  ALT 37   ALKPHOS 230*  BILITOT 0.5  PROT 6.7  ALBUMIN 3.4*   CBC:  Recent Labs Lab 07/26/16 1347 07/26/16 1356  WBC 6.2  --   NEUTROABS 3.7  --   HGB 11.1* 12.2  HCT 35.4* 36.0  MCV 96.5  --   PLT 166  --    Blood Culture    Component Value Date/Time   SDES BLOOD LEFT HAND 12/18/2015 1200   SPECREQUEST BOTTLES DRAWN AEROBIC AND ANAEROBIC 10CCS 12/18/2015 1200   CULT NO GROWTH 5 DAYS 12/18/2015 1200   REPTSTATUS 12/23/2015 FINAL 12/18/2015 1200    Cardiac Enzymes: No results for input(s): CKTOTAL, CKMB, CKMBINDEX, TROPONINI in the last 168 hours. CBG:  Recent Labs Lab 07/27/16 0006 07/27/16 0420 07/27/16 0445 07/27/16 0730 07/27/16 0804  GLUCAP 112* 42* 158* 58* 153*   Iron Studies: No results for input(s): IRON, TIBC, TRANSFERRIN, FERRITIN in the last 72 hours. Lab Results  Component Value Date   INR 1.22 07/26/2016   INR 1.23 01/19/2016   Studies/Results: Mr Jodene Nam Head Wo Contrast  Addendum Date: 07/26/2016   ADDENDUM REPORT: 07/26/2016 15:13 ADDENDUM: Study discussed by telephone with Dr. Roland Rack on 07/26/2016 at 1507 hours. Electronically Signed   By: Genevie Ann M.D.   On: 07/26/2016 15:13  Result Date: 07/26/2016 CLINICAL DATA:  70 year old female code stroke. Acute onset slurred speech. Initial encounter. Limited MRI protocol requested by Neurology to include only axial and coronal diffusion. EXAM: MRI HEAD WITHOUT CONTRAST LIMITED MRA HEAD WITHOUT CONTRAST TECHNIQUE: Multiplanar, multiecho pulse sequences of the brain and surrounding structures were obtained without intravenous contrast. Angiographic images of the head were obtained using MRA technique without contrast. COMPARISON:  Head CT without contrast 1347 hours today. FINDINGS: MRI HEAD FINDINGS Heterogeneity along the posterior limbs of both internal capsules, corresponding to heterogeneous hypodensity on the earlier CT. Facilitated diffusion in these areas indicating multiple chronic lacunar infarcts.  Probable superimposed chronic microhemorrhage accounting for some of the trace diffusion heterogeneity. Overall it is difficult to exclude tiny areas of restricted diffusion along the right corona radiata (series 3, image 25) and perhaps also the left caudate (as seen on series 4, image 17). No restricted diffusion elsewhere. No intracranial mass effect. No ventriculomegaly. MRA HEAD FINDINGS Antegrade flow in the posterior circulation with codominant distal vertebral arteries. Mild distal vertebral irregularity without stenosis. Right PICA is patent. Patent vertebrobasilar junction and bilateral AICA origins. No basilar stenosis. Normal SCA and PCA origins. Mild motion artifact at the level of the posterior communicating arteries, the left appears to be present. The right could be diminutive or absent. Motion artifact also appears to exaggerate signal loss in the distal left PCA which appears better preserved on source images. No proximal PCA occlusion. Antegrade flow in both ICA siphons. Moderate cavernous and proximal supraclinoid siphon irregularity. There probably is an atherosclerotic pseudo lesion of the left cavernous carotid directed inferiorly and laterally on series 5, image 83 (and series 503, image 8). Both carotid termini are patent. MCA and ACA origins are patent. Motion artifact affecting the M1 and A1 vessels which appear to remain normal. Bilateral MCA bifurcations are patent. Motion artifact continues into the second order vessels. No M2 occlusion identified. Visualized ACA branches appear within normal limits. IMPRESSION: 1. Negative for emergent large vessel occlusion. 2. Intermittently motion degraded intracranial MRA. Moderate to severe stenosis of the distal left PCA which may be exaggerated due to motion artifact. Extensive bilateral ICA siphon atherosclerosis with up to moderate siphon stenosis. Probable left cavernous segment atherosclerotic pseudo lesion (vs. a small 2 mm cavernous  segment aneurysm). 3. Diffusion weighted imaging is heterogeneous in the posterior deep gray matter suggesting the presence of chronic small vessel ischemia with microhemorrhage. 4. Difficult to exclude small acute lacunar infarcts of the right corona radiata, and left caudate. Electronically Signed: By: Genevie Ann M.D. On: 07/26/2016 15:05   Mr Brain Wo Contrast  Result Date: 07/26/2016 CLINICAL DATA:  Sudden onset speech difficulty. EXAM: MRI HEAD WITHOUT CONTRAST TECHNIQUE: Multiplanar, multiecho pulse sequences of the brain and surrounding structures were obtained without intravenous contrast. COMPARISON:  MRA of the brain 07/26/2016. FINDINGS: Brain: Areas of diffusion heterogeneity are again seen along the posterior limbs of the internal capsules without definite focal diffusion restriction. The midline structures are normal. There is confluent periventricular hyperintense T2 weighted signal compatible with chronic microvascular ischemia. There are multiple bilateral basal ganglia and thalamus lacunar infarcts that are chronic. No mass lesion or midline shift. No hydrocephalus or extra-axial fluid collection. Vascular: Major intracranial flow voids are preserved. Bilateral thalamic chronic microhemorrhage. No evidence of amyloid angiopathy. Skull and upper cervical spine: The visualized skull base, calvarium, upper cervical spine and extracranial soft tissues are normal. Sinuses/Orbits: No fluid levels or advanced mucosal thickening. No mastoid effusion. Normal  orbits. IMPRESSION: 1. No acute infarct. Areas of diffusion heterogeneity along the posterior limbs of the internal capsule are likely artifactual and may be secondary to susceptibility effects from nearby thalamocapsular hemosiderin deposition from chronic microhemorrhage. 2. Chronic microvascular ischemia and multiple old lacunar infarcts. Electronically Signed   By: Ulyses Jarred M.D.   On: 07/26/2016 22:45   Mr Brain Ltd W/o Cm  Addendum Date:  07/26/2016   ADDENDUM REPORT: 07/26/2016 15:13 ADDENDUM: Study discussed by telephone with Dr. Roland Rack on 07/26/2016 at 1507 hours. Electronically Signed   By: Genevie Ann M.D.   On: 07/26/2016 15:13   Result Date: 07/26/2016 CLINICAL DATA:  70 year old female code stroke. Acute onset slurred speech. Initial encounter. Limited MRI protocol requested by Neurology to include only axial and coronal diffusion. EXAM: MRI HEAD WITHOUT CONTRAST LIMITED MRA HEAD WITHOUT CONTRAST TECHNIQUE: Multiplanar, multiecho pulse sequences of the brain and surrounding structures were obtained without intravenous contrast. Angiographic images of the head were obtained using MRA technique without contrast. COMPARISON:  Head CT without contrast 1347 hours today. FINDINGS: MRI HEAD FINDINGS Heterogeneity along the posterior limbs of both internal capsules, corresponding to heterogeneous hypodensity on the earlier CT. Facilitated diffusion in these areas indicating multiple chronic lacunar infarcts. Probable superimposed chronic microhemorrhage accounting for some of the trace diffusion heterogeneity. Overall it is difficult to exclude tiny areas of restricted diffusion along the right corona radiata (series 3, image 25) and perhaps also the left caudate (as seen on series 4, image 17). No restricted diffusion elsewhere. No intracranial mass effect. No ventriculomegaly. MRA HEAD FINDINGS Antegrade flow in the posterior circulation with codominant distal vertebral arteries. Mild distal vertebral irregularity without stenosis. Right PICA is patent. Patent vertebrobasilar junction and bilateral AICA origins. No basilar stenosis. Normal SCA and PCA origins. Mild motion artifact at the level of the posterior communicating arteries, the left appears to be present. The right could be diminutive or absent. Motion artifact also appears to exaggerate signal loss in the distal left PCA which appears better preserved on source images. No  proximal PCA occlusion. Antegrade flow in both ICA siphons. Moderate cavernous and proximal supraclinoid siphon irregularity. There probably is an atherosclerotic pseudo lesion of the left cavernous carotid directed inferiorly and laterally on series 5, image 83 (and series 503, image 8). Both carotid termini are patent. MCA and ACA origins are patent. Motion artifact affecting the M1 and A1 vessels which appear to remain normal. Bilateral MCA bifurcations are patent. Motion artifact continues into the second order vessels. No M2 occlusion identified. Visualized ACA branches appear within normal limits. IMPRESSION: 1. Negative for emergent large vessel occlusion. 2. Intermittently motion degraded intracranial MRA. Moderate to severe stenosis of the distal left PCA which may be exaggerated due to motion artifact. Extensive bilateral ICA siphon atherosclerosis with up to moderate siphon stenosis. Probable left cavernous segment atherosclerotic pseudo lesion (vs. a small 2 mm cavernous segment aneurysm). 3. Diffusion weighted imaging is heterogeneous in the posterior deep gray matter suggesting the presence of chronic small vessel ischemia with microhemorrhage. 4. Difficult to exclude small acute lacunar infarcts of the right corona radiata, and left caudate. Electronically Signed: By: Genevie Ann M.D. On: 07/26/2016 15:05   Ct Head Code Stroke Wo Contrast`  Result Date: 07/26/2016 CLINICAL DATA:  Code stroke. Difficulty speaking, change in mental status. EXAM: CT HEAD WITHOUT CONTRAST TECHNIQUE: Contiguous axial images were obtained from the base of the skull through the vertex without intravenous contrast. COMPARISON:  None. FINDINGS: No  evidence for acute hemorrhage, mass lesion, hydrocephalus, or extra-axial fluid. Mild atrophy. Moderate hypoattenuation of white matter suggesting chronic microvascular ischemic change. BILATERAL lacunar infarcts affect the posterior limb internal capsule on the RIGHT and thalamus on  the LEFT. Asymmetric hypodensity of the LEFT internal capsule/ centrum semiovale (coronal image 36 series 203, axial image 14-15 series 201) could represent acute infarction. No visible cortical hypodensities. Advanced vascular calcification in the carotid siphons and supraclinoid internal carotid artery segments bilaterally. No hyperdense ICA terminus or M1 MCA. The calvarium is intact.  No sinus or mastoid disease. ASPECTS [facility] Stroke Program Early CT Score) - Ganglionic level infarction (caudate, lentiform nuclei, internal capsule, insula, M1-M3 cortex): 6 - Supraganglionic infarction (M4-M6 cortex): 3 Total score (0-10 with 10 being normal): 9 IMPRESSION: 1. Chronic changes as described. Cannot exclude early infarct in the LEFT internal capsule/centrum semiovale. 2. ASPECTS is 9 Text message of the above findings sent to the on-call neurologist with instructions to call back at 2:05 p.m. Electronically Signed   By: Staci Righter M.D.   On: 07/26/2016 14:07   Medications:   .  stroke: mapping our early stages of recovery book   Does not apply Once  . apixaban  5 mg Oral QHS  . aspirin EC  325 mg Oral Daily  . calcium acetate  667 mg Oral TID WC  . gabapentin  100 mg Oral TID  . insulin aspart  0-9 Units Subcutaneous Q4H  . insulin detemir  5 Units Subcutaneous QHS  . multivitamin  1 tablet Oral QHS

## 2016-07-27 NOTE — Progress Notes (Signed)
Hypoglycemic Event  CBG: 58  Treatment: D50 IV 50 mL  Symptoms: None  Follow-up CBG: Time: 08:05 CBG Result:153  Possible Reasons for Event: Medication regimen: NPO  Comments/MD notified:    Garey Alleva R

## 2016-07-27 NOTE — Progress Notes (Addendum)
[facility] PROGRESS NOTE  Deborah Hendricks [id number] DOB: [birth year] DOA: 07/26/2016   PCP: Rexene Agent, MD     Assessment/Plan: Principal Problem:   Visual changes Active Problems:   Type II diabetes mellitus with neurological manifestations ([place])   Essential hypertension   History of pulmonary embolism   History of stroke   ESRD on dialysis [facility])   Paroxysmal atrial fibrillation ([place])   Patient is Jehovah's Witness   Seizure disorder [facility])   70 y.o. 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 Patient was undergoing dialysis in the outpatient setting today and had completed about one half of her treatment when she was noticed to experienced abrupt onset change of mental status with facial drooping and slurred speech. Her blood pressure also dropped into the 70 systolic and quickly rebounded into the 180s.Patient was brought to the ER as a code stroke. Initial CT head unremarkable. Stat MRI MRA obtained. Patient's NIHSS was 4. She was not a candidate for TPA secondary to eliquis administration within 48 hours of presentation. She was evaluated by Dr. Leonel Ramsay with neurology. MRA did reveal severe stenosis of the left PCA versus occlusion and on exam patient was found by neurology to have right hemianopia and mild latency of speech. Neurology also felt that vascular intervention to the left PCA would not be in her favor since her symptoms were mild and she has severe vascular disease  Assessment and plan  Visual changes/ History of stroke -Patient presents with visual change as well as mental status change and transient worsening of  baseline aphasia and right-sided weakness concerning for possible TIA versus stroke -Could be related to transient hypotension -Appreciate neurology evaluation -Given known eliquis not TPA candidate and given mild symptomatology and severe vascular disease possible left PCA stenosis not appropriate for intervention per neurology -MRI could not exclude early lacunar infarcts on the right-for to neurology's interpretation of MRI films -Echo -Carotid duplex -Hemoglobin A1c and lipid panel -Regular neuro checks -Continue preadmission aspirin 325 mg and eliquis per neurology -PT/OT/SLP -Unfortunately due to prior history of dysphagia RN unable to complete bedside swallow evaluation per protocol recommendations -Not on statin prior to admission      ESRD on dialysis  -Completed half of today's dialysis -Nephrology aware of admission    Type II diabetes mellitus with neurological manifestations  -Follow-up on A1c, hypoglycemic on admission on multiple occasions -Follow CBGs and provide SSI Discontinued Levemir, started on D10     Essential hypertension -Apparent episodic hypotension during dialysis now resolved -Holding carvedilol (see below)    History of pulmonary embolism -Continue eliquis    Paroxysmal atrial fibrillation  -Continue eliquis -Patient currently maintaining first degree AV block with sinus etiology which was documented on 12-lead as well as bedside telemetry; also noted with episodes of very brief sinus pauses versus sinus arrhythmia -Hold carvedilol for now, no significant pauses noted overnight -CHADVASc = 7      Patient is Jehovah's Witness -No blood products    ?? Seizure disorder  EEG ordered by admitting physician      PVD/post left AKA (Feb 2017) -Primarily wheelchair-bound secondary to ill fitting prosthesis      DVT prophylaxsis eliquis   Code  Status:  DO NOT RESUSCITATE    Family Communication: Discussed in detail with the  patient, all imaging results, lab results explained to the patient   Disposition Plan:  Anticipate discharge tomorrow      Consultants:  Neurology  Nephrology    Procedures:  None  Antibiotics: Anti-infectives    None         HPI/Subjective:  Patient continues to have stuttering speech, states that this has been her baseline since her second stroke  Objective: Vitals:   07/27/16 0010 07/27/16 0206 07/27/16 0431 07/27/16 0657  BP: 128/65 (!) 138/55 (!) 128/53 130/87  Pulse: 82 83 67 60  Resp: 19 20 18 20   Temp: 98.5 F (36.9 C) 98.4 F (36.9 C) 98.2 F (36.8 C) 98.4 F (36.9 C)  TempSrc: Oral Oral Oral Oral  SpO2: 100% 100% 100% 100%  Weight:      Height:        Intake/Output Summary (Last 24 hours) at 07/27/16 1017 Last data filed at 07/27/16 [id number]  Gross per 24 hour  Intake                0 ml  Output                0 ml  Net                0 ml    Exam:  Examination:  General exam: Appears calm and comfortable  Respiratory system: Clear to auscultation. Respiratory effort normal. Cardiovascular system: S1 & S2 heard, RRR. No JVD, murmurs, rubs, gallops or clicks. No pedal edema. Gastrointestinal system: Abdomen is nondistended, soft and nontender. No organomegaly or masses felt. Normal bowel sounds heard. Central nervous system: Alert and oriented. No focal neurological deficits. Extremities: Symmetric 5 x 5 power. Skin: No rashes, lesions or ulcers Psychiatry: Judgement and insight appear normal. Mood & affect appropriate.     Data Reviewed: I have personally reviewed following labs and imaging studies  Micro Results No results found for this or any previous visit (from the past 240 hour(s)).  Radiology Reports Mr Jodene Nam Head Wo Contrast  Addendum Date: 07/26/2016   ADDENDUM REPORT: 07/26/2016 15:13 ADDENDUM: Study discussed by telephone with Dr. Roland Rack on 07/26/2016 at 1507 hours. Electronically Signed   By: Genevie Ann M.D.   On:  07/26/2016 15:13   Result Date: 07/26/2016 CLINICAL DATA:  70 year old female code stroke. Acute onset slurred speech. Initial encounter. Limited MRI protocol requested by Neurology to include only axial and coronal diffusion. EXAM: MRI HEAD WITHOUT CONTRAST LIMITED MRA HEAD WITHOUT CONTRAST TECHNIQUE: Multiplanar, multiecho pulse sequences of the brain and surrounding structures were obtained without intravenous contrast. Angiographic images of the head were obtained using MRA technique without contrast. COMPARISON:  Head CT without contrast 1347 hours today. FINDINGS: MRI HEAD FINDINGS Heterogeneity along the posterior limbs of both internal capsules, corresponding to heterogeneous hypodensity on the earlier CT. Facilitated diffusion in these areas indicating multiple chronic lacunar infarcts. Probable superimposed chronic microhemorrhage accounting for some of the trace diffusion heterogeneity. Overall it is difficult to exclude tiny areas of restricted diffusion along the right corona radiata (series 3, image 25) and perhaps also the left caudate (as seen on series 4, image 17). No restricted diffusion elsewhere. No intracranial mass effect. No ventriculomegaly. MRA HEAD FINDINGS Antegrade flow in the posterior circulation with codominant distal vertebral arteries. Mild distal vertebral irregularity without stenosis. Right PICA is patent. Patent vertebrobasilar junction and bilateral AICA origins.  No basilar stenosis. Normal SCA and PCA origins. Mild motion artifact at the level of the posterior communicating arteries, the left appears to be present. The right could be diminutive or absent. Motion artifact also appears to exaggerate signal loss in the distal left PCA which appears better preserved on source images. No proximal PCA occlusion. Antegrade flow in both ICA siphons. Moderate cavernous and proximal supraclinoid siphon irregularity. There probably is an atherosclerotic pseudo lesion of the left  cavernous carotid directed inferiorly and laterally on series 5, image 83 (and series 503, image 8). Both carotid termini are patent. MCA and ACA origins are patent. Motion artifact affecting the M1 and A1 vessels which appear to remain normal. Bilateral MCA bifurcations are patent. Motion artifact continues into the second order vessels. No M2 occlusion identified. Visualized ACA branches appear within normal limits. IMPRESSION: 1. Negative for emergent large vessel occlusion. 2. Intermittently motion degraded intracranial MRA. Moderate to severe stenosis of the distal left PCA which may be exaggerated due to motion artifact. Extensive bilateral ICA siphon atherosclerosis with up to moderate siphon stenosis. Probable left cavernous segment atherosclerotic pseudo lesion (vs. a small 2 mm cavernous segment aneurysm). 3. Diffusion weighted imaging is heterogeneous in the posterior deep gray matter suggesting the presence of chronic small vessel ischemia with microhemorrhage. 4. Difficult to exclude small acute lacunar infarcts of the right corona radiata, and left caudate. Electronically Signed: By: Genevie Ann M.D. On: 07/26/2016 15:05   Mr Brain Wo Contrast  Result Date: 07/26/2016 CLINICAL DATA:  Sudden onset speech difficulty. EXAM: MRI HEAD WITHOUT CONTRAST TECHNIQUE: Multiplanar, multiecho pulse sequences of the brain and surrounding structures were obtained without intravenous contrast. COMPARISON:  MRA of the brain 07/26/2016. FINDINGS: Brain: Areas of diffusion heterogeneity are again seen along the posterior limbs of the internal capsules without definite focal diffusion restriction. The midline structures are normal. There is confluent periventricular hyperintense T2 weighted signal compatible with chronic microvascular ischemia. There are multiple bilateral basal ganglia and thalamus lacunar infarcts that are chronic. No mass lesion or midline shift. No hydrocephalus or extra-axial fluid collection.  Vascular: Major intracranial flow voids are preserved. Bilateral thalamic chronic microhemorrhage. No evidence of amyloid angiopathy. Skull and upper cervical spine: The visualized skull base, calvarium, upper cervical spine and extracranial soft tissues are normal. Sinuses/Orbits: No fluid levels or advanced mucosal thickening. No mastoid effusion. Normal orbits. IMPRESSION: 1. No acute infarct. Areas of diffusion heterogeneity along the posterior limbs of the internal capsule are likely artifactual and may be secondary to susceptibility effects from nearby thalamocapsular hemosiderin deposition from chronic microhemorrhage. 2. Chronic microvascular ischemia and multiple old lacunar infarcts. Electronically Signed   By: Ulyses Jarred M.D.   On: 07/26/2016 22:45   Mr Brain Ltd W/o Cm  Addendum Date: 07/26/2016   ADDENDUM REPORT: 07/26/2016 15:13 ADDENDUM: Study discussed by telephone with Dr. Roland Rack on 07/26/2016 at 1507 hours. Electronically Signed   By: Genevie Ann M.D.   On: 07/26/2016 15:13   Result Date: 07/26/2016 CLINICAL DATA:  70 year old female code stroke. Acute onset slurred speech. Initial encounter. Limited MRI protocol requested by Neurology to include only axial and coronal diffusion. EXAM: MRI HEAD WITHOUT CONTRAST LIMITED MRA HEAD WITHOUT CONTRAST TECHNIQUE: Multiplanar, multiecho pulse sequences of the brain and surrounding structures were obtained without intravenous contrast. Angiographic images of the head were obtained using MRA technique without contrast. COMPARISON:  Head CT without contrast 1347 hours today. FINDINGS: MRI HEAD FINDINGS Heterogeneity along the posterior limbs of both  internal capsules, corresponding to heterogeneous hypodensity on the earlier CT. Facilitated diffusion in these areas indicating multiple chronic lacunar infarcts. Probable superimposed chronic microhemorrhage accounting for some of the trace diffusion heterogeneity. Overall it is difficult to  exclude tiny areas of restricted diffusion along the right corona radiata (series 3, image 25) and perhaps also the left caudate (as seen on series 4, image 17). No restricted diffusion elsewhere. No intracranial mass effect. No ventriculomegaly. MRA HEAD FINDINGS Antegrade flow in the posterior circulation with codominant distal vertebral arteries. Mild distal vertebral irregularity without stenosis. Right PICA is patent. Patent vertebrobasilar junction and bilateral AICA origins. No basilar stenosis. Normal SCA and PCA origins. Mild motion artifact at the level of the posterior communicating arteries, the left appears to be present. The right could be diminutive or absent. Motion artifact also appears to exaggerate signal loss in the distal left PCA which appears better preserved on source images. No proximal PCA occlusion. Antegrade flow in both ICA siphons. Moderate cavernous and proximal supraclinoid siphon irregularity. There probably is an atherosclerotic pseudo lesion of the left cavernous carotid directed inferiorly and laterally on series 5, image 83 (and series 503, image 8). Both carotid termini are patent. MCA and ACA origins are patent. Motion artifact affecting the M1 and A1 vessels which appear to remain normal. Bilateral MCA bifurcations are patent. Motion artifact continues into the second order vessels. No M2 occlusion identified. Visualized ACA branches appear within normal limits. IMPRESSION: 1. Negative for emergent large vessel occlusion. 2. Intermittently motion degraded intracranial MRA. Moderate to severe stenosis of the distal left PCA which may be exaggerated due to motion artifact. Extensive bilateral ICA siphon atherosclerosis with up to moderate siphon stenosis. Probable left cavernous segment atherosclerotic pseudo lesion (vs. a small 2 mm cavernous segment aneurysm). 3. Diffusion weighted imaging is heterogeneous in the posterior deep gray matter suggesting the presence of chronic  small vessel ischemia with microhemorrhage. 4. Difficult to exclude small acute lacunar infarcts of the right corona radiata, and left caudate. Electronically Signed: By: Genevie Ann M.D. On: 07/26/2016 15:05   Ct Head Code Stroke Wo Contrast`  Result Date: 07/26/2016 CLINICAL DATA:  Code stroke. Difficulty speaking, change in mental status. EXAM: CT HEAD WITHOUT CONTRAST TECHNIQUE: Contiguous axial images were obtained from the base of the skull through the vertex without intravenous contrast. COMPARISON:  None. FINDINGS: No evidence for acute hemorrhage, mass lesion, hydrocephalus, or extra-axial fluid. Mild atrophy. Moderate hypoattenuation of white matter suggesting chronic microvascular ischemic change. BILATERAL lacunar infarcts affect the posterior limb internal capsule on the RIGHT and thalamus on the LEFT. Asymmetric hypodensity of the LEFT internal capsule/ centrum semiovale (coronal image 36 series 203, axial image 14-15 series 201) could represent acute infarction. No visible cortical hypodensities. Advanced vascular calcification in the carotid siphons and supraclinoid internal carotid artery segments bilaterally. No hyperdense ICA terminus or M1 MCA. The calvarium is intact.  No sinus or mastoid disease. ASPECTS [facility] Stroke Program Early CT Score) - Ganglionic level infarction (caudate, lentiform nuclei, internal capsule, insula, M1-M3 cortex): 6 - Supraganglionic infarction (M4-M6 cortex): 3 Total score (0-10 with 10 being normal): 9 IMPRESSION: 1. Chronic changes as described. Cannot exclude early infarct in the LEFT internal capsule/centrum semiovale. 2. ASPECTS is 9 Text message of the above findings sent to the on-call neurologist with instructions to call back at 2:05 p.m. Electronically Signed   By: Staci Righter M.D.   On: 07/26/2016 14:07     CBC  Recent Labs Lab  07/26/16 1347 07/26/16 1356  WBC 6.2  --   HGB 11.1* 12.2  HCT 35.4* 36.0  PLT 166  --   MCV 96.5  --   MCH 30.2   --   MCHC 31.4  --   RDW 16.3*  --   LYMPHSABS 1.3  --   MONOABS 0.8  --   EOSABS 0.4  --   BASOSABS 0.0  --     Chemistries   Recent Labs Lab 07/26/16 1347 07/26/16 1356  NA 138 139  K 3.9 3.8  CL 103 100*  CO2 26  --   GLUCOSE 156* 153*  BUN 42* 42*  CREATININE 3.28* 3.30*  CALCIUM 8.5*  --   AST 30  --   ALT 37  --   ALKPHOS 230*  --   BILITOT 0.5  --    ------------------------------------------------------------------------------------------------------------------ estimated creatinine clearance is 14.6 mL/min (by C-G formula based on SCr of 3.3 mg/dL (H)). ------------------------------------------------------------------------------------------------------------------ No results for input(s): HGBA1C in the last 72 hours. ------------------------------------------------------------------------------------------------------------------  Recent Labs  07/27/16 0547  CHOL 161  HDL 66  LDLCALC 85  TRIG 52  CHOLHDL 2.4   ------------------------------------------------------------------------------------------------------------------ No results for input(s): TSH, T4TOTAL, T3FREE, THYROIDAB in the last 72 hours.  Invalid input(s): FREET3 ------------------------------------------------------------------------------------------------------------------ No results for input(s): VITAMINB12, FOLATE, FERRITIN, TIBC, IRON, RETICCTPCT in the last 72 hours.  Coagulation profile  Recent Labs Lab 07/26/16 1347  INR 1.22    No results for input(s): DDIMER in the last 72 hours.  Cardiac Enzymes No results for input(s): CKMB, TROPONINI, MYOGLOBIN in the last 168 hours.  Invalid input(s): CK ------------------------------------------------------------------------------------------------------------------ Invalid input(s): POCBNP   CBG:  Recent Labs Lab 07/27/16 0006 07/27/16 0420 07/27/16 0445 07/27/16 0730 07/27/16 0804  GLUCAP 112* 42* 158* 58* 153*        Studies: Mr Jodene Nam Head Wo Contrast  Addendum Date: 07/26/2016   ADDENDUM REPORT: 07/26/2016 15:13 ADDENDUM: Study discussed by telephone with Dr. Roland Rack on 07/26/2016 at 1507 hours. Electronically Signed   By: Genevie Ann M.D.   On: 07/26/2016 15:13   Result Date: 07/26/2016 CLINICAL DATA:  70 year old female code stroke. Acute onset slurred speech. Initial encounter. Limited MRI protocol requested by Neurology to include only axial and coronal diffusion. EXAM: MRI HEAD WITHOUT CONTRAST LIMITED MRA HEAD WITHOUT CONTRAST TECHNIQUE: Multiplanar, multiecho pulse sequences of the brain and surrounding structures were obtained without intravenous contrast. Angiographic images of the head were obtained using MRA technique without contrast. COMPARISON:  Head CT without contrast 1347 hours today. FINDINGS: MRI HEAD FINDINGS Heterogeneity along the posterior limbs of both internal capsules, corresponding to heterogeneous hypodensity on the earlier CT. Facilitated diffusion in these areas indicating multiple chronic lacunar infarcts. Probable superimposed chronic microhemorrhage accounting for some of the trace diffusion heterogeneity. Overall it is difficult to exclude tiny areas of restricted diffusion along the right corona radiata (series 3, image 25) and perhaps also the left caudate (as seen on series 4, image 17). No restricted diffusion elsewhere. No intracranial mass effect. No ventriculomegaly. MRA HEAD FINDINGS Antegrade flow in the posterior circulation with codominant distal vertebral arteries. Mild distal vertebral irregularity without stenosis. Right PICA is patent. Patent vertebrobasilar junction and bilateral AICA origins. No basilar stenosis. Normal SCA and PCA origins. Mild motion artifact at the level of the posterior communicating arteries, the left appears to be present. The right could be diminutive or absent. Motion artifact also appears to exaggerate signal loss in the distal  left PCA which appears  better preserved on source images. No proximal PCA occlusion. Antegrade flow in both ICA siphons. Moderate cavernous and proximal supraclinoid siphon irregularity. There probably is an atherosclerotic pseudo lesion of the left cavernous carotid directed inferiorly and laterally on series 5, image 83 (and series 503, image 8). Both carotid termini are patent. MCA and ACA origins are patent. Motion artifact affecting the M1 and A1 vessels which appear to remain normal. Bilateral MCA bifurcations are patent. Motion artifact continues into the second order vessels. No M2 occlusion identified. Visualized ACA branches appear within normal limits. IMPRESSION: 1. Negative for emergent large vessel occlusion. 2. Intermittently motion degraded intracranial MRA. Moderate to severe stenosis of the distal left PCA which may be exaggerated due to motion artifact. Extensive bilateral ICA siphon atherosclerosis with up to moderate siphon stenosis. Probable left cavernous segment atherosclerotic pseudo lesion (vs. a small 2 mm cavernous segment aneurysm). 3. Diffusion weighted imaging is heterogeneous in the posterior deep gray matter suggesting the presence of chronic small vessel ischemia with microhemorrhage. 4. Difficult to exclude small acute lacunar infarcts of the right corona radiata, and left caudate. Electronically Signed: By: Genevie Ann M.D. On: 07/26/2016 15:05   Mr Brain Wo Contrast  Result Date: 07/26/2016 CLINICAL DATA:  Sudden onset speech difficulty. EXAM: MRI HEAD WITHOUT CONTRAST TECHNIQUE: Multiplanar, multiecho pulse sequences of the brain and surrounding structures were obtained without intravenous contrast. COMPARISON:  MRA of the brain 07/26/2016. FINDINGS: Brain: Areas of diffusion heterogeneity are again seen along the posterior limbs of the internal capsules without definite focal diffusion restriction. The midline structures are normal. There is confluent periventricular  hyperintense T2 weighted signal compatible with chronic microvascular ischemia. There are multiple bilateral basal ganglia and thalamus lacunar infarcts that are chronic. No mass lesion or midline shift. No hydrocephalus or extra-axial fluid collection. Vascular: Major intracranial flow voids are preserved. Bilateral thalamic chronic microhemorrhage. No evidence of amyloid angiopathy. Skull and upper cervical spine: The visualized skull base, calvarium, upper cervical spine and extracranial soft tissues are normal. Sinuses/Orbits: No fluid levels or advanced mucosal thickening. No mastoid effusion. Normal orbits. IMPRESSION: 1. No acute infarct. Areas of diffusion heterogeneity along the posterior limbs of the internal capsule are likely artifactual and may be secondary to susceptibility effects from nearby thalamocapsular hemosiderin deposition from chronic microhemorrhage. 2. Chronic microvascular ischemia and multiple old lacunar infarcts. Electronically Signed   By: Ulyses Jarred M.D.   On: 07/26/2016 22:45   Mr Brain Ltd W/o Cm  Addendum Date: 07/26/2016   ADDENDUM REPORT: 07/26/2016 15:13 ADDENDUM: Study discussed by telephone with Dr. Roland Rack on 07/26/2016 at 1507 hours. Electronically Signed   By: Genevie Ann M.D.   On: 07/26/2016 15:13   Result Date: 07/26/2016 CLINICAL DATA:  70 year old female code stroke. Acute onset slurred speech. Initial encounter. Limited MRI protocol requested by Neurology to include only axial and coronal diffusion. EXAM: MRI HEAD WITHOUT CONTRAST LIMITED MRA HEAD WITHOUT CONTRAST TECHNIQUE: Multiplanar, multiecho pulse sequences of the brain and surrounding structures were obtained without intravenous contrast. Angiographic images of the head were obtained using MRA technique without contrast. COMPARISON:  Head CT without contrast 1347 hours today. FINDINGS: MRI HEAD FINDINGS Heterogeneity along the posterior limbs of both internal capsules, corresponding to  heterogeneous hypodensity on the earlier CT. Facilitated diffusion in these areas indicating multiple chronic lacunar infarcts. Probable superimposed chronic microhemorrhage accounting for some of the trace diffusion heterogeneity. Overall it is difficult to exclude tiny areas of restricted diffusion along the right corona  radiata (series 3, image 25) and perhaps also the left caudate (as seen on series 4, image 17). No restricted diffusion elsewhere. No intracranial mass effect. No ventriculomegaly. MRA HEAD FINDINGS Antegrade flow in the posterior circulation with codominant distal vertebral arteries. Mild distal vertebral irregularity without stenosis. Right PICA is patent. Patent vertebrobasilar junction and bilateral AICA origins. No basilar stenosis. Normal SCA and PCA origins. Mild motion artifact at the level of the posterior communicating arteries, the left appears to be present. The right could be diminutive or absent. Motion artifact also appears to exaggerate signal loss in the distal left PCA which appears better preserved on source images. No proximal PCA occlusion. Antegrade flow in both ICA siphons. Moderate cavernous and proximal supraclinoid siphon irregularity. There probably is an atherosclerotic pseudo lesion of the left cavernous carotid directed inferiorly and laterally on series 5, image 83 (and series 503, image 8). Both carotid termini are patent. MCA and ACA origins are patent. Motion artifact affecting the M1 and A1 vessels which appear to remain normal. Bilateral MCA bifurcations are patent. Motion artifact continues into the second order vessels. No M2 occlusion identified. Visualized ACA branches appear within normal limits. IMPRESSION: 1. Negative for emergent large vessel occlusion. 2. Intermittently motion degraded intracranial MRA. Moderate to severe stenosis of the distal left PCA which may be exaggerated due to motion artifact. Extensive bilateral ICA siphon atherosclerosis with  up to moderate siphon stenosis. Probable left cavernous segment atherosclerotic pseudo lesion (vs. a small 2 mm cavernous segment aneurysm). 3. Diffusion weighted imaging is heterogeneous in the posterior deep gray matter suggesting the presence of chronic small vessel ischemia with microhemorrhage. 4. Difficult to exclude small acute lacunar infarcts of the right corona radiata, and left caudate. Electronically Signed: By: Genevie Ann M.D. On: 07/26/2016 15:05   Ct Head Code Stroke Wo Contrast`  Result Date: 07/26/2016 CLINICAL DATA:  Code stroke. Difficulty speaking, change in mental status. EXAM: CT HEAD WITHOUT CONTRAST TECHNIQUE: Contiguous axial images were obtained from the base of the skull through the vertex without intravenous contrast. COMPARISON:  None. FINDINGS: No evidence for acute hemorrhage, mass lesion, hydrocephalus, or extra-axial fluid. Mild atrophy. Moderate hypoattenuation of white matter suggesting chronic microvascular ischemic change. BILATERAL lacunar infarcts affect the posterior limb internal capsule on the RIGHT and thalamus on the LEFT. Asymmetric hypodensity of the LEFT internal capsule/ centrum semiovale (coronal image 36 series 203, axial image 14-15 series 201) could represent acute infarction. No visible cortical hypodensities. Advanced vascular calcification in the carotid siphons and supraclinoid internal carotid artery segments bilaterally. No hyperdense ICA terminus or M1 MCA. The calvarium is intact.  No sinus or mastoid disease. ASPECTS [facility] Stroke Program Early CT Score) - Ganglionic level infarction (caudate, lentiform nuclei, internal capsule, insula, M1-M3 cortex): 6 - Supraganglionic infarction (M4-M6 cortex): 3 Total score (0-10 with 10 being normal): 9 IMPRESSION: 1. Chronic changes as described. Cannot exclude early infarct in the LEFT internal capsule/centrum semiovale. 2. ASPECTS is 9 Text message of the above findings sent to the on-call neurologist with  instructions to call back at 2:05 p.m. Electronically Signed   By: Staci Righter M.D.   On: 07/26/2016 14:07      No results found for: HGBA1C Lab Results  Component Value Date   LDLCALC 85 07/27/2016   CREATININE 3.30 (H) 07/26/2016       Scheduled Meds: .  stroke: mapping our early stages of recovery book   Does not apply Once  . apixaban  5  mg Oral QHS  . aspirin EC  325 mg Oral Daily  . calcium acetate  667 mg Oral TID WC  . [START ON 07/28/2016] doxercalciferol  2 mcg Intravenous Q T,Th,Sa-HD  . gabapentin  100 mg Oral TID  . insulin aspart  0-9 Units Subcutaneous Q4H  . multivitamin  1 tablet Oral QHS   Continuous Infusions: . dextrose       LOS: 0 days    Time spent: >30 MINS    [facility]  [facility] Pager [phone]. If 7PM-7AM, please contact night-coverage at [email or web address], password [facility] 07/27/2016, 10:17 AM  LOS: 0 days

## 2016-07-27 NOTE — Evaluation (Signed)
Clinical/Bedside Swallow Evaluation Patient Details  Name: Deborah Hendricks MRN: [id number] Date of Birth: 06/08/1946  Today's Date: 07/27/2016 Time: SLP Start Time (ACUTE ONLY): 0800 SLP Stop Time (ACUTE ONLY): 0829 SLP Time Calculation (min) (ACUTE ONLY): 29 min  Past Medical History:  Past Medical History:  Diagnosis Date  . Anemia   . Anxiety   . CHF (congestive heart failure) ([place])   . Constipation   . Critical lower limb ischemia   . Depression   . Diabetes mellitus without complication ([place])   . Hypertension   . Kidney disease    ESRD  . Memory loss    since stroke 04/2015  . Peripheral vascular disease ([place])   . Seizures ([place]) 04/2015   since   . Stroke ([place]) 04/2015   unable to stand .  can have diffuuclty with speech at times   Past Surgical History:  Past Surgical History:  Procedure Laterality Date  . ABDOMINAL HYSTERECTOMY    . AMPUTATION Left 12/15/2015   Procedure: LEFT ABOVE KNEE AMPUTATION;  Surgeon: Serafina Mitchell, MD;  Location: [place];  Service: Vascular;  Laterality: Left;  . BASCILIC VEIN TRANSPOSITION Right 11/25/2015   Procedure: Right Arm FIRST STAGE BASILIC VEIN TRANSPOSITION;  Surgeon: Serafina Mitchell, MD;  Location: [place];  Service: Vascular;  Laterality: Right;  . BASCILIC VEIN TRANSPOSITION Right 01/19/2016   Procedure: RIGHT SECOND STAGE BASILIC VEIN TRANSPOSITION;  Surgeon: Serafina Mitchell, MD;  Location: [place];  Service: Vascular;  Laterality: Right;  . PERIPHERAL VASCULAR CATHETERIZATION Bilateral 11/28/2015   Procedure: Lower Extremity Angiography;  Surgeon: Lorretta Harp, MD;  Location: [place] CV LAB;  Service: Cardiovascular;  Laterality: Bilateral;  . PERIPHERAL VASCULAR CATHETERIZATION N/A 11/28/2015   Procedure: Abdominal Aortogram;  Surgeon: Lorretta Harp, MD;  Location: [place] CV LAB;  Service: Cardiovascular;  Laterality: N/A;  . TOE AMPUTATION     R foot  . TONSILLECTOMY     HPI:  70 yo female adm to [facility] with vision  disturbance and worsening dysarthria.  PMH + for ESRD, multiple CVAs, anxiety, DM, constipation, memory loss from prior CVA, PVD.  Pt MRI showed ? small right corona radiata and left caudate nucleus CVA.  Pt lives with her daughter and helps at home with dishes and cleeaning her own room.  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  Prior strokes impacted right side - causing some weakness.     Assessment / Plan / Recommendation Clinical Impression  Pt presents with functional oropharyngeal swallow ability.  She does have decreased right facial sensation but otherwise CN exam unremarkable.  Swallow was overall timely with clear voice t/o.  Pt did complain ot sensation of pharyngeal residual that cleared with liquid swallow.  She has h/o GERD but it does not appear she is on a reflux medication - advised her to speak to MD re: these possible concerns.  Recommend regular/thin diet with general precautions.  SLP educated pt to dysphagia mitigation strategies to use if indicated.     Aspiration Risk  Mild aspiration risk    Diet Recommendation Thin liquid;Regular   Liquid Administration via: Cup;Straw Medication Administration: Whole meds with liquid Supervision: Patient able to self feed Compensations: Slow rate;Small sips/bites    Other  Recommendations Oral Care Recommendations: Oral care BID   Follow up Recommendations  Visit Diagnosis   None   Stroke ([place]) - Plan: CT HEAD CODE STROKE WO CONTRAST`, CT HEAD CODE STROKE WO CONTRAST`, CANCELED:  CT ANGIO HEAD W OR WO CONTRAST, CANCELED: CT ANGIO HEAD W OR WO CONTRAST, CANCELED: CT ANGIO NECK W OR WO CONTRAST, CANCELED: CT ANGIO NECK W OR WO CONTRAST  Stroke-like symptoms - Plan: MR Brain Ltd W/O Cm, MR Brain Ltd W/O Cm, CANCELED: MR Brain Wo Contrast, CANCELED: MR Brain Wo Contrast  Stroke (cerebrum) (HCC) - Plan: MR MRA HEAD WO CONTRAST, MR MRA HEAD WO CONTRAST, MR Brain Wo Contrast, MR Brain Wo Contrast    Frequency and Duration             Prognosis        Swallow Study   General Date of Onset: 07/27/16 HPI: 70 yo female adm to [facility] with vision disturbance and worsening dysarthria.  PMH + for ESRD, multiple CVAs, anxiety, DM, constipation, memory loss from prior CVA, PVD.  Pt MRI showed ? small right corona radiata and left caudate nucleus CVA.  Pt lives with her daughter and helps at home with dishes and cleeaning her own room.  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  Prior strokes impacted right side - causing some weakness.   Type of Study: Bedside Swallow Evaluation Previous Swallow Assessment: bse 12/19/2015 reg/thin recommended, pt also has seen SLP at SNF with recommendations for reg/thin alternating solids/liquids Diet Prior to this Study: NPO Temperature Spikes Noted: No Respiratory Status: Room air History of Recent Intubation: No Behavior/Cognition: Alert;Cooperative;Pleasant mood Oral Cavity Assessment: Within Functional Limits Oral Care Completed by SLP: No Oral Cavity - Dentition: Adequate natural dentition Vision: Functional for self-feeding Self-Feeding Abilities: Able to feed self Patient Positioning: Upright in bed Baseline Vocal Quality: Normal Volitional Cough: Strong Volitional Swallow: Able to elicit    Oral/Motor/Sensory Function Overall Oral Motor/Sensory Function: Mild impairment Facial Sensation: Reduced right Lingual Strength: Reduced Lingual Sensation: Reduced   Ice Chips Ice chips: Not tested   Thin Liquid Thin Liquid: Within functional limits Presentation: Cup;Self Fed;Straw    Nectar Thick Nectar Thick Liquid: Not tested   Honey Thick Honey Thick Liquid: Not tested   Puree Puree: Within functional limits Presentation: Self Fed;Spoon Other Comments: pt reports delayed oral coordination but no residuals   Solid   GO   Solid: Within functional limits Presentation: Self Fed Other Comments: slow but effective mastication    Functional Assessment Tool Used: clinical  judgement Functional Limitations: Swallowing Swallow Current Status [id number]): At least 1 percent but less than 20 percent impaired, limited or restricted Swallow Goal Status [phone]): At least 1 percent but less than 20 percent impaired, limited or restricted Swallow Discharge Status [phone]): At least 1 percent but less than 20 percent impaired, limited or restricted   Luanna Salk, [facility] SLP [phone]

## 2016-07-27 NOTE — Progress Notes (Signed)
Subjective: Feels back to baseline.   Exam: Vitals:   07/27/16 0657 07/27/16 1209  BP: 130/87 (!) 145/64  Pulse: 60 66  Resp: 20 19  Temp: 98.4 F (36.9 C) 98 F (36.7 C)   Gen: In bed, NAD Resp: non-labored breathing, no acute distress Abd: soft, nt  Neuro: MS: awake, alert, interactive and appropriate.  CN:R hemianopia, mild right facial weakness.   Repeat MRI and EEG are negative.   Impression: 70 yo F with episode of hypotension on dialysis. I suspect that her symptoms were wholly due to her hypotension. Continue secondary stroke prevention measures as before, though would conisder increasing statin given LDL of 85(goal 70).  I strongly suspect that her right hemianopia is a baseline findingfrom old strokes. Her left temporal slowing could be as well.   Recommendations: 1) No further workup at this time. Please call with further questions or concerns.   Roland Rack, MD [facility] [phone]  If 7pm- 7am, please page neurology on call as listed in [place].

## 2016-07-27 NOTE — Progress Notes (Signed)
Echocardiogram 2D Echocardiogram has been performed.  Deborah Hendricks 07/27/2016, 11:15 AM

## 2016-07-27 NOTE — Progress Notes (Signed)
PT Cancellation Note  Patient Details Name: Deborah Hendricks MRN: [id number] DOB: [birth year]   Cancelled Treatment:    Reason Eval/Treat Not Completed: Patient at procedure or test/unavailable.  Pt is having echocardiogram off the  Floor and will try later as time and pt allow.   Ramond Dial 07/27/2016, 10:02 AM    Mee Hives, PT MS Acute Rehab Dept. Number: [place]

## 2016-07-27 NOTE — Progress Notes (Signed)
OT Cancellation Note  Patient Details Name: Deborah Hendricks MRN: [id number] DOB: Nov 16, 1945   Cancelled Treatment:    Reason Eval/Treat Not Completed: Patient at procedure or test/ unavailable;Other (comment) (echo). Will reattempt as schedule permits.  Tyrone Schimke OTR/L Pager: [phone]  07/27/2016, 10:01 AM

## 2016-07-27 NOTE — Progress Notes (Signed)
EEG completed, results pending. 

## 2016-07-27 NOTE — Procedures (Signed)
ELECTROENCEPHALOGRAM REPORT  Date of Study: 07/27/2016  Patient's Name: Deborah Hendricks MRN: [id number] Date of Birth: 12/25/1945  Referring Provider: Dr. Roland Rack  Clinical History: This is a 70 year old woman with altered mental status, facial droop, slurred speech, and visual disturbance.  Medications: gabapentin (NEURONTIN) capsule 100 mg  apixaban (ELIQUIS) tablet 5 mg  aspirin EC tablet 325 mg  calcium acetate (PHOSLO) capsule 667 mg  insulin aspart (novoLOG) injection 0-9 Units  insulin detemir (LEVEMIR) injection 5 Units  multivitamin (RENA-VIT) tablet 1 tablet   Technical Summary: A multichannel digital EEG recording measured by the international 10-20 system with electrodes applied with paste and impedances below 5000 ohms performed in our laboratory with EKG monitoring in an awake and asleep patient.  Hyperventilation and photic stimulation were not performed.  The digital EEG was referentially recorded, reformatted, and digitally filtered in a variety of bipolar and referential montages for optimal display.    Description: The patient is awake and asleep during the recording.  During maximal wakefulness, there is a symmetric, medium voltage 9 Hz posterior dominant rhythm that attenuates with eye opening.  There is occasional focal slowing seen over the left temporal region. During drowsiness and sleep, there is an increase in theta slowing of the background.  Vertex waves and symmetric sleep spindles were seen.  Hyperventilation and photic stimulation were not performed. There were no epileptiform discharges or electrographic seizures seen.    EKG lead showed irregular rhythm.  Impression: This awake and asleep EEG is abnormal due to occasional focal slowing over the left temporal region.  Clinical Correlation of the above findings indicates focal cerebral dysfunction over the left temporal region suggestive of underlying structural or physiologic abnormality. The  absence of epileptiform discharges does not exclude a clinical diagnosis of epilepsy. Clinical correlation is advised.   Ellouise Newer, M.D.

## 2016-07-27 NOTE — Evaluation (Signed)
Speech Language Pathology Evaluation Patient Details Name: Deborah Hendricks MRN: [id number] DOB: 15-Sep-1946 Today's Date: 07/27/2016 Time: [id number] SLP Time Calculation (min) (ACUTE ONLY): 38 min  Problem List:  Patient Active Problem List   Diagnosis Date Noted  . Visual changes 07/26/2016  . Asymmetric SNHL (sensorineural hearing loss) 06/12/2016  . Essential hypertension 06/08/2016  . History of pulmonary embolism 06/08/2016  . History of stroke 06/08/2016  . History of borderline personality disorder 04/19/2016  . History of medication noncompliance 04/19/2016  . Paroxysmal atrial fibrillation ([place]) 04/19/2016  . Patient is Jehovah's Witness 04/19/2016  . Seizure disorder ([place]) 04/19/2016  . Renovascular hypertension 04/13/2016  . ESRD on dialysis ([place]) 04/11/2016  . Generalized anxiety disorder 04/11/2016  . Major depression, recurrent, chronic ([place]) 04/11/2016  . S/P AKA (above knee amputation) unilateral ([place]) 04/11/2016  . PAD (peripheral artery disease) ([place]) 12/15/2015  . Critical lower limb ischemia 11/16/2015  . Type 2 diabetes mellitus with left diabetic foot ulcer ([place]) 10/21/2015  . Type II diabetes mellitus with neurological manifestations ([place]) 10/21/2015   Past Medical History:  Past Medical History:  Diagnosis Date  . Anemia   . Anxiety   . CHF (congestive heart failure) ([place])   . Constipation   . Critical lower limb ischemia   . Depression   . Diabetes mellitus without complication ([place])   . Hypertension   . Kidney disease    ESRD  . Memory loss    since stroke 04/2015  . Peripheral vascular disease ([place])   . Seizures ([place]) 04/2015   since   . Stroke ([place]) 04/2015   unable to stand .  can have diffuuclty with speech at times   Past Surgical History:  Past Surgical History:  Procedure Laterality Date  . ABDOMINAL HYSTERECTOMY    . AMPUTATION Left 12/15/2015   Procedure: LEFT ABOVE KNEE AMPUTATION;  Surgeon: Serafina Mitchell, MD;  Location: [place];   Service: Vascular;  Laterality: Left;  . BASCILIC VEIN TRANSPOSITION Right 11/25/2015   Procedure: Right Arm FIRST STAGE BASILIC VEIN TRANSPOSITION;  Surgeon: Serafina Mitchell, MD;  Location: [place];  Service: Vascular;  Laterality: Right;  . BASCILIC VEIN TRANSPOSITION Right 01/19/2016   Procedure: RIGHT SECOND STAGE BASILIC VEIN TRANSPOSITION;  Surgeon: Serafina Mitchell, MD;  Location: [place];  Service: Vascular;  Laterality: Right;  . PERIPHERAL VASCULAR CATHETERIZATION Bilateral 11/28/2015   Procedure: Lower Extremity Angiography;  Surgeon: Lorretta Harp, MD;  Location: [place] CV LAB;  Service: Cardiovascular;  Laterality: Bilateral;  . PERIPHERAL VASCULAR CATHETERIZATION N/A 11/28/2015   Procedure: Abdominal Aortogram;  Surgeon: Lorretta Harp, MD;  Location: [place] CV [facility];  Service: Cardiovascular;  Laterality: N/A;  . TOE AMPUTATION     R foot  . TONSILLECTOMY     HPI:  70 yo female adm to [facility] with vision disturbance and worsening dysarthria.  PMH + for ESRD, multiple CVAs, anxiety, DM, constipation, memory loss from prior CVA, PVD.  Pt MRI showed ? small right corona radiata and left caudate nucleus CVA.  Pt lives with her daughter and helps at home with dishes and cleeaning her own room.  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  Prior strokes impacted right side - causing some weakness.     Assessment / Plan / Recommendation Clinical Impression  Pt presents with baseline cognitive linguistic deficits = scoring on MOCA was 17/30 indicating severe deficits.  Strengths were in areas of attention, naming, abstract though.  Difficulties including  areas of delayed recall 1 word independently, 4/5 with cue,  mental math, and visuospatial/executive skills.  Speech is mildly dysarthric with imprecise articulation most notably at multisyllabic level.  Caregiver Tiffany arrived during session and both pt/caregiver report pt's speech to be at baseline level.  Pt also admits to memory and  occasional word finding deficits but she ws fluent during evaluation.  As pt appears to have appropriate support at home and is at baseline, no follow up recommended.  Advised to continue to check to assure pt is taking medications appropriately, etc.  Thanks for this referral.      SLP Assessment  Patient does not need any further Speech Lanaguage Pathology Services    Follow Up Recommendations  None    Frequency and Duration   n/a        SLP Evaluation Cognition  Overall Cognitive Status: History of cognitive impairments - at baseline Arousal/Alertness: Awake/alert Orientation Level: Oriented X4 Attention: Sustained Sustained Attention: Appears intact Selective Attention:  (dnt, pt is hearing impaired) Memory: Impaired Memory Impairment: Retrieval deficit (1/5 words independent, 4/5 with cues) Awareness: Appears intact Safety/Judgment: Appears intact    SLP Diagnosis   Stroke ([place]) - Plan: CT HEAD CODE STROKE WO CONTRAST`, CT HEAD CODE STROKE WO CONTRAST`, CANCELED: CT ANGIO HEAD W OR WO CONTRAST, CANCELED: CT ANGIO HEAD W OR WO CONTRAST, CANCELED: CT ANGIO NECK W OR WO CONTRAST, CANCELED: CT ANGIO NECK W OR WO CONTRAST  Stroke-like symptoms - Plan: MR Brain Ltd W/O Cm, MR Brain Ltd W/O Cm, CANCELED: MR Brain Wo Contrast, CANCELED: MR Brain Wo Contrast  Stroke (cerebrum) (HCC) - Plan: MR MRA HEAD WO CONTRAST, MR MRA HEAD WO CONTRAST, MR Brain Wo Contrast, MR Brain Wo Contrast    Comprehension  Auditory Comprehension Overall Auditory Comprehension: Appears within functional limits for tasks assessed Yes/No Questions: Not tested Commands: Within Functional Limits Conversation: Complex Interfering Components: Hearing (pt has hearing aid for left ear, battery is dead so can not use currently) EffectiveTechniques: Repetition Visual Recognition/Discrimination Discrimination: Within Function Limits Reading Comprehension Reading Status: Within funtional limits    Expression  Expression Primary Mode of Expression: Verbal Verbal Expression Overall Verbal Expression: Appears within functional limits for tasks assessed Initiation: No impairment Level of Generative/Spontaneous Verbalization: Conversation Repetition: Impaired Level of Impairment: Sentence level (at complex sentence level pt with difficulty with 1/15 words *content word) Pragmatics: No impairment Written Expression Dominant Hand: Left Written Expression: Within Functional Limits   Oral / Motor  Oral Motor/Sensory Function Overall Oral Motor/Sensory Function: Mild impairment Facial Sensation: Reduced right Lingual Strength: Reduced Lingual Sensation: Reduced Motor Speech Overall Motor Speech: Impaired at baseline Respiration: Within functional limits Phonation: Low vocal intensity Resonance: Within functional limits Articulation: Impaired Level of Impairment: Conversation Intelligibility: Intelligibility reduced Sentence: 75-100% accurate Conversation: 75-100% accurate Motor Planning: Witnin functional limits Interfering Components: Premorbid status Effective Techniques: Slow rate   GO                    Janett Labella [place], [place] [facility] SLP [phone]

## 2016-07-28 DIAGNOSIS — I120 Hypertensive chronic kidney disease with stage 5 chronic kidney disease or end stage renal disease: Secondary | ICD-10-CM | POA: Diagnosis not present

## 2016-07-28 DIAGNOSIS — R4182 Altered mental status, unspecified: Secondary | ICD-10-CM | POA: Diagnosis not present

## 2016-07-28 DIAGNOSIS — H539 Unspecified visual disturbance: Secondary | ICD-10-CM | POA: Diagnosis not present

## 2016-07-28 DIAGNOSIS — N186 End stage renal disease: Secondary | ICD-10-CM | POA: Diagnosis not present

## 2016-07-28 DIAGNOSIS — Z992 Dependence on renal dialysis: Secondary | ICD-10-CM

## 2016-07-28 DIAGNOSIS — Z8673 Personal history of transient ischemic attack (TIA), and cerebral infarction without residual deficits: Secondary | ICD-10-CM

## 2016-07-28 DIAGNOSIS — D631 Anemia in chronic kidney disease: Secondary | ICD-10-CM | POA: Diagnosis not present

## 2016-07-28 DIAGNOSIS — I639 Cerebral infarction, unspecified: Secondary | ICD-10-CM | POA: Diagnosis not present

## 2016-07-28 DIAGNOSIS — I10 Essential (primary) hypertension: Secondary | ICD-10-CM | POA: Diagnosis not present

## 2016-07-28 DIAGNOSIS — N2581 Secondary hyperparathyroidism of renal origin: Secondary | ICD-10-CM | POA: Diagnosis not present

## 2016-07-28 LAB — GLUCOSE, CAPILLARY
GLUCOSE-CAPILLARY: 88 mg/dL (ref 65–99)
Glucose-Capillary: 168 mg/dL — ABNORMAL HIGH (ref 65–99)
Glucose-Capillary: 169 mg/dL — ABNORMAL HIGH (ref 65–99)
Glucose-Capillary: 188 mg/dL — ABNORMAL HIGH (ref 65–99)
Glucose-Capillary: 91 mg/dL (ref 65–99)
Glucose-Capillary: 98 mg/dL (ref 65–99)

## 2016-07-28 LAB — HEMOGLOBIN A1C
Hgb A1c MFr Bld: 6.3 % — ABNORMAL HIGH (ref 4.8–5.6)
Mean Plasma Glucose: 134 mg/dL

## 2016-07-28 LAB — MRSA PCR SCREENING: MRSA by PCR: NEGATIVE

## 2016-07-28 MED ORDER — HEPARIN SODIUM (PORCINE) 1000 UNIT/ML DIALYSIS: 20.0000 [IU]/kg | INTRAMUSCULAR | Status: DC | PRN

## 2016-07-28 MED ORDER — LIDOCAINE HCL (PF) 1 % IJ SOLN: 5.0000 mL | INTRAMUSCULAR | Status: DC | PRN

## 2016-07-28 MED ORDER — HYDRALAZINE HCL 20 MG/ML IJ SOLN: 10.0000 mg | INTRAMUSCULAR | Status: DC | PRN

## 2016-07-28 MED ORDER — PENTAFLUOROPROP-TETRAFLUOROETH EX AERO: 1.0000 "application " | INHALATION_SPRAY | CUTANEOUS | Status: DC | PRN

## 2016-07-28 MED ORDER — LIDOCAINE-PRILOCAINE 2.5-2.5 % EX CREA: 1.0000 "application " | TOPICAL_CREAM | CUTANEOUS | Status: DC | PRN

## 2016-07-28 MED ORDER — CARVEDILOL 12.5 MG PO TABS: 12.5000 mg | ORAL_TABLET | Freq: Two times a day (BID) | ORAL | Status: DC

## 2016-07-28 MED ORDER — DOXERCALCIFEROL 4 MCG/2ML IV SOLN
INTRAVENOUS | Status: AC
  Administered 2016-07-28: 4 ug via INTRAVENOUS
  Filled 2016-07-28: qty 2

## 2016-07-28 MED ORDER — HEPARIN SODIUM (PORCINE) 1000 UNIT/ML DIALYSIS: 1000.0000 [IU] | INTRAMUSCULAR | Status: DC | PRN

## 2016-07-28 MED ORDER — SODIUM CHLORIDE 0.9 % IV SOLN: 100.0000 mL | INTRAVENOUS | Status: DC | PRN

## 2016-07-28 MED ORDER — CARVEDILOL 3.125 MG PO TABS
3.1250 mg | ORAL_TABLET | Freq: Two times a day (BID) | ORAL | Status: DC
Start: 2016-07-28 — End: 2016-07-30
  Administered 2016-07-28 – 2016-07-30 (×4): 3.125 mg via ORAL
  Filled 2016-07-28 (×4): qty 1

## 2016-07-28 MED ORDER — ALTEPLASE 2 MG IJ SOLR: 2.0000 mg | Freq: Once | INTRAMUSCULAR | Status: DC | PRN

## 2016-07-28 NOTE — Care Management Note (Signed)
Case Management Note  Patient Details  Name: Deborah Hendricks MRN: [id number] Date of Birth: 1946/05/14  Subjective/Objective:                    Action/Plan:   Expected Discharge Date:                  Expected Discharge Plan:  [place]  In-House Referral:     Discharge planning Services  CM Consult  Post Acute Care Choice:  Home Health Choice offered to:  Patient  DME Arranged:    DME Agency:  NA  HH Arranged:  Nurse's Aide [place] Agency:  [place]  Status of Service:  In process, will continue to follow  If discussed at Long Length of Stay Meetings, dates discussed:    Additional Comments: CM met with pt to discuss disposition as PT has recc SNF.  Pt states she is NOT going to SNF.  Pt states she lives with grandson, has 2 dogs and a cat and after dialysis today she will need ambulance transport home is discharged as her grandson who would usually pick her up will be at work.  Pt states she has a caretaker/aide daily from [place] and they are aware Alvis Lemmings aide visited pt in hospital).  Pt states she has keys to get into home (as her grandson needs to work).  Pt is scheduled for dialysis this afternoon.  CM will continue to to follow for transport home IF discharged this afternoon. Dellie Catholic, RN 07/28/2016, 2:22 PM

## 2016-07-28 NOTE — Procedures (Signed)
I have seen and examined this patient and agree with the plan of care   Hb 12.2  Ca 8.4  Phos 5     Deborah Hendricks W 07/28/2016, 11:57 AM

## 2016-07-28 NOTE — Progress Notes (Signed)
[facility] PROGRESS NOTE  Deborah Hendricks [id number] DOB: Mar 15, 1946 DOA: 07/26/2016   PCP: Rexene Agent, MD     Assessment/Plan: Principal Problem:   Visual changes Active Problems:   Type II diabetes mellitus with neurological manifestations ([place])   Essential hypertension   History of pulmonary embolism   History of stroke   ESRD on dialysis [facility])   Paroxysmal atrial fibrillation ([place])   Patient is Jehovah's Witness   Seizure disorder [facility])   70 y.o. 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 Patient was undergoing dialysis in the outpatient setting today and had completed about one half of her treatment when she was noticed to experienced abrupt onset change of mental status with facial drooping and slurred speech. Her blood pressure also dropped into the 70 systolic and quickly rebounded into the 180s.Patient was brought to the ER as a code stroke. Initial CT head unremarkable. Stat MRI MRA obtained. Patient's NIHSS was 4. She was not a candidate for TPA secondary to eliquis administration within 48 hours of presentation. She was evaluated by Dr. Leonel Ramsay with neurology. MRA did reveal severe stenosis of the left PCA versus occlusion and on exam patient was found by neurology to have right hemianopia and mild latency of speech. Neurology also felt that vascular intervention to the left PCA would not be in her favor since her symptoms were mild and she has severe vascular disease  Assessment and plan  Visual changes/ History of stroke -Patient presents with visual change, neurology feels  right hemianopia is a baseline findingfrom  old strokes She also came in with mental status change and transient worsening of baseline aphasia and right-sided weakness concerning for possible TIA versus stroke.Repeat MRI and EEG are negative -Could be related to transient hypotension/hypoperfusion of the brain -Appreciate neurology evaluation -Given known eliquis not TPA candidate and given mild symptomatology and severe vascular disease possible left PCA stenosis not appropriate for intervention per neurology -MRI could not exclude early lacunar infarcts on the right-for to neurology's interpretation of MRI films -Echo  showed EF of 65-70%, no cardiac source of emboli -Carotid duplex Right :  40-59% internal carotid artery stenosis. Left : 1% to 39% internal carotid artery stenosis -Hemoglobin A1c 6.3 and  LDL 85 Neurochecks stable -Continue preadmission aspirin 325 mg and eliquis per neurology -PT/OT/SLP -pending        ESRD on dialysis  -Completed half of today's dialysis -Nephrology aware of admission    Type II diabetes mellitus with neurological manifestations  -Hemoglobin A1c 6.3, hypoglycemic on admission on multiple occasions -Follow CBGs and provide SSI Discontinued Lantus,       Essential hypertension -Apparent episodic hypotension during dialysis now resolved  Resume Coreg at a lower dose    History of pulmonary embolism -Continue eliquis    Paroxysmal atrial fibrillation  -Continue eliquis -Patient currently maintaining first degree AV block with sinus etiology which was documented on 12-lead as well as bedside telemetry; also noted with episodes of very brief sinus pauses versus sinus arrhythmia Resume Coreg at a lower dose, no pauses noted on telemetry since admission -CHADVASc = 7      Patient is Jehovah's Witness -No blood products    ?? Seizure  disorder  EEG negative     PVD/post left AKA (Feb 2017) -Primarily wheelchair-bound secondary to ill fitting prosthesis      DVT  prophylaxsis eliquis   Code Status:  DO NOT RESUSCITATE    Family Communication: Discussed in detail with the patient, all imaging results, lab results explained to the patient   Disposition Plan: needs SNF      Consultants:  Neurology  Nephrology    Procedures:  None  Antibiotics: Anti-infectives    None         HPI/Subjective:  Patient continues to have stuttering speech, states that this has been her baseline since her second stroke  Objective: Vitals:   07/27/16 1209 07/27/16 1631 07/27/16 2039 07/28/16 0411  BP: (!) 145/64 (!) 198/71 (!) 167/72 (!) 166/67  Pulse: 66 75 72 70  Resp: 19 18 19 19   Temp: 98 F (36.7 C) 98.5 F (36.9 C) 98.2 F (36.8 C) 97.9 F (36.6 C)  TempSrc: Oral Oral Oral Oral  SpO2: 100% 99% 100% 99%  Weight:   70.3 kg (154 lb 15.7 oz)   Height:        Intake/Output Summary (Last 24 hours) at 07/28/16 0954 Last data filed at 07/28/16 [id number]  Gross per 24 hour  Intake              600 ml  Output              150 ml  Net              450 ml    Exam:  Examination:  General exam: Appears calm and comfortable  Respiratory system: Clear to auscultation. Respiratory effort normal. Cardiovascular system: S1 & S2 heard, RRR. No JVD, murmurs, rubs, gallops or clicks. No pedal edema. Gastrointestinal system: Abdomen is nondistended, soft and nontender. No organomegaly or masses felt. Normal bowel sounds heard. Central nervous system: Alert and oriented. No focal neurological deficits. Extremities: Symmetric 5 x 5 power. Skin: No rashes, lesions or ulcers Psychiatry: Judgement and insight appear normal. Mood & affect appropriate.     Data Reviewed: I have personally reviewed following labs and imaging studies  Micro Results Recent Results (from the past 240 hour(s))  MRSA PCR Screening     Status: None   Collection Time: 07/27/16  8:14 PM  Result Value Ref Range Status   MRSA by PCR NEGATIVE NEGATIVE Final    Comment:         The GeneXpert MRSA Assay (FDA approved for NASAL specimens only), is one component of a comprehensive MRSA colonization surveillance program. It is not intended to diagnose MRSA infection nor to guide or monitor treatment for MRSA infections.     Radiology Reports Mr Jodene Nam Head Wo Contrast  Addendum Date: 07/26/2016   ADDENDUM REPORT: 07/26/2016 15:13 ADDENDUM: Study discussed by telephone with Dr. Roland Rack on 07/26/2016 at 1507 hours. Electronically Signed   By: Genevie Ann M.D.   On: 07/26/2016 15:13   Result Date: 07/26/2016 CLINICAL DATA:  70 year old female code stroke. Acute onset slurred speech. Initial encounter. Limited MRI protocol requested by Neurology to include only axial and coronal diffusion. EXAM: MRI HEAD WITHOUT CONTRAST LIMITED MRA HEAD WITHOUT CONTRAST TECHNIQUE: Multiplanar, multiecho pulse sequences of the brain and surrounding structures were obtained without intravenous contrast. Angiographic images of the head were obtained using MRA technique without contrast. COMPARISON:  Head CT without contrast 1347 hours today. FINDINGS: MRI HEAD FINDINGS Heterogeneity along the posterior limbs of both  internal capsules, corresponding to heterogeneous hypodensity on the earlier CT. Facilitated diffusion in these areas indicating multiple chronic lacunar infarcts. Probable superimposed chronic microhemorrhage accounting for some of the trace diffusion heterogeneity. Overall it is difficult to exclude tiny areas of restricted diffusion along the right corona radiata (series 3, image 25) and perhaps also the left caudate (as seen on series 4, image 17). No restricted diffusion elsewhere. No intracranial mass effect. No ventriculomegaly. MRA HEAD FINDINGS Antegrade flow in the posterior circulation with codominant distal vertebral arteries. Mild distal vertebral irregularity without stenosis. Right PICA is patent. Patent vertebrobasilar junction and bilateral AICA origins. No basilar  stenosis. Normal SCA and PCA origins. Mild motion artifact at the level of the posterior communicating arteries, the left appears to be present. The right could be diminutive or absent. Motion artifact also appears to exaggerate signal loss in the distal left PCA which appears better preserved on source images. No proximal PCA occlusion. Antegrade flow in both ICA siphons. Moderate cavernous and proximal supraclinoid siphon irregularity. There probably is an atherosclerotic pseudo lesion of the left cavernous carotid directed inferiorly and laterally on series 5, image 83 (and series 503, image 8). Both carotid termini are patent. MCA and ACA origins are patent. Motion artifact affecting the M1 and A1 vessels which appear to remain normal. Bilateral MCA bifurcations are patent. Motion artifact continues into the second order vessels. No M2 occlusion identified. Visualized ACA branches appear within normal limits. IMPRESSION: 1. Negative for emergent large vessel occlusion. 2. Intermittently motion degraded intracranial MRA. Moderate to severe stenosis of the distal left PCA which may be exaggerated due to motion artifact. Extensive bilateral ICA siphon atherosclerosis with up to moderate siphon stenosis. Probable left cavernous segment atherosclerotic pseudo lesion (vs. a small 2 mm cavernous segment aneurysm). 3. Diffusion weighted imaging is heterogeneous in the posterior deep gray matter suggesting the presence of chronic small vessel ischemia with microhemorrhage. 4. Difficult to exclude small acute lacunar infarcts of the right corona radiata, and left caudate. Electronically Signed: By: Genevie Ann M.D. On: 07/26/2016 15:05   Mr Brain Wo Contrast  Result Date: 07/26/2016 CLINICAL DATA:  Sudden onset speech difficulty. EXAM: MRI HEAD WITHOUT CONTRAST TECHNIQUE: Multiplanar, multiecho pulse sequences of the brain and surrounding structures were obtained without intravenous contrast. COMPARISON:  MRA of the brain  07/26/2016. FINDINGS: Brain: Areas of diffusion heterogeneity are again seen along the posterior limbs of the internal capsules without definite focal diffusion restriction. The midline structures are normal. There is confluent periventricular hyperintense T2 weighted signal compatible with chronic microvascular ischemia. There are multiple bilateral basal ganglia and thalamus lacunar infarcts that are chronic. No mass lesion or midline shift. No hydrocephalus or extra-axial fluid collection. Vascular: Major intracranial flow voids are preserved. Bilateral thalamic chronic microhemorrhage. No evidence of amyloid angiopathy. Skull and upper cervical spine: The visualized skull base, calvarium, upper cervical spine and extracranial soft tissues are normal. Sinuses/Orbits: No fluid levels or advanced mucosal thickening. No mastoid effusion. Normal orbits. IMPRESSION: 1. No acute infarct. Areas of diffusion heterogeneity along the posterior limbs of the internal capsule are likely artifactual and may be secondary to susceptibility effects from nearby thalamocapsular hemosiderin deposition from chronic microhemorrhage. 2. Chronic microvascular ischemia and multiple old lacunar infarcts. Electronically Signed   By: Ulyses Jarred M.D.   On: 07/26/2016 22:45   Mr Brain Ltd W/o Cm  Addendum Date: 07/26/2016   ADDENDUM REPORT: 07/26/2016 15:13 ADDENDUM: Study discussed by telephone with Dr. Roland Rack on 07/26/2016  at 1507 hours. Electronically Signed   By: Genevie Ann M.D.   On: 07/26/2016 15:13   Result Date: 07/26/2016 CLINICAL DATA:  70 year old female code stroke. Acute onset slurred speech. Initial encounter. Limited MRI protocol requested by Neurology to include only axial and coronal diffusion. EXAM: MRI HEAD WITHOUT CONTRAST LIMITED MRA HEAD WITHOUT CONTRAST TECHNIQUE: Multiplanar, multiecho pulse sequences of the brain and surrounding structures were obtained without intravenous contrast. Angiographic  images of the head were obtained using MRA technique without contrast. COMPARISON:  Head CT without contrast 1347 hours today. FINDINGS: MRI HEAD FINDINGS Heterogeneity along the posterior limbs of both internal capsules, corresponding to heterogeneous hypodensity on the earlier CT. Facilitated diffusion in these areas indicating multiple chronic lacunar infarcts. Probable superimposed chronic microhemorrhage accounting for some of the trace diffusion heterogeneity. Overall it is difficult to exclude tiny areas of restricted diffusion along the right corona radiata (series 3, image 25) and perhaps also the left caudate (as seen on series 4, image 17). No restricted diffusion elsewhere. No intracranial mass effect. No ventriculomegaly. MRA HEAD FINDINGS Antegrade flow in the posterior circulation with codominant distal vertebral arteries. Mild distal vertebral irregularity without stenosis. Right PICA is patent. Patent vertebrobasilar junction and bilateral AICA origins. No basilar stenosis. Normal SCA and PCA origins. Mild motion artifact at the level of the posterior communicating arteries, the left appears to be present. The right could be diminutive or absent. Motion artifact also appears to exaggerate signal loss in the distal left PCA which appears better preserved on source images. No proximal PCA occlusion. Antegrade flow in both ICA siphons. Moderate cavernous and proximal supraclinoid siphon irregularity. There probably is an atherosclerotic pseudo lesion of the left cavernous carotid directed inferiorly and laterally on series 5, image 83 (and series 503, image 8). Both carotid termini are patent. MCA and ACA origins are patent. Motion artifact affecting the M1 and A1 vessels which appear to remain normal. Bilateral MCA bifurcations are patent. Motion artifact continues into the second order vessels. No M2 occlusion identified. Visualized ACA branches appear within normal limits. IMPRESSION: 1. Negative  for emergent large vessel occlusion. 2. Intermittently motion degraded intracranial MRA. Moderate to severe stenosis of the distal left PCA which may be exaggerated due to motion artifact. Extensive bilateral ICA siphon atherosclerosis with up to moderate siphon stenosis. Probable left cavernous segment atherosclerotic pseudo lesion (vs. a small 2 mm cavernous segment aneurysm). 3. Diffusion weighted imaging is heterogeneous in the posterior deep gray matter suggesting the presence of chronic small vessel ischemia with microhemorrhage. 4. Difficult to exclude small acute lacunar infarcts of the right corona radiata, and left caudate. Electronically Signed: By: Genevie Ann M.D. On: 07/26/2016 15:05   Ct Head Code Stroke Wo Contrast`  Result Date: 07/26/2016 CLINICAL DATA:  Code stroke. Difficulty speaking, change in mental status. EXAM: CT HEAD WITHOUT CONTRAST TECHNIQUE: Contiguous axial images were obtained from the base of the skull through the vertex without intravenous contrast. COMPARISON:  None. FINDINGS: No evidence for acute hemorrhage, mass lesion, hydrocephalus, or extra-axial fluid. Mild atrophy. Moderate hypoattenuation of white matter suggesting chronic microvascular ischemic change. BILATERAL lacunar infarcts affect the posterior limb internal capsule on the RIGHT and thalamus on the LEFT. Asymmetric hypodensity of the LEFT internal capsule/ centrum semiovale (coronal image 36 series 203, axial image 14-15 series 201) could represent acute infarction. No visible cortical hypodensities. Advanced vascular calcification in the carotid siphons and supraclinoid internal carotid artery segments bilaterally. No hyperdense ICA terminus or M1 MCA.  The calvarium is intact.  No sinus or mastoid disease. ASPECTS [facility] Stroke Program Early CT Score) - Ganglionic level infarction (caudate, lentiform nuclei, internal capsule, insula, M1-M3 cortex): 6 - Supraganglionic infarction (M4-M6 cortex): 3 Total score (0-10  with 10 being normal): 9 IMPRESSION: 1. Chronic changes as described. Cannot exclude early infarct in the LEFT internal capsule/centrum semiovale. 2. ASPECTS is 9 Text message of the above findings sent to the on-call neurologist with instructions to call back at 2:05 p.m. Electronically Signed   By: Staci Righter M.D.   On: 07/26/2016 14:07     CBC  Recent Labs Lab 07/26/16 1347 07/26/16 1356  WBC 6.2  --   HGB 11.1* 12.2  HCT 35.4* 36.0  PLT 166  --   MCV 96.5  --   MCH 30.2  --   MCHC 31.4  --   RDW 16.3*  --   LYMPHSABS 1.3  --   MONOABS 0.8  --   EOSABS 0.4  --   BASOSABS 0.0  --     Chemistries   Recent Labs Lab 07/26/16 1347 07/26/16 1356  NA 138 139  K 3.9 3.8  CL 103 100*  CO2 26  --   GLUCOSE 156* 153*  BUN 42* 42*  CREATININE 3.28* 3.30*  CALCIUM 8.5*  --   AST 30  --   ALT 37  --   ALKPHOS 230*  --   BILITOT 0.5  --    ------------------------------------------------------------------------------------------------------------------ estimated creatinine clearance is 14.6 mL/min (by C-G formula based on SCr of 3.3 mg/dL (H)). ------------------------------------------------------------------------------------------------------------------  Recent Labs  07/27/16 0547  HGBA1C 6.3*   ------------------------------------------------------------------------------------------------------------------  Recent Labs  07/27/16 0547  CHOL 161  HDL 66  LDLCALC 85  TRIG 52  CHOLHDL 2.4   ------------------------------------------------------------------------------------------------------------------ No results for input(s): TSH, T4TOTAL, T3FREE, THYROIDAB in the last 72 hours.  Invalid input(s): FREET3 ------------------------------------------------------------------------------------------------------------------ No results for input(s): VITAMINB12, FOLATE, FERRITIN, TIBC, IRON, RETICCTPCT in the last 72 hours.  Coagulation profile  Recent  Labs Lab 07/26/16 1347  INR 1.22    No results for input(s): DDIMER in the last 72 hours.  Cardiac Enzymes No results for input(s): CKMB, TROPONINI, MYOGLOBIN in the last 168 hours.  Invalid input(s): CK ------------------------------------------------------------------------------------------------------------------ Invalid input(s): POCBNP   CBG:  Recent Labs Lab 07/27/16 2038 07/27/16 2350 07/28/16 0431 07/28/16 0749 07/28/16 0917  GLUCAP 101* 118* 98 91 88       Studies: Mr Jodene Nam Head Wo Contrast  Addendum Date: 07/26/2016   ADDENDUM REPORT: 07/26/2016 15:13 ADDENDUM: Study discussed by telephone with Dr. Roland Rack on 07/26/2016 at 1507 hours. Electronically Signed   By: Genevie Ann M.D.   On: 07/26/2016 15:13   Result Date: 07/26/2016 CLINICAL DATA:  70 year old female code stroke. Acute onset slurred speech. Initial encounter. Limited MRI protocol requested by Neurology to include only axial and coronal diffusion. EXAM: MRI HEAD WITHOUT CONTRAST LIMITED MRA HEAD WITHOUT CONTRAST TECHNIQUE: Multiplanar, multiecho pulse sequences of the brain and surrounding structures were obtained without intravenous contrast. Angiographic images of the head were obtained using MRA technique without contrast. COMPARISON:  Head CT without contrast 1347 hours today. FINDINGS: MRI HEAD FINDINGS Heterogeneity along the posterior limbs of both internal capsules, corresponding to heterogeneous hypodensity on the earlier CT. Facilitated diffusion in these areas indicating multiple chronic lacunar infarcts. Probable superimposed chronic microhemorrhage accounting for some of the trace diffusion heterogeneity. Overall it is difficult to exclude tiny areas of restricted diffusion along the right corona  radiata (series 3, image 25) and perhaps also the left caudate (as seen on series 4, image 17). No restricted diffusion elsewhere. No intracranial mass effect. No ventriculomegaly. MRA HEAD FINDINGS  Antegrade flow in the posterior circulation with codominant distal vertebral arteries. Mild distal vertebral irregularity without stenosis. Right PICA is patent. Patent vertebrobasilar junction and bilateral AICA origins. No basilar stenosis. Normal SCA and PCA origins. Mild motion artifact at the level of the posterior communicating arteries, the left appears to be present. The right could be diminutive or absent. Motion artifact also appears to exaggerate signal loss in the distal left PCA which appears better preserved on source images. No proximal PCA occlusion. Antegrade flow in both ICA siphons. Moderate cavernous and proximal supraclinoid siphon irregularity. There probably is an atherosclerotic pseudo lesion of the left cavernous carotid directed inferiorly and laterally on series 5, image 83 (and series 503, image 8). Both carotid termini are patent. MCA and ACA origins are patent. Motion artifact affecting the M1 and A1 vessels which appear to remain normal. Bilateral MCA bifurcations are patent. Motion artifact continues into the second order vessels. No M2 occlusion identified. Visualized ACA branches appear within normal limits. IMPRESSION: 1. Negative for emergent large vessel occlusion. 2. Intermittently motion degraded intracranial MRA. Moderate to severe stenosis of the distal left PCA which may be exaggerated due to motion artifact. Extensive bilateral ICA siphon atherosclerosis with up to moderate siphon stenosis. Probable left cavernous segment atherosclerotic pseudo lesion (vs. a small 2 mm cavernous segment aneurysm). 3. Diffusion weighted imaging is heterogeneous in the posterior deep gray matter suggesting the presence of chronic small vessel ischemia with microhemorrhage. 4. Difficult to exclude small acute lacunar infarcts of the right corona radiata, and left caudate. Electronically Signed: By: Genevie Ann M.D. On: 07/26/2016 15:05   Mr Brain Wo Contrast  Result Date: 07/26/2016 CLINICAL  DATA:  Sudden onset speech difficulty. EXAM: MRI HEAD WITHOUT CONTRAST TECHNIQUE: Multiplanar, multiecho pulse sequences of the brain and surrounding structures were obtained without intravenous contrast. COMPARISON:  MRA of the brain 07/26/2016. FINDINGS: Brain: Areas of diffusion heterogeneity are again seen along the posterior limbs of the internal capsules without definite focal diffusion restriction. The midline structures are normal. There is confluent periventricular hyperintense T2 weighted signal compatible with chronic microvascular ischemia. There are multiple bilateral basal ganglia and thalamus lacunar infarcts that are chronic. No mass lesion or midline shift. No hydrocephalus or extra-axial fluid collection. Vascular: Major intracranial flow voids are preserved. Bilateral thalamic chronic microhemorrhage. No evidence of amyloid angiopathy. Skull and upper cervical spine: The visualized skull base, calvarium, upper cervical spine and extracranial soft tissues are normal. Sinuses/Orbits: No fluid levels or advanced mucosal thickening. No mastoid effusion. Normal orbits. IMPRESSION: 1. No acute infarct. Areas of diffusion heterogeneity along the posterior limbs of the internal capsule are likely artifactual and may be secondary to susceptibility effects from nearby thalamocapsular hemosiderin deposition from chronic microhemorrhage. 2. Chronic microvascular ischemia and multiple old lacunar infarcts. Electronically Signed   By: Ulyses Jarred M.D.   On: 07/26/2016 22:45   Mr Brain Ltd W/o Cm  Addendum Date: 07/26/2016   ADDENDUM REPORT: 07/26/2016 15:13 ADDENDUM: Study discussed by telephone with Dr. Roland Rack on 07/26/2016 at 1507 hours. Electronically Signed   By: Genevie Ann M.D.   On: 07/26/2016 15:13   Result Date: 07/26/2016 CLINICAL DATA:  70 year old female code stroke. Acute onset slurred speech. Initial encounter. Limited MRI protocol requested by Neurology to include only axial and  coronal diffusion. EXAM: MRI HEAD WITHOUT CONTRAST LIMITED MRA HEAD WITHOUT CONTRAST TECHNIQUE: Multiplanar, multiecho pulse sequences of the brain and surrounding structures were obtained without intravenous contrast. Angiographic images of the head were obtained using MRA technique without contrast. COMPARISON:  Head CT without contrast 1347 hours today. FINDINGS: MRI HEAD FINDINGS Heterogeneity along the posterior limbs of both internal capsules, corresponding to heterogeneous hypodensity on the earlier CT. Facilitated diffusion in these areas indicating multiple chronic lacunar infarcts. Probable superimposed chronic microhemorrhage accounting for some of the trace diffusion heterogeneity. Overall it is difficult to exclude tiny areas of restricted diffusion along the right corona radiata (series 3, image 25) and perhaps also the left caudate (as seen on series 4, image 17). No restricted diffusion elsewhere. No intracranial mass effect. No ventriculomegaly. MRA HEAD FINDINGS Antegrade flow in the posterior circulation with codominant distal vertebral arteries. Mild distal vertebral irregularity without stenosis. Right PICA is patent. Patent vertebrobasilar junction and bilateral AICA origins. No basilar stenosis. Normal SCA and PCA origins. Mild motion artifact at the level of the posterior communicating arteries, the left appears to be present. The right could be diminutive or absent. Motion artifact also appears to exaggerate signal loss in the distal left PCA which appears better preserved on source images. No proximal PCA occlusion. Antegrade flow in both ICA siphons. Moderate cavernous and proximal supraclinoid siphon irregularity. There probably is an atherosclerotic pseudo lesion of the left cavernous carotid directed inferiorly and laterally on series 5, image 83 (and series 503, image 8). Both carotid termini are patent. MCA and ACA origins are patent. Motion artifact affecting the M1 and A1 vessels  which appear to remain normal. Bilateral MCA bifurcations are patent. Motion artifact continues into the second order vessels. No M2 occlusion identified. Visualized ACA branches appear within normal limits. IMPRESSION: 1. Negative for emergent large vessel occlusion. 2. Intermittently motion degraded intracranial MRA. Moderate to severe stenosis of the distal left PCA which may be exaggerated due to motion artifact. Extensive bilateral ICA siphon atherosclerosis with up to moderate siphon stenosis. Probable left cavernous segment atherosclerotic pseudo lesion (vs. a small 2 mm cavernous segment aneurysm). 3. Diffusion weighted imaging is heterogeneous in the posterior deep gray matter suggesting the presence of chronic small vessel ischemia with microhemorrhage. 4. Difficult to exclude small acute lacunar infarcts of the right corona radiata, and left caudate. Electronically Signed: By: Genevie Ann M.D. On: 07/26/2016 15:05   Ct Head Code Stroke Wo Contrast`  Result Date: 07/26/2016 CLINICAL DATA:  Code stroke. Difficulty speaking, change in mental status. EXAM: CT HEAD WITHOUT CONTRAST TECHNIQUE: Contiguous axial images were obtained from the base of the skull through the vertex without intravenous contrast. COMPARISON:  None. FINDINGS: No evidence for acute hemorrhage, mass lesion, hydrocephalus, or extra-axial fluid. Mild atrophy. Moderate hypoattenuation of white matter suggesting chronic microvascular ischemic change. BILATERAL lacunar infarcts affect the posterior limb internal capsule on the RIGHT and thalamus on the LEFT. Asymmetric hypodensity of the LEFT internal capsule/ centrum semiovale (coronal image 36 series 203, axial image 14-15 series 201) could represent acute infarction. No visible cortical hypodensities. Advanced vascular calcification in the carotid siphons and supraclinoid internal carotid artery segments bilaterally. No hyperdense ICA terminus or M1 MCA. The calvarium is intact.  No sinus  or mastoid disease. ASPECTS [facility] Stroke Program Early CT Score) - Ganglionic level infarction (caudate, lentiform nuclei, internal capsule, insula, M1-M3 cortex): 6 - Supraganglionic infarction (M4-M6 cortex): 3 Total score (0-10 with 10 being normal): 9 IMPRESSION: 1.  Chronic changes as described. Cannot exclude early infarct in the LEFT internal capsule/centrum semiovale. 2. ASPECTS is 9 Text message of the above findings sent to the on-call neurologist with instructions to call back at 2:05 p.m. Electronically Signed   By: Staci Righter M.D.   On: 07/26/2016 14:07      Lab Results  Component Value Date   HGBA1C 6.3 (H) 07/27/2016   Lab Results  Component Value Date   LDLCALC 85 07/27/2016   CREATININE 3.30 (H) 07/26/2016       Scheduled Meds: .  stroke: mapping our early stages of recovery book   Does not apply Once  . apixaban  5 mg Oral QHS  . aspirin EC  325 mg Oral Daily  . calcium acetate  667 mg Oral TID WC  . doxercalciferol  4 mcg Intravenous Q T,Th,Sa-HD  . gabapentin  100 mg Oral TID  . insulin aspart  0-9 Units Subcutaneous Q4H  . multivitamin  1 tablet Oral QHS   Continuous Infusions:     LOS: 0 days    Time spent: >30 MINS    [facility]  [facility] Pager [phone]. If 7PM-7AM, please contact night-coverage at [email or web address], password [email or web address] 07/28/2016, 9:54 AM  LOS: 0 days

## 2016-07-28 NOTE — Evaluation (Signed)
Occupational Therapy Evaluation Patient Details Name: Deborah Hendricks MRN: [id number] DOB: 01-20-1946 Today's Date: 07/28/2016    History of Present Illness 70 y.o. female admitted for AMS, weakness, slurred speech, and R visual field loss. MRI (-) and EEG positive for L temporal lobe abnormal activity. PMH significant for CVA, ESRD, PAF, PE, DM, seizures, L AKA.   Clinical Impression   PTA, pt was independent with ADLs and required supervision for squat-pivot transfers from w/c to other surfaces as well as sliding board transfers into/out of the car. Pt currently presents with bilateral peripheral visual field loss (R>L), RLE weakness, and resulting balance deficits. Pt plans to d/c home with intermittent assistance from her grandson and daughter - will need to confirm if they can provide closer to 24/7 supervision. Currently recommending HHOT at discharge. Will continue to follow acutely.    Follow Up Recommendations  Home health OT;Supervision/Assistance - 24 hour    Equipment Recommendations  None recommended by OT    Recommendations for Other Services       Precautions / Restrictions Precautions Precautions: Fall Restrictions Weight Bearing Restrictions: No      Mobility Bed Mobility Overal bed mobility: Modified Independent Bed Mobility: Supine to Sit     Supine to sit: Mod assist Sit to supine: Mod assist   General bed mobility comments: HOB slightly elevated (20 degrees) and use of bedrails. Increased time and effort, but no physical asssistance needed.  Transfers Overall transfer level: Needs assistance Equipment used: None Transfers: Squat Pivot Transfers     Squat pivot transfers: Min guard     General transfer comment: Min guard for safety. No physical assistance required or cues for safety.    Balance Overall balance assessment: Needs assistance Sitting-balance support: No upper extremity supported;Feet supported Sitting balance-Leahy Scale: Good      Standing balance support: Bilateral upper extremity supported;During functional activity Standing balance-Leahy Scale: Poor Standing balance comment: Reliant on BUE support to maintain safety during squat-pivot transfers. Pt reports her R leg very weak and does not stand much at all                             ADL Overall ADL's : Needs assistance/impaired Eating/Feeding: Set up;Sitting   Grooming: Wash/dry hands;Wash/dry face;Set up;Sitting   Upper Body Bathing: Set up;Sitting   Lower Body Bathing: Set up;Sitting/lateral leans   Upper Body Dressing : Set up;Sitting   Lower Body Dressing: Set up;Sitting/lateral leans   Toilet Transfer: Min guard;Squat-pivot;BSC   Toileting- Clothing Manipulation and Hygiene: Min guard;Sitting/lateral lean       Functional mobility during ADLs: Min guard       Vision Vision Assessment?: Yes Eye Alignment: Within Functional Limits Ocular Range of Motion: Within Functional Limits Alignment/Gaze Preference: Within Defined Limits Tracking/Visual Pursuits: Decreased smoothness of horizontal tracking;Decreased smoothness of vertical tracking Saccades: Within functional limits Convergence: Within functional limits Visual Fields: Other (comment) (Bilateral peripheral visual field loss (R>L)) Additional Comments: Pt unable to detect objects in peripheral field until they reach her central vision. Central vision intact. R side is worse than the L. Pt reports that her peripheral visual fields are blurry. Educated pt on scanning environment to compensate for peripheral vision loss and encouraged her to see an ophthalmologist after discharge.   Perception Perception Perception Tested?: Yes Perception Deficits: Inattention/neglect Inattention/Neglect: Appears intact   Praxis Praxis Praxis tested?: Within functional limits    Pertinent Vitals/Pain Pain Assessment: No/denies pain  Hand Dominance Left   Extremity/Trunk Assessment  Upper Extremity Assessment Upper Extremity Assessment: Overall WFL for tasks assessed (reports numbness/tingling in both hands)   Lower Extremity Assessment Lower Extremity Assessment: RLE deficits/detail;LLE deficits/detail RLE Deficits / Details: generalized weakness; overall all muscle groups 3+/5 LLE Deficits / Details: L AKA   Cervical / Trunk Assessment Cervical / Trunk Assessment: Kyphotic   Communication Communication Communication: No difficulties (Minor stuttering at times)   Cognition Arousal/Alertness: Awake/alert Behavior During Therapy: WFL for tasks assessed/performed Overall Cognitive Status: Within Functional Limits for tasks assessed (memory loss since CVA per chart - none observed this session)       Memory: Decreased short-term memory             General Comments       Exercises Exercises: Other exercises (Pt has 45 deg sit and 30 deg supine flex contrac R knee)     Shoulder Instructions      Home Living Family/patient expects to be discharged to:: Private residence Living Arrangements: Children;Other relatives;Non-relatives/Friends Available Help at Discharge: Family;Available PRN/intermittently Type of Home: House Home Access: Ramped entrance     Home Layout: One level         Bathroom Toilet: Standard     Home Equipment: Walker - 2 wheels;Bedside commode;Wheelchair - manual;Hospital bed;Other (comment) (sliding board)   Additional Comments: Pt's daughter works out of town for weeks or months at a time and pt's grandson works evenings.       Prior Functioning/Environment Level of Independence: Needs assistance  Gait / Transfers Assistance Needed: Pt reports being able to transfer from w/c to bed/BSC/chair independently and uses sliding board for car transfers ADL's / Homemaking Assistance Needed: Pt sponge bathes at sink and gets dressed independently. Grandson and daughter assist with IADLs and provide transportation.             OT Problem List: Decreased strength;Decreased activity tolerance;Impaired balance (sitting and/or standing);Decreased cognition;Decreased safety awareness;Decreased knowledge of use of DME or AE;Impaired sensation;Obesity;Impaired vision/perception   OT Treatment/Interventions: Self-care/ADL training;Therapeutic exercise;DME and/or AE instruction;Therapeutic activities;Visual/perceptual remediation/compensation;Patient/family education;Balance training;Cognitive remediation/compensation    OT Goals(Current goals can be found in the care plan section) Acute Rehab OT Goals Patient Stated Goal: "for my peripheral vision to return" OT Goal Formulation: With patient Time For Goal Achievement: 08/11/16 Potential to Achieve Goals: Good ADL Goals Pt Will Perform Upper Body Dressing: with modified independence;sitting Pt Will Perform Lower Body Dressing: with modified independence;sitting/lateral leans Pt Will Transfer to Toilet: with modified independence;squat pivot transfer;with transfer board;bedside commode Pt Will Perform Toileting - Clothing Manipulation and hygiene: with modified independence;sitting/lateral leans Additional ADL Goal #1: Pt will complete eye muscle strengthening exercises with min verbal cues.   OT Frequency: Min 2X/week   Barriers to D/C: Decreased caregiver support  Unsure if pt has 24/7 supervision at home       Co-evaluation              End of Session Equipment Utilized During Treatment: Gait belt Nurse Communication: Mobility status  Activity Tolerance: Patient tolerated treatment well Patient left: in chair;with call bell/phone within reach   Time: 1140-1212 OT Time Calculation (min): 32 min Charges:  OT General Charges $OT Visit: 1 Procedure OT Evaluation $OT Eval Moderate Complexity: 1 Procedure OT Treatments $Self Care/Home Management : 8-22 mins G-Codes: OT G-codes **NOT FOR INPATIENT CLASS** Functional Assessment Tool Used: clinical  judgement Functional Limitation: Self care Self Care Current Status [id number]): At least 1 percent but less than  20 percent impaired, limited or restricted Self Care Goal Status [id number]): At least 1 percent but less than 20 percent impaired, limited or restricted  Redmond Baseman, OTR/L Pager: [phone] 07/28/2016, 1:13 PM

## 2016-07-28 NOTE — Evaluation (Addendum)
Physical Therapy Evaluation Patient Details Name: Deborah Hendricks MRN: [id number] DOB: [birth year] Today's Date: 07/28/2016   History of Present Illness  70 yo female referred to PT after admission for weakness and R visual field loss.  MRI negative and EEG positive for L temporal lobe abnormal activity.  MD feels the vision and EEG may be chronic stroke findings.  PMHx:  CVA, ESRD, PAF, PE, DM, seizures  Clinical Impression  Pt was seen for evaluation of mobility with some restrictions due to her plan for HD today.  Will be appropriate for SNF due to her limited ability to help get to bedside, and has R thigh HD cath that presents a risk if she is too uncontrolled in her movements.  Will follow acutely for increasing strength and ROM to RLE, to stand with prosthesis if available and to transition to SNF with hopefully a shorter stay due to preparation from hospital level.    Follow Up Recommendations SNF    Equipment Recommendations  None recommended by PT    Recommendations for Other Services Rehab consult     Precautions / Restrictions Precautions Precautions: Fall (telemetry, nursing asked PT to avoid use of RLE in standing) Restrictions Weight Bearing Restrictions: No      Mobility  Bed Mobility Overal bed mobility: Needs Assistance Bed Mobility: Supine to Sit;Sit to Supine     Supine to sit: Mod assist Sit to supine: Mod assist   General bed mobility comments: Pt needed help to lift trunk and to move legs, esp to be careful with R thigh HD catheter  Transfers Overall transfer level: Needs assistance               General transfer comment: Pt did not transfer with PT at request of nursing  Ambulation/Gait             General Gait Details: has prosthesis at home  Stairs            Wheelchair Mobility    Modified Rankin (Stroke Patients Only)       Balance Overall balance assessment: Needs assistance Sitting-balance support: Bilateral upper  extremity supported Sitting balance-Leahy Scale: Fair                                       Pertinent Vitals/Pain Pain Assessment: No/denies pain    Home Living Family/patient expects to be discharged to:: Skilled nursing facility Living Arrangements: Children;Other relatives;Non-relatives/Friends Available Help at Discharge: Family;Personal care attendant;Available 24 hours/day Type of Home: House         Home Equipment: Wheelchair - [employer] - 2 wheels;Bedside commode      Prior Function Level of Independence: Needs assistance   Gait / Transfers Assistance Needed: transfers bed to chair to chair with assist from caregivers  ADL's / Homemaking Assistance Needed: lives with family who provide meals and housework        Hand Dominance        Extremity/Trunk Assessment   Upper Extremity Assessment: Generalized weakness           Lower Extremity Assessment: Generalized weakness      Cervical / Trunk Assessment: Normal  Communication      Cognition Arousal/Alertness: Awake/alert Behavior During Therapy: WFL for tasks assessed/performed Overall Cognitive Status: History of cognitive impairments - at baseline       Memory: Decreased short-term memory;Decreased recall of precautions  General Comments General comments (skin integrity, edema, etc.): Pt was not able to stand with limits of visit from nursing but did need substantial help to sit up and get back to bed, PLOF was pivot from wc to commode with supervision at home    Exercises     Assessment/Plan    PT Assessment Patient needs continued PT services  PT Problem List Decreased strength;Decreased range of motion;Decreased activity tolerance;Decreased balance;Decreased mobility;Decreased coordination;Decreased cognition;Decreased knowledge of use of DME;Decreased safety awareness;Cardiopulmonary status limiting activity          PT Treatment Interventions DME  instruction;Functional mobility training;Therapeutic activities;Therapeutic exercise;Balance training;Neuromuscular re-education;Patient/family education    PT Goals (Current goals can be found in the Care Plan section)  Acute Rehab PT Goals Patient Stated Goal: to get home ASAP PT Goal Formulation: With patient Time For Goal Achievement: 08/11/16 Potential to Achieve Goals: Good    Frequency Min 2X/week   Barriers to discharge Decreased caregiver support (one person assist at a time)      Co-evaluation               End of Session   Activity Tolerance: Patient tolerated treatment well;Patient limited by fatigue Patient left: in bed;with call bell/phone within reach;with bed alarm set Nurse Communication: Mobility status    Functional Assessment Tool Used: clinical judgment Functional Limitation: Changing and maintaining body position Changing and Maintaining Body Position Current Status [id number]): At least 40 percent but less than 60 percent impaired, limited or restricted Changing and Maintaining Body Position Goal Status [id number]): At least 1 percent but less than 20 percent impaired, limited or restricted    Time: 1021-1046 PT Time Calculation (min) (ACUTE ONLY): 25 min   Charges:   PT Evaluation $PT Eval Low Complexity: 1 Procedure PT Treatments $Therapeutic Activity: 8-22 mins   PT G Codes:   PT G-Codes **NOT FOR INPATIENT CLASS** Functional Assessment Tool Used: clinical judgment Functional Limitation: Changing and maintaining body position Changing and Maintaining Body Position Current Status [id number]): At least 40 percent but less than 60 percent impaired, limited or restricted Changing and Maintaining Body Position Goal Status [id number]): At least 1 percent but less than 20 percent impaired, limited or restricted    Ramond Dial 07/28/2016, 12:34 PM    Mee Hives, PT MS Acute Rehab Dept. Number: [place] [facility] [place]

## 2016-07-28 NOTE — Care Management Obs Status (Signed)
[facility] NOTIFICATION   Patient Details  Name: Deborah Hendricks MRN: [id number] Date of Birth: 02-May-1946   Medicare Observation Status Notification Given:  Yes    Dellie Catholic, RN 07/28/2016, 2:31 PM

## 2016-07-29 ENCOUNTER — Observation Stay (HOSPITAL_COMMUNITY): Payer: Medicare Other

## 2016-07-29 DIAGNOSIS — I509 Heart failure, unspecified: Secondary | ICD-10-CM | POA: Diagnosis not present

## 2016-07-29 DIAGNOSIS — I120 Hypertensive chronic kidney disease with stage 5 chronic kidney disease or end stage renal disease: Secondary | ICD-10-CM | POA: Diagnosis not present

## 2016-07-29 DIAGNOSIS — D631 Anemia in chronic kidney disease: Secondary | ICD-10-CM | POA: Diagnosis not present

## 2016-07-29 DIAGNOSIS — H539 Unspecified visual disturbance: Secondary | ICD-10-CM | POA: Diagnosis not present

## 2016-07-29 DIAGNOSIS — I10 Essential (primary) hypertension: Secondary | ICD-10-CM | POA: Diagnosis not present

## 2016-07-29 DIAGNOSIS — N186 End stage renal disease: Secondary | ICD-10-CM | POA: Diagnosis not present

## 2016-07-29 DIAGNOSIS — R079 Chest pain, unspecified: Secondary | ICD-10-CM | POA: Diagnosis not present

## 2016-07-29 DIAGNOSIS — Z992 Dependence on renal dialysis: Secondary | ICD-10-CM | POA: Diagnosis not present

## 2016-07-29 DIAGNOSIS — I132 Hypertensive heart and chronic kidney disease with heart failure and with stage 5 chronic kidney disease, or end stage renal disease: Secondary | ICD-10-CM | POA: Diagnosis not present

## 2016-07-29 DIAGNOSIS — N2581 Secondary hyperparathyroidism of renal origin: Secondary | ICD-10-CM | POA: Diagnosis not present

## 2016-07-29 DIAGNOSIS — R4182 Altered mental status, unspecified: Secondary | ICD-10-CM | POA: Diagnosis not present

## 2016-07-29 DIAGNOSIS — R05 Cough: Secondary | ICD-10-CM | POA: Diagnosis not present

## 2016-07-29 DIAGNOSIS — R531 Weakness: Secondary | ICD-10-CM | POA: Diagnosis not present

## 2016-07-29 DIAGNOSIS — Z86711 Personal history of pulmonary embolism: Secondary | ICD-10-CM | POA: Diagnosis not present

## 2016-07-29 DIAGNOSIS — I639 Cerebral infarction, unspecified: Secondary | ICD-10-CM | POA: Diagnosis not present

## 2016-07-29 LAB — COMPREHENSIVE METABOLIC PANEL
ALBUMIN: 2.8 g/dL — AB (ref 3.5–5.0)
ALT: 41 U/L (ref 14–54)
ANION GAP: 6 (ref 5–15)
AST: 32 U/L (ref 15–41)
Alkaline Phosphatase: 211 U/L — ABNORMAL HIGH (ref 38–126)
BUN: 33 mg/dL — AB (ref 6–20)
CHLORIDE: 99 mmol/L — AB (ref 101–111)
CO2: 26 mmol/L (ref 22–32)
Calcium: 8.8 mg/dL — ABNORMAL LOW (ref 8.9–10.3)
Creatinine, Ser: 3.78 mg/dL — ABNORMAL HIGH (ref 0.44–1.00)
GFR calc Af Amer: 13 mL/min — ABNORMAL LOW (ref >60)
GFR calc non Af Amer: 11 mL/min — ABNORMAL LOW (ref >60)
GLUCOSE: 195 mg/dL — AB (ref 65–99)
POTASSIUM: 4.7 mmol/L (ref 3.5–5.1)
SODIUM: 131 mmol/L — AB (ref 135–145)
TOTAL PROTEIN: 6 g/dL — AB (ref 6.5–8.1)
Total Bilirubin: 0.2 mg/dL — ABNORMAL LOW (ref 0.3–1.2)

## 2016-07-29 LAB — CBC
HEMATOCRIT: 35.1 % — AB (ref 36.0–46.0)
HEMOGLOBIN: 11 g/dL — AB (ref 12.0–15.0)
MCH: 29.8 pg (ref 26.0–34.0)
MCHC: 31.3 g/dL (ref 30.0–36.0)
MCV: 95.1 fL (ref 78.0–100.0)
Platelets: 153 10*3/uL (ref 150–400)
RBC: 3.69 MIL/uL — ABNORMAL LOW (ref 3.87–5.11)
RDW: 15.7 % — AB (ref 11.5–15.5)
WBC: 5.3 10*3/uL (ref 4.0–10.5)

## 2016-07-29 LAB — TROPONIN I
TROPONIN I: 0.07 ng/mL — AB (ref <0.03)
Troponin I: 0.08 ng/mL (ref <0.03)
Troponin I: 0.09 ng/mL (ref <0.03)

## 2016-07-29 LAB — GLUCOSE, CAPILLARY
GLUCOSE-CAPILLARY: 78 mg/dL (ref 65–99)
GLUCOSE-CAPILLARY: 78 mg/dL (ref 65–99)
Glucose-Capillary: 183 mg/dL — ABNORMAL HIGH (ref 65–99)
Glucose-Capillary: 122 mg/dL — ABNORMAL HIGH (ref 65–99)
Glucose-Capillary: 193 mg/dL — ABNORMAL HIGH (ref 65–99)

## 2016-07-29 MED ORDER — PANTOPRAZOLE SODIUM 40 MG PO TBEC
40.0000 mg | DELAYED_RELEASE_TABLET | Freq: Every day | ORAL | Status: DC
  Administered 2016-07-29 – 2016-07-30 (×2): 40 mg via ORAL
  Filled 2016-07-29 (×2): qty 1

## 2016-07-29 MED ORDER — INFLUENZA VAC SPLIT QUAD 0.5 ML IM SUSY
0.5000 mL | PREFILLED_SYRINGE | Freq: Once | INTRAMUSCULAR | Status: DC
  Filled 2016-07-29: qty 0.5

## 2016-07-29 NOTE — Progress Notes (Signed)
At approximately 0005, patient complained of sharp substernal chest pain with no radiation.  She rated the pain as a 10/10.  The pain last approximately 15 minutes.  VS remained stable and NSR on telemetry.  An EKG was done.  Patient was placed on O2 at 2 LPM via [place].  [facility] was called and made aware.  Received orders which were implemented.  Patient had no additional chest pain during the night.  The troponin of 0.08 was called to MD.  Will continue to monitor patient.  Earleen Reaper RN-BC, [employer]

## 2016-07-29 NOTE — Progress Notes (Addendum)
[facility] PROGRESS NOTE  Deborah Hendricks [id number] DOB: 1946-06-17 DOA: 07/26/2016   PCP: Rexene Agent, MD     Assessment/Plan: Principal Problem:   Visual changes Active Problems:   Type II diabetes mellitus with neurological manifestations ([place])   Essential hypertension   History of pulmonary embolism   History of stroke   ESRD on dialysis [facility])   Paroxysmal atrial fibrillation ([place])   Patient is Jehovah's Witness   Seizure disorder [facility])   70 y.o. 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 Patient was undergoing dialysis in the outpatient setting today and had completed about one half of her treatment when she was noticed to experienced abrupt onset change of mental status with facial drooping and slurred speech. Her blood pressure also dropped into the 70 systolic and quickly rebounded into the 180s.Patient was brought to the ER as a code stroke. Initial CT head unremarkable. Stat MRI MRA obtained. Patient's NIHSS was 4. She was not a candidate for TPA secondary to eliquis administration within 48 hours of presentation. She was evaluated by Dr. Leonel Ramsay with neurology. MRA did reveal severe stenosis of the left PCA versus occlusion and on exam patient was found by neurology to have right hemianopia and mild latency of speech. Neurology also felt that vascular intervention to the left PCA would not be in her favor since her symptoms were mild and she has severe vascular disease  Assessment and plan  Visual changes/ History of stroke -Patient presents with visual change, neurology feels  right hemianopia is a baseline finding from  old strokes She also came in with mental status change and transient worsening of baseline aphasia and right-sided weakness concerning for possible TIA versus stroke.Repeat MRI and EEG are negative -Could be related to transient hypotension/hypoperfusion of the brain -Appreciate neurology evaluation -Given known eliquis not TPA candidate and given mild symptomatology and severe vascular disease possible left PCA stenosis not appropriate for intervention per neurology -MRI could not exclude early lacunar infarcts on the right-for to neurology's interpretation of MRI films -Echo  showed EF of 65-70%, no cardiac source of emboli -Carotid duplex Right :  40-59% internal carotid artery stenosis. Left : 1% to 39% internal carotid artery stenosis -Hemoglobin A1c 6.3 and  LDL 85 Neurochecks stable -Continue preadmission aspirin 325 mg and eliquis per neurology -PT/OT/SLP  -recommended SNF, regular diet  repeat MRI given worsening blurry vision    Chest pain-atypical EKG unchanged and troponins are flat Likely esophageal spasm-start PPI      ESRD on dialysis  -Completed half of today's dialysis -Nephrology aware of admission    Type II diabetes mellitus with neurological manifestations  -Hemoglobin A1c 6.3, hypoglycemic on admission on multiple occasions -Follow CBGs and provide SSI Discontinued Lantus,       Essential hypertension -Apparent episodic hypotension during dialysis now resolved  Resume Coreg at a lower dose Troponins are flat and unchanged, 2-D echo does not show any wall motion abnormalities Chest pain this morning, EKG unchanged from 9/15     History of pulmonary embolism -Continue eliquis    Paroxysmal atrial fibrillation  -Continue eliquis -Patient currently maintaining first degree AV block with sinus etiology which was documented on 12-lead as well  as bedside telemetry; also noted with episodes of very brief sinus pauses versus sinus arrhythmia Resume Coreg at  a lower dose, no pauses noted on telemetry since admission -CHADVASc = 7      Patient is Jehovah's Witness -No blood products    ?? Seizure disorder  EEG negative     PVD/post left AKA (Feb 2017) -Primarily wheelchair-bound secondary to ill fitting prosthesis      DVT prophylaxsis eliquis   Code Status:  DO NOT RESUSCITATE    Family Communication: Discussed in detail with the patient, all imaging results, lab results explained to the patient   Disposition Plan: needs SNF, likely discharge on Monday      Consultants:  Neurology  Nephrology    Procedures:  None  Antibiotics: Anti-infectives    None         HPI/Subjective:  Patient complained of sharp substernal chest pain at around 6:30 AM without radiation, EKG reviewed and unchanged, now complaing of blurry vision   Objective: Vitals:   07/28/16 2000 07/28/16 2015 07/28/16 2032 07/29/16 0422  BP: 131/66 (!) 163/82 (!) 150/63 (!) 146/56  Pulse: 72 73 74 66  Resp: 18 18 18 18   Temp:   98.5 F (36.9 C) 98.6 F (37 C)  TempSrc:   Oral Oral  SpO2:   100% 100%  Weight:  69.4 kg (153 lb) 69.4 kg (153 lb)   Height:        Intake/Output Summary (Last 24 hours) at 07/29/16 1031 Last data filed at 07/29/16 0600  Gross per 24 hour  Intake              480 ml  Output             1000 ml  Net             -520 ml    Exam:  Examination:  General exam: Appears calm and comfortable  Respiratory system: Clear to auscultation. Respiratory effort normal. Cardiovascular system: S1 & S2 heard, RRR. No JVD, murmurs, rubs, gallops or clicks. No pedal edema. Gastrointestinal system: Abdomen is nondistended, soft and nontender. No organomegaly or masses felt. Normal bowel sounds heard. Central nervous system: Alert and oriented. No focal neurological deficits. Extremities: Symmetric 5 x 5 power. Skin: No rashes, lesions or ulcers Psychiatry: Judgement and insight appear normal. Mood & affect  appropriate.     Data Reviewed: I have personally reviewed following labs and imaging studies  Micro Results Recent Results (from the past 240 hour(s))  MRSA PCR Screening     Status: None   Collection Time: 07/27/16  8:14 PM  Result Value Ref Range Status   MRSA by PCR NEGATIVE NEGATIVE Final    Comment:        The GeneXpert MRSA Assay (FDA approved for NASAL specimens only), is one component of a comprehensive MRSA colonization surveillance program. It is not intended to diagnose MRSA infection nor to guide or monitor treatment for MRSA infections.     Radiology Reports Mr Jodene Nam Head Wo Contrast  Addendum Date: 07/26/2016   ADDENDUM REPORT: 07/26/2016 15:13 ADDENDUM: Study discussed by telephone with Dr. Roland Rack on 07/26/2016 at 1507 hours. Electronically Signed   By: Genevie Ann M.D.   On: 07/26/2016 15:13   Result Date: 07/26/2016 CLINICAL DATA:  70 year old female code stroke. Acute onset slurred speech. Initial encounter. Limited MRI protocol requested by Neurology to include only axial and coronal diffusion. EXAM: MRI HEAD WITHOUT CONTRAST LIMITED MRA HEAD  WITHOUT CONTRAST TECHNIQUE: Multiplanar, multiecho pulse sequences of the brain and surrounding structures were obtained without intravenous contrast. Angiographic images of the head were obtained using MRA technique without contrast. COMPARISON:  Head CT without contrast 1347 hours today. FINDINGS: MRI HEAD FINDINGS Heterogeneity along the posterior limbs of both internal capsules, corresponding to heterogeneous hypodensity on the earlier CT. Facilitated diffusion in these areas indicating multiple chronic lacunar infarcts. Probable superimposed chronic microhemorrhage accounting for some of the trace diffusion heterogeneity. Overall it is difficult to exclude tiny areas of restricted diffusion along the right corona radiata (series 3, image 25) and perhaps also the left caudate (as seen on series 4, image 17). No  restricted diffusion elsewhere. No intracranial mass effect. No ventriculomegaly. MRA HEAD FINDINGS Antegrade flow in the posterior circulation with codominant distal vertebral arteries. Mild distal vertebral irregularity without stenosis. Right PICA is patent. Patent vertebrobasilar junction and bilateral AICA origins. No basilar stenosis. Normal SCA and PCA origins. Mild motion artifact at the level of the posterior communicating arteries, the left appears to be present. The right could be diminutive or absent. Motion artifact also appears to exaggerate signal loss in the distal left PCA which appears better preserved on source images. No proximal PCA occlusion. Antegrade flow in both ICA siphons. Moderate cavernous and proximal supraclinoid siphon irregularity. There probably is an atherosclerotic pseudo lesion of the left cavernous carotid directed inferiorly and laterally on series 5, image 83 (and series 503, image 8). Both carotid termini are patent. MCA and ACA origins are patent. Motion artifact affecting the M1 and A1 vessels which appear to remain normal. Bilateral MCA bifurcations are patent. Motion artifact continues into the second order vessels. No M2 occlusion identified. Visualized ACA branches appear within normal limits. IMPRESSION: 1. Negative for emergent large vessel occlusion. 2. Intermittently motion degraded intracranial MRA. Moderate to severe stenosis of the distal left PCA which may be exaggerated due to motion artifact. Extensive bilateral ICA siphon atherosclerosis with up to moderate siphon stenosis. Probable left cavernous segment atherosclerotic pseudo lesion (vs. a small 2 mm cavernous segment aneurysm). 3. Diffusion weighted imaging is heterogeneous in the posterior deep gray matter suggesting the presence of chronic small vessel ischemia with microhemorrhage. 4. Difficult to exclude small acute lacunar infarcts of the right corona radiata, and left caudate. Electronically Signed:  By: Genevie Ann M.D. On: 07/26/2016 15:05   Mr Brain Wo Contrast  Result Date: 07/26/2016 CLINICAL DATA:  Sudden onset speech difficulty. EXAM: MRI HEAD WITHOUT CONTRAST TECHNIQUE: Multiplanar, multiecho pulse sequences of the brain and surrounding structures were obtained without intravenous contrast. COMPARISON:  MRA of the brain 07/26/2016. FINDINGS: Brain: Areas of diffusion heterogeneity are again seen along the posterior limbs of the internal capsules without definite focal diffusion restriction. The midline structures are normal. There is confluent periventricular hyperintense T2 weighted signal compatible with chronic microvascular ischemia. There are multiple bilateral basal ganglia and thalamus lacunar infarcts that are chronic. No mass lesion or midline shift. No hydrocephalus or extra-axial fluid collection. Vascular: Major intracranial flow voids are preserved. Bilateral thalamic chronic microhemorrhage. No evidence of amyloid angiopathy. Skull and upper cervical spine: The visualized skull base, calvarium, upper cervical spine and extracranial soft tissues are normal. Sinuses/Orbits: No fluid levels or advanced mucosal thickening. No mastoid effusion. Normal orbits. IMPRESSION: 1. No acute infarct. Areas of diffusion heterogeneity along the posterior limbs of the internal capsule are likely artifactual and may be secondary to susceptibility effects from nearby thalamocapsular hemosiderin deposition from chronic microhemorrhage. 2.  Chronic microvascular ischemia and multiple old lacunar infarcts. Electronically Signed   By: Ulyses Jarred M.D.   On: 07/26/2016 22:45   Mr Brain Ltd W/o Cm  Addendum Date: 07/26/2016   ADDENDUM REPORT: 07/26/2016 15:13 ADDENDUM: Study discussed by telephone with Dr. Roland Rack on 07/26/2016 at 1507 hours. Electronically Signed   By: Genevie Ann M.D.   On: 07/26/2016 15:13   Result Date: 07/26/2016 CLINICAL DATA:  70 year old female code stroke. Acute onset slurred  speech. Initial encounter. Limited MRI protocol requested by Neurology to include only axial and coronal diffusion. EXAM: MRI HEAD WITHOUT CONTRAST LIMITED MRA HEAD WITHOUT CONTRAST TECHNIQUE: Multiplanar, multiecho pulse sequences of the brain and surrounding structures were obtained without intravenous contrast. Angiographic images of the head were obtained using MRA technique without contrast. COMPARISON:  Head CT without contrast 1347 hours today. FINDINGS: MRI HEAD FINDINGS Heterogeneity along the posterior limbs of both internal capsules, corresponding to heterogeneous hypodensity on the earlier CT. Facilitated diffusion in these areas indicating multiple chronic lacunar infarcts. Probable superimposed chronic microhemorrhage accounting for some of the trace diffusion heterogeneity. Overall it is difficult to exclude tiny areas of restricted diffusion along the right corona radiata (series 3, image 25) and perhaps also the left caudate (as seen on series 4, image 17). No restricted diffusion elsewhere. No intracranial mass effect. No ventriculomegaly. MRA HEAD FINDINGS Antegrade flow in the posterior circulation with codominant distal vertebral arteries. Mild distal vertebral irregularity without stenosis. Right PICA is patent. Patent vertebrobasilar junction and bilateral AICA origins. No basilar stenosis. Normal SCA and PCA origins. Mild motion artifact at the level of the posterior communicating arteries, the left appears to be present. The right could be diminutive or absent. Motion artifact also appears to exaggerate signal loss in the distal left PCA which appears better preserved on source images. No proximal PCA occlusion. Antegrade flow in both ICA siphons. Moderate cavernous and proximal supraclinoid siphon irregularity. There probably is an atherosclerotic pseudo lesion of the left cavernous carotid directed inferiorly and laterally on series 5, image 83 (and series 503, image 8). Both carotid  termini are patent. MCA and ACA origins are patent. Motion artifact affecting the M1 and A1 vessels which appear to remain normal. Bilateral MCA bifurcations are patent. Motion artifact continues into the second order vessels. No M2 occlusion identified. Visualized ACA branches appear within normal limits. IMPRESSION: 1. Negative for emergent large vessel occlusion. 2. Intermittently motion degraded intracranial MRA. Moderate to severe stenosis of the distal left PCA which may be exaggerated due to motion artifact. Extensive bilateral ICA siphon atherosclerosis with up to moderate siphon stenosis. Probable left cavernous segment atherosclerotic pseudo lesion (vs. a small 2 mm cavernous segment aneurysm). 3. Diffusion weighted imaging is heterogeneous in the posterior deep gray matter suggesting the presence of chronic small vessel ischemia with microhemorrhage. 4. Difficult to exclude small acute lacunar infarcts of the right corona radiata, and left caudate. Electronically Signed: By: Genevie Ann M.D. On: 07/26/2016 15:05   Ct Head Code Stroke Wo Contrast`  Result Date: 07/26/2016 CLINICAL DATA:  Code stroke. Difficulty speaking, change in mental status. EXAM: CT HEAD WITHOUT CONTRAST TECHNIQUE: Contiguous axial images were obtained from the base of the skull through the vertex without intravenous contrast. COMPARISON:  None. FINDINGS: No evidence for acute hemorrhage, mass lesion, hydrocephalus, or extra-axial fluid. Mild atrophy. Moderate hypoattenuation of white matter suggesting chronic microvascular ischemic change. BILATERAL lacunar infarcts affect the posterior limb internal capsule on the RIGHT and thalamus on  the LEFT. Asymmetric hypodensity of the LEFT internal capsule/ centrum semiovale (coronal image 36 series 203, axial image 14-15 series 201) could represent acute infarction. No visible cortical hypodensities. Advanced vascular calcification in the carotid siphons and supraclinoid internal carotid  artery segments bilaterally. No hyperdense ICA terminus or M1 MCA. The calvarium is intact.  No sinus or mastoid disease. ASPECTS [facility] Stroke Program Early CT Score) - Ganglionic level infarction (caudate, lentiform nuclei, internal capsule, insula, M1-M3 cortex): 6 - Supraganglionic infarction (M4-M6 cortex): 3 Total score (0-10 with 10 being normal): 9 IMPRESSION: 1. Chronic changes as described. Cannot exclude early infarct in the LEFT internal capsule/centrum semiovale. 2. ASPECTS is 9 Text message of the above findings sent to the on-call neurologist with instructions to call back at 2:05 p.m. Electronically Signed   By: Staci Righter M.D.   On: 07/26/2016 14:07     CBC  Recent Labs Lab 07/26/16 1347 07/26/16 1356  WBC 6.2  --   HGB 11.1* 12.2  HCT 35.4* 36.0  PLT 166  --   MCV 96.5  --   MCH 30.2  --   MCHC 31.4  --   RDW 16.3*  --   LYMPHSABS 1.3  --   MONOABS 0.8  --   EOSABS 0.4  --   BASOSABS 0.0  --     Chemistries   Recent Labs Lab 07/26/16 1347 07/26/16 1356  NA 138 139  K 3.9 3.8  CL 103 100*  CO2 26  --   GLUCOSE 156* 153*  BUN 42* 42*  CREATININE 3.28* 3.30*  CALCIUM 8.5*  --   AST 30  --   ALT 37  --   ALKPHOS 230*  --   BILITOT 0.5  --    ------------------------------------------------------------------------------------------------------------------ estimated creatinine clearance is 14.6 mL/min (by C-G formula based on SCr of 3.3 mg/dL (H)). ------------------------------------------------------------------------------------------------------------------  Recent Labs  07/27/16 0547  HGBA1C 6.3*   ------------------------------------------------------------------------------------------------------------------  Recent Labs  07/27/16 0547  CHOL 161  HDL 66  LDLCALC 85  TRIG 52  CHOLHDL 2.4   ------------------------------------------------------------------------------------------------------------------ No results for input(s):  TSH, T4TOTAL, T3FREE, THYROIDAB in the last 72 hours.  Invalid input(s): FREET3 ------------------------------------------------------------------------------------------------------------------ No results for input(s): VITAMINB12, FOLATE, FERRITIN, TIBC, IRON, RETICCTPCT in the last 72 hours.  Coagulation profile  Recent Labs Lab 07/26/16 1347  INR 1.22    No results for input(s): DDIMER in the last 72 hours.  Cardiac Enzymes  Recent Labs Lab 07/29/16 0051 07/29/16 0519  TROPONINI 0.08* 0.09*   ------------------------------------------------------------------------------------------------------------------ Invalid input(s): POCBNP   CBG:  Recent Labs Lab 07/28/16 1153 07/28/16 2044 07/28/16 2359 07/29/16 0412 07/29/16 0757  GLUCAP 188* 169* 168* 78 78       Studies: No results found.    Lab Results  Component Value Date   HGBA1C 6.3 (H) 07/27/2016   Lab Results  Component Value Date   LDLCALC 85 07/27/2016   CREATININE 3.30 (H) 07/26/2016       Scheduled Meds: .  stroke: mapping our early stages of recovery book   Does not apply Once  . apixaban  5 mg Oral QHS  . aspirin EC  325 mg Oral Daily  . calcium acetate  667 mg Oral TID WC  . carvedilol  3.125 mg Oral BID WC  . doxercalciferol  4 mcg Intravenous Q T,Th,Sa-HD  . gabapentin  100 mg Oral TID  . Influenza vac split quadrivalent PF  0.5 mL Intramuscular Once  . insulin aspart  0-9 Units Subcutaneous Q4H  . multivitamin  1 tablet Oral QHS   Continuous Infusions:     LOS: 0 days    Time spent: >30 MINS    [facility]  [facility] Pager [phone]. If 7PM-7AM, please contact night-coverage at [email or web address], password [facility] 07/29/2016, 10:31 AM  LOS: 0 days

## 2016-07-30 DIAGNOSIS — I639 Cerebral infarction, unspecified: Secondary | ICD-10-CM | POA: Diagnosis not present

## 2016-07-30 DIAGNOSIS — H538 Other visual disturbances: Secondary | ICD-10-CM

## 2016-07-30 DIAGNOSIS — N186 End stage renal disease: Secondary | ICD-10-CM | POA: Diagnosis not present

## 2016-07-30 DIAGNOSIS — H539 Unspecified visual disturbance: Secondary | ICD-10-CM | POA: Diagnosis not present

## 2016-07-30 DIAGNOSIS — I10 Essential (primary) hypertension: Secondary | ICD-10-CM | POA: Diagnosis not present

## 2016-07-30 LAB — GLUCOSE, CAPILLARY
GLUCOSE-CAPILLARY: 118 mg/dL — AB (ref 65–99)
GLUCOSE-CAPILLARY: 89 mg/dL (ref 65–99)
Glucose-Capillary: 124 mg/dL — ABNORMAL HIGH (ref 65–99)
Glucose-Capillary: 175 mg/dL — ABNORMAL HIGH (ref 65–99)

## 2016-07-30 MED ORDER — CARVEDILOL 3.125 MG PO TABS: 3.1250 mg | ORAL_TABLET | Freq: Two times a day (BID) | ORAL | 1 refills | Status: DC

## 2016-07-30 MED ORDER — PANTOPRAZOLE SODIUM 40 MG PO TBEC: 40.0000 mg | DELAYED_RELEASE_TABLET | Freq: Every day | ORAL | 1 refills | Status: DC

## 2016-07-30 MED ORDER — ASPIRIN EC 81 MG PO TBEC: 81.0000 mg | DELAYED_RELEASE_TABLET | Freq: Every day | ORAL | 1 refills | Status: DC

## 2016-07-30 NOTE — Progress Notes (Signed)
Called patient's daughter, Donella Stade, to help facilitate the patient's discharge.  All other phone number's in chart are not in service.  Also attempted to call the patient's grand-daughter in [place].  Jillyn Ledger, MBA, BSN, RN

## 2016-07-30 NOTE — Progress Notes (Signed)
Spoke with the patient's family members.  One of the family members will pick the patient up and get there patient's from outpatient dialysis today.  Jillyn Ledger, MBA, BSN, RN

## 2016-07-30 NOTE — Discharge Summary (Signed)
Physician Discharge Summary  Deborah Hendricks MRN: [id number] DOB/AGE: 1946-02-08 70 y.o.  PCP: Rexene Agent, MD   Admit date: 07/26/2016 Discharge date: 07/30/2016  Discharge Diagnoses:    Principal Problem:   Visual changes Active Problems:   Type II diabetes mellitus with neurological manifestations ([place])   Essential hypertension   History of pulmonary embolism   History of stroke   ESRD on dialysis ([place])   Paroxysmal atrial fibrillation ([facility])   Patient is Jehovah's Witness   Seizure disorder ([place])   Blurry vision    Follow-up recommendations Follow-up with PCP in 3-5 days , including all  additional recommended appointments as below Follow-up CBC, CMP in 3-5 days Dose of Coreg reduced because of pauses on telemetry Dose of aspirin reduced on 325 mg to 81 mg a day as the patient is also on ELIQUIS  Patient is being discharged home with home health      Current Discharge Medication List    START taking these medications   Details  pantoprazole (PROTONIX) 40 MG tablet Take 1 tablet (40 mg total) by mouth daily. Qty: 30 tablet, Refills: 1      CONTINUE these medications which have CHANGED   Details  aspirin EC 81 MG tablet Take 1 tablet (81 mg total) by mouth daily. Qty: 60 tablet, Refills: 1    carvedilol (COREG) 3.125 MG tablet Take 1 tablet (3.125 mg total) by mouth 2 (two) times daily with a meal. Qty: 60 tablet, Refills: 1      CONTINUE these medications which have NOT CHANGED   Details  apixaban (ELIQUIS) 5 MG TABS tablet Take 5 mg by mouth daily.    calcium acetate (PHOSLO) 667 MG capsule Take 667 mg by mouth 3 (three) times daily with meals.    gabapentin (NEURONTIN) 100 MG capsule Take 100 mg by mouth 3 (three) times daily.    insulin aspart (NOVOLOG) 100 UNIT/ML injection Inject 2-10 Units into the skin 3 (three) times daily before meals. 151-200=2 units, 201-250=4 units, 251-300=6 units, 301-350=8 units, 351-400=10 units, >400=10 units and Call MD     multivitamin (RENA-VIT) TABS tablet Take 1 tablet by mouth daily.    Nutritional Supplements (FEEDING SUPPLEMENT, NEPRO CARB STEADY,) LIQD Take 237 mLs by mouth 2 (two) times daily between meals.    ondansetron (ZOFRAN) 4 MG tablet Take 4 mg by mouth every 8 (eight) hours as needed for nausea or vomiting.      STOP taking these medications     insulin detemir (LEVEMIR) 100 UNIT/ML injection      doxycycline (VIBRA-TABS) 100 MG tablet          Discharge Condition: Overall prognosis is guarded Discharge Instructions Get Medicines reviewed and adjusted: Please take all your medications with you for your next visit with your Primary MD  Please request your Primary MD to go over all hospital tests and procedure/radiological results at the follow up, please ask your Primary MD to get all Hospital records sent to his/her office.  If you experience worsening of your admission symptoms, develop shortness of breath, life threatening emergency, suicidal or homicidal thoughts you must seek medical attention immediately by calling 911 or calling your MD immediately if symptoms less severe.  You must read complete instructions/literature along with all the possible adverse reactions/side effects for all the Medicines you take and that have been prescribed to you. Take any new Medicines after you have completely understood and accpet all the possible adverse reactions/side effects.   Do  not drive when taking Pain medications.   Do not take more than prescribed Pain, Sleep and Anxiety Medications  Special Instructions: If you have smoked or chewed Tobacco in the last 2 yrs please stop smoking, stop any regular Alcohol and or any Recreational drug use.  Wear Seat belts while driving.  Please note  You were cared for by a hospitalist during your hospital stay. Once you are discharged, your primary care physician will handle any further medical issues. Please note that NO REFILLS for any  discharge medications will be authorized once you are discharged, as it is imperative that you return to your primary care physician (or establish a relationship with a primary care physician if you do not have one) for your aftercare needs so that they can reassess your need for medications and monitor your lab values.     Allergies  Allergen Reactions  . Penicillins Hives, Itching and Rash    Has patient had a PCN reaction causing immediate rash, facial/tongue/throat swelling, SOB or lightheadedness with hypotension: Yes Has patient had a PCN reaction causing severe rash involving mucus membranes or skin necrosis:  Has patient had a PCN reaction that required hospitalization  Has patient had a PCN reaction occurring within the last 10 years:  If all of the above answers are "NO", then may proceed with Cephalosporin use.  [name] Amoxicillin Other (See Comments)    Unspecified Per MAR  . Latex Other (See Comments)    Unspecified Per MAR  . Sulfa Antibiotics Other (See Comments)    Unspecified Per MAR      Disposition: Home with home health   Consults:  Neurology Nephrology     Significant Diagnostic Studies:  Dg Chest 2 View  Result Date: 07/29/2016 CLINICAL DATA:  Midsternal chest pain beginning yesterday with nonproductive cough. EXAM: CHEST  2 VIEW COMPARISON:  12/20/2015 FINDINGS: Venous catheter from below has tip over the right atrium slightly higher compared the prior study. Lungs are adequately inflated without consolidation or effusion. Mild elevation of the right diaphragm which is more pronounced. Cardiomediastinal silhouette is within normal. There is calcified plaque over the thoracic aorta. Remainder the exam is unchanged. IMPRESSION: No active cardiopulmonary disease. Venous catheter from below with tip slightly higher compared to the prior exam located over the root region of the right atrium. Aortic atherosclerosis. Electronically Signed   By: Marin Olp M.D.   On:  07/29/2016 16:05   Mr Jodene Nam Head Wo Contrast  Addendum Date: 07/26/2016   ADDENDUM REPORT: 07/26/2016 15:13 ADDENDUM: Study discussed by telephone with Dr. Roland Rack on 07/26/2016 at 1507 hours. Electronically Signed   By: Genevie Ann M.D.   On: 07/26/2016 15:13   Result Date: 07/26/2016 CLINICAL DATA:  70 year old female code stroke. Acute onset slurred speech. Initial encounter. Limited MRI protocol requested by Neurology to include only axial and coronal diffusion. EXAM: MRI HEAD WITHOUT CONTRAST LIMITED MRA HEAD WITHOUT CONTRAST TECHNIQUE: Multiplanar, multiecho pulse sequences of the brain and surrounding structures were obtained without intravenous contrast. Angiographic images of the head were obtained using MRA technique without contrast. COMPARISON:  Head CT without contrast 1347 hours today. FINDINGS: MRI HEAD FINDINGS Heterogeneity along the posterior limbs of both internal capsules, corresponding to heterogeneous hypodensity on the earlier CT. Facilitated diffusion in these areas indicating multiple chronic lacunar infarcts. Probable superimposed chronic microhemorrhage accounting for some of the trace diffusion heterogeneity. Overall it is difficult to exclude tiny areas of restricted diffusion along the right corona radiata (series  3, image 25) and perhaps also the left caudate (as seen on series 4, image 17). No restricted diffusion elsewhere. No intracranial mass effect. No ventriculomegaly. MRA HEAD FINDINGS Antegrade flow in the posterior circulation with codominant distal vertebral arteries. Mild distal vertebral irregularity without stenosis. Right PICA is patent. Patent vertebrobasilar junction and bilateral AICA origins. No basilar stenosis. Normal SCA and PCA origins. Mild motion artifact at the level of the posterior communicating arteries, the left appears to be present. The right could be diminutive or absent. Motion artifact also appears to exaggerate signal loss in the distal  left PCA which appears better preserved on source images. No proximal PCA occlusion. Antegrade flow in both ICA siphons. Moderate cavernous and proximal supraclinoid siphon irregularity. There probably is an atherosclerotic pseudo lesion of the left cavernous carotid directed inferiorly and laterally on series 5, image 83 (and series 503, image 8). Both carotid termini are patent. MCA and ACA origins are patent. Motion artifact affecting the M1 and A1 vessels which appear to remain normal. Bilateral MCA bifurcations are patent. Motion artifact continues into the second order vessels. No M2 occlusion identified. Visualized ACA branches appear within normal limits. IMPRESSION: 1. Negative for emergent large vessel occlusion. 2. Intermittently motion degraded intracranial MRA. Moderate to severe stenosis of the distal left PCA which may be exaggerated due to motion artifact. Extensive bilateral ICA siphon atherosclerosis with up to moderate siphon stenosis. Probable left cavernous segment atherosclerotic pseudo lesion (vs. a small 2 mm cavernous segment aneurysm). 3. Diffusion weighted imaging is heterogeneous in the posterior deep gray matter suggesting the presence of chronic small vessel ischemia with microhemorrhage. 4. Difficult to exclude small acute lacunar infarcts of the right corona radiata, and left caudate. Electronically Signed: By: Genevie Ann M.D. On: 07/26/2016 15:05   Mr Brain Wo Contrast  Result Date: 07/29/2016 CLINICAL DATA:  Visual changes, mental status changes, aphasia, and RIGHT-sided weakness, RIGHT hemianopia. Patient on Eliquis. Stroke risk factors include hypertension, diabetes, and prior infarction. Subsequent encounter. EXAM: MRI HEAD WITHOUT CONTRAST TECHNIQUE: Multiplanar, multiecho pulse sequences of the brain and surrounding structures were obtained without intravenous contrast. COMPARISON:  Recent MR 07/26/2016. FINDINGS: The patient was unable to remain motionless for the exam. Small  or subtle lesions could be overlooked. Brain: No evidence for acute infarction, hemorrhage, mass lesion, hydrocephalus, or extra-axial fluid. Advanced atrophy. Extensive T2 and FLAIR hyperintensities throughout the white matter, consistent with chronic microvascular ischemic change. Vascular: Flow voids are maintained. Remote hemorrhagic lacunar infarcts of the BILATERAL thalami and posterior lentiform nucleus, stable. Skull and upper cervical spine: Normal marrow signal. Cervical spondylosis, not clearly compressive. Sinuses/Orbits: Negative. Other: None. IMPRESSION: Atrophy and small vessel disease. Widespread areas of chronic ischemia, but no acute infarction or visible acute hemorrhage. Stable exam from 07/26/2016 MR. Electronically Signed   By: Staci Righter M.D.   On: 07/29/2016 15:14   Mr Brain Wo Contrast  Result Date: 07/26/2016 CLINICAL DATA:  Sudden onset speech difficulty. EXAM: MRI HEAD WITHOUT CONTRAST TECHNIQUE: Multiplanar, multiecho pulse sequences of the brain and surrounding structures were obtained without intravenous contrast. COMPARISON:  MRA of the brain 07/26/2016. FINDINGS: Brain: Areas of diffusion heterogeneity are again seen along the posterior limbs of the internal capsules without definite focal diffusion restriction. The midline structures are normal. There is confluent periventricular hyperintense T2 weighted signal compatible with chronic microvascular ischemia. There are multiple bilateral basal ganglia and thalamus lacunar infarcts that are chronic. No mass lesion or midline shift. No hydrocephalus or extra-axial  fluid collection. Vascular: Major intracranial flow voids are preserved. Bilateral thalamic chronic microhemorrhage. No evidence of amyloid angiopathy. Skull and upper cervical spine: The visualized skull base, calvarium, upper cervical spine and extracranial soft tissues are normal. Sinuses/Orbits: No fluid levels or advanced mucosal thickening. No mastoid effusion.  Normal orbits. IMPRESSION: 1. No acute infarct. Areas of diffusion heterogeneity along the posterior limbs of the internal capsule are likely artifactual and may be secondary to susceptibility effects from nearby thalamocapsular hemosiderin deposition from chronic microhemorrhage. 2. Chronic microvascular ischemia and multiple old lacunar infarcts. Electronically Signed   By: Ulyses Jarred M.D.   On: 07/26/2016 22:45   Mr Brain Ltd W/o Cm  Addendum Date: 07/26/2016   ADDENDUM REPORT: 07/26/2016 15:13 ADDENDUM: Study discussed by telephone with Dr. Roland Rack on 07/26/2016 at 1507 hours. Electronically Signed   By: Genevie Ann M.D.   On: 07/26/2016 15:13   Result Date: 07/26/2016 CLINICAL DATA:  70 year old female code stroke. Acute onset slurred speech. Initial encounter. Limited MRI protocol requested by Neurology to include only axial and coronal diffusion. EXAM: MRI HEAD WITHOUT CONTRAST LIMITED MRA HEAD WITHOUT CONTRAST TECHNIQUE: Multiplanar, multiecho pulse sequences of the brain and surrounding structures were obtained without intravenous contrast. Angiographic images of the head were obtained using MRA technique without contrast. COMPARISON:  Head CT without contrast 1347 hours today. FINDINGS: MRI HEAD FINDINGS Heterogeneity along the posterior limbs of both internal capsules, corresponding to heterogeneous hypodensity on the earlier CT. Facilitated diffusion in these areas indicating multiple chronic lacunar infarcts. Probable superimposed chronic microhemorrhage accounting for some of the trace diffusion heterogeneity. Overall it is difficult to exclude tiny areas of restricted diffusion along the right corona radiata (series 3, image 25) and perhaps also the left caudate (as seen on series 4, image 17). No restricted diffusion elsewhere. No intracranial mass effect. No ventriculomegaly. MRA HEAD FINDINGS Antegrade flow in the posterior circulation with codominant distal vertebral arteries. Mild  distal vertebral irregularity without stenosis. Right PICA is patent. Patent vertebrobasilar junction and bilateral AICA origins. No basilar stenosis. Normal SCA and PCA origins. Mild motion artifact at the level of the posterior communicating arteries, the left appears to be present. The right could be diminutive or absent. Motion artifact also appears to exaggerate signal loss in the distal left PCA which appears better preserved on source images. No proximal PCA occlusion. Antegrade flow in both ICA siphons. Moderate cavernous and proximal supraclinoid siphon irregularity. There probably is an atherosclerotic pseudo lesion of the left cavernous carotid directed inferiorly and laterally on series 5, image 83 (and series 503, image 8). Both carotid termini are patent. MCA and ACA origins are patent. Motion artifact affecting the M1 and A1 vessels which appear to remain normal. Bilateral MCA bifurcations are patent. Motion artifact continues into the second order vessels. No M2 occlusion identified. Visualized ACA branches appear within normal limits. IMPRESSION: 1. Negative for emergent large vessel occlusion. 2. Intermittently motion degraded intracranial MRA. Moderate to severe stenosis of the distal left PCA which may be exaggerated due to motion artifact. Extensive bilateral ICA siphon atherosclerosis with up to moderate siphon stenosis. Probable left cavernous segment atherosclerotic pseudo lesion (vs. a small 2 mm cavernous segment aneurysm). 3. Diffusion weighted imaging is heterogeneous in the posterior deep gray matter suggesting the presence of chronic small vessel ischemia with microhemorrhage. 4. Difficult to exclude small acute lacunar infarcts of the right corona radiata, and left caudate. Electronically Signed: By: Genevie Ann M.D. On: 07/26/2016 15:05  Ct Head Code Stroke Wo Contrast`  Result Date: 07/26/2016 CLINICAL DATA:  Code stroke. Difficulty speaking, change in mental status. EXAM: CT HEAD  WITHOUT CONTRAST TECHNIQUE: Contiguous axial images were obtained from the base of the skull through the vertex without intravenous contrast. COMPARISON:  None. FINDINGS: No evidence for acute hemorrhage, mass lesion, hydrocephalus, or extra-axial fluid. Mild atrophy. Moderate hypoattenuation of white matter suggesting chronic microvascular ischemic change. BILATERAL lacunar infarcts affect the posterior limb internal capsule on the RIGHT and thalamus on the LEFT. Asymmetric hypodensity of the LEFT internal capsule/ centrum semiovale (coronal image 36 series 203, axial image 14-15 series 201) could represent acute infarction. No visible cortical hypodensities. Advanced vascular calcification in the carotid siphons and supraclinoid internal carotid artery segments bilaterally. No hyperdense ICA terminus or M1 MCA. The calvarium is intact.  No sinus or mastoid disease. ASPECTS [facility] Stroke Program Early CT Score) - Ganglionic level infarction (caudate, lentiform nuclei, internal capsule, insula, M1-M3 cortex): 6 - Supraganglionic infarction (M4-M6 cortex): 3 Total score (0-10 with 10 being normal): 9 IMPRESSION: 1. Chronic changes as described. Cannot exclude early infarct in the LEFT internal capsule/centrum semiovale. 2. ASPECTS is 9 Text message of the above findings sent to the on-call neurologist with instructions to call back at 2:05 p.m. Electronically Signed   By: Staci Righter M.D.   On: 07/26/2016 14:07        Filed Weights   07/28/16 2015 07/28/16 2032 07/29/16 2003  Weight: 69.4 kg (153 lb) 69.4 kg (153 lb) 69.6 kg (153 lb 7 oz)     Microbiology: Recent Results (from the past 240 hour(s))  MRSA PCR Screening     Status: None   Collection Time: 07/27/16  8:14 PM  Result Value Ref Range Status   MRSA by PCR NEGATIVE NEGATIVE Final    Comment:        The GeneXpert MRSA Assay (FDA approved for NASAL specimens only), is one component of a comprehensive MRSA colonization surveillance  program. It is not intended to diagnose MRSA infection nor to guide or monitor treatment for MRSA infections.        Blood Culture    Component Value Date/Time   SDES BLOOD LEFT HAND 12/18/2015 1200   SPECREQUEST BOTTLES DRAWN AEROBIC AND ANAEROBIC 10CCS 12/18/2015 1200   CULT NO GROWTH 5 DAYS 12/18/2015 1200   REPTSTATUS 12/23/2015 FINAL 12/18/2015 1200      Labs: Results for orders placed or performed during the hospital encounter of 07/26/16 (from the past 48 hour(s))  Glucose, capillary     Status: Abnormal   Collection Time: 07/28/16 11:53 AM  Result Value Ref Range   Glucose-Capillary 188 (H) 65 - 99 mg/dL  Glucose, capillary     Status: Abnormal   Collection Time: 07/28/16  8:44 PM  Result Value Ref Range   Glucose-Capillary 169 (H) 65 - 99 mg/dL  Glucose, capillary     Status: Abnormal   Collection Time: 07/28/16 11:59 PM  Result Value Ref Range   Glucose-Capillary 168 (H) 65 - 99 mg/dL  Troponin I (q 6hr x 3)     Status: Abnormal   Collection Time: 07/29/16 12:51 AM  Result Value Ref Range   Troponin I 0.08 (HH) <0.03 ng/mL    Comment: CRITICAL RESULT CALLED TO, READ BACK BY AND VERIFIED WITH: WICKER K,RN 07/29/16 0142 WAYK   Glucose, capillary     Status: None   Collection Time: 07/29/16  4:12 AM  Result Value Ref Range  Glucose-Capillary 78 65 - 99 mg/dL  Troponin I (q 6hr x 3)     Status: Abnormal   Collection Time: 07/29/16  5:19 AM  Result Value Ref Range   Troponin I 0.09 (HH) <0.03 ng/mL    Comment: CRITICAL VALUE NOTED.  VALUE IS CONSISTENT WITH PREVIOUSLY REPORTED AND CALLED VALUE.  Glucose, capillary     Status: None   Collection Time: 07/29/16  7:57 AM  Result Value Ref Range   Glucose-Capillary 78 65 - 99 mg/dL  Troponin I (q 6hr x 3)     Status: Abnormal   Collection Time: 07/29/16 11:10 AM  Result Value Ref Range   Troponin I 0.07 (HH) <0.03 ng/mL    Comment: CRITICAL VALUE NOTED.  VALUE IS CONSISTENT WITH PREVIOUSLY REPORTED AND  CALLED VALUE.  Comprehensive metabolic panel     Status: Abnormal   Collection Time: 07/29/16 11:10 AM  Result Value Ref Range   Sodium 131 (L) 135 - 145 mmol/L   Potassium 4.7 3.5 - 5.1 mmol/L   Chloride 99 (L) 101 - 111 mmol/L   CO2 26 22 - 32 mmol/L   Glucose, Bld 195 (H) 65 - 99 mg/dL   BUN 33 (H) 6 - 20 mg/dL   Creatinine, Ser 3.78 (H) 0.44 - 1.00 mg/dL   Calcium 8.8 (L) 8.9 - 10.3 mg/dL   Total Protein 6.0 (L) 6.5 - 8.1 g/dL   Albumin 2.8 (L) 3.5 - 5.0 g/dL   AST 32 15 - 41 U/L   ALT 41 14 - 54 U/L   Alkaline Phosphatase 211 (H) 38 - 126 U/L   Total Bilirubin 0.2 (L) 0.3 - 1.2 mg/dL   GFR calc non Af Amer 11 (L) >60 mL/min   GFR calc Af Amer 13 (L) >60 mL/min    Comment: (NOTE) The eGFR has been calculated using the CKD EPI equation. This calculation has not been validated in all clinical situations. eGFR's persistently <60 mL/min signify possible Chronic Kidney Disease.    Anion gap 6 5 - 15  CBC     Status: Abnormal   Collection Time: 07/29/16 11:10 AM  Result Value Ref Range   WBC 5.3 4.0 - 10.5 K/uL   RBC 3.69 (L) 3.87 - 5.11 MIL/uL   Hemoglobin 11.0 (L) 12.0 - 15.0 g/dL   HCT 35.1 (L) 36.0 - 46.0 %   MCV 95.1 78.0 - 100.0 fL   MCH 29.8 26.0 - 34.0 pg   MCHC 31.3 30.0 - 36.0 g/dL   RDW 15.7 (H) 11.5 - 15.5 %   Platelets 153 150 - 400 K/uL  Glucose, capillary     Status: Abnormal   Collection Time: 07/29/16 12:05 PM  Result Value Ref Range   Glucose-Capillary 183 (H) 65 - 99 mg/dL  Glucose, capillary     Status: Abnormal   Collection Time: 07/29/16  5:22 PM  Result Value Ref Range   Glucose-Capillary 122 (H) 65 - 99 mg/dL  Glucose, capillary     Status: Abnormal   Collection Time: 07/29/16  8:03 PM  Result Value Ref Range   Glucose-Capillary 193 (H) 65 - 99 mg/dL  Glucose, capillary     Status: Abnormal   Collection Time: 07/30/16 12:08 AM  Result Value Ref Range   Glucose-Capillary 118 (H) 65 - 99 mg/dL  Glucose, capillary     Status: Abnormal    Collection Time: 07/30/16  4:11 AM  Result Value Ref Range   Glucose-Capillary 124 (H) 65 - 99  mg/dL  Glucose, capillary     Status: None   Collection Time: 07/30/16  7:42 AM  Result Value Ref Range   Glucose-Capillary 89 65 - 99 mg/dL     Lipid Panel     Component Value Date/Time   CHOL 161 07/27/2016 0547   TRIG 52 07/27/2016 0547   HDL 66 07/27/2016 0547   CHOLHDL 2.4 07/27/2016 0547   VLDL 10 07/27/2016 0547   LDLCALC 85 07/27/2016 0547     Lab Results  Component Value Date   HGBA1C 6.3 (H) 07/27/2016        HPI :  70 y.o.femalewith medical history significant for prior CVA, history of PE and paroxysmal atrial fibrillation on chronic anticoagulation with eliquis, chronic kidney disease on dialysis, hypertension, significant peripheral vascular disease 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 Patient was undergoing dialysis in the outpatient setting today and had completed about one half of her treatment when she was noticed to experienced abrupt onset change of mental status with facial drooping and slurred speech. Her blood pressure also dropped into the 70 systolic and quickly rebounded into the 180s.Patient was brought to the ER as a code stroke. Initial CT head unremarkable. Stat MRI MRA obtained. Patient's NIHSS was 4. She was not a candidate for TPA secondary to eliquis administration within 48 hours of presentation. She was evaluated by Dr. Leonel Ramsay with neurology. MRA did reveal severe stenosis of the left PCA versus occlusion and on exam patient was found by neurology to have right hemianopia and mild latency of speech. Neurology also felt that vascular intervention to the left PCA would not be in her favor since her symptoms were mild and she has severe vascular disease   HOSPITAL COURSE:    Visual changes/History of stroke -Patient presents with visual  change, neurology feels right hemianopia is a baseline finding from old strokes She also came in with mental status change and transient worsening of baseline aphasia and right-sided weakness concerning for possible TIA versus stroke t MRI and EEG on 9/14 are negative. Patient had a repeat MRI on 9/17. Patient was found to have widespread chronic ischemia with no acute infarction -Could be related to transient hypotension/hypoperfusion of the brain. Suspected patient has transient hypotension related to pauses and bradycardia contributing to her neurologic symptoms Patient was evaluated by neurology -Given known eliquis not TPA candidate and given mild symptomatology and severe vascular disease possible left PCA stenosis not appropriate for intervention per neurology -MRI could not exclude early lacunar infarcts on the right-for to neurology'sinterpretation of MRI films -Echo  showed EF of 65-70%, no cardiac source of emboli -Carotid duplex Right : 40-59% internal carotid artery stenosis. Left :1% to 39% internal carotid arterystenosis -Hemoglobin A1c 6.3 and  LDL 85 Neurochecks stable -Continue preadmission aspirin now reduced to 81 mg and eliquis per neurology -PT/OT/SLP  -recommended SNF, regular diet. However patient was on observation and does not qualify for SNF      Chest pain-atypical EKG unchanged and troponins are flat Likely esophageal spasm-start PPI 2-D echo on 9/15 showed EF of 65-70% without any wall motion abnormalities    ESRD on dialysis  -Completed half of today's dialysis -Nephrology aware of admission  Type II diabetes mellitus with neurological manifestations  -Hemoglobin A1c 6.3, hypoglycemic on admission on multiple occasions -Follow CBGs and provide SSI Discontinued Lantus,     Essential hypertension -Apparent episodic hypotension during  dialysis now resolved  Resume Coreg at a lower dose Troponins are flat and unchanged, 2-D echo does not  show any wall motion abnormalities Chest pain this morning, EKG unchanged from 9/15   History of pulmonary embolism -Continue eliquis  Paroxysmal atrial fibrillation  -Continue eliquis -Patient currently maintaining first degreeAV block with sinus etiology whichwas documented on 12-lead as well as bedside telemetry;also noted with episodes of very brief sinus pausesversus sinus arrhythmia Resume Coreg at a lower dose, no pauses noted on telemetry since admission -CHADVASc = 7    Patient is Jehovah's Witness -No blood products  ?? Seizure disorder  EEG negative    PVD/post left AKA (Feb 2017) -Primarily wheelchair-bound secondary to ill fitting prosthesis  Discharge Exam:   Blood pressure (!) 156/62, pulse 71, temperature 98.3 F (36.8 C), temperature source Oral, resp. rate 16, height '5\' 6"'  (1.676 m), weight 69.6 kg (153 lb 7 oz), SpO2 100 %.    General exam: Appears calm and comfortable  Respiratory system: Clear to auscultation. Respiratory effort normal. Cardiovascular system: S1 & S2 heard, RRR. No JVD, murmurs, rubs, gallops or clicks. No pedal edema. Gastrointestinal system: Abdomen is nondistended, soft and nontender. No organomegaly or masses felt. Normal bowel sounds heard. Central nervous system: Alert and oriented. No focal neurological deficits. Extremities: Symmetric 5 x 5 power. Skin: No rashes, lesions or ulcers Psychiatry: Judgement and insight appear normal. Mood & affect appropriate.   Follow-up Information    SANFORD, Meredith Leeds, MD. Schedule an appointment as soon as possible for a visit in 2 day(s).   Specialty:  Nephrology Why:  Hospital follow-up Contact information: [place] [id number] [phone]           Signed: Reyne Dumas 07/30/2016, 9:17 AM        Time spent >45 mins

## 2016-07-30 NOTE — Progress Notes (Signed)
Occupational Therapy Treatment Patient Details Name: Deborah Hendricks MRN: [id number] DOB: [birth year] Today's Date: 07/30/2016    History of present illness 70 y.o. female admitted for AMS, weakness, slurred speech, and R visual field loss. MRI (-) and EEG positive for L temporal lobe abnormal activity. PMH significant for CVA, ESRD, PAF, PE, DM, seizures, L AKA.   OT comments  This 70 yo female admitted with above presents to acute OT making progress with basic ADLs and reports she has A with these prn 5 days a week from [place] worker. Pt to D/C home today and HHOT is recommended.   Follow Up Recommendations  Home health OT;Supervision/Assistance - 24 hour    Equipment Recommendations  None recommended by OT       Precautions / Restrictions Precautions Precautions: Fall Restrictions Weight Bearing Restrictions: No       Mobility Bed Mobility               General bed mobility comments: Pt sitting EOB upon arrival  Transfers Overall transfer level: Needs assistance   Transfers: Lateral/Scoot Transfers          Lateral/Scoot Transfers: Min guard      Balance Overall balance assessment: Needs assistance Sitting-balance support: No upper extremity supported;Feet supported Sitting balance-Leahy Scale: Good                             ADL Overall ADL's : Needs assistance/impaired Eating/Feeding: Independent (sitting EOB)   Grooming: Supervision/safety;Set up (sittin EOB)   Upper Body Bathing: Supervision/ safety;Set up (sitting EOB)   Lower Body Bathing: Supervison/ safety;Set up (sit<>lateral lean EOB)   Upper Body Dressing : Set up;Supervision/safety (sitting EOB)   Lower Body Dressing: Supervision/safety;Set up (sit<>lateral lean EOB)   Toilet Transfer: Minimal assistance (lateral scoot from bed to drop arm recliner)   Toileting- Clothing Manipulation and Hygiene: Minimal assistance (sit<>lateral lean)                Vision                  Additional Comments: pt compensated well for any deificts that she has visually. She turned her head to find items without cues to do so          Cognition   Behavior During Therapy: [facility] for tasks assessed/performed Overall Cognitive Status: Within Functional Limits for tasks assessed                       Extremity/Trunk Assessment  Upper Extremity Assessment Upper Extremity Assessment: Generalized weakness                       Pertinent Vitals/ Pain       Pain Assessment: No/denies pain  Home Living Family/patient expects to be discharged to:: Skilled nursing facility Living Arrangements: Children Available Help at Discharge: Family;Available PRN/intermittently Type of Home: House Home Access: Ramped entrance     Home Layout: One level         Bathroom Toilet: Standard     Home Equipment: Walker - 2 wheels;Bedside commode;Wheelchair - manual;Hospital bed;Other (comment) (transfer board)   Additional Comments: Pt's daughter works out of town for weeks or months at a time and pt's grandson works evenings.   Lives With: Daughter (and grandson)    Prior Functioning/Environment Level of Independence: Needs assistance  Gait / Transfers Assistance Needed: Pt reports being able to  transfer from w/c to bed/BSC/chair independently and uses sliding board for car transfers ADL's / Homemaking Assistance Needed: Pt sponge bathes at sink and gets dressed independently. Grandson and daughter assist with IADLs and provide transportation. Pt also has a PCA 5 days a week 3-8 hours a day       Frequency  Min 2X/week        Progress Toward Goals  OT Goals(current goals can now be found in the care plan section)  Progress towards OT goals: Progressing toward goals     Plan Discharge plan remains appropriate          Activity Tolerance Patient tolerated treatment well   Patient Left in chair;with call bell/phone within reach   Nurse  Communication  (pt has gotten herself dressed and is ready to go, telemetry monitor is beeping)        Time: 1335-1419 OT Time Calculation (min): 44 min  Charges: OT General Charges $OT Visit: 1 Procedure OT Treatments $Self Care/Home Management : 38-52 mins  Almon Register [id number] 07/30/2016, 2:39 PM

## 2016-07-30 NOTE — Care Management Note (Addendum)
Case Management Note  Patient Details  Name: Katiuska Malinski MRN: [id number] Date of Birth: [birth year]  Subjective/Objective:      CM following for progression and d/c planning.              Action/Plan: 07/30/2016 Noted that pt has [place] aide for [place] from [place]. She has selected [facility] for HHPT and HHOT services. [place] notified of [place] needs.  PT has DME.   Expected Discharge Date:  07/30/16               Expected Discharge Plan:  [place]  In-House Referral:     Discharge planning Services  CM Consult  Post Acute Care Choice:  Home Health Choice offered to:  Patient  DME Arranged:   NA DME Agency:  NA  HH Arranged:  Nurse's Aide for [place] , [place] and HHOT. [place] Agency:  [facility] for [facility] aide, [facility] for [place] and HHPT.   Status of Service:  Completed.   If discussed at [place] of Stay Meetings, dates discussed:    Additional Comments:  Adron Bene, RN 07/30/2016, 2:01 PM

## 2016-07-30 NOTE — Progress Notes (Signed)
Patient discharge teaching given, including activity, diet, follow-up appoints, and medications. Patient verbalized understanding of all discharge instructions. IV access was d/c'd. Vitals are stable. Skin is intact except as charted in most recent assessments. Pt to be escorted out by NT, to be driven home by family.  Vista Sawatzky, MBA, BSN, RN 

## 2016-07-31 DIAGNOSIS — N2581 Secondary hyperparathyroidism of renal origin: Secondary | ICD-10-CM | POA: Diagnosis not present

## 2016-07-31 DIAGNOSIS — E119 Type 2 diabetes mellitus without complications: Secondary | ICD-10-CM | POA: Diagnosis not present

## 2016-07-31 DIAGNOSIS — D500 Iron deficiency anemia secondary to blood loss (chronic): Secondary | ICD-10-CM | POA: Diagnosis not present

## 2016-07-31 DIAGNOSIS — D631 Anemia in chronic kidney disease: Secondary | ICD-10-CM | POA: Diagnosis not present

## 2016-07-31 DIAGNOSIS — L03115 Cellulitis of right lower limb: Secondary | ICD-10-CM | POA: Diagnosis not present

## 2016-07-31 DIAGNOSIS — N186 End stage renal disease: Secondary | ICD-10-CM | POA: Diagnosis not present

## 2016-08-01 DIAGNOSIS — G40909 Epilepsy, unspecified, not intractable, without status epilepticus: Secondary | ICD-10-CM | POA: Diagnosis not present

## 2016-08-01 DIAGNOSIS — N186 End stage renal disease: Secondary | ICD-10-CM | POA: Diagnosis not present

## 2016-08-01 DIAGNOSIS — E1151 Type 2 diabetes mellitus with diabetic peripheral angiopathy without gangrene: Secondary | ICD-10-CM | POA: Diagnosis not present

## 2016-08-01 DIAGNOSIS — Z86711 Personal history of pulmonary embolism: Secondary | ICD-10-CM | POA: Diagnosis not present

## 2016-08-01 DIAGNOSIS — F329 Major depressive disorder, single episode, unspecified: Secondary | ICD-10-CM | POA: Diagnosis not present

## 2016-08-01 DIAGNOSIS — I132 Hypertensive heart and chronic kidney disease with heart failure and with stage 5 chronic kidney disease, or end stage renal disease: Secondary | ICD-10-CM | POA: Diagnosis not present

## 2016-08-01 DIAGNOSIS — I509 Heart failure, unspecified: Secondary | ICD-10-CM | POA: Diagnosis not present

## 2016-08-01 DIAGNOSIS — Z992 Dependence on renal dialysis: Secondary | ICD-10-CM | POA: Diagnosis not present

## 2016-08-01 DIAGNOSIS — Z4781 Encounter for orthopedic aftercare following surgical amputation: Secondary | ICD-10-CM | POA: Diagnosis not present

## 2016-08-01 DIAGNOSIS — Z87891 Personal history of nicotine dependence: Secondary | ICD-10-CM | POA: Diagnosis not present

## 2016-08-01 DIAGNOSIS — F419 Anxiety disorder, unspecified: Secondary | ICD-10-CM | POA: Diagnosis not present

## 2016-08-01 DIAGNOSIS — D649 Anemia, unspecified: Secondary | ICD-10-CM | POA: Diagnosis not present

## 2016-08-01 DIAGNOSIS — R1312 Dysphagia, oropharyngeal phase: Secondary | ICD-10-CM | POA: Diagnosis not present

## 2016-08-01 DIAGNOSIS — Z89612 Acquired absence of left leg above knee: Secondary | ICD-10-CM | POA: Diagnosis not present

## 2016-08-01 DIAGNOSIS — I480 Paroxysmal atrial fibrillation: Secondary | ICD-10-CM | POA: Diagnosis not present

## 2016-08-01 DIAGNOSIS — I69391 Dysphagia following cerebral infarction: Secondary | ICD-10-CM | POA: Diagnosis not present

## 2016-08-01 DIAGNOSIS — E1122 Type 2 diabetes mellitus with diabetic chronic kidney disease: Secondary | ICD-10-CM | POA: Diagnosis not present

## 2016-08-02 DIAGNOSIS — N2581 Secondary hyperparathyroidism of renal origin: Secondary | ICD-10-CM | POA: Diagnosis not present

## 2016-08-02 DIAGNOSIS — N186 End stage renal disease: Secondary | ICD-10-CM | POA: Diagnosis not present

## 2016-08-02 DIAGNOSIS — D500 Iron deficiency anemia secondary to blood loss (chronic): Secondary | ICD-10-CM | POA: Diagnosis not present

## 2016-08-02 DIAGNOSIS — L03115 Cellulitis of right lower limb: Secondary | ICD-10-CM | POA: Diagnosis not present

## 2016-08-02 DIAGNOSIS — D631 Anemia in chronic kidney disease: Secondary | ICD-10-CM | POA: Diagnosis not present

## 2016-08-02 DIAGNOSIS — E119 Type 2 diabetes mellitus without complications: Secondary | ICD-10-CM | POA: Diagnosis not present

## 2016-08-04 DIAGNOSIS — D500 Iron deficiency anemia secondary to blood loss (chronic): Secondary | ICD-10-CM | POA: Diagnosis not present

## 2016-08-04 DIAGNOSIS — L03115 Cellulitis of right lower limb: Secondary | ICD-10-CM | POA: Diagnosis not present

## 2016-08-04 DIAGNOSIS — D631 Anemia in chronic kidney disease: Secondary | ICD-10-CM | POA: Diagnosis not present

## 2016-08-04 DIAGNOSIS — N186 End stage renal disease: Secondary | ICD-10-CM | POA: Diagnosis not present

## 2016-08-04 DIAGNOSIS — E119 Type 2 diabetes mellitus without complications: Secondary | ICD-10-CM | POA: Diagnosis not present

## 2016-08-04 DIAGNOSIS — N2581 Secondary hyperparathyroidism of renal origin: Secondary | ICD-10-CM | POA: Diagnosis not present

## 2016-08-06 DIAGNOSIS — J329 Chronic sinusitis, unspecified: Secondary | ICD-10-CM | POA: Diagnosis not present

## 2016-08-06 DIAGNOSIS — N186 End stage renal disease: Secondary | ICD-10-CM | POA: Diagnosis not present

## 2016-08-06 DIAGNOSIS — B9789 Other viral agents as the cause of diseases classified elsewhere: Secondary | ICD-10-CM | POA: Diagnosis not present

## 2016-08-06 DIAGNOSIS — Z992 Dependence on renal dialysis: Secondary | ICD-10-CM | POA: Diagnosis not present

## 2016-08-06 DIAGNOSIS — Z794 Long term (current) use of insulin: Secondary | ICD-10-CM | POA: Diagnosis not present

## 2016-08-06 DIAGNOSIS — E1165 Type 2 diabetes mellitus with hyperglycemia: Secondary | ICD-10-CM | POA: Diagnosis not present

## 2016-08-06 DIAGNOSIS — I959 Hypotension, unspecified: Secondary | ICD-10-CM | POA: Diagnosis not present

## 2016-08-07 DIAGNOSIS — L03115 Cellulitis of right lower limb: Secondary | ICD-10-CM | POA: Diagnosis not present

## 2016-08-07 DIAGNOSIS — E119 Type 2 diabetes mellitus without complications: Secondary | ICD-10-CM | POA: Diagnosis not present

## 2016-08-07 DIAGNOSIS — N186 End stage renal disease: Secondary | ICD-10-CM | POA: Diagnosis not present

## 2016-08-07 DIAGNOSIS — D500 Iron deficiency anemia secondary to blood loss (chronic): Secondary | ICD-10-CM | POA: Diagnosis not present

## 2016-08-07 DIAGNOSIS — N2581 Secondary hyperparathyroidism of renal origin: Secondary | ICD-10-CM | POA: Diagnosis not present

## 2016-08-07 DIAGNOSIS — D631 Anemia in chronic kidney disease: Secondary | ICD-10-CM | POA: Diagnosis not present

## 2016-08-08 DIAGNOSIS — I480 Paroxysmal atrial fibrillation: Secondary | ICD-10-CM | POA: Diagnosis not present

## 2016-08-08 DIAGNOSIS — I132 Hypertensive heart and chronic kidney disease with heart failure and with stage 5 chronic kidney disease, or end stage renal disease: Secondary | ICD-10-CM | POA: Diagnosis not present

## 2016-08-08 DIAGNOSIS — Z4781 Encounter for orthopedic aftercare following surgical amputation: Secondary | ICD-10-CM | POA: Diagnosis not present

## 2016-08-08 DIAGNOSIS — N186 End stage renal disease: Secondary | ICD-10-CM | POA: Diagnosis not present

## 2016-08-08 DIAGNOSIS — I509 Heart failure, unspecified: Secondary | ICD-10-CM | POA: Diagnosis not present

## 2016-08-08 DIAGNOSIS — E1122 Type 2 diabetes mellitus with diabetic chronic kidney disease: Secondary | ICD-10-CM | POA: Diagnosis not present

## 2016-08-09 DIAGNOSIS — D631 Anemia in chronic kidney disease: Secondary | ICD-10-CM | POA: Diagnosis not present

## 2016-08-09 DIAGNOSIS — N2581 Secondary hyperparathyroidism of renal origin: Secondary | ICD-10-CM | POA: Diagnosis not present

## 2016-08-09 DIAGNOSIS — D500 Iron deficiency anemia secondary to blood loss (chronic): Secondary | ICD-10-CM | POA: Diagnosis not present

## 2016-08-09 DIAGNOSIS — L03115 Cellulitis of right lower limb: Secondary | ICD-10-CM | POA: Diagnosis not present

## 2016-08-09 DIAGNOSIS — E119 Type 2 diabetes mellitus without complications: Secondary | ICD-10-CM | POA: Diagnosis not present

## 2016-08-09 DIAGNOSIS — N186 End stage renal disease: Secondary | ICD-10-CM | POA: Diagnosis not present

## 2016-08-10 DIAGNOSIS — I480 Paroxysmal atrial fibrillation: Secondary | ICD-10-CM | POA: Diagnosis not present

## 2016-08-10 DIAGNOSIS — N186 End stage renal disease: Secondary | ICD-10-CM | POA: Diagnosis not present

## 2016-08-10 DIAGNOSIS — I509 Heart failure, unspecified: Secondary | ICD-10-CM | POA: Diagnosis not present

## 2016-08-10 DIAGNOSIS — E1122 Type 2 diabetes mellitus with diabetic chronic kidney disease: Secondary | ICD-10-CM | POA: Diagnosis not present

## 2016-08-10 DIAGNOSIS — Z4781 Encounter for orthopedic aftercare following surgical amputation: Secondary | ICD-10-CM | POA: Diagnosis not present

## 2016-08-10 DIAGNOSIS — I132 Hypertensive heart and chronic kidney disease with heart failure and with stage 5 chronic kidney disease, or end stage renal disease: Secondary | ICD-10-CM | POA: Diagnosis not present

## 2016-08-11 DIAGNOSIS — I120 Hypertensive chronic kidney disease with stage 5 chronic kidney disease or end stage renal disease: Secondary | ICD-10-CM | POA: Diagnosis not present

## 2016-08-11 DIAGNOSIS — D631 Anemia in chronic kidney disease: Secondary | ICD-10-CM | POA: Diagnosis not present

## 2016-08-11 DIAGNOSIS — L03115 Cellulitis of right lower limb: Secondary | ICD-10-CM | POA: Diagnosis not present

## 2016-08-11 DIAGNOSIS — N186 End stage renal disease: Secondary | ICD-10-CM | POA: Diagnosis not present

## 2016-08-11 DIAGNOSIS — Z992 Dependence on renal dialysis: Secondary | ICD-10-CM | POA: Diagnosis not present

## 2016-08-11 DIAGNOSIS — E119 Type 2 diabetes mellitus without complications: Secondary | ICD-10-CM | POA: Diagnosis not present

## 2016-08-11 DIAGNOSIS — D500 Iron deficiency anemia secondary to blood loss (chronic): Secondary | ICD-10-CM | POA: Diagnosis not present

## 2016-08-11 DIAGNOSIS — N2581 Secondary hyperparathyroidism of renal origin: Secondary | ICD-10-CM | POA: Diagnosis not present

## 2016-08-13 DIAGNOSIS — I480 Paroxysmal atrial fibrillation: Secondary | ICD-10-CM | POA: Diagnosis not present

## 2016-08-13 DIAGNOSIS — N186 End stage renal disease: Secondary | ICD-10-CM | POA: Diagnosis not present

## 2016-08-13 DIAGNOSIS — Z4781 Encounter for orthopedic aftercare following surgical amputation: Secondary | ICD-10-CM | POA: Diagnosis not present

## 2016-08-13 DIAGNOSIS — I509 Heart failure, unspecified: Secondary | ICD-10-CM | POA: Diagnosis not present

## 2016-08-13 DIAGNOSIS — I132 Hypertensive heart and chronic kidney disease with heart failure and with stage 5 chronic kidney disease, or end stage renal disease: Secondary | ICD-10-CM | POA: Diagnosis not present

## 2016-08-13 DIAGNOSIS — E1122 Type 2 diabetes mellitus with diabetic chronic kidney disease: Secondary | ICD-10-CM | POA: Diagnosis not present

## 2016-08-14 DIAGNOSIS — L03115 Cellulitis of right lower limb: Secondary | ICD-10-CM | POA: Diagnosis not present

## 2016-08-14 DIAGNOSIS — D500 Iron deficiency anemia secondary to blood loss (chronic): Secondary | ICD-10-CM | POA: Diagnosis not present

## 2016-08-14 DIAGNOSIS — D631 Anemia in chronic kidney disease: Secondary | ICD-10-CM | POA: Diagnosis not present

## 2016-08-14 DIAGNOSIS — T8249XD Other complication of vascular dialysis catheter, subsequent encounter: Secondary | ICD-10-CM | POA: Diagnosis not present

## 2016-08-14 DIAGNOSIS — N186 End stage renal disease: Secondary | ICD-10-CM | POA: Diagnosis not present

## 2016-08-14 DIAGNOSIS — N2581 Secondary hyperparathyroidism of renal origin: Secondary | ICD-10-CM | POA: Diagnosis not present

## 2016-08-15 DIAGNOSIS — I509 Heart failure, unspecified: Secondary | ICD-10-CM | POA: Diagnosis not present

## 2016-08-15 DIAGNOSIS — I132 Hypertensive heart and chronic kidney disease with heart failure and with stage 5 chronic kidney disease, or end stage renal disease: Secondary | ICD-10-CM | POA: Diagnosis not present

## 2016-08-15 DIAGNOSIS — I480 Paroxysmal atrial fibrillation: Secondary | ICD-10-CM | POA: Diagnosis not present

## 2016-08-15 DIAGNOSIS — N186 End stage renal disease: Secondary | ICD-10-CM | POA: Diagnosis not present

## 2016-08-15 DIAGNOSIS — E1122 Type 2 diabetes mellitus with diabetic chronic kidney disease: Secondary | ICD-10-CM | POA: Diagnosis not present

## 2016-08-15 DIAGNOSIS — Z4781 Encounter for orthopedic aftercare following surgical amputation: Secondary | ICD-10-CM | POA: Diagnosis not present

## 2016-08-16 DIAGNOSIS — D631 Anemia in chronic kidney disease: Secondary | ICD-10-CM | POA: Diagnosis not present

## 2016-08-16 DIAGNOSIS — D500 Iron deficiency anemia secondary to blood loss (chronic): Secondary | ICD-10-CM | POA: Diagnosis not present

## 2016-08-16 DIAGNOSIS — N186 End stage renal disease: Secondary | ICD-10-CM | POA: Diagnosis not present

## 2016-08-16 DIAGNOSIS — T8249XD Other complication of vascular dialysis catheter, subsequent encounter: Secondary | ICD-10-CM | POA: Diagnosis not present

## 2016-08-16 DIAGNOSIS — N2581 Secondary hyperparathyroidism of renal origin: Secondary | ICD-10-CM | POA: Diagnosis not present

## 2016-08-16 DIAGNOSIS — L03115 Cellulitis of right lower limb: Secondary | ICD-10-CM | POA: Diagnosis not present

## 2016-08-18 DIAGNOSIS — N186 End stage renal disease: Secondary | ICD-10-CM | POA: Diagnosis not present

## 2016-08-18 DIAGNOSIS — L03115 Cellulitis of right lower limb: Secondary | ICD-10-CM | POA: Diagnosis not present

## 2016-08-18 DIAGNOSIS — T8249XD Other complication of vascular dialysis catheter, subsequent encounter: Secondary | ICD-10-CM | POA: Diagnosis not present

## 2016-08-18 DIAGNOSIS — D631 Anemia in chronic kidney disease: Secondary | ICD-10-CM | POA: Diagnosis not present

## 2016-08-18 DIAGNOSIS — D500 Iron deficiency anemia secondary to blood loss (chronic): Secondary | ICD-10-CM | POA: Diagnosis not present

## 2016-08-18 DIAGNOSIS — N2581 Secondary hyperparathyroidism of renal origin: Secondary | ICD-10-CM | POA: Diagnosis not present

## 2016-08-20 DIAGNOSIS — E1122 Type 2 diabetes mellitus with diabetic chronic kidney disease: Secondary | ICD-10-CM | POA: Diagnosis not present

## 2016-08-20 DIAGNOSIS — I132 Hypertensive heart and chronic kidney disease with heart failure and with stage 5 chronic kidney disease, or end stage renal disease: Secondary | ICD-10-CM | POA: Diagnosis not present

## 2016-08-20 DIAGNOSIS — N186 End stage renal disease: Secondary | ICD-10-CM | POA: Diagnosis not present

## 2016-08-20 DIAGNOSIS — I480 Paroxysmal atrial fibrillation: Secondary | ICD-10-CM | POA: Diagnosis not present

## 2016-08-20 DIAGNOSIS — I509 Heart failure, unspecified: Secondary | ICD-10-CM | POA: Diagnosis not present

## 2016-08-20 DIAGNOSIS — Z4781 Encounter for orthopedic aftercare following surgical amputation: Secondary | ICD-10-CM | POA: Diagnosis not present

## 2016-08-21 DIAGNOSIS — N2581 Secondary hyperparathyroidism of renal origin: Secondary | ICD-10-CM | POA: Diagnosis not present

## 2016-08-21 DIAGNOSIS — D500 Iron deficiency anemia secondary to blood loss (chronic): Secondary | ICD-10-CM | POA: Diagnosis not present

## 2016-08-21 DIAGNOSIS — D631 Anemia in chronic kidney disease: Secondary | ICD-10-CM | POA: Diagnosis not present

## 2016-08-21 DIAGNOSIS — T8249XD Other complication of vascular dialysis catheter, subsequent encounter: Secondary | ICD-10-CM | POA: Diagnosis not present

## 2016-08-21 DIAGNOSIS — L03115 Cellulitis of right lower limb: Secondary | ICD-10-CM | POA: Diagnosis not present

## 2016-08-21 DIAGNOSIS — N186 End stage renal disease: Secondary | ICD-10-CM | POA: Diagnosis not present

## 2016-08-22 DIAGNOSIS — I509 Heart failure, unspecified: Secondary | ICD-10-CM | POA: Diagnosis not present

## 2016-08-22 DIAGNOSIS — N186 End stage renal disease: Secondary | ICD-10-CM | POA: Diagnosis not present

## 2016-08-22 DIAGNOSIS — E1122 Type 2 diabetes mellitus with diabetic chronic kidney disease: Secondary | ICD-10-CM | POA: Diagnosis not present

## 2016-08-22 DIAGNOSIS — I132 Hypertensive heart and chronic kidney disease with heart failure and with stage 5 chronic kidney disease, or end stage renal disease: Secondary | ICD-10-CM | POA: Diagnosis not present

## 2016-08-22 DIAGNOSIS — Z4781 Encounter for orthopedic aftercare following surgical amputation: Secondary | ICD-10-CM | POA: Diagnosis not present

## 2016-08-22 DIAGNOSIS — I480 Paroxysmal atrial fibrillation: Secondary | ICD-10-CM | POA: Diagnosis not present

## 2016-08-23 DIAGNOSIS — T8249XD Other complication of vascular dialysis catheter, subsequent encounter: Secondary | ICD-10-CM | POA: Diagnosis not present

## 2016-08-23 DIAGNOSIS — D500 Iron deficiency anemia secondary to blood loss (chronic): Secondary | ICD-10-CM | POA: Diagnosis not present

## 2016-08-23 DIAGNOSIS — N186 End stage renal disease: Secondary | ICD-10-CM | POA: Diagnosis not present

## 2016-08-23 DIAGNOSIS — N2581 Secondary hyperparathyroidism of renal origin: Secondary | ICD-10-CM | POA: Diagnosis not present

## 2016-08-23 DIAGNOSIS — L03115 Cellulitis of right lower limb: Secondary | ICD-10-CM | POA: Diagnosis not present

## 2016-08-23 DIAGNOSIS — D631 Anemia in chronic kidney disease: Secondary | ICD-10-CM | POA: Diagnosis not present

## 2016-08-25 DIAGNOSIS — T8249XD Other complication of vascular dialysis catheter, subsequent encounter: Secondary | ICD-10-CM | POA: Diagnosis not present

## 2016-08-25 DIAGNOSIS — D631 Anemia in chronic kidney disease: Secondary | ICD-10-CM | POA: Diagnosis not present

## 2016-08-25 DIAGNOSIS — L03115 Cellulitis of right lower limb: Secondary | ICD-10-CM | POA: Diagnosis not present

## 2016-08-25 DIAGNOSIS — N2581 Secondary hyperparathyroidism of renal origin: Secondary | ICD-10-CM | POA: Diagnosis not present

## 2016-08-25 DIAGNOSIS — D500 Iron deficiency anemia secondary to blood loss (chronic): Secondary | ICD-10-CM | POA: Diagnosis not present

## 2016-08-25 DIAGNOSIS — N186 End stage renal disease: Secondary | ICD-10-CM | POA: Diagnosis not present

## 2016-08-27 DIAGNOSIS — I132 Hypertensive heart and chronic kidney disease with heart failure and with stage 5 chronic kidney disease, or end stage renal disease: Secondary | ICD-10-CM | POA: Diagnosis not present

## 2016-08-27 DIAGNOSIS — I509 Heart failure, unspecified: Secondary | ICD-10-CM | POA: Diagnosis not present

## 2016-08-27 DIAGNOSIS — I480 Paroxysmal atrial fibrillation: Secondary | ICD-10-CM | POA: Diagnosis not present

## 2016-08-27 DIAGNOSIS — Z4781 Encounter for orthopedic aftercare following surgical amputation: Secondary | ICD-10-CM | POA: Diagnosis not present

## 2016-08-27 DIAGNOSIS — E1122 Type 2 diabetes mellitus with diabetic chronic kidney disease: Secondary | ICD-10-CM | POA: Diagnosis not present

## 2016-08-27 DIAGNOSIS — N186 End stage renal disease: Secondary | ICD-10-CM | POA: Diagnosis not present

## 2016-08-28 DIAGNOSIS — L03115 Cellulitis of right lower limb: Secondary | ICD-10-CM | POA: Diagnosis not present

## 2016-08-28 DIAGNOSIS — D500 Iron deficiency anemia secondary to blood loss (chronic): Secondary | ICD-10-CM | POA: Diagnosis not present

## 2016-08-28 DIAGNOSIS — N186 End stage renal disease: Secondary | ICD-10-CM | POA: Diagnosis not present

## 2016-08-28 DIAGNOSIS — D631 Anemia in chronic kidney disease: Secondary | ICD-10-CM | POA: Diagnosis not present

## 2016-08-28 DIAGNOSIS — N2581 Secondary hyperparathyroidism of renal origin: Secondary | ICD-10-CM | POA: Diagnosis not present

## 2016-08-28 DIAGNOSIS — T8249XD Other complication of vascular dialysis catheter, subsequent encounter: Secondary | ICD-10-CM | POA: Diagnosis not present

## 2016-08-29 DIAGNOSIS — I509 Heart failure, unspecified: Secondary | ICD-10-CM | POA: Diagnosis not present

## 2016-08-29 DIAGNOSIS — I132 Hypertensive heart and chronic kidney disease with heart failure and with stage 5 chronic kidney disease, or end stage renal disease: Secondary | ICD-10-CM | POA: Diagnosis not present

## 2016-08-29 DIAGNOSIS — I480 Paroxysmal atrial fibrillation: Secondary | ICD-10-CM | POA: Diagnosis not present

## 2016-08-29 DIAGNOSIS — E1122 Type 2 diabetes mellitus with diabetic chronic kidney disease: Secondary | ICD-10-CM | POA: Diagnosis not present

## 2016-08-29 DIAGNOSIS — Z4781 Encounter for orthopedic aftercare following surgical amputation: Secondary | ICD-10-CM | POA: Diagnosis not present

## 2016-08-29 DIAGNOSIS — N186 End stage renal disease: Secondary | ICD-10-CM | POA: Diagnosis not present

## 2016-08-30 DIAGNOSIS — T8249XD Other complication of vascular dialysis catheter, subsequent encounter: Secondary | ICD-10-CM | POA: Diagnosis not present

## 2016-08-30 DIAGNOSIS — D631 Anemia in chronic kidney disease: Secondary | ICD-10-CM | POA: Diagnosis not present

## 2016-08-30 DIAGNOSIS — N186 End stage renal disease: Secondary | ICD-10-CM | POA: Diagnosis not present

## 2016-08-30 DIAGNOSIS — D500 Iron deficiency anemia secondary to blood loss (chronic): Secondary | ICD-10-CM | POA: Diagnosis not present

## 2016-08-30 DIAGNOSIS — N2581 Secondary hyperparathyroidism of renal origin: Secondary | ICD-10-CM | POA: Diagnosis not present

## 2016-08-30 DIAGNOSIS — L03115 Cellulitis of right lower limb: Secondary | ICD-10-CM | POA: Diagnosis not present

## 2016-09-01 DIAGNOSIS — T8249XD Other complication of vascular dialysis catheter, subsequent encounter: Secondary | ICD-10-CM | POA: Diagnosis not present

## 2016-09-01 DIAGNOSIS — L03115 Cellulitis of right lower limb: Secondary | ICD-10-CM | POA: Diagnosis not present

## 2016-09-01 DIAGNOSIS — D500 Iron deficiency anemia secondary to blood loss (chronic): Secondary | ICD-10-CM | POA: Diagnosis not present

## 2016-09-01 DIAGNOSIS — N186 End stage renal disease: Secondary | ICD-10-CM | POA: Diagnosis not present

## 2016-09-01 DIAGNOSIS — D631 Anemia in chronic kidney disease: Secondary | ICD-10-CM | POA: Diagnosis not present

## 2016-09-01 DIAGNOSIS — N2581 Secondary hyperparathyroidism of renal origin: Secondary | ICD-10-CM | POA: Diagnosis not present

## 2016-09-03 DIAGNOSIS — Z4781 Encounter for orthopedic aftercare following surgical amputation: Secondary | ICD-10-CM | POA: Diagnosis not present

## 2016-09-03 DIAGNOSIS — E1122 Type 2 diabetes mellitus with diabetic chronic kidney disease: Secondary | ICD-10-CM | POA: Diagnosis not present

## 2016-09-03 DIAGNOSIS — I509 Heart failure, unspecified: Secondary | ICD-10-CM | POA: Diagnosis not present

## 2016-09-03 DIAGNOSIS — I480 Paroxysmal atrial fibrillation: Secondary | ICD-10-CM | POA: Diagnosis not present

## 2016-09-03 DIAGNOSIS — N186 End stage renal disease: Secondary | ICD-10-CM | POA: Diagnosis not present

## 2016-09-03 DIAGNOSIS — I132 Hypertensive heart and chronic kidney disease with heart failure and with stage 5 chronic kidney disease, or end stage renal disease: Secondary | ICD-10-CM | POA: Diagnosis not present

## 2016-09-04 DIAGNOSIS — N186 End stage renal disease: Secondary | ICD-10-CM | POA: Diagnosis not present

## 2016-09-04 DIAGNOSIS — D500 Iron deficiency anemia secondary to blood loss (chronic): Secondary | ICD-10-CM | POA: Diagnosis not present

## 2016-09-04 DIAGNOSIS — N2581 Secondary hyperparathyroidism of renal origin: Secondary | ICD-10-CM | POA: Diagnosis not present

## 2016-09-04 DIAGNOSIS — L03115 Cellulitis of right lower limb: Secondary | ICD-10-CM | POA: Diagnosis not present

## 2016-09-04 DIAGNOSIS — D631 Anemia in chronic kidney disease: Secondary | ICD-10-CM | POA: Diagnosis not present

## 2016-09-04 DIAGNOSIS — T8249XD Other complication of vascular dialysis catheter, subsequent encounter: Secondary | ICD-10-CM | POA: Diagnosis not present

## 2016-09-05 DIAGNOSIS — N186 End stage renal disease: Secondary | ICD-10-CM | POA: Diagnosis not present

## 2016-09-05 DIAGNOSIS — I132 Hypertensive heart and chronic kidney disease with heart failure and with stage 5 chronic kidney disease, or end stage renal disease: Secondary | ICD-10-CM | POA: Diagnosis not present

## 2016-09-05 DIAGNOSIS — Z4781 Encounter for orthopedic aftercare following surgical amputation: Secondary | ICD-10-CM | POA: Diagnosis not present

## 2016-09-05 DIAGNOSIS — I480 Paroxysmal atrial fibrillation: Secondary | ICD-10-CM | POA: Diagnosis not present

## 2016-09-05 DIAGNOSIS — E1122 Type 2 diabetes mellitus with diabetic chronic kidney disease: Secondary | ICD-10-CM | POA: Diagnosis not present

## 2016-09-05 DIAGNOSIS — I509 Heart failure, unspecified: Secondary | ICD-10-CM | POA: Diagnosis not present

## 2016-09-06 DIAGNOSIS — N186 End stage renal disease: Secondary | ICD-10-CM | POA: Diagnosis not present

## 2016-09-06 DIAGNOSIS — D500 Iron deficiency anemia secondary to blood loss (chronic): Secondary | ICD-10-CM | POA: Diagnosis not present

## 2016-09-06 DIAGNOSIS — L03115 Cellulitis of right lower limb: Secondary | ICD-10-CM | POA: Diagnosis not present

## 2016-09-06 DIAGNOSIS — D631 Anemia in chronic kidney disease: Secondary | ICD-10-CM | POA: Diagnosis not present

## 2016-09-06 DIAGNOSIS — E119 Type 2 diabetes mellitus without complications: Secondary | ICD-10-CM | POA: Diagnosis not present

## 2016-09-06 DIAGNOSIS — T8249XD Other complication of vascular dialysis catheter, subsequent encounter: Secondary | ICD-10-CM | POA: Diagnosis not present

## 2016-09-06 DIAGNOSIS — N2581 Secondary hyperparathyroidism of renal origin: Secondary | ICD-10-CM | POA: Diagnosis not present

## 2016-09-08 DIAGNOSIS — N2581 Secondary hyperparathyroidism of renal origin: Secondary | ICD-10-CM | POA: Diagnosis not present

## 2016-09-08 DIAGNOSIS — N186 End stage renal disease: Secondary | ICD-10-CM | POA: Diagnosis not present

## 2016-09-08 DIAGNOSIS — L03115 Cellulitis of right lower limb: Secondary | ICD-10-CM | POA: Diagnosis not present

## 2016-09-08 DIAGNOSIS — D500 Iron deficiency anemia secondary to blood loss (chronic): Secondary | ICD-10-CM | POA: Diagnosis not present

## 2016-09-08 DIAGNOSIS — D631 Anemia in chronic kidney disease: Secondary | ICD-10-CM | POA: Diagnosis not present

## 2016-09-08 DIAGNOSIS — T8249XD Other complication of vascular dialysis catheter, subsequent encounter: Secondary | ICD-10-CM | POA: Diagnosis not present

## 2016-09-11 DIAGNOSIS — L03115 Cellulitis of right lower limb: Secondary | ICD-10-CM | POA: Diagnosis not present

## 2016-09-11 DIAGNOSIS — Z992 Dependence on renal dialysis: Secondary | ICD-10-CM | POA: Diagnosis not present

## 2016-09-11 DIAGNOSIS — N2581 Secondary hyperparathyroidism of renal origin: Secondary | ICD-10-CM | POA: Diagnosis not present

## 2016-09-11 DIAGNOSIS — N186 End stage renal disease: Secondary | ICD-10-CM | POA: Diagnosis not present

## 2016-09-11 DIAGNOSIS — T8249XD Other complication of vascular dialysis catheter, subsequent encounter: Secondary | ICD-10-CM | POA: Diagnosis not present

## 2016-09-11 DIAGNOSIS — I120 Hypertensive chronic kidney disease with stage 5 chronic kidney disease or end stage renal disease: Secondary | ICD-10-CM | POA: Diagnosis not present

## 2016-09-11 DIAGNOSIS — D500 Iron deficiency anemia secondary to blood loss (chronic): Secondary | ICD-10-CM | POA: Diagnosis not present

## 2016-09-11 DIAGNOSIS — D631 Anemia in chronic kidney disease: Secondary | ICD-10-CM | POA: Diagnosis not present

## 2016-09-12 DIAGNOSIS — I132 Hypertensive heart and chronic kidney disease with heart failure and with stage 5 chronic kidney disease, or end stage renal disease: Secondary | ICD-10-CM | POA: Diagnosis not present

## 2016-09-12 DIAGNOSIS — N186 End stage renal disease: Secondary | ICD-10-CM | POA: Diagnosis not present

## 2016-09-12 DIAGNOSIS — I480 Paroxysmal atrial fibrillation: Secondary | ICD-10-CM | POA: Diagnosis not present

## 2016-09-12 DIAGNOSIS — Z4781 Encounter for orthopedic aftercare following surgical amputation: Secondary | ICD-10-CM | POA: Diagnosis not present

## 2016-09-12 DIAGNOSIS — E1122 Type 2 diabetes mellitus with diabetic chronic kidney disease: Secondary | ICD-10-CM | POA: Diagnosis not present

## 2016-09-12 DIAGNOSIS — I509 Heart failure, unspecified: Secondary | ICD-10-CM | POA: Diagnosis not present

## 2016-09-13 DIAGNOSIS — N2581 Secondary hyperparathyroidism of renal origin: Secondary | ICD-10-CM | POA: Diagnosis not present

## 2016-09-13 DIAGNOSIS — D500 Iron deficiency anemia secondary to blood loss (chronic): Secondary | ICD-10-CM | POA: Diagnosis not present

## 2016-09-13 DIAGNOSIS — D631 Anemia in chronic kidney disease: Secondary | ICD-10-CM | POA: Diagnosis not present

## 2016-09-13 DIAGNOSIS — E119 Type 2 diabetes mellitus without complications: Secondary | ICD-10-CM | POA: Diagnosis not present

## 2016-09-13 DIAGNOSIS — N186 End stage renal disease: Secondary | ICD-10-CM | POA: Diagnosis not present

## 2016-09-13 DIAGNOSIS — L03115 Cellulitis of right lower limb: Secondary | ICD-10-CM | POA: Diagnosis not present

## 2016-09-14 DIAGNOSIS — F339 Major depressive disorder, recurrent, unspecified: Secondary | ICD-10-CM | POA: Diagnosis not present

## 2016-09-14 DIAGNOSIS — Z4781 Encounter for orthopedic aftercare following surgical amputation: Secondary | ICD-10-CM | POA: Diagnosis not present

## 2016-09-14 DIAGNOSIS — N186 End stage renal disease: Secondary | ICD-10-CM | POA: Diagnosis not present

## 2016-09-14 DIAGNOSIS — I509 Heart failure, unspecified: Secondary | ICD-10-CM | POA: Diagnosis not present

## 2016-09-14 DIAGNOSIS — E1122 Type 2 diabetes mellitus with diabetic chronic kidney disease: Secondary | ICD-10-CM | POA: Diagnosis not present

## 2016-09-14 DIAGNOSIS — I480 Paroxysmal atrial fibrillation: Secondary | ICD-10-CM | POA: Diagnosis not present

## 2016-09-14 DIAGNOSIS — F411 Generalized anxiety disorder: Secondary | ICD-10-CM | POA: Diagnosis not present

## 2016-09-14 DIAGNOSIS — I132 Hypertensive heart and chronic kidney disease with heart failure and with stage 5 chronic kidney disease, or end stage renal disease: Secondary | ICD-10-CM | POA: Diagnosis not present

## 2016-09-15 DIAGNOSIS — D500 Iron deficiency anemia secondary to blood loss (chronic): Secondary | ICD-10-CM | POA: Diagnosis not present

## 2016-09-15 DIAGNOSIS — N2581 Secondary hyperparathyroidism of renal origin: Secondary | ICD-10-CM | POA: Diagnosis not present

## 2016-09-15 DIAGNOSIS — E119 Type 2 diabetes mellitus without complications: Secondary | ICD-10-CM | POA: Diagnosis not present

## 2016-09-15 DIAGNOSIS — N186 End stage renal disease: Secondary | ICD-10-CM | POA: Diagnosis not present

## 2016-09-15 DIAGNOSIS — D631 Anemia in chronic kidney disease: Secondary | ICD-10-CM | POA: Diagnosis not present

## 2016-09-15 DIAGNOSIS — L03115 Cellulitis of right lower limb: Secondary | ICD-10-CM | POA: Diagnosis not present

## 2016-09-18 DIAGNOSIS — N186 End stage renal disease: Secondary | ICD-10-CM | POA: Diagnosis not present

## 2016-09-18 DIAGNOSIS — N2581 Secondary hyperparathyroidism of renal origin: Secondary | ICD-10-CM | POA: Diagnosis not present

## 2016-09-18 DIAGNOSIS — D631 Anemia in chronic kidney disease: Secondary | ICD-10-CM | POA: Diagnosis not present

## 2016-09-18 DIAGNOSIS — D500 Iron deficiency anemia secondary to blood loss (chronic): Secondary | ICD-10-CM | POA: Diagnosis not present

## 2016-09-18 DIAGNOSIS — E119 Type 2 diabetes mellitus without complications: Secondary | ICD-10-CM | POA: Diagnosis not present

## 2016-09-18 DIAGNOSIS — L03115 Cellulitis of right lower limb: Secondary | ICD-10-CM | POA: Diagnosis not present

## 2016-09-19 DIAGNOSIS — E1122 Type 2 diabetes mellitus with diabetic chronic kidney disease: Secondary | ICD-10-CM | POA: Diagnosis not present

## 2016-09-19 DIAGNOSIS — N186 End stage renal disease: Secondary | ICD-10-CM | POA: Diagnosis not present

## 2016-09-19 DIAGNOSIS — I480 Paroxysmal atrial fibrillation: Secondary | ICD-10-CM | POA: Diagnosis not present

## 2016-09-19 DIAGNOSIS — Z4781 Encounter for orthopedic aftercare following surgical amputation: Secondary | ICD-10-CM | POA: Diagnosis not present

## 2016-09-19 DIAGNOSIS — I132 Hypertensive heart and chronic kidney disease with heart failure and with stage 5 chronic kidney disease, or end stage renal disease: Secondary | ICD-10-CM | POA: Diagnosis not present

## 2016-09-19 DIAGNOSIS — I509 Heart failure, unspecified: Secondary | ICD-10-CM | POA: Diagnosis not present

## 2016-09-21 DIAGNOSIS — I480 Paroxysmal atrial fibrillation: Secondary | ICD-10-CM | POA: Diagnosis not present

## 2016-09-21 DIAGNOSIS — I509 Heart failure, unspecified: Secondary | ICD-10-CM | POA: Diagnosis not present

## 2016-09-21 DIAGNOSIS — E1122 Type 2 diabetes mellitus with diabetic chronic kidney disease: Secondary | ICD-10-CM | POA: Diagnosis not present

## 2016-09-21 DIAGNOSIS — N186 End stage renal disease: Secondary | ICD-10-CM | POA: Diagnosis not present

## 2016-09-21 DIAGNOSIS — Z4781 Encounter for orthopedic aftercare following surgical amputation: Secondary | ICD-10-CM | POA: Diagnosis not present

## 2016-09-21 DIAGNOSIS — I132 Hypertensive heart and chronic kidney disease with heart failure and with stage 5 chronic kidney disease, or end stage renal disease: Secondary | ICD-10-CM | POA: Diagnosis not present

## 2016-09-22 DIAGNOSIS — D631 Anemia in chronic kidney disease: Secondary | ICD-10-CM | POA: Diagnosis not present

## 2016-09-22 DIAGNOSIS — E119 Type 2 diabetes mellitus without complications: Secondary | ICD-10-CM | POA: Diagnosis not present

## 2016-09-22 DIAGNOSIS — D500 Iron deficiency anemia secondary to blood loss (chronic): Secondary | ICD-10-CM | POA: Diagnosis not present

## 2016-09-22 DIAGNOSIS — N186 End stage renal disease: Secondary | ICD-10-CM | POA: Diagnosis not present

## 2016-09-22 DIAGNOSIS — N2581 Secondary hyperparathyroidism of renal origin: Secondary | ICD-10-CM | POA: Diagnosis not present

## 2016-09-22 DIAGNOSIS — L03115 Cellulitis of right lower limb: Secondary | ICD-10-CM | POA: Diagnosis not present

## 2016-09-25 DIAGNOSIS — D500 Iron deficiency anemia secondary to blood loss (chronic): Secondary | ICD-10-CM | POA: Diagnosis not present

## 2016-09-25 DIAGNOSIS — D631 Anemia in chronic kidney disease: Secondary | ICD-10-CM | POA: Diagnosis not present

## 2016-09-25 DIAGNOSIS — N186 End stage renal disease: Secondary | ICD-10-CM | POA: Diagnosis not present

## 2016-09-25 DIAGNOSIS — L03115 Cellulitis of right lower limb: Secondary | ICD-10-CM | POA: Diagnosis not present

## 2016-09-25 DIAGNOSIS — E119 Type 2 diabetes mellitus without complications: Secondary | ICD-10-CM | POA: Diagnosis not present

## 2016-09-25 DIAGNOSIS — N2581 Secondary hyperparathyroidism of renal origin: Secondary | ICD-10-CM | POA: Diagnosis not present

## 2016-09-29 DIAGNOSIS — N2581 Secondary hyperparathyroidism of renal origin: Secondary | ICD-10-CM | POA: Diagnosis not present

## 2016-09-29 DIAGNOSIS — N186 End stage renal disease: Secondary | ICD-10-CM | POA: Diagnosis not present

## 2016-09-29 DIAGNOSIS — D631 Anemia in chronic kidney disease: Secondary | ICD-10-CM | POA: Diagnosis not present

## 2016-09-29 DIAGNOSIS — L03115 Cellulitis of right lower limb: Secondary | ICD-10-CM | POA: Diagnosis not present

## 2016-09-29 DIAGNOSIS — E119 Type 2 diabetes mellitus without complications: Secondary | ICD-10-CM | POA: Diagnosis not present

## 2016-09-29 DIAGNOSIS — D500 Iron deficiency anemia secondary to blood loss (chronic): Secondary | ICD-10-CM | POA: Diagnosis not present

## 2016-10-06 DIAGNOSIS — D500 Iron deficiency anemia secondary to blood loss (chronic): Secondary | ICD-10-CM | POA: Diagnosis not present

## 2016-10-06 DIAGNOSIS — L03115 Cellulitis of right lower limb: Secondary | ICD-10-CM | POA: Diagnosis not present

## 2016-10-06 DIAGNOSIS — D631 Anemia in chronic kidney disease: Secondary | ICD-10-CM | POA: Diagnosis not present

## 2016-10-06 DIAGNOSIS — E119 Type 2 diabetes mellitus without complications: Secondary | ICD-10-CM | POA: Diagnosis not present

## 2016-10-06 DIAGNOSIS — N186 End stage renal disease: Secondary | ICD-10-CM | POA: Diagnosis not present

## 2016-10-06 DIAGNOSIS — N2581 Secondary hyperparathyroidism of renal origin: Secondary | ICD-10-CM | POA: Diagnosis not present

## 2016-10-08 DIAGNOSIS — N2581 Secondary hyperparathyroidism of renal origin: Secondary | ICD-10-CM | POA: Diagnosis not present

## 2016-10-08 DIAGNOSIS — N186 End stage renal disease: Secondary | ICD-10-CM | POA: Diagnosis not present

## 2016-10-08 DIAGNOSIS — D631 Anemia in chronic kidney disease: Secondary | ICD-10-CM | POA: Diagnosis not present

## 2016-10-08 DIAGNOSIS — L03115 Cellulitis of right lower limb: Secondary | ICD-10-CM | POA: Diagnosis not present

## 2016-10-08 DIAGNOSIS — E119 Type 2 diabetes mellitus without complications: Secondary | ICD-10-CM | POA: Diagnosis not present

## 2016-10-08 DIAGNOSIS — D500 Iron deficiency anemia secondary to blood loss (chronic): Secondary | ICD-10-CM | POA: Diagnosis not present

## 2016-10-09 ENCOUNTER — Ambulatory Visit: Payer: Medicare Other | Attending: Internal Medicine | Admitting: Physical Therapy

## 2016-10-09 ENCOUNTER — Encounter: Payer: Self-pay | Admitting: Physical Therapy

## 2016-10-09 ENCOUNTER — Encounter: Payer: Medicare Other | Admitting: Physical Therapy

## 2016-10-09 DIAGNOSIS — M6281 Muscle weakness (generalized): Secondary | ICD-10-CM

## 2016-10-09 DIAGNOSIS — G8929 Other chronic pain: Secondary | ICD-10-CM | POA: Insufficient documentation

## 2016-10-09 DIAGNOSIS — R293 Abnormal posture: Secondary | ICD-10-CM

## 2016-10-09 DIAGNOSIS — M6249 Contracture of muscle, multiple sites: Secondary | ICD-10-CM

## 2016-10-09 DIAGNOSIS — M545 Low back pain: Secondary | ICD-10-CM | POA: Diagnosis not present

## 2016-10-09 DIAGNOSIS — R2681 Unsteadiness on feet: Secondary | ICD-10-CM | POA: Diagnosis not present

## 2016-10-09 DIAGNOSIS — R2689 Other abnormalities of gait and mobility: Secondary | ICD-10-CM | POA: Insufficient documentation

## 2016-10-10 NOTE — Therapy (Signed)
[facility] [address] [place], [place], [address] Phone: [phone]   Fax:  [phone]  Physical Therapy Evaluation  Patient Details  Name: Deborah Hendricks MRN: [id number] Date of Birth: 05/15/1946 Referring Provider: Jana Half, PA-C  Encounter Date: 10/09/2016      PT End of Session - 10/09/16 1210    Visit Number 1   Number of Visits 33   Date for PT Re-Evaluation 12/07/16   Authorization Type Medicare G-code & progress note   PT Start Time 0828   PT Stop Time 0930   PT Time Calculation (min) 62 min   Equipment Utilized During Treatment Gait belt   Activity Tolerance Patient limited by fatigue;Patient tolerated treatment well   Behavior During Therapy [facility] for tasks assessed/performed      Past Medical History:  Diagnosis Date  . Anemia   . Anxiety   . CHF (congestive heart failure) ([place])   . Constipation   . Critical lower limb ischemia   . Depression   . Diabetes mellitus without complication ([place])   . Hypertension   . Kidney disease    ESRD  . Memory loss    since stroke 04/2015  . Peripheral vascular disease ([place])   . Seizures ([place]) 04/2015   since   . Stroke ([place]) 04/2015   unable to stand .  can have diffuuclty with speech at times    Past Surgical History:  Procedure Laterality Date  . ABDOMINAL HYSTERECTOMY    . AMPUTATION Left 12/15/2015   Procedure: LEFT ABOVE KNEE AMPUTATION;  Surgeon: Serafina Mitchell, MD;  Location: [facility];  Service: Vascular;  Laterality: Left;  . BASCILIC VEIN TRANSPOSITION Right 11/25/2015   Procedure: Right Arm FIRST STAGE BASILIC VEIN TRANSPOSITION;  Surgeon: Serafina Mitchell, MD;  Location: [facility];  Service: Vascular;  Laterality: Right;  . BASCILIC VEIN TRANSPOSITION Right 01/19/2016   Procedure: RIGHT SECOND STAGE BASILIC VEIN TRANSPOSITION;  Surgeon: Serafina Mitchell, MD;  Location: [facility];  Service: Vascular;  Laterality: Right;  . PERIPHERAL VASCULAR  CATHETERIZATION Bilateral 11/28/2015   Procedure: Lower Extremity Angiography;  Surgeon: Lorretta Harp, MD;  Location: [facility];  Service: Cardiovascular;  Laterality: Bilateral;  . PERIPHERAL VASCULAR CATHETERIZATION N/A 11/28/2015   Procedure: Abdominal Aortogram;  Surgeon: Lorretta Harp, MD;  Location: [facility];  Service: Cardiovascular;  Laterality: N/A;  . TOE AMPUTATION     R foot  . TONSILLECTOMY      There were no vitals filed for this visit.       Subjective Assessment - 10/09/16 0830    Subjective This 70yo female underwent a Left Transfemoral Amputation on 12/15/2015. She has history of 2 CVAs with residual right hemiparesis & speech deficits from 2nd in ~2014. She was in a nursing home in [place]. and her daughter had her transferred to a nursing home in [place], [place] fall of 2016. She recieved her first prosthesis in nursing home in 03/19/2016 with minimal use while there. She was discharged to her daughter's home late May 2017. Pt & dtr report she has worn prosthesis 2-3 times up to 1 hour sitting since discharge home in May. She reports to PT for evaluation with daughter and prosthesis being held by patient in her lap (not worn).    Patient is accompained by: Family member   Pertinent History Prosthetist is [employer] at [facility].   CHF, 2 CVAs, anemia, anxiety, DM, PVD, HTN,  ESRD, depression, dementia (dtr reports minor memory issues), Sz disorder (unknown last), A-Fib, angina,    Limitations Standing;Walking;House hold activities   Patient Stated Goals patient wants to use prosthesis to get around home & in/out community.    Currently in Pain? Yes   Pain Score 5    Pain Location Back   Pain Orientation Lower   Pain Descriptors / Indicators Aching;Sore   Pain Type Chronic pain   Pain Onset More than a month ago   Pain Frequency Intermittent   Aggravating Factors  standing, sitting in one position too long   Pain Relieving Factors stretches & meds    Effect of Pain on Daily Activities limits staying one position;  PT will monitor with activities but not directly address   Multiple Pain Sites No            [facility] PT Assessment - 10/09/16 0830      Assessment   Medical Diagnosis Left Transfemoral Amputation   Referring Provider Max Irean Hong, PA-C   Onset Date/Surgical Date 03/19/16  prosthesis delivery   Hand Dominance Left   Prior [facility], nsg home OT & PT     Precautions   Precautions Fall   Precaution Comments NO BP RUE     Restrictions   Weight Bearing Restrictions No     Balance Screen   Has the patient fallen in the past 6 months Yes   How many times? 3  during transfers, no injuries   Has the patient had a decrease in activity level because of a fear of falling?  No   Is the patient reluctant to leave their home because of a fear of falling?  No  in w/c     Home Environment   Living Environment Private residence   [place];Other relatives  grandson/ girlfriend   Type of [place] entrance;Level entry   Home Layout One level   Home Equipment Bedside commode;Grab bars - tub/shower;Wheelchair - manual;Hospital bed     Prior Function   Level of Independence Independent;Independent with household mobility with device;Independent with community mobility with device  prior to 2nd CVA indep. w/RW, inconsistent since      Observation/Other Assessments   Focus on Therapeutic Outcomes (FOTO)  38.43 Functional Status   Activities of Balance Confidence Scale (ABC Scale)  7.5%   Fear Avoidance Belief Questionnaire (FABQ)  26 (8)     Coordination   Gross Motor Movements are Fluid and Coordinated No   Fine Motor Movements are Fluid and Coordinated No     Posture/Postural Control   Posture/Postural Control Postural limitations   Postural Limitations Rounded Shoulders;Forward head;Increased lumbar lordosis;Flexed trunk;Weight shift right  right knee & hip  flexed in standing     Tone   Assessment Location Right Upper Extremity;Right Lower Extremity     ROM / Strength   AROM / PROM / Strength Strength;PROM     PROM   Overall PROM  Deficits   Overall PROM Comments Right shoulder ~100* flexion, Left UE WFL   PROM Assessment Site Hip;Knee;Ankle   Right/Left Hip Right;Left   Left Hip Extension -25   Left Hip Flexion 95   Left Hip ABduction 10   Right/Left Knee Right   Right Knee Extension -31  supine   Right/Left Ankle Right   Right Ankle Dorsiflexion -12     Strength   Overall Strength Deficits   Overall Strength Comments RUE shoulder grossly  3-/5, elbow 4/5, grip poor;  LUE grossly 5/5   Strength Assessment Site Hip;Knee;Ankle   Right/Left Hip Right;Left   Right Hip Flexion 3-/5   Right Hip Extension 2/5   Right Hip ABduction 2-/5   Left Hip Flexion 3/5   Left Hip Extension 3-/5   Left Hip ABduction 3-/5   Right/Left Knee Right   Right Knee Flexion 3-/5   Right Knee Extension 3-/5   Right/Left Ankle Right   Right Ankle Dorsiflexion 3-/5   Right Ankle Plantar Flexion 2-/5     Right Hip   Right Hip Extension -26   Right Hip Flexion 95     Transfers   Transfers Sit to Stand;Stand to Sit;Lateral/Scoot Transfers;Sit to Supine;Supine to Sit   Sit to Stand 1: +2 Total assist;With upper extremity assist;From chair/3-in-1;With armrests  pulling on parallel bars & PT manually locking prosthesis    Stand to Sit 2: Max assist;With upper extremity assist;With armrests;To chair/3-in-1  holding parallel bars to control descent & PT managing prost   Lateral/Scoot Transfers 6: Modified independent (Device/Increase time);With armrests removed  no prosthesis present   Supine to Sit 4: Min assist;With upper extremity assist  uses UEs, pt reports mod indep from hospital bed head elevat   Sit to Supine 6: Modified independent (Device/Increase time);With upper extremity assist;Uncontrolled descent     Ambulation/Gait   Ambulation/Gait No    Ambulation/Gait Assistance --   Ambulation/Gait Assistance Details standing total assist and unable to engage prosthetic suspension pin so unable to assess ambulation at eval.      Balance   Balance Assessed Yes     Static Standing Balance   Static Standing - Balance Support Bilateral upper extremity supported   Static Standing - Level of Assistance 2: Max assist  2 people for safety, initially +2 total, COG over BOS maxA   Static Standing - Comment/# of Minutes 2 minutes, PT manually controlled prosthesis in extension     Dynamic Standing Balance   Dynamic Standing - Balance Support Bilateral upper extremity supported   Dynamic Standing - Level of Assistance 1: +2 Total assist   Dynamic Standing - Balance Activities Head nods;Head turns   Dynamic Standing - Comments unable to release bar to reach     RUE Tone   RUE Tone Within Functional Limits     RLE Tone   RLE Tone Within Functional Limits         Prosthetics Assessment - 10/09/16 0830      Prosthetics   Prosthetic Care Dependent with Skin check;Residual limb care;Care of non-amputated limb;Prosthetic cleaning;Ply sock cleaning;Correct ply sock adjustment;Proper wear schedule/adjustment;Proper weight-bearing schedule/adjustment   Donning prosthesis  Max assist   Doffing prosthesis  Mod assist   Current prosthetic wear tolerance (days/week)  worn 2-3 times since d/c SNF 6 months ago   Current prosthetic wear tolerance (#hours/day)  reports up to 1 hr but uncertain   Current prosthetic weight-bearing tolerance (hours/day)  Pt tolerated standing 2 minutes in parallel bars with no c/o limb pain or back pain.    Edema pitting edema in residual limb; reports did not wear shrinker   Residual limb condition  multiple (10-15) 26mm scabs from healed or healing folliculitis, cylinderical shape, dry skin, no hair growth, normal color.    K code/activity level with prosthetic use  K1 household ambulator                   [facility] Adult PT Treatment/Exercise - 10/09/16 0830  Prosthetics   Education Provided Skin check;Residual limb care;Prosthetic cleaning;Proper Donning  wear liner only 2-3 hrs 2x/day   Person(s) Educated Patient;Child(ren)   Education Method Explanation;Demonstration;Tactile cues;Verbal cues   Education Method Verbalized understanding;Returned demonstration;Tactile cues required;Verbal cues required;Needs further instruction                  PT Short Term Goals - 10/09/16 1230      PT SHORT TERM GOAL #1   Title Patient demonstrates & verbalizes understanding of initial HEP to address ROM, strength & balance deficits. (Target Date: 11/08/2016)   Time 1   Period Months   Status New     PT SHORT TERM GOAL #2   Title Patient tolerates wear of prosthetic liner daily for 6 hrs total and prosthesis for 3 hrs on non-dialysis days without skin issues or limb tenderness. (Target Date: 11/08/2016)   Time 1   Period Months   Status New     PT SHORT TERM GOAL #3   Title Patient verbalizes proper cleaning and donnes prosthesis with modA but can direct caregiver in proper technique. (Target Date: 11/08/2016)   Time 1   Period Months   Status New     PT SHORT TERM GOAL #4   Title Sit to stand w/c to parallel bars with prosthesis with modA. (Target Date: 11/08/2016)   Time 1   Period Months   Status New     PT SHORT TERM GOAL #5   Title Pt tolerates standing in parallel bars with prosthesis with minA for 3 minutes and is able to turn head to scan environment. (Target Date: 11/08/2016)   Time 1   Period Months   Status New           PT Long Term Goals - 10/09/16 1301      PT LONG TERM GOAL #1   Title Patient and family demonstrate & verbalize proper prosthetic care to enable safe use of prosthesis. (Target Date: 02/01/2017)   Time 4   Period Months   Status New     PT LONG TERM GOAL #2   Title Patient tolerates wear of prosthesis >75% of awake hours except dialysis  to facilitate function during her day.  (Target Date: 02/01/2017)   Time 4   Period Months   Status New     PT LONG TERM GOAL #3   Title Patient performs stand-pivot transfers including sit to/from stand with RW & prosthesis with supervision.  (Target Date: 02/01/2017)   Time 4   Period Months   Status New     PT LONG TERM GOAL #4   Title Patient standing balance with RW & prosthesis reaching 5" anteriorly and manages pants for toileting with supervision.  (Target Date: 02/01/2017)   Time 4   Period Months   Status New     PT LONG TERM GOAL #5   Title Patient ambulates 89' with RW & prosthesis with minA from family safely.  (Target Date: 02/01/2017)   Time 4   Period Months   Status New     Additional Long Term Goals   Additional Long Term Goals Yes     PT LONG TERM GOAL #6   Title Patient demonstrates & verbalizes understanding of ongoing HEP.  (Target Date: 02/01/2017)   Time 4   Period Months   Status New               Plan - 10/09/16 1244    Clinical Impression Statement  This 70yo female recieved a prosthesis 7 months ago while in SNF and has not recieved any training so unable to use the prosthesis including proper care. Family & pt are unknowledgeable in proper care and only worn 2-3 times up to 1 hr over last 6 months. Per daughter, she was getting inconsistent care in SNF in [facility] so she was not ambulatory without assistance since 2nd CVA in 2015. Patient requires 2 people to transfer sit to/from stand pulling on mounted parallel bars. She required maxA (2 people) to establish static position & maxA to maintain including PT manually locking / controlling prosthesis. She was unable to release bar to reach in standing even with support. Pt's limb was too edematous to seat limb into socket to engage suspension pin. Pt was unable to extend right knee & hip making prosthesis too long at this point. She also needs manual lock added to prosthesis to enable stability & control and  second liner trimmed & pin added. PT called prosthetist who is setting up appt with pt to address. Patient's condition is evolving and high complexity of care. Patient needs skilled care to progress mobility & safety with prosthesis.     Rehab Potential Good   PT Frequency 2x / week   PT Duration Other (comment)  4 months (16 weeks)   PT Treatment/Interventions ADLs/Self Care Home Management;Moist Heat;DME Instruction;Gait training;Stair training;Functional mobility training;Therapeutic activities;Therapeutic exercise;Balance training;Neuromuscular re-education;Patient/family education;Prosthetic Training;Orthotic Fit/Training   PT Next Visit Plan set up HEP for RLE stretches for hip, knee & ankle, review prosthetic care, working on standing in parallel bars   Consulted and Agree with Plan of Care Patient;Family member/caregiver   Family Member Consulted dtr      Patient will benefit from skilled therapeutic intervention in order to improve the following deficits and impairments:  Abnormal gait, Decreased activity tolerance, Decreased balance, Decreased coordination, Decreased endurance, Decreased knowledge of precautions, Decreased knowledge of use of DME, Decreased mobility, Decreased range of motion, Decreased strength, Impaired flexibility, Postural dysfunction, Prosthetic Dependency, Obesity, Pain  Visit Diagnosis: Muscle weakness (generalized)  Unsteadiness on feet  Chronic midline low back pain without sciatica  Contracture of muscle, multiple sites  Abnormal posture  Other abnormalities of gait and mobility      G-Codes - Oct 10, 2016 1027    Functional Assessment Tool Used Patient & family are dependent in prosthetic care. MaxA donning & verbal cues. Worn 3 times in 6 months.    Functional Limitation Self care   Self Care Current Status [phone]) 100 percent impaired, limited or restricted   Self Care Goal Status [id number]) At least 20 percent but less than 40 percent impaired,  limited or restricted       Problem List Patient Active Problem List   Diagnosis Date Noted  . Blurry vision   . Visual changes 07/26/2016  . Asymmetric SNHL (sensorineural hearing loss) 06/12/2016  . Essential hypertension 06/08/2016  . History of pulmonary embolism 06/08/2016  . History of stroke 06/08/2016  . History of borderline personality disorder 04/19/2016  . History of medication noncompliance 04/19/2016  . Paroxysmal atrial fibrillation ([place]) 04/19/2016  . Patient is Jehovah's Witness 04/19/2016  . Seizure disorder ([place]) 04/19/2016  . Renovascular hypertension 04/13/2016  . ESRD on dialysis ([place]) 04/11/2016  . Generalized anxiety disorder 04/11/2016  . Major depression, recurrent, chronic ([place]) 04/11/2016  . S/P AKA (above knee amputation) unilateral ([place]) 04/11/2016  . PAD (peripheral artery disease) ([place]) 12/15/2015  . Critical lower limb ischemia 11/16/2015  .  Type 2 diabetes mellitus with left diabetic foot ulcer ([place]) 10/21/2015  . Type II diabetes mellitus with neurological manifestations ([place]) 10/21/2015    Ventura Leggitt PT, DPT 10/10/2016, 10:34 AM  [facility] [address] [place], [place], [address] Phone: [phone]   Fax:  [phone]  Name: Catalia Lerew MRN: [id number] Date of Birth: 07-Dec-1945

## 2016-10-11 DIAGNOSIS — Z992 Dependence on renal dialysis: Secondary | ICD-10-CM | POA: Diagnosis not present

## 2016-10-11 DIAGNOSIS — D631 Anemia in chronic kidney disease: Secondary | ICD-10-CM | POA: Diagnosis not present

## 2016-10-11 DIAGNOSIS — D500 Iron deficiency anemia secondary to blood loss (chronic): Secondary | ICD-10-CM | POA: Diagnosis not present

## 2016-10-11 DIAGNOSIS — N186 End stage renal disease: Secondary | ICD-10-CM | POA: Diagnosis not present

## 2016-10-11 DIAGNOSIS — L03115 Cellulitis of right lower limb: Secondary | ICD-10-CM | POA: Diagnosis not present

## 2016-10-11 DIAGNOSIS — E119 Type 2 diabetes mellitus without complications: Secondary | ICD-10-CM | POA: Diagnosis not present

## 2016-10-11 DIAGNOSIS — I120 Hypertensive chronic kidney disease with stage 5 chronic kidney disease or end stage renal disease: Secondary | ICD-10-CM | POA: Diagnosis not present

## 2016-10-11 DIAGNOSIS — N2581 Secondary hyperparathyroidism of renal origin: Secondary | ICD-10-CM | POA: Diagnosis not present

## 2016-10-12 DIAGNOSIS — S68119A Complete traumatic metacarpophalangeal amputation of unspecified finger, initial encounter: Secondary | ICD-10-CM | POA: Diagnosis not present

## 2016-10-12 DIAGNOSIS — L989 Disorder of the skin and subcutaneous tissue, unspecified: Secondary | ICD-10-CM | POA: Diagnosis not present

## 2016-10-12 DIAGNOSIS — E1140 Type 2 diabetes mellitus with diabetic neuropathy, unspecified: Secondary | ICD-10-CM | POA: Diagnosis not present

## 2016-10-13 DIAGNOSIS — E119 Type 2 diabetes mellitus without complications: Secondary | ICD-10-CM | POA: Diagnosis not present

## 2016-10-13 DIAGNOSIS — N186 End stage renal disease: Secondary | ICD-10-CM | POA: Diagnosis not present

## 2016-10-13 DIAGNOSIS — D631 Anemia in chronic kidney disease: Secondary | ICD-10-CM | POA: Diagnosis not present

## 2016-10-13 DIAGNOSIS — E875 Hyperkalemia: Secondary | ICD-10-CM | POA: Diagnosis not present

## 2016-10-13 DIAGNOSIS — N2581 Secondary hyperparathyroidism of renal origin: Secondary | ICD-10-CM | POA: Diagnosis not present

## 2016-10-16 DIAGNOSIS — E875 Hyperkalemia: Secondary | ICD-10-CM | POA: Diagnosis not present

## 2016-10-16 DIAGNOSIS — E119 Type 2 diabetes mellitus without complications: Secondary | ICD-10-CM | POA: Diagnosis not present

## 2016-10-16 DIAGNOSIS — D631 Anemia in chronic kidney disease: Secondary | ICD-10-CM | POA: Diagnosis not present

## 2016-10-16 DIAGNOSIS — N2581 Secondary hyperparathyroidism of renal origin: Secondary | ICD-10-CM | POA: Diagnosis not present

## 2016-10-16 DIAGNOSIS — N186 End stage renal disease: Secondary | ICD-10-CM | POA: Diagnosis not present

## 2016-10-17 ENCOUNTER — Ambulatory Visit: Payer: Medicare Other | Admitting: Physical Therapy

## 2016-10-18 DIAGNOSIS — N2581 Secondary hyperparathyroidism of renal origin: Secondary | ICD-10-CM | POA: Diagnosis not present

## 2016-10-18 DIAGNOSIS — E119 Type 2 diabetes mellitus without complications: Secondary | ICD-10-CM | POA: Diagnosis not present

## 2016-10-18 DIAGNOSIS — N186 End stage renal disease: Secondary | ICD-10-CM | POA: Diagnosis not present

## 2016-10-18 DIAGNOSIS — D631 Anemia in chronic kidney disease: Secondary | ICD-10-CM | POA: Diagnosis not present

## 2016-10-18 DIAGNOSIS — E875 Hyperkalemia: Secondary | ICD-10-CM | POA: Diagnosis not present

## 2016-10-19 DIAGNOSIS — I739 Peripheral vascular disease, unspecified: Secondary | ICD-10-CM | POA: Diagnosis not present

## 2016-10-20 DIAGNOSIS — N2581 Secondary hyperparathyroidism of renal origin: Secondary | ICD-10-CM | POA: Diagnosis not present

## 2016-10-20 DIAGNOSIS — E119 Type 2 diabetes mellitus without complications: Secondary | ICD-10-CM | POA: Diagnosis not present

## 2016-10-20 DIAGNOSIS — D631 Anemia in chronic kidney disease: Secondary | ICD-10-CM | POA: Diagnosis not present

## 2016-10-20 DIAGNOSIS — N186 End stage renal disease: Secondary | ICD-10-CM | POA: Diagnosis not present

## 2016-10-20 DIAGNOSIS — E875 Hyperkalemia: Secondary | ICD-10-CM | POA: Diagnosis not present

## 2016-10-22 ENCOUNTER — Encounter: Payer: Medicare Other | Admitting: Physical Therapy

## 2016-10-23 DIAGNOSIS — E119 Type 2 diabetes mellitus without complications: Secondary | ICD-10-CM | POA: Diagnosis not present

## 2016-10-23 DIAGNOSIS — N186 End stage renal disease: Secondary | ICD-10-CM | POA: Diagnosis not present

## 2016-10-23 DIAGNOSIS — E875 Hyperkalemia: Secondary | ICD-10-CM | POA: Diagnosis not present

## 2016-10-23 DIAGNOSIS — N2581 Secondary hyperparathyroidism of renal origin: Secondary | ICD-10-CM | POA: Diagnosis not present

## 2016-10-23 DIAGNOSIS — D631 Anemia in chronic kidney disease: Secondary | ICD-10-CM | POA: Diagnosis not present

## 2016-10-24 ENCOUNTER — Other Ambulatory Visit (HOSPITAL_COMMUNITY): Payer: Self-pay | Admitting: Foot & Ankle Surgery

## 2016-10-24 DIAGNOSIS — I739 Peripheral vascular disease, unspecified: Secondary | ICD-10-CM

## 2016-10-25 ENCOUNTER — Ambulatory Visit (HOSPITAL_COMMUNITY)
Admission: RE | Admit: 2016-10-25 | Discharge: 2016-10-25 | Disposition: A | Discharge status: Home / Self Care | Payer: Medicare Other | Source: Ambulatory Visit | Attending: Cardiology | Admitting: Cardiology

## 2016-10-25 DIAGNOSIS — Z89421 Acquired absence of other right toe(s): Secondary | ICD-10-CM | POA: Diagnosis not present

## 2016-10-25 DIAGNOSIS — Z89612 Acquired absence of left leg above knee: Secondary | ICD-10-CM | POA: Insufficient documentation

## 2016-10-25 DIAGNOSIS — D631 Anemia in chronic kidney disease: Secondary | ICD-10-CM | POA: Diagnosis not present

## 2016-10-25 DIAGNOSIS — I739 Peripheral vascular disease, unspecified: Secondary | ICD-10-CM

## 2016-10-25 DIAGNOSIS — E1151 Type 2 diabetes mellitus with diabetic peripheral angiopathy without gangrene: Secondary | ICD-10-CM | POA: Insufficient documentation

## 2016-10-25 DIAGNOSIS — N2581 Secondary hyperparathyroidism of renal origin: Secondary | ICD-10-CM | POA: Diagnosis not present

## 2016-10-25 DIAGNOSIS — E875 Hyperkalemia: Secondary | ICD-10-CM | POA: Diagnosis not present

## 2016-10-25 DIAGNOSIS — N186 End stage renal disease: Secondary | ICD-10-CM | POA: Diagnosis not present

## 2016-10-25 DIAGNOSIS — E119 Type 2 diabetes mellitus without complications: Secondary | ICD-10-CM | POA: Diagnosis not present

## 2016-10-26 ENCOUNTER — Encounter: Payer: Medicare Other | Admitting: Physical Therapy

## 2016-10-26 ENCOUNTER — Ambulatory Visit: Payer: Medicare Other | Admitting: Cardiovascular Disease

## 2016-10-27 DIAGNOSIS — D631 Anemia in chronic kidney disease: Secondary | ICD-10-CM | POA: Diagnosis not present

## 2016-10-27 DIAGNOSIS — N186 End stage renal disease: Secondary | ICD-10-CM | POA: Diagnosis not present

## 2016-10-27 DIAGNOSIS — N2581 Secondary hyperparathyroidism of renal origin: Secondary | ICD-10-CM | POA: Diagnosis not present

## 2016-10-27 DIAGNOSIS — E875 Hyperkalemia: Secondary | ICD-10-CM | POA: Diagnosis not present

## 2016-10-27 DIAGNOSIS — E119 Type 2 diabetes mellitus without complications: Secondary | ICD-10-CM | POA: Diagnosis not present

## 2016-10-29 ENCOUNTER — Encounter: Payer: Medicare Other | Admitting: Physical Therapy

## 2016-10-30 DIAGNOSIS — N186 End stage renal disease: Secondary | ICD-10-CM | POA: Diagnosis not present

## 2016-10-30 DIAGNOSIS — D631 Anemia in chronic kidney disease: Secondary | ICD-10-CM | POA: Diagnosis not present

## 2016-10-30 DIAGNOSIS — E119 Type 2 diabetes mellitus without complications: Secondary | ICD-10-CM | POA: Diagnosis not present

## 2016-10-30 DIAGNOSIS — N2581 Secondary hyperparathyroidism of renal origin: Secondary | ICD-10-CM | POA: Diagnosis not present

## 2016-10-30 DIAGNOSIS — E875 Hyperkalemia: Secondary | ICD-10-CM | POA: Diagnosis not present

## 2016-10-31 ENCOUNTER — Encounter: Payer: Self-pay | Admitting: Physical Therapy

## 2016-10-31 ENCOUNTER — Encounter: Payer: Medicare Other | Admitting: Physical Therapy

## 2016-10-31 NOTE — Therapy (Signed)
[facility] [address] [place], [place], [address] Phone: [phone]   Fax:  [phone]  Patient Details  Name: Deborah Hendricks MRN: [id number] Date of Birth: 1946/07/10 Referring Provider:  Jana Half, PA-C   Encounter Date: 10/31/2016  PHYSICAL THERAPY DISCHARGE SUMMARY  Visits from Start of Care: 1  Current functional level related to goals / functional outcomes: Patient's daughter called to report that she was going to doctor due to wounds on non-amputated limb. She cancelled all PT appointments until they can determine status of that limb.    Remaining deficits: PT only saw for evaluation.    Education / Equipment: None as eval only  Plan: Patient agrees to discharge.  Patient goals were not met. Patient is being discharged due to the patient's request.  ?????          Emmilee Reamer PT, DPT 10/31/2016, 3:24 PM  [facility] [address] [place], [place], [address] Phone: [phone]   Fax:  [phone]

## 2016-11-01 DIAGNOSIS — E875 Hyperkalemia: Secondary | ICD-10-CM | POA: Diagnosis not present

## 2016-11-01 DIAGNOSIS — E119 Type 2 diabetes mellitus without complications: Secondary | ICD-10-CM | POA: Diagnosis not present

## 2016-11-01 DIAGNOSIS — N2581 Secondary hyperparathyroidism of renal origin: Secondary | ICD-10-CM | POA: Diagnosis not present

## 2016-11-01 DIAGNOSIS — N186 End stage renal disease: Secondary | ICD-10-CM | POA: Diagnosis not present

## 2016-11-01 DIAGNOSIS — D631 Anemia in chronic kidney disease: Secondary | ICD-10-CM | POA: Diagnosis not present

## 2016-11-03 DIAGNOSIS — N186 End stage renal disease: Secondary | ICD-10-CM | POA: Diagnosis not present

## 2016-11-03 DIAGNOSIS — D631 Anemia in chronic kidney disease: Secondary | ICD-10-CM | POA: Diagnosis not present

## 2016-11-03 DIAGNOSIS — N2581 Secondary hyperparathyroidism of renal origin: Secondary | ICD-10-CM | POA: Diagnosis not present

## 2016-11-03 DIAGNOSIS — E875 Hyperkalemia: Secondary | ICD-10-CM | POA: Diagnosis not present

## 2016-11-03 DIAGNOSIS — E119 Type 2 diabetes mellitus without complications: Secondary | ICD-10-CM | POA: Diagnosis not present

## 2016-11-06 DIAGNOSIS — N2581 Secondary hyperparathyroidism of renal origin: Secondary | ICD-10-CM | POA: Diagnosis not present

## 2016-11-06 DIAGNOSIS — E875 Hyperkalemia: Secondary | ICD-10-CM | POA: Diagnosis not present

## 2016-11-06 DIAGNOSIS — N186 End stage renal disease: Secondary | ICD-10-CM | POA: Diagnosis not present

## 2016-11-06 DIAGNOSIS — E119 Type 2 diabetes mellitus without complications: Secondary | ICD-10-CM | POA: Diagnosis not present

## 2016-11-06 DIAGNOSIS — D631 Anemia in chronic kidney disease: Secondary | ICD-10-CM | POA: Diagnosis not present

## 2016-11-07 ENCOUNTER — Encounter: Payer: Medicare Other | Admitting: Physical Therapy

## 2016-11-08 DIAGNOSIS — N2581 Secondary hyperparathyroidism of renal origin: Secondary | ICD-10-CM | POA: Diagnosis not present

## 2016-11-08 DIAGNOSIS — N186 End stage renal disease: Secondary | ICD-10-CM | POA: Diagnosis not present

## 2016-11-08 DIAGNOSIS — E875 Hyperkalemia: Secondary | ICD-10-CM | POA: Diagnosis not present

## 2016-11-08 DIAGNOSIS — D631 Anemia in chronic kidney disease: Secondary | ICD-10-CM | POA: Diagnosis not present

## 2016-11-08 DIAGNOSIS — E119 Type 2 diabetes mellitus without complications: Secondary | ICD-10-CM | POA: Diagnosis not present

## 2016-11-09 ENCOUNTER — Encounter: Payer: Medicare Other | Admitting: Physical Therapy

## 2016-11-10 DIAGNOSIS — D631 Anemia in chronic kidney disease: Secondary | ICD-10-CM | POA: Diagnosis not present

## 2016-11-10 DIAGNOSIS — E119 Type 2 diabetes mellitus without complications: Secondary | ICD-10-CM | POA: Diagnosis not present

## 2016-11-10 DIAGNOSIS — N2581 Secondary hyperparathyroidism of renal origin: Secondary | ICD-10-CM | POA: Diagnosis not present

## 2016-11-10 DIAGNOSIS — N186 End stage renal disease: Secondary | ICD-10-CM | POA: Diagnosis not present

## 2016-11-10 DIAGNOSIS — E875 Hyperkalemia: Secondary | ICD-10-CM | POA: Diagnosis not present

## 2016-11-11 DIAGNOSIS — Z992 Dependence on renal dialysis: Secondary | ICD-10-CM | POA: Diagnosis not present

## 2016-11-11 DIAGNOSIS — I120 Hypertensive chronic kidney disease with stage 5 chronic kidney disease or end stage renal disease: Secondary | ICD-10-CM | POA: Diagnosis not present

## 2016-11-11 DIAGNOSIS — N186 End stage renal disease: Secondary | ICD-10-CM | POA: Diagnosis not present

## 2016-11-13 DIAGNOSIS — E119 Type 2 diabetes mellitus without complications: Secondary | ICD-10-CM | POA: Diagnosis not present

## 2016-11-13 DIAGNOSIS — N186 End stage renal disease: Secondary | ICD-10-CM | POA: Diagnosis not present

## 2016-11-13 DIAGNOSIS — D631 Anemia in chronic kidney disease: Secondary | ICD-10-CM | POA: Diagnosis not present

## 2016-11-13 DIAGNOSIS — N2581 Secondary hyperparathyroidism of renal origin: Secondary | ICD-10-CM | POA: Diagnosis not present

## 2016-11-14 ENCOUNTER — Encounter: Payer: Medicare Other | Admitting: Physical Therapy

## 2016-11-15 DIAGNOSIS — D631 Anemia in chronic kidney disease: Secondary | ICD-10-CM | POA: Diagnosis not present

## 2016-11-15 DIAGNOSIS — N2581 Secondary hyperparathyroidism of renal origin: Secondary | ICD-10-CM | POA: Diagnosis not present

## 2016-11-15 DIAGNOSIS — E119 Type 2 diabetes mellitus without complications: Secondary | ICD-10-CM | POA: Diagnosis not present

## 2016-11-15 DIAGNOSIS — N186 End stage renal disease: Secondary | ICD-10-CM | POA: Diagnosis not present

## 2016-11-16 ENCOUNTER — Encounter: Payer: Medicare Other | Admitting: Physical Therapy

## 2016-11-16 ENCOUNTER — Ambulatory Visit (INDEPENDENT_AMBULATORY_CARE_PROVIDER_SITE_OTHER): Payer: Medicare Other | Admitting: Podiatry

## 2016-11-16 ENCOUNTER — Encounter: Payer: Self-pay | Admitting: Podiatry

## 2016-11-16 VITALS — Temp 97.4°F

## 2016-11-16 DIAGNOSIS — L97529 Non-pressure chronic ulcer of other part of left foot with unspecified severity: Secondary | ICD-10-CM | POA: Diagnosis not present

## 2016-11-16 DIAGNOSIS — I998 Other disorder of circulatory system: Secondary | ICD-10-CM | POA: Diagnosis not present

## 2016-11-16 DIAGNOSIS — M71071 Abscess of bursa, right ankle and foot: Secondary | ICD-10-CM | POA: Diagnosis not present

## 2016-11-16 DIAGNOSIS — I739 Peripheral vascular disease, unspecified: Secondary | ICD-10-CM | POA: Diagnosis not present

## 2016-11-16 DIAGNOSIS — R238 Other skin changes: Secondary | ICD-10-CM | POA: Diagnosis not present

## 2016-11-16 DIAGNOSIS — E11621 Type 2 diabetes mellitus with foot ulcer: Secondary | ICD-10-CM

## 2016-11-16 MED ORDER — DOXYCYCLINE HYCLATE 100 MG PO TABS: 100.0000 mg | ORAL_TABLET | Freq: Two times a day (BID) | ORAL | 0 refills | Status: DC

## 2016-11-16 NOTE — Progress Notes (Signed)
Subjective: 71 year old female presents the office today for a caregiver for concerns of blisters to her right big toe. This been ongoing for about 1 month. She is unsure how this started. She has been wearing an offloading boot since started however. She denies any increase his plantar redness. She denies any pain. Denies any systemic complaints such as fevers, chills, nausea, vomiting. No acute changes since last appointment, and no other complaints at this time.   Objective: AAO x3, NAD DP/PT pulses decreased.  Left AKA Sensation decreased with Simms Weinstein monofilament. On the medial aspect of the right first MPJ is a dried, flat, hemorrhagic bulla. There is no drainage or pus expressed. There is no swelling erythema, ascending synovitis complexes, crepitus or malodor. To the distal portion of the hallux is a fluid-filled blister without any surrounding erythema or ascending cellulitis. However upon puncturing the blistering in sterile fashion small amount of purulence was expressed. I did not the roof the blister and only drained is area. No open lesions or pre-ulcerative lesions.  No pain with calf compression, swelling, warmth, erythema  Assessment: Right hallux bulla with localized abscess right big toe  Plan: -All treatment options discussed with the patient including all alternatives, risks, complications.  -Given the bulla I did drain is in a sterile fashion. Small amount of purulence was expressed. This was sent for culture. We'll start doxycycline given the infection. Also offloading pads were dispensed and continue with the offloading boot as well. Monitor for any signs or symptoms of worsening infection to the ER medially should any occur. Also recommended follow-up evaluation with vascular surgery. She did have a recent arterial duplex performed last month. -Patient encouraged to call the office with any questions, concerns, change in symptoms.   Celesta Gentile, DPM

## 2016-11-17 DIAGNOSIS — D631 Anemia in chronic kidney disease: Secondary | ICD-10-CM | POA: Diagnosis not present

## 2016-11-17 DIAGNOSIS — N2581 Secondary hyperparathyroidism of renal origin: Secondary | ICD-10-CM | POA: Diagnosis not present

## 2016-11-17 DIAGNOSIS — N186 End stage renal disease: Secondary | ICD-10-CM | POA: Diagnosis not present

## 2016-11-17 DIAGNOSIS — E119 Type 2 diabetes mellitus without complications: Secondary | ICD-10-CM | POA: Diagnosis not present

## 2016-11-19 ENCOUNTER — Encounter: Payer: Medicare Other | Admitting: Physical Therapy

## 2016-11-19 LAB — WOUND CULTURE
GRAM STAIN: NONE SEEN
Gram Stain: NONE SEEN
Organism ID, Bacteria: NO GROWTH

## 2016-11-20 DIAGNOSIS — D631 Anemia in chronic kidney disease: Secondary | ICD-10-CM | POA: Diagnosis not present

## 2016-11-20 DIAGNOSIS — E119 Type 2 diabetes mellitus without complications: Secondary | ICD-10-CM | POA: Diagnosis not present

## 2016-11-20 DIAGNOSIS — N2581 Secondary hyperparathyroidism of renal origin: Secondary | ICD-10-CM | POA: Diagnosis not present

## 2016-11-20 DIAGNOSIS — N186 End stage renal disease: Secondary | ICD-10-CM | POA: Diagnosis not present

## 2016-11-21 ENCOUNTER — Encounter: Payer: Medicare Other | Admitting: Physical Therapy

## 2016-11-22 DIAGNOSIS — D631 Anemia in chronic kidney disease: Secondary | ICD-10-CM | POA: Diagnosis not present

## 2016-11-22 DIAGNOSIS — N186 End stage renal disease: Secondary | ICD-10-CM | POA: Diagnosis not present

## 2016-11-22 DIAGNOSIS — N2581 Secondary hyperparathyroidism of renal origin: Secondary | ICD-10-CM | POA: Diagnosis not present

## 2016-11-22 DIAGNOSIS — E119 Type 2 diabetes mellitus without complications: Secondary | ICD-10-CM | POA: Diagnosis not present

## 2016-11-23 ENCOUNTER — Other Ambulatory Visit: Payer: Self-pay

## 2016-11-23 ENCOUNTER — Ambulatory Visit (INDEPENDENT_AMBULATORY_CARE_PROVIDER_SITE_OTHER): Payer: Medicare Other | Admitting: Surgery

## 2016-11-23 ENCOUNTER — Encounter: Payer: Self-pay | Admitting: Surgery

## 2016-11-23 VITALS — BP 143/77 | HR 67 | Temp 97.7°F | Resp 18 | Ht 64.0 in | Wt 150.0 lb

## 2016-11-23 DIAGNOSIS — L97911 Non-pressure chronic ulcer of unspecified part of right lower leg limited to breakdown of skin: Secondary | ICD-10-CM

## 2016-11-23 NOTE — Progress Notes (Signed)
Vascular and Vein Specialist of [facility]  Patient name: Deborah Hendricks MRN: [id number] DOB: 03-Sep-1946 Sex: female  REASON FOR VISIT: right leg ulcer  HPI: Deborah Hendricks is a 71 y.o. female who returns today for evaluation of a right leg ulcer.  This has been present for many weeks.  She is status post left above-knee amputation on 12/15/2015.  She also suffers from end-stage renal disease.  She has undergone a basilic vein procedure which has not developed.  She has a catheter in her right thigh.  He has a history of stroke.  She is on anticoagulation.  She suffers from diabetes with multiple complications.  Past Medical History:  Diagnosis Date  . Anemia   . Anxiety   . CHF (congestive heart failure) ([place])   . Constipation   . Critical lower limb ischemia   . Depression   . Diabetes mellitus without complication ([place])   . Hypertension   . Kidney disease    ESRD  . Memory loss    since stroke 04/2015  . Peripheral vascular disease ([place])   . Seizures ([place]) 04/2015   since   . Stroke ([place]) 04/2015   unable to stand .  can have diffuuclty with speech at times    Family History  Problem Relation Age of Onset  . Asthma Mother   . Cancer Sister     SOCIAL HISTORY: Social History  Substance Use Topics  . Smoking status: Former Smoker    Quit date: 11/17/2013  . Smokeless tobacco: Never Used  . Alcohol use No    Allergies  Allergen Reactions  . Penicillins Hives, Itching and Rash    Has patient had a PCN reaction causing immediate rash, facial/tongue/throat swelling, SOB or lightheadedness with hypotension: Yes Has patient had a PCN reaction causing severe rash involving mucus membranes or skin necrosis:  Has patient had a PCN reaction that required hospitalization  Has patient had a PCN reaction occurring within the last 10 years:  If all of the above answers are "NO", then may proceed with Cephalosporin use.  [name] Amoxicillin Other (See  Comments)    Unspecified Per MAR  . Latex Other (See Comments)    Unspecified Per MAR  . Sulfa Antibiotics Other (See Comments)    Unspecified Per [facility]    Current Outpatient Prescriptions  Medication Sig Dispense Refill  . apixaban (ELIQUIS) 5 MG TABS tablet Take 5 mg by mouth daily.    [name] aspirin EC 81 MG tablet Take 1 tablet (81 mg total) by mouth daily. 60 tablet 1  . calcium acetate (PHOSLO) 667 MG capsule Take 667 mg by mouth 3 (three) times daily with meals.    . carvedilol (COREG) 3.125 MG tablet Take 1 tablet (3.125 mg total) by mouth 2 (two) times daily with a meal. 60 tablet 1  . doxycycline (VIBRA-TABS) 100 MG tablet Take 1 tablet (100 mg total) by mouth 2 (two) times daily. 20 tablet 0  . gabapentin (NEURONTIN) 100 MG capsule Take 100 mg by mouth 3 (three) times daily.    . insulin aspart (NOVOLOG) 100 UNIT/ML injection Inject 2-10 Units into the skin 3 (three) times daily before meals. 151-200=2 units, 201-250=4 units, 251-300=6 units, 301-350=8 units, 351-400=10 units, >400=10 units and Call MD    . multivitamin (RENA-VIT) TABS tablet Take 1 tablet by mouth daily.    . Nutritional Supplements (FEEDING SUPPLEMENT, NEPRO CARB STEADY,) LIQD Take 237 mLs by mouth 2 (two) times daily between meals.    [name]  ondansetron (ZOFRAN) 4 MG tablet Take 4 mg by mouth every 8 (eight) hours as needed for nausea or vomiting.    . pantoprazole (PROTONIX) 40 MG tablet Take 1 tablet (40 mg total) by mouth daily. 30 tablet 1   No current facility-administered medications for this visit.     REVIEW OF SYSTEMS:  [X]  denotes positive finding, [ ]  denotes negative finding Cardiac  Comments:  Chest pain or chest pressure:    Shortness of breath upon exertion:    Short of breath when lying flat:    Irregular heart rhythm:        Vascular    Pain in calf, thigh, or hip brought on by ambulation:    Pain in feet at night that wakes you up from your sleep:     Blood clot in your veins:    Leg swelling:          Pulmonary    Oxygen at home:    Productive cough:     Wheezing:         Neurologic    Sudden weakness in arms or legs:     Sudden numbness in arms or legs:     Sudden onset of difficulty speaking or slurred speech:    Temporary loss of vision in one eye:     Problems with dizziness:         Gastrointestinal    Blood in stool:     Vomited blood:         Genitourinary    Burning when urinating:     Blood in urine:        Psychiatric    Major depression:         Hematologic    Bleeding problems:    Problems with blood clotting too easily:        Skin    Rashes or ulcers: x       Constitutional    Fever or chills:      PHYSICAL EXAM: Vitals:   11/23/16 1154  BP: (!) 143/77  Pulse: 67  Resp: 18  Temp: 97.7 F (36.5 C)  TempSrc: Oral  Weight: 150 lb (68 kg)  Height: 5\' 4"  (1.626 m)    GENERAL: The patient is a well-nourished female, in no acute distress. The vital signs are documented above. CARDIAC: There is a regular rate and rhythm.  VASCULAR: No palpable pedal pulses on the right PULMONARY: Non-labored respirations MUSCULOSKELETAL: Left above-knee amputation.  Amputation of right toes 2 through 5. NEUROLOGIC: No focal weakness or paresthesias are detected. SKIN: There is an ulcer at the tip of the right great toe as well as at the metatarsal head and on her knee at the level of the patella. PSYCHIATRIC: The patient has a normal affect.  DATA:  Doppler studies show: 50-74% focal stenosis in the right proximal SFA; heavy calcification may be obscuring stenosis. 30-49% stenosis in the right popliteal artery. One vessel run-off via right peroneal artery.  Occluded right anterior and posterior tibial,  MEDICAL ISSUES: #1: Right foot ulcer: Patient will be scheduled for angiography this Tuesday, January 16.  She potentially would be a candidate for SFA/popliteal/peroneal intervention.  She will need to be off of her Eliquis.    #2: End-stage renal  disease: She currently dialyzes through a right thigh catheter.  I had previously done a right basilic vein procedure.  I do not feel a thrill within her fistula.  She has an old left upper  arm graft which is occluded.  I am going to perform a central venogram on the right for access planning.      [name], MD Vascular and Vein Specialists of [facility] [phone] Pager [phone]

## 2016-11-24 DIAGNOSIS — N2581 Secondary hyperparathyroidism of renal origin: Secondary | ICD-10-CM | POA: Diagnosis not present

## 2016-11-24 DIAGNOSIS — N186 End stage renal disease: Secondary | ICD-10-CM | POA: Diagnosis not present

## 2016-11-24 DIAGNOSIS — D631 Anemia in chronic kidney disease: Secondary | ICD-10-CM | POA: Diagnosis not present

## 2016-11-24 DIAGNOSIS — E119 Type 2 diabetes mellitus without complications: Secondary | ICD-10-CM | POA: Diagnosis not present

## 2016-11-26 ENCOUNTER — Encounter: Payer: Medicare Other | Admitting: Physical Therapy

## 2016-11-26 DIAGNOSIS — N186 End stage renal disease: Secondary | ICD-10-CM | POA: Diagnosis not present

## 2016-11-26 DIAGNOSIS — N2581 Secondary hyperparathyroidism of renal origin: Secondary | ICD-10-CM | POA: Diagnosis not present

## 2016-11-26 DIAGNOSIS — E119 Type 2 diabetes mellitus without complications: Secondary | ICD-10-CM | POA: Diagnosis not present

## 2016-11-26 DIAGNOSIS — D631 Anemia in chronic kidney disease: Secondary | ICD-10-CM | POA: Diagnosis not present

## 2016-11-27 ENCOUNTER — Ambulatory Visit (HOSPITAL_COMMUNITY)
Admission: RE | Admit: 2016-11-27 | Discharge: 2016-11-28 | Disposition: A | Discharge status: Home / Self Care | Payer: Medicare Other | Source: Ambulatory Visit | Attending: Surgery | Admitting: Surgery

## 2016-11-27 ENCOUNTER — Encounter (HOSPITAL_COMMUNITY): Payer: Self-pay | Admitting: General Practice

## 2016-11-27 ENCOUNTER — Encounter (HOSPITAL_COMMUNITY)
Admission: RE | Disposition: A | Discharge status: Home / Self Care | Payer: Self-pay | Source: Ambulatory Visit | Attending: Surgery

## 2016-11-27 ENCOUNTER — Other Ambulatory Visit: Payer: Self-pay | Admitting: *Deleted

## 2016-11-27 ENCOUNTER — Telehealth: Payer: Self-pay | Admitting: Surgery

## 2016-11-27 DIAGNOSIS — Z7901 Long term (current) use of anticoagulants: Secondary | ICD-10-CM | POA: Diagnosis not present

## 2016-11-27 DIAGNOSIS — L97519 Non-pressure chronic ulcer of other part of right foot with unspecified severity: Secondary | ICD-10-CM | POA: Insufficient documentation

## 2016-11-27 DIAGNOSIS — Z9862 Peripheral vascular angioplasty status: Secondary | ICD-10-CM

## 2016-11-27 DIAGNOSIS — Z7982 Long term (current) use of aspirin: Secondary | ICD-10-CM | POA: Insufficient documentation

## 2016-11-27 DIAGNOSIS — Z89612 Acquired absence of left leg above knee: Secondary | ICD-10-CM | POA: Diagnosis not present

## 2016-11-27 DIAGNOSIS — E11621 Type 2 diabetes mellitus with foot ulcer: Secondary | ICD-10-CM | POA: Diagnosis not present

## 2016-11-27 DIAGNOSIS — N186 End stage renal disease: Secondary | ICD-10-CM | POA: Diagnosis not present

## 2016-11-27 DIAGNOSIS — Z8673 Personal history of transient ischemic attack (TIA), and cerebral infarction without residual deficits: Secondary | ICD-10-CM | POA: Diagnosis not present

## 2016-11-27 DIAGNOSIS — E1122 Type 2 diabetes mellitus with diabetic chronic kidney disease: Secondary | ICD-10-CM | POA: Insufficient documentation

## 2016-11-27 DIAGNOSIS — Z880 Allergy status to penicillin: Secondary | ICD-10-CM | POA: Insufficient documentation

## 2016-11-27 DIAGNOSIS — I132 Hypertensive heart and chronic kidney disease with heart failure and with stage 5 chronic kidney disease, or end stage renal disease: Secondary | ICD-10-CM | POA: Insufficient documentation

## 2016-11-27 DIAGNOSIS — Z992 Dependence on renal dialysis: Secondary | ICD-10-CM | POA: Insufficient documentation

## 2016-11-27 DIAGNOSIS — Z794 Long term (current) use of insulin: Secondary | ICD-10-CM | POA: Diagnosis not present

## 2016-11-27 DIAGNOSIS — E1151 Type 2 diabetes mellitus with diabetic peripheral angiopathy without gangrene: Secondary | ICD-10-CM | POA: Insufficient documentation

## 2016-11-27 DIAGNOSIS — Z87891 Personal history of nicotine dependence: Secondary | ICD-10-CM | POA: Insufficient documentation

## 2016-11-27 DIAGNOSIS — I509 Heart failure, unspecified: Secondary | ICD-10-CM | POA: Diagnosis not present

## 2016-11-27 DIAGNOSIS — I739 Peripheral vascular disease, unspecified: Secondary | ICD-10-CM | POA: Diagnosis present

## 2016-11-27 DIAGNOSIS — I70235 Atherosclerosis of native arteries of right leg with ulceration of other part of foot: Secondary | ICD-10-CM | POA: Diagnosis not present

## 2016-11-27 HISTORY — DX: Cardiac murmur, unspecified: R01.1

## 2016-11-27 HISTORY — PX: PERIPHERAL VASCULAR CATHETERIZATION: SHX172C

## 2016-11-27 HISTORY — DX: End stage renal disease: N18.6

## 2016-11-27 HISTORY — DX: End stage renal disease: Z99.2

## 2016-11-27 HISTORY — DX: Procedure and treatment not carried out because of patient's decision for reasons of belief and group pressure: Z53.1

## 2016-11-27 HISTORY — DX: Sleep apnea, unspecified: G47.30

## 2016-11-27 HISTORY — DX: Gastro-esophageal reflux disease without esophagitis: K21.9

## 2016-11-27 HISTORY — DX: Type 2 diabetes mellitus without complications: E11.9

## 2016-11-27 HISTORY — DX: Reserved for inherently not codable concepts without codable children: IMO0001

## 2016-11-27 LAB — POCT ACTIVATED CLOTTING TIME
ACTIVATED CLOTTING TIME: 296 s
ACTIVATED CLOTTING TIME: 175 s
Activated Clotting Time: 296 seconds
Activated Clotting Time: 219 seconds
Activated Clotting Time: 180 seconds

## 2016-11-27 LAB — POCT I-STAT, CHEM 8
BUN: 42 mg/dL — ABNORMAL HIGH (ref 6–20)
CHLORIDE: 103 mmol/L (ref 101–111)
Calcium, Ion: 1.17 mmol/L (ref 1.15–1.40)
Creatinine, Ser: 4 mg/dL — ABNORMAL HIGH (ref 0.44–1.00)
GLUCOSE: 87 mg/dL (ref 65–99)
HEMATOCRIT: 36 % (ref 36.0–46.0)
HEMOGLOBIN: 12.2 g/dL (ref 12.0–15.0)
POTASSIUM: 4.2 mmol/L (ref 3.5–5.1)
SODIUM: 137 mmol/L (ref 135–145)
TCO2: 28 mmol/L (ref 0–100)

## 2016-11-27 LAB — PROTIME-INR
INR: 1.11
Prothrombin Time: 14.4 seconds (ref 11.4–15.2)

## 2016-11-27 LAB — GLUCOSE, CAPILLARY
GLUCOSE-CAPILLARY: 102 mg/dL — AB (ref 65–99)
GLUCOSE-CAPILLARY: 74 mg/dL (ref 65–99)
GLUCOSE-CAPILLARY: 89 mg/dL (ref 65–99)
GLUCOSE-CAPILLARY: 71 mg/dL (ref 65–99)
GLUCOSE-CAPILLARY: 91 mg/dL (ref 65–99)
Glucose-Capillary: 80 mg/dL (ref 65–99)
Glucose-Capillary: 80 mg/dL (ref 65–99)
Glucose-Capillary: 81 mg/dL (ref 65–99)

## 2016-11-27 LAB — CBC
HCT: 35 % — ABNORMAL LOW (ref 36.0–46.0)
Hemoglobin: 11.1 g/dL — ABNORMAL LOW (ref 12.0–15.0)
MCH: 29.4 pg (ref 26.0–34.0)
MCHC: 31.7 g/dL (ref 30.0–36.0)
MCV: 92.6 fL (ref 78.0–100.0)
PLATELETS: 193 10*3/uL (ref 150–400)
RBC: 3.78 MIL/uL — AB (ref 3.87–5.11)
RDW: 14.8 % (ref 11.5–15.5)
WBC: 7.4 10*3/uL (ref 4.0–10.5)

## 2016-11-27 LAB — COMPREHENSIVE METABOLIC PANEL
ALT: 17 U/L (ref 14–54)
AST: 20 U/L (ref 15–41)
Albumin: 3.3 g/dL — ABNORMAL LOW (ref 3.5–5.0)
Alkaline Phosphatase: 150 U/L — ABNORMAL HIGH (ref 38–126)
Anion gap: 11 (ref 5–15)
BUN: 38 mg/dL — AB (ref 6–20)
CHLORIDE: 102 mmol/L (ref 101–111)
CO2: 23 mmol/L (ref 22–32)
Calcium: 9.3 mg/dL (ref 8.9–10.3)
Creatinine, Ser: 4.53 mg/dL — ABNORMAL HIGH (ref 0.44–1.00)
GFR calc Af Amer: 10 mL/min — ABNORMAL LOW (ref >60)
GFR calc non Af Amer: 9 mL/min — ABNORMAL LOW (ref >60)
Glucose, Bld: 72 mg/dL (ref 65–99)
POTASSIUM: 4.2 mmol/L (ref 3.5–5.1)
SODIUM: 136 mmol/L (ref 135–145)
Total Bilirubin: 0.6 mg/dL (ref 0.3–1.2)
Total Protein: 6.8 g/dL (ref 6.5–8.1)

## 2016-11-27 SURGERY — ABDOMINAL AORTOGRAM W/LOWER EXTREMITY
Laterality: Right

## 2016-11-27 MED ORDER — GUAIFENESIN-DM 100-10 MG/5ML PO SYRP: 15.0000 mL | ORAL_SOLUTION | ORAL | Status: DC | PRN

## 2016-11-27 MED ORDER — PHENOL 1.4 % MT LIQD: 1.0000 | OROMUCOSAL | Status: DC | PRN

## 2016-11-27 MED ORDER — ONDANSETRON HCL 4 MG/2ML IJ SOLN
4.0000 mg | Freq: Four times a day (QID) | INTRAMUSCULAR | Status: DC | PRN
  Filled 2016-11-27: qty 2

## 2016-11-27 MED ORDER — ANGIOPLASTY BOOK
Freq: Once | Status: AC
  Administered 2016-11-27: 21:00:00
  Filled 2016-11-27: qty 1

## 2016-11-27 MED ORDER — APIXABAN 5 MG PO TABS
5.0000 mg | ORAL_TABLET | Freq: Two times a day (BID) | ORAL | Status: DC
  Administered 2016-11-28: 5 mg via ORAL
  Filled 2016-11-27 (×2): qty 1

## 2016-11-27 MED ORDER — GABAPENTIN 100 MG PO CAPS
100.0000 mg | ORAL_CAPSULE | Freq: Three times a day (TID) | ORAL | Status: DC
  Administered 2016-11-27 – 2016-11-28 (×3): 100 mg via ORAL
  Filled 2016-11-27 (×3): qty 1

## 2016-11-27 MED ORDER — HEPARIN (PORCINE) IN NACL 2-0.9 UNIT/ML-% IJ SOLN
INTRAMUSCULAR | Status: DC | PRN
  Administered 2016-11-27: 1000 mL via INTRA_ARTERIAL

## 2016-11-27 MED ORDER — ONDANSETRON HCL 4 MG PO TABS: 4.0000 mg | ORAL_TABLET | Freq: Three times a day (TID) | ORAL | Status: DC | PRN

## 2016-11-27 MED ORDER — OXYCODONE HCL 5 MG PO TABS
5.0000 mg | ORAL_TABLET | ORAL | Status: DC | PRN
  Administered 2016-11-27: 10 mg via ORAL
  Filled 2016-11-27: qty 2

## 2016-11-27 MED ORDER — LABETALOL HCL 5 MG/ML IV SOLN: 10.0000 mg | INTRAVENOUS | Status: DC | PRN

## 2016-11-27 MED ORDER — IODIXANOL 320 MG/ML IV SOLN
INTRAVENOUS | Status: DC | PRN
  Administered 2016-11-27: 180 mL via INTRA_ARTERIAL

## 2016-11-27 MED ORDER — HEPARIN (PORCINE) IN NACL 2-0.9 UNIT/ML-% IJ SOLN
INTRAMUSCULAR | Status: AC
  Filled 2016-11-27: qty 1000

## 2016-11-27 MED ORDER — ALUM & MAG HYDROXIDE-SIMETH 200-200-20 MG/5ML PO SUSP: 15.0000 mL | ORAL | Status: DC | PRN

## 2016-11-27 MED ORDER — ASPIRIN EC 81 MG PO TBEC
81.0000 mg | DELAYED_RELEASE_TABLET | Freq: Every day | ORAL | Status: DC
  Administered 2016-11-28: 81 mg via ORAL
  Filled 2016-11-27: qty 1

## 2016-11-27 MED ORDER — LIDOCAINE HCL (PF) 1 % IJ SOLN
INTRAMUSCULAR | Status: AC
  Filled 2016-11-27: qty 30

## 2016-11-27 MED ORDER — LIDOCAINE HCL (PF) 1 % IJ SOLN
INTRAMUSCULAR | Status: DC | PRN
  Administered 2016-11-27: 14 mg

## 2016-11-27 MED ORDER — INSULIN ASPART 100 UNIT/ML ~~LOC~~ SOLN
2.0000 [IU] | Freq: Three times a day (TID) | SUBCUTANEOUS | Status: DC
  Administered 2016-11-28: 2 [IU] via SUBCUTANEOUS

## 2016-11-27 MED ORDER — MORPHINE SULFATE (PF) 2 MG/ML IV SOLN: 2.0000 mg | INTRAVENOUS | Status: DC | PRN

## 2016-11-27 MED ORDER — DOCUSATE SODIUM 100 MG PO CAPS
100.0000 mg | ORAL_CAPSULE | Freq: Every day | ORAL | Status: DC
  Administered 2016-11-28: 09:00:00 100 mg via ORAL
  Filled 2016-11-27: qty 1

## 2016-11-27 MED ORDER — METOPROLOL TARTRATE 5 MG/5ML IV SOLN: 2.0000 mg | INTRAVENOUS | Status: DC | PRN

## 2016-11-27 MED ORDER — MIDAZOLAM HCL 2 MG/2ML IJ SOLN
INTRAMUSCULAR | Status: DC | PRN
  Administered 2016-11-27: 1 mg via INTRAVENOUS

## 2016-11-27 MED ORDER — HYDRALAZINE HCL 20 MG/ML IJ SOLN: 5.0000 mg | INTRAMUSCULAR | Status: DC | PRN

## 2016-11-27 MED ORDER — PANTOPRAZOLE SODIUM 40 MG PO TBEC
40.0000 mg | DELAYED_RELEASE_TABLET | Freq: Every day | ORAL | Status: DC
  Administered 2016-11-28: 09:00:00 40 mg via ORAL
  Filled 2016-11-27 (×2): qty 1

## 2016-11-27 MED ORDER — HEPARIN SODIUM (PORCINE) 1000 UNIT/ML IJ SOLN
INTRAMUSCULAR | Status: DC | PRN
  Administered 2016-11-27: 7000 [IU] via INTRAVENOUS

## 2016-11-27 MED ORDER — LABETALOL HCL 5 MG/ML IV SOLN
INTRAVENOUS | Status: AC
  Filled 2016-11-27: qty 4

## 2016-11-27 MED ORDER — ATROPINE SULFATE 1 MG/10ML IJ SOSY
PREFILLED_SYRINGE | INTRAMUSCULAR | Status: AC
  Filled 2016-11-27: qty 10

## 2016-11-27 MED ORDER — POTASSIUM CHLORIDE CRYS ER 20 MEQ PO TBCR: 20.0000 meq | EXTENDED_RELEASE_TABLET | Freq: Once | ORAL | Status: DC

## 2016-11-27 MED ORDER — ACETAMINOPHEN 325 MG RE SUPP
325.0000 mg | RECTAL | Status: DC | PRN
  Filled 2016-11-27: qty 2

## 2016-11-27 MED ORDER — DOCUSATE SODIUM 100 MG PO CAPS: 100.0000 mg | ORAL_CAPSULE | Freq: Every day | ORAL | Status: DC

## 2016-11-27 MED ORDER — PHENOL 1.4 % MT LIQD
1.0000 | OROMUCOSAL | Status: DC | PRN
  Filled 2016-11-27: qty 177

## 2016-11-27 MED ORDER — CARVEDILOL 3.125 MG PO TABS
3.1250 mg | ORAL_TABLET | Freq: Two times a day (BID) | ORAL | Status: DC
  Administered 2016-11-28 (×2): 3.125 mg via ORAL
  Filled 2016-11-27 (×2): qty 1

## 2016-11-27 MED ORDER — LABETALOL HCL 5 MG/ML IV SOLN
10.0000 mg | INTRAVENOUS | Status: AC | PRN
  Administered 2016-11-27 (×4): 10 mg via INTRAVENOUS
  Filled 2016-11-27: qty 4

## 2016-11-27 MED ORDER — SODIUM CHLORIDE 0.9% FLUSH: 3.0000 mL | INTRAVENOUS | Status: DC | PRN

## 2016-11-27 MED ORDER — ACETAMINOPHEN 325 MG PO TABS: 325.0000 mg | ORAL_TABLET | ORAL | Status: DC | PRN

## 2016-11-27 MED ORDER — FENTANYL CITRATE (PF) 100 MCG/2ML IJ SOLN
INTRAMUSCULAR | Status: AC
  Filled 2016-11-27: qty 2

## 2016-11-27 MED ORDER — VERAPAMIL HCL 2.5 MG/ML IV SOLN
INTRAVENOUS | Status: AC
  Filled 2016-11-27: qty 2

## 2016-11-27 MED ORDER — NEPRO/CARBSTEADY PO LIQD
237.0000 mL | Freq: Two times a day (BID) | ORAL | Status: DC
  Administered 2016-11-28 (×2): 237 mL via ORAL
  Filled 2016-11-27 (×4): qty 237

## 2016-11-27 MED ORDER — VIPERSLIDE LUBRICANT OPTIME
TOPICAL | Status: DC | PRN
  Administered 2016-11-27: 1 mL via SURGICAL_CAVITY

## 2016-11-27 MED ORDER — CITALOPRAM HYDROBROMIDE 20 MG PO TABS
10.0000 mg | ORAL_TABLET | Freq: Every day | ORAL | Status: DC
  Administered 2016-11-28: 10 mg via ORAL
  Filled 2016-11-27: qty 1

## 2016-11-27 MED ORDER — RENA-VITE PO TABS
1.0000 | ORAL_TABLET | Freq: Every day | ORAL | Status: DC
  Administered 2016-11-27 – 2016-11-28 (×2): 1 via ORAL
  Filled 2016-11-27 (×2): qty 1

## 2016-11-27 MED ORDER — FENTANYL CITRATE (PF) 100 MCG/2ML IJ SOLN
INTRAMUSCULAR | Status: DC | PRN
  Administered 2016-11-27: 25 ug via INTRAVENOUS

## 2016-11-27 MED ORDER — CALCIUM ACETATE (PHOS BINDER) 667 MG PO CAPS
667.0000 mg | ORAL_CAPSULE | Freq: Three times a day (TID) | ORAL | Status: DC
  Administered 2016-11-28 (×2): 667 mg via ORAL
  Filled 2016-11-27 (×2): qty 1

## 2016-11-27 MED ORDER — OXYCODONE-ACETAMINOPHEN 5-325 MG PO TABS: 1.0000 | ORAL_TABLET | ORAL | Status: DC | PRN

## 2016-11-27 MED ORDER — MIDAZOLAM HCL 2 MG/2ML IJ SOLN
INTRAMUSCULAR | Status: AC
  Filled 2016-11-27: qty 2

## 2016-11-27 MED ORDER — NITROGLYCERIN 1 MG/10 ML FOR IR/CATH LAB
INTRA_ARTERIAL | Status: DC | PRN
  Administered 2016-11-27: 400 ug via INTRA_ARTERIAL

## 2016-11-27 MED ORDER — NITROGLYCERIN IN D5W 200-5 MCG/ML-% IV SOLN
INTRAVENOUS | Status: AC
  Filled 2016-11-27: qty 250

## 2016-11-27 SURGICAL SUPPLY — 28 items
BAG SNAP BAND KOVER 36X36 (MISCELLANEOUS) ×4 IMPLANT
BALLN COYOTE OTW 3X150X150 (BALLOONS) ×4
BALLN LUTONIX 4X150X130 (BALLOONS) ×4
BALLOON COYOTE OTW 3X150X150 (BALLOONS) ×2 IMPLANT
BALLOON LUTONIX 4X150X130 (BALLOONS) ×2 IMPLANT
CATH OMNI FLUSH 5F 65CM (CATHETERS) ×4 IMPLANT
CATH QUICKCROSS .018X135CM (MICROCATHETER) ×4 IMPLANT
COVER DOME SNAP 22 D (MISCELLANEOUS) ×4 IMPLANT
COVER PRB 48X5XTLSCP FOLD TPE (BAG) ×2 IMPLANT
COVER PROBE 5X48 (BAG) ×2
DIAMONDBACK CLASSIC OAS 1.5MM (CATHETERS) ×4
DRAPE ZERO GRAVITY STERILE (DRAPES) ×4 IMPLANT
GUIDEWIRE STR TIP .014X300X8 (WIRE) ×4 IMPLANT
KIT ENCORE 26 ADVANTAGE (KITS) ×4 IMPLANT
KIT MICROINTRODUCER STIFF 5F (SHEATH) ×4 IMPLANT
KIT PV (KITS) ×4 IMPLANT
LUBRICANT VIPERSLIDE CORONARY (MISCELLANEOUS) ×4 IMPLANT
SHEATH HIGHFLEX ANSEL 6FRX55 (SHEATH) ×4 IMPLANT
SHEATH PINNACLE 5F 10CM (SHEATH) ×4 IMPLANT
SHIELD RADPAD SCOOP 12X17 (MISCELLANEOUS) ×4 IMPLANT
SYR MEDRAD MARK V 150ML (SYRINGE) ×4 IMPLANT
SYSTEM DIMNDBCK CLSC OAS 1.5MM (CATHETERS) ×2 IMPLANT
TAPE RADIOPAQUE TURBO (MISCELLANEOUS) ×4 IMPLANT
TRANSDUCER W/STOPCOCK (MISCELLANEOUS) ×4 IMPLANT
TRAY PV CATH (CUSTOM PROCEDURE TRAY) ×4 IMPLANT
WIRE BENTSON .035X145CM (WIRE) ×4 IMPLANT
WIRE TORQFLEX AUST .018X40CM (WIRE) ×4 IMPLANT
WIRE VIPER ADVANCE .017X335CM (WIRE) ×4 IMPLANT

## 2016-11-27 NOTE — Telephone Encounter (Signed)
-----   Message from Mena Goes, RN sent at 11/27/2016  3:13 PM EST ----- Regarding: 4 weeks w/ LE artieral    ----- Message ----- From: Serafina Mitchell, MD Sent: 11/27/2016   3:09 PM To: Vvs Charge Pool  11/27/2016: Surgeon:  [name] Procedure Performed:  1.  Ultrasound-guided access, left femoral artery  2.  Abdominal aortogram  3.  Right lower extremity runoff  4.  Atherectomy, right superficial femoral artery/popliteal artery  5.  Drug coated balloon angioplasty, right superficial femoral/popliteal artery  6.  Balloon angioplasty, right tibioperoneal trunk  7.  Balloon angioplasty, right peroneal artery  8.  Conscious sedation (100 minutes)   Follow-up in my office in one month with a duplex and wound check with me

## 2016-11-27 NOTE — Op Note (Signed)
Patient name: Deborah Hendricks MRN: [id number] DOB: Mar 24, 1946 Sex: female  11/27/2016 Pre-operative Diagnosis: right leg ulcer Post-operative diagnosis:  Same Surgeon:  [name] Procedure Performed:  1.  Ultrasound-guided access, left femoral artery  2.  Abdominal aortogram  3.  Right lower extremity runoff  4.  Atherectomy, right superficial femoral artery/popliteal artery  5.  Drug coated balloon angioplasty, right superficial femoral/popliteal artery  6.  Balloon angioplasty, right tibioperoneal trunk  7.  Balloon angioplasty, right peroneal artery  8.  Conscious sedation (100 minutes)  9.  Right arm and central venogram   Indications:  The patient has a history of a left above-knee amputation.  She is a renal failure dialysis patient.  She presents with a right foot ulcer.  Procedure:  The patient was identified in the holding area and taken to room 8.  The patient was then placed supine on the table and prepped and draped in the usual sterile fashion.  A time out was called.  Conscious sedation was performed with the use of IV fentanyl and Versed in a continuous physician and nurse monitoring.  Heart rate, blood pressure, and oxygen saturations were continuously monitored Ultrasound was used to evaluate the left common femoral artery.  It was patent .  A digital ultrasound image was acquired.  A micropuncture needle was used to access the left common femoral artery under ultrasound guidance.  An 018 wire was advanced without resistance and a micropuncture sheath was placed.  The 018 wire was removed and a benson wire was placed.  The micropuncture sheath was exchanged for a 5 french sheath.  An omniflush catheter was advanced over the wire to the level of L-1.  An abdominal angiogram was obtained.  Next, using the omniflush catheter and a benson wire, the aortic bifurcation was crossed and the catheter was placed into theright external iliac artery and right runoff was obtained.     Findings:   Aortogram:  No significant renal artery stenosis.  The infrarenal abdominal aorta is widely patent.  Bilateral common and external iliac arteries are widely patent  Right Lower Extremity:  Right common femoral and profunda femoral artery are patent throughout the course.  There is proximally 50% stenosis at the origin of the right superficial femoral artery.  Within the superficial femoral artery at the level of the adductor canal there is calcification with narrowing of approximately 60% of the superficial femoral/popliteal artery.  Popliteal artery becomes uniformly narrowed throughout its course proximally 50-60%.  There is approximately 60% stenosis within the tortuous proximal peroneal artery which is the single vessel runoff.  Left Lower Extremity:  Not evaluated  Right arm and central venogram: Diminutive appearing veins are identified in the upper arm.  The axillary vein does appear to be of adequate caliber beginning in the axilla with no evidence of central venous stenosis  Intervention:  After the above images were acquired, the decision was made to proceed with intervention.  Over a 035 wire, a 6 French 55 cm high flex Ansel 1 sheath was advanced into the right superficial femoral artery.  The patient was fully heparinized.  After the heparin circulated using a V 14 wire and a quick cross catheter, access was gained into the peroneal artery which was confirmed with the contrast injection.  The V 14 wire was removed and a viper wire was inserted.  I then selected the CSI orbital atherectomy 1.5 classic device and performed atherectomy at low medium and high speed within the  adductor canal treating a distance of approximately 15 cm in the above-knee popliteal artery and distal superficial femoral artery.  I then perform drug coated balloon angioplasty with a 4 x 1 50 Lutonix balloon which was held at nominal pressure for 2 minutes.  Completion imaging revealed a non-flow-limiting  dissection with residual stenosis less than 15%.  I then elected to perform primary angioplasty of the tibioperoneal trunk and peroneal artery.  A 3 x 1 50 Coyote balloon was used to perform primary balloon and plasty, taking the balloon to nominal pressure for 2 minutes.  Completion imaging revealed resolution of the stenosis to less than 10%.  I then reevaluated the ostial superficial femoral artery lesion with a arteriogram and this appeared to be significantly improved likely from sheath insertion.  The sheath was withdrawn to the left external iliac artery.  Catheters and wires were removed.  The patient taken the holding area for sheath pull and sclerae profile corrects.  Impression:  #1  approximately 70% long segment narrowing of the superficial femoral and popliteal artery on the right, successfully treated with orbital atherectomy using a CSI 1.5 classic device and a 4 x 1 50 drug coated balloon with residual stenosis less than 15%  #2  approximately 60-70 percent stenosis within the tibioperoneal trunk and proximal peroneal artery successfully treated with primary balloon and plasty using a 3 mm balloon with residual stenosis less than 10%.  #3  single vessel runoff via the peroneal artery which collateralizes at the foot    V. [name], M.D. Vascular and Vein Specialists of [place] Office: [phone] Pager:  [phone]

## 2016-11-27 NOTE — H&P (View-Only) (Signed)
Vascular and Vein Specialist of [facility]  Patient name: Deborah Hendricks MRN: [id number] DOB: 1946-10-22 Sex: female  REASON FOR VISIT: right leg ulcer  HPI: Deborah Hendricks is a 71 y.o. female who returns today for evaluation of a right leg ulcer.  This has been present for many weeks.  She is status post left above-knee amputation on 12/15/2015.  She also suffers from end-stage renal disease.  She has undergone a basilic vein procedure which has not developed.  She has a catheter in her right thigh.  He has a history of stroke.  She is on anticoagulation.  She suffers from diabetes with multiple complications.  Past Medical History:  Diagnosis Date  . Anemia   . Anxiety   . CHF (congestive heart failure) ([place])   . Constipation   . Critical lower limb ischemia   . Depression   . Diabetes mellitus without complication ([place])   . Hypertension   . Kidney disease    ESRD  . Memory loss    since stroke 04/2015  . Peripheral vascular disease ([place])   . Seizures ([place]) 04/2015   since   . Stroke ([place]) 04/2015   unable to stand .  can have diffuuclty with speech at times    Family History  Problem Relation Age of Onset  . Asthma Mother   . Cancer Sister     SOCIAL HISTORY: Social History  Substance Use Topics  . Smoking status: Former Smoker    Quit date: 11/17/2013  . Smokeless tobacco: Never Used  . Alcohol use No    Allergies  Allergen Reactions  . Penicillins Hives, Itching and Rash    Has patient had a PCN reaction causing immediate rash, facial/tongue/throat swelling, SOB or lightheadedness with hypotension: Yes Has patient had a PCN reaction causing severe rash involving mucus membranes or skin necrosis:  Has patient had a PCN reaction that required hospitalization  Has patient had a PCN reaction occurring within the last 10 years:  If all of the above answers are "NO", then may proceed with Cephalosporin use.  [name] Amoxicillin Other (See  Comments)    Unspecified Per MAR  . Latex Other (See Comments)    Unspecified Per MAR  . Sulfa Antibiotics Other (See Comments)    Unspecified Per [facility]    Current Outpatient Prescriptions  Medication Sig Dispense Refill  . apixaban (ELIQUIS) 5 MG TABS tablet Take 5 mg by mouth daily.    [name] aspirin EC 81 MG tablet Take 1 tablet (81 mg total) by mouth daily. 60 tablet 1  . calcium acetate (PHOSLO) 667 MG capsule Take 667 mg by mouth 3 (three) times daily with meals.    . carvedilol (COREG) 3.125 MG tablet Take 1 tablet (3.125 mg total) by mouth 2 (two) times daily with a meal. 60 tablet 1  . doxycycline (VIBRA-TABS) 100 MG tablet Take 1 tablet (100 mg total) by mouth 2 (two) times daily. 20 tablet 0  . gabapentin (NEURONTIN) 100 MG capsule Take 100 mg by mouth 3 (three) times daily.    . insulin aspart (NOVOLOG) 100 UNIT/ML injection Inject 2-10 Units into the skin 3 (three) times daily before meals. 151-200=2 units, 201-250=4 units, 251-300=6 units, 301-350=8 units, 351-400=10 units, >400=10 units and Call MD    . multivitamin (RENA-VIT) TABS tablet Take 1 tablet by mouth daily.    . Nutritional Supplements (FEEDING SUPPLEMENT, NEPRO CARB STEADY,) LIQD Take 237 mLs by mouth 2 (two) times daily between meals.    [name]  ondansetron (ZOFRAN) 4 MG tablet Take 4 mg by mouth every 8 (eight) hours as needed for nausea or vomiting.    . pantoprazole (PROTONIX) 40 MG tablet Take 1 tablet (40 mg total) by mouth daily. 30 tablet 1   No current facility-administered medications for this visit.     REVIEW OF SYSTEMS:  [X]  denotes positive finding, [ ]  denotes negative finding Cardiac  Comments:  Chest pain or chest pressure:    Shortness of breath upon exertion:    Short of breath when lying flat:    Irregular heart rhythm:        Vascular    Pain in calf, thigh, or hip brought on by ambulation:    Pain in feet at night that wakes you up from your sleep:     Blood clot in your veins:    Leg swelling:          Pulmonary    Oxygen at home:    Productive cough:     Wheezing:         Neurologic    Sudden weakness in arms or legs:     Sudden numbness in arms or legs:     Sudden onset of difficulty speaking or slurred speech:    Temporary loss of vision in one eye:     Problems with dizziness:         Gastrointestinal    Blood in stool:     Vomited blood:         Genitourinary    Burning when urinating:     Blood in urine:        Psychiatric    Major depression:         Hematologic    Bleeding problems:    Problems with blood clotting too easily:        Skin    Rashes or ulcers: x       Constitutional    Fever or chills:      PHYSICAL EXAM: Vitals:   11/23/16 1154  BP: (!) 143/77  Pulse: 67  Resp: 18  Temp: 97.7 F (36.5 C)  TempSrc: Oral  Weight: 150 lb (68 kg)  Height: 5\' 4"  (1.626 m)    GENERAL: The patient is a well-nourished female, in no acute distress. The vital signs are documented above. CARDIAC: There is a regular rate and rhythm.  VASCULAR: No palpable pedal pulses on the right PULMONARY: Non-labored respirations MUSCULOSKELETAL: Left above-knee amputation.  Amputation of right toes 2 through 5. NEUROLOGIC: No focal weakness or paresthesias are detected. SKIN: There is an ulcer at the tip of the right great toe as well as at the metatarsal head and on her knee at the level of the patella. PSYCHIATRIC: The patient has a normal affect.  DATA:  Doppler studies show: 50-74% focal stenosis in the right proximal SFA; heavy calcification may be obscuring stenosis. 30-49% stenosis in the right popliteal artery. One vessel run-off via right peroneal artery.  Occluded right anterior and posterior tibial,  MEDICAL ISSUES: #1: Right foot ulcer: Patient will be scheduled for angiography this Tuesday, January 16.  She potentially would be a candidate for SFA/popliteal/peroneal intervention.  She will need to be off of her Eliquis.    #2: End-stage renal  disease: She currently dialyzes through a right thigh catheter.  I had previously done a right basilic vein procedure.  I do not feel a thrill within her fistula.  She has an old left upper  arm graft which is occluded.  I am going to perform a central venogram on the right for access planning.      [name], MD Vascular and Vein Specialists of [facility] [phone] Pager [phone]

## 2016-11-27 NOTE — Interval H&P Note (Signed)
History and Physical Interval Note:  11/27/2016 1:20 PM  Deborah Hendricks  has presented today for surgery, with the diagnosis of right foot ulcer - instage renal  The various methods of treatment have been discussed with the patient and family. After consideration of risks, benefits and other options for treatment, the patient has consented to  Procedure(s): Abdominal Aortogram w/Lower Extremity (N/A) Right arm and central venogram (N/A) as a surgical intervention .  The patient's history has been reviewed, patient examined, no change in status, stable for surgery.  I have reviewed the patient's chart and labs.  Questions were answered to the patient's satisfaction.     [name]

## 2016-11-27 NOTE — Telephone Encounter (Signed)
Sched lab 01/02/17 at 4:00 and MD 01/07/17 at 3:00. Spoke to pt's daughter to inform them of appt.

## 2016-11-28 ENCOUNTER — Encounter (HOSPITAL_COMMUNITY): Payer: Self-pay | Admitting: Surgery

## 2016-11-28 ENCOUNTER — Encounter: Payer: Medicare Other | Admitting: Physical Therapy

## 2016-11-28 DIAGNOSIS — I132 Hypertensive heart and chronic kidney disease with heart failure and with stage 5 chronic kidney disease, or end stage renal disease: Secondary | ICD-10-CM | POA: Diagnosis not present

## 2016-11-28 DIAGNOSIS — Z89612 Acquired absence of left leg above knee: Secondary | ICD-10-CM | POA: Diagnosis not present

## 2016-11-28 DIAGNOSIS — N186 End stage renal disease: Secondary | ICD-10-CM | POA: Diagnosis not present

## 2016-11-28 DIAGNOSIS — E11621 Type 2 diabetes mellitus with foot ulcer: Secondary | ICD-10-CM | POA: Diagnosis not present

## 2016-11-28 DIAGNOSIS — E1122 Type 2 diabetes mellitus with diabetic chronic kidney disease: Secondary | ICD-10-CM | POA: Diagnosis not present

## 2016-11-28 DIAGNOSIS — L97519 Non-pressure chronic ulcer of other part of right foot with unspecified severity: Secondary | ICD-10-CM | POA: Diagnosis not present

## 2016-11-28 LAB — GLUCOSE, CAPILLARY
GLUCOSE-CAPILLARY: 81 mg/dL (ref 65–99)
GLUCOSE-CAPILLARY: 170 mg/dL — AB (ref 65–99)
Glucose-Capillary: 70 mg/dL (ref 65–99)

## 2016-11-28 LAB — POCT ACTIVATED CLOTTING TIME: Activated Clotting Time: 362 seconds

## 2016-11-28 NOTE — Care Management Note (Signed)
Case Management Note  Patient Details  Name: Deborah Hendricks MRN: [id number] Date of Birth: Mar 14, 1946  Subjective/Objective:   S/p pv intervention, already on home eliquis, NCM will cont to follow for dc needs.                 Action/Plan:   Expected Discharge Date:  11/28/16               Expected Discharge Plan:  Home/Self Care  In-House Referral:     Discharge planning Services  CM Consult  Post Acute Care Choice:    Choice offered to:     DME Arranged:    DME Agency:     HH Arranged:    HH Agency:     Status of Service:  Completed, signed off  If discussed at [employer] of Stay Meetings, dates discussed:    Additional Comments:  Zenon Mayo, RN 11/28/2016, 1:19 PM

## 2016-11-28 NOTE — Progress Notes (Signed)
Vascular and Vein Specialists of [place]  Subjective  - No complaints.  Eating breakfast fine.   Objective (!) 134/55 70 98.3 F (36.8 C) (Oral) 15 98% No intake or output data in the 24 hours ending 11/28/16 0750  Doppler DP/PT signal Left groin soft Heart RRR Lungs non labored breathing  Assessment/Planning: POD # 1 Surgeon:  [name] Procedure Performed:             1.  Ultrasound-guided access, left femoral artery             2.  Abdominal aortogram             3.  Right lower extremity runoff             4.  Atherectomy, right superficial femoral artery/popliteal artery             5.  Drug coated balloon angioplasty, right superficial femoral/popliteal artery             6.  Balloon angioplasty, right tibioperoneal trunk             7.  Balloon angioplasty, right peroneal artery             8.  Conscious sedation (100 minutes)             9.  Right arm and central venogram Findings:              Aortogram:  No significant renal artery stenosis.  The infrarenal abdominal aorta is widely patent.  Bilateral common and external iliac arteries are widely patent             Right Lower Extremity:  Right common femoral and profunda femoral artery are patent throughout the course.  There is proximally 50% stenosis at the origin of the right superficial femoral artery.  Within the superficial femoral artery at the level of the adductor canal there is calcification with narrowing of approximately 60% of the superficial femoral/popliteal artery.  Popliteal artery becomes uniformly narrowed throughout its course proximally 50-60%.  There is approximately 60% stenosis within the tortuous proximal peroneal artery which is the single vessel runoff.             Left Lower Extremity:  Not evaluated             Right arm and central venogram: Diminutive appearing veins are identified in the upper arm.  The axillary vein does appear to be of adequate caliber beginning in the axilla  with no evidence of central venous stenosis  Impression:             #1  approximately 70% long segment narrowing of the superficial femoral and popliteal artery on the right, successfully treated with orbital atherectomy using a CSI 1.5 classic device and a 4 x 1 50 drug coated balloon with residual stenosis less than 15%             #2  approximately 60-70 percent stenosis within the tibioperoneal trunk and proximal peroneal artery successfully treated with primary balloon and plasty using a 3 mm balloon with residual stenosis less than 10%.             #3  single vessel runoff via the peroneal artery which collateralizes at the foot  Stable disposition for discharge home pending weather and her daughters ability to drive her home.  Laurence Slate [facility] 11/28/2016 7:50 AM --  Laboratory Lab Results:  Recent Labs  11/27/16 0915  11/27/16 1939  WBC  --  7.4  HGB 12.2 11.1*  HCT 36.0 35.0*  PLT  --  193   BMET  Recent Labs  11/27/16 0915 11/27/16 1939  NA 137 136  K 4.2 4.2  CL 103 102  CO2  --  23  GLUCOSE 87 72  BUN 42* 38*  CREATININE 4.00* 4.53*  CALCIUM  --  9.3    COAG Lab Results  Component Value Date   INR 1.11 11/27/2016   INR 1.22 07/26/2016   INR 1.23 01/19/2016   No results found for: PTT

## 2016-11-28 NOTE — Progress Notes (Signed)
Site area: left groin  Site Prior to Removal:  Level 0  Pressure Applied For 20 MINUTES    Minutes Beginning at 21:25 (11/28/15)  Manual:   Yes.    Patient Status During Pull:  WNL  Post Pull Groin Site:  Level 0  Post Pull Instructions Given:  Yes.    Post Pull Pulses Present:  Yes.    Dressing Applied:  Yes.    Comments:  Pt. tolerated well.

## 2016-11-29 ENCOUNTER — Telehealth: Payer: Self-pay | Admitting: Surgery

## 2016-11-29 DIAGNOSIS — N2581 Secondary hyperparathyroidism of renal origin: Secondary | ICD-10-CM | POA: Diagnosis not present

## 2016-11-29 DIAGNOSIS — N186 End stage renal disease: Secondary | ICD-10-CM | POA: Diagnosis not present

## 2016-11-29 DIAGNOSIS — D631 Anemia in chronic kidney disease: Secondary | ICD-10-CM | POA: Diagnosis not present

## 2016-11-29 DIAGNOSIS — E119 Type 2 diabetes mellitus without complications: Secondary | ICD-10-CM | POA: Diagnosis not present

## 2016-11-29 NOTE — Telephone Encounter (Signed)
Error

## 2016-11-29 NOTE — Telephone Encounter (Signed)
-----   Message from Mena Goes, RN sent at 11/28/2016  1:40 PM EST ----- Regarding: 4 weeks may be duplicate   ----- Message ----- From: Ulyses Amor, PA-C Sent: 11/28/2016   7:55 AM To: [facility] Charge Pool  S/p angiogram f/u with [facility] in 4 weeks with abi's

## 2016-11-30 ENCOUNTER — Ambulatory Visit: Payer: Medicare Other | Admitting: Podiatry

## 2016-12-01 DIAGNOSIS — N2581 Secondary hyperparathyroidism of renal origin: Secondary | ICD-10-CM | POA: Diagnosis not present

## 2016-12-01 DIAGNOSIS — E119 Type 2 diabetes mellitus without complications: Secondary | ICD-10-CM | POA: Diagnosis not present

## 2016-12-01 DIAGNOSIS — N186 End stage renal disease: Secondary | ICD-10-CM | POA: Diagnosis not present

## 2016-12-01 DIAGNOSIS — D631 Anemia in chronic kidney disease: Secondary | ICD-10-CM | POA: Diagnosis not present

## 2016-12-03 ENCOUNTER — Encounter: Payer: Medicare Other | Admitting: Physical Therapy

## 2016-12-04 DIAGNOSIS — N186 End stage renal disease: Secondary | ICD-10-CM | POA: Diagnosis not present

## 2016-12-04 DIAGNOSIS — N2581 Secondary hyperparathyroidism of renal origin: Secondary | ICD-10-CM | POA: Diagnosis not present

## 2016-12-04 DIAGNOSIS — E119 Type 2 diabetes mellitus without complications: Secondary | ICD-10-CM | POA: Diagnosis not present

## 2016-12-04 DIAGNOSIS — D631 Anemia in chronic kidney disease: Secondary | ICD-10-CM | POA: Diagnosis not present

## 2016-12-05 ENCOUNTER — Encounter: Payer: Medicare Other | Attending: Internal Medicine | Admitting: Internal Medicine

## 2016-12-05 ENCOUNTER — Encounter: Payer: Self-pay | Admitting: Internal Medicine

## 2016-12-05 DIAGNOSIS — G40909 Epilepsy, unspecified, not intractable, without status epilepticus: Secondary | ICD-10-CM | POA: Diagnosis not present

## 2016-12-05 DIAGNOSIS — I4891 Unspecified atrial fibrillation: Secondary | ICD-10-CM | POA: Insufficient documentation

## 2016-12-05 DIAGNOSIS — Z880 Allergy status to penicillin: Secondary | ICD-10-CM | POA: Diagnosis not present

## 2016-12-05 DIAGNOSIS — D649 Anemia, unspecified: Secondary | ICD-10-CM | POA: Insufficient documentation

## 2016-12-05 DIAGNOSIS — E1152 Type 2 diabetes mellitus with diabetic peripheral angiopathy with gangrene: Secondary | ICD-10-CM | POA: Insufficient documentation

## 2016-12-05 DIAGNOSIS — Z992 Dependence on renal dialysis: Secondary | ICD-10-CM | POA: Diagnosis not present

## 2016-12-05 DIAGNOSIS — I132 Hypertensive heart and chronic kidney disease with heart failure and with stage 5 chronic kidney disease, or end stage renal disease: Secondary | ICD-10-CM | POA: Diagnosis not present

## 2016-12-05 DIAGNOSIS — I509 Heart failure, unspecified: Secondary | ICD-10-CM | POA: Insufficient documentation

## 2016-12-05 DIAGNOSIS — M1990 Unspecified osteoarthritis, unspecified site: Secondary | ICD-10-CM | POA: Diagnosis not present

## 2016-12-05 DIAGNOSIS — E1122 Type 2 diabetes mellitus with diabetic chronic kidney disease: Secondary | ICD-10-CM | POA: Insufficient documentation

## 2016-12-05 DIAGNOSIS — L97511 Non-pressure chronic ulcer of other part of right foot limited to breakdown of skin: Secondary | ICD-10-CM | POA: Insufficient documentation

## 2016-12-05 DIAGNOSIS — E1142 Type 2 diabetes mellitus with diabetic polyneuropathy: Secondary | ICD-10-CM | POA: Insufficient documentation

## 2016-12-05 DIAGNOSIS — Z8673 Personal history of transient ischemic attack (TIA), and cerebral infarction without residual deficits: Secondary | ICD-10-CM | POA: Insufficient documentation

## 2016-12-05 DIAGNOSIS — S91301A Unspecified open wound, right foot, initial encounter: Secondary | ICD-10-CM | POA: Diagnosis not present

## 2016-12-05 DIAGNOSIS — N186 End stage renal disease: Secondary | ICD-10-CM | POA: Diagnosis not present

## 2016-12-05 DIAGNOSIS — L97818 Non-pressure chronic ulcer of other part of right lower leg with other specified severity: Secondary | ICD-10-CM | POA: Diagnosis not present

## 2016-12-05 DIAGNOSIS — Z794 Long term (current) use of insulin: Secondary | ICD-10-CM | POA: Diagnosis not present

## 2016-12-05 DIAGNOSIS — E11622 Type 2 diabetes mellitus with other skin ulcer: Secondary | ICD-10-CM | POA: Diagnosis not present

## 2016-12-05 DIAGNOSIS — Z87891 Personal history of nicotine dependence: Secondary | ICD-10-CM | POA: Insufficient documentation

## 2016-12-05 DIAGNOSIS — L97812 Non-pressure chronic ulcer of other part of right lower leg with fat layer exposed: Secondary | ICD-10-CM | POA: Diagnosis not present

## 2016-12-05 DIAGNOSIS — Z89612 Acquired absence of left leg above knee: Secondary | ICD-10-CM | POA: Diagnosis not present

## 2016-12-05 DIAGNOSIS — S90421A Blister (nonthermal), right great toe, initial encounter: Secondary | ICD-10-CM | POA: Diagnosis not present

## 2016-12-06 DIAGNOSIS — N186 End stage renal disease: Secondary | ICD-10-CM | POA: Diagnosis not present

## 2016-12-06 DIAGNOSIS — E119 Type 2 diabetes mellitus without complications: Secondary | ICD-10-CM | POA: Diagnosis not present

## 2016-12-06 DIAGNOSIS — N2581 Secondary hyperparathyroidism of renal origin: Secondary | ICD-10-CM | POA: Diagnosis not present

## 2016-12-06 DIAGNOSIS — D631 Anemia in chronic kidney disease: Secondary | ICD-10-CM | POA: Diagnosis not present

## 2016-12-07 NOTE — Progress Notes (Addendum)
Deborah Hendricks, Deborah Hendricks ([id number]) Visit Report for 12/05/2016 Chief Complaint Document Details Patient Name: Deborah Hendricks, Deborah Hendricks Date Deborah Service: 12/05/2016 9:30 AM Medical Record Patient Account Number: [id number] Number: Treating RN: [birth year] (71 y.o. Other Clinician: Date Deborah Birth/Sex: Female) Treating Lovette Merta Primary Care Provider: Wendy Poet Provider/Extender: G Referring Provider: Daphene Jaeger in Treatment: 0 Information Obtained from: Patient Chief Complaint 12/05/16; patient is here for review Deborah 3 wounds on the right lower extremity. Electronic Signature(s) Signed: 12/05/2016 6:11:14 PM By: Linton Ham MD Entered By: Linton Ham on 12/05/2016 12:48:44 Deborah Hendricks ([id number]) -------------------------------------------------------------------------------- Debridement Details Patient Name: Deborah Hendricks Date Deborah Service: 12/05/2016 9:30 AM Medical Record Patient Account Number: [id number] Number: Treating RN: 07-Nov-1946 (71 y.o. Other Clinician: Date Deborah Birth/Sex: Female) Treating Jaydan Chretien Primary Care Provider: Wendy Poet Provider/Extender: G Referring Provider: Daphene Jaeger in Treatment: 0 Debridement Performed for Wound #3 Right,Anterior Knee Assessment: Performed By: Physician Ricard Dillon, MD Debridement: Debridement Pre-procedure Yes - 11:20 Verification/Time Out Taken: Start Time: 11:21 Pain Control: Lidocaine 4% Topical Solution Level: Skin/Subcutaneous Tissue Total Area Debrided (L x 2 (cm) x 2 (cm) = 4 (cm) W): Tissue and other Viable, Non-Viable, Exudate, Fibrin/Slough, Subcutaneous material debrided: Instrument: Curette Bleeding: Minimum Hemostasis Achieved: Pressure End Time: 11:22 Procedural Pain: 0 Post Procedural Pain: 0 Response to Treatment: Procedure was tolerated well Post Debridement Measurements Deborah Total Wound Length: (cm) 2 Width: (cm) 2 Depth: (cm) 0.1 Volume: (cm)  0.314 Character Deborah Wound/Ulcer Post Requires Further Debridement Debridement: Severity Deborah Tissue Post Debridement: Fat layer exposed Post Procedure Diagnosis Same as Pre-procedure Electronic Signature(s) Signed: 12/05/2016 6:11:14 PM By: Linton Ham MD Entered By: Linton Ham on 12/05/2016 12:46:00 Deborah Hendricks ([id number]) Deborah Hendricks ([id number]) -------------------------------------------------------------------------------- HPI Details Patient Name: Deborah Hendricks Date Deborah Service: 12/05/2016 9:30 AM Medical Record Patient Account Number: [id number] Number: Treating RN: [birth year] (71 y.o. 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 She also has chronic renal failure on dialysis, diabetic peripheral neuropathy. She recently was revascularized by Dr. Trula Slade vascular surgery. This includes extensive procedures including an atherectomy Deborah the right superficial femoral and popliteal artery, drug-coated balloon angioplasty Deborah the right superficial femoral and popliteal artery, balloon angioplasty Deborah the right tibioperoneal trunk and balloon angioplasty Deborah the right peroneal artery. This was done for 2 areas in her right foot. She also has an open area on the right patella that is been there for several weeks. The patient thinks she may have hit this she isn't sure. She is really totally insensate she does not complain Deborah any pain. She has a  dialysis catheter in the right femoral artery. Apparently the veins in her arm are too small to perform a shunt Electronic Signature(s) Signed: 12/05/2016 6:11:14 PM By: Linton Ham MD Entered By: Linton Ham on 12/05/2016 12:51:57 Deborah Hendricks ([id number]) -------------------------------------------------------------------------------- Physical Exam Details Patient Name: Deborah Hendricks Date Deborah Service: 12/05/2016 9:30 AM Medical Record Patient Account Number: [id number] Number: Treating RN: 04-20-1946 (70 y.o. Other Clinician: Date Deborah Birth/Sex: Female) Treating Redford Behrle Primary Care Provider: Wendy Poet Provider/Extender: G Referring Provider:  Harold Barban Weeks in Treatment: 0 Constitutional Sitting or standing Blood Pressure is within target range for patient.. Pulse regular and within target range for patient.[name] Respirations regular, non-labored and within target range.. Temperature is normal and within the target range for the patient.. Patient's appearance is neat and clean. Appears in no acute distress. Well nourished and well developed.[name] Respiratory Respiratory effort is easy and symmetric bilaterally. Rate is normal at rest and on room air.. Bilateral breath sounds are clear and equal in all lobes with no wheezes, rales or rhonchi.. Cardiovascular Heart rhythm and rate regular, without murmur or gallop.. Faintly palpable. Right dorsalis pedis and posterior tibial pulses are absent. Lymphatic None palpable in the popliteal or inguinal area.. Integumentary (Hair, Skin) Multiple non-specific areas on the right leg even without open wounds. Some Deborah these look like scratch marks [from her cat]. Perhaps necrobiosis on the right anterior leg. Neurological Absent vibration sense. Psychiatric No evidence Deborah depression, anxiety, or agitation. Calm, cooperative, and communicative. Appropriate interactions and affect.. Notes Wound exam; on the  remaining right first toe plantar aspect there is an open area here with nonviable tissue around it. I did not remove this today giving the recent revascularization a chance to see what effect this will have. There is also what appears to be gangrenous change over the medial aspect Deborah the right first metatarsal head. I did not remove this either preferring to see what will happen after a period Deborah revascularization. She has an open area on the right anterior patella this was covered in necrotic surface tissue which was removed with pickups and a scalpel. She tolerated this well. The base tissue underneath this actually looks quite healthy Electronic Signature(s) Signed: 12/05/2016 6:11:14 PM By: Linton Ham MD Entered By: Linton Ham on 12/05/2016 12:55:17 Deborah Hendricks ([id number]) -------------------------------------------------------------------------------- Physician Orders Details Patient Name: Deborah Hendricks Date Deborah Service: 12/05/2016 9:30 AM Medical Record Patient Account Number: [id number] Number: Treating RN: Ahmed Prima 16-Apr-1946 (70 y.o. Other Clinician: Date Deborah Birth/Sex: Female) Treating Anaja Monts Primary Care Provider: Wendy Poet Provider/Extender: G Referring Provider: Daphene Jaeger in Treatment: 0 Verbal / Phone Orders: Yes ClinicianCarolyne Fiscal, Debi Read Back and Verified: Yes Diagnosis Coding Wound Cleansing Wound #1 Right,Plantar Toe Great o Clean wound with Normal Saline. o Cleanse wound with mild soap and water Wound #2 Right,Lateral Metatarsal head first o Clean wound with Normal Saline. o Cleanse wound with mild soap and water Wound #3 Right,Anterior Knee o Clean wound with Normal Saline. o Cleanse wound with mild soap and water Anesthetic Wound #1 Right,Plantar Toe Great o Topical Lidocaine 4% cream applied to wound bed prior to debridement - for clinic use Wound #2 Right,Lateral Metatarsal head  first o Topical Lidocaine 4% cream applied to wound bed prior to debridement - for clinic use Wound #3 Right,Anterior Knee o Topical Lidocaine 4% cream applied to wound bed prior to debridement - for clinic use Skin Barriers/Peri-Wound Care Wound #1 Right,Plantar Toe Great o Skin Prep Wound #2 Right,Lateral Metatarsal head first o Skin Prep Wound #3 Right,Anterior Knee o Skin Prep Primary Wound Dressing Wound #1 Right,Plantar Toe Aulander, Shawntay ([id number]) o Santyl Ointment Wound #2 Right,Lateral Metatarsal head first o Santyl Ointment Wound #3 Right,Anterior Knee o Promogran Secondary Dressing Wound #1 Right,Plantar Toe Great o Dry Gauze o Gauze and Kerlix/Conform o Foam o Other - stretch netting #5 Wound #2 Right,Lateral Metatarsal head first o Dry Gauze o Gauze and Kerlix/Conform o Foam Wound #3 Right,Anterior Knee o  Dry Gauze o Non-adherent pad - [place] Dressing Change Frequency Wound #1 Right,Plantar Toe Great o Change dressing every day. Wound #2 Right,Lateral Metatarsal head first o Change dressing every day. Wound #3 Right,Anterior Knee o Change dressing every other day. Follow-up Appointments Wound #1 Right,Plantar Toe Great o Return Appointment in 1 week. Wound #2 Right,Lateral Metatarsal head first o Return Appointment in 1 week. Wound #3 Right,Anterior Knee o Return Appointment in 1 week. Edema Control Wound #1 Right,Plantar Toe Great o Elevate legs to the level Deborah the heart and pump ankles as often as possible Deborah Hendricks, Deborah Hendricks ([id number]) Wound #2 Right,Lateral Metatarsal head first o Elevate legs to the level Deborah the heart and pump ankles as often as possible Wound #3 Right,Anterior Knee o Elevate legs to the level Deborah the heart and pump ankles as often as possible Off-Loading Wound #1 Right,Plantar Toe Great o Other: - float and elevate heel when lying in the bed Wound #2  Right,Lateral Metatarsal head first o Other: - float and elevate heel when lying in the bed Wound #3 Right,Anterior Knee o Other: - float and elevate heel when lying in the bed Home Health Wound #1 [place] for [place] Nurse may visit PRN to address patientos wound care needs. o FACE TO FACE ENCOUNTER: MEDICARE and MEDICAID PATIENTS: I certify that this patient is under my care and that I had a face-to-face encounter that meets the physician face-to-face encounter requirements with this patient on this date. The encounter with the patient was in whole or in part for the following MEDICAL CONDITION: (primary reason for [place]) MEDICAL NECESSITY: I certify, that based on my findings, NURSING services are a medically necessary home health service. HOME BOUND STATUS: I certify that my clinical findings support that this patient is homebound (i.e., Due to illness or injury, pt requires aid Deborah supportive devices such as crutches, cane, wheelchairs, walkers, the use Deborah special transportation or the assistance Deborah another person to leave their place Deborah residence. There is a normal inability to leave the home and doing so requires considerable and taxing effort. Other absences are for medical reasons / religious services and are infrequent or Deborah short duration when for other reasons). o If current dressing causes regression in wound condition, may D/C ordered dressing product/s and apply Normal Saline Moist Dressing daily until next [place] / Other MD appointment. [place] Deborah regression in wound condition at [phone]. o Please direct any NON-WOUND related issues/requests for orders to patient's Primary Care Physician Wound #2 Right,Lateral Metatarsal head first o [place] for [place] Nurse may visit PRN to address patientos  wound care needs. o FACE TO FACE ENCOUNTER: MEDICARE and MEDICAID PATIENTS: I certify that this patient is under my care and that I had a face-to-face encounter that meets the physician face-to-face encounter requirements with this patient on this date. The encounter with the patient was in whole or in part for the following MEDICAL CONDITION: (primary reason for [place]) MEDICAL NECESSITY: I certify, that based on my findings, NURSING services are a medically necessary home health service. HOME BOUND STATUS: I certify that my clinical findings support that this patient is homebound (i.e., Due to illness or injury, pt requires aid Deborah Deborah Hendricks, Deborah Hendricks ([id number]) supportive devices such as crutches, cane, wheelchairs, walkers, the use Deborah special transportation or the assistance Deborah another person to  leave their place Deborah residence. There is a normal inability to leave the home and doing so requires considerable and taxing effort. Other absences are for medical reasons / religious services and are infrequent or Deborah short duration when for other reasons). o If current dressing causes regression in wound condition, may D/C ordered dressing product/s and apply Normal Saline Moist Dressing daily until next [place] / Other MD appointment. [place] Deborah regression in wound condition at [phone]. o Please direct any NON-WOUND related issues/requests for orders to patient's Primary Care Physician Wound #3 [place] for [place] Nurse may visit PRN to address patientos wound care needs. o FACE TO FACE ENCOUNTER: MEDICARE and MEDICAID PATIENTS: I certify that this patient is under my care and that I had a face-to-face encounter that meets the physician face-to-face encounter requirements with this patient on this date. The encounter with the patient was in whole or in part for the following  MEDICAL CONDITION: (primary reason for [place]) MEDICAL NECESSITY: I certify, that based on my findings, NURSING services are a medically necessary home health service. HOME BOUND STATUS: I certify that my clinical findings support that this patient is homebound (i.e., Due to illness or injury, pt requires aid Deborah supportive devices such as crutches, cane, wheelchairs, walkers, the use Deborah special transportation or the assistance Deborah another person to leave their place Deborah residence. There is a normal inability to leave the home and doing so requires considerable and taxing effort. Other absences are for medical reasons / religious services and are infrequent or Deborah short duration when for other reasons). o If current dressing causes regression in wound condition, may D/C ordered dressing product/s and apply Normal Saline Moist Dressing daily until next [place] / Other MD appointment. [place] Deborah regression in wound condition at [phone]. o Please direct any NON-WOUND related issues/requests for orders to patient's Primary Care Physician Electronic Signature(s) Signed: 12/10/2016 4:23:36 PM By: Alric Quan Signed: 12/11/2016 5:54:02 PM By: Linton Ham MD Previous Signature: 12/05/2016 6:11:14 PM Version By: Linton Ham MD Previous Signature: 12/06/2016 3:37:28 PM Version By: Alric Quan Entered By: Alric Quan on 12/10/2016 12:14:26 Deborah Hendricks ([id number]) -------------------------------------------------------------------------------- Problem List Details Patient Name: Deborah Hendricks Date Deborah Service: 12/05/2016 9:30 AM Medical Record Patient Account Number: [id number] Number: Treating RN: 20-Jul-1946 (71 y.o. Other Clinician: Date Deborah Birth/Sex: Female) Treating Gwendalyn Mcgonagle Primary Care Provider: Wendy Poet Provider/Extender: G Referring Provider: Daphene Jaeger in Treatment: 0 Active  Problems ICD-10 Encounter Code Description Active Date Diagnosis E11.52 Type 2 diabetes mellitus with diabetic peripheral 12/05/2016 Yes angiopathy with gangrene E11.42 Type 2 diabetes mellitus with diabetic polyneuropathy 12/05/2016 Yes L97.511 Non-pressure chronic ulcer Deborah other part Deborah right foot 12/05/2016 Yes limited to breakdown Deborah skin L97.818 Non-pressure chronic ulcer Deborah other part Deborah right lower leg 12/05/2016 Yes with other specified severity Inactive Problems Resolved Problems Electronic Signature(s) Signed: 12/05/2016 6:11:14 PM By: Linton Ham MD Signed: 12/06/2016 3:37:28 PM By: Alric Quan Entered By: Alric Quan on 12/05/2016 13:21:02 Deborah Hendricks ([id number]) -------------------------------------------------------------------------------- Progress Note Details Patient Name: Deborah Hendricks Date Deborah Service: 12/05/2016 9:30 AM Medical Record Patient Account Number: [id number] Number: Treating RN: 04/22/1946 (71 y.o. Other Clinician: Date Deborah Birth/Sex: Female) Treating Italo Banton Primary Care Provider: Wendy Poet Provider/Extender: G Referring Provider: Daphene Jaeger in Treatment: 0 Subjective Chief Complaint Information obtained from Patient 12/05/16; patient is here  for review Deborah 3 wounds on the right lower extremity. 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 She also has chronic renal failure on dialysis, diabetic peripheral neuropathy. She recently was revascularized by Dr. Trula Slade vascular surgery. This includes extensive procedures including an atherectomy Deborah the right superficial femoral and  popliteal artery, drug-coated balloon angioplasty Deborah the right superficial femoral and popliteal artery, balloon angioplasty Deborah the right tibioperoneal trunk and balloon angioplasty Deborah the right peroneal artery. This was done for 2 areas in her right foot. She also has an open area on the right patella that is been there for several weeks. The patient thinks she may have hit this she isn't sure. She is really totally insensate she does not complain Deborah any pain. She has a dialysis catheter in the right femoral artery. Apparently the veins in her arm are too small to perform a shunt Wound History Patient presents with 2 open wounds that have been present for approximately 2 months. Patient has been treating wounds in the following manner: nothing. Laboratory tests have not been performed in the last month. Patient reportedly has not tested positive for an antibiotic resistant organism. Patient reportedly has not tested positive for osteomyelitis. Patient reportedly has not had testing performed to evaluate circulation in the legs. Patient experiences the following problems associated with their wounds: swelling. Patient History Information obtained from Patient. Allergies sulfur, penicillin Family History Cancer - Mother, Siblings, Diabetes - Siblings, Hypertension - Siblings, Kidney Disease - Siblings, No family history Deborah Heart Disease, Hereditary Spherocytosis, Lung Disease, Seizures, Stroke, Thyroid Problems, Tuberculosis. Deborah Hendricks, Deborah Hendricks ([id number]) Social History Former smoker - quit 3-4 years ago, Marital Status - Divorced, Alcohol Use - Never, Drug Use - No History, Caffeine Use - Daily. Medical History Hematologic/Lymphatic Patient has history Deborah Anemia Cardiovascular Patient has history Deborah Arrhythmia - a-fib, Congestive Heart Failure, Hypertension, Peripheral Venous Disease Endocrine Patient has history Deborah Type II Diabetes Genitourinary Patient has history Deborah End Stage Renal  Disease - dialysis Musculoskeletal Patient has history Deborah Osteoarthritis Neurologic Patient has history Deborah Neuropathy, Seizure Disorder Patient is treated with Insulin. Blood sugar is tested. Review Deborah Systems (ROS) Constitutional Symptoms (General Health) The patient has no complaints or symptoms. Eyes Complains or has symptoms Deborah Glasses / Contacts, blurred Ear/Nose/Mouth/Throat The patient has no complaints or symptoms. Respiratory The patient has no complaints or symptoms. Cardiovascular stroke Gastrointestinal constipation Immunological The patient has no complaints or symptoms. Integumentary (Skin) Complains or has symptoms Deborah Wounds. Neurologic memory loss Psychiatric Complains or has symptoms Deborah Anxiety. Objective Deborah Hendricks, Deborah Hendricks ([id number]) Constitutional Sitting or standing Blood Pressure is within target range for patient.. Pulse regular and within target range for patient.[name] Respirations regular, non-labored and within target range.. Temperature is normal and within the target range for the patient.. Patient's appearance is neat and clean. Appears in no acute distress. Well nourished and well developed.. Vitals Time Taken: 10:14 AM, Weight: 150 lbs, Source: Stated, Temperature: 97.9 F, Pulse: 48 bpm, Respiratory Rate: 16 breaths/min, Blood Pressure: 106/54 mmHg. Respiratory Respiratory effort is easy and symmetric bilaterally. Rate is normal at rest and on room air.. Bilateral breath sounds are clear and equal in all lobes with no wheezes, rales or rhonchi.. Cardiovascular Heart rhythm  and rate regular, without murmur or gallop.. Faintly palpable. Right dorsalis pedis and posterior tibial pulses are absent. Lymphatic None palpable in the popliteal or inguinal area.. Neurological Absent vibration sense. Psychiatric No evidence Deborah depression, anxiety, or agitation. Calm, cooperative, and communicative. Appropriate interactions and affect.. General Notes:  Wound exam; on the remaining right first toe plantar aspect there is an open area here with nonviable tissue around it. I did not remove this today giving the recent revascularization a chance to see what effect this will have. There is also what appears to be gangrenous change over the medial aspect Deborah the right first metatarsal head. I did not remove this either preferring to see what will happen after a period Deborah revascularization. She has an open area on the right anterior patella this was covered in necrotic surface tissue which was removed with pickups and a scalpel. She tolerated this well. The base tissue underneath this actually looks quite healthy Integumentary (Hair, Skin) Multiple non-specific areas on the right leg even without open wounds. Some Deborah these look like scratch marks [from her cat]. Perhaps necrobiosis on the right anterior leg. Wound #1 status is Open. Original cause Deborah wound was Blister. The wound is located on the [employer]. The wound measures 0.1cm length x 0.1cm width x 0.1cm depth; 0.008cm^2 area and 0.001cm^3 volume. The wound is limited to skin breakdown. There is no tunneling or undermining noted. There is a large amount Deborah serous drainage noted. The wound margin is distinct with the outline attached to the wound base. There is no granulation within the wound bed. There is a large (67-100%) amount Deborah necrotic tissue within the wound bed including Eschar. Wound #2 status is Open. Original cause Deborah wound was Gradually Appeared. The wound is located on the Right,Lateral Metatarsal head first. The wound measures 1.5cm length x 1.5cm width x 0.1cm depth; Deborah Hendricks, Deborah Hendricks ([id number]) 1.767cm^2 area and 0.177cm^3 volume. The wound is limited to skin breakdown. There is no tunneling or undermining noted. There is a large amount Deborah serous drainage noted. The wound margin is distinct with the outline attached to the wound base. There is no granulation within  the wound bed. There is a large (67- 100%) amount Deborah necrotic tissue within the wound bed including Eschar. Wound #3 status is Open. Original cause Deborah wound was Gradually Appeared. The wound is located on the Right,Anterior Knee. The wound measures 2cm length x 2cm width x 0.1cm depth; 3.142cm^2 area and 0.314cm^3 volume. There is no tunneling or undermining noted. There is a large amount Deborah serosanguineous drainage noted. The wound margin is distinct with the outline attached to the wound base. There is no granulation within the wound bed. There is a large (67-100%) amount Deborah necrotic tissue within the wound bed including Eschar and Adherent Slough. 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 There was a Skin/Subcutaneous Tissue Debridement ([id number]) debridement with total area Deborah 4 sq cm performed by Ricard Dillon, MD. with the following instrument(s): Curette to remove Viable and Non-Viable tissue/material including Exudate, Fibrin/Slough, and Subcutaneous after achieving pain control  using Lidocaine 4% Topical Solution. A time out was conducted at 11:20, prior to the start Deborah the procedure. A Minimum amount Deborah bleeding was controlled with Pressure. The procedure was tolerated well with a pain level Deborah 0 throughout and a pain level Deborah 0 following the procedure. Post Debridement Measurements: 2cm length x 2cm width x 0.1cm depth; 0.314cm^3 volume. Character Deborah Wound/Ulcer Post Debridement requires further debridement. Severity Deborah Tissue  Post Debridement is: Fat layer exposed. Post procedure Diagnosis Wound #3: Same as Pre-Procedure Deborah Hendricks, Deborah Hendricks ([id number]) Plan Wound Cleansing: Wound #1 Right,Plantar Toe Great: Clean wound with Normal Saline. Cleanse wound with mild soap and water Wound #2 Right,Lateral Metatarsal head first: Clean wound with Normal Saline. Cleanse wound with mild soap and water Wound #3 Right,Anterior Knee: Clean wound with Normal Saline. Cleanse wound with mild soap and water Anesthetic: Wound #1 Right,Plantar Toe Great: Topical Lidocaine 4% cream applied to wound bed prior to debridement - for clinic use Wound #2 Right,Lateral Metatarsal head first: Topical Lidocaine 4% cream applied to wound bed prior to debridement - for clinic use Wound #3 Right,Anterior Knee: Topical Lidocaine 4% cream applied to wound bed prior to debridement - for clinic use Skin Barriers/Peri-Wound Care: Wound #1 Right,Plantar Toe Great: Skin Prep Wound #2 Right,Lateral Metatarsal head first: Skin Prep Wound #3 Right,Anterior Knee: Skin Prep Primary Wound Dressing: Wound #1 Right,Plantar Toe Great: Santyl Ointment Wound #2 Right,Lateral Metatarsal head first: Santyl Ointment Wound #3 Right,Anterior Knee: Promogran Secondary Dressing: Wound #1 Right,Plantar Toe Great: Dry Gauze Gauze and Kerlix/Conform Foam Other - stretch netting #5 Wound #2 Right,Lateral Metatarsal head first: Dry Gauze Gauze and Kerlix/Conform Foam Wound #3 Right,Anterior Knee: Dry Gauze Non-adherent pad - telfa island Dressing Change Frequency: Wound #1 Right,Plantar Toe Great: Change dressing every day. Wound #2 Right,Lateral Metatarsal head first: Deborah Hendricks, Deborah Hendricks ([id number]) Change dressing every day. Wound #3 Right,Anterior Knee: Change dressing every other day. Follow-up Appointments: Wound #1 Right,Plantar Toe Great: Return Appointment in 1 week. Wound #2 Right,Lateral Metatarsal head first: Return Appointment in 1  week. Wound #3 Right,Anterior Knee: Return Appointment in 1 week. Edema Control: Wound #1 Right,Plantar Toe Great: Elevate legs to the level Deborah the heart and pump ankles as often as possible Wound #2 Right,Lateral Metatarsal head first: Elevate legs to the level Deborah the heart and pump ankles as often as possible Wound #3 Right,Anterior Knee: Elevate legs to the level Deborah the heart and pump ankles as often as possible Off-Loading: Wound #1 Right,Plantar Toe Great: Other: - float and elevate heel when lying in the bed Wound #2 Right,Lateral Metatarsal head first: Other: - float and elevate heel when lying in the bed Wound #3 Right,Anterior Knee: Other: - float and elevate heel when lying in the bed Home Health: Wound #1 Right,Plantar Toe Great: [place] for [place] Nurse may visit PRN to address patient s wound care needs. FACE TO FACE ENCOUNTER: MEDICARE and MEDICAID PATIENTS: I certify that this patient is under my care and that I had a face-to-face encounter that meets the physician face-to-face encounter requirements with this patient on this date. The encounter with the patient was in whole or in part for the following MEDICAL CONDITION: (primary reason for [place]) MEDICAL NECESSITY: I certify, that based on my findings, NURSING services are a medically necessary home health service. HOME BOUND STATUS: I certify that my clinical findings support that this patient is homebound (i.e., Due to illness or injury, pt requires aid  Deborah supportive devices such as crutches, cane, wheelchairs, walkers, the use Deborah special transportation or the assistance Deborah another person to leave their place Deborah residence. There is a normal inability to leave the home and doing so requires considerable and taxing effort. Other absences are for medical reasons / religious services and are infrequent or Deborah short duration when for other reasons). If current dressing  causes regression in wound condition, may D/C ordered dressing product/s and apply Normal Saline Moist Dressing daily until next [place] / Other MD appointment. Deborah Hendricks Deborah Hendricks. Please direct any NON-WOUND related issues/requests for orders to patient's Primary Care Physician Wound #2 Right,Lateral Metatarsal head first: [place] for [place] Nurse may visit PRN to address patient s wound care needs. FACE TO FACE ENCOUNTER: MEDICARE and MEDICAID PATIENTS: I certify that this patient is under my care and that I had a face-to-face encounter that meets the physician face-to-face encounter requirements with this patient on this date. The encounter with the patient was in whole or in part for the following MEDICAL CONDITION: (primary reason for [place]) MEDICAL NECESSITY: I certify, that based on my findings, NURSING services are a medically necessary home health service. HOME BOUND STATUS: I certify that my clinical findings support that this patient is homebound (i.e., Due to [place] ([id number]) illness or injury, pt requires aid Deborah supportive devices such as crutches, cane, wheelchairs, walkers, the use Deborah special transportation or the assistance Deborah another person to leave their place Deborah residence. There is a normal inability to leave the home and doing so requires considerable and taxing effort. Other absences are for medical reasons / religious services and are infrequent or Deborah short duration when for other reasons). If current dressing causes regression in wound condition, may D/C ordered dressing product/s and apply Normal Saline Moist Dressing daily until next [place] / Other MD appointment. [place] Deborah regression in wound condition at [phone]. Please direct any NON-WOUND related issues/requests for orders to patient's Primary  Care Physician Wound #3 Right,Anterior Knee: [place] for [place] Nurse may visit PRN to address patient s wound care needs. FACE TO FACE ENCOUNTER: MEDICARE and MEDICAID PATIENTS: I certify that this patient is under my care and that I had a face-to-face encounter that meets the physician face-to-face encounter requirements with this patient on this date. The encounter with the patient was in whole or in part for the following MEDICAL CONDITION: (primary reason for [place]) MEDICAL NECESSITY: I certify, that based on my findings, NURSING services are a medically necessary home health service. HOME BOUND STATUS: I certify that my clinical findings support that this patient is homebound (i.e., Due to illness or injury, pt requires aid Deborah supportive devices such as crutches, cane, wheelchairs, walkers, the use Deborah special transportation or the assistance Deborah another person to leave their place Deborah residence. There is a normal inability to leave the home and doing so requires considerable and taxing effort. Other absences are for medical reasons / religious services and are infrequent or Deborah short duration when for other reasons). If current dressing causes regression in wound condition, may D/C ordered dressing product/s and apply Normal Saline Moist Dressing daily until next [place] / Other MD appointment. [place] Deborah regression in wound condition at [phone]. Please direct any NON-WOUND related issues/requests for orders  to patient's Primary Care Physician 1 Santyl to the planter right first toe and the right first met head. See how this softens 2 promogran to the right patella 3 iw ill need to see what infor is avaialable in EPIC wrt to these wounds Electronic Signature(s) Signed: 12/11/2016 7:55:07 AM By: Gretta Cool RN, BSN, Kim RN, BSN Signed: 12/11/2016 5:54:02 PM By: Linton Ham MD Previous Signature:  12/07/2016 8:09:16 AM Version By: Gretta Cool RN, BSN, Kim RN, BSN Previous Signature: 12/05/2016 6:11:14 PM Version By: Linton Ham MD Entered By: Gretta Cool RN, BSN, Kim on 12/11/2016 07:55:07 Deborah Hendricks ([id number]) -------------------------------------------------------------------------------- ROS/PFSH Details Patient Name: Deborah Hendricks Date Deborah Service: 12/05/2016 9:30 AM Medical Record Patient Account Number: [id number] Number: Treating RN: Ahmed Prima 08-20-1946 (70 y.o. Other Clinician: Date Deborah Birth/Sex: Female) Treating Jesyka Slaght Primary Care Provider: Wendy Poet Provider/Extender: G Referring Provider: Daphene Jaeger in Treatment: 0 Information Obtained From Patient Wound History Do you currently have one or more open woundso Yes How many open wounds do you currently haveo 2 Approximately how long have you had your woundso 2 months How have you been treating your wound(s) until nowo nothing Has your wound(s) ever healed and then re-openedo No Have you had any lab work done in the past montho No Have you tested positive for an antibiotic resistant organism (MRSA, VRE)o No Have you tested positive for osteomyelitis (bone infection)o No Have you had any tests for circulation on your legso No Have you had other problems associated with your woundso Swelling Eyes Complaints and Symptoms: Positive for: Glasses / Contacts Review Deborah System Notes: blurred Integumentary (Skin) Complaints and Symptoms: Positive for: Wounds Psychiatric Complaints and Symptoms: Positive for: Anxiety Constitutional Symptoms (General Health) Complaints and Symptoms: No Complaints or Symptoms Ear/Nose/Mouth/Throat LENNOX, LEIKAM ([id number]) Complaints and Symptoms: No Complaints or Symptoms Hematologic/Lymphatic Medical History: Positive for: Anemia Respiratory Complaints and Symptoms: No Complaints or Symptoms Cardiovascular Complaints and Symptoms: Review  Deborah System Notes: stroke Medical History: Positive for: Arrhythmia - a-fib; Congestive Heart Failure; Hypertension; Peripheral Venous Disease Gastrointestinal Complaints and Symptoms: Review Deborah System Notes: constipation Endocrine Medical History: Positive for: Type II Diabetes Time with diabetes: since she was 24 years ago Treated with: Insulin Blood sugar tested every day: Yes Tested : before meals Genitourinary Medical History: Positive for: End Stage Renal Disease - dialysis Immunological Complaints and Symptoms: No Complaints or Symptoms Musculoskeletal Medical History: Positive for: Osteoarthritis TEKILA, CAILLOUET ([id number]) Neurologic Complaints and Symptoms: Review Deborah System Notes: memory loss Medical History: Positive for: Neuropathy; Seizure Disorder Immunizations Pneumococcal Vaccine: Received Pneumococcal Vaccination: No Family and Social History Cancer: Yes - Mother, Siblings; Diabetes: Yes - Siblings; Heart Disease: No; Hereditary Spherocytosis: No; Hypertension: Yes - Siblings; Kidney Disease: Yes - Siblings; Lung Disease: No; Seizures: No; Stroke: No; Thyroid Problems: No; Tuberculosis: No; Former smoker - quit 3-4 years ago; Marital Status - Divorced; Alcohol Use: Never; Drug Use: No History; Caffeine Use: Daily; Financial Concerns: No; Food, Clothing or Shelter Needs: No; Support System Lacking: No; Transportation Concerns: No; Advanced Directives: No; Patient does not want information on Advanced Directives; Do not resuscitate: No; Living Will: No; Medical Power Deborah Attorney: No Electronic Signature(s) Signed: 12/05/2016 6:11:14 PM By: Linton Ham MD Signed: 12/06/2016 3:37:28 PM By: Alric Quan Entered By: Alric Quan on 12/05/2016 10:32:45 Deborah Hendricks ([id number]) -------------------------------------------------------------------------------- SuperBill Details Patient Name: Deborah Hendricks Date Deborah Service: 12/05/2016 Medical Record  Patient Account Number: [id number] Number: Treating RN: 21-Jan-1946 (71 y.o. Other Clinician: Date Deborah Birth/Sex: Female)  Treating Zianne Schubring, [place] Primary Care Provider: Wendy Poet Provider/Extender: G Referring Provider: Daphene Jaeger in Treatment: 0 Diagnosis Coding ICD-10 Codes Code Description E11.52 Type 2 diabetes mellitus with diabetic peripheral angiopathy with gangrene E11.42 Type 2 diabetes mellitus with diabetic polyneuropathy L97.511 Non-pressure chronic ulcer Deborah other part Deborah right foot limited to breakdown Deborah skin L97.818 Non-pressure chronic ulcer Deborah other part Deborah right lower leg with other specified severity Facility Procedures CPT4: Description Modifier Quantity Code [id number] 99213 - WOUND CARE VISIT-LEV 3 EST PT 1 CPT4: [id number] 11042 - DEB SUBQ TISSUE 20 SQ CM/< 1 ICD-10 Description Diagnosis L97.818 Non-pressure chronic ulcer Deborah other part Deborah right lower leg with other specified severity Physician Procedures CPT4: Description Modifier Quantity Code [id number] WC PHYS LEVEL 3 o NEW PT 1 ICD-10 Description Diagnosis E11.52 Type 2 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

## 2016-12-07 NOTE — Progress Notes (Signed)
Deborah, Hendricks ([id number]) Visit Report for 12/05/2016 Abuse/Suicide Risk Screen Details Patient Name: Deborah Hendricks, Deborah Hendricks Date of Service: 12/05/2016 9:30 AM Medical Record Patient Account Number: [id number] Number: Treating RN: Ahmed Prima June 25, 1946 (71 y.o. Other Clinician: Date of Birth/Sex: Female) Treating ROBSON, MICHAEL Primary Care Deepak Bless: Wendy Poet Deneene Tarver/Extender: G Referring Aaryav Hopfensperger: Daphene Jaeger in Treatment: 0 Abuse/Suicide Risk Screen Items Answer ABUSE/SUICIDE RISK SCREEN: Has anyone close to you tried to hurt or harm you recentlyo No Do you feel uncomfortable with anyone in your familyo No Has anyone forced you do things that you didnot want to doo No Do you have any thoughts of harming yourselfo No Patient displays signs or symptoms of abuse and/or neglect. No Electronic Signature(s) Signed: 12/06/2016 3:37:28 PM By: Alric Quan Entered By: Alric Quan on 12/05/2016 10:32:52 Deborah Hendricks ([id number]) -------------------------------------------------------------------------------- Activities of Daily Living Details Patient Name: Deborah Hendricks Date of Service: 12/05/2016 9:30 AM Medical Record Patient Account Number: [id number] Number: Treating RN: Ahmed Prima [birth year] (71 y.o. Other Clinician: Date of Birth/Sex: Female) Treating ROBSON, MICHAEL Primary Care Isabele Lollar: Wendy Poet Brandon Wiechman/Extender: G Referring Nikitha Mode: Daphene Jaeger in Treatment: 0 Activities of Daily Living Items Answer Activities of Daily Living (Please select one for each item) Drive Automobile Not Able Take Medications Need Assistance Use Telephone Need Assistance Care for Appearance Need Assistance Use Toilet Need Assistance Bath / Shower Not Able Dress Self Need Assistance Feed Self Completely Able Walk Not Able Get In / Out Bed Need Assistance Housework Not Able Prepare Meals Not Able Handle Money Not Able Shop for  Self Not Able Electronic Signature(s) Signed: 12/06/2016 3:37:28 PM By: Alric Quan Entered By: Alric Quan on 12/05/2016 10:33:46 Deborah Hendricks ([id number]) -------------------------------------------------------------------------------- Education Assessment Details Patient Name: Deborah Hendricks Date of Service: 12/05/2016 9:30 AM Medical Record Patient Account Number: [id number] Number: Treating RN: Ahmed Prima Jan 13, 1946 (71 y.o. Other Clinician: Date of Birth/Sex: Female) Treating ROBSON, MICHAEL Primary Care Lianna Sitzmann: Wendy Poet Cathey [place]/Extender: G Referring Jakevious Hollister: Daphene Jaeger in Treatment: 0 Primary Learner Assessed: Patient Learning Preferences/Education Level/Primary Language Learning Preference: Explanation, Printed Material Highest Education Level: College or Above Preferred Language: English Cognitive Barrier Assessment/Beliefs Language Barrier: No Translator Needed: No Memory Deficit: No Emotional Barrier: No Cultural/Religious Beliefs Affecting Medical No Care: Physical Barrier Assessment Impaired Vision: Yes Glasses Impaired Hearing: Yes Hearing Aid Decreased Hand dexterity: No Knowledge/Comprehension Assessment Knowledge Level: High Comprehension Level: High Ability to understand written High instructions: Ability to understand verbal High instructions: Motivation Assessment Anxiety Level: Calm Cooperation: Cooperative Education Importance: Acknowledges Need Interest in Health Problems: Asks Questions Perception: Coherent Willingness to Engage in Self- High Management Activities: Readiness to Engage in Self- High Management Activities: Deborah Hendricks ([id number]) Electronic Signature(s) Signed: 12/06/2016 3:37:28 PM By: Alric Quan Entered By: Alric Quan on 12/05/2016 10:34:10 Deborah Hendricks ([id number]) -------------------------------------------------------------------------------- Fall Risk  Assessment Details Patient Name: Deborah Hendricks Date of Service: 12/05/2016 9:30 AM Medical Record Patient Account Number: [id number] Number: Treating RN: Ahmed Prima 1946-10-11 (71 y.o. Other Clinician: Date of Birth/Sex: Female) Treating ROBSON, MICHAEL Primary Care Armandina Iman: Wendy Poet Jennelle Pinkstaff/Extender: G Referring Samiksha Pellicano: Daphene Jaeger in Treatment: 0 Fall Risk Assessment Items Have you had 2 or more falls in the last 12 monthso 0 Yes Have you had any fall that resulted in injury in the last 12 monthso 0 No FALL RISK ASSESSMENT: History of falling - immediate or within 3 months 25 Yes Secondary diagnosis 15 Yes Ambulatory aid None/bed rest/wheelchair/nurse 0 Yes Crutches/cane/walker 0 No Furniture 0  No IV Access/Saline Lock 0 No Gait/Training Normal/bed rest/immobile 0 No Weak 0 No Impaired 20 Yes Mental Status Oriented to own ability 0 Yes Electronic Signature(s) Signed: 12/06/2016 3:37:28 PM By: Alric Quan Entered By: Alric Quan on 12/05/2016 10:35:31 Deborah Hendricks ([id number]) -------------------------------------------------------------------------------- Foot Assessment Details Patient Name: Deborah Hendricks Date of Service: 12/05/2016 9:30 AM Medical Record Patient Account Number: [id number] Number: Treating RN: Ahmed Prima 09-Mar-1946 (71 y.o. Other Clinician: Date of Birth/Sex: Female) Treating ROBSON, MICHAEL Primary Care Kalysta Kneisley: Wendy Poet Ellia Knowlton/Extender: G Referring Braylen Staller: Daphene Jaeger in Treatment: 0 Foot Assessment Items [x]  Unable to perform left foot assessment due to amputation Site Locations + = Sensation present, - = Sensation absent, C = Callus, U = Ulcer R = Redness, W = Warmth, M = Maceration, PU = Pre-ulcerative lesion F = Fissure, S = Swelling, D = Dryness Assessment Right: Left: Other Deformity: No Prior Foot Ulcer: No Prior Amputation: No Charcot Joint: No Ambulatory  Status: Non-ambulatory Gait: Electronic Signature(s) Signed: 12/06/2016 3:37:28 PM By: Alric Quan Entered By: Alric Quan on 12/05/2016 10:36:03 Deborah Hendricks ([id number] Hendricks, Deborah Hendricks ([id number]) -------------------------------------------------------------------------------- Nutrition Risk Assessment Details Patient Name: Deborah Hendricks Date of Service: 12/05/2016 9:30 AM Medical Record Patient Account Number: [id number] Number: Treating RN: Ahmed Prima 17-Dec-1945 (70 y.o. Other Clinician: Date of Birth/Sex: Female) Treating ROBSON, MICHAEL Primary Care Shanessa Hodak: Wendy Poet Rodrigues [place]/Extender: G Referring Orpah Hausner: Daphene Jaeger in Treatment: 0 Height (in): Weight (lbs): 150 Body Mass Index (BMI): Nutrition Risk Assessment Items NUTRITION RISK SCREEN: I have an illness or condition that made me change the kind and/or 0 No amount of food I eat I eat fewer than two meals per day 0 No I eat few fruits and vegetables, or milk products 0 No I have three or more drinks of beer, liquor or wine almost every day 0 No I have tooth or mouth problems that make it hard for me to eat 0 No I don't always have enough money to buy the food I need 0 No I eat alone most of the time 0 No I take three or more different prescribed or over-the-counter drugs a 0 No day Without wanting to, I have lost or gained 10 pounds in the last six 0 No months I am not always physically able to shop, cook and/or feed myself 2 Yes Nutrition Protocols Good Risk Protocol Moderate Risk Protocol Electronic Signature(s) Signed: 12/06/2016 3:37:28 PM By: Alric Quan Entered By: Alric Quan on 12/05/2016 10:35:42

## 2016-12-07 NOTE — Progress Notes (Addendum)
Deborah, Hendricks ([id number]) Visit Report for 12/05/2016 Allergy List Details Patient Name: Deborah Hendricks, Deborah Hendricks Date of Service: 12/05/2016 9:30 AM Medical Record Number: [id number] Patient Account Number: [id number] Date of Birth/Sex: 07-06-1946 (71 y.o. Female) Treating RN: Carolyne Fiscal, Debi Primary Care Jomel Whittlesey: Wendy Poet Other Clinician: Referring Terrick Allred: Harold Barban Treating Deidrea Gaetz/Extender: Ricard Dillon Weeks in Treatment: 0 Allergies Active Allergies sulfur penicillin Allergy Notes Electronic Signature(s) Signed: 12/06/2016 3:37:28 PM By: Alric Quan Entered By: Alric Quan on 12/05/2016 10:17:02 Deborah Hendricks ([id number]) -------------------------------------------------------------------------------- Arrival Information Details Patient Name: Deborah Hendricks Date of Service: 12/05/2016 9:30 AM Medical Record Number: [id number] Patient Account Number: [id number] Date of Birth/Sex: 26-Jan-1946 (71 y.o. Female) Treating RN: Carolyne Fiscal, Debi Primary Care Rayelynn Loyal: Wendy Poet Other Clinician: Referring Kamarion Zagami: Harold Barban Treating Chaze Hruska/Extender: Tito Dine in Treatment: 0 Visit Information Patient Arrived: Wheel Chair Arrival Time: 10:13 Accompanied By: Deborah Hendricks Transfer Assistance: EasyPivot Patient Lift Patient Identification Verified: Yes Secondary Verification Process Yes Completed: Patient Requires Transmission- No Based Precautions: Patient Has Alerts: Yes Patient Alerts: DM II ELIQUIS Electronic Signature(s) Signed: 12/06/2016 3:37:28 PM By: Alric Quan Entered By: Alric Quan on 12/05/2016 10:45:57 Deborah Hendricks ([id number]) -------------------------------------------------------------------------------- Clinic Level of Care Assessment Details Patient Name: Deborah Hendricks Date of Service: 12/05/2016 9:30 AM Medical Record Number: [id number] Patient Account Number: [id number] Date of Birth/Sex: 06-29-1946 (70 y.o.  Female) Treating RN: Carolyne Fiscal, Debi Primary Care Killian Ress: Wendy Poet Other Clinician: Referring Reice Bienvenue: Harold Barban Treating Yamira Papa/Extender: Tito Dine in Treatment: 0 Clinic Level of Care Assessment Items TOOL 1 Quantity Score X - Use when EandM and Procedure is performed on INITIAL visit 1 0 ASSESSMENTS - Nursing Assessment / Reassessment X - General Physical Exam (combine w/ comprehensive assessment (listed just 1 20 below) when performed on new pt. evals) X - Comprehensive Assessment (HX, ROS, Risk Assessments, Wounds Hx, etc.) 1 25 ASSESSMENTS - Wound and Skin Assessment / Reassessment []  - Dermatologic / Skin Assessment (not related to wound area) 0 ASSESSMENTS - Ostomy and/or Continence Assessment and Care []  - Incontinence Assessment and Management 0 []  - Ostomy Care Assessment and Management (repouching, etc.) 0 PROCESS - Coordination of Care []  - Simple Patient / Family Education for ongoing care 0 X - Complex (extensive) Patient / Family Education for ongoing care 1 20 X - Staff obtains Programmer, systems, Records, Test Results / Process Orders 1 10 X - Staff telephones HHA, Nursing Homes / Clarify orders / etc 1 10 []  - Routine Transfer to another Facility (non-emergent condition) 0 []  - Routine Hospital Admission (non-emergent condition) 0 X - New Admissions / Biomedical engineer / Ordering NPWT, Apligraf, etc. 1 15 []  - Emergency Hospital Admission (emergent condition) 0 PROCESS - Special Needs []  - Pediatric / Minor Patient Management 0 []  - Isolation Patient Management 0 ROSY, CURL ([id number]) []  - Hearing / Language / Visual special needs 0 []  - Assessment of Community assistance (transportation, D/C planning, etc.) 0 []  - Additional assistance / Altered mentation 0 []  - Support Surface(s) Assessment (bed, cushion, seat, etc.) 0 INTERVENTIONS - Miscellaneous []  - External ear exam 0 []  - Patient Transfer (multiple staff / Civil Service fast streamer /  Similar devices) 0 []  - Simple Staple / Suture removal (25 or less) 0 []  - Complex Staple / Suture removal (26 or more) 0 []  - Hypo/Hyperglycemic Management (do not check if billed separately) 0 []  - Ankle / Brachial Index (ABI) - do not check if billed separately 0 Has the patient been seen at the hospital within the  last three years: Yes Total Score: 100 Level Of Care: New/Established - Level 3 Electronic Signature(s) Signed: 12/06/2016 3:37:28 PM By: Alric Quan Entered By: Alric Quan on 12/05/2016 13:20:49 Deborah Hendricks ([id number]) -------------------------------------------------------------------------------- Encounter Discharge Information Details Patient Name: Deborah Hendricks Date of Service: 12/05/2016 9:30 AM Medical Record Number: [id number] Patient Account Number: [id number] Date of Birth/Sex: February 02, 1946 (71 y.o. Female) Treating RN: Primary Care Iverson Sees: Wendy Poet Other Clinician: Referring Jennah Satchell: Harold Barban Treating Theta Leaf/Extender: Tito Dine in Treatment: 0 Encounter Discharge Information Items Discharge Pain Level: 0 Discharge Condition: Stable Ambulatory Status: Wheelchair Discharge Destination: Home Transportation: Private Auto Accompanied By: daughter Schedule Follow-up Appointment: Yes Medication Reconciliation completed and provided to Patient/Care No Deborah Hendricks: Provided on Clinical Summary of Care: 12/05/2016 Form Type Recipient Paper Patient [facility] Electronic Signature(s) Signed: 12/06/2016 3:37:28 PM By: Alric Quan Previous Signature: 12/05/2016 11:58:00 AM Version By: Ruthine Dose Entered By: Alric Quan on 12/05/2016 12:30:26 Deborah Hendricks ([id number]) -------------------------------------------------------------------------------- Lower Extremity Assessment Details Patient Name: Deborah Hendricks Date of Service: 12/05/2016 9:30 AM Medical Record Number: [id number] Patient Account Number: [id number] Date of  Birth/Sex: 12-06-1945 (70 y.o. Female) Treating RN: Carolyne Fiscal, Debi Primary Care Vedanshi Massaro: Wendy Poet Other Clinician: Referring Donnah Levert: Harold Barban Treating Duron Meister/Extender: Tito Dine in Treatment: 0 Vascular Assessment Pulses: Dorsalis Pedis Palpable: [Right:No] Doppler Audible: [Right:Inaudible] Posterior Tibial Extremity colors, hair growth, and conditions: Extremity Color: [Right:Hyperpigmented] Hair Growth on Extremity: [Right:No] Temperature of Extremity: [Right:Cold] Capillary Refill: [Right:> 3 seconds] Electronic Signature(s) Signed: 12/06/2016 3:37:28 PM By: Alric Quan Entered By: Alric Quan on 12/05/2016 10:45:30 Deborah Hendricks ([id number]) -------------------------------------------------------------------------------- Multi Wound Chart Details Patient Name: Deborah Hendricks Date of Service: 12/05/2016 9:30 AM Medical Record Number: [id number] Patient Account Number: [id number] Date of Birth/Sex: 1946-10-20 (70 y.o. 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] [2:the Lower Extremity] [3:the Lower Extremity] Comorbid History: [1:Anemia, Arrhythmia, Congestive Heart Failure, Congestive Heart Failure, Congestive Heart Failure, Hypertension, Peripheral Hypertension, Peripheral  Hypertension, Peripheral Venous Disease, Type II Venous Disease, Type II Venous  Disease, Type II Diabetes, End Stage Renal Disease, Osteoarthritis, Neuropathy, Seizure Disorder] [2:Anemia, Arrhythmia, Diabetes, End Stage Renal Disease, Osteoarthritis, Neuropathy, Seizure Disorder] [3:Anemia, Arrhythmia, Diabetes, End Stage Renal  Disease, Osteoarthritis, Neuropathy, Seizure Disorder] Date Acquired: [1:10/05/2016] [2:10/05/2016] [3:11/04/2016] Weeks of Treatment: [1:0] [2:0] [3:0] Wound Status: [1:Open] [2:Open] [3:Open] Pending Amputation on Yes [2:No] [3:No] Presentation: Measurements L x W x D 0.1x0.1x0.1 [2:1.5x1.5x0.1] [3:2x2x0.1] (cm) Area (cm) : [1:0.008] [2:1.767] [3:3.142] Volume (cm) : [1:0.001] [2:0.177] [3:0.314] Classification: [1:Grade 2] [2:Grade 2] [3:Grade 1] Exudate Amount: [1:None Present] [2:None Present] [3:Large] Exudate Type: [1:N/A] [2:N/A] [3:Serosanguineous] Exudate Color: [1:N/A] [2:N/A] [3:red, brown] Wound Margin: [1:Distinct, outline attached Distinct, outline attached Distinct, outline attached] Granulation Amount: [1:None Present (0%)] [2:None Present (0%)] [3:None Present (0%)] Necrotic Amount: Large (67-100%) Large (67-100%) Large (67-100%) Necrotic Tissue: Eschar Eschar Eschar, Adherent Slough Exposed Structures: Fascia: No Fascia: No N/A Fat Layer (Subcutaneous Fat Layer (Subcutaneous Tissue) Exposed: No Tissue) Exposed: No Tendon: No Tendon: No Muscle: No Muscle: No Joint: No Joint: No Bone:  No Bone: No Limited to Skin Limited to Skin Breakdown Breakdown Epithelialization: None None None Debridement: N/A N/A Debridement (11042- 11047) Pre-procedure N/A N/A 11:20 Verification/Time Out Taken: Pain Control: N/A N/A Lidocaine 4% Topical Solution Tissue Debrided: N/A N/A Fibrin/Slough, Exudates, Subcutaneous Level: N/A N/A Skin/Subcutaneous Tissue Debridement Area (sq N/A N/A 4 cm): Instrument: N/A N/A Curette Bleeding: N/A N/A  Minimum Hemostasis Achieved: N/A N/A Pressure Procedural Pain: N/A N/A 0 Post Procedural Pain: N/A N/A 0 Debridement Treatment N/A N/A Procedure was tolerated Response: well Post Debridement N/A N/A 2x2x0.1 Measurements L x W x D (cm) Post Debridement N/A N/A 0.314 Volume: (cm) Periwound Skin Texture: No Abnormalities Noted No Abnormalities Noted No Abnormalities Noted Periwound Skin No Abnormalities Noted No Abnormalities Noted No Abnormalities Noted Moisture: Periwound Skin Color: No Abnormalities Noted No Abnormalities Noted No Abnormalities Noted Tenderness on No No No Palpation: Wound Preparation: Ulcer Cleansing: Ulcer Cleansing: Ulcer Cleansing: Rinsed/Irrigated with Rinsed/Irrigated with Rinsed/Irrigated with Saline Saline Saline Topical Anesthetic Topical Anesthetic Topical Anesthetic Applied: Other: lidocaine Applied: Other: lidocaine Applied: Other: lidocaine 4% 4% 4% IZELLAH, LESNER ([id number]) Procedures Performed: N/A N/A Debridement Treatment Notes Wound #1 (Right, Plantar Toe Great) 1. Cleansed with: Clean wound with Normal Saline 2. Anesthetic Topical Lidocaine 4% cream to wound bed prior to debridement 4. Dressing Applied: Santyl Ointment 5. Secondary Dressing Applied Foam Gauze and Kerlix/Conform 7. Secured with Tape Wound #2 (Right, Lateral Metatarsal head first) 1. Cleansed with: Clean wound with Normal Saline 2. Anesthetic Topical Lidocaine 4% cream to wound bed prior to debridement 4. Dressing Applied: Santyl Ointment 5. Secondary Dressing Applied Foam Gauze and Kerlix/Conform 7. Secured with Tape Wound #3 (Right, Anterior Knee) 1. Cleansed with: Clean wound with Normal Saline 2. Anesthetic Topical Lidocaine 4% cream to wound bed prior to debridement 3. Peri-wound Care: Skin Prep 4. Dressing Applied: Promogran 5. Secondary Dressing Applied Dry [place] Signature(s) Signed: 12/05/2016 6:11:14 PM By: Linton Ham MD Entered By: Linton Ham on 12/05/2016 12:45:09 ADVITA, HALLIWILL ([id number]) TERRIANA, KNOFF ([id number]) -------------------------------------------------------------------------------- Multi-Disciplinary Care Plan Details Patient Name: Deborah Hendricks Date of Service: 12/05/2016 9:30 AM Medical Record Number: [id number] Patient Account Number: [id number] Date of Birth/Sex: Jul 13, 1946 (70 y.o. Female) Treating RN: Carolyne Fiscal, Debi Primary Care Tonji Elliff: Wendy Poet Other Clinician: Referring Bill Mcvey: Harold Barban Treating Sunita Demond/Extender: Tito Dine in Treatment: 0 Active Inactive ` Abuse / Safety / Falls / Self Care Management Nursing Diagnoses: Potential for falls Goals: Patient will remain injury free Date Initiated: 12/05/2016 Target Resolution Date: 03/09/2017 Goal Status: Active Patient/caregiver will verbalize/demonstrate measure taken to improve self care Date Initiated: 12/05/2016 Target Resolution Date: 03/09/2017 Goal Status: Active Interventions: Assess fall risk on admission and as needed Assess self care needs on admission and as needed Notes: ` Nutrition Nursing Diagnoses: Imbalanced nutrition Impaired glucose control: actual or potential Potential for alteratiion in Nutrition/Potential for imbalanced nutrition Goals: Patient/caregiver will maintain therapeutic glucose control Date Initiated: 12/05/2016 Target Resolution Date: 03/09/2017 Goal Status: Active Interventions: Assess patient nutrition upon admission and as needed per policy Provide education on elevated blood sugars and impact on wound healing LAYNI, PELTZ ([id number]) Notes: ` Orientation to the Wound Care Program Nursing Diagnoses: Knowledge deficit related to the wound healing center program Goals: Patient/caregiver will verbalize understanding of the [facility] Date Initiated: 12/05/2016 Target Resolution Date: 12/22/2016 Goal Status:  Active Interventions: Provide education on orientation to the wound center Notes: ` Wound/Skin Impairment Nursing Diagnoses: Impaired tissue integrity Knowledge deficit related to smoking impact  on wound healing Knowledge deficit related to ulceration/compromised skin integrity Goals: Ulcer/skin breakdown will have a volume reduction of 80% by week 12 Date Initiated: 12/05/2016 Target Resolution Date: 03/09/2017 Goal Status: Active Interventions: Assess patient/caregiver ability to perform ulcer/skin care regimen upon admission and as needed Assess ulceration(s) every visit Notes: Electronic Signature(s) Signed: 12/06/2016 3:37:28 PM By: Alric Quan Entered By: Alric Quan on 12/05/2016 11:20:02 Deborah Hendricks ([id number]) -------------------------------------------------------------------------------- Pain Assessment Details Patient Name: Deborah Hendricks Date of Service: 12/05/2016 9:30 AM Medical Record Number: [id number] Patient Account Number: [id number] Date of Birth/Sex: 1946-06-06 (71 y.o. Female) Treating RN: Carolyne Fiscal, Debi Primary Care Nilsa Macht: Wendy Poet Other Clinician: Referring Broughton Eppinger: Harold Barban Treating Statia Burdick/Extender: Tito Dine in Treatment: 0 Active Problems Location of Pain Severity and Description of Pain Patient Has Paino No Site Locations With Dressing Change: No Pain Management and Medication Current Pain Management: Electronic Signature(s) Signed: 12/06/2016 3:37:28 PM By: Alric Quan Entered By: Alric Quan on 12/05/2016 10:14:43 Deborah Hendricks ([id number]) -------------------------------------------------------------------------------- Patient/Caregiver Education Details Patient Name: Deborah Hendricks Date of Service: 12/05/2016 9:30 AM Medical Record Patient Account Number: [id number] Number: Treating RN: Ahmed Prima September 21, 1946 (70 y.o. Other Clinician: Date of Birth/Gender: Female) Treating  ROBSON, MICHAEL Primary Care Physician: Wendy Poet Physician/Extender: G Referring Physician: Daphene Jaeger in Treatment: 0 Education Assessment Education Provided To: Patient Education Topics Provided Elevated Blood Sugar/ Impact on Healing: Handouts: Elevated Blood Sugars: How Do They Affect Wound Healing Methods: Explain/Verbal Responses: State content correctly Welcome To [facility] [place]: Handouts: Welcome To [facility] [place] Methods: Explain/Verbal Responses: State content correctly Wound/Skin Impairment: Handouts: Other: change dressing as ordered Methods: Demonstration, Explain/Verbal Responses: State content correctly Electronic Signature(s) Signed: 12/06/2016 3:37:28 PM By: Alric Quan Entered By: Alric Quan on 12/05/2016 12:31:04 Deborah Hendricks ([id number]) -------------------------------------------------------------------------------- Wound Assessment Details Patient Name: Deborah Hendricks Date of Service: 12/05/2016 9:30 AM Medical Record Number: [id number] Patient Account Number: [id number] Date of Birth/Sex: May 19, 1946 (71 y.o. Female) Treating RN: Carolyne Fiscal, Debi Primary Care Fahad Cisse: Wendy Poet Other Clinician: Referring Dericka Ostenson: Harold Barban Treating Mayeli Bornhorst/Extender: Tito Dine in Treatment: 0 Wound Status Wound Number: 1 Primary Diabetic Wound/Ulcer of the Lower Etiology: Extremity Wound Location: Right Toe Great - Plantar Wound Open Wounding Event: Blister Status: Date Acquired: 10/05/2016 Comorbid Anemia, Arrhythmia, Congestive Heart Weeks Of Treatment: 0 History: Failure, Hypertension, Peripheral Clustered Wound: No Venous Disease, Type II Diabetes, End Pending Amputation On Presentation Stage Renal Disease, Osteoarthritis, Neuropathy, Seizure Disorder Photos Wound Measurements Length: (cm) 0.1 Width: (cm) 0.1 Depth: (cm) 0.1 Area: (cm) 0.008 Volume: (cm) 0.001 % Reduction in Area:  0% % Reduction in Volume: 0% Epithelialization: None Tunneling: No Undermining: No Wound Description Classification: Grade 2 Foul Odor Afte Wound Margin: Distinct, outline attached Slough/Fibrino Exudate Amount: Large Exudate Type: Serous Exudate Color: amber r Cleansing: No No Wound Bed Granulation Amount: None Present (0%) Exposed Structure Necrotic Amount: Large (67-100%) Fascia Exposed: No Necrotic Quality: Eschar Fat Layer (Subcutaneous Tissue) Exposed: No Tendon Exposed: No Muscle Exposed: No Baba, Mariann Laster ([id number]) Joint Exposed: No Bone Exposed: No Limited to Skin Breakdown Periwound Skin Texture Texture Color No Abnormalities Noted: No No Abnormalities Noted: No Moisture No Abnormalities Noted: No Wound Preparation Ulcer Cleansing: Rinsed/Irrigated with Saline Topical Anesthetic Applied: Other: lidocaine 4%, Treatment Notes Wound #1 (Right, Plantar Toe Great) 1. Cleansed with: Clean wound with Normal Saline 2. Anesthetic Topical Lidocaine 4% cream to wound bed prior to debridement 4. Dressing Applied: Santyl Ointment 5. Secondary Dressing Applied Foam Gauze and  Kerlix/Conform 7. Secured with Recruitment consultant) Signed: 12/10/2016 4:23:36 PM By: Alric Quan Previous Signature: 12/06/2016 3:37:28 PM Version By: Alric Quan Entered By: Alric Quan on 12/10/2016 15:39:57 Deborah Hendricks ([id number]) -------------------------------------------------------------------------------- Wound Assessment Details Patient Name: Deborah Hendricks Date of Service: 12/05/2016 9:30 AM Medical Record Number: [id number] Patient Account Number: [id number] Date of Birth/Sex: 02/02/1946 (71 y.o. Female) Treating RN: Carolyne Fiscal, Debi Primary Care Janisha Bueso: Wendy Poet Other Clinician: Referring Bran Aldridge: Harold Barban Treating Kayleb Warshaw/Extender: Tito Dine in Treatment: 0 Wound Status Wound Number: 2 Primary Diabetic Wound/Ulcer of the  Lower Etiology: Extremity Wound Location: Right Metatarsal head first - Lateral Wound Open Status: Wounding Event: Gradually Appeared Comorbid Anemia, Arrhythmia, Congestive Heart Date Acquired: 10/05/2016 History: Failure, Hypertension, Peripheral Weeks Of Treatment: 0 Venous Disease, Type II Diabetes, End Clustered Wound: No Stage Renal Disease, Osteoarthritis, Neuropathy, Seizure Disorder Photos Wound Measurements Length: (cm) 1.5 Width: (cm) 1.5 Depth: (cm) 0.1 Area: (cm) 1.767 Volume: (cm) 0.177 % Reduction in Area: 0% % Reduction in Volume: 0% Epithelialization: None Tunneling: No Undermining: No Wound Description Classification: Grade 2 Foul Odor Aft Wound Margin: Distinct, outline attached Slough/Fibrin Exudate Amount: Large Exudate Type: Serous Exudate Color: amber er Cleansing: No o No Wound Bed Granulation Amount: None Present (0%) Exposed Structure Necrotic Amount: Large (67-100%) Fascia Exposed: No Necrotic Quality: Eschar Fat Layer (Subcutaneous Tissue) Exposed: No Tendon Exposed: No Muscle Exposed: No Glab, Mariann Laster ([id number]) Joint Exposed: No Bone Exposed: No Limited to Skin Breakdown Periwound Skin Texture Texture Color No Abnormalities Noted: No No Abnormalities Noted: No Moisture No Abnormalities Noted: No Wound Preparation Ulcer Cleansing: Rinsed/Irrigated with Saline Topical Anesthetic Applied: Other: lidocaine 4%, Treatment Notes Wound #2 (Right, Lateral Metatarsal head first) 1. Cleansed with: Clean wound with Normal Saline 2. Anesthetic Topical Lidocaine 4% cream to wound bed prior to debridement 4. Dressing Applied: Santyl Ointment 5. Secondary Dressing Applied Foam Gauze and Kerlix/Conform 7. Secured with Recruitment consultant) Signed: 12/10/2016 4:23:36 PM By: Alric Quan Previous Signature: 12/06/2016 3:37:28 PM Version By: Alric Quan Entered By: Alric Quan on 12/10/2016 15:40:09 Deborah Hendricks ([id number]) -------------------------------------------------------------------------------- Wound Assessment Details Patient Name: Deborah Hendricks Date of Service: 12/05/2016 9:30 AM Medical Record Number: [id number] Patient Account Number: [id number] Date of Birth/Sex: November 02, 1946 (71 y.o. Female) Treating RN: Carolyne Fiscal, Debi Primary Care Madisson Kulaga: Wendy Poet Other Clinician: Referring Aubert Choyce: Harold Barban Treating Karia Ehresman/Extender: Tito Dine in Treatment: 0 Wound Status Wound Number: 3 Primary Diabetic Wound/Ulcer of the Lower Etiology: Extremity Wound Location: Right Knee - Anterior Wound Open Wounding Event: Gradually Appeared Status: Date Acquired: 11/04/2016 Comorbid Anemia, Arrhythmia, Congestive Heart Weeks Of Treatment: 0 History: Failure, Hypertension, Peripheral Clustered Wound: No Venous Disease, Type II Diabetes, End Stage Renal Disease, Osteoarthritis, Neuropathy, Seizure Disorder Photos Photo Uploaded By: Alric Quan on 12/05/2016 13:14:52 Wound Measurements Length: (cm) 2 Width: (cm) 2 Depth: (cm) 0.1 Area: (cm) 3.142 Volume: (cm) 0.314 % Reduction in Area: % Reduction in Volume: Epithelialization: None Tunneling: No Undermining: No Wound Description Classification: Grade 1 Foul Odor Aft Wound Margin: Distinct, outline attached Slough/Fibrin Exudate Amount: Large Exudate Type: Serosanguineous Exudate Color: red, brown er Cleansing: No o No Wound Bed Granulation Amount: None Present (0%) Necrotic Amount: Large (67-100%) Necrotic Quality: Eschar, Adherent [address] [place], [place] ([id number]) Texture Color No Abnormalities Noted: No No Abnormalities Noted: No Moisture No Abnormalities Noted: No Wound Preparation Ulcer Cleansing: Rinsed/Irrigated with Saline Topical Anesthetic Applied: Other: lidocaine 4%, Treatment Notes Wound #3 (Right, Anterior Knee) 1. Cleansed with: Clean  wound with  Normal Saline 2. Anesthetic Topical Lidocaine 4% cream to wound bed prior to debridement 3. Peri-wound Care: Skin Prep 4. Dressing Applied: Promogran 5. Secondary Dressing Applied Dry [place] Signature(s) Signed: 12/06/2016 3:37:28 PM By: Alric Quan Entered By: Alric Quan on 12/05/2016 10:41:32 Deborah Hendricks ([id number]) -------------------------------------------------------------------------------- Vitals Details Patient Name: Deborah Hendricks Date of Service: 12/05/2016 9:30 AM Medical Record Number: [id number] Patient Account Number: [id number] Date of Birth/Sex: 01-10-1946 (71 y.o. Female) Treating RN: Carolyne Fiscal, Debi Primary Care Katsumi Wisler: Wendy Poet Other Clinician: Referring Angelik Walls: Harold Barban Treating Paxson Harrower/Extender: Tito Dine in Treatment: 0 Vital Signs Time Taken: 10:14 Temperature (F): 97.9 Weight (lbs): 150 Pulse (bpm): 48 Source: Stated Respiratory Rate (breaths/min): 16 Blood Pressure (mmHg): 106/54 Reference Range: 80 - 120 mg / dl Electronic Signature(s) Signed: 12/06/2016 3:37:28 PM By: Alric Quan Entered By: Alric Quan on 12/05/2016 10:15:19

## 2016-12-08 DIAGNOSIS — D631 Anemia in chronic kidney disease: Secondary | ICD-10-CM | POA: Diagnosis not present

## 2016-12-08 DIAGNOSIS — E119 Type 2 diabetes mellitus without complications: Secondary | ICD-10-CM | POA: Diagnosis not present

## 2016-12-08 DIAGNOSIS — N2581 Secondary hyperparathyroidism of renal origin: Secondary | ICD-10-CM | POA: Diagnosis not present

## 2016-12-08 DIAGNOSIS — N186 End stage renal disease: Secondary | ICD-10-CM | POA: Diagnosis not present

## 2016-12-10 ENCOUNTER — Encounter: Payer: Medicare Other | Admitting: Physical Therapy

## 2016-12-11 DIAGNOSIS — N2581 Secondary hyperparathyroidism of renal origin: Secondary | ICD-10-CM | POA: Diagnosis not present

## 2016-12-11 DIAGNOSIS — E119 Type 2 diabetes mellitus without complications: Secondary | ICD-10-CM | POA: Diagnosis not present

## 2016-12-11 DIAGNOSIS — D631 Anemia in chronic kidney disease: Secondary | ICD-10-CM | POA: Diagnosis not present

## 2016-12-11 DIAGNOSIS — N186 End stage renal disease: Secondary | ICD-10-CM | POA: Diagnosis not present

## 2016-12-12 ENCOUNTER — Ambulatory Visit: Payer: Medicare Other | Admitting: Internal Medicine

## 2016-12-12 ENCOUNTER — Encounter: Payer: Medicare Other | Admitting: Physical Therapy

## 2016-12-12 DIAGNOSIS — Z992 Dependence on renal dialysis: Secondary | ICD-10-CM | POA: Diagnosis not present

## 2016-12-12 DIAGNOSIS — N186 End stage renal disease: Secondary | ICD-10-CM | POA: Diagnosis not present

## 2016-12-12 DIAGNOSIS — I120 Hypertensive chronic kidney disease with stage 5 chronic kidney disease or end stage renal disease: Secondary | ICD-10-CM | POA: Diagnosis not present

## 2016-12-13 DIAGNOSIS — N2581 Secondary hyperparathyroidism of renal origin: Secondary | ICD-10-CM | POA: Diagnosis not present

## 2016-12-13 DIAGNOSIS — N186 End stage renal disease: Secondary | ICD-10-CM | POA: Diagnosis not present

## 2016-12-13 DIAGNOSIS — D631 Anemia in chronic kidney disease: Secondary | ICD-10-CM | POA: Diagnosis not present

## 2016-12-13 DIAGNOSIS — E119 Type 2 diabetes mellitus without complications: Secondary | ICD-10-CM | POA: Diagnosis not present

## 2016-12-14 ENCOUNTER — Encounter (HOSPITAL_BASED_OUTPATIENT_CLINIC_OR_DEPARTMENT_OTHER): Payer: Medicare Other

## 2016-12-14 ENCOUNTER — Ambulatory Visit: Payer: Medicare Other | Admitting: Surgery

## 2016-12-15 DIAGNOSIS — N2581 Secondary hyperparathyroidism of renal origin: Secondary | ICD-10-CM | POA: Diagnosis not present

## 2016-12-15 DIAGNOSIS — E119 Type 2 diabetes mellitus without complications: Secondary | ICD-10-CM | POA: Diagnosis not present

## 2016-12-15 DIAGNOSIS — N186 End stage renal disease: Secondary | ICD-10-CM | POA: Diagnosis not present

## 2016-12-15 DIAGNOSIS — D631 Anemia in chronic kidney disease: Secondary | ICD-10-CM | POA: Diagnosis not present

## 2016-12-17 ENCOUNTER — Encounter: Payer: Medicare Other | Admitting: Physical Therapy

## 2016-12-18 DIAGNOSIS — N2581 Secondary hyperparathyroidism of renal origin: Secondary | ICD-10-CM | POA: Diagnosis not present

## 2016-12-18 DIAGNOSIS — N186 End stage renal disease: Secondary | ICD-10-CM | POA: Diagnosis not present

## 2016-12-18 DIAGNOSIS — E119 Type 2 diabetes mellitus without complications: Secondary | ICD-10-CM | POA: Diagnosis not present

## 2016-12-18 DIAGNOSIS — D631 Anemia in chronic kidney disease: Secondary | ICD-10-CM | POA: Diagnosis not present

## 2016-12-19 ENCOUNTER — Encounter: Payer: Medicare Other | Attending: Internal Medicine | Admitting: Internal Medicine

## 2016-12-19 ENCOUNTER — Encounter: Payer: Self-pay | Admitting: Emergency Medicine

## 2016-12-19 ENCOUNTER — Emergency Department
Admission: EM | Admit: 2016-12-19 | Discharge: 2016-12-19 | Disposition: A | Discharge status: Home / Self Care | Payer: Medicare Other | Attending: Emergency Medicine | Admitting: Emergency Medicine

## 2016-12-19 ENCOUNTER — Ambulatory Visit: Payer: Medicare Other | Admitting: Internal Medicine

## 2016-12-19 ENCOUNTER — Encounter: Payer: Medicare Other | Admitting: Physical Therapy

## 2016-12-19 DIAGNOSIS — S90421A Blister (nonthermal), right great toe, initial encounter: Secondary | ICD-10-CM | POA: Diagnosis not present

## 2016-12-19 DIAGNOSIS — Z87891 Personal history of nicotine dependence: Secondary | ICD-10-CM | POA: Diagnosis not present

## 2016-12-19 DIAGNOSIS — M62838 Other muscle spasm: Secondary | ICD-10-CM | POA: Insufficient documentation

## 2016-12-19 DIAGNOSIS — L97511 Non-pressure chronic ulcer of other part of right foot limited to breakdown of skin: Secondary | ICD-10-CM | POA: Insufficient documentation

## 2016-12-19 DIAGNOSIS — E1122 Type 2 diabetes mellitus with diabetic chronic kidney disease: Secondary | ICD-10-CM | POA: Insufficient documentation

## 2016-12-19 DIAGNOSIS — Z794 Long term (current) use of insulin: Secondary | ICD-10-CM | POA: Diagnosis not present

## 2016-12-19 DIAGNOSIS — Z992 Dependence on renal dialysis: Secondary | ICD-10-CM | POA: Diagnosis not present

## 2016-12-19 DIAGNOSIS — N186 End stage renal disease: Secondary | ICD-10-CM | POA: Insufficient documentation

## 2016-12-19 DIAGNOSIS — Z79899 Other long term (current) drug therapy: Secondary | ICD-10-CM | POA: Insufficient documentation

## 2016-12-19 DIAGNOSIS — S91301A Unspecified open wound, right foot, initial encounter: Secondary | ICD-10-CM | POA: Diagnosis not present

## 2016-12-19 DIAGNOSIS — E1142 Type 2 diabetes mellitus with diabetic polyneuropathy: Secondary | ICD-10-CM | POA: Insufficient documentation

## 2016-12-19 DIAGNOSIS — I132 Hypertensive heart and chronic kidney disease with heart failure and with stage 5 chronic kidney disease, or end stage renal disease: Secondary | ICD-10-CM | POA: Diagnosis not present

## 2016-12-19 DIAGNOSIS — Z7982 Long term (current) use of aspirin: Secondary | ICD-10-CM | POA: Diagnosis not present

## 2016-12-19 DIAGNOSIS — I509 Heart failure, unspecified: Secondary | ICD-10-CM | POA: Insufficient documentation

## 2016-12-19 DIAGNOSIS — Z5321 Procedure and treatment not carried out due to patient leaving prior to being seen by health care provider: Secondary | ICD-10-CM | POA: Diagnosis not present

## 2016-12-19 DIAGNOSIS — I120 Hypertensive chronic kidney disease with stage 5 chronic kidney disease or end stage renal disease: Secondary | ICD-10-CM | POA: Diagnosis not present

## 2016-12-19 DIAGNOSIS — E1152 Type 2 diabetes mellitus with diabetic peripheral angiopathy with gangrene: Secondary | ICD-10-CM | POA: Insufficient documentation

## 2016-12-19 DIAGNOSIS — L97818 Non-pressure chronic ulcer of other part of right lower leg with other specified severity: Secondary | ICD-10-CM | POA: Diagnosis not present

## 2016-12-19 NOTE — ED Notes (Signed)
Pt does not want blood work completed out in triage.

## 2016-12-19 NOTE — ED Notes (Signed)
Pts daughter came to desk and stated they were leaving, encouraged to stay and see MD but states they will go to PCP

## 2016-12-19 NOTE — ED Triage Notes (Signed)
Pt with abd spasms today after leaving the wound center. Pt points to epigastric area when asked where the spasms are located.

## 2016-12-20 DIAGNOSIS — N186 End stage renal disease: Secondary | ICD-10-CM | POA: Diagnosis not present

## 2016-12-20 DIAGNOSIS — D631 Anemia in chronic kidney disease: Secondary | ICD-10-CM | POA: Diagnosis not present

## 2016-12-20 DIAGNOSIS — E119 Type 2 diabetes mellitus without complications: Secondary | ICD-10-CM | POA: Diagnosis not present

## 2016-12-20 DIAGNOSIS — N2581 Secondary hyperparathyroidism of renal origin: Secondary | ICD-10-CM | POA: Diagnosis not present

## 2016-12-20 NOTE — Progress Notes (Addendum)
Deborah Hendricks, Deborah Hendricks ([id number]) Visit Report for 12/19/2016 Chief Complaint Document Details Patient Name: BREEZY, DAIGNAULT Date of Service: 12/19/2016 12:30 PM Medical Record Patient Account Number: [id number] Number: Treating RN: Cornell Barman 07/31/1946 (70 y.o. Other Clinician: Date of Birth/Sex: Female) Treating Layal Javid Primary Care Provider: Wendy Poet Provider/Extender: G Referring Provider: Bonner Puna in Treatment: 2 Information Obtained from: Patient Chief Complaint 12/05/16; patient is here for review of 3 wounds on the right lower extremity. Electronic Signature(s) Signed: 12/19/2016 4:28:51 PM By: Linton Ham MD Entered By: Linton Ham on 12/19/2016 13:18:57 Deborah Hendricks ([id number]) -------------------------------------------------------------------------------- HPI Details Patient Name: Deborah Hendricks Date of Service: 12/19/2016 12:30 PM Medical Record Patient Account Number: [id number] Number: Treating RN: Cornell Barman 06/13/1946 (70 y.o. 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 She also has chronic renal failure on dialysis, diabetic peripheral neuropathy. She recently was revascularized by Dr. Trula Slade vascular surgery. This includes extensive procedures including an atherectomy of the right superficial femoral and popliteal artery, drug-coated balloon angioplasty of the right superficial femoral and popliteal  artery, balloon angioplasty of the right tibioperoneal trunk and balloon angioplasty of the right peroneal artery. This was done for 2 areas in her right foot. She also has an open area on the right patella that is been there for several weeks. The patient thinks she may have hit this she isn't sure. She is really totally insensate she does not complain of any pain. She has a dialysis catheter in the right femoral artery. Apparently the veins in her arm are too small to perform a shunt 12/29/16; this is a type II diabetic on dialysis. She also has PAD status post revascularization by Dr. Trula Slade. She was here last week with a small open wound on her remaining right great toe tip, lateral aspect of the right first metatarsal head and a small eschar over the right knee. I didn't debridement any of this largely secondary to recent revascularization. She arrives today with a blister on the tip of her first toe. The area on the right knee I think is healing. The area on the lateral first metatarsal head is about the same Electronic Signature(s) Signed: 12/19/2016 4:28:51 PM By: Linton Ham MD Entered By: Linton Ham on 12/19/2016 13:22:54 Deborah Hendricks ([id number]) -------------------------------------------------------------------------------- Physical Exam Details Patient Name: Deborah Hendricks Date of Service: 12/19/2016 12:30 PM Medical Record Patient Account Number: [id number] Number: Treating RN: Cornell Barman Sep 17, 1946 (70 y.o. Other Clinician: Date of Birth/Sex: Female) Treating Nelson Noone Primary Care Provider: Wendy Poet Provider/Extender: G Referring Provider: Bonner Puna in Treatment: 2 Constitutional Patient is hypertensive.. Pulse regular and within target range for patient.[name] Respirations regular, non-labored and within target range.. Patient's appearance is neat and clean. Appears in no acute distress. Well nourished and well developed.. Notes Wound  exam; the patient had a small open wound on her tip of her first toe 2 weeks ago. She now has a blister. In conversation she is probably pushing herself a long in her wheelchair even though she is wearing a sock and a healing sandal. She has a black  eschar over the lateral aspect of the right first metatarsal head and eschar over her right knee. I think the latter is on its way to healing. Electronic Signature(s) Signed: 12/19/2016 4:28:51 PM By: Linton Ham MD Entered By: Linton Ham on 12/19/2016 13:25:48 Deborah Hendricks ([id number]) -------------------------------------------------------------------------------- Physician Orders Details Patient Name: Deborah Hendricks Date of Service: 12/19/2016 12:30 PM Medical Record Patient Account Number: [id number] Number: Treating RN: Cornell Barman [birth year] (70 y.o. Other Clinician: Date of Birth/Sex: Female) Treating Deola Rewis Primary Care Provider: Wendy Poet Provider/Extender: G Referring Provider: Bonner Puna in Treatment: 2 Verbal / Phone Orders: No Diagnosis Coding Wound Cleansing Wound #1 Right,Plantar Toe Great o Clean wound with Normal Saline. o Cleanse wound with mild soap and water Wound #2 Right,Lateral Metatarsal head first o Clean wound with Normal Saline. o Cleanse wound with mild soap and water Wound #3 Right,Anterior Knee o Clean wound with Normal Saline. o Cleanse wound with mild soap and water Primary Wound Dressing Wound #1 Right,Plantar Toe Great o Other: - felt Wound #2 Right,Lateral Metatarsal head first o Foam Wound #3 Right,Anterior Knee o Foam Secondary Dressing Wound #1 Right,Plantar Toe Great o Conform/Kerlix Dressing Change Frequency Wound #1 Right,Plantar Toe Great o Three times weekly Wound #2 Right,Lateral Metatarsal head first o Three times weekly Wound #3 Right,Anterior Knee Deborah Hendricks ([id number]) o Three times weekly Follow-up  Appointments Wound #1 Right,Plantar Toe Great o Return Appointment in 1 week. Wound #2 Right,Lateral Metatarsal head first o Return Appointment in 1 week. Wound #3 Right,Anterior Knee o Return Appointment in 1 week. Edema Control Wound #1 Right,Plantar Toe Great o Elevate legs to the level of the heart and pump ankles as often as possible Wound #2 Right,Lateral Metatarsal head first o Elevate legs to the level of the heart and pump ankles as often as possible Wound #3 Right,Anterior Knee o Elevate legs to the level of the heart and pump ankles as often as possible Off-Loading Wound #1 Right,Plantar Toe Great o Other: - float and elevate heel when lying in the bed Wound #2 Right,Lateral Metatarsal head first o Other: - float and elevate heel when lying in the bed Home Health Wound #1 Right,Plantar Toe Great o D/C [facility] Wound #2 Right,Lateral Metatarsal head first o D/C [facility] Wound #3 Right,Anterior Knee o D/C [place] [facility] o DME provider, dressing supplies - [place]: Elevated foot rest for right leg on wheelchair. Electronic Signature(s) Signed: 12/19/2016 4:28:51 PM By: Linton Ham MD Signed: 12/19/2016 5:04:45 PM By: Gretta Cool RN, BSN, Kim RN, BSN [place], [place] ([id number]) Entered By: Gretta Cool, RN, BSN, Kim on 12/19/2016 13:36:23 Deborah Hendricks, Deborah Hendricks ([id number]) -------------------------------------------------------------------------------- Problem List Details Patient Name: Deborah Hendricks Date of Service: 12/19/2016 12:30 PM Medical Record Patient Account Number: [id number] Number: Treating RN: Cornell Barman 09/24/1946 (70 y.o. Other Clinician: Date of Birth/Sex: Female) Treating Candelario Steppe Primary Care Provider: Wendy Poet Provider/Extender: G Referring Provider: Bonner Puna in Treatment: 2 Active Problems ICD-10 Encounter Code Description Active  Date Diagnosis E11.52 Type 2 diabetes mellitus with diabetic peripheral 12/05/2016 Yes angiopathy with gangrene E11.42 Type 2 diabetes mellitus with diabetic polyneuropathy 12/05/2016 Yes L97.511 Non-pressure chronic ulcer of other part of right foot 12/05/2016 Yes limited to breakdown of skin L97.818 Non-pressure chronic ulcer of other part of right lower leg 12/05/2016 Yes with other specified severity Inactive Problems Resolved Problems Electronic Signature(s) Signed: 12/19/2016 4:28:51 PM By: Linton Ham MD Entered By: Linton Ham on 12/19/2016  13:18:39 Deborah Hendricks, Deborah Hendricks ([id number]) -------------------------------------------------------------------------------- Progress Note Details Patient Name: Deborah Hendricks, Deborah Hendricks Date of Service: 12/19/2016 12:30 PM Medical Record Patient Account Number: [id number] Number: Treating RN: Cornell Barman February 26, 1946 (70 y.o. Other Clinician: Date of Birth/Sex: Female) Treating Jenesis Suchy Primary Care Provider: Wendy Poet Provider/Extender: G Referring Provider: Bonner Puna in Treatment: 2 Subjective Chief Complaint Information obtained from Patient 12/05/16; patient is here for review of 3 wounds on the right lower extremity. 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 She also has chronic renal failure on dialysis, diabetic peripheral neuropathy. She recently was revascularized by Dr. Trula Slade vascular surgery. This includes extensive procedures including an atherectomy of the right superficial femoral and popliteal artery, drug-coated balloon angioplasty of the right superficial femoral and  popliteal artery, balloon angioplasty of the right tibioperoneal trunk and balloon angioplasty of the right peroneal artery. This was done for 2 areas in her right foot. She also has an open area on the right patella that is been there for several weeks. The patient thinks she may have hit this she isn't sure. She is really totally insensate she does not complain of any pain. She has a dialysis catheter in the right femoral artery. Apparently the veins in her arm are too small to perform a shunt 12/29/16; this is a type II diabetic on dialysis. She also has PAD status post revascularization by Dr. Trula Slade. She was here last week with a small open wound on her remaining right great toe tip, lateral aspect of the right first metatarsal head and a small eschar over the right knee. I didn't debridement any of this largely secondary to recent revascularization. She arrives today with a blister on the tip of her first toe. The area on the right knee I think is healing. The area on the lateral first metatarsal head is about the same Objective Constitutional Patient is hypertensive.. Pulse regular and within target range for patient.[name] Respirations regular, non-labored and within target range.. Patient's appearance is neat and clean. Appears in no acute distress. Well nourished and well developed.[name] Deborah Hendricks, Deborah Hendricks ([id number]) Vitals Time Taken: 12:51 PM, Weight: 150 lbs, Temperature: 98.6 F, Pulse: 78 bpm, Respiratory Rate: 16 breaths/min, Blood Pressure: 192/91 mmHg. General Notes: Wound exam; the patient had a small open wound on her tip of her first toe 2 weeks ago. She now has a blister. In conversation she is probably pushing herself a long in her wheelchair even though she is wearing a sock and a healing sandal. She has a black eschar over the lateral aspect of the right first metatarsal head and eschar over her right knee. I think the latter is on its way to healing. Integumentary (Hair,  Skin) Wound #1 status is Open. Original cause of wound was Blister. The wound is located on the [employer]. The wound measures 0cm length x 0cm width x 0cm depth; 0cm^2 area and 0cm^3 volume. Wound #2 status is Open. Original cause of wound was Gradually Appeared. The wound is located on the Right,Lateral Metatarsal head first. The wound measures 0.7cm length x 0.5cm width x 0.1cm depth; 0.275cm^2 area and 0.027cm^3 volume. Wound #3 status is Open. Original cause of wound was Gradually Appeared. The wound is located on  the Right,Anterior Knee. The wound measures 1.5cm length x 1.4cm width x 0.1cm depth; 1.649cm^2 area and 0.165cm^3 volume. Assessment Active Problems ICD-10 E11.52 - Type 2 diabetes mellitus with diabetic peripheral angiopathy with gangrene E11.42 - Type 2 diabetes mellitus with diabetic polyneuropathy L97.511 - Non-pressure chronic ulcer of other part of right foot limited to breakdown of skin L97.818 - Non-pressure chronic ulcer of other part of right lower leg with other specified severity Plan Wound Cleansing: Wound #1 Right,Plantar Toe Great: Clean wound with Normal Saline. Cleanse wound with mild soap and water Wound #2 Right,Lateral Metatarsal head first: Clean wound with Normal Saline. Cleanse wound with mild soap and water Wound #3 Right,Anterior Knee: Deborah Hendricks, Deborah Hendricks ([id number]) Clean wound with Normal Saline. Cleanse wound with mild soap and water Primary Wound Dressing: Wound #1 Right,Plantar Toe Great: Other: - felt Wound #2 Right,Lateral Metatarsal head first: Foam Wound #3 Right,Anterior Knee: Foam Secondary Dressing: Wound #1 Right,Plantar Toe Great: Conform/Kerlix Dressing Change Frequency: Wound #1 Right,Plantar Toe Great: Three times weekly Wound #2 Right,Lateral Metatarsal head first: Three times weekly Wound #3 Right,Anterior Knee: Three times weekly Follow-up Appointments: Wound #1 Right,Plantar Toe Great: Return  Appointment in 1 week. Wound #2 Right,Lateral Metatarsal head first: Return Appointment in 1 week. Wound #3 Right,Anterior Knee: Return Appointment in 1 week. Edema Control: Wound #1 Right,Plantar Toe Great: Elevate legs to the level of the heart and pump ankles as often as possible Wound #2 Right,Lateral Metatarsal head first: Elevate legs to the level of the heart and pump ankles as often as possible Wound #3 Right,Anterior Knee: Elevate legs to the level of the heart and pump ankles as often as possible Off-Loading: Wound #1 Right,Plantar Toe Great: Other: - float and elevate heel when lying in the bed Wound #2 Right,Lateral Metatarsal head first: Other: - float and elevate heel when lying in the bed Home Health: Wound #1 Right,Plantar Toe Great: D/C Home Health Services Wound #2 Right,Lateral Metatarsal head first: D/[facility] Wound #3 Right,Anterior Knee: [facility] Services and Therapies ordered were: DME provider, dressing supplies - [place]: Elevated foot rest for right leg on wheelchair. Deborah Hendricks, Deborah Hendricks ([id number]) border foam to all areas Idid not deturb the blister on the tip of the great toe. , will need to see how this holds up next week Electronic Signature(s) Signed: 12/24/2016 4:30:54 PM By: Gretta Cool RN, BSN, Kim RN, BSN Signed: 12/26/2016 7:47:55 AM By: Linton Ham MD Previous Signature: 12/19/2016 4:28:51 PM Version By: Linton Ham MD Entered By: Gretta Cool RN, BSN, Kim on 12/24/2016 16:30:53 Deborah Hendricks ([id number]) -------------------------------------------------------------------------------- SuperBill Details Patient Name: Deborah Hendricks Date of Service: 12/19/2016 Medical Record Patient Account Number: [id number] Number: Treating RN: Cornell Barman 18-Jun-1946 (70 y.o. Other Clinician: Date of Birth/Sex: Female) Treating Lakyla Biswas, [place] Primary Care Provider: Wendy Poet Provider/Extender:  G Referring Provider: Wendy Poet Service Line: Outpatient Weeks in Treatment: 2 Diagnosis Coding ICD-10 Codes Code Description E11.52 Type 2 diabetes mellitus with diabetic peripheral angiopathy with gangrene E11.42 Type 2 diabetes mellitus with diabetic polyneuropathy L97.511 Non-pressure chronic ulcer of other part of right foot limited to breakdown of skin L97.818 Non-pressure chronic ulcer of other part of right lower leg with other specified severity Facility Procedures CPT4 Code: [id number] Description: [id number] - WOUND CARE VISIT-LEV 3 EST PT Modifier: Quantity: 1 Physician Procedures CPT4: Description Modifier Quantity Code [id number] - WC PHYS LEVEL 2 - EST PT 1 ICD-10 Description Diagnosis E11.52 Type 2 diabetes mellitus with diabetic peripheral  angiopathy with gangrene L97.511 Non-pressure chronic ulcer of other part of right  foot limited to breakdown of skin Electronic Signature(s) Signed: 12/19/2016 4:28:51 PM By: Linton Ham MD Signed: 12/19/2016 5:04:45 PM By: Gretta Cool RN, BSN, Kim RN, BSN Entered By: Gretta Cool, RN, BSN, Kim on 12/19/2016 14:40:47

## 2016-12-20 NOTE — Progress Notes (Addendum)
Deborah Hendricks, Deborah Hendricks ([id number]) Visit Report for 12/19/2016 Arrival Information Details Patient Name: Deborah Hendricks, Deborah Hendricks Date of Service: 12/19/2016 12:30 PM Medical Record Number: [id number] Patient Account Number: [id number] Date of Birth/Sex: [birth year] (71 y.o. Female) Treating RN: Cornell Barman Primary Care Aanya Haynes: Wendy Poet Other Clinician: Referring Maryam Feely: Wendy Poet Treating Cherisa Brucker/Extender: Tito Dine in Treatment: 2 Visit Information History Since Last Visit Added or deleted any medications: No Patient Arrived: Wheel Chair Any new allergies or adverse reactions: No Arrival Time: 12:49 Had a fall or experienced change in No activities of daily living that may affect Accompanied By: family risk of falls: Transfer Assistance: None Signs or symptoms of abuse/neglect since last No Patient Identification Verified: Yes visito Secondary Verification Process Yes Hospitalized since last visit: No Completed: Has Dressing in Place as Prescribed: Yes Patient Requires Transmission-Based No Pain Present Now: No Precautions: Patient Has Alerts: Yes Patient Alerts: DM II ELIQUIS Electronic Signature(s) Signed: 12/19/2016 5:04:45 PM By: Gretta Cool, RN, BSN, Kim RN, BSN Entered By: Gretta Cool, RN, BSN, Kim on 12/19/2016 12:49:52 Deborah Hendricks ([id number]) -------------------------------------------------------------------------------- Clinic Level of Care Assessment Details Patient Name: Deborah Hendricks Date of Service: 12/19/2016 12:30 PM Medical Record Number: [id number] Patient Account Number: [id number] Date of Birth/Sex: 1945-11-15 (71 y.o. Female) Treating RN: Cornell Barman Primary Care Leta Bucklin: Wendy Poet Other Clinician: Referring Lakara Weiland: Wendy Poet Treating Delila Kuklinski/Extender: Tito Dine in Treatment: 2 Clinic Level of Care Assessment Items TOOL 4 Quantity Score []  - Use when only an EandM is performed on FOLLOW-UP visit 0 ASSESSMENTS - Nursing Assessment  / Reassessment []  - Reassessment of Co-morbidities (includes updates in patient status) 0 X - Reassessment of Adherence to Treatment Plan 1 5 ASSESSMENTS - Wound and Skin Assessment / Reassessment []  - Simple Wound Assessment / Reassessment - one wound 0 X - Complex Wound Assessment / Reassessment - multiple wounds 1 5 []  - Dermatologic / Skin Assessment (not related to wound area) 0 ASSESSMENTS - Focused Assessment []  - Circumferential Edema Measurements - multi extremities 0 []  - Nutritional Assessment / Counseling / Intervention 0 []  - Lower Extremity Assessment (monofilament, tuning fork, pulses) 0 []  - Peripheral Arterial Disease Assessment (using hand held doppler) 0 ASSESSMENTS - Ostomy and/or Continence Assessment and Care []  - Incontinence Assessment and Management 0 []  - Ostomy Care Assessment and Management (repouching, etc.) 0 PROCESS - Coordination of Care X - Simple Patient / Family Education for ongoing care 1 15 []  - Complex (extensive) Patient / Family Education for ongoing care 0 X - Staff obtains Programmer, systems, Records, Test Results / Process Orders 1 10 []  - Staff telephones HHA, Nursing Homes / Clarify orders / etc 0 []  - Routine Transfer to another Facility (non-emergent condition) 0 Deborah Hendricks, Deborah Hendricks ([id number]) []  - Routine Hospital Admission (non-emergent condition) 0 []  - New Admissions / Biomedical engineer / Ordering NPWT, Apligraf, etc. 0 []  - Emergency Hospital Admission (emergent condition) 0 X - Simple Discharge Coordination 1 10 []  - Complex (extensive) Discharge Coordination 0 PROCESS - Special Needs []  - Pediatric / Minor Patient Management 0 []  - Isolation Patient Management 0 []  - Hearing / Language / Visual special needs 0 []  - Assessment of Community assistance (transportation, D/C planning, etc.) 0 []  - Additional assistance / Altered mentation 0 []  - Support Surface(s) Assessment (bed, cushion, seat, etc.) 0 INTERVENTIONS - Wound Cleansing /  Measurement []  - Simple Wound Cleansing - one wound 0 X - Complex Wound Cleansing - multiple wounds 3 5 []  - Wound Imaging (photographs -  any number of wounds) 0 X - Wound Tracing (instead of photographs) 1 5 []  - Simple Wound Measurement - one wound 0 X - Complex Wound Measurement - multiple wounds 3 5 INTERVENTIONS - Wound Dressings X - Small Wound Dressing one or multiple wounds 3 10 []  - Medium Wound Dressing one or multiple wounds 0 []  - Large Wound Dressing one or multiple wounds 0 []  - Application of Medications - topical 0 []  - Application of Medications - injection 0 INTERVENTIONS - Miscellaneous []  - External ear exam 0 Deborah Hendricks, Deborah Hendricks ([id number]) []  - Specimen Collection (cultures, biopsies, blood, body fluids, etc.) 0 []  - Specimen(s) / Culture(s) sent or taken to Lab for analysis 0 []  - Patient Transfer (multiple staff / Harrel Lemon Lift / Similar devices) 0 []  - Simple Staple / Suture removal (25 or less) 0 []  - Complex Staple / Suture removal (26 or more) 0 []  - Hypo / Hyperglycemic Management (close monitor of Blood Glucose) 0 []  - Ankle / Brachial Index (ABI) - do not check if billed separately 0 X - Vital Signs 1 5 Has the patient been seen at the hospital within the last three years: Yes Total Score: 115 Level Of Care: New/Established - Level 3 Electronic Signature(s) Signed: 12/19/2016 5:04:45 PM By: Gretta Cool, RN, BSN, Kim RN, BSN Entered By: Gretta Cool, RN, BSN, Kim on 12/19/2016 14:40:22 Deborah Hendricks ([id number]) -------------------------------------------------------------------------------- Encounter Discharge Information Details Patient Name: Deborah Hendricks Date of Service: 12/19/2016 12:30 PM Medical Record Number: [id number] Patient Account Number: [id number] Date of Birth/Sex: Feb 06, 1946 (71 y.o. Female) Treating RN: Cornell Barman Primary Care Becker Christopher: Wendy Poet Other Clinician: Referring Ashlea Dusing: Wendy Poet Treating Saiquan Hands/Extender: Tito Dine in  Treatment: 2 Encounter Discharge Information Items Discharge Pain Level: 0 Discharge Condition: Stable Ambulatory Status: Wheelchair Discharge Destination: Home Transportation: Private Auto daughter and Accompanied By: caregiver Schedule Follow-up Appointment: Yes Medication Reconciliation completed and provided to Patient/Care Yes Deborah Hendricks: Provided on Clinical Summary of Care: 12/19/2016 Form Type Recipient Paper Patient [facility] Electronic Signature(s) Signed: 12/19/2016 5:04:45 PM By: Gretta Cool RN, BSN, Kim RN, BSN Previous Signature: 12/19/2016 1:36:22 PM Version By: Ruthine Dose Entered By: Gretta Cool RN, BSN, Kim on 12/19/2016 13:39:00 Deborah Hendricks ([id number]) -------------------------------------------------------------------------------- Lower Extremity Assessment Details Patient Name: Deborah Hendricks Date of Service: 12/19/2016 12:30 PM Medical Record Number: [id number] Patient Account Number: [id number] Date of Birth/Sex: 1945/12/29 (71 y.o. Female) Treating RN: Cornell Barman Primary Care Jaylissa Felty: Wendy Poet Other Clinician: Referring Iban Utz: Wendy Poet Treating Page Pucciarelli/Extender: Tito Dine in Treatment: 2 Vascular Assessment Claudication: Claudication Assessment [Right:None] Pulses: Dorsalis Pedis Palpable: [Right:No] Doppler Audible: [Right:Yes] Posterior Tibial Extremity colors, hair growth, and conditions: Extremity Color: [Right:Dusky] Hair Growth on Extremity: [Right:No] Temperature of Extremity: [Right:Warm] Capillary Refill: [Right:> 3 seconds] Dependent Rubor: [Right:No] Blanched when Elevated: [Right:No] Lipodermatosclerosis: [Right:No] Notes Patient only has great toe on right foot. Electronic Signature(s) Signed: 12/19/2016 5:04:45 PM By: Gretta Cool RN, BSN, Kim RN, BSN Entered By: Gretta Cool, RN, BSN, Kim on 12/19/2016 13:03:26 Deborah Hendricks ([id number]) -------------------------------------------------------------------------------- Multi Wound  Chart Details Patient Name: Deborah Hendricks Date of Service: 12/19/2016 12:30 PM Medical Record Number: [id number] Patient Account Number: [id number] Date of Birth/Sex: [birth year] (71 y.o. Female) Treating RN: Cornell Barman Primary Care Shany Marinez: Wendy Poet Other Clinician: Referring Elanora Quin: Wendy Poet Treating Rudi Knippenberg/Extender: Tito Dine in Treatment: 2 Vital Signs Height(in): Pulse(bpm): 78 Weight(lbs): 150 Blood Pressure 192/91 (mmHg): Body Mass Index(BMI): Temperature(F): 98.6 Respiratory Rate 16 (breaths/min): Photos: [1:No Photos] [2:No Photos] [3:No Photos] Wound Location: [1:Right, Plantar Toe  Great] [2:Right, Lateral Metatarsal head first] [3:Right, Anterior Knee] Wounding Event: [1:Blister] [2:Gradually Appeared] [3:Gradually Appeared] Primary Etiology: [1:Diabetic Wound/Ulcer of the Lower Extremity] [2:Diabetic Wound/Ulcer of the Lower Extremity] [3:Diabetic Wound/Ulcer of the Lower Extremity] Date Acquired: [1:10/05/2016] [2:10/05/2016] [3:11/04/2016] Weeks of Treatment: [1:2] [2:2] [3:2] Wound Status: [1:Open] [2:Open] [3:Open] Pending Amputation on Yes [2:No] [3:No] Presentation: Measurements L x W x D 0x0x0 [2:0.7x0.5x0.1] [3:1.5x1.4x0.1] (cm) Area (cm) : [1:0] [2:0.275] [3:1.649] Volume (cm) : [1:0] [2:0.027] [3:0.165] % Reduction in Area: [1:100.00%] [2:84.40%] [3:47.50%] % Reduction in Volume: 100.00% [2:84.70%] [3:47.50%] Classification: [1:Grade 2] [2:Grade 2] [3:Grade 1] Periwound Skin Texture: No Abnormalities Noted [2:No Abnormalities Noted] [3:No Abnormalities Noted] Periwound Skin [1:No Abnormalities Noted] [2:No Abnormalities Noted] [3:No Abnormalities Noted] Moisture: Periwound Skin Color: No Abnormalities Noted [2:No Abnormalities Noted] [3:No Abnormalities Noted] Tenderness on [1:No] [2:No] [3:No] Treatment Notes Electronic Signature(s) Signed: 12/19/2016 4:28:51 PM By: Linton Ham MD Deborah Hendricks, Deborah Hendricks ([id number]) Entered  By: Linton Ham on 12/19/2016 13:18:47 Deborah Hendricks ([id number]) -------------------------------------------------------------------------------- Multi-Disciplinary Care Plan Details Patient Name: Deborah Hendricks Date of Service: 12/19/2016 12:30 PM Medical Record Number: [id number] Patient Account Number: [id number] Date of Birth/Sex: 10/03/1946 (71 y.o. Female) Treating RN: Cornell Barman Primary Care Jeydi Klingel: Wendy Poet Other Clinician: Referring Shadeed Colberg: Wendy Poet Treating Aster Screws/Extender: Tito Dine in Treatment: 2 Active Inactive ` Abuse / Safety / Falls / Self Care Management Nursing Diagnoses: Potential for falls Goals: Patient will remain injury free Date Initiated: 12/05/2016 Target Resolution Date: 03/09/2017 Goal Status: Active Patient/caregiver will verbalize/demonstrate measure taken to improve self care Date Initiated: 12/05/2016 Target Resolution Date: 03/09/2017 Goal Status: Active Interventions: Assess fall risk on admission and as needed Assess self care needs on admission and as needed Notes: ` Nutrition Nursing Diagnoses: Imbalanced nutrition Impaired glucose control: actual or potential Potential for alteratiion in Nutrition/Potential for imbalanced nutrition Goals: Patient/caregiver will maintain therapeutic glucose control Date Initiated: 12/05/2016 Target Resolution Date: 03/09/2017 Goal Status: Active Interventions: Assess patient nutrition upon admission and as needed per policy Provide education on elevated blood sugars and impact on wound healing Deborah Hendricks, Deborah Hendricks ([id number]) Notes: ` Orientation to the Wound Care Program Nursing Diagnoses: Knowledge deficit related to the wound healing center program Goals: Patient/caregiver will verbalize understanding of the [place] Program Date Initiated: 12/05/2016 Target Resolution Date: 12/22/2016 Goal Status: Active Interventions: Provide education on orientation to  the wound center Notes: ` Wound/Skin Impairment Nursing Diagnoses: Impaired tissue integrity Knowledge deficit related to smoking impact on wound healing Knowledge deficit related to ulceration/compromised skin integrity Goals: Ulcer/skin breakdown will have a volume reduction of 80% by week 12 Date Initiated: 12/05/2016 Target Resolution Date: 03/09/2017 Goal Status: Active Interventions: Assess patient/caregiver ability to perform ulcer/skin care regimen upon admission and as needed Assess ulceration(s) every visit Notes: Electronic Signature(s) Signed: 12/19/2016 5:04:45 PM By: Gretta Cool, RN, BSN, Kim RN, BSN Entered By: Gretta Cool, RN, BSN, Kim on 12/19/2016 13:13:50 Deborah Hendricks ([id number]) -------------------------------------------------------------------------------- Non-Wound Condition Assessment Details Patient Name: Deborah Hendricks Date of Service: 12/19/2016 12:30 PM Medical Record Number: [id number] Patient Account Number: [id number] Date of Birth/Sex: 04/08/1946 (71 y.o. Female) Treating RN: Cornell Barman Primary Care Jiovanny Burdell: Wendy Poet Other Clinician: Referring Jeanni Allshouse: Wendy Poet Treating Waniya Hoglund/Extender: Ricard Dillon Weeks in Treatment: 2 Non-Wound Condition: Condition: Suspected Deep Tissue Injury Location: Other: Right great toe Side: Right Photos Periwound Skin Texture Texture Color No Abnormalities Noted: No No Abnormalities Noted: No Callus: No Atrophie Blanche: No Crepitus: No Cyanosis: No Excoriation: No Ecchymosis: Yes Friable: No Erythema: No Induration: No Hemosiderin  Staining: No Rash: No Mottled: No Scarring: No Pallor: No Rubor: No Moisture No Abnormalities Noted: No Dry / Scaly: No Maceration: No Electronic Signature(s) Signed: 12/19/2016 5:04:45 PM By: Gretta Cool, RN, BSN, Kim RN, BSN Entered By: Gretta Cool, RN, BSN, Kim on 12/19/2016 13:13:09 Deborah Hendricks  ([id number]) -------------------------------------------------------------------------------- Pain Assessment Details Patient Name: Deborah Hendricks Date of Service: 12/19/2016 12:30 PM Medical Record Number: [id number] Patient Account Number: [id number] Date of Birth/Sex: 1946-07-18 (71 y.o. Female) Treating RN: Cornell Barman Primary Care Kenia Teagarden: Wendy Poet Other Clinician: Referring Daielle Melcher: Wendy Poet Treating Samar Dass/Extender: Tito Dine in Treatment: 2 Active Problems Location of Pain Severity and Description of Pain Patient Has Paino No Site Locations With Dressing Change: No Pain Management and Medication Current Pain Management: Electronic Signature(s) Signed: 12/19/2016 5:04:45 PM By: Gretta Cool, RN, BSN, Kim RN, BSN Entered By: Gretta Cool, RN, BSN, Kim on 12/19/2016 12:50:02 Deborah Hendricks ([id number]) -------------------------------------------------------------------------------- Patient/Caregiver Education Details Patient Name: Deborah Hendricks Date of Service: 12/19/2016 12:30 PM Medical Record Patient Account Number: [id number] Number: Treating RN: Cornell Barman 03/20/1946 (70 y.o. Other Clinician: Date of Birth/Gender: Female) Treating ROBSON, [place] Primary Care Physician: Wendy Poet Physician/Extender: G Referring Physician: Bonner Puna in Treatment: 2 Education Assessment Education Provided To: Patient Education Topics Provided Wound/Skin Impairment: Handouts: Caring for Your Ulcer Methods: Demonstration Responses: State content correctly Electronic Signature(s) Signed: 12/19/2016 5:04:45 PM By: Gretta Cool, RN, BSN, Kim RN, BSN Entered By: Gretta Cool, RN, BSN, Kim on 12/19/2016 13:39:12 Deborah Hendricks ([id number]) -------------------------------------------------------------------------------- Wound Assessment Details Patient Name: Deborah Hendricks Date of Service: 12/19/2016 12:30 PM Medical Record Number: [id number] Patient Account Number:  [id number] Date of Birth/Sex: 1946-01-06 (71 y.o. Female) Treating RN: Cornell Barman Primary Care Gladys Gutman: Wendy Poet Other Clinician: Referring Emoree Sasaki: Wendy Poet Treating Shabrea Weldin/Extender: Ricard Dillon Weeks in Treatment: 2 Wound Status Wound Number: 1 Primary Diabetic Wound/Ulcer of the Lower Etiology: Extremity Wound Location: Right, Plantar Toe Great Wound Status: Open Wounding Event: Blister Date Acquired: 10/05/2016 Weeks Of Treatment: 2 Clustered Wound: No Pending Amputation On Presentation Photos Photo Uploaded By: Gretta Cool, RN, BSN, Kim on 12/19/2016 16:03:18 Wound Measurements Length: (cm) 0.1 Width: (cm) 0.1 Depth: (cm) 0.1 Area: (cm) 0.008 Volume: (cm) 0.001 % Reduction in Area: 0% % Reduction in Volume: 0% Wound Description Classification: Grade 2 Periwound Skin Texture Texture Color No Abnormalities Noted: No No Abnormalities Noted: No Moisture No Abnormalities Noted: No Treatment Notes Wound #1 (Right, Plantar Toe Great) 1. Cleansed with: Clean wound with Normal Saline Deborah Hendricks, Deborah Hendricks ([id number]) 4. Dressing Applied: Foam 5. Secondary Dressing Applied Bordered Foam Dressing Notes conform and felt Electronic Signature(s) Signed: 01/10/2017 4:56:48 PM By: Gretta Cool, RN, BSN, Kim RN, BSN Previous Signature: 12/19/2016 5:04:45 PM Version By: Gretta Cool RN, BSN, Kim RN, BSN Entered By: Gretta Cool, RN, BSN, Kim on 01/09/2017 13:56:26 Deborah Hendricks ([id number]) -------------------------------------------------------------------------------- Wound Assessment Details Patient Name: Deborah Hendricks Date of Service: 12/19/2016 12:30 PM Medical Record Number: [id number] Patient Account Number: [id number] Date of Birth/Sex: 1945/12/24 (71 y.o. Female) Treating RN: Cornell Barman Primary Care Kaeden Depaz: Wendy Poet Other Clinician: Referring Judy Goodenow: Wendy Poet Treating Andrik Sandt/Extender: Ricard Dillon Weeks in Treatment: 2 Wound Status Wound Number: 2 Primary  Diabetic Wound/Ulcer of the Lower Etiology: Extremity Wound Location: Right, Lateral Metatarsal head first Wound Status: Open Wounding Event: Gradually Appeared Date Acquired: 10/05/2016 Weeks Of Treatment: 2 Clustered Wound: No Photos Photo Uploaded By: Gretta Cool, RN, BSN, Kim on 12/19/2016 16:03:18 Wound Measurements Length: (cm) 0.7 Width: (cm) 0.5 Depth: (cm) 0.1 Area: (cm) 0.275 Volume: (cm) 0.027 %  Reduction in Area: 84.4% % Reduction in Volume: 84.7% Wound Description Classification: Grade 2 Periwound Skin Texture Texture Color No Abnormalities Noted: No No Abnormalities Noted: No Moisture No Abnormalities Noted: No Electronic Signature(s) Signed: 12/19/2016 5:04:45 PM By: Gretta Cool, RN, BSN, Kim RN, BSN Entered By: Gretta Cool, RN, BSN, Kim on 12/19/2016 13:00:14 Deborah Hendricks, Deborah Hendricks ([id number]) Deborah Hendricks, Deborah Hendricks ([id number]) -------------------------------------------------------------------------------- Wound Assessment Details Patient Name: Deborah Hendricks Date of Service: 12/19/2016 12:30 PM Medical Record Number: [id number] Patient Account Number: [id number] Date of Birth/Sex: 1946/04/06 (71 y.o. Female) Treating RN: Cornell Barman Primary Care Janie Strothman: Wendy Poet Other Clinician: Referring Sashia Campas: Wendy Poet Treating Dametrius Sanjuan/Extender: Tito Dine in Treatment: 2 Wound Status Wound Number: 3 Primary Diabetic Wound/Ulcer of the Lower Etiology: Extremity Wound Location: Right, Anterior Knee Wound Status: Open Wounding Event: Gradually Appeared Date Acquired: 11/04/2016 Weeks Of Treatment: 2 Clustered Wound: No Photos Photo Uploaded By: Gretta Cool, RN, BSN, Kim on 12/19/2016 16:03:31 Wound Measurements Length: (cm) 1.5 Width: (cm) 1.4 Depth: (cm) 0.1 Area: (cm) 1.649 Volume: (cm) 0.165 % Reduction in Area: 47.5% % Reduction in Volume: 47.5% Wound Description Classification: Grade 1 Periwound Skin Texture Texture Color No Abnormalities Noted: No No  Abnormalities Noted: No Moisture No Abnormalities Noted: No Electronic Signature(s) Signed: 12/19/2016 5:04:45 PM By: Gretta Cool, RN, BSN, Kim RN, BSN Entered By: Gretta Cool, RN, BSN, Kim on 12/19/2016 13:00:14 Deborah Hendricks ([id number]) -------------------------------------------------------------------------------- Vitals Details Patient Name: Deborah Hendricks Date of Service: 12/19/2016 12:30 PM Medical Record Number: [id number] Patient Account Number: [id number] Date of Birth/Sex: [birth year] (71 y.o. Female) Treating RN: Cornell Barman Primary Care Reyanne Hussar: Wendy Poet Other Clinician: Referring Tank Difiore: Wendy Poet Treating Lorry Anastasi/Extender: Tito Dine in Treatment: 2 Vital Signs Time Taken: 12:51 Temperature (F): 98.6 Weight (lbs): 150 Pulse (bpm): 78 Respiratory Rate (breaths/min): 16 Blood Pressure (mmHg): 192/91 Reference Range: 80 - 120 mg / dl Electronic Signature(s) Signed: 12/19/2016 5:04:45 PM By: Gretta Cool, RN, BSN, Kim RN, BSN Entered By: Gretta Cool, RN, BSN, Kim on 12/19/2016 12:53:16

## 2016-12-21 ENCOUNTER — Ambulatory Visit: Payer: Medicare Other | Admitting: Podiatry

## 2016-12-22 DIAGNOSIS — D631 Anemia in chronic kidney disease: Secondary | ICD-10-CM | POA: Diagnosis not present

## 2016-12-22 DIAGNOSIS — N186 End stage renal disease: Secondary | ICD-10-CM | POA: Diagnosis not present

## 2016-12-22 DIAGNOSIS — N2581 Secondary hyperparathyroidism of renal origin: Secondary | ICD-10-CM | POA: Diagnosis not present

## 2016-12-22 DIAGNOSIS — E119 Type 2 diabetes mellitus without complications: Secondary | ICD-10-CM | POA: Diagnosis not present

## 2016-12-24 ENCOUNTER — Encounter: Payer: Medicare Other | Admitting: Physical Therapy

## 2016-12-25 DIAGNOSIS — D631 Anemia in chronic kidney disease: Secondary | ICD-10-CM | POA: Diagnosis not present

## 2016-12-25 DIAGNOSIS — N2581 Secondary hyperparathyroidism of renal origin: Secondary | ICD-10-CM | POA: Diagnosis not present

## 2016-12-25 DIAGNOSIS — E119 Type 2 diabetes mellitus without complications: Secondary | ICD-10-CM | POA: Diagnosis not present

## 2016-12-25 DIAGNOSIS — N186 End stage renal disease: Secondary | ICD-10-CM | POA: Diagnosis not present

## 2016-12-26 ENCOUNTER — Encounter: Payer: Medicare Other | Admitting: Internal Medicine

## 2016-12-26 ENCOUNTER — Encounter: Payer: Medicare Other | Admitting: Physical Therapy

## 2016-12-26 DIAGNOSIS — L97512 Non-pressure chronic ulcer of other part of right foot with fat layer exposed: Secondary | ICD-10-CM | POA: Diagnosis not present

## 2016-12-26 DIAGNOSIS — N186 End stage renal disease: Secondary | ICD-10-CM | POA: Diagnosis not present

## 2016-12-26 DIAGNOSIS — E11621 Type 2 diabetes mellitus with foot ulcer: Secondary | ICD-10-CM | POA: Diagnosis not present

## 2016-12-26 DIAGNOSIS — L97511 Non-pressure chronic ulcer of other part of right foot limited to breakdown of skin: Secondary | ICD-10-CM | POA: Diagnosis not present

## 2016-12-26 DIAGNOSIS — E1152 Type 2 diabetes mellitus with diabetic peripheral angiopathy with gangrene: Secondary | ICD-10-CM | POA: Diagnosis not present

## 2016-12-26 DIAGNOSIS — E1142 Type 2 diabetes mellitus with diabetic polyneuropathy: Secondary | ICD-10-CM | POA: Diagnosis not present

## 2016-12-26 DIAGNOSIS — I120 Hypertensive chronic kidney disease with stage 5 chronic kidney disease or end stage renal disease: Secondary | ICD-10-CM | POA: Diagnosis not present

## 2016-12-26 DIAGNOSIS — L97818 Non-pressure chronic ulcer of other part of right lower leg with other specified severity: Secondary | ICD-10-CM | POA: Diagnosis not present

## 2016-12-27 DIAGNOSIS — E119 Type 2 diabetes mellitus without complications: Secondary | ICD-10-CM | POA: Diagnosis not present

## 2016-12-27 DIAGNOSIS — D631 Anemia in chronic kidney disease: Secondary | ICD-10-CM | POA: Diagnosis not present

## 2016-12-27 DIAGNOSIS — N2581 Secondary hyperparathyroidism of renal origin: Secondary | ICD-10-CM | POA: Diagnosis not present

## 2016-12-27 DIAGNOSIS — N186 End stage renal disease: Secondary | ICD-10-CM | POA: Diagnosis not present

## 2016-12-29 DIAGNOSIS — D631 Anemia in chronic kidney disease: Secondary | ICD-10-CM | POA: Diagnosis not present

## 2016-12-29 DIAGNOSIS — E119 Type 2 diabetes mellitus without complications: Secondary | ICD-10-CM | POA: Diagnosis not present

## 2016-12-29 DIAGNOSIS — N186 End stage renal disease: Secondary | ICD-10-CM | POA: Diagnosis not present

## 2016-12-29 DIAGNOSIS — N2581 Secondary hyperparathyroidism of renal origin: Secondary | ICD-10-CM | POA: Diagnosis not present

## 2016-12-29 NOTE — Progress Notes (Signed)
QUIANNA, Hendricks ([id number]) Visit Report for 12/26/2016 Chief Complaint Document Details Patient Name: Deborah Hendricks, Deborah Hendricks Date of Service: 12/26/2016 8:45 AM Medical Record Patient Account Number: [id number] Number: Treating RN: Ahmed Prima 15-Nov-1945 (71 y.o. Other Clinician: Date of Birth/Sex: Female) Treating ROBSON, MICHAEL Primary Care Provider: Wendy Poet Provider/Extender: G Referring Provider: Bonner Puna in Treatment: 3 Information Obtained from: Patient Chief Complaint 12/05/16; patient is here for review of 3 wounds on the right lower extremity. Electronic Signature(s) Signed: 12/26/2016 5:24:26 PM By: Linton Ham MD Entered By: Linton Ham on 12/26/2016 09:45:27 Deborah Hendricks ([id number]) -------------------------------------------------------------------------------- Debridement Details Patient Name: Deborah Hendricks Date of Service: 12/26/2016 8:45 AM Medical Record Patient Account Number: [id number] Number: Treating RN: Ahmed Prima 28-Jan-1946 (71 y.o. Other Clinician: Date of Birth/Sex: Female) Treating ROBSON, MICHAEL Primary Care Provider: Wendy Poet Provider/Extender: G Referring Provider: Bonner Puna in Treatment: 3 Debridement Performed for Wound #1 Right,Plantar Toe Great Assessment: Performed By: Physician Ricard Dillon, MD Debridement: Debridement Pre-procedure Yes - 09:30 Verification/Time Out Taken: Start Time: 09:31 Pain Control: Lidocaine 4% Topical Solution Level: Skin/Subcutaneous Tissue Total Area Debrided (L x 0.1 (cm) x 0.1 (cm) = 0.01 (cm) W): Tissue and other Viable, Non-Viable, Exudate, Fibrin/Slough, Subcutaneous material debrided: Instrument: Curette Bleeding: Minimum Hemostasis Achieved: Pressure End Time: 09:34 Procedural Pain: 0 Post Procedural Pain: 0 Response to Treatment: Procedure was tolerated well Post Debridement Measurements of Total Wound Length: (cm) 0.6 Width:  (cm) 0.6 Depth: (cm) 0.1 Volume: (cm) 0.028 Character of Wound/Ulcer Post Requires Further Debridement Debridement: Severity of Tissue Post Debridement: Fat layer exposed Post Procedure Diagnosis Same as Pre-procedure Electronic Signature(s) Signed: 12/26/2016 5:24:26 PM By: Linton Ham MD Signed: 12/28/2016 4:03:50 PM By: Pasty Arch, Mariann Laster ([id number]) Entered By: Linton Ham on 12/26/2016 09:45:15 Deborah Hendricks ([id number]) -------------------------------------------------------------------------------- HPI Details Patient Name: Deborah Hendricks Date of Service: 12/26/2016 8:45 AM Medical Record Patient Account Number: [id number] Number: Treating RN: Ahmed Prima Dec 19, 1945 (71 y.o. 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 She also has chronic renal failure on dialysis, diabetic peripheral neuropathy. She recently was revascularized by Dr. Trula Slade vascular surgery. This includes extensive procedures including an atherectomy of the right superficial femoral and popliteal artery, drug-coated balloon angioplasty of the right superficial femoral and popliteal artery, balloon angioplasty of the right tibioperoneal trunk and balloon angioplasty of the right peroneal artery. This was done for 2 areas in her right foot. She also has an open area on the right patella that is been there for several weeks. The patient thinks she may  have hit this she isn't sure. She is really totally insensate she does not complain of any pain. She has a dialysis catheter in the right femoral artery. Apparently the veins in her arm are too small to perform a shunt 12/19/16; this is a type II diabetic on dialysis. She also has PAD status post revascularization by Dr. Trula Slade. She was here last week with a small open wound on her remaining right great toe tip, lateral aspect of the right first metatarsal head and a small eschar over the right  knee. I didn't debridement any of this largely secondary to recent revascularization. She arrives today with a blister on the tip of her first toe. The area on the right knee I think is healing. The area on the lateral first metatarsal head is about the same 12/26/16; this is a type II diabetic with a history of significant PAD also chronic renal failure on dialysis. He has an area on the right great toe tip which is her last remaining toe on the right foot. She is also had a very proximal left above-knee amputation. The surface eschar on the toe wound has come off. On arrival she had thick hypertrophic tissue which did not look like a healed state to me. She also had some eschar on her right knee we wiped off and she has a healed state there. Electronic Signature(s) Signed: 12/26/2016 5:24:26 PM By: Linton Ham MD Entered By: Linton Ham on 12/26/2016 09:47:08 Deborah Hendricks ([id number]) -------------------------------------------------------------------------------- Physical Exam Details Patient Name: Deborah Hendricks Date of Service: 12/26/2016 8:45 AM Medical Record Patient Account Number: [id number] Number: Treating RN: Ahmed Prima 1946-08-27 (71 y.o. Other Clinician: Date of Birth/Sex: Female) Treating ROBSON, MICHAEL Primary Care Provider: Wendy Poet Provider/Extender: G Referring Provider: Bonner Puna in Treatment: 3 Constitutional Sitting or standing Blood  Pressure is within target range for patient.. Pulse regular and within target range for patient.[name] Respirations regular, non-labored and within target range.. Temperature is normal and within the target range for the patient.. Patient's appearance is neat and clean. Appears in no acute distress. Well nourished and well developed.. Cardiovascular Pedal pulses absent bilaterally.. Notes Wound exam; removed today moderate amount of nonviable tissue with a #5 curet. There is no blister like she had last week. There is a small reasonably healthy remaining wound on the tip of the great toe plantar aspect. This does not look to be infected and looks as though it hasn't up blood foot supplied to support healing. Electronic Signature(s) Signed: 12/26/2016 5:24:26 PM By: Linton Ham MD Entered By: Linton Ham on 12/26/2016 09:48:37 Deborah Hendricks ([id number]) -------------------------------------------------------------------------------- Physician Orders Details Patient Name: Deborah Hendricks Date of Service: 12/26/2016 8:45 AM Medical Record Patient Account Number: [id number] Number: Treating RN: Ahmed Prima September 25, 1946 (70 y.o. Other Clinician: Date of Birth/Sex: Female) Treating ROBSON, MICHAEL Primary Care Provider: Wendy Poet Provider/Extender: G Referring Provider: Bonner Puna in Treatment: 3 Verbal / Phone Orders: Yes ClinicianCarolyne Fiscal, Debi Read Back and Verified: Yes Diagnosis Coding Wound Cleansing Wound #1 Right,Plantar Toe Great o Clean wound with Normal Saline. o Cleanse wound with mild soap and water Primary Wound Dressing Wound #1 Right,Plantar Toe Great o Aquacel Ag Secondary Dressing Wound #1 Right,Plantar Toe Great o Conform/Kerlix o Foam Dressing Change Frequency Wound #1 Right,Plantar Toe Great o Change dressing every other day. Follow-up Appointments Wound #1 Right,Plantar Toe Great o Return Appointment in 1  week. Edema Control Wound #1 Right,Plantar Toe Great o Elevate legs to the level of the heart and pump ankles as often as possible Off-Loading Wound #1 Right,Plantar Toe Great o Other: - float and elevate heel when lying in the bed Electronic Signature(s) Signed: 12/26/2016 5:24:26 PM By: Linton Ham MD [place], [place] ([id number]) Signed: 12/28/2016 4:03:50 PM By: Alric Quan Entered By: Alric Quan on 12/26/2016 09:42:51 Deborah Hendricks ([id number]) -------------------------------------------------------------------------------- Problem List Details Patient Name: Deborah Hendricks Date of Service: 12/26/2016 8:45 AM Medical Record Patient Account Number: [id number] Number: Treating RN: Ahmed Prima Jan 15, 1946 (70 y.o. Other Clinician: Date of Birth/Sex: Female)  Treating Linton Ham Primary Care Provider: Wendy Poet Provider/Extender: G Referring Provider: Bonner Puna in Treatment: 3 Active Problems ICD-10 Encounter Code Description Active Date Diagnosis E11.52 Type 2 diabetes mellitus with diabetic peripheral 12/05/2016 Yes angiopathy with gangrene E11.42 Type 2 diabetes mellitus with diabetic polyneuropathy 12/05/2016 Yes L97.511 Non-pressure chronic ulcer of other part of right foot 12/05/2016 Yes limited to breakdown of skin L97.818 Non-pressure chronic ulcer of other part of right lower leg 12/05/2016 Yes with other specified severity Inactive Problems Resolved Problems Electronic Signature(s) Signed: 12/26/2016 5:24:26 PM By: Linton Ham MD Entered By: Linton Ham on 12/26/2016 09:44:17 Deborah Hendricks ([id number]) -------------------------------------------------------------------------------- Progress Note Details Patient Name: Deborah Hendricks Date of Service: 12/26/2016 8:45 AM Medical Record Patient Account Number: [id number] Number: Treating RN: Ahmed Prima [birth year] (70 y.o. Other Clinician: Date of  Birth/Sex: Female) Treating ROBSON, MICHAEL Primary Care Provider: Wendy Poet Provider/Extender: G Referring Provider: Bonner Puna in Treatment: 3 Subjective Chief Complaint Information obtained from Patient 12/05/16; patient is here for review of 3 wounds on the right lower extremity. 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 She also has chronic renal failure on dialysis, diabetic peripheral neuropathy. She recently was revascularized by Dr. Trula Slade vascular surgery. This includes extensive procedures including an atherectomy of the right superficial femoral and popliteal artery, drug-coated balloon angioplasty of the right superficial femoral and popliteal artery, balloon angioplasty of the right tibioperoneal trunk and balloon angioplasty of the right peroneal artery. This was done for 2 areas in her right foot. She also has an open area on the right patella that is been there for several weeks. The patient thinks she may have hit this she isn't sure. She is really totally insensate she does not complain of any pain. She has a dialysis catheter in the right femoral artery. Apparently the veins in her arm are too small to perform a shunt 12/19/16; this is a type II diabetic on dialysis. She also has PAD status post revascularization by Dr. Trula Slade. She was here last week with a small open wound on her remaining right great toe tip, lateral aspect of the right first metatarsal head and a small eschar over the right knee. I didn't debridement any of this largely secondary to recent revascularization. She arrives today with a blister on the tip of her  first toe. The area on the right knee I think is healing. The area on the lateral first metatarsal head is about the same 12/26/16; this is a type II diabetic with a history of significant PAD also chronic renal failure on dialysis. He has an area on the right great toe tip which is her last remaining toe on the right foot. She is also had a very proximal left above-knee amputation. The surface eschar on the toe wound has come off. On arrival she had thick hypertrophic tissue which did not look like a healed state to me. She also had some eschar on her right knee we wiped off and she has a healed state there. Objective Peel, Mariann Laster ([id number]) Constitutional Sitting or standing Blood Pressure is within target range for patient.. Pulse regular and within target range for patient.[name] Respirations regular, non-labored and within target range.. Temperature is normal and within  the target range for the patient.. Patient's appearance is neat and clean. Appears in no acute distress. Well nourished and well developed.. Vitals Time Taken: 9:01 AM, Weight: 150 lbs, Temperature: 98.3 F, Pulse: 76 bpm, Respiratory Rate: 16 breaths/min, Blood Pressure: 102/69 mmHg. Cardiovascular Pedal pulses absent bilaterally.. General Notes: Wound exam; removed today moderate amount of nonviable tissue with a #5 curet. There is no blister like she had last week. There is a small reasonably healthy remaining wound on the tip of the great toe plantar aspect. This does not look to be infected and looks as though it hasn't up blood foot supplied to support healing. Integumentary (Hair, Skin) Wound #1 status is Open. Original cause of wound was Blister. The wound is located on the [employer]. The wound measures 0.1cm length x 0.1cm width x 0.1cm depth; 0.008cm^2 area and 0.001cm^3 volume. There is no tunneling or undermining noted. There is no granulation within the wound bed. There is a large (67-100%)  amount of necrotic tissue within the wound bed including Eschar. Wound #2 status is Healed - Epithelialized. Original cause of wound was Gradually Appeared. The wound is located on the Right,Lateral Metatarsal head first. The wound measures 0cm length x 0cm width x 0cm depth; 0cm^2 area and 0cm^3 volume. The wound is limited to skin breakdown. There is no tunneling or undermining noted. There is a none present amount of drainage noted. The wound margin is distinct with the outline attached to the wound base. There is no granulation within the wound bed. There is no necrotic tissue within the wound bed. Wound #3 status is Healed - Epithelialized. Original cause of wound was Gradually Appeared. The wound is located on the Right,Anterior Knee. The wound measures 0cm length x 0cm width x 0cm depth; 0cm^2 area and 0cm^3 volume. There is no tunneling or undermining noted. There is a none present amount of drainage noted. The wound margin is distinct with the outline attached to the wound base. There is no granulation within the wound bed. There is no necrotic tissue within the wound bed. Assessment Active Problems ICD-10 E11.52 - Type 2 diabetes mellitus with diabetic peripheral angiopathy with gangrene E11.42 - Type 2 diabetes mellitus with diabetic polyneuropathy L97.511 - Non-pressure chronic ulcer of other part of right foot limited to breakdown of skin [place], Mariann Laster ([id number]) L97.818 - Non-pressure chronic ulcer of other part of right lower leg with other specified severity Procedures Wound #1 Wound #1 is a Diabetic Wound/Ulcer of the Lower Extremity located on the Right,Plantar Toe Great . There was a Skin/Subcutaneous Tissue Debridement [id number]) debridement with total area of 0.01 sq cm performed by Ricard Dillon, MD. with the following instrument(s): Curette to remove Viable and Non-Viable tissue/material including Exudate, Fibrin/Slough, and Subcutaneous after achieving pain  control using Lidocaine 4% Topical Solution. A time out was conducted at 09:30, prior to the start of the procedure. A Minimum amount of bleeding was controlled with Pressure. The procedure was tolerated well with a pain level of 0 throughout and a pain level of 0 following the procedure. Post Debridement Measurements: 0.6cm length x 0.6cm width x 0.1cm depth; 0.028cm^3 volume. Character of Wound/Ulcer Post Debridement requires further debridement. Severity of Tissue Post Debridement is: Fat layer exposed. Post procedure Diagnosis Wound #1: Same as Pre-Procedure Plan Wound Cleansing: Wound #1 Right,Plantar Toe Great: Clean wound with Normal Saline. Cleanse wound with mild soap and water Primary Wound Dressing: Wound #1 Right,Plantar Toe Great: Aquacel Ag Secondary Dressing: Wound #1  Right,Plantar Toe Great: Conform/Kerlix Foam Dressing Change Frequency: Wound #1 Right,Plantar Toe Great: Change dressing every other day. Follow-up Appointments: Wound #1 Right,Plantar Toe Great: Return Appointment in 1 week. Edema Control: Wound #1 Right,Plantar Toe Great: Elevate legs to the level of the heart and pump ankles as often as possible Off-Loading: Delay, Mariann Laster ([id number]) Wound #1 Right,Plantar Toe Great: Other: - float and elevate heel when lying in the bed aquacel ag kerlix.conform Electronic Signature(s) Signed: 12/26/2016 5:24:26 PM By: Linton Ham MD Entered By: Linton Ham on 12/26/2016 [place], [place] ([id number]) -------------------------------------------------------------------------------- SuperBill Details Patient Name: Deborah Hendricks Date of Service: 12/26/2016 Medical Record Patient Account Number: [id number] Number: Treating RN: Ahmed Prima 12-Sep-1946 (70 y.o. Other Clinician: Date of Birth/Sex: Female) Treating ROBSON, [place] Primary Care Provider: Wendy Poet Provider/Extender: G Referring Provider: Wendy Poet Service Line:  Outpatient Weeks in Treatment: 3 Diagnosis Coding ICD-10 Codes Code Description E11.52 Type 2 diabetes mellitus with diabetic peripheral angiopathy with gangrene E11.42 Type 2 diabetes mellitus with diabetic polyneuropathy L97.511 Non-pressure chronic ulcer of other part of right foot limited to breakdown of skin L97.818 Non-pressure chronic ulcer of other part of right lower leg with other specified severity Facility Procedures CPT4: Description Modifier Quantity Code [id number] - DEB SUBQ TISSUE 20 SQ CM/< 1 ICD-10 Description Diagnosis L97.511 Non-pressure chronic ulcer of other part of right foot limited to breakdown of skin Physician Procedures CPT4: Description Modifier Quantity Code [id number] - WC PHYS SUBQ TISS 20 SQ CM 1 ICD-10 Description Diagnosis L97.511 Non-pressure chronic ulcer of other part of right foot limited to breakdown of skin Electronic Signature(s) Signed: 12/26/2016 5:24:26 PM By: Linton Ham MD Entered By: Linton Ham on 12/26/2016 09:51:11

## 2016-12-29 NOTE — Progress Notes (Signed)
Deborah Hendricks, Deborah Hendricks ([id number]) Visit Report for 12/26/2016 Arrival Information Details Patient Name: Deborah Hendricks, Deborah Hendricks Date of Service: 12/26/2016 8:45 AM Medical Record Number: [id number] Patient Account Number: [id number] Date of Birth/Sex: 1946-09-12 (71 y.o. Female) Treating RN: Carolyne Fiscal, Debi Primary Care Griffith Santilli: Wendy Poet Other Clinician: Referring Janashia Parco: Wendy Poet Treating Araeya Lamb/Extender: Tito Dine in Treatment: 3 Visit Information History Since Last Visit All ordered tests and consults were completed: No Patient Arrived: Wheel Chair Added or deleted any medications: No Arrival Time: 09:00 Any new allergies or adverse reactions: No Accompanied By: daughter Had a fall or experienced change in No Transfer Assistance: [employer] activities of daily living that may affect Patient Lift risk of falls: Patient Identification Verified: Yes Signs or symptoms of abuse/neglect since last No Secondary Verification Process Yes visito Completed: Hospitalized since last visit: No Patient Requires Transmission- No Has Dressing in Place as Prescribed: Yes Based Precautions: Has Compression in Place as Prescribed: Yes Patient Has Alerts: Yes Pain Present Now: No Patient Alerts: DM II ELIQUIS Electronic Signature(s) Signed: 12/28/2016 4:03:50 PM By: Alric Quan Entered By: Alric Quan on 12/26/2016 09:01:39 Deborah Hendricks ([id number]) -------------------------------------------------------------------------------- Encounter Discharge Information Details Patient Name: Deborah Hendricks Date of Service: 12/26/2016 8:45 AM Medical Record Number: [id number] Patient Account Number: [id number] Date of Birth/Sex: Feb 23, 1946 (71 y.o. Female) Treating RN: Carolyne Fiscal, Debi Primary Care Murvin Gift: Wendy Poet Other Clinician: Referring Amiri Riechers: Wendy Poet Treating Rishard Delange/Extender: Tito Dine in Treatment: 3 Encounter Discharge Information  Items Schedule Follow-up Appointment: No Medication Reconciliation completed No and provided to Patient/Care Raj Landress: Provided on Clinical Summary of Care: 12/26/2016 Form Type Recipient Paper Patient [facility] Electronic Signature(s) Signed: 12/26/2016 9:44:32 AM By: Ruthine Dose Entered By: Ruthine Dose on 12/26/2016 09:44:31 Deborah Hendricks ([id number]) -------------------------------------------------------------------------------- Lower Extremity Assessment Details Patient Name: Deborah Hendricks Date of Service: 12/26/2016 8:45 AM Medical Record Number: [id number] Patient Account Number: [id number] Date of Birth/Sex: [birth year] (70 y.o. Female) Treating RN: Carolyne Fiscal, Debi Primary Care Ario Mcdiarmid: Wendy Poet Other Clinician: Referring Anivea Velasques: Wendy Poet Treating Masiya Claassen/Extender: Tito Dine in Treatment: 3 Vascular Assessment Pulses: Dorsalis Pedis Palpable: [Right:No] Doppler Audible: [Right:Yes] Posterior Tibial Extremity colors, hair growth, and conditions: Extremity Color: [Right:Hyperpigmented] Temperature of Extremity: [Right:Warm] Capillary Refill: [Right:> 3 seconds] Electronic Signature(s) Signed: 12/28/2016 4:03:50 PM By: Alric Quan Entered By: Alric Quan on 12/26/2016 09:34:40 Deborah Hendricks ([id number]) -------------------------------------------------------------------------------- Multi Wound Chart Details Patient Name: Deborah Hendricks Date of Service: 12/26/2016 8:45 AM Medical Record Number: [id number] Patient Account Number: [id number] Date of Birth/Sex: February 03, 1946 (71 y.o. Female) Treating RN: Carolyne Fiscal, Debi Primary Care Lydell Moga: Wendy Poet Other Clinician: Referring Delvon Chipps: Wendy Poet Treating Preesha Benjamin/Extender: Ricard Dillon Weeks in Treatment: 3 Vital Signs Height(in): Pulse(bpm): 76 Weight(lbs): 150 Blood Pressure 102/69 (mmHg): Body Mass Index(BMI): Temperature(F): 98.3 Respiratory  Rate 16 (breaths/min): Photos: [1:No Photos] [2:No Photos] [3:No Photos] Wound Location: [1:Right, Plantar Toe GreatRight Metatarsal head first Right Knee - Anterior] [2:- Lateral] Wounding Event: [1:Blister] [2:Gradually Appeared] [3:Gradually Appeared] Primary Etiology: [1:Diabetic Wound/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] [2:the Lower Extremity] [3:the Lower Extremity] Comorbid History: [1:Anemia, Arrhythmia, Congestive Heart Failure, Congestive Heart Failure, Congestive Heart Failure, Hypertension, Peripheral Hypertension, Peripheral Hypertension, Peripheral Venous Disease, Type II Venous Disease, Type II Venous  Disease, Type II Diabetes, End Stage Renal Disease, Osteoarthritis, Neuropathy, Seizure Disorder] [2:Anemia, Arrhythmia, Diabetes, End Stage Renal Disease, Osteoarthritis, Neuropathy, Seizure Disorder] [3:Anemia, Arrhythmia, Diabetes, End Stage Renal  Disease, Osteoarthritis, Neuropathy, Seizure Disorder] Date Acquired: [1:10/05/2016] [2:10/05/2016] [3:11/04/2016] Weeks of Treatment: [1:3] [2:3] [3:3]  Wound Status: [1:Open] [2:Healed - Epithelialized] [3:Healed - Epithelialized] Pending Amputation on Yes [2:No] [3:No] Presentation: Measurements L x W x D 0.1x0.1x0.1 [2:0x0x0] [3:0x0x0] (cm) Area (cm) : [1:0.008] [2:0] [3:0] Volume (cm) : [1:0.001] [2:0] [3:0] % Reduction in Area: [1:0.00%] [2:100.00%] [3:100.00%] % Reduction in Volume: 0.00% [2:100.00%] [3:100.00%] Classification: [1:Grade 2] [2:Grade 2] [3:Grade 1] Exudate Amount: [1:N/A] [2:None Present] [3:None Present] Wound Margin: [1:N/A] [2:Distinct, outline attached Distinct, outline attached] Granulation Amount: [1:None Present (0%)] [2:None Present (0%)] [3:None Present (0%)] Necrotic Amount: Large (67-100%) None Present (0%) None Present (0%) Necrotic Tissue: Eschar N/A N/A Epithelialization: N/A Large (67-100%) Large (67-100%) Debridement: Debridement [id number] N/A  N/A [id number]) Pre-procedure 09:30 N/A N/A Verification/Time Out Taken: Pain Control: Lidocaine 4% Topical N/A N/A Solution Tissue Debrided: Fibrin/Slough, Exudates, N/A N/A Subcutaneous Level: Skin/Subcutaneous N/A N/A Tissue Debridement Area (sq 0.01 N/A N/A cm): Instrument: Curette N/A N/A Bleeding: Minimum N/A N/A Hemostasis Achieved: Pressure N/A N/A Procedural Pain: 0 N/A N/A Post Procedural Pain: 0 N/A N/A Debridement Treatment Procedure was tolerated N/A N/A Response: well Post Debridement 0.6x0.6x0.1 N/A N/A Measurements L x W x D (cm) Post Debridement 0.028 N/A N/A Volume: (cm) Periwound Skin Texture: No Abnormalities Noted No Abnormalities Noted No Abnormalities Noted Periwound Skin No Abnormalities Noted No Abnormalities Noted No Abnormalities Noted Moisture: Periwound Skin Color: No Abnormalities Noted No Abnormalities Noted No Abnormalities Noted Tenderness on No No No Palpation: Wound Preparation: Ulcer Cleansing: Ulcer Cleansing: Ulcer Cleansing: Rinsed/Irrigated with Rinsed/Irrigated with Rinsed/Irrigated with Saline Saline Saline Topical Anesthetic Topical Anesthetic Topical Anesthetic Applied: None Applied: None Applied: None Procedures Performed: Debridement N/A N/A Treatment Notes Wound #1 (Right, Plantar Toe Great) 1. Cleansed with: Clean wound with Normal Saline 2. Anesthetic Topical Lidocaine 4% cream to wound bed prior to debridement 4. Dressing Applied: Deborah Hendricks, Deborah Hendricks ([id number]) Aquacel Ag 5. Secondary Dressing Applied Foam Gauze and Kerlix/Conform 7. Secured with Recruitment consultant) Signed: 12/26/2016 5:24:26 PM By: Linton Ham MD Entered By: Linton Ham on 12/26/2016 09:44:57 Deborah Hendricks ([id number]) -------------------------------------------------------------------------------- Multi-Disciplinary Care Plan Details Patient Name: Deborah Hendricks Date of Service: 12/26/2016 8:45 AM Medical Record Number:  [id number] Patient Account Number: [id number] Date of Birth/Sex: 07/05/1946 (70 y.o. Female) Treating RN: Carolyne Fiscal, Debi Primary Care Pebble Botkin: Wendy Poet Other Clinician: Referring Velna Hedgecock: Wendy Poet Treating Sophee Mckimmy/Extender: Tito Dine in Treatment: 3 Active Inactive ` Abuse / Safety / Falls / Self Care Management Nursing Diagnoses: Potential for falls Goals: Patient will remain injury free Date Initiated: 12/05/2016 Target Resolution Date: 03/09/2017 Goal Status: Active Patient/caregiver will verbalize/demonstrate measure taken to improve self care Date Initiated: 12/05/2016 Target Resolution Date: 03/09/2017 Goal Status: Active Interventions: Assess fall risk on admission and as needed Assess self care needs on admission and as needed Notes: ` Nutrition Nursing Diagnoses: Imbalanced nutrition Impaired glucose control: actual or potential Potential for alteratiion in Nutrition/Potential for imbalanced nutrition Goals: Patient/caregiver will maintain therapeutic glucose control Date Initiated: 12/05/2016 Target Resolution Date: 03/09/2017 Goal Status: Active Interventions: Assess patient nutrition upon admission and as needed per policy Provide education on elevated blood sugars and impact on wound healing Deborah Hendricks, Deborah Hendricks ([id number]) Notes: ` Orientation to the Wound Care Program Nursing Diagnoses: Knowledge deficit related to the wound healing center program Goals: Patient/caregiver will verbalize understanding of the [facility] Date Initiated: 12/05/2016 Target Resolution Date: 12/22/2016 Goal Status: Active Interventions: Provide education on orientation to the wound center Notes: ` Wound/Skin Impairment Nursing Diagnoses: Impaired tissue integrity Knowledge deficit related to smoking impact on wound healing Knowledge  deficit related to ulceration/compromised skin integrity Goals: Ulcer/skin breakdown will have a volume  reduction of 80% by week 12 Date Initiated: 12/05/2016 Target Resolution Date: 03/09/2017 Goal Status: Active Interventions: Assess patient/caregiver ability to perform ulcer/skin care regimen upon admission and as needed Assess ulceration(s) every visit Notes: Electronic Signature(s) Signed: 12/28/2016 4:03:50 PM By: Alric Quan Entered By: Alric Quan on 12/26/2016 09:34:44 Deborah Hendricks ([id number]) -------------------------------------------------------------------------------- Pain Assessment Details Patient Name: Deborah Hendricks Date of Service: 12/26/2016 8:45 AM Medical Record Number: [id number] Patient Account Number: [id number] Date of Birth/Sex: 1945-11-13 (70 y.o. Female) Treating RN: Carolyne Fiscal, Debi Primary Care Ece Cumberland: Wendy Poet Other Clinician: Referring Tanyia Grabbe: Wendy Poet Treating Nickalaus Crooke/Extender: Ricard Dillon Weeks in Treatment: 3 Active Problems Location of Pain Severity and Description of Pain Patient Has Paino No Site Locations With Dressing Change: No Pain Management and Medication Current Pain Management: Electronic Signature(s) Signed: 12/28/2016 4:03:50 PM By: Alric Quan Entered By: Alric Quan on 12/26/2016 09:01:45 Deborah Hendricks ([id number]) -------------------------------------------------------------------------------- Wound Assessment Details Patient Name: Deborah Hendricks Date of Service: 12/26/2016 8:45 AM Medical Record Number: [id number] Patient Account Number: [id number] Date of Birth/Sex: 1946/03/08 (70 y.o. Female) Treating RN: Carolyne Fiscal, Debi Primary Care Kaylor Simenson: Wendy Poet Other Clinician: Referring Donnia Poplaski: Wendy Poet Treating Treniece Holsclaw/Extender: Ricard Dillon Weeks in Treatment: 3 Wound Status Wound Number: 1 Primary Diabetic Wound/Ulcer of the Lower Etiology: Extremity Wound Location: Right, Plantar Toe Great Wound Open Wounding Event: Blister Status: Date Acquired: 10/05/2016 Comorbid  Anemia, Arrhythmia, Congestive Heart Weeks Of Treatment: 3 History: Failure, Hypertension, Peripheral Clustered Wound: No Venous Disease, Type II Diabetes, End Pending Amputation On Presentation Stage Renal Disease, Osteoarthritis, Neuropathy, Seizure Disorder Photos Photo Uploaded By: Alric Quan on 12/27/2016 07:50:33 Wound Measurements Length: (cm) 0.1 Width: (cm) 0.1 Depth: (cm) 0.1 Area: (cm) 0.008 Volume: (cm) 0.001 % Reduction in Area: 0% % Reduction in Volume: 0% Tunneling: No Undermining: No Wound Description Classification: Grade 2 Wound Bed Granulation Amount: None Present (0%) Necrotic Amount: Large (67-100%) Necrotic Quality: Eschar Periwound Skin Texture Texture Color Deborah Hendricks, Deborah Hendricks ([id number]) No Abnormalities Noted: No No Abnormalities Noted: No Moisture No Abnormalities Noted: No Wound Preparation Ulcer Cleansing: Rinsed/Irrigated with Saline Topical Anesthetic Applied: None Treatment Notes Wound #1 (Right, Plantar Toe Great) 1. Cleansed with: Clean wound with Normal Saline 2. Anesthetic Topical Lidocaine 4% cream to wound bed prior to debridement 4. Dressing Applied: Aquacel Ag 5. Secondary Dressing Applied Foam Gauze and Kerlix/Conform 7. Secured with Recruitment consultant) Signed: 12/28/2016 4:03:50 PM By: Alric Quan Entered By: Alric Quan on 12/26/2016 09:34:30 Deborah Hendricks ([id number]) -------------------------------------------------------------------------------- Wound Assessment Details Patient Name: Deborah Hendricks Date of Service: 12/26/2016 8:45 AM Medical Record Number: [id number] Patient Account Number: [id number] Date of Birth/Sex: Jan 20, 1946 (70 y.o. Female) Treating RN: Carolyne Fiscal, Debi Primary Care Flor Whitacre: Wendy Poet Other Clinician: Referring Khira Cudmore: Wendy Poet Treating Lexie Morini/Extender: Ricard Dillon Weeks in Treatment: 3 Wound Status Wound Number: 2 Primary Diabetic Wound/Ulcer  of the Lower Etiology: Extremity Wound Location: Right Metatarsal head first - Lateral Wound Healed - Epithelialized Status: Wounding Event: Gradually Appeared Comorbid Anemia, Arrhythmia, Congestive Heart Date Acquired: 10/05/2016 History: Failure, Hypertension, Peripheral Weeks Of Treatment: 3 Venous Disease, Type II Diabetes, End Clustered Wound: No Stage Renal Disease, Osteoarthritis, Neuropathy, Seizure Disorder Photos Photo Uploaded By: Alric Quan on 12/27/2016 07:50:59 Wound Measurements Length: (cm) 0 % Reductio Width: (cm) 0 % Reductio Depth: (cm) 0 Epithelial Area: (cm) 0 Tunneling Volume: (cm) 0 Undermini n in Area: 100% n in Volume: 100% ization: Large (67-100%) : No ng:  No Wound Description Classification: Grade 2 Wound Margin: Distinct, outline attached Exudate Amount: None Present Foul Odor After Cleansing: No Slough/Fibrino No Wound Bed Granulation Amount: None Present (0%) Exposed Structure Necrotic Amount: None Present (0%) Fascia Exposed: No Fat Layer (Subcutaneous Tissue) Exposed: No Tendon Exposed: No Deborah Hendricks, Deborah Hendricks ([id number]) Muscle Exposed: No Joint Exposed: No Bone Exposed: No Limited to Skin Breakdown Periwound Skin Texture Texture Color No Abnormalities Noted: No No Abnormalities Noted: No Moisture No Abnormalities Noted: No Wound Preparation Ulcer Cleansing: Rinsed/Irrigated with Saline Topical Anesthetic Applied: None Electronic Signature(s) Signed: 12/28/2016 4:03:50 PM By: Alric Quan Entered By: Alric Quan on 12/26/2016 09:10:48 Deborah Hendricks ([id number]) -------------------------------------------------------------------------------- Wound Assessment Details Patient Name: Deborah Hendricks Date of Service: 12/26/2016 8:45 AM Medical Record Number: [id number] Patient Account Number: [id number] Date of Birth/Sex: 12-Sep-1946 (71 y.o. Female) Treating RN: Carolyne Fiscal, Debi Primary Care Vinia Jemmott: Wendy Poet Other Clinician: Referring Bliss Tsang: Wendy Poet Treating Ammaar Encina/Extender: Ricard Dillon Weeks in Treatment: 3 Wound Status Wound Number: 3 Primary Diabetic Wound/Ulcer of the Lower Etiology: Extremity Wound Location: Right Knee - Anterior Wound Healed - Epithelialized Wounding Event: Gradually Appeared Status: Date Acquired: 11/04/2016 Comorbid Anemia, Arrhythmia, Congestive Heart Weeks Of Treatment: 3 History: Failure, Hypertension, Peripheral Clustered Wound: No Venous Disease, Type II Diabetes, End Stage Renal Disease, Osteoarthritis, Neuropathy, Seizure Disorder Photos Photo Uploaded By: Alric Quan on 12/27/2016 07:51:00 Wound Measurements Length: (cm) 0 % Reductio Width: (cm) 0 % Reductio Depth: (cm) 0 Epithelial Area: (cm) 0 Tunneling Volume: (cm) 0 Undermini n in Area: 100% n in Volume: 100% ization: Large (67-100%) : No ng: No Wound Description Classification: Grade 1 Wound Margin: Distinct, outline attached Exudate Amount: None Present Foul Odor After Cleansing: No Slough/Fibrino No Wound Bed Granulation Amount: None Present (0%) Necrotic Amount: None Present (0%) Periwound Skin Texture Deborah Hendricks, Deborah Hendricks ([id number]) Texture Color No Abnormalities Noted: No No Abnormalities Noted: No Moisture No Abnormalities Noted: No Wound Preparation Ulcer Cleansing: Rinsed/Irrigated with Saline Topical Anesthetic Applied: None Electronic Signature(s) Signed: 12/28/2016 4:03:50 PM By: Alric Quan Entered By: Alric Quan on 12/26/2016 09:34:14 Deborah Hendricks ([id number]) -------------------------------------------------------------------------------- Vitals Details Patient Name: Deborah Hendricks Date of Service: 12/26/2016 8:45 AM Medical Record Number: [id number] Patient Account Number: [id number] Date of Birth/Sex: May 18, 1946 (71 y.o. Female) Treating RN: Carolyne Fiscal, Debi Primary Care Creighton Longley: Wendy Poet Other Clinician: Referring  Lubertha Leite: Wendy Poet Treating Atalya Dano/Extender: Tito Dine in Treatment: 3 Vital Signs Time Taken: 09:01 Temperature (F): 98.3 Weight (lbs): 150 Pulse (bpm): 76 Respiratory Rate (breaths/min): 16 Blood Pressure (mmHg): 102/69 Reference Range: 80 - 120 mg / dl Electronic Signature(s) Signed: 12/28/2016 4:03:50 PM By: Alric Quan Entered By: Alric Quan on 12/26/2016 09:05:59

## 2017-01-01 ENCOUNTER — Encounter: Payer: Self-pay | Admitting: Surgery

## 2017-01-01 ENCOUNTER — Encounter (HOSPITAL_COMMUNITY): Payer: Medicare Other

## 2017-01-01 DIAGNOSIS — N2581 Secondary hyperparathyroidism of renal origin: Secondary | ICD-10-CM | POA: Diagnosis not present

## 2017-01-01 DIAGNOSIS — E119 Type 2 diabetes mellitus without complications: Secondary | ICD-10-CM | POA: Diagnosis not present

## 2017-01-01 DIAGNOSIS — N186 End stage renal disease: Secondary | ICD-10-CM | POA: Diagnosis not present

## 2017-01-01 DIAGNOSIS — D631 Anemia in chronic kidney disease: Secondary | ICD-10-CM | POA: Diagnosis not present

## 2017-01-02 ENCOUNTER — Inpatient Hospital Stay (HOSPITAL_COMMUNITY): Admission: RE | Admit: 2017-01-02 | Payer: Medicare Other | Source: Ambulatory Visit

## 2017-01-02 ENCOUNTER — Ambulatory Visit: Payer: Medicare Other | Admitting: Internal Medicine

## 2017-01-03 DIAGNOSIS — N186 End stage renal disease: Secondary | ICD-10-CM | POA: Diagnosis not present

## 2017-01-03 DIAGNOSIS — E119 Type 2 diabetes mellitus without complications: Secondary | ICD-10-CM | POA: Diagnosis not present

## 2017-01-03 DIAGNOSIS — N2581 Secondary hyperparathyroidism of renal origin: Secondary | ICD-10-CM | POA: Diagnosis not present

## 2017-01-03 DIAGNOSIS — D631 Anemia in chronic kidney disease: Secondary | ICD-10-CM | POA: Diagnosis not present

## 2017-01-05 DIAGNOSIS — D631 Anemia in chronic kidney disease: Secondary | ICD-10-CM | POA: Diagnosis not present

## 2017-01-05 DIAGNOSIS — N186 End stage renal disease: Secondary | ICD-10-CM | POA: Diagnosis not present

## 2017-01-05 DIAGNOSIS — N2581 Secondary hyperparathyroidism of renal origin: Secondary | ICD-10-CM | POA: Diagnosis not present

## 2017-01-05 DIAGNOSIS — E119 Type 2 diabetes mellitus without complications: Secondary | ICD-10-CM | POA: Diagnosis not present

## 2017-01-07 ENCOUNTER — Ambulatory Visit: Payer: Medicare Other | Admitting: Surgery

## 2017-01-08 DIAGNOSIS — N186 End stage renal disease: Secondary | ICD-10-CM | POA: Diagnosis not present

## 2017-01-08 DIAGNOSIS — E119 Type 2 diabetes mellitus without complications: Secondary | ICD-10-CM | POA: Diagnosis not present

## 2017-01-08 DIAGNOSIS — N2581 Secondary hyperparathyroidism of renal origin: Secondary | ICD-10-CM | POA: Diagnosis not present

## 2017-01-08 DIAGNOSIS — D631 Anemia in chronic kidney disease: Secondary | ICD-10-CM | POA: Diagnosis not present

## 2017-01-09 ENCOUNTER — Encounter: Payer: Medicare Other | Admitting: Internal Medicine

## 2017-01-09 DIAGNOSIS — Z992 Dependence on renal dialysis: Secondary | ICD-10-CM | POA: Diagnosis not present

## 2017-01-09 DIAGNOSIS — I120 Hypertensive chronic kidney disease with stage 5 chronic kidney disease or end stage renal disease: Secondary | ICD-10-CM | POA: Diagnosis not present

## 2017-01-09 DIAGNOSIS — E11621 Type 2 diabetes mellitus with foot ulcer: Secondary | ICD-10-CM | POA: Diagnosis not present

## 2017-01-09 DIAGNOSIS — L97511 Non-pressure chronic ulcer of other part of right foot limited to breakdown of skin: Secondary | ICD-10-CM | POA: Diagnosis not present

## 2017-01-09 DIAGNOSIS — E1152 Type 2 diabetes mellitus with diabetic peripheral angiopathy with gangrene: Secondary | ICD-10-CM | POA: Diagnosis not present

## 2017-01-09 DIAGNOSIS — L97818 Non-pressure chronic ulcer of other part of right lower leg with other specified severity: Secondary | ICD-10-CM | POA: Diagnosis not present

## 2017-01-09 DIAGNOSIS — E1142 Type 2 diabetes mellitus with diabetic polyneuropathy: Secondary | ICD-10-CM | POA: Diagnosis not present

## 2017-01-09 DIAGNOSIS — L97512 Non-pressure chronic ulcer of other part of right foot with fat layer exposed: Secondary | ICD-10-CM | POA: Diagnosis not present

## 2017-01-09 DIAGNOSIS — N186 End stage renal disease: Secondary | ICD-10-CM | POA: Diagnosis not present

## 2017-01-10 DIAGNOSIS — E119 Type 2 diabetes mellitus without complications: Secondary | ICD-10-CM | POA: Diagnosis not present

## 2017-01-10 DIAGNOSIS — D631 Anemia in chronic kidney disease: Secondary | ICD-10-CM | POA: Diagnosis not present

## 2017-01-10 DIAGNOSIS — N2581 Secondary hyperparathyroidism of renal origin: Secondary | ICD-10-CM | POA: Diagnosis not present

## 2017-01-10 DIAGNOSIS — N186 End stage renal disease: Secondary | ICD-10-CM | POA: Diagnosis not present

## 2017-01-10 NOTE — Progress Notes (Signed)
Deborah Hendricks, Deborah Hendricks ([id number]) Visit Report for 01/09/2017 Chief Complaint Document Details Patient Name: Deborah Hendricks, Deborah Hendricks Date of Service: 01/09/2017 1:30 PM Medical Record Patient Account Number: [id number] Number: Treating RN: Ahmed Prima 08-31-1946 (70 y.o. Other Clinician: Date of Birth/Sex: Female) Treating Deborah Hendricks Primary Care Provider: Wendy Hendricks Provider/Extender: G Referring Provider: Bonner Hendricks in Treatment: 5 Information Obtained from: Patient Chief Complaint 12/05/16; patient is here for review of 3 wounds on the right lower extremity. Electronic Signature(s) Signed: 01/09/2017 3:56:26 PM By: Deborah Hendricks Entered By: Deborah Ham on 01/09/2017 14:14:17 Deborah Hendricks ([id number]) -------------------------------------------------------------------------------- Debridement Details Patient Name: Deborah Hendricks Date of Service: 01/09/2017 1:30 PM Medical Record Patient Account Number: [id number] Number: Treating RN: Ahmed Prima 08/17/1946 (70 y.o. Other Clinician: Date of Birth/Sex: Female) Treating Arvon Schreiner, [place] Primary Care Provider: Wendy Hendricks Provider/Extender: G Referring Provider: Bonner Hendricks in Treatment: 5 Debridement Performed for Wound #1 Right,Plantar Toe Great Assessment: Performed By: Physician Deborah Dillon, Deborah Hendricks Debridement: Debridement Pre-procedure Yes - 13:47 Verification/Time Out Taken: Start Time: 13:48 Pain Control: Lidocaine 4% Topical Solution Level: Skin/Subcutaneous Tissue Total Area Debrided (L x 0.1 (cm) x 0.1 (cm) = 0.01 (cm) W): Tissue and other Viable, Non-Viable, Exudate, Fibrin/Slough, Subcutaneous material debrided: Instrument: Blade, Curette Bleeding: Minimum Hemostasis Achieved: Pressure End Time: 13:50 Procedural Pain: 0 Post Procedural Pain: 0 Response to Treatment: Procedure was tolerated well Post Debridement Measurements of Total Wound Length: (cm)  0.2 Width: (cm) 0.2 Depth: (cm) 0.1 Volume: (cm) 0.003 Character of Wound/Ulcer Post Requires Further Debridement Debridement: Severity of Tissue Post Debridement: Fat layer exposed Post Procedure Diagnosis Same as Pre-procedure Electronic Signature(s) Signed: 01/09/2017 3:56:26 PM By: Deborah Hendricks Signed: 01/09/2017 4:23:48 PM By: Deborah Hendricks, Deborah Hendricks ([id number]) Entered By: Deborah Ham on 01/09/2017 14:13:22 Deborah Hendricks ([id number]) -------------------------------------------------------------------------------- HPI Details Patient Name: Deborah Hendricks Date of Service: 01/09/2017 1:30 PM Medical Record Patient Account Number: [id number] Number: Treating RN: Ahmed Prima [birth year] (70 y.o. 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 She also has chronic renal failure on dialysis, diabetic peripheral neuropathy. She recently was revascularized by Dr. Trula Slade vascular surgery. This includes extensive procedures including an atherectomy of the right superficial femoral and popliteal artery, drug-coated balloon angioplasty of the right superficial femoral and popliteal artery, balloon angioplasty of the right tibioperoneal trunk and balloon angioplasty of the right peroneal artery. This was done for 2 areas in her right foot. She also has an open area on the right patella that is been there for several weeks. The patient  thinks she may have hit this she isn't sure. She is really totally insensate she does not complain of any pain. She has a dialysis catheter in the right femoral artery. Apparently the veins in her arm are too small to perform a shunt 12/19/16; this is a type II diabetic on dialysis. She also has PAD status post revascularization by Dr. Trula Slade. She was here last week with a small open wound on her remaining right great toe tip, lateral aspect of the right first metatarsal head and a small eschar over the  right knee. I didn't debridement any of this largely secondary to recent revascularization. She arrives today with a blister on the tip of her first toe. The area on the right knee I think is healing. The area on the lateral first metatarsal head is about the same 12/26/16; this is a type II diabetic with a history of significant PAD also chronic renal failure on dialysis. He has an area on the right great toe tip which is her last remaining toe on the right foot. She is also had a very proximal left above-knee amputation. The surface eschar on the toe wound has come off. On arrival she had thick hypertrophic tissue which did not look like a healed state to me. She also had some eschar on her right knee we wiped off and she has a healed state there. 01/09/17;arrives with a surface on the tip of the right great toe. using Aquacel Electronic Signature(s) Signed: 01/09/2017 3:56:26 PM By: Deborah Hendricks Entered By: Deborah Ham on 01/09/2017 14:15:52 Deborah Hendricks ([id number]) -------------------------------------------------------------------------------- Physical Exam Details Patient Name: Deborah Hendricks Date of Service: 01/09/2017 1:30 PM Medical Record Patient Account Number: [id number] Number: Treating RN: Ahmed Prima 10-29-1946 (70 y.o. Other Clinician: Date of Birth/Sex: Female) Treating Deborah Hendricks Primary Care Provider: Wendy Hendricks Provider/Extender: G Referring  Provider: Bonner Hendricks in Treatment: 5 Constitutional Sitting or standing Blood Pressure is within target range for patient.. Pulse regular and within target range for patient.[name] Respirations regular, non-labored and within target range.. Temperature is normal and within the target range for the patient.. Patient's appearance is neat and clean. Appears in no acute distress. Well nourished and well developed.. Notes Wound exam; again I removed the relative large amount of necrotic nonviable tissue with a #3 curet she still has an open area under all of this but I don't think is totally closed. The debridement today was a little more aggressive than last week. I'll change the dressing to silver collagen Electronic Signature(s) Signed: 01/09/2017 3:56:26 PM By: Deborah Hendricks Entered By: Deborah Ham on 01/09/2017 14:17:13 Deborah Hendricks ([id number]) -------------------------------------------------------------------------------- Physician Orders Details Patient Name: Deborah Hendricks Date of Service: 01/09/2017 1:30 PM Medical Record Patient Account Number: [id number] Number: Treating RN: Ahmed Prima May 28, 1946 (70 y.o. Other Clinician: Date of Birth/Sex: Female) Treating Deborah Hendricks, Deborah Hendricks Primary Care Provider: Wendy Hendricks Provider/Extender: G Referring Provider: Bonner Hendricks in Treatment: 5 Verbal / Phone Orders: Yes Clinician: Carolyne Hendricks, Deborah Hendricks Read Back and Verified: Yes Diagnosis Coding Wound Cleansing Wound #1 Right,Plantar Toe Great o Clean wound with Normal Saline. o Cleanse wound with mild soap and water Primary Wound Dressing Wound #1 Right,Plantar Toe Great o Prisma Ag - moisten with saline Secondary Dressing Wound #1 Right,Plantar Toe Great o Conform/Kerlix o Foam Dressing Change Frequency Wound #1 Right,Plantar Toe Great o Change dressing every other day. Follow-up Appointments Wound #1 Right,Plantar Toe Great o Return  Appointment in 1 week. Edema Control Wound #1 Right,Plantar Toe Great o Elevate legs to the level of the heart and pump ankles as often as possible Off-Loading Wound #1 Right,Plantar Toe Great o Other: - float and elevate heel when lying in the bed Electronic Signature(s) Signed: 01/09/2017 3:56:26 PM By: Deborah Hendricks [place], [place] ([id number]) Signed: 01/09/2017 4:23:48 PM By: Alric Quan Entered By: Alric Quan on 01/09/2017 13:51:30 Deborah Hendricks ([id number]) -------------------------------------------------------------------------------- Problem List Details Patient Name: Deborah Hendricks Date of Service: 01/09/2017 1:30 PM Medical Record Patient Account Number: [id number] Number: Treating RN: Deborah Hendricks, Deborah Hendricks 08-29-1946 (70  y.o. Other Clinician: Date of Birth/Sex: Female) Treating Kazimir Hartnett Primary Care Provider: Wendy Hendricks Provider/Extender: G Referring Provider: Bonner Hendricks in Treatment: 5 Active Problems ICD-10 Encounter Code Description Active Date Diagnosis E11.52 Type 2 diabetes mellitus with diabetic peripheral 12/05/2016 Yes angiopathy with gangrene E11.42 Type 2 diabetes mellitus with diabetic polyneuropathy 12/05/2016 Yes L97.511 Non-pressure chronic ulcer of other part of right foot 12/05/2016 Yes limited to breakdown of skin L97.818 Non-pressure chronic ulcer of other part of right lower leg 12/05/2016 Yes with other specified severity Inactive Problems Resolved Problems Electronic Signature(s) Signed: 01/09/2017 3:56:26 PM By: Deborah Hendricks Entered By: Deborah Ham on 01/09/2017 14:12:36 Deborah Hendricks ([id number]) -------------------------------------------------------------------------------- Progress Note Details Patient Name: Deborah Hendricks Date of Service: 01/09/2017 1:30 PM Medical Record Patient Account Number: [id number] Number: Treating RN: Ahmed Prima 18-Apr-1946 (70 y.o. Other  Clinician: Date of Birth/Sex: Female) Treating Deborah Hendricks Primary Care Provider: Wendy Hendricks Provider/Extender: G Referring Provider: Bonner Hendricks in Treatment: 5 Subjective Chief Complaint Information obtained from Patient 12/05/16; patient is here for review of 3 wounds on the right lower extremity. 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 She also has chronic renal failure on dialysis, diabetic peripheral neuropathy. She recently was revascularized by Dr. Trula Slade vascular surgery. This includes extensive procedures including an atherectomy of the right superficial femoral and popliteal artery, drug-coated balloon angioplasty of the right superficial femoral and popliteal artery, balloon angioplasty of the right tibioperoneal trunk and balloon angioplasty of the right peroneal artery. This was done for 2 areas in her right foot. She also has an open area on the right patella that is been there for several weeks. The patient thinks she may have hit this she isn't sure. She is really totally insensate she does not complain of any pain. She has a dialysis catheter in the right femoral artery. Apparently the veins in her arm are too small to perform a shunt 12/19/16; this is a type II diabetic on dialysis. She also has PAD status post revascularization by Dr. Trula Slade. She was here last week with a small open wound on her remaining right great toe tip, lateral aspect of the right first metatarsal head and a small eschar over the right knee. I didn't debridement any of this largely secondary to recent revascularization. She arrives today with a  blister on the tip of her first toe. The area on the right knee I think is healing. The area on the lateral first metatarsal head is about the same 12/26/16; this is a type II diabetic with a history of significant PAD also chronic renal failure on dialysis. He has an area on the right great toe tip which is her last remaining toe on the right foot. She is also had a very proximal left above-knee amputation. The surface eschar on the toe wound has come off. On arrival she had thick hypertrophic tissue which did not look like a healed state to me. She also had some eschar on her right knee we wiped off and she has a healed state there. 01/09/17;arrives with a surface on the tip of the right great toe. using Aquacel Objective Deborah Hendricks, Deborah Hendricks ([id number]) Constitutional Sitting or standing Blood Pressure is within target range for  patient.. Pulse regular and within target range for patient.[name] Respirations regular, non-labored and within target range.. Temperature is normal and within the target range for the patient.. Patient's appearance is neat and clean. Appears in no acute distress. Well nourished and well developed.. Vitals Time Taken: 1:40 PM, Weight: 150 lbs, Temperature: 98.2 F, Pulse: 75 bpm, Respiratory Rate: 16 breaths/min, Blood Pressure: 119/57 mmHg. General Notes: Wound exam; again I removed the relative large amount of necrotic nonviable tissue with a #3 curet she still has an open area under all of this but I don't think is totally closed. The debridement today was a little more aggressive than last week. I'll change the dressing to silver collagen Integumentary (Hair, Skin) Wound #1 status is Open. Original cause of wound was Blister. The wound is located on the [employer]. The wound measures 0.1cm length x 0.1cm width x 0.1cm depth; 0.008cm^2 area and 0.001cm^3 volume. There is no tunneling or undermining noted. There is a medium amount of serous drainage noted. The  wound margin is distinct with the outline attached to the wound base. There is no granulation within the wound bed. There is a large (67-100%) amount of necrotic tissue within the wound bed including Eschar. Assessment Active Problems ICD-10 E11.52 - Type 2 diabetes mellitus with diabetic peripheral angiopathy with gangrene E11.42 - Type 2 diabetes mellitus with diabetic polyneuropathy L97.511 - Non-pressure chronic ulcer of other part of right foot limited to breakdown of skin L97.818 - Non-pressure chronic ulcer of other part of right lower leg with other specified severity Procedures Wound #1 Wound #1 is a Diabetic Wound/Ulcer of the Lower Extremity located on the Right,Plantar Toe Great . There was a Skin/Subcutaneous Tissue Debridement [id number]) debridement with total area of 0.01 sq cm performed by Deborah Dillon, Deborah Hendricks. with the following instrument(s): Blade and Curette to remove Viable and Non-Viable tissue/material including Exudate, Fibrin/Slough, and Subcutaneous after achieving pain control using Lidocaine 4% Topical Solution. A time out was conducted at 13:47, prior to the start of [place] ([id number]) the procedure. A Minimum amount of bleeding was controlled with Pressure. The procedure was tolerated well with a pain level of 0 throughout and a pain level of 0 following the procedure. Post Debridement Measurements: 0.2cm length x 0.2cm width x 0.1cm depth; 0.003cm^3 volume. Character of Wound/Ulcer Post Debridement requires further debridement. Severity of Tissue Post Debridement is: Fat layer exposed. Post procedure Diagnosis Wound #1: Same as Pre-Procedure Plan Wound Cleansing: Wound #1 Right,Plantar Toe Great: Clean wound with Normal Saline. Cleanse wound with mild soap and water Primary Wound Dressing: Wound #1 Right,Plantar Toe Great: Prisma Ag - moisten with saline Secondary Dressing: Wound #1 Right,Plantar Toe Great: Conform/Kerlix Foam Dressing  Change Frequency: Wound #1 Right,Plantar Toe Great: Change dressing every other day. Follow-up Appointments: Wound #1 Right,Plantar Toe Great: Return Appointment in 1 week. Edema Control: Wound #1 Right,Plantar Toe Great: Elevate legs to the level of the heart and pump ankles as often as possible Off-Loading: Wound #1 Right,Plantar Toe Great: Other: - float and elevate heel when lying in the bed change the primary dressing to [employer]) Signed: 01/09/2017 3:56:26 PM By: Deborah Hendricks [place], Deborah Hendricks ([id number]) Entered By: Deborah Ham on 01/09/2017 [place], [place] ([id number]) -------------------------------------------------------------------------------- SuperBill Details Patient Name: Deborah Hendricks Date of Service: 01/09/2017 Medical Record Patient Account Number: [id number] Number: Treating RN: Ahmed Prima [birth year] (70 y.o. Other Clinician: Date of Birth/Sex: Female) Treating Neil Brickell, [place] Primary Care Provider: Wendy Hendricks Provider/Extender: Darnell Level  Referring Provider: Wendy Hendricks Service Line: Outpatient Weeks in Treatment: 5 Diagnosis Coding ICD-10 Codes Code Description E11.52 Type 2 diabetes mellitus with diabetic peripheral angiopathy with gangrene E11.42 Type 2 diabetes mellitus with diabetic polyneuropathy L97.511 Non-pressure chronic ulcer of other part of right foot limited to breakdown of skin L97.818 Non-pressure chronic ulcer of other part of right lower leg with other specified severity Facility Procedures CPT4: Description Modifier Quantity Code [id number] - DEB SUBQ TISSUE 20 SQ CM/< 1 ICD-10 Description Diagnosis L97.511 Non-pressure chronic ulcer of other part of right foot limited to breakdown of skin Physician Procedures CPT4: Description Modifier Quantity Code [id number] - WC PHYS SUBQ TISS 20 SQ CM 1 ICD-10 Description Diagnosis L97.511 Non-pressure chronic ulcer of other part of right foot  limited to breakdown of skin Electronic Signature(s) Signed: 01/09/2017 3:56:26 PM By: Deborah Hendricks Entered By: Deborah Ham on 01/09/2017 14:18:36

## 2017-01-10 NOTE — Progress Notes (Addendum)
KEARRA, MCKENNA ([id number]) Visit Report for 01/09/2017 Arrival Information Details Patient Name: Deborah Hendricks, Deborah Hendricks Date of Service: 01/09/2017 1:30 PM Medical Record Number: [id number] Patient Account Number: [id number] Date of Birth/Sex: 1945/11/18 (71 y.o. Female) Treating RN: Carolyne Fiscal, Debi Primary Care Twisha Vanpelt: Wendy Poet Other Clinician: Referring Lijah Bourque: Wendy Poet Treating Kanyah Matsushima/Extender: Tito Dine in Treatment: 5 Visit Information History Since Last Visit All ordered tests and consults were completed: No Patient Arrived: Wheel Chair Added or deleted any medications: No Arrival Time: 13:37 Any new allergies or adverse reactions: No Accompanied By: caregiver Had a fall or experienced change in No Transfer Assistance: None activities of daily living that may affect Patient Identification Verified: Yes risk of falls: Secondary Verification Process Yes Signs or symptoms of abuse/neglect since last No Completed: visito Patient Requires Transmission- No Hospitalized since last visit: No Based Precautions: Has Dressing in Place as Prescribed: Yes Patient Has Alerts: Yes Pain Present Now: No Patient Alerts: DM II ELIQUIS 10/25/16 ABI (R) 0.76 Per [facility] Electronic Signature(s) Signed: 01/09/2017 2:09:16 PM By: Gretta Cool, RN, BSN, Kim RN, BSN Entered By: Gretta Cool, RN, BSN, Kim on 01/09/2017 14:09:15 Deborah Hendricks ([id number]) -------------------------------------------------------------------------------- Encounter Discharge Information Details Patient Name: Deborah Hendricks Date of Service: 01/09/2017 1:30 PM Medical Record Number: [id number] Patient Account Number: [id number] Date of Birth/Sex: 1946/04/12 (70 y.o. Female) Treating RN: Carolyne Fiscal, Debi Primary Care Sylvan Lahm: Wendy Poet Other Clinician: Referring Layn Kye: Wendy Poet Treating Corrin Hingle/Extender: Tito Dine in Treatment: 5 Encounter Discharge Information Items Discharge Pain  Level: 0 Discharge Condition: Stable Ambulatory Status: Wheelchair Discharge Destination: Home Transportation: Private Auto Accompanied By: caregiver Schedule Follow-up Appointment: Yes Medication Reconciliation completed and provided to Patient/Care No Matisyn Cabeza: Provided on Clinical Summary of Care: 01/09/2017 Form Type Recipient Paper Patient [facility] Electronic Signature(s) Signed: 01/09/2017 1:59:09 PM By: Ruthine Dose Entered By: Ruthine Dose on 01/09/2017 13:59:09 Deborah Hendricks ([id number]) -------------------------------------------------------------------------------- Lower Extremity Assessment Details Patient Name: Deborah Hendricks Date of Service: 01/09/2017 1:30 PM Medical Record Number: [id number] Patient Account Number: [id number] Date of Birth/Sex: 01-09-1946 (70 y.o. Female) Treating RN: Carolyne Fiscal, Debi Primary Care Ludwin Flahive: Wendy Poet Other Clinician: Referring Priyal Musquiz: Wendy Poet Treating Jakalyn Kratky/Extender: Tito Dine in Treatment: 5 Vascular Assessment Pulses: Dorsalis Pedis Palpable: [Right:No] Doppler Audible: [Right:Yes] Posterior Tibial Extremity colors, hair growth, and conditions: Extremity Color: [Right:Hyperpigmented] Temperature of Extremity: [Right:Warm] Capillary Refill: [Right:< 3 seconds] Electronic Signature(s) Signed: 01/09/2017 4:23:48 PM By: Alric Quan Entered By: Alric Quan on 01/09/2017 13:49:33 Deborah Hendricks ([id number]) -------------------------------------------------------------------------------- Multi Wound Chart Details Patient Name: Deborah Hendricks Date of Service: 01/09/2017 1:30 PM Medical Record Number: [id number] Patient Account Number: [id number] Date of Birth/Sex: 1945-12-22 (71 y.o. 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 Comorbid History: Anemia, Arrhythmia, N/A N/A Congestive Heart Failure, Hypertension, Peripheral Venous Disease, Type II Diabetes, End Stage Renal Disease, Osteoarthritis, Neuropathy, Seizure Disorder Date Acquired: 10/05/2016 N/A N/A Weeks of Treatment: 5 N/A N/A Wound Status: Open N/A N/A Pending Amputation on Yes N/A N/A Presentation: Measurements L x W x D 0.1x0.1x0.1 N/A N/A (cm) Area (cm) : 0.008 N/A N/A Volume (cm) : 0.001 N/A N/A % Reduction in Area: 0.00% N/A N/A  Deborah Hendricks, Deborah Hendricks ([id number]) % Reduction in Volume: 0.00% N/A N/A Classification: Grade 2 N/A N/A Exudate Amount: Medium N/A N/A Exudate Type: Serous N/A N/A Exudate Color: amber N/A N/A Wound Margin: Distinct, outline attached N/A N/A Granulation Amount: None Present (0%) N/A N/A Necrotic Amount: Large (67-100%) N/A N/A Necrotic Tissue: Eschar N/A N/A Epithelialization: None N/A N/A Debridement: Debridement [id number] N/A N/A [id number]) Pre-procedure 13:47 N/A N/A Verification/Time Out Taken: Pain Control: Lidocaine 4% Topical N/A N/A Solution Tissue Debrided: Fibrin/Slough, Exudates, N/A N/A Subcutaneous Level: Skin/Subcutaneous N/A N/A Tissue Debridement Area (sq 0.01 N/A N/A cm): Instrument: Blade, Curette N/A N/A Bleeding: Minimum N/A N/A Hemostasis Achieved: Pressure N/A N/A Procedural Pain: 0 N/A N/A Post Procedural Pain: 0 N/A N/A Debridement Treatment Procedure was tolerated N/A N/A Response: well Post Debridement 0.2x0.2x0.1 N/A N/A Measurements L x W x D (cm) Post Debridement 0.003 N/A N/A Volume: (cm) Periwound Skin Texture: No Abnormalities  Noted N/A N/A Periwound Skin No Abnormalities Noted N/A N/A Moisture: Periwound Skin Color: No Abnormalities Noted N/A N/A Tenderness on No N/A N/A Palpation: Wound Preparation: Ulcer Cleansing: N/A N/A Rinsed/Irrigated with Saline Topical Anesthetic Applied: Other: lidocaine 4% Procedures Performed: Debridement N/A N/A Deborah Hendricks, Deborah Hendricks ([id number]) Treatment Notes Wound #1 (Right, Plantar Toe Great) 1. Cleansed with: Clean wound with Normal Saline 2. Anesthetic Topical Lidocaine 4% cream to wound bed prior to debridement 4. Dressing Applied: Prisma Ag 5. Secondary Dressing Applied Dry Gauze Kerlix/Conform 7. Secured with Tape Notes netting Electronic Signature(s) Signed: 01/09/2017 3:56:26 PM By: Linton Ham MD Entered By: Linton Ham on 01/09/2017 14:13:04 Deborah Hendricks ([id number]) -------------------------------------------------------------------------------- Multi-Disciplinary Care Plan Details Patient Name: Deborah Hendricks Date of Service: 01/09/2017 1:30 PM Medical Record Number: [id number] Patient Account Number: [id number] Date of Birth/Sex: 1946/10/07 (70 y.o. Female) Treating RN: Carolyne Fiscal, Debi Primary Care Finola Rosal: Wendy Poet Other Clinician: Referring Teandra Harlan: Wendy Poet Treating Ludean Duhart/Extender: Tito Dine in Treatment: 5 Active Inactive Electronic Signature(s) Signed: 01/29/2017 1:53:32 PM By: Gretta Cool RN, BSN, Kim RN, BSN Signed: 03/12/2017 4:43:54 PM By: Alric Quan Previous Signature: 01/09/2017 4:23:48 PM Version By: Alric Quan Entered By: Gretta Cool RN, BSN, Kim on 01/29/2017 13:53:31 Deborah Hendricks ([id number]) -------------------------------------------------------------------------------- Pain Assessment Details Patient Name: Deborah Hendricks Date of Service: 01/09/2017 1:30 PM Medical Record Number: [id number] Patient Account Number: [id number] Date of Birth/Sex: December 16, 1945 (70 y.o. Female) Treating RN: Carolyne Fiscal,  Debi Primary Care Toddy Boyd: Wendy Poet Other Clinician: Referring Angellica Maddison: Wendy Poet Treating Thorn Demas/Extender: Tito Dine in Treatment: 5 Active Problems Location of Pain Severity and Description of Pain Patient Has Paino No Site Locations With Dressing Change: No Pain Management and Medication Current Pain Management: Electronic Signature(s) Signed: 01/09/2017 4:23:48 PM By: Alric Quan Entered By: Alric Quan on 01/09/2017 13:40:03 Deborah Hendricks ([id number]) -------------------------------------------------------------------------------- Patient/Caregiver Education Details Patient Name: Deborah Hendricks Date of Service: 01/09/2017 1:30 PM Medical Record Patient Account Number: [id number] Number: Treating RN: Ahmed Prima [birth year] (70 y.o. Other Clinician: Date of Birth/Gender: Female) Treating ROBSON, [place] Primary Care Physician: Wendy Poet Physician/Extender: G Referring Physician: Bonner Puna in Treatment: 5 Education Assessment Education Provided To: Patient Education Topics Provided Wound/Skin Impairment: Handouts: Other: change dressing as ordered Methods: Demonstration, Explain/Verbal Responses: State content correctly Electronic Signature(s) Signed: 01/09/2017 4:23:48 PM By: Alric Quan Entered By: Alric Quan on 01/09/2017 13:59:06 Deborah Hendricks ([id number]) -------------------------------------------------------------------------------- Wound Assessment Details Patient Name: Deborah Hendricks Date of Service: 01/09/2017 1:30 PM Medical Record Number: [id number] Patient Account Number: [id number] Date of Birth/Sex: 18-Aug-1946 (71 y.o. Female) Treating RN: Ahmed Prima Primary Care  Laquincy Eastridge: Wendy Poet Other Clinician: Referring Arantxa Piercey: Wendy Poet Treating Fermina Mishkin/Extender: Tito Dine in Treatment: 5 Wound Status Wound Number: 1 Primary Diabetic Wound/Ulcer of the  Lower Etiology: Extremity Wound Location: Right Toe Great - Plantar Wound Open Wounding Event: Blister Status: Date Acquired: 10/05/2016 Comorbid Anemia, Arrhythmia, Congestive Heart Weeks Of Treatment: 5 History: Failure, Hypertension, Peripheral Clustered Wound: No Venous Disease, Type II Diabetes, End Pending Amputation On Presentation Stage Renal Disease, Osteoarthritis, Neuropathy, Seizure Disorder Photos Photo Uploaded By: Alric Quan on 01/09/2017 14:08:08 Wound Measurements Length: (cm) 0.1 Width: (cm) 0.1 Depth: (cm) 0.1 Area: (cm) 0.008 Volume: (cm) 0.001 % Reduction in Area: 0% % Reduction in Volume: 0% Epithelialization: None Tunneling: No Undermining: No Wound Description Classification: Grade 2 Foul Odor Afte Wound Margin: Distinct, outline attached Slough/Fibrino Exudate Amount: Medium Exudate Type: Serous Exudate Color: amber r Cleansing: No No Wound Bed Granulation Amount: None Present (0%) Necrotic Amount: Large (67-100%) Deborah Hendricks, Deborah Hendricks ([id number]) Necrotic Quality: Eschar Periwound Skin Texture Texture Color No Abnormalities Noted: No No Abnormalities Noted: No Moisture No Abnormalities Noted: No Wound Preparation Ulcer Cleansing: Rinsed/Irrigated with Saline Topical Anesthetic Applied: Other: lidocaine 4%, Electronic Signature(s) Signed: 01/09/2017 4:23:48 PM By: Alric Quan Entered By: Alric Quan on 01/09/2017 13:49:11 Deborah Hendricks ([id number]) -------------------------------------------------------------------------------- Vitals Details Patient Name: Deborah Hendricks Date of Service: 01/09/2017 1:30 PM Medical Record Number: [id number] Patient Account Number: [id number] Date of Birth/Sex: 07-Jun-1946 (71 y.o. Female) Treating RN: Carolyne Fiscal, Debi Primary Care Braxdon Gappa: Wendy Poet Other Clinician: Referring Tayler Heiden: Wendy Poet Treating Margarito Dehaas/Extender: Tito Dine in Treatment: 5 Vital  Signs Time Taken: 13:40 Temperature (F): 98.2 Weight (lbs): 150 Pulse (bpm): 75 Respiratory Rate (breaths/min): 16 Blood Pressure (mmHg): 119/57 Reference Range: 80 - 120 mg / dl Electronic Signature(s) Signed: 01/09/2017 4:23:48 PM By: Alric Quan Entered By: Alric Quan on 01/09/2017 13:41:15

## 2017-01-11 ENCOUNTER — Encounter: Payer: Self-pay | Admitting: Surgery

## 2017-01-12 DIAGNOSIS — N186 End stage renal disease: Secondary | ICD-10-CM | POA: Diagnosis not present

## 2017-01-12 DIAGNOSIS — N2581 Secondary hyperparathyroidism of renal origin: Secondary | ICD-10-CM | POA: Diagnosis not present

## 2017-01-12 DIAGNOSIS — E119 Type 2 diabetes mellitus without complications: Secondary | ICD-10-CM | POA: Diagnosis not present

## 2017-01-12 DIAGNOSIS — D631 Anemia in chronic kidney disease: Secondary | ICD-10-CM | POA: Diagnosis not present

## 2017-01-15 DIAGNOSIS — N2581 Secondary hyperparathyroidism of renal origin: Secondary | ICD-10-CM | POA: Diagnosis not present

## 2017-01-15 DIAGNOSIS — E119 Type 2 diabetes mellitus without complications: Secondary | ICD-10-CM | POA: Diagnosis not present

## 2017-01-15 DIAGNOSIS — D631 Anemia in chronic kidney disease: Secondary | ICD-10-CM | POA: Diagnosis not present

## 2017-01-15 DIAGNOSIS — N186 End stage renal disease: Secondary | ICD-10-CM | POA: Diagnosis not present

## 2017-01-16 ENCOUNTER — Ambulatory Visit: Payer: Medicare Other | Admitting: Internal Medicine

## 2017-01-17 DIAGNOSIS — N2581 Secondary hyperparathyroidism of renal origin: Secondary | ICD-10-CM | POA: Diagnosis not present

## 2017-01-17 DIAGNOSIS — D631 Anemia in chronic kidney disease: Secondary | ICD-10-CM | POA: Diagnosis not present

## 2017-01-17 DIAGNOSIS — E119 Type 2 diabetes mellitus without complications: Secondary | ICD-10-CM | POA: Diagnosis not present

## 2017-01-17 DIAGNOSIS — N186 End stage renal disease: Secondary | ICD-10-CM | POA: Diagnosis not present

## 2017-01-19 DIAGNOSIS — N2581 Secondary hyperparathyroidism of renal origin: Secondary | ICD-10-CM | POA: Diagnosis not present

## 2017-01-19 DIAGNOSIS — N186 End stage renal disease: Secondary | ICD-10-CM | POA: Diagnosis not present

## 2017-01-19 DIAGNOSIS — E119 Type 2 diabetes mellitus without complications: Secondary | ICD-10-CM | POA: Diagnosis not present

## 2017-01-19 DIAGNOSIS — D631 Anemia in chronic kidney disease: Secondary | ICD-10-CM | POA: Diagnosis not present

## 2017-01-21 ENCOUNTER — Inpatient Hospital Stay (HOSPITAL_COMMUNITY): Admission: RE | Admit: 2017-01-21 | Payer: Medicare Other | Source: Ambulatory Visit

## 2017-01-21 ENCOUNTER — Ambulatory Visit: Payer: Medicare Other | Admitting: Surgery

## 2017-01-22 DIAGNOSIS — E119 Type 2 diabetes mellitus without complications: Secondary | ICD-10-CM | POA: Diagnosis not present

## 2017-01-22 DIAGNOSIS — N186 End stage renal disease: Secondary | ICD-10-CM | POA: Diagnosis not present

## 2017-01-22 DIAGNOSIS — N2581 Secondary hyperparathyroidism of renal origin: Secondary | ICD-10-CM | POA: Diagnosis not present

## 2017-01-22 DIAGNOSIS — D631 Anemia in chronic kidney disease: Secondary | ICD-10-CM | POA: Diagnosis not present

## 2017-01-23 DIAGNOSIS — N186 End stage renal disease: Secondary | ICD-10-CM | POA: Diagnosis not present

## 2017-01-23 DIAGNOSIS — T82898A Other specified complication of vascular prosthetic devices, implants and grafts, initial encounter: Secondary | ICD-10-CM | POA: Diagnosis not present

## 2017-01-23 DIAGNOSIS — Z992 Dependence on renal dialysis: Secondary | ICD-10-CM | POA: Diagnosis not present

## 2017-01-24 DIAGNOSIS — N2581 Secondary hyperparathyroidism of renal origin: Secondary | ICD-10-CM | POA: Diagnosis not present

## 2017-01-24 DIAGNOSIS — E119 Type 2 diabetes mellitus without complications: Secondary | ICD-10-CM | POA: Diagnosis not present

## 2017-01-24 DIAGNOSIS — D631 Anemia in chronic kidney disease: Secondary | ICD-10-CM | POA: Diagnosis not present

## 2017-01-24 DIAGNOSIS — N186 End stage renal disease: Secondary | ICD-10-CM | POA: Diagnosis not present

## 2017-01-25 ENCOUNTER — Encounter (HOSPITAL_BASED_OUTPATIENT_CLINIC_OR_DEPARTMENT_OTHER): Payer: Medicare Other | Attending: Internal Medicine

## 2017-01-25 ENCOUNTER — Encounter: Payer: Self-pay | Admitting: Surgery

## 2017-01-25 DIAGNOSIS — N186 End stage renal disease: Secondary | ICD-10-CM | POA: Diagnosis not present

## 2017-01-25 DIAGNOSIS — E1140 Type 2 diabetes mellitus with diabetic neuropathy, unspecified: Secondary | ICD-10-CM | POA: Diagnosis not present

## 2017-01-25 DIAGNOSIS — E11621 Type 2 diabetes mellitus with foot ulcer: Secondary | ICD-10-CM | POA: Insufficient documentation

## 2017-01-25 DIAGNOSIS — E1122 Type 2 diabetes mellitus with diabetic chronic kidney disease: Secondary | ICD-10-CM | POA: Insufficient documentation

## 2017-01-25 DIAGNOSIS — E1151 Type 2 diabetes mellitus with diabetic peripheral angiopathy without gangrene: Secondary | ICD-10-CM | POA: Insufficient documentation

## 2017-01-25 DIAGNOSIS — Z8673 Personal history of transient ischemic attack (TIA), and cerebral infarction without residual deficits: Secondary | ICD-10-CM | POA: Diagnosis not present

## 2017-01-25 DIAGNOSIS — L97512 Non-pressure chronic ulcer of other part of right foot with fat layer exposed: Secondary | ICD-10-CM | POA: Insufficient documentation

## 2017-01-25 DIAGNOSIS — I120 Hypertensive chronic kidney disease with stage 5 chronic kidney disease or end stage renal disease: Secondary | ICD-10-CM | POA: Diagnosis not present

## 2017-01-26 DIAGNOSIS — N2581 Secondary hyperparathyroidism of renal origin: Secondary | ICD-10-CM | POA: Diagnosis not present

## 2017-01-26 DIAGNOSIS — D631 Anemia in chronic kidney disease: Secondary | ICD-10-CM | POA: Diagnosis not present

## 2017-01-26 DIAGNOSIS — E119 Type 2 diabetes mellitus without complications: Secondary | ICD-10-CM | POA: Diagnosis not present

## 2017-01-26 DIAGNOSIS — N186 End stage renal disease: Secondary | ICD-10-CM | POA: Diagnosis not present

## 2017-01-28 ENCOUNTER — Encounter: Payer: Self-pay | Admitting: Surgery

## 2017-01-28 ENCOUNTER — Ambulatory Visit (INDEPENDENT_AMBULATORY_CARE_PROVIDER_SITE_OTHER): Payer: Medicare Other | Admitting: Surgery

## 2017-01-28 ENCOUNTER — Ambulatory Visit (HOSPITAL_COMMUNITY)
Admission: RE | Admit: 2017-01-28 | Discharge: 2017-01-28 | Disposition: A | Discharge status: Home / Self Care | Payer: Medicare Other | Source: Ambulatory Visit | Attending: Surgery | Admitting: Surgery

## 2017-01-28 VITALS — BP 135/71 | HR 66 | Temp 98.4°F | Resp 16 | Ht 66.0 in | Wt 150.0 lb

## 2017-01-28 DIAGNOSIS — I739 Peripheral vascular disease, unspecified: Secondary | ICD-10-CM | POA: Diagnosis not present

## 2017-01-28 DIAGNOSIS — E1151 Type 2 diabetes mellitus with diabetic peripheral angiopathy without gangrene: Secondary | ICD-10-CM | POA: Diagnosis not present

## 2017-01-28 DIAGNOSIS — L97911 Non-pressure chronic ulcer of unspecified part of right lower leg limited to breakdown of skin: Secondary | ICD-10-CM

## 2017-01-28 DIAGNOSIS — Z87891 Personal history of nicotine dependence: Secondary | ICD-10-CM | POA: Insufficient documentation

## 2017-01-28 DIAGNOSIS — Z89612 Acquired absence of left leg above knee: Secondary | ICD-10-CM | POA: Insufficient documentation

## 2017-01-28 DIAGNOSIS — Z9862 Peripheral vascular angioplasty status: Secondary | ICD-10-CM | POA: Diagnosis not present

## 2017-01-28 DIAGNOSIS — I10 Essential (primary) hypertension: Secondary | ICD-10-CM | POA: Insufficient documentation

## 2017-01-28 DIAGNOSIS — E785 Hyperlipidemia, unspecified: Secondary | ICD-10-CM | POA: Diagnosis not present

## 2017-01-28 DIAGNOSIS — Z89421 Acquired absence of other right toe(s): Secondary | ICD-10-CM | POA: Diagnosis not present

## 2017-01-28 NOTE — Progress Notes (Signed)
[facility]  Patient name: Deborah Hendricks MRN: [id number] DOB: [birth year] Sex: female   REASON FOR VISIT:    f/u  HISOTRY OF PRESENT ILLNESS:   Deborah Hendricks is a 71 y.o. female who returns today for evaluation of a right leg ulcer.  This has been present for many weeks.  She is status post left above-knee amputation on 12/15/2015.  She also suffers from end-stage renal disease.  She has undergone a basilic vein procedure which has not developed.  She has a catheter in her right thigh.  He has a history of stroke.  She is on anticoagulation.  She suffers from diabetes with multiple complications.  On 11/27/2016 she underwent atherectomy and drug coated balloon angioplasty of the superficial femoral and popliteal artery on the right.  She also had balloon angioplasty of the stenosis within the tibioperoneal trunk and proximal peroneal artery.  She has single-vessel runoff via the peroneal artery.  According to the patient her wounds have been improving.  PAST MEDICAL HISTORY:   Past Medical History:  Diagnosis Date  . Anemia   . Anxiety   . CHF (congestive heart failure) ([place])   . Complication of anesthesia    "I got real high BP" (11/27/2016)  . Constipation   . Critical lower limb ischemia   . Depression   . ESRD on dialysis [facility])    "TTS; [place]" (11/27/2016)  . GERD (gastroesophageal reflux disease)   . Heart murmur   . High cholesterol   . Hypertension   . Memory loss    since stroke 04/2015  . Peripheral vascular disease ([place])   . Refusal of blood transfusions as patient is Jehovah's Witness   . Seizures ([place]) 04/2015   "when I had a stroke"  . Sleep apnea    "not using mask anymore" (11/27/2016)  . Stroke ([place]) 04/2015   unable to stand .  can have diffuuclty with speech at times; "may not remember qthing since stroke" (11/27/2016)  . Stroke ([place]) 2015  . Type II diabetes mellitus ([place])    "been on  insulin since I was 71 years old"     FAMILY HISTORY:   Family History  Problem Relation Age of Onset  . Asthma Mother   . Cancer Sister     SOCIAL HISTORY:   Social History  Substance Use Topics  . Smoking status: Former Smoker    Packs/day: 0.25    Years: 51.00    Types: Cigarettes    Quit date: 04/13/2015  . Smokeless tobacco: Never Used  . Alcohol use 0.0 oz/week     Comment: 11/27/2016 "stopped in 04/2015"     ALLERGIES:   Allergies  Allergen Reactions  . Penicillins Hives, Itching and Rash    Has patient had a PCN reaction causing immediate rash, facial/tongue/throat swelling, SOB or lightheadedness with hypotension: Yes Has patient had a PCN reaction causing severe rash involving mucus membranes or skin necrosis:  Has patient had a PCN reaction that required hospitalization  Has patient had a PCN reaction occurring within the last 10 years:  If all of the above answers are "NO", then may proceed with Cephalosporin use.  [name] Amoxicillin Hives, Itching and Rash  . Latex Rash  . Sulfa Antibiotics Other (See Comments)    Unknown      CURRENT MEDICATIONS:   Current Outpatient Prescriptions  Medication Sig Dispense Refill  . apixaban (ELIQUIS) 5 MG TABS tablet Take 5 mg by mouth  2 (two) times daily.     [name] aspirin EC 81 MG tablet Take 1 tablet (81 mg total) by mouth daily. 60 tablet 1  . calcium acetate (PHOSLO) 667 MG capsule Take 667 mg by mouth 3 (three) times daily with meals.    . carvedilol (COREG) 3.125 MG tablet Take 1 tablet (3.125 mg total) by mouth 2 (two) times daily with a meal. 60 tablet 1  . citalopram (CELEXA) 10 MG tablet Take 10 mg by mouth daily.  3  . esomeprazole (NEXIUM) 20 MG capsule Take 20 mg by mouth daily.  3  . gabapentin (NEURONTIN) 100 MG capsule Take 100 mg by mouth 3 (three) times daily.    . insulin aspart (NOVOLOG) 100 UNIT/ML injection Inject 2-10 Units into the skin 3 (three) times daily before meals. 151-200=2 units, 201-250=4  units, 251-300=6 units, 301-350=8 units, 351-400=10 units, >400=10 units and Call MD    . multivitamin (RENA-VIT) TABS tablet Take 1 tablet by mouth daily.    . Nutritional Supplements (FEEDING SUPPLEMENT, NEPRO CARB STEADY,) LIQD Take 237 mLs by mouth 2 (two) times daily between meals.    . ondansetron (ZOFRAN) 4 MG tablet Take 4 mg by mouth every 8 (eight) hours as needed for nausea or vomiting.     No current facility-administered medications for this visit.     REVIEW OF SYSTEMS:   [X]  denotes positive finding, [ ]  denotes negative finding Cardiac  Comments:  Chest pain or chest pressure:    Shortness of breath upon exertion:    Short of breath when lying flat:    Irregular heart rhythm:        Vascular    Pain in calf, thigh, or hip brought on by ambulation:    Pain in feet at night that wakes you up from your sleep:     Blood clot in your veins:    Leg swelling:         Pulmonary    Oxygen at home:    Productive cough:     Wheezing:         Neurologic    Sudden weakness in arms or legs:     Sudden numbness in arms or legs:     Sudden onset of difficulty speaking or slurred speech:    Temporary loss of vision in one eye:     Problems with dizziness:         Gastrointestinal    Blood in stool:     Vomited blood:         Genitourinary    Burning when urinating:     Blood in urine:        Psychiatric    Major depression:         Hematologic    Bleeding problems:    Problems with blood clotting too easily:        Skin    Rashes or ulcers: x       Constitutional    Fever or chills:      PHYSICAL EXAM:   Vitals:   01/28/17 1558  BP: 135/71  Pulse: 66  Resp: 16  Temp: 98.4 F (36.9 C)  TempSrc: Oral  SpO2: 100%  Weight: 150 lb (68 kg)  Height: 5\' 6"  (1.676 m)    GENERAL: The patient is a well-nourished female, in no acute distress. The vital signs are documented above. CARDIAC: There is a regular rate and rhythm.  VASCULAR: Dressings over top of  her  wounds of the right foot were not changed as this is being done at the wound center.  She has a right thigh dialysis catheter in place. PULMONARY: Non-labored respirations ABDOMEN: Soft and non-tender with normal pitched bowel sounds.  MUSCULOSKELETAL: There are no major deformities or cyanosis. NEUROLOGIC: No focal weakness or paresthesias are detected. SKIN: There are no ulcers or rashes noted. PSYCHIATRIC: The patient has a normal affect.  STUDIES:   ABIs were performed today which showed calcified vessels.  Waveforms are monophasic in the tibial vessels  MEDICAL ISSUES:   Right leg atherosclerotic disease: According to the patient her wounds are healing.  I have her scheduled for follow-up in 3 months with a duplex of her right leg to evaluate the most recent intervention  End-stage renal disease: The patient had a venogram at the time of her last arteriogram which shows a patent axillary vein.  I think she would be a candidate for a right upper arm dialysis graft.  The patient has had multiple procedures done by Dr. Harrington Challenger and [facility], and she is contemplating going back there for her next procedure.    [name], MD [facility] [phone] Pager [phone]

## 2017-01-29 DIAGNOSIS — E119 Type 2 diabetes mellitus without complications: Secondary | ICD-10-CM | POA: Diagnosis not present

## 2017-01-29 DIAGNOSIS — N186 End stage renal disease: Secondary | ICD-10-CM | POA: Diagnosis not present

## 2017-01-29 DIAGNOSIS — D631 Anemia in chronic kidney disease: Secondary | ICD-10-CM | POA: Diagnosis not present

## 2017-01-29 DIAGNOSIS — N2581 Secondary hyperparathyroidism of renal origin: Secondary | ICD-10-CM | POA: Diagnosis not present

## 2017-01-29 NOTE — Addendum Note (Signed)
Addended by: Lianne Cure A on: 01/29/2017 09:59 AM   Modules accepted: Orders

## 2017-01-31 ENCOUNTER — Observation Stay (HOSPITAL_COMMUNITY)
Admission: EM | Admit: 2017-01-31 | Discharge: 2017-02-01 | Disposition: A | Discharge status: Home / Self Care | Payer: Medicare Other | Attending: Infectious Disease | Admitting: Infectious Disease

## 2017-01-31 ENCOUNTER — Emergency Department (HOSPITAL_COMMUNITY): Payer: Medicare Other

## 2017-01-31 ENCOUNTER — Encounter (HOSPITAL_COMMUNITY): Payer: Self-pay | Admitting: Emergency Medicine

## 2017-01-31 DIAGNOSIS — E1151 Type 2 diabetes mellitus with diabetic peripheral angiopathy without gangrene: Secondary | ICD-10-CM | POA: Diagnosis not present

## 2017-01-31 DIAGNOSIS — E785 Hyperlipidemia, unspecified: Secondary | ICD-10-CM | POA: Diagnosis not present

## 2017-01-31 DIAGNOSIS — E7800 Pure hypercholesterolemia, unspecified: Secondary | ICD-10-CM | POA: Diagnosis not present

## 2017-01-31 DIAGNOSIS — Z87891 Personal history of nicotine dependence: Secondary | ICD-10-CM | POA: Insufficient documentation

## 2017-01-31 DIAGNOSIS — R29810 Facial weakness: Secondary | ICD-10-CM | POA: Insufficient documentation

## 2017-01-31 DIAGNOSIS — I132 Hypertensive heart and chronic kidney disease with heart failure and with stage 5 chronic kidney disease, or end stage renal disease: Secondary | ICD-10-CM | POA: Diagnosis not present

## 2017-01-31 DIAGNOSIS — E11319 Type 2 diabetes mellitus with unspecified diabetic retinopathy without macular edema: Secondary | ICD-10-CM | POA: Insufficient documentation

## 2017-01-31 DIAGNOSIS — I6789 Other cerebrovascular disease: Secondary | ICD-10-CM | POA: Diagnosis not present

## 2017-01-31 DIAGNOSIS — I480 Paroxysmal atrial fibrillation: Secondary | ICD-10-CM

## 2017-01-31 DIAGNOSIS — Z992 Dependence on renal dialysis: Secondary | ICD-10-CM | POA: Diagnosis not present

## 2017-01-31 DIAGNOSIS — E1122 Type 2 diabetes mellitus with diabetic chronic kidney disease: Secondary | ICD-10-CM | POA: Insufficient documentation

## 2017-01-31 DIAGNOSIS — Z7901 Long term (current) use of anticoagulants: Secondary | ICD-10-CM | POA: Insufficient documentation

## 2017-01-31 DIAGNOSIS — R29898 Other symptoms and signs involving the musculoskeletal system: Secondary | ICD-10-CM | POA: Insufficient documentation

## 2017-01-31 DIAGNOSIS — L97511 Non-pressure chronic ulcer of other part of right foot limited to breakdown of skin: Secondary | ICD-10-CM

## 2017-01-31 DIAGNOSIS — Z8673 Personal history of transient ischemic attack (TIA), and cerebral infarction without residual deficits: Secondary | ICD-10-CM | POA: Diagnosis not present

## 2017-01-31 DIAGNOSIS — L97519 Non-pressure chronic ulcer of other part of right foot with unspecified severity: Secondary | ICD-10-CM | POA: Insufficient documentation

## 2017-01-31 DIAGNOSIS — K219 Gastro-esophageal reflux disease without esophagitis: Secondary | ICD-10-CM | POA: Insufficient documentation

## 2017-01-31 DIAGNOSIS — I509 Heart failure, unspecified: Secondary | ICD-10-CM | POA: Diagnosis not present

## 2017-01-31 DIAGNOSIS — N186 End stage renal disease: Secondary | ICD-10-CM | POA: Insufficient documentation

## 2017-01-31 DIAGNOSIS — F411 Generalized anxiety disorder: Secondary | ICD-10-CM | POA: Insufficient documentation

## 2017-01-31 DIAGNOSIS — I120 Hypertensive chronic kidney disease with stage 5 chronic kidney disease or end stage renal disease: Secondary | ICD-10-CM | POA: Diagnosis not present

## 2017-01-31 DIAGNOSIS — Z794 Long term (current) use of insulin: Secondary | ICD-10-CM | POA: Diagnosis not present

## 2017-01-31 DIAGNOSIS — Z90710 Acquired absence of both cervix and uterus: Secondary | ICD-10-CM | POA: Insufficient documentation

## 2017-01-31 DIAGNOSIS — Z66 Do not resuscitate: Secondary | ICD-10-CM | POA: Insufficient documentation

## 2017-01-31 DIAGNOSIS — Z7982 Long term (current) use of aspirin: Secondary | ICD-10-CM | POA: Insufficient documentation

## 2017-01-31 DIAGNOSIS — Z880 Allergy status to penicillin: Secondary | ICD-10-CM | POA: Insufficient documentation

## 2017-01-31 DIAGNOSIS — R471 Dysarthria and anarthria: Secondary | ICD-10-CM | POA: Insufficient documentation

## 2017-01-31 DIAGNOSIS — I6523 Occlusion and stenosis of bilateral carotid arteries: Secondary | ICD-10-CM | POA: Insufficient documentation

## 2017-01-31 DIAGNOSIS — G459 Transient cerebral ischemic attack, unspecified: Principal | ICD-10-CM | POA: Insufficient documentation

## 2017-01-31 DIAGNOSIS — I639 Cerebral infarction, unspecified: Secondary | ICD-10-CM

## 2017-01-31 DIAGNOSIS — E11621 Type 2 diabetes mellitus with foot ulcer: Secondary | ICD-10-CM | POA: Insufficient documentation

## 2017-01-31 DIAGNOSIS — G40909 Epilepsy, unspecified, not intractable, without status epilepticus: Secondary | ICD-10-CM | POA: Diagnosis not present

## 2017-01-31 DIAGNOSIS — F329 Major depressive disorder, single episode, unspecified: Secondary | ICD-10-CM | POA: Insufficient documentation

## 2017-01-31 DIAGNOSIS — E08621 Diabetes mellitus due to underlying condition with foot ulcer: Secondary | ICD-10-CM

## 2017-01-31 LAB — COMPREHENSIVE METABOLIC PANEL
ALBUMIN: 3.2 g/dL — AB (ref 3.5–5.0)
ALT: 17 U/L (ref 14–54)
AST: 17 U/L (ref 15–41)
Alkaline Phosphatase: 128 U/L — ABNORMAL HIGH (ref 38–126)
Anion gap: 13 (ref 5–15)
BUN: 59 mg/dL — AB (ref 6–20)
CHLORIDE: 97 mmol/L — AB (ref 101–111)
CO2: 25 mmol/L (ref 22–32)
Calcium: 9.1 mg/dL (ref 8.9–10.3)
Creatinine, Ser: 6.87 mg/dL — ABNORMAL HIGH (ref 0.44–1.00)
GFR calc Af Amer: 6 mL/min — ABNORMAL LOW (ref >60)
GFR, EST NON AFRICAN AMERICAN: 5 mL/min — AB (ref >60)
Glucose, Bld: 123 mg/dL — ABNORMAL HIGH (ref 65–99)
POTASSIUM: 5 mmol/L (ref 3.5–5.1)
Sodium: 135 mmol/L (ref 135–145)
Total Bilirubin: 0.6 mg/dL (ref 0.3–1.2)
Total Protein: 6.4 g/dL — ABNORMAL LOW (ref 6.5–8.1)

## 2017-01-31 LAB — CBC
HCT: 28.5 % — ABNORMAL LOW (ref 36.0–46.0)
Hemoglobin: 9 g/dL — ABNORMAL LOW (ref 12.0–15.0)
MCH: 28.9 pg (ref 26.0–34.0)
MCHC: 31.6 g/dL (ref 30.0–36.0)
MCV: 91.6 fL (ref 78.0–100.0)
Platelets: 193 10*3/uL (ref 150–400)
RBC: 3.11 MIL/uL — ABNORMAL LOW (ref 3.87–5.11)
RDW: 14.7 % (ref 11.5–15.5)
WBC: 6.9 10*3/uL (ref 4.0–10.5)

## 2017-01-31 LAB — DIFFERENTIAL
BASOS ABS: 0 10*3/uL (ref 0.0–0.1)
BASOS PCT: 0 %
Eosinophils Absolute: 0.4 10*3/uL (ref 0.0–0.7)
Eosinophils Relative: 6 %
Lymphocytes Relative: 31 %
Lymphs Abs: 2.1 10*3/uL (ref 0.7–4.0)
Monocytes Absolute: 0.6 10*3/uL (ref 0.1–1.0)
Monocytes Relative: 8 %
NEUTROS PCT: 55 %
Neutro Abs: 3.7 10*3/uL (ref 1.7–7.7)

## 2017-01-31 LAB — PROTIME-INR
INR: 1.32
PROTHROMBIN TIME: 16.5 s — AB (ref 11.4–15.2)

## 2017-01-31 LAB — I-STAT TROPONIN, ED: TROPONIN I, POC: 0 ng/mL (ref 0.00–0.08)

## 2017-01-31 LAB — I-STAT CHEM 8, ED
BUN: 54 mg/dL — ABNORMAL HIGH (ref 6–20)
CREATININE: 6.8 mg/dL — AB (ref 0.44–1.00)
Calcium, Ion: 1.05 mmol/L — ABNORMAL LOW (ref 1.15–1.40)
Chloride: 100 mmol/L — ABNORMAL LOW (ref 101–111)
Glucose, Bld: 121 mg/dL — ABNORMAL HIGH (ref 65–99)
HEMATOCRIT: 28 % — AB (ref 36.0–46.0)
HEMOGLOBIN: 9.5 g/dL — AB (ref 12.0–15.0)
POTASSIUM: 4.9 mmol/L (ref 3.5–5.1)
Sodium: 134 mmol/L — ABNORMAL LOW (ref 135–145)
TCO2: 27 mmol/L (ref 0–100)

## 2017-01-31 LAB — CBG MONITORING, ED: GLUCOSE-CAPILLARY: 113 mg/dL — AB (ref 65–99)

## 2017-01-31 LAB — APTT: APTT: 49 s — AB (ref 24–36)

## 2017-01-31 MED ORDER — GABAPENTIN 100 MG PO CAPS: 100.0000 mg | ORAL_CAPSULE | Freq: Every day | ORAL | Status: DC | PRN

## 2017-01-31 MED ORDER — CITALOPRAM HYDROBROMIDE 10 MG PO TABS
10.0000 mg | ORAL_TABLET | Freq: Every day | ORAL | Status: DC
Start: 2017-02-01 — End: 2017-02-01
  Administered 2017-02-01: 10 mg via ORAL
  Filled 2017-01-31: qty 1

## 2017-01-31 MED ORDER — INSULIN ASPART 100 UNIT/ML ~~LOC~~ SOLN
0.0000 [IU] | Freq: Three times a day (TID) | SUBCUTANEOUS | Status: DC
Start: 2017-02-01 — End: 2017-02-01

## 2017-01-31 MED ORDER — SODIUM CHLORIDE 0.9% FLUSH: 3.0000 mL | INTRAVENOUS | Status: DC | PRN

## 2017-01-31 MED ORDER — SODIUM CHLORIDE 0.9% FLUSH: 3.0000 mL | Freq: Two times a day (BID) | INTRAVENOUS | Status: DC

## 2017-01-31 MED ORDER — SODIUM CHLORIDE 0.9 % IV SOLN: 250.0000 mL | INTRAVENOUS | Status: DC | PRN

## 2017-01-31 MED ORDER — INSULIN ASPART 100 UNIT/ML ~~LOC~~ SOLN: 0.0000 [IU] | Freq: Every day | SUBCUTANEOUS | Status: DC

## 2017-01-31 MED ORDER — ASPIRIN EC 81 MG PO TBEC
81.0000 mg | DELAYED_RELEASE_TABLET | Freq: Every day | ORAL | Status: DC
  Administered 2017-02-01: 81 mg via ORAL
  Filled 2017-01-31: qty 1

## 2017-01-31 MED ORDER — RENA-VITE PO TABS: 1.0000 | ORAL_TABLET | Freq: Every day | ORAL | Status: DC

## 2017-01-31 MED ORDER — PANTOPRAZOLE SODIUM 40 MG PO TBEC
40.0000 mg | DELAYED_RELEASE_TABLET | Freq: Every day | ORAL | Status: DC
  Administered 2017-02-01: 40 mg via ORAL
  Filled 2017-01-31: qty 1

## 2017-01-31 MED ORDER — CALCIUM ACETATE (PHOS BINDER) 667 MG PO CAPS
667.0000 mg | ORAL_CAPSULE | Freq: Three times a day (TID) | ORAL | Status: DC
  Administered 2017-02-01: 667 mg via ORAL
  Filled 2017-01-31: qty 1

## 2017-01-31 MED ORDER — SODIUM CHLORIDE 0.9% FLUSH
3.0000 mL | Freq: Two times a day (BID) | INTRAVENOUS | Status: DC
  Administered 2017-02-01: 3 mL via INTRAVENOUS

## 2017-01-31 MED ORDER — ACETAMINOPHEN 325 MG PO TABS: 650.0000 mg | ORAL_TABLET | Freq: Four times a day (QID) | ORAL | Status: DC | PRN

## 2017-01-31 MED ORDER — ACETAMINOPHEN 650 MG RE SUPP: 650.0000 mg | Freq: Four times a day (QID) | RECTAL | Status: DC | PRN

## 2017-01-31 MED ORDER — POLYETHYLENE GLYCOL 3350 17 G PO PACK: 17.0000 g | PACK | Freq: Every day | ORAL | Status: DC | PRN

## 2017-01-31 MED ORDER — STROKE: EARLY STAGES OF RECOVERY BOOK
Freq: Once | Status: AC
  Administered 2017-02-01: 03:00:00
  Filled 2017-01-31: qty 1

## 2017-01-31 MED ORDER — APIXABAN 5 MG PO TABS
5.0000 mg | ORAL_TABLET | Freq: Two times a day (BID) | ORAL | Status: DC
  Administered 2017-02-01: 5 mg via ORAL
  Filled 2017-01-31: qty 1

## 2017-01-31 NOTE — ED Notes (Signed)
Sent add on label to main label

## 2017-01-31 NOTE — Consult Note (Signed)
Admission H&P    Chief Complaint: Worsening of dysarthria, and right facial droop.  HPI: Deborah Hendricks is an 71 y.o. female history of diabetes mellitus, previous left cerebral infarctions, seizure disorder, hypertension, hyperlipidemia, PAF and ESRD on dialysis, brought to the ED with facial droop and worsening of dysarthria. Patient did not experience any changes in strength or sensation involving extremities. She is complaining of feeling lethargic. He missed dialysis today, and last had dialysis on 01/29/2017. CT scan of the head showed no acute intracranial abnormality. NIH score at the time of this evaluation was 3. She has been on anticoagulation with apixaban, as well as aspirin daily.  LSN: 4:30 PM on 01/31/2017. tPA Given: No: On anticoagulation with apixaban; beyond time window for treatment consideration with TPA mRankin:  Past Medical History:  Diagnosis Date  . Anemia   . Anxiety   . CHF (congestive heart failure) ([place])   . Complication of anesthesia    "I got real high BP" (11/27/2016)  . Constipation   . Critical lower limb ischemia   . Depression   . ESRD on dialysis [facility])    "TTS; [place]" (11/27/2016)  . GERD (gastroesophageal reflux disease)   . Heart murmur   . High cholesterol   . Hypertension   . Memory loss    since stroke 04/2015  . Peripheral vascular disease ([place])   . Refusal of blood transfusions as patient is Jehovah's Witness   . Seizures ([place]) 04/2015   "when I had a stroke"  . Sleep apnea    "not using mask anymore" (11/27/2016)  . Stroke ([place]) 04/2015   unable to stand .  can have diffuuclty with speech at times; "may not remember qthing since stroke" (11/27/2016)  . Stroke ([place]) 2015  . Type II diabetes mellitus ([place])    "been on insulin since I was 71 years old"    Past Surgical History:  Procedure Laterality Date  . ABDOMINAL HYSTERECTOMY    . AMPUTATION Left 12/15/2015   Procedure: LEFT ABOVE KNEE AMPUTATION;  Surgeon: Serafina Mitchell, MD;   Location: [place];  Service: Vascular;  Laterality: Left;  . BASCILIC VEIN TRANSPOSITION Right 11/25/2015   Procedure: Right Arm FIRST STAGE BASILIC VEIN TRANSPOSITION;  Surgeon: Serafina Mitchell, MD;  Location: [place];  Service: Vascular;  Laterality: Right;  . BASCILIC VEIN TRANSPOSITION Right 01/19/2016   Procedure: RIGHT SECOND STAGE BASILIC VEIN TRANSPOSITION;  Surgeon: Serafina Mitchell, MD;  Location: [place];  Service: Vascular;  Laterality: Right;  . DILATION AND CURETTAGE OF UTERUS    . EYE SURGERY    . PERIPHERAL VASCULAR CATHETERIZATION Bilateral 11/28/2015   Procedure: Lower Extremity Angiography;  Surgeon: Lorretta Harp, MD;  Location: [place] [facility];  Service: Cardiovascular;  Laterality: Bilateral;  . PERIPHERAL VASCULAR CATHETERIZATION N/A 11/28/2015   Procedure: Abdominal Aortogram;  Surgeon: Lorretta Harp, MD;  Location: [place] [facility];  Service: Cardiovascular;  Laterality: N/A;  . PERIPHERAL VASCULAR CATHETERIZATION Right 11/27/2016  . PERIPHERAL VASCULAR CATHETERIZATION N/A 11/27/2016   Procedure: Abdominal Aortogram w/Lower Extremity;  Surgeon: Serafina Mitchell, MD;  Location: [place] [facility];  Service: Cardiovascular;  Laterality: N/A;  . PERIPHERAL VASCULAR CATHETERIZATION N/A 11/27/2016   Procedure: Right arm and central venogram;  Surgeon: Serafina Mitchell, MD;  Location: [place] [facility];  Service: Cardiovascular;  Laterality: N/A;  . PERIPHERAL VASCULAR CATHETERIZATION Right 11/27/2016   Procedure: Peripheral Vascular Atherectomy;  Surgeon: Serafina Mitchell, MD;  Location: [place] [facility];  Service: Cardiovascular;  Laterality: Right;  Rt    sfa, popliteal, tibioperoneal trunk, peroneal  . PERIPHERAL VASCULAR CATHETERIZATION Right 11/27/2016   Procedure: Peripheral Vascular Balloon Angioplasty;  Surgeon: Serafina Mitchell, MD;  Location: [place] [facility];  Service: Cardiovascular;  Laterality: Right;  Tibial peroneal  . RETINOPATHY SURGERY Bilateral   . TOE  AMPUTATION Right    2nd, 3rd, 4th, 5th digits  . TONSILLECTOMY      Family History  Problem Relation Age of Onset  . Asthma Mother   . Cancer Sister    Social History:  reports that she quit smoking about 21 months ago. Her smoking use included Cigarettes. She has a 12.75 pack-year smoking history. She has never used smokeless tobacco. She reports that she drinks alcohol. She reports that she does not use drugs.  Allergies:  Allergies  Allergen Reactions  . Penicillins Hives, Itching and Rash    Has patient had a PCN reaction causing immediate rash, facial/tongue/throat swelling, SOB or lightheadedness with hypotension: Yes Has patient had a PCN reaction causing severe rash involving mucus membranes or skin necrosis:  Has patient had a PCN reaction that required hospitalization  Has patient had a PCN reaction occurring within the last 10 years:  If all of the above answers are "NO", then may proceed with Cephalosporin use.  [name] Amoxicillin Hives, Itching and Rash  . Latex Rash  . Sulfa Antibiotics Other (See Comments)    Unknown     Medications: Preadmission medications were reviewed by me.  ROS: History obtained from the patient  General ROS: negative for - chills, fatigue, fever, night sweats, weight gain or weight loss Psychological ROS: negative for - behavioral disorder, hallucinations, memory difficulties, mood swings or suicidal ideation Ophthalmic ROS: negative for - blurry vision, double vision, eye pain or loss of vision ENT ROS: negative for - epistaxis, nasal discharge, oral lesions, sore throat, tinnitus or vertigo Allergy and Immunology ROS: negative for - hives or itchy/watery eyes Hematological and Lymphatic ROS: negative for - bleeding problems, bruising or swollen lymph nodes Endocrine ROS: negative for - galactorrhea, hair pattern changes, polydipsia/polyuria or temperature intolerance Respiratory ROS: negative for - cough, hemoptysis, shortness of breath or  wheezing Cardiovascular ROS: negative for - chest pain, dyspnea on exertion, edema or irregular heartbeat Gastrointestinal ROS: negative for - abdominal pain, diarrhea, hematemesis, nausea/vomiting or stool incontinence Genito-Urinary ROS: negative for - dysuria, hematuria, incontinence or urinary frequency/urgency Musculoskeletal ROS: negative for - joint swelling or muscular weakness Neurological ROS: as noted in HPI Dermatological ROS: negative for rash and skin lesion changes  Physical Examination: Blood pressure (!) 167/78, pulse 71, temperature 97.6 F (36.4 C), resp. rate 15, height 5' (1.524 m), weight 69.4 kg (153 lb), SpO2 100 %.  HEENT-  Normocephalic, no lesions, without obvious abnormality.  Normal external eye and conjunctiva.  Normal TM's bilaterally.  Normal auditory canals and external ears. Normal external nose, mucus membranes and septum.  Normal pharynx. Neck supple with no masses, nodes, nodules or enlargement. Cardiovascular - regular rate and rhythm, S1, S2 normal, no murmur, click, rub or gallop Lungs - chest clear, no wheezing, rales, normal symmetric air entry Abdomen - soft, non-tender; bowel sounds normal; no masses,  no organomegaly Extremities - left AKA amputation; amputation of toes 2 through 5 on the right; flexion contraction right knee.  Neurologic Examination: Mental Status: Alert, oriented, no acute distress.  Speech moderately slurred without evidence of aphasia. Able to  follow commands without difficulty. Cranial Nerves: II-Visual fields were normal. III/IV/VI-Pupils were equal and small and reacted to light was difficult to assess. Extraocular movements were full and conjugate.    V/VII-reduced perception tactile sensation over the right side of the face compared to leftower facial weakness. VIII-normal. X-moderate dysarthria. XI: trapezius strength/neck flexion strength normal bilaterally Motor: 5/5 bilaterally with normal tone and bulk Sensory:  no numbness over her extremities to tactile stimulation. Deep Tendon Reflexes:  absent throughout. Plantarmute on the right  Cerebellar: Normal finger-to-nose testing. Carotid auscultation: Normal  Results for orders placed or performed during the hospital encounter of 01/31/17 (from the past 48 hour(s))  CBG monitoring, ED     Status: Abnormal   Collection Time: 01/31/17  8:24 PM  Result Value Ref Range   Glucose-Capillary 113 (H) 65 - 99 mg/dL  Protime-INR     Status: Abnormal   Collection Time: 01/31/17  8:24 PM  Result Value Ref Range   Prothrombin Time 16.5 (H) 11.4 - 15.2 seconds   INR 1.32   APTT     Status: Abnormal   Collection Time: 01/31/17  8:24 PM  Result Value Ref Range   aPTT 49 (H) 24 - 36 seconds    Comment:        IF BASELINE aPTT IS ELEVATED, SUGGEST PATIENT RISK ASSESSMENT BE USED TO DETERMINE APPROPRIATE ANTICOAGULANT THERAPY.   CBC     Status: Abnormal   Collection Time: 01/31/17  8:24 PM  Result Value Ref Range   WBC 6.9 4.0 - 10.5 K/uL   RBC 3.11 (L) 3.87 - 5.11 MIL/uL   Hemoglobin 9.0 (L) 12.0 - 15.0 g/dL   HCT 28.5 (L) 36.0 - 46.0 %   MCV 91.6 78.0 - 100.0 fL   MCH 28.9 26.0 - 34.0 pg   MCHC 31.6 30.0 - 36.0 g/dL   RDW 14.7 11.5 - 15.5 %   Platelets 193 150 - 400 K/uL  Differential     Status: None   Collection Time: 01/31/17  8:24 PM  Result Value Ref Range   Neutrophils Relative % 55 %   Neutro Abs 3.7 1.7 - 7.7 K/uL   Lymphocytes Relative 31 %   Lymphs Abs 2.1 0.7 - 4.0 K/uL   Monocytes Relative 8 %   Monocytes Absolute 0.6 0.1 - 1.0 K/uL   Eosinophils Relative 6 %   Eosinophils Absolute 0.4 0.0 - 0.7 K/uL   Basophils Relative 0 %   Basophils Absolute 0.0 0.0 - 0.1 K/uL  Comprehensive metabolic panel     Status: Abnormal   Collection Time: 01/31/17  8:24 PM  Result Value Ref Range   Sodium 135 135 - 145 mmol/L   Potassium 5.0 3.5 - 5.1 mmol/L   Chloride 97 (L) 101 - 111 mmol/L   CO2 25 22 - 32 mmol/L   Glucose, Bld 123 (H) 65 - 99  mg/dL   BUN 59 (H) 6 - 20 mg/dL   Creatinine, Ser 6.87 (H) 0.44 - 1.00 mg/dL   Calcium 9.1 8.9 - 10.3 mg/dL   Total Protein 6.4 (L) 6.5 - 8.1 g/dL   Albumin 3.2 (L) 3.5 - 5.0 g/dL   AST 17 15 - 41 U/L   ALT 17 14 - 54 U/L   Alkaline Phosphatase 128 (H) 38 - 126 U/L   Total Bilirubin 0.6 0.3 - 1.2 mg/dL   GFR calc non Af Amer 5 (L) >60 mL/min   GFR calc Af Amer 6 (L) >60 mL/min  Comment: (NOTE) The eGFR has been calculated using the CKD EPI equation. This calculation has not been validated in all clinical situations. eGFR's persistently <60 mL/min signify possible Chronic Kidney Disease.    Anion gap 13 5 - 15  I-stat troponin, ED     Status: None   Collection Time: 01/31/17  8:30 PM  Result Value Ref Range   Troponin i, poc 0.00 0.00 - 0.08 ng/mL   Comment 3            Comment: Due to the release kinetics of cTnI, a negative result within the first hours of the onset of symptoms does not rule out myocardial infarction with certainty. If myocardial infarction is still suspected, repeat the test at appropriate intervals.   I-Stat Chem 8, ED     Status: Abnormal   Collection Time: 01/31/17  8:32 PM  Result Value Ref Range   Sodium 134 (L) 135 - 145 mmol/L   Potassium 4.9 3.5 - 5.1 mmol/L   Chloride 100 (L) 101 - 111 mmol/L   BUN 54 (H) 6 - 20 mg/dL   Creatinine, Ser 6.80 (H) 0.44 - 1.00 mg/dL   Glucose, Bld 121 (H) 65 - 99 mg/dL   Calcium, Ion 1.05 (L) 1.15 - 1.40 mmol/L   TCO2 27 0 - 100 mmol/L   Hemoglobin 9.5 (L) 12.0 - 15.0 g/dL   HCT 28.0 (L) 36.0 - 46.0 %   Ct Head Code Stroke W/o Cm  Result Date: 01/31/2017 CLINICAL DATA:  Code stroke. Generalized cerebral atrophy with mild chronic microvascular disease. EXAM: CT HEAD WITHOUT CONTRAST TECHNIQUE: Contiguous axial images were obtained from the base of the skull through the vertex without intravenous contrast. COMPARISON:  Prior MRI from 07/29/2016. FINDINGS: Brain: Generalized cerebral atrophy with mild chronic  microvascular ischemic disease. No acute intracranial hemorrhage. No findings to suggest acute large vessel territory infarct. No mass lesion, midline shift, or mass effect. No hydrocephalus. No extra-axial fluid collection. Vascular: No hyperdense vessel. Scattered vascular calcifications noted within the carotid siphons. Skull: Scalp soft tissues within normal limits.  Calvarium intact. Sinuses/Orbits: Globes and orbital soft tissues within normal limits. Patient is status post lens extraction bilaterally. Paranasal sinuses and mastoid air cells are clear. Other: None. ASPECTS [facility] Stroke Program Early CT Score) - Ganglionic level infarction (caudate, lentiform nuclei, internal capsule, insula, M1-M3 cortex): 7 - Supraganglionic infarction (M4-M6 cortex): 3 Total score (0-10 with 10 being normal): 10 IMPRESSION: 1. No acute intracranial infarct or other process identified. 2. ASPECTS is 10 3. Generalized cerebral atrophy with chronic microvascular ischemic disease, stable. Critical Value/emergent results were called by telephone at the time of interpretation on 01/31/2017 at 9:07 pm to Dr. Nicole Kindred, who verbally acknowledged these results. Electronically Signed   By: Jeannine Boga M.D.   On: 01/31/2017 21:09    Assessment: 71 y.o. female with multiple risk factors for stroke as well as previous cerebral infarctions, presenting with probable recurrent subcortical left CVA.  Stroke Risk Factors - atrial fibrillation, diabetes mellitus, family history, hyperlipidemia and hypertension  Plan: 1. HgbA1c, fasting lipid panel 2. MRI, MRA  of the brain without contrast 3. PT consult, OT consult, Speech consult 4. Echocardiogram 5. Carotid dopplers 6. Prophylactic therapy- apixaban 7. Risk factor modification 8. Telemetry monitoring  C.R. Nicole Kindred, MD [facility] [phone]  01/31/2017, 9:16 PM

## 2017-01-31 NOTE — ED Triage Notes (Signed)
Per EMS: Pt's family found pt at [facility]. Pt had aphasia and L facial droop. PT a&oX4. Pt is a dialysis pt and missed dialysis today.  Hx of Stroke.  150/86 70 NSR 123 CBG  99% RA
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
## 2017-01-31 NOTE — ED Provider Notes (Signed)
[place] DEPT Provider Note   CSN: [id number] Arrival date & time: 01/31/17  2021     History   Chief Complaint Chief Complaint  Patient presents with  . Code Stroke    HPI Deborah Hendricks is a 71 y.o. female.  Pt presents to the ED today as a code stroke.  She is a dialysis patient and missed dialysis today.  Last dialysis was 3/20.  Pt has a hx of prior strokes and peripheral vascular disease.  She is on Eliquis.  Pt developed right facial and arm/leg weakness and slurred speech around 1630 today.      Past Medical History:  Diagnosis Date  . Anemia   . Anxiety   . CHF (congestive heart failure) ([place])   . Complication of anesthesia    "I got real high BP" (11/27/2016)  . Constipation   . Critical lower limb ischemia   . Depression   . ESRD on dialysis [facility])    "TTS; [place]" (11/27/2016)  . GERD (gastroesophageal reflux disease)   . Heart murmur   . High cholesterol   . Hypertension   . Memory loss    since stroke 04/2015  . Peripheral vascular disease ([place])   . Refusal of blood transfusions as patient is Jehovah's Witness   . Seizures ([place]) 04/2015   "when I had a stroke"  . Sleep apnea    "not using mask anymore" (11/27/2016)  . Stroke ([place]) 04/2015   unable to stand .  can have diffuuclty with speech at times; "may not remember qthing since stroke" (11/27/2016)  . Stroke ([place]) 2015  . Type II diabetes mellitus ([place])    "been on insulin since I was 71 years old"    Patient Active Problem List   Diagnosis Date Noted  . PVD (peripheral vascular disease) ([place]) 11/27/2016  . Blurry vision   . Visual changes 07/26/2016  . Asymmetric SNHL (sensorineural hearing loss) 06/12/2016  . Essential hypertension 06/08/2016  . History of pulmonary embolism 06/08/2016  . History of stroke 06/08/2016  . History of borderline personality disorder 04/19/2016  . History of medication noncompliance 04/19/2016  . Paroxysmal atrial fibrillation ([place]) 04/19/2016  .  Patient is Jehovah's Witness 04/19/2016  . Seizure disorder ([place]) 04/19/2016  . Renovascular hypertension 04/13/2016  . ESRD on dialysis ([place]) 04/11/2016  . Generalized anxiety disorder 04/11/2016  . Major depression, recurrent, chronic ([place]) 04/11/2016  . S/P AKA (above knee amputation) unilateral ([place]) 04/11/2016  . PAD (peripheral artery disease) ([place]) 12/15/2015  . Critical lower limb ischemia 11/16/2015  . Type 2 diabetes mellitus with left diabetic foot ulcer ([place]) 10/21/2015  . Type II diabetes mellitus with neurological manifestations ([place]) 10/21/2015    Past Surgical History:  Procedure Laterality Date  . ABDOMINAL HYSTERECTOMY    . AMPUTATION Left 12/15/2015   Procedure: LEFT ABOVE KNEE AMPUTATION;  Surgeon: Serafina Mitchell, MD;  Location: [facility];  Service: Vascular;  Laterality: Left;  . BASCILIC VEIN TRANSPOSITION Right 11/25/2015   Procedure: Right Arm FIRST STAGE BASILIC VEIN TRANSPOSITION;  Surgeon: Serafina Mitchell, MD;  Location: [place];  Service: Vascular;  Laterality: Right;  . BASCILIC VEIN TRANSPOSITION Right 01/19/2016   Procedure: RIGHT SECOND STAGE BASILIC VEIN TRANSPOSITION;  Surgeon: Serafina Mitchell, MD;  Location: [facility];  Service: Vascular;  Laterality: Right;  . DILATION AND CURETTAGE OF UTERUS    . EYE SURGERY    . PERIPHERAL VASCULAR CATHETERIZATION Bilateral 11/28/2015   Procedure: Lower Extremity Angiography;  Surgeon: Lorretta Harp, MD;  Location: [place] CV LAB;  Service: Cardiovascular;  Laterality: Bilateral;  . PERIPHERAL VASCULAR CATHETERIZATION N/A 11/28/2015   Procedure: Abdominal Aortogram;  Surgeon: Lorretta Harp, MD;  Location: [place] CV [facility];  Service: Cardiovascular;  Laterality: N/A;  . PERIPHERAL VASCULAR CATHETERIZATION Right 11/27/2016  . PERIPHERAL VASCULAR CATHETERIZATION N/A 11/27/2016   Procedure: Abdominal Aortogram w/Lower Extremity;  Surgeon: Serafina Mitchell, MD;  Location: [place] CV [facility];  Service: Cardiovascular;   Laterality: N/A;  . PERIPHERAL VASCULAR CATHETERIZATION N/A 11/27/2016   Procedure: Right arm and central venogram;  Surgeon: Serafina Mitchell, MD;  Location: [place] CV LAB;  Service: Cardiovascular;  Laterality: N/A;  . PERIPHERAL VASCULAR CATHETERIZATION Right 11/27/2016   Procedure: Peripheral Vascular Atherectomy;  Surgeon: Serafina Mitchell, MD;  Location: [place] [facility];  Service: Cardiovascular;  Laterality: Right;  Rt    sfa, popliteal, tibioperoneal trunk, peroneal  . PERIPHERAL VASCULAR CATHETERIZATION Right 11/27/2016   Procedure: Peripheral Vascular Balloon Angioplasty;  Surgeon: Serafina Mitchell, MD;  Location: [place] CV [facility];  Service: Cardiovascular;  Laterality: Right;  Tibial peroneal  . RETINOPATHY SURGERY Bilateral   . TOE AMPUTATION Right    2nd, 3rd, 4th, 5th digits  . TONSILLECTOMY      OB History    No data available       Home Medications    Prior to Admission medications   Medication Sig Start Date End Date Taking? Authorizing Provider  apixaban (ELIQUIS) 5 MG TABS tablet Take 5 mg by mouth 2 (two) times daily.     Historical Provider, MD  aspirin EC 81 MG tablet Take 1 tablet (81 mg total) by mouth daily. 07/30/16   Reyne Dumas, MD  calcium acetate (PHOSLO) 667 MG capsule Take 667 mg by mouth 3 (three) times daily with meals.    Historical Provider, MD  carvedilol (COREG) 3.125 MG tablet Take 1 tablet (3.125 mg total) by mouth 2 (two) times daily with a meal. 07/30/16   Reyne Dumas, MD  citalopram (CELEXA) 10 MG tablet Take 10 mg by mouth daily. 09/27/16   Historical Provider, MD  esomeprazole (NEXIUM) 20 MG capsule Take 20 mg by mouth daily. 10/31/16   Historical Provider, MD  gabapentin (NEURONTIN) 100 MG capsule Take 100 mg by mouth 3 (three) times daily.    Historical Provider, MD  insulin aspart (NOVOLOG) 100 UNIT/ML injection Inject 2-10 Units into the skin 3 (three) times daily before meals. 151-200=2 units, 201-250=4 units, 251-300=6 units,  301-350=8 units, 351-400=10 units, >400=10 units and Call MD    Historical Provider, MD  multivitamin (RENA-VIT) TABS tablet Take 1 tablet by mouth daily.    Historical Provider, MD  Nutritional Supplements (FEEDING SUPPLEMENT, NEPRO CARB STEADY,) LIQD Take 237 mLs by mouth 2 (two) times daily between meals.    Historical Provider, MD  ondansetron (ZOFRAN) 4 MG tablet Take 4 mg by mouth every 8 (eight) hours as needed for nausea or vomiting.    Historical Provider, MD    Family History Family History  Problem Relation Age of Onset  . Asthma Mother   . Cancer Sister     Social History Social History  Substance Use Topics  . Smoking status: Former Smoker    Packs/day: 0.25    Years: 51.00    Types: Cigarettes    Quit date: 04/13/2015  . Smokeless tobacco: Never Used  . Alcohol use 0.0 oz/week     Comment: 11/27/2016 "stopped  in 04/2015"     Allergies   Penicillins; Amoxicillin; Latex; and Sulfa antibiotics   Review of Systems Review of Systems  Neurological: Positive for facial asymmetry, speech difficulty and weakness.  All other systems reviewed and are negative.    Physical Exam Updated Vital Signs BP (!) 168/76   Pulse 73   Temp 97.6 F (36.4 C)   Resp (!) 7   Ht 5' (1.524 m)   Wt 153 lb (69.4 kg)   SpO2 98%   BMI 29.88 kg/m   Physical Exam  Constitutional: She is oriented to person, place, and time. She appears well-developed and well-nourished.  HENT:  Head: Normocephalic and atraumatic.  Right Ear: External ear normal.  Left Ear: External ear normal.  Nose: Nose normal.  Mouth/Throat: Oropharynx is clear and moist.  Eyes: Conjunctivae and EOM are normal. Pupils are equal, round, and reactive to light.  Neck: Normal range of motion. Neck supple.  Cardiovascular: Normal rate, regular rhythm, normal heart sounds and intact distal pulses.   Pulmonary/Chest: Effort normal and breath sounds normal.  Abdominal: Soft. Bowel sounds are normal.  Musculoskeletal:  Normal range of motion.  Left aka  Neurological: She is alert and oriented to person, place, and time.  Right facial droop, right leg weakness, speech slurred  Skin: Skin is warm.  Chronic, healing wound to right large toe  Psychiatric: She has a normal mood and affect. Her behavior is normal. Thought content normal.  Nursing note and vitals reviewed.    ED Treatments / Results  Labs (all labs ordered are listed, but only abnormal results are displayed) Labs Reviewed  PROTIME-INR - Abnormal; Notable for the following:       Result Value   Prothrombin Time 16.5 (*)    All other components within normal limits  APTT - Abnormal; Notable for the following:    aPTT 49 (*)    All other components within normal limits  CBC - Abnormal; Notable for the following:    RBC 3.11 (*)    Hemoglobin 9.0 (*)    HCT 28.5 (*)    All other components within normal limits  COMPREHENSIVE METABOLIC PANEL - Abnormal; Notable for the following:    Chloride 97 (*)    Glucose, Bld 123 (*)    BUN 59 (*)    Creatinine, Ser 6.87 (*)    Total Protein 6.4 (*)    Albumin 3.2 (*)    Alkaline Phosphatase 128 (*)    GFR calc non Af Amer 5 (*)    GFR calc Af Amer 6 (*)    All other components within normal limits  CBG MONITORING, ED - Abnormal; Notable for the following:    Glucose-Capillary 113 (*)    All other components within normal limits  I-STAT CHEM 8, ED - Abnormal; Notable for the following:    Sodium 134 (*)    Chloride 100 (*)    BUN 54 (*)    Creatinine, Ser 6.80 (*)    Glucose, Bld 121 (*)    Calcium, Ion 1.05 (*)    Hemoglobin 9.5 (*)    HCT 28.0 (*)    All other components within normal limits  DIFFERENTIAL  I-STAT TROPOININ, ED  CBG MONITORING, ED    EKG  EKG Interpretation  Date/Time:  Thursday January 31 2017 20:42:14 EDT Ventricular Rate:  71 PR Interval:    QRS Duration: 88 QT Interval:  414 QTC Calculation: 450 R Axis:   11 Text Interpretation:  Sinus or ectopic atrial  rhythm Prolonged PR interval Anterior infarct, old No significant change since last tracing Confirmed by [facility] MD, Keanna Tugwell [phone]) on 01/31/2017 9:14:51 PM       Radiology Ct Head Code Stroke W/o Cm  Result Date: 01/31/2017 CLINICAL DATA:  Code stroke. Generalized cerebral atrophy with mild chronic microvascular disease. EXAM: CT HEAD WITHOUT CONTRAST TECHNIQUE: Contiguous axial images were obtained from the base of the skull through the vertex without intravenous contrast. COMPARISON:  Prior MRI from 07/29/2016. FINDINGS: Brain: Generalized cerebral atrophy with mild chronic microvascular ischemic disease. No acute intracranial hemorrhage. No findings to suggest acute large vessel territory infarct. No mass lesion, midline shift, or mass effect. No hydrocephalus. No extra-axial fluid collection. Vascular: No hyperdense vessel. Scattered vascular calcifications noted within the carotid siphons. Skull: Scalp soft tissues within normal limits.  Calvarium intact. Sinuses/Orbits: Globes and orbital soft tissues within normal limits. Patient is status post lens extraction bilaterally. Paranasal sinuses and mastoid air cells are clear. Other: None. ASPECTS [facility] Stroke Program Early CT Score) - Ganglionic level infarction (caudate, lentiform nuclei, internal capsule, insula, M1-M3 cortex): 7 - Supraganglionic infarction (M4-M6 cortex): 3 Total score (0-10 with 10 being normal): 10 IMPRESSION: 1. No acute intracranial infarct or other process identified. 2. ASPECTS is 10 3. Generalized cerebral atrophy with chronic microvascular ischemic disease, stable. Critical Value/emergent results were called by telephone at the time of interpretation on 01/31/2017 at 9:07 pm to Dr. Nicole Kindred, who verbally acknowledged these results. Electronically Signed   By: Jeannine Boga M.D.   On: 01/31/2017 21:09    Procedures Procedures (including critical care time)  Medications Ordered in ED Medications - No data to  display   Initial Impression / Assessment and Plan / ED Course  I have reviewed the triage vital signs and the nursing notes.  Pertinent labs & imaging results that were available during my care of the patient were reviewed by me and considered in my medical decision making (see chart for details).    Pt d/w Dr. Nicole Kindred (neurology) who responded to the code stroke.  Pt is not a candidate for tpa due to Eliquis use.  He recommends medicine admission and neurology to consult.  Pt d/w IM resident for admission (unassigned).    Final Clinical Impressions(s) / ED Diagnoses   Final diagnoses:  Cerebrovascular accident (CVA), unspecified mechanism ([place])  ESRD on hemodialysis [facility])    New Prescriptions New Prescriptions   No medications on file     Isla Pence, MD 01/31/17 2222

## 2017-02-01 ENCOUNTER — Observation Stay (HOSPITAL_COMMUNITY): Payer: Medicare Other

## 2017-02-01 ENCOUNTER — Encounter (HOSPITAL_COMMUNITY): Payer: Self-pay | Admitting: *Deleted

## 2017-02-01 ENCOUNTER — Observation Stay (HOSPITAL_BASED_OUTPATIENT_CLINIC_OR_DEPARTMENT_OTHER): Payer: Medicare Other

## 2017-02-01 ENCOUNTER — Other Ambulatory Visit: Payer: Self-pay | Admitting: Internal Medicine

## 2017-02-01 DIAGNOSIS — G458 Other transient cerebral ischemic attacks and related syndromes: Secondary | ICD-10-CM

## 2017-02-01 DIAGNOSIS — G459 Transient cerebral ischemic attack, unspecified: Secondary | ICD-10-CM

## 2017-02-01 DIAGNOSIS — R29810 Facial weakness: Secondary | ICD-10-CM | POA: Diagnosis not present

## 2017-02-01 DIAGNOSIS — Z8673 Personal history of transient ischemic attack (TIA), and cerebral infarction without residual deficits: Secondary | ICD-10-CM

## 2017-02-01 DIAGNOSIS — E1122 Type 2 diabetes mellitus with diabetic chronic kidney disease: Secondary | ICD-10-CM

## 2017-02-01 DIAGNOSIS — K219 Gastro-esophageal reflux disease without esophagitis: Secondary | ICD-10-CM | POA: Diagnosis not present

## 2017-02-01 DIAGNOSIS — E08621 Diabetes mellitus due to underlying condition with foot ulcer: Secondary | ICD-10-CM

## 2017-02-01 DIAGNOSIS — E11621 Type 2 diabetes mellitus with foot ulcer: Secondary | ICD-10-CM

## 2017-02-01 DIAGNOSIS — G40909 Epilepsy, unspecified, not intractable, without status epilepticus: Secondary | ICD-10-CM | POA: Diagnosis not present

## 2017-02-01 DIAGNOSIS — E785 Hyperlipidemia, unspecified: Secondary | ICD-10-CM | POA: Diagnosis not present

## 2017-02-01 DIAGNOSIS — I440 Atrioventricular block, first degree: Secondary | ICD-10-CM | POA: Diagnosis not present

## 2017-02-01 DIAGNOSIS — I120 Hypertensive chronic kidney disease with stage 5 chronic kidney disease or end stage renal disease: Secondary | ICD-10-CM

## 2017-02-01 DIAGNOSIS — L97519 Non-pressure chronic ulcer of other part of right foot with unspecified severity: Secondary | ICD-10-CM | POA: Diagnosis not present

## 2017-02-01 DIAGNOSIS — E1151 Type 2 diabetes mellitus with diabetic peripheral angiopathy without gangrene: Secondary | ICD-10-CM | POA: Diagnosis not present

## 2017-02-01 DIAGNOSIS — Z87891 Personal history of nicotine dependence: Secondary | ICD-10-CM

## 2017-02-01 DIAGNOSIS — Z794 Long term (current) use of insulin: Secondary | ICD-10-CM

## 2017-02-01 DIAGNOSIS — N186 End stage renal disease: Secondary | ICD-10-CM | POA: Diagnosis not present

## 2017-02-01 DIAGNOSIS — Z833 Family history of diabetes mellitus: Secondary | ICD-10-CM

## 2017-02-01 DIAGNOSIS — F411 Generalized anxiety disorder: Secondary | ICD-10-CM

## 2017-02-01 DIAGNOSIS — Z7901 Long term (current) use of anticoagulants: Secondary | ICD-10-CM

## 2017-02-01 DIAGNOSIS — Z91040 Latex allergy status: Secondary | ICD-10-CM

## 2017-02-01 DIAGNOSIS — R471 Dysarthria and anarthria: Secondary | ICD-10-CM

## 2017-02-01 DIAGNOSIS — Z880 Allergy status to penicillin: Secondary | ICD-10-CM

## 2017-02-01 DIAGNOSIS — Z89612 Acquired absence of left leg above knee: Secondary | ICD-10-CM

## 2017-02-01 DIAGNOSIS — Z882 Allergy status to sulfonamides status: Secondary | ICD-10-CM

## 2017-02-01 DIAGNOSIS — T7691XA Unspecified adult maltreatment, suspected, initial encounter: Secondary | ICD-10-CM

## 2017-02-01 DIAGNOSIS — Z89421 Acquired absence of other right toe(s): Secondary | ICD-10-CM

## 2017-02-01 DIAGNOSIS — Z881 Allergy status to other antibiotic agents status: Secondary | ICD-10-CM

## 2017-02-01 DIAGNOSIS — Z79899 Other long term (current) drug therapy: Secondary | ICD-10-CM

## 2017-02-01 DIAGNOSIS — Z66 Do not resuscitate: Secondary | ICD-10-CM

## 2017-02-01 DIAGNOSIS — I480 Paroxysmal atrial fibrillation: Secondary | ICD-10-CM | POA: Diagnosis not present

## 2017-02-01 DIAGNOSIS — Z993 Dependence on wheelchair: Secondary | ICD-10-CM

## 2017-02-01 DIAGNOSIS — L97511 Non-pressure chronic ulcer of other part of right foot limited to breakdown of skin: Secondary | ICD-10-CM

## 2017-02-01 LAB — CBC
HCT: 29.9 % — ABNORMAL LOW (ref 36.0–46.0)
Hemoglobin: 9.5 g/dL — ABNORMAL LOW (ref 12.0–15.0)
MCH: 29.1 pg (ref 26.0–34.0)
MCHC: 31.8 g/dL (ref 30.0–36.0)
MCV: 91.7 fL (ref 78.0–100.0)
PLATELETS: 201 10*3/uL (ref 150–400)
RBC: 3.26 MIL/uL — ABNORMAL LOW (ref 3.87–5.11)
RDW: 15 % (ref 11.5–15.5)
WBC: 7.1 10*3/uL (ref 4.0–10.5)

## 2017-02-01 LAB — GLUCOSE, CAPILLARY
Glucose-Capillary: 102 mg/dL — ABNORMAL HIGH (ref 65–99)
Glucose-Capillary: 75 mg/dL (ref 65–99)

## 2017-02-01 LAB — RENAL FUNCTION PANEL
Albumin: 2.9 g/dL — ABNORMAL LOW (ref 3.5–5.0)
Anion gap: 13 (ref 5–15)
BUN: 62 mg/dL — AB (ref 6–20)
CHLORIDE: 97 mmol/L — AB (ref 101–111)
CO2: 26 mmol/L (ref 22–32)
CREATININE: 6.98 mg/dL — AB (ref 0.44–1.00)
Calcium: 8.8 mg/dL — ABNORMAL LOW (ref 8.9–10.3)
GFR calc Af Amer: 6 mL/min — ABNORMAL LOW (ref >60)
GFR calc non Af Amer: 5 mL/min — ABNORMAL LOW (ref >60)
Glucose, Bld: 88 mg/dL (ref 65–99)
Phosphorus: 5.1 mg/dL — ABNORMAL HIGH (ref 2.5–4.6)
Potassium: 4.6 mmol/L (ref 3.5–5.1)
Sodium: 136 mmol/L (ref 135–145)

## 2017-02-01 LAB — LIPID PANEL
CHOL/HDL RATIO: 3.1 ratio
Cholesterol: 219 mg/dL — ABNORMAL HIGH (ref 0–200)
HDL: 70 mg/dL (ref >40)
LDL Cholesterol: 129 mg/dL — ABNORMAL HIGH (ref 0–99)
Triglycerides: 99 mg/dL (ref <150)
VLDL: 20 mg/dL (ref 0–40)

## 2017-02-01 LAB — VAS US CAROTID
LCCADDIAS: -10 cm/s
LCCADSYS: -73 cm/s
LEFT ECA DIAS: -6 cm/s
LEFT VERTEBRAL DIAS: -5 cm/s
LICADSYS: -87 cm/s
Left CCA prox dias: 9 cm/s
Left CCA prox sys: 74 cm/s
Left ICA dist dias: -18 cm/s
Left ICA prox dias: -20 cm/s
Left ICA prox sys: -100 cm/s
RCCAPSYS: 68 cm/s
RIGHT ECA DIAS: -6 cm/s
RIGHT VERTEBRAL DIAS: -11 cm/s
Right CCA prox dias: 9 cm/s
Right cca dist sys: -77 cm/s

## 2017-02-01 LAB — PREALBUMIN: PREALBUMIN: 27.8 mg/dL (ref 18–38)

## 2017-02-01 MED ORDER — LIDOCAINE-PRILOCAINE 2.5-2.5 % EX CREA
1.0000 "application " | TOPICAL_CREAM | CUTANEOUS | Status: DC | PRN
  Filled 2017-02-01: qty 5

## 2017-02-01 MED ORDER — PENTAFLUOROPROP-TETRAFLUOROETH EX AERO: 1.0000 "application " | INHALATION_SPRAY | CUTANEOUS | Status: DC | PRN

## 2017-02-01 MED ORDER — HEPARIN SODIUM (PORCINE) 1000 UNIT/ML DIALYSIS: 1000.0000 [IU] | INTRAMUSCULAR | Status: DC | PRN

## 2017-02-01 MED ORDER — LIDOCAINE HCL (PF) 1 % IJ SOLN: 5.0000 mL | INTRAMUSCULAR | Status: DC | PRN

## 2017-02-01 MED ORDER — DOXERCALCIFEROL 4 MCG/2ML IV SOLN
3.0000 ug | INTRAVENOUS | Status: DC
  Administered 2017-02-01: 3 ug via INTRAVENOUS

## 2017-02-01 MED ORDER — SODIUM CHLORIDE 0.9 % IV SOLN: 100.0000 mL | INTRAVENOUS | Status: DC | PRN

## 2017-02-01 MED ORDER — LIDOCAINE-PRILOCAINE 2.5-2.5 % EX CREA: 1.0000 "application " | TOPICAL_CREAM | CUTANEOUS | Status: DC | PRN

## 2017-02-01 MED ORDER — HEPARIN SODIUM (PORCINE) 1000 UNIT/ML DIALYSIS
20.0000 [IU]/kg | INTRAMUSCULAR | Status: DC | PRN
  Filled 2017-02-01: qty 2

## 2017-02-01 MED ORDER — NEPRO/CARBSTEADY PO LIQD: 237.0000 mL | ORAL | 12 refills | Status: DC

## 2017-02-01 MED ORDER — NEPRO/CARBSTEADY PO LIQD
237.0000 mL | ORAL | Status: DC
  Filled 2017-02-01: qty 237

## 2017-02-01 MED ORDER — HEPARIN SODIUM (PORCINE) 1000 UNIT/ML DIALYSIS: 20.0000 [IU]/kg | INTRAMUSCULAR | Status: DC | PRN

## 2017-02-01 MED ORDER — DARBEPOETIN ALFA 40 MCG/0.4ML IJ SOSY: 40.0000 ug | PREFILLED_SYRINGE | INTRAMUSCULAR | Status: DC

## 2017-02-01 MED ORDER — HEPARIN SODIUM (PORCINE) 1000 UNIT/ML DIALYSIS
1000.0000 [IU] | INTRAMUSCULAR | Status: DC | PRN
  Filled 2017-02-01: qty 1

## 2017-02-01 MED ORDER — PRO-STAT SUGAR FREE PO LIQD: 30.0000 mL | Freq: Every day | ORAL | 0 refills | Status: DC

## 2017-02-01 MED ORDER — GABAPENTIN 100 MG PO CAPS: 100.0000 mg | ORAL_CAPSULE | Freq: Every day | ORAL | 0 refills | Status: DC

## 2017-02-01 MED ORDER — RESOURCE THICKENUP CLEAR PO POWD
ORAL | Status: DC | PRN
  Filled 2017-02-01: qty 125

## 2017-02-01 MED ORDER — ATORVASTATIN CALCIUM 40 MG PO TABS: 40.0000 mg | ORAL_TABLET | Freq: Every day | ORAL | 0 refills | Status: DC

## 2017-02-01 MED ORDER — ALTEPLASE 2 MG IJ SOLR
2.0000 mg | Freq: Once | INTRAMUSCULAR | Status: DC | PRN
  Filled 2017-02-01: qty 2

## 2017-02-01 MED ORDER — HYDROCERIN EX CREA
TOPICAL_CREAM | Freq: Every day | CUTANEOUS | Status: DC
  Filled 2017-02-01: qty 113

## 2017-02-01 MED ORDER — ALTEPLASE 2 MG IJ SOLR: 2.0000 mg | Freq: Once | INTRAMUSCULAR | Status: DC | PRN

## 2017-02-01 MED ORDER — MUPIROCIN CALCIUM 2 % EX CREA
TOPICAL_CREAM | Freq: Every day | CUTANEOUS | Status: DC
  Filled 2017-02-01: qty 15

## 2017-02-01 MED ORDER — APIXABAN 2.5 MG PO TABS: 2.5000 mg | ORAL_TABLET | Freq: Two times a day (BID) | ORAL | 0 refills | Status: DC

## 2017-02-01 MED ORDER — DOXERCALCIFEROL 4 MCG/2ML IV SOLN
INTRAVENOUS | Status: AC
  Administered 2017-02-01: 3 ug via INTRAVENOUS
  Filled 2017-02-01: qty 2

## 2017-02-01 MED ORDER — PRO-STAT SUGAR FREE PO LIQD: 30.0000 mL | Freq: Every day | ORAL | Status: DC

## 2017-02-01 NOTE — Evaluation (Signed)
Clinical/Bedside Swallow Evaluation Patient Details  Name: Deborah Hendricks MRN: [id number] Date of Birth: 04-25-1946  Today's Date: 02/01/2017 Time: SLP Start Time (ACUTE ONLY): 0841 SLP Stop Time (ACUTE ONLY): 0858 SLP Time Calculation (min) (ACUTE ONLY): 17 min  Past Medical History:  Past Medical History:  Diagnosis Date  . Anemia   . Anxiety   . CHF (congestive heart failure) ([place])   . Complication of anesthesia    "I got real high BP" (11/27/2016)  . Constipation   . Critical lower limb ischemia   . Depression   . ESRD on dialysis [facility])    "TTS; [place]" (11/27/2016)  . GERD (gastroesophageal reflux disease)   . Heart murmur   . High cholesterol   . Hypertension   . Memory loss    since stroke 04/2015  . Peripheral vascular disease ([place])   . Refusal of blood transfusions as patient is Jehovah's Witness   . Seizures ([place]) 04/2015   "when I had a stroke"  . Sleep apnea    "not using mask anymore" (11/27/2016)  . Stroke ([place]) 04/2015   unable to stand .  can have diffuuclty with speech at times; "may not remember qthing since stroke" (11/27/2016)  . Stroke ([place]) 2015  . Type II diabetes mellitus ([place])    "been on insulin since I was 71 years old"   Past Surgical History:  Past Surgical History:  Procedure Laterality Date  . ABDOMINAL HYSTERECTOMY    . AMPUTATION Left 12/15/2015   Procedure: LEFT ABOVE KNEE AMPUTATION;  Surgeon: Serafina Mitchell, MD;  Location: [place];  Service: Vascular;  Laterality: Left;  . BASCILIC VEIN TRANSPOSITION Right 11/25/2015   Procedure: Right Arm FIRST STAGE BASILIC VEIN TRANSPOSITION;  Surgeon: Serafina Mitchell, MD;  Location: [place];  Service: Vascular;  Laterality: Right;  . BASCILIC VEIN TRANSPOSITION Right 01/19/2016   Procedure: RIGHT SECOND STAGE BASILIC VEIN TRANSPOSITION;  Surgeon: Serafina Mitchell, MD;  Location: [place];  Service: Vascular;  Laterality: Right;  . DILATION AND CURETTAGE OF UTERUS    . EYE SURGERY    . PERIPHERAL VASCULAR  CATHETERIZATION Bilateral 11/28/2015   Procedure: Lower Extremity Angiography;  Surgeon: Lorretta Harp, MD;  Location: [place] CV LAB;  Service: Cardiovascular;  Laterality: Bilateral;  . PERIPHERAL VASCULAR CATHETERIZATION N/A 11/28/2015   Procedure: Abdominal Aortogram;  Surgeon: Lorretta Harp, MD;  Location: [place] CV LAB;  Service: Cardiovascular;  Laterality: N/A;  . PERIPHERAL VASCULAR CATHETERIZATION Right 11/27/2016  . PERIPHERAL VASCULAR CATHETERIZATION N/A 11/27/2016   Procedure: Abdominal Aortogram w/Lower Extremity;  Surgeon: Serafina Mitchell, MD;  Location: [place] [facility];  Service: Cardiovascular;  Laterality: N/A;  . PERIPHERAL VASCULAR CATHETERIZATION N/A 11/27/2016   Procedure: Right arm and central venogram;  Surgeon: Serafina Mitchell, MD;  Location: [place] CV LAB;  Service: Cardiovascular;  Laterality: N/A;  . PERIPHERAL VASCULAR CATHETERIZATION Right 11/27/2016   Procedure: Peripheral Vascular Atherectomy;  Surgeon: Serafina Mitchell, MD;  Location: [place] [facility];  Service: Cardiovascular;  Laterality: Right;  Rt    sfa, popliteal, tibioperoneal trunk, peroneal  . PERIPHERAL VASCULAR CATHETERIZATION Right 11/27/2016   Procedure: Peripheral Vascular Balloon Angioplasty;  Surgeon: Serafina Mitchell, MD;  Location: [place] [facility] LAB;  Service: Cardiovascular;  Laterality: Right;  Tibial peroneal  . RETINOPATHY SURGERY Bilateral   . TOE AMPUTATION Right    2nd, 3rd, 4th, 5th digits  . TONSILLECTOMY     HPI:  Deborah Hendricks  an 71 y.o.femalehistory of diabetes mellitus, previous left cerebral infarctions, seizure disorder, hypertension, hyperlipidemia, PAF and ESRD on dialysis, brought to the ED with facial droop and worsening of dysarthria started around 6:00 PM.  According to patient she was in her usual state of health until yesterday evening before going to bed,when she woke up this morning around 7 AM she was feeling generalized malaise and extreme  fatigue,to the point that she was unable to go for her routine dialysis session.She laid in her bed the whole day,when she woke up from a nap around 6 PM,she noticed heaviness in her right face and more than usual difficulty with speech.She called her daughter,who advised her to call EMS.  EMS did notice some dysarthria.  Most recent MRI is negative for acute findings but does show chronic microvascular ischemia and old bilateral basal ganglia infarcts.  Patient has been previously seen by ST 07/27/2016 for a clinical swallowing evaluation with recommendation for a regular diet and thin liquids.     Assessment / Plan / Recommendation Clinical Impression  Clinical swallowing evaluation was completed using thin liquids via spoon, cup and straw, pureed material and dry solids.  Oral mechanism exam was completed showing adequate lingual and labial range of motion and strength but dis-coordination.  Of note, the patient had a clinical swallowing evaluation dated 07/27/2016 with recommendation for a regular diet and thin liquids.  Currently the patient reported intermittent strangling on thin liquids prior to admission.  The patient presented with possible pharyngeal dysphagia characterized by delayed swallow trigger with strong cough given thin liquids via straw sips following presentation of dry solids.  Given this, patient reports and clinical presentation recommend a dysphagia 1 diet with thin liquids via cup sips only pending results of MBS to determine current swallowing physiology.   SLP Visit Diagnosis: Dysphagia, pharyngeal phase (R13.13)    Aspiration Risk  Mild aspiration risk    Diet Recommendation   Dysphagia 1 with thin liquids (no straws) pending MBS.    Medication Administration: Crushed with puree    Other  Recommendations Oral Care Recommendations: Oral care BID             Prognosis Prognosis for Safe Diet Advancement: Good      Swallow Study   General Date of Onset:  01/31/17 HPI: Deborah Hendricks an 71 y.o.femalehistory of diabetes mellitus, previous left cerebral infarctions, seizure disorder, hypertension, hyperlipidemia, PAF and ESRD on dialysis, brought to the ED with facial droop and worsening of dysarthria started around 6:00 PM.  According to patient she was in her usual state of health until yesterday evening before going to bed,when she woke up this morning around 7 AM she was feeling generalized malaise and extreme fatigue,to the point that she was unable to go for her routine dialysis session.She laid in her bed the whole day,when she woke up from a nap around 6 PM,she noticed heaviness in her right face and more than usual difficulty with speech.She called her daughter,who advised her to call EMS.  EMS did notice some dysarthria.  Most recent MRI is negative for acute findings but does show chronic microvascular ischemia and old bilateral basal ganglia infarcts.  Patient has been previously seen by ST 07/27/2016 for a clinical swallowing evaluation with recommendation for a regular diet and thin liquids.   Type of Study: Bedside Swallow Evaluation Previous Swallow Assessment: 07/27/2016 BSE Rx Regular/thin Diet Prior to this Study: NPO Temperature Spikes Noted: No Respiratory Status: Room air History of Recent  Intubation: No Behavior/Cognition: Alert;Cooperative;Pleasant mood Oral Cavity Assessment: Within Functional Limits Oral Care Completed by SLP: No Oral Cavity - Dentition: Missing dentition Vision: Functional for self-feeding Self-Feeding Abilities: Able to feed self Patient Positioning: Upright in bed Baseline Vocal Quality: Normal Volitional Cough: Weak Volitional Swallow: Able to elicit    Oral/Motor/Sensory Function Overall Oral Motor/Sensory Function: Mild impairment (characterized by dis-coordination.) Facial ROM: Within Functional Limits Facial Symmetry: Within Functional Limits Facial Strength: Within Functional  Limits Lingual ROM: Within Functional Limits Lingual Symmetry: Within Functional Limits Lingual Strength: Within Functional Limits Mandible: Within Functional Limits   Ice Chips Ice chips: Within functional limits Presentation: Spoon   Thin Liquid Thin Liquid: Impaired Presentation: Cup;Self Fed;Spoon;Straw Pharyngeal  Phase Impairments: Cough - Immediate (Given straw sips of thin following dry cracker presentation.)    Nectar Thick Nectar Thick Liquid: Not tested   Honey Thick Honey Thick Liquid: Not tested   Puree Puree: Within functional limits Presentation: Spoon;Self Fed   Solid   GO   Solid: Within functional limits Presentation: [place], [place], [place] Acute Rehab SLP [phone] Lamar Sprinkles 02/01/2017,9:12 AM

## 2017-02-01 NOTE — Evaluation (Signed)
Physical Therapy Evaluation Patient Details Name: Deborah Hendricks MRN: [id number] DOB: 04/09/1946 Today's Date: 02/01/2017   History of Present Illness  Pt is a 71 y/o female presented to ED on 01/31/17 with right facial and arm/leg weakness and slurred speech. Pt has a past medical history of Anemia; Anxiety; CHF anesthesia; Constipation; Critical lower limb ischemia; Depression; ESRD on dialysis, Heart murmur; Hypertension; Memory loss; Peripheral vascular disease W L AKA; Refusal of blood transfusions as patient is Jehovah's Witness; Seizures (04/2015); Sleep apnea; Stroke (04/2015 and 2015); Type II diabetes mellitus. MRI found no acute intracranial abnormality and Chronic microvascular ischemia and old bilateral basal ganglia lacunar infarcts.  Clinical Impression  Patient presents close to recent functional baseline, however, was able to utilize prosthetic about 2 months ago, but not since trying to heal R foot wound.  Feel she would benefit from SNF to work intensively on prosthetic training, but currently not with qualifying stay so recommend HHPT and family support as much as possible for safety.     Follow Up Recommendations Home health PT    Equipment Recommendations  None recommended by PT    Recommendations for Other Services       Precautions / Restrictions Precautions Precautions: Fall Precaution Comments: R femoral nontunnelled cath      Mobility  Bed Mobility Overal bed mobility: Needs Assistance Bed Mobility: Supine to Sit     Supine to sit: Supervision;HOB elevated     General bed mobility comments: S for safety  Transfers Overall transfer level: Needs assistance   Transfers: Lateral/Scoot Transfers          Lateral/Scoot Transfers: Min guard General transfer comment: scooting to L to get into recliner  Ambulation/Gait                Stairs            Wheelchair Mobility    Modified Rankin (Stroke Patients Only) Modified Rankin (Stroke  Patients Only) Pre-Morbid Rankin Score: Moderate disability Modified Rankin: Moderately severe disability     Balance Overall balance assessment: Needs assistance Sitting-balance support: No upper extremity supported Sitting balance-Leahy Scale: Good                                       Pertinent Vitals/Pain Pain Assessment: No/denies pain    Home Living Family/patient expects to be discharged to:: Private residence Living Arrangements: Children Available Help at Discharge: Family Type of Home: House Home Access: Ramped entrance     Home Layout: One level Home Equipment: Wheelchair - manual;Hospital bed;Bedside commode      Prior Function Level of Independence: Needs assistance      ADL's / Homemaking Assistance Needed: Pt sponge bathes at sink and gets dressed independently. Grandson and daughter assist with IADLs and provide transportation. Pt also has a PCA 5 days a week 3-8 hours a day        Hand Dominance   Dominant Hand: Left    Extremity/Trunk Assessment   Upper Extremity Assessment Upper Extremity Assessment: Defer to OT evaluation    Lower Extremity Assessment Lower Extremity Assessment: Generalized weakness;RLE deficits/detail;LLE deficits/detail RLE Deficits / Details: R AKA RLE Sensation: history of peripheral neuropathy LLE Deficits / Details: L toe amputations, remaining toe with blister LLE Sensation: history of peripheral neuropathy       Communication   Communication: No difficulties  Cognition Arousal/Alertness: Awake/alert Behavior During Therapy: [facility]  for tasks assessed/performed Overall Cognitive Status: Within Functional Limits for tasks assessed                                        General Comments General comments (skin integrity, edema, etc.): Reports has prosthesis, but not worn for at least 2 months due to R foot wound    Exercises     Assessment/Plan    PT Assessment Patient needs  continued PT services  PT Problem List Decreased balance;Decreased mobility;Decreased knowledge of use of DME;Decreased activity tolerance       PT Treatment Interventions DME instruction;Therapeutic exercise;Therapeutic activities;Patient/family education;Functional mobility training;Wheelchair mobility training    PT Goals (Current goals can be found in the Care Plan section)  Acute Rehab PT Goals Patient Stated Goal: Wants to go to SNF PT Goal Formulation: With patient Time For Goal Achievement: 02/04/17 Potential to Achieve Goals: Good    Frequency Min 3X/week   Barriers to discharge        Co-evaluation               End of Session   Activity Tolerance: Patient tolerated treatment well Patient left: in chair;with call bell/phone within reach;with chair alarm set Nurse Communication: Mobility status PT Visit Diagnosis: Other symptoms and signs involving the nervous system (R29.898)    Time: 1205-1232 PT Time Calculation (min) (ACUTE ONLY): 27 min   Charges:   PT Evaluation $PT Eval Moderate Complexity: 1 Procedure PT Treatments $Therapeutic Activity: 8-22 mins   PT G Codes:   PT G-Codes **NOT FOR INPATIENT CLASS** Functional Assessment Tool Used: AM-PAC 6 Clicks Basic Mobility Functional Limitation: Mobility: Walking and moving around Mobility: Walking and Moving Around Current Status ([id number]): At least 60 percent but less than 80 percent impaired, limited or restricted Mobility: Walking and Moving Around Goal Status [phone]): At least 40 percent but less than 60 percent impaired, limited or restricted    [place], [place] 02/01/2017   Reginia Naas 02/01/2017, 1:25 PM

## 2017-02-01 NOTE — Progress Notes (Signed)
VASCULAR LAB PRELIMINARY  PRELIMINARY  PRELIMINARY  PRELIMINARY  Carotid duplex completed.    Preliminary report:  Right :  60-79% internal carotid artery stenosis.  Vertebral artery flow is antegrade. Left - 1% to 39% ICA stenosis. Vertebral arterial flow is antegrade.  Deborah Hendricks, RVS 02/01/2017, 11:48 AM

## 2017-02-01 NOTE — Progress Notes (Signed)
Deborah Hendricks is a 71 yo female from home to ED via [facility] with slurred speech and facial droop. Pt has history of DM, HTN, stroke, ESRD. She missed dialysis today. NIH 3, CBG 113, LKW 1630. Pt to be admitted for further evaluation to [facility]

## 2017-02-01 NOTE — Progress Notes (Signed)
MD aware that patient has an order for eliquis and patient is NPO at this time.

## 2017-02-01 NOTE — Discharge Summary (Signed)
Name: Deborah Hendricks MRN: [id number] DOB: 1946-04-09 71 y.o. PCP: Max Irean Hong, PA-C  Date of Admission: 01/31/2017  8:21 PM Date of Discharge: 02/01/2017 Attending Physician: Truman Hayward, MD  Discharge Diagnosis: Principal Problem:   TIA (transient ischemic attack) Active Problems:   ESRD on dialysis [facility])  Discharge Medications: Allergies as of 02/01/2017      Reactions   Penicillins Hives, Itching, Rash, Other (See Comments)   Has patient had a PCN reaction causing immediate rash, facial/tongue/throat swelling, SOB or lightheadedness with hypotension: Yes Has patient had a PCN reaction causing severe rash involving mucus membranes or skin necrosis:  Has patient had a PCN reaction that required hospitalization  Has patient had a PCN reaction occurring within the last 10 years:  If all of the above answers are "NO", then may proceed with Cephalosporin use.   Amoxicillin Hives, Itching, Rash   Latex Rash   Sulfa Antibiotics Other (See Comments)   Unknown       Medication List    TAKE these medications   apixaban 2.5 MG Tabs tablet Commonly known as:  ELIQUIS Take 1 tablet (2.5 mg total) by mouth 2 (two) times daily. What changed:  medication strength  how much to take   aspirin EC 81 MG tablet Take 1 tablet (81 mg total) by mouth daily.   atorvastatin 40 MG tablet Commonly known as:  LIPITOR Take 1 tablet (40 mg total) by mouth daily.   calcium acetate 667 MG capsule Commonly known as:  PHOSLO Take 667 mg by mouth 3 (three) times daily with meals.   carvedilol 3.125 MG tablet Commonly known as:  COREG Take 1 tablet (3.125 mg total) by mouth 2 (two) times daily with a meal. What changed:  when to take this  additional instructions   citalopram 10 MG tablet Commonly known as:  CELEXA Take 10 mg by mouth daily.   esomeprazole 20 MG capsule Commonly known as:  NEXIUM Take 20 mg by mouth daily.   fluticasone 50 MCG/ACT nasal spray Commonly  known as:  FLONASE Place 2 sprays into both nostrils daily.   gabapentin 100 MG capsule Commonly known as:  NEURONTIN Take 1 capsule (100 mg total) by mouth at bedtime. What changed:  when to take this   insulin aspart 100 UNIT/ML injection Commonly known as:  novoLOG Inject 2-10 Units into the skin 3 (three) times daily as needed for high blood sugar. 151-200=2 units, 201-250=4 units, 251-300=6 units, 301-350=8 units, 351-400=10 units, >400=10 units and Call MD   multivitamin Tabs tablet Take 1 tablet by mouth daily.   ondansetron 4 MG tablet Commonly known as:  ZOFRAN Take 4 mg by mouth every 8 (eight) hours as needed for nausea or vomiting.       Disposition and follow-up:   Ms.Keena Sales was discharged from [facility] in Stable condition.  At the hospital follow up visit please address:  1.  TIA vs Recrudescence: Have deficits remained at baseline? Assess compliance w/ secondary ppx. ESRD: Consider alternative anticoagulation for Afib given renal disease and address dosing of gabapentin and Eliquis in light of renal impairment.  Follow-up Appointments: Follow-up Information    VINOGRAD, MAX JOSEPH, PA-C Follow up.   Specialty:  Internal Medicine Contact information: [address] [place] [address] [place] Hospital Course by problem list: Principal Problem:   TIA (transient ischemic attack) Active Problems:  ESRD on dialysis ([place])   TIA vs Recrudescence: Patient presented with concerns for worsening dysarthria and facial droop. The patient has a prior history of stroke with pre-existing dysarthria and facial droop deficits. It was thought that these deficits at worsened somewhat on her presentation. She was evaluated for acute stroke with CT and MRI which were negative. Her deficits mostly resolved on the day of discharge w/ dysarthria and facial droop back to baseline. She does have multiple risk  factors. Stroke workup demonstrated carotid dopplers with moderate stenosis on the right ICA, low grade stenosis on the left. Speech pathology evaluated the pt and recommended dysphagia diet for pre-existing deficits of masticatory and pharyngeal muscles. Lipids panel showed mildly elevated total cholesterol w/o prior statin therapy. We have started atorvastatin on discharge and continue ASA 81mg  for secondary prophylaxis.  ESRD on HD (T, Th, S).She missed her dialysis on Thursday as she was not feeling well, electrolytes stable on admission and Nephrology dialyzed her the following day prior to DC. We have would advise consideration of different anticoagulation for her Afib given lack of evidence for Apixiban in ESRD. We have changed her Abixiban dose to renal dosing of 2.5 mg BID on discharge and have also changed her Gabapentin dosing to 100mg  once daily in light of her renal disease and concern of oversedation.  Discharge Vitals:   BP 125/62 (BP Location: Right Arm)   Pulse 66   Temp 97.7 F (36.5 C) (Oral)   Resp 18   Ht 5\' 4"  (1.626 m)   Wt 154 lb 4.8 oz (70 kg)   SpO2 100%   BMI 26.49 kg/m   Pertinent Labs, Studies, and Procedures: Lipid Panel     Component Value Date/Time   CHOL 219 (H) 02/01/2017 0516   TRIG 99 02/01/2017 0516   HDL 70 02/01/2017 0516   CHOLHDL 3.1 02/01/2017 0516   VLDL 20 02/01/2017 0516   LDLCALC 129 (H) 02/01/2017 0516   Procedures Performed:  Mr Brain Wo Contrast Mr Jodene Nam Headm Result Date: 02/01/2017 IMPRESSION: 1. No acute intracranial abnormality. 2. Chronic microvascular ischemia and old bilateral basal ganglia lacunar infarcts.  Ct Head Code Stroke W/o Cm Result Date: 01/31/2017 IMPRESSION: 1. No acute intracranial infarct or other process identified. 2. ASPECTS is 10 3. Generalized cerebral atrophy with chronic microvascular ischemic disease, stable.  Carotid dopplers: Right: 60-79% internal carotid artery stenosis. Left: 1% to 39% ICA  stenosis.  Consultations: Treatment Team:  Md Stroke, MD Roney Jaffe, MD  Discharge Instructions: Discharge Instructions    Call MD for:  difficulty breathing, headache or visual disturbances    Complete by:  As directed    Call MD for:  persistant dizziness or light-headedness    Complete by:  As directed    Call MD for:  persistant nausea and vomiting    Complete by:  As directed    Diet - low sodium heart healthy    Complete by:  As directed    Discharge instructions    Complete by:  As directed    You may have had a "mini-stroke" or you may have had an exacerbation of the symptoms of your old stroke. The CT scan and MRI did not show any new areas of damage. We have started a medication called Lipitor to help reduce the risk of having another stroke. You should also continue to take your other medications as before.  We have also made a small change to 2 of your medications in order  to adjust the doses for your dialysis. We have reduced the Eliquis to 2.5mg  twice daily, and the Gabapentin to 100mg  once daily. Please follow up with your primary doctor regarding these changes.   Increase activity slowly    Complete by:  As directed      Signed: Holley Raring, MD 02/01/2017, 1:11 PM   Pager: [phone]

## 2017-02-01 NOTE — Progress Notes (Signed)
OT Cancellation Note  Patient Details Name: Deborah Hendricks MRN: [id number] DOB: 10/22/1946   Cancelled Treatment:    Reason Eval/Treat Not Completed: Patient at procedure or test/ unavailable. Pt at echocardiogram. Will check back later as time allows.  [place] 02/01/2017, 11:50 AM  Hulda Humphrey OTR/L [phone]

## 2017-02-01 NOTE — Consult Note (Signed)
[facility] Renal Consultation Note    Indication for Consultation:  Management of ESRD/hemodialysis; anemia, hypertension/volume and secondary hyperparathyroidism PCP:  Jana Half, PA-C   HPI: Deborah Hendricks is a 71 y.o. [name] secondary to DM on TTS HD at [facility] with a hx HTN, multiple CVAs, right toe amputations and left AKA, depression, Jehovah's Witness who presented with facial droop, dysarthria, weakness yesterday after having missed dialysis.  She has been on apixaban and aspirin prior to admission.  She is generally compliant with dialysis treatments and last ran 3/20 with a neg UF of 1 L post wt 69 (EDW 68).  Pre HD BP was 157/97 and post was 99/54.  Her dialysis catheter was replaced 3/14 due to poor functioning. The patient is generally independent in ADLs and can do her own transfers and feed herself. She lives with her daughter, grandson and his fiance and their new baby and 3 animals.  She feels like an imposition to her daughter.  They have arguments at times and yesterday said she called 911 because her daughter wasn't doing anything for her. (Her daughter was at home at the time).  Her daughter has not visited her in the hospital although she did call today. Today, she tells me "they think I am having mini-strokes and I am having a lot of tests." She has no SOB, CP, N, V, D.  She feels about the same as usual. The staff here has braided her hair.  Her nails are done by her caregiver that comes to the house.  Past Medical History:  Diagnosis Date  . Anemia   . Anxiety   . CHF (congestive heart failure) ([place])   . Complication of anesthesia    "I got real high BP" (11/27/2016)  . Constipation   . Critical lower limb ischemia   . Depression   . ESRD on dialysis [facility])    "TTS; [place]" (11/27/2016)  . GERD (gastroesophageal reflux disease)   . Heart murmur   . High cholesterol   . Hypertension   . Memory loss    since stroke 04/2015  . Peripheral  vascular disease ([place])   . Refusal of blood transfusions as patient is Jehovah's Witness   . Seizures ([place]) 04/2015   "when I had a stroke"  . Sleep apnea    "not using mask anymore" (11/27/2016)  . Stroke ([place]) 04/2015   unable to stand .  can have diffuuclty with speech at times; "may not remember qthing since stroke" (11/27/2016)  . Stroke ([place]) 2015  . Type II diabetes mellitus ([place])    "been on insulin since I was 71 years old"   Past Surgical History:  Procedure Laterality Date  . ABDOMINAL HYSTERECTOMY    . AMPUTATION Left 12/15/2015   Procedure: LEFT ABOVE KNEE AMPUTATION;  Surgeon: Serafina Mitchell, MD;  Location: [place];  Service: Vascular;  Laterality: Left;  . BASCILIC VEIN TRANSPOSITION Right 11/25/2015   Procedure: Right Arm FIRST STAGE BASILIC VEIN TRANSPOSITION;  Surgeon: Serafina Mitchell, MD;  Location: [place];  Service: Vascular;  Laterality: Right;  . BASCILIC VEIN TRANSPOSITION Right 01/19/2016   Procedure: RIGHT SECOND STAGE BASILIC VEIN TRANSPOSITION;  Surgeon: Serafina Mitchell, MD;  Location: [place];  Service: Vascular;  Laterality: Right;  . DILATION AND CURETTAGE OF UTERUS    . EYE SURGERY    . PERIPHERAL VASCULAR CATHETERIZATION Bilateral 11/28/2015   Procedure: Lower Extremity Angiography;  Surgeon: Lorretta Harp, MD;  Location: [place] CV LAB;  Service: Cardiovascular;  Laterality: Bilateral;  . PERIPHERAL VASCULAR CATHETERIZATION N/A 11/28/2015   Procedure: Abdominal Aortogram;  Surgeon: Lorretta Harp, MD;  Location: [place] CV LAB;  Service: Cardiovascular;  Laterality: N/A;  . PERIPHERAL VASCULAR CATHETERIZATION Right 11/27/2016  . PERIPHERAL VASCULAR CATHETERIZATION N/A 11/27/2016   Procedure: Abdominal Aortogram w/Lower Extremity;  Surgeon: Serafina Mitchell, MD;  Location: [place] [facility];  Service: Cardiovascular;  Laterality: N/A;  . PERIPHERAL VASCULAR CATHETERIZATION N/A 11/27/2016   Procedure: Right arm and central venogram;  Surgeon: Serafina Mitchell,  MD;  Location: [place] CV LAB;  Service: Cardiovascular;  Laterality: N/A;  . PERIPHERAL VASCULAR CATHETERIZATION Right 11/27/2016   Procedure: Peripheral Vascular Atherectomy;  Surgeon: Serafina Mitchell, MD;  Location: [place] CV LAB;  Service: Cardiovascular;  Laterality: Right;  Rt    sfa, popliteal, tibioperoneal trunk, peroneal  . PERIPHERAL VASCULAR CATHETERIZATION Right 11/27/2016   Procedure: Peripheral Vascular Balloon Angioplasty;  Surgeon: Serafina Mitchell, MD;  Location: [place] CV LAB;  Service: Cardiovascular;  Laterality: Right;  Tibial peroneal  . RETINOPATHY SURGERY Bilateral   . TOE AMPUTATION Right    2nd, 3rd, 4th, 5th digits  . TONSILLECTOMY     Family History  Problem Relation Age of Onset  . Asthma Mother   . Cancer Sister    Social History:  reports that she quit smoking about 21 months ago. Her smoking use included Cigarettes. She has a 12.75 pack-year smoking history. She has never used smokeless tobacco. She reports that she drinks alcohol. She reports that she does not use drugs. Allergies  Allergen Reactions  . Penicillins Hives, Itching, Rash and Other (See Comments)    Has patient had a PCN reaction causing immediate rash, facial/tongue/throat swelling, SOB or lightheadedness with hypotension: Yes Has patient had a PCN reaction causing severe rash involving mucus membranes or skin necrosis:  Has patient had a PCN reaction that required hospitalization  Has patient had a PCN reaction occurring within the last 10 years:  If all of the above answers are "NO", then may proceed with Cephalosporin use.  [name] Amoxicillin Hives, Itching and Rash  . Latex Rash  . Sulfa Antibiotics Other (See Comments)    Unknown    Prior to Admission medications   Medication Sig Start Date End Date Taking? Authorizing Provider  apixaban (ELIQUIS) 5 MG TABS tablet Take 5 mg by mouth 2 (two) times daily.    Yes Historical Provider, MD  aspirin EC 81 MG tablet Take 1 tablet  (81 mg total) by mouth daily. 07/30/16  Yes Reyne Dumas, MD  calcium acetate (PHOSLO) 667 MG capsule Take 667 mg by mouth 3 (three) times daily with meals.   Yes Historical Provider, MD  carvedilol (COREG) 3.125 MG tablet Take 1 tablet (3.125 mg total) by mouth 2 (two) times daily with a meal. Patient taking differently: Take 3.125 mg by mouth See admin instructions. Take 3.125 mg by mouth daily on Tuesday, Thursday and Saturday after dialysis. Take 3.125 mg by mouth twice daily on all other days 07/30/16  Yes Reyne Dumas, MD  citalopram (CELEXA) 10 MG tablet Take 10 mg by mouth daily. 09/27/16  Yes Historical Provider, MD  esomeprazole (NEXIUM) 20 MG capsule Take 20 mg by mouth daily. 10/31/16  Yes Historical Provider, MD  fluticasone (FLONASE) 50 MCG/ACT nasal spray Place 2 sprays into both nostrils daily.   Yes Historical Provider, MD  gabapentin (NEURONTIN) 100 MG capsule  Take 100 mg by mouth 3 (three) times daily.   Yes Historical Provider, MD  insulin aspart (NOVOLOG) 100 UNIT/ML injection Inject 2-10 Units into the skin 3 (three) times daily as needed for high blood sugar. 151-200=2 units, 201-250=4 units, 251-300=6 units, 301-350=8 units, 351-400=10 units, >400=10 units and Call MD   Yes Historical Provider, MD  multivitamin (RENA-VIT) TABS tablet Take 1 tablet by mouth daily.   Yes Historical Provider, MD  ondansetron (ZOFRAN) 4 MG tablet Take 4 mg by mouth every 8 (eight) hours as needed for nausea or vomiting.    Historical Provider, MD   Current Facility-Administered Medications  Medication Dose Route Frequency Provider Last Rate Last Dose  . 0.9 %  sodium chloride infusion  250 mL Intravenous PRN Lorella Nimrod, MD      . acetaminophen (TYLENOL) tablet 650 mg  650 mg Oral Q6H PRN Lorella Nimrod, MD       Or  . acetaminophen (TYLENOL) suppository 650 mg  650 mg Rectal Q6H PRN Lorella Nimrod, MD      . apixaban (ELIQUIS) tablet 5 mg  5 mg Oral BID Lorella Nimrod, MD      . aspirin EC tablet 81  mg  81 mg Oral Daily Lorella Nimrod, MD      . calcium acetate (PHOSLO) capsule 667 mg  667 mg Oral TID WC Lorella Nimrod, MD      . citalopram (CELEXA) tablet 10 mg  10 mg Oral Daily Lorella Nimrod, MD      . Derrill Memo ON 02/02/2017] Darbepoetin Alfa (ARANESP) injection 40 mcg  40 mcg Intravenous Q Sat-HD Alric Seton, PA-C      . Derrill Memo ON 02/02/2017] doxercalciferol (HECTOROL) injection 3 mcg  3 mcg Intravenous Q T,Th,Sa-HD Alric Seton, PA-C      . gabapentin (NEURONTIN) capsule 100 mg  100 mg Oral Daily PRN Lorella Nimrod, MD      . hydrocerin (EUCERIN) cream   Topical Daily Truman Hayward, MD      . insulin aspart (novoLOG) injection 0-5 Units  0-5 Units Subcutaneous QHS Lorella Nimrod, MD      . insulin aspart (novoLOG) injection 0-9 Units  0-9 Units Subcutaneous TID WC Lorella Nimrod, MD      . multivitamin (RENA-VIT) tablet 1 tablet  1 tablet Oral QHS Lorella Nimrod, MD      . mupirocin cream (BACTROBAN) 2 %   Topical Daily Truman Hayward, MD      . pantoprazole (PROTONIX) EC tablet 40 mg  40 mg Oral Daily Lorella Nimrod, MD      . polyethylene glycol (MIRALAX / GLYCOLAX) packet 17 g  17 g Oral Daily PRN Lorella Nimrod, MD      . RESOURCE THICKENUP CLEAR   Oral PRN Truman Hayward, MD      . sodium chloride flush (NS) 0.9 % injection 3 mL  3 mL Intravenous Q12H Lorella Nimrod, MD      . sodium chloride flush (NS) 0.9 % injection 3 mL  3 mL Intravenous Q12H Lorella Nimrod, MD   3 mL at 02/01/17 0243  . sodium chloride flush (NS) 0.9 % injection 3 mL  3 mL Intravenous PRN Lorella Nimrod, MD       Labs: Basic Metabolic Panel:  Recent Labs Lab 01/31/17 2024 01/31/17 2032 02/01/17 0516  NA 135 134* 136  K 5.0 4.9 4.6  CL 97* 100* 97*  CO2 25  --  26  GLUCOSE 123* 121*  88  BUN 59* 54* 62*  CREATININE 6.87* 6.80* 6.98*  CALCIUM 9.1  --  8.8*  PHOS  --   --  5.1*   Liver Function Tests:  Recent Labs Lab 01/31/17 2024 02/01/17 0516  AST 17  --   ALT 17  --   ALKPHOS 128*  --    BILITOT 0.6  --   PROT 6.4*  --   ALBUMIN 3.2* 2.9*  CBC:  Recent Labs Lab 01/31/17 2024 01/31/17 2032 02/01/17 0516  WBC 6.9  --  7.1  NEUTROABS 3.7  --   --   HGB 9.0* 9.5* 9.5*  HCT 28.5* 28.0* 29.9*  MCV 91.6  --  91.7  PLT 193  --  201  CBG:  Recent Labs Lab 01/31/17 2024 02/01/17 0236 02/01/17 0647  GLUCAP 113* 102* 75   Studies/Results: Mr Brain Wo Contrast  Result Date: 02/01/2017 CLINICAL DATA:  Facial droop and worsening dysarthria. EXAM: MRI HEAD WITHOUT CONTRAST MRA HEAD WITHOUT CONTRAST TECHNIQUE: Multiplanar, multiecho pulse sequences of the brain and surrounding structures were obtained without intravenous contrast. Angiographic images of the head were obtained using MRA technique without contrast. COMPARISON:  Head CT 01/31/2017 Brain MRI 07/29/2016 FINDINGS: MRI HEAD FINDINGS Brain: No focal diffusion restriction to indicate acute infarct. No intraparenchymal hemorrhage. Old bilateral basal ganglia lacunar infarcts. There is beginning confluent hyperintense T2-weighted signal within the periventricular and deep white matter, most often seen in the setting of chronic microvascular ischemia. No mass lesion or midline shift. No hydrocephalus or extra-axial fluid collection. The midline structures are normal. No age advanced or lobar predominant atrophy. Vascular: Major intracranial arterial and venous sinus flow voids are preserved. No evidence of chronic microhemorrhage or amyloid angiopathy. Skull and upper cervical spine: The visualized skull base, calvarium, upper cervical spine and extracranial soft tissues are normal. Sinuses/Orbits: No fluid levels or advanced mucosal thickening. No mastoid effusion. Normal orbits. MRA HEAD FINDINGS Intracranial internal carotid arteries: Normal. Anterior cerebral arteries: Normal. Middle cerebral arteries: Normal. Posterior communicating arteries: Present bilaterally. Posterior cerebral arteries: Normal. Basilar artery: Normal.  Vertebral arteries: Left dominant. Normal. Superior cerebellar arteries: Normal. Anterior inferior cerebellar arteries: Normal. Posterior inferior cerebellar arteries: Normal. IMPRESSION: 1. No acute intracranial abnormality. 2. Chronic microvascular ischemia and old bilateral basal ganglia lacunar infarcts. Electronically Signed   By: Ulyses Jarred M.D.   On: 02/01/2017 04:29   Dg Swallowing Func-speech Pathology  Result Date: 02/01/2017 Objective Swallowing Evaluation: Type of Study: MBS-Modified Barium Swallow Study Patient Details Name: Deborah Hendricks MRN: [id number] Date of Birth: [birth year] Today's Date: 02/01/2017 Time: SLP Start Time (ACUTE ONLY): 0957-SLP Stop Time (ACUTE ONLY): 1019 SLP Time Calculation (min) (ACUTE ONLY): 22 min Past Medical History: Past Medical History: Diagnosis Date . Anemia  . Anxiety  . CHF (congestive heart failure) ([place])  . Complication of anesthesia   "I got real high BP" (11/27/2016) . Constipation  . Critical lower limb ischemia  . Depression  . ESRD on dialysis [facility])   "TTS; [place]" (11/27/2016) . GERD (gastroesophageal reflux disease)  . Heart murmur  . High cholesterol  . Hypertension  . Memory loss   since stroke 04/2015 . Peripheral vascular disease ([place])  . Refusal of blood transfusions as patient is Jehovah's Witness  . Seizures ([place]) 04/2015  "when I had a stroke" . Sleep apnea   "not using mask anymore" (11/27/2016) . Stroke ([place]) 04/2015  unable to stand .  can have diffuuclty with speech at times; "may not remember  qthing since stroke" (11/27/2016) . Stroke ([place]) 2015 . Type II diabetes mellitus ([place])   "been on insulin since I was 71 years old" Past Surgical History: Past Surgical History: Procedure Laterality Date . ABDOMINAL HYSTERECTOMY   . AMPUTATION Left 12/15/2015  Procedure: LEFT ABOVE KNEE AMPUTATION;  Surgeon: Serafina Mitchell, MD;  Location: [place];  Service: Vascular;  Laterality: Left; . BASCILIC VEIN TRANSPOSITION Right 11/25/2015  Procedure: Right Arm FIRST  STAGE BASILIC VEIN TRANSPOSITION;  Surgeon: Serafina Mitchell, MD;  Location: [place];  Service: Vascular;  Laterality: Right; . BASCILIC VEIN TRANSPOSITION Right 01/19/2016  Procedure: RIGHT SECOND STAGE BASILIC VEIN TRANSPOSITION;  Surgeon: Serafina Mitchell, MD;  Location: [place];  Service: Vascular;  Laterality: Right; . DILATION AND CURETTAGE OF UTERUS   . EYE SURGERY   . PERIPHERAL VASCULAR CATHETERIZATION Bilateral 11/28/2015  Procedure: Lower Extremity Angiography;  Surgeon: Lorretta Harp, MD;  Location: [place] CV LAB;  Service: Cardiovascular;  Laterality: Bilateral; . PERIPHERAL VASCULAR CATHETERIZATION N/A 11/28/2015  Procedure: Abdominal Aortogram;  Surgeon: Lorretta Harp, MD;  Location: [place] [facility];  Service: Cardiovascular;  Laterality: N/A; . PERIPHERAL VASCULAR CATHETERIZATION Right 11/27/2016 . PERIPHERAL VASCULAR CATHETERIZATION N/A 11/27/2016  Procedure: Abdominal Aortogram w/Lower Extremity;  Surgeon: Serafina Mitchell, MD;  Location: [place] CV LAB;  Service: Cardiovascular;  Laterality: N/A; . PERIPHERAL VASCULAR CATHETERIZATION N/A 11/27/2016  Procedure: Right arm and central venogram;  Surgeon: Serafina Mitchell, MD;  Location: [place] [facility] LAB;  Service: Cardiovascular;  Laterality: N/A; . PERIPHERAL VASCULAR CATHETERIZATION Right 11/27/2016  Procedure: Peripheral Vascular Atherectomy;  Surgeon: Serafina Mitchell, MD;  Location: [place] [facility] LAB;  Service: Cardiovascular;  Laterality: Right;  Rt    sfa, popliteal, tibioperoneal trunk, peroneal . PERIPHERAL VASCULAR CATHETERIZATION Right 11/27/2016  Procedure: Peripheral Vascular Balloon Angioplasty;  Surgeon: Serafina Mitchell, MD;  Location: [place] [facility] LAB;  Service: Cardiovascular;  Laterality: Right;  Tibial peroneal . RETINOPATHY SURGERY Bilateral  . TOE AMPUTATION Right   2nd, 3rd, 4th, 5th digits . TONSILLECTOMY   HPI: Deborah Hendricks an 71 y.o.femalehistory of diabetes mellitus, previous left cerebral infarctions, seizure  disorder, hypertension, hyperlipidemia, PAF and ESRD on dialysis, brought to the ED with facial droop and worsening of dysarthria started around 6:00 PM.  According to patient she was in her usual state of health until yesterday evening before going to bed,when she woke up this morning around 7 AM she was feeling generalized malaise and extreme fatigue,to the point that she was unable to go for her routine dialysis session.She laid in her bed the whole day,when she woke up from a nap around 6 PM,she noticed heaviness in her right face and more than usual difficulty with speech.She called her daughter,who advised her to call EMS.  EMS did notice some dysarthria.  Most recent MRI is negative for acute findings but does show chronic microvascular ischemia and old bilateral basal ganglia infarcts.  Patient has been previously seen by ST 07/27/2016 for a clinical swallowing evaluation with recommendation for a regular diet and thin liquids.   Subjective: The patient was seen in radiology for MBS to determine current swallowing physiology.  Assessment / Plan / Recommendation CHL IP CLINICAL IMPRESSIONS 02/01/2017 Clinical Impression MBS was completed using thin liquids, nectar thick liquids, pureed material and dual textured solids.  The patient presented with mild oropharyngeal dysphagia characterized by a mild delay in oral transit and mild tongue pumping noted given pureed and dual textured material  as well as a delayed swallow trigger which lead to silent penetration prior to the swallow given thin liquids via spoon and cup sips despite the use of a chin tuck.  Esophageal sweep revealed it VERY slow to clear.  Patient appeared to have full column stacking given small amounts of intake.  Usual strategies to facilitate clearance were not successful.  Suspect patient may clear to gravity.  May want to consider GI work up.  Recommend a regular diet with nectar thick liquids.  Patient should alternate food/liquids  and sit up for at least an hour after any intake.  ST to follow up for education, therapeutic diet tolerance and to initiate education.  Patient will require ST at next level of care to address dysphagia.   SLP Visit Diagnosis Dysphagia, oropharyngeal phase (R13.12) Attention and concentration deficit following -- Frontal lobe and executive function deficit following -- Impact on safety and function Mild aspiration risk   CHL IP TREATMENT RECOMMENDATION 02/01/2017 Treatment Recommendations Therapy as outlined in treatment plan below   Prognosis 02/01/2017 Prognosis for Safe Diet Advancement Fair Barriers to Reach Goals -- Barriers/Prognosis Comment -- CHL IP DIET RECOMMENDATION 02/01/2017 SLP Diet Recommendations Nectar thick liquid;Regular solids Liquid Administration via Cup Medication Administration Crushed with puree Compensations Slow rate;Small sips/bites;Follow solids with liquid Postural Changes Seated upright at 90 degrees;Remain semi-upright after after feeds/meals (Comment)   CHL IP OTHER RECOMMENDATIONS 02/01/2017 Recommended Consults Consider GI evaluation Oral Care Recommendations Oral care BID Other Recommendations Order thickener from pharmacy;Prohibited food (jello, ice cream, thin soups)   CHL IP FOLLOW UP RECOMMENDATIONS 02/01/2017 Follow up Recommendations Other (comment)   CHL IP FREQUENCY AND DURATION 02/01/2017 Speech Therapy Frequency (ACUTE ONLY) min 2x/week Treatment Duration 2 weeks      CHL IP ORAL PHASE 02/01/2017 Oral Phase WFL Oral - Pudding Teaspoon -- Oral - Pudding Cup -- Oral - Honey Teaspoon -- Oral - Honey Cup -- Oral - Nectar Teaspoon -- Oral - Nectar Cup -- Oral - Nectar Straw -- Oral - Thin Teaspoon -- Oral - Thin Cup -- Oral - Thin Straw -- Oral - Puree -- Oral - Mech Soft -- Oral - Regular -- Oral - Multi-Consistency -- Oral - Pill -- Oral Phase - Comment --  CHL IP PHARYNGEAL PHASE 02/01/2017 Pharyngeal Phase Impaired Pharyngeal- Pudding Teaspoon -- Pharyngeal -- Pharyngeal-  Pudding Cup -- Pharyngeal -- Pharyngeal- Honey Teaspoon -- Pharyngeal -- Pharyngeal- Honey Cup -- Pharyngeal -- Pharyngeal- Nectar Teaspoon Delayed swallow initiation-pyriform sinuses Pharyngeal -- Pharyngeal- Nectar Cup Delayed swallow initiation-pyriform sinuses Pharyngeal -- Pharyngeal- Nectar Straw -- Pharyngeal -- Pharyngeal- Thin Teaspoon Delayed swallow initiation-pyriform sinuses;Penetration/Aspiration before swallow Pharyngeal Material enters airway, remains ABOVE vocal cords and not ejected out Pharyngeal- Thin Cup Delayed swallow initiation-pyriform sinuses;Penetration/Aspiration before swallow Pharyngeal Material enters airway, remains ABOVE vocal cords and not ejected out Pharyngeal- Thin Straw -- Pharyngeal -- Pharyngeal- Puree Delayed swallow initiation-vallecula Pharyngeal -- Pharyngeal- Mechanical Soft -- Pharyngeal -- Pharyngeal- Regular -- Pharyngeal -- Pharyngeal- Multi-consistency Delayed swallow initiation-vallecula Pharyngeal -- Pharyngeal- Pill -- Pharyngeal -- Pharyngeal Comment --  CHL IP CERVICAL ESOPHAGEAL PHASE 02/01/2017 Cervical Esophageal Phase WFL Pudding Teaspoon -- Pudding Cup -- Honey Teaspoon -- Honey Cup -- Nectar Teaspoon -- Nectar Cup -- Nectar Straw -- Thin Teaspoon -- Thin Cup -- Thin Straw -- Puree -- Mechanical Soft -- Regular -- Multi-consistency -- Pill -- Cervical Esophageal Comment -- CHL IP GO 02/01/2017 Functional Assessment Tool Used Clinical judgment and ASHA NOMS Functional Limitations Swallowing Swallow Current Status (  J8841) CJ Swallow Goal Status (Y6063) [place] Swallow Discharge Status [phone]) (None) Motor Speech Current Status [phone]) (None) Motor Speech Goal Status [phone]) (None) Motor Speech Goal Status [phone]) (None) Spoken Language Comprehension Current Status [phone]) (None) Spoken Language Comprehension Goal Status (B7628) (None) Spoken Language Comprehension Discharge Status [phone]) (None) Spoken Language Expression Current Status [phone]) (None) Spoken  Language Expression Goal Status (P7106) (None) Spoken Language Expression Discharge Status [phone]) (None) Attention Current Status (N4627) (None) Attention Goal Status (O3500) (None) Attention Discharge Status [phone]) (None) Memory Current Status (E9937) (None) Memory Goal Status (J6967) (None) Memory Discharge Status (E9381) (None) Voice Current Status (O1751) (None) Voice Goal Status (W2585) (None) Voice Discharge Status (I7782) (None) Other Speech-Language Pathology Functional Limitation Current Status (U2353) (None) Other Speech-Language Pathology Functional Limitation Goal Status (I1443) (None) Other Speech-Language Pathology Functional Limitation Discharge Status [phone]) (None) Shelly Flatten, MA, CCC-SLP Acute Rehab SLP [phone] Lamar Sprinkles 02/01/2017, 10:31 AM              Mr Jodene Nam Headm  Result Date: 02/01/2017 CLINICAL DATA:  Facial droop and worsening dysarthria. EXAM: MRI HEAD WITHOUT CONTRAST MRA HEAD WITHOUT CONTRAST TECHNIQUE: Multiplanar, multiecho pulse sequences of the brain and surrounding structures were obtained without intravenous contrast. Angiographic images of the head were obtained using MRA technique without contrast. COMPARISON:  Head CT 01/31/2017 Brain MRI 07/29/2016 FINDINGS: MRI HEAD FINDINGS Brain: No focal diffusion restriction to indicate acute infarct. No intraparenchymal hemorrhage. Old bilateral basal ganglia lacunar infarcts. There is beginning confluent hyperintense T2-weighted signal within the periventricular and deep white matter, most often seen in the setting of chronic microvascular ischemia. No mass lesion or midline shift. No hydrocephalus or extra-axial fluid collection. The midline structures are normal. No age advanced or lobar predominant atrophy. Vascular: Major intracranial arterial and venous sinus flow voids are preserved. No evidence of chronic microhemorrhage or amyloid angiopathy. Skull and upper cervical spine: The visualized skull base, calvarium,  upper cervical spine and extracranial soft tissues are normal. Sinuses/Orbits: No fluid levels or advanced mucosal thickening. No mastoid effusion. Normal orbits. MRA HEAD FINDINGS Intracranial internal carotid arteries: Normal. Anterior cerebral arteries: Normal. Middle cerebral arteries: Normal. Posterior communicating arteries: Present bilaterally. Posterior cerebral arteries: Normal. Basilar artery: Normal. Vertebral arteries: Left dominant. Normal. Superior cerebellar arteries: Normal. Anterior inferior cerebellar arteries: Normal. Posterior inferior cerebellar arteries: Normal. IMPRESSION: 1. No acute intracranial abnormality. 2. Chronic microvascular ischemia and old bilateral basal ganglia lacunar infarcts. Electronically Signed   By: Ulyses Jarred M.D.   On: 02/01/2017 04:29   Ct Head Code Stroke W/o Cm  Result Date: 01/31/2017 CLINICAL DATA:  Code stroke. Generalized cerebral atrophy with mild chronic microvascular disease. EXAM: CT HEAD WITHOUT CONTRAST TECHNIQUE: Contiguous axial images were obtained from the base of the skull through the vertex without intravenous contrast. COMPARISON:  Prior MRI from 07/29/2016. FINDINGS: Brain: Generalized cerebral atrophy with mild chronic microvascular ischemic disease. No acute intracranial hemorrhage. No findings to suggest acute large vessel territory infarct. No mass lesion, midline shift, or mass effect. No hydrocephalus. No extra-axial fluid collection. Vascular: No hyperdense vessel. Scattered vascular calcifications noted within the carotid siphons. Skull: Scalp soft tissues within normal limits.  Calvarium intact. Sinuses/Orbits: Globes and orbital soft tissues within normal limits. Patient is status post lens extraction bilaterally. Paranasal sinuses and mastoid air cells are clear. Other: None. ASPECTS [facility] Stroke Program Early CT Score) - Ganglionic level infarction (caudate, lentiform nuclei, internal capsule, insula, M1-M3 cortex): 7 -  Supraganglionic infarction (M4-M6 cortex): 3 Total  score (0-10 with 10 being normal): 10 IMPRESSION: 1. No acute intracranial infarct or other process identified. 2. ASPECTS is 10 3. Generalized cerebral atrophy with chronic microvascular ischemic disease, stable. Critical Value/emergent results were called by telephone at the time of interpretation on 01/31/2017 at 9:07 pm to Dr. Nicole Kindred, who verbally acknowledged these results. Electronically Signed   By: Jeannine Boga M.D.   On: 01/31/2017 21:09    ROS: As per HPI otherwise negative.  Physical Exam: Vitals:   02/01/17 0100 02/01/17 0300 02/01/17 0500 02/01/17 0656  BP: (!) 143/99 (!) 166/72 (!) 175/67 125/62  Pulse: (!) 125 80 66 66  Resp: 20 18 18 18   Temp: 97.9 F (36.6 C)  97.7 F (36.5 C)   TempSrc: Oral  Oral   SpO2: 99% 97% 97% 100%  Weight: 70 kg (154 lb 4.8 oz)     Height: 5\' 4"  (1.626 m)        General: WDWN NAD  Head: NCAT sclera not icteric MMM Neck: Supple no JVD Lungs: CTA bilaterally without wheezes, rales, or rhonchi. Breathing is unlabored. Heart: RRR 2/6 murmur Abdomen: soft NT + BS Lower extremities: right great toe- dry s/p all other toe amp - left AKA - no edema Neuro: cognitively baseline - dysarthria - seems baseline Psych:  Responds to questions appropriately with a normal affect; recognizes me Dialysis Access: right fem cath  Dialysis Orders: TTS - 4.25 hr 160 400/800 EDW 68 2 K 2 Ca profile 4 var Na left thigh TDC heparin 5K Hectorol 3 Mircera 30 q 4 weeks - 3/17  Recent labs: hgb 9.2 3/15 26^ sat 2/22 ferritin 1295 Jan iPTH 196  Assessment/Plan: 1. ?TIA - hx of multiple CVAx- no acute findings on MRI/neuro following  2. ESRD -  TTS - missed HD Thursday - 3 hours today and back on schedule Sat 3. Hypertension/volume  -has been on coreg 3.125 bid  -curretnly on hold- avoid BP drop 4. Anemia  - hgb 9.5 - redose ESA Saturday 5. Metabolic bone disease -  Continue Hectorol and binders 6. Nutrition  -regular diet nectar thick per ST should alternate food and liquids and set up at least an hour after intake (she should minimize eating on dialysis at the out patient HD unit due to risk of needing to be put in Trendelenburg ) 7. PVD - chronic right great toe ulcer- followed by wound care - last seen 2/28- stable 8. DM - per primary 9. Depression on celexa 10 10. DNR - she is a full code at her dialysis unit; this needs to be changed at discharge; she also needs an updated outpt med list;  11. Afib - on asa, apixaban - low dose coreg 12. Home situation - some degree of conflict with daughter; hard to know reality of the situation.  Myriam Jacobson, PA-C [place] [phone] 02/01/2017, 11:35 AM   Pt seen, examined and agree w A/P as above. D/W primary MD, their plan is for dc home after HD today.  No clear evidence of new stroke.  Hx old CVA's.  On HD now, tolerating well.   Kelly Splinter MD [employer] pager [phone]   02/01/2017, 3:10 PM

## 2017-02-01 NOTE — Consult Note (Signed)
[place] Nurse wound consult note Reason for Consult: Consult requested for right great toe chronic wound.  Pt states it began as a callous Wound type:  Partial thickness wound to plantar surface of great toe; .8X.8X.1cm Wound bed: dark red and dry Drainage (amount, consistency, odor) no odor or drainage Periwound: Intact skin surrounding, dry skin to right outer foot where previous toe amputations have intact scar tissue Dressing procedure/placement/frequency: Eucerin cream to right foot to decrase dry skin and itching.  Bactroban and foam dressing to promote healing to right great toe wound. Please re-consult if further assistance is needed.  Thank-you,  Julien Girt MSN, [place], [place], [place], [place]

## 2017-02-01 NOTE — Progress Notes (Signed)
Patient left unit for MRI. 

## 2017-02-01 NOTE — Care Management Note (Signed)
Case Management Note  Patient Details  Name: Deborah Hendricks MRN: [id number] Date of Birth: 12/05/1945  Subjective/Objective:                    Action/Plan: Pt discharging home with [facility] services. CM went by room and pt in dialysis. CM called pts daughter [name] and inquired about [place] services. Per Deana pt is active with [facility] for Aide services so they would like to use [facility] for other services. Cory with [facility] notified and accepted the referral. Daughter asked for information on a new PCP. CM provided in the d/c pack the number for [facility] and some Health care groups name and numbers that are accepting new patients.  Daughter or Yolanda Bonine to provide transportation home.   Expected Discharge Date:  02/01/17               Expected Discharge Plan:  [place]  In-House Referral:     Discharge planning Services  CM Consult  Post Acute Care Choice:  Home Health Choice offered to:  Adult Children  DME Arranged:    DME Agency:     HH Arranged:  PT, OT, Nurse's Aide (per daughter  pt already has an Engineer, production at home) [place]:  [place]  Status of Service:  Completed, signed off  If discussed at [place] of Stay Meetings, dates discussed:    Additional Comments:  Pollie Friar, RN 02/01/2017, 6:32 PM

## 2017-02-01 NOTE — Progress Notes (Signed)
Subjective: Currently, the patient is feeling much improved. Reports that dysarthria is back to baseline. Feels agreeable to go home. Reports difficulties at home living with her daughter, but notes that she does feel very safe. Would like recommendations about potential SNF options.  Interval Events: MRI negative for acute CVA.  Objective: Vital signs in last 24 hours: Vitals:   02/01/17 0100 02/01/17 0300 02/01/17 0500 02/01/17 0656  BP: (!) 143/99 (!) 166/72 (!) 175/67 125/62  Pulse: (!) 125 80 66 66  Resp: 20 18 18 18   Temp: 97.9 F (36.6 C)  97.7 F (36.5 C)   TempSrc: Oral  Oral   SpO2: 99% 97% 97% 100%  Weight: 154 lb 4.8 oz (70 kg)     Height: 5\' 4"  (1.626 m)      Physical Exam: Physical Exam  Constitutional: She is oriented to person, place, and time. She appears well-developed. She is cooperative. No distress.  Cardiovascular: Normal rate, regular rhythm and normal pulses.  Exam reveals no gallop.   Murmur heard.  Systolic murmur is present  Pulmonary/Chest: Effort normal and breath sounds normal. No respiratory distress. Breasts are symmetrical.  Abdominal: Soft. Bowel sounds are normal. There is no tenderness.  Musculoskeletal: She exhibits no edema.  Neurological: She is alert and oriented to person, place, and time.  Mild left facial droop. Mild dysarthria. CNs otherwise fully intact. Not confused. Strength/sensation intact.  Skin:  Wound on right great toe, no drainage, no erythema or fluctuance.   Labs: CBC:  Recent Labs Lab 01/31/17 2024 01/31/17 2032 02/01/17 0516  WBC 6.9  --  7.1  NEUTROABS 3.7  --   --   HGB 9.0* 9.5* 9.5*  HCT 28.5* 28.0* 29.9*  MCV 91.6  --  91.7  PLT 193  --  683   Metabolic Panel:  Recent Labs Lab 01/31/17 2024 01/31/17 2032 02/01/17 0516  NA 135 134* 136  K 5.0 4.9 4.6  CL 97* 100* 97*  CO2 25  --  26  GLUCOSE 123* 121* 88  BUN 59* 54* 62*  CREATININE 6.87* 6.80* 6.98*  CALCIUM 9.1  --  8.8*  PHOS  --   --   5.1*  ALT 17  --   --   ALKPHOS 128*  --   --   BILITOT 0.6  --   --   PROT 6.4*  --   --   ALBUMIN 3.2*  --  2.9*  LABPROT 16.5*  --   --   INR 1.32  --   --    Lipid Panel     Component Value Date/Time   CHOL 219 (H) 02/01/2017 0516   TRIG 99 02/01/2017 0516   HDL 70 02/01/2017 0516   CHOLHDL 3.1 02/01/2017 0516   VLDL 20 02/01/2017 0516   LDLCALC 129 (H) 02/01/2017 0516   Lab Results  Component Value Date   HGBA1C 6.3 (H) 07/27/2016     Medications: Scheduled Medications: . apixaban  5 mg Oral BID  . aspirin EC  81 mg Oral Daily  . calcium acetate  667 mg Oral TID WC  . citalopram  10 mg Oral Daily  . [START ON 02/02/2017] darbepoetin (ARANESP) injection - DIALYSIS  40 mcg Intravenous Q Sat-HD  . [START ON 02/02/2017] doxercalciferol  3 mcg Intravenous Q T,Th,Sa-HD  . hydrocerin   Topical Daily  . insulin aspart  0-5 Units Subcutaneous QHS  . insulin aspart  0-9 Units Subcutaneous TID WC  . multivitamin  1 tablet Oral QHS  . mupirocin cream   Topical Daily  . pantoprazole  40 mg Oral Daily  . sodium chloride flush  3 mL Intravenous Q12H  . sodium chloride flush  3 mL Intravenous Q12H   PRN Medications: sodium chloride, acetaminophen **OR** acetaminophen, gabapentin, polyethylene glycol, RESOURCE THICKENUP CLEAR, sodium chloride flush  Assessment/Plan: Pt is a 71 y.o. yo female with a PMHx of diabetes mellitus, previous left cerebral infarctions, seizure disorder, hypertension, hyperlipidemia, PAF and ESRD on dialysis, brought to the ED with facial droop and worsening of dysarthria.  TIA vs Recrudescence: Deficits mostly resolved today w/ dysarthria and facial droop back to baseline. CT and MRI negative. Multiple risk factors. Carotid dopplers with moderate stenosis on the right ICA, low grade on the left. Echo pending. SLP recs dysphagia diet for pre-existing deficits. PT/OT recs pending. Lipids mildly elevated w/o prior statin therapy. Will start atorvastatin.  Continue ASA 81mg . - Atorva, ASA secondary ppx  History of PAF. Currently in sinus rhythm. Would advise consideration of different AC given lack of evidence for Apixiban in ESRD. Recommend renal dosing change to 2.5 mg on discharge. - Apixaban 2.5mg  twice daily. - Restart home Coreg 3.125 mg BID  Hypertension. Currently normotensive. Restart home Coreg.  ESRD on HD (T, Th, S). She missed her dialysis on Thursday as she was not feeling well. Nephro aware, to get HD today prior to DC. -Continue PhosLo  Diabetes. Last A1c done in 07/27/2016 was 6.3. -Repeat A1c. -Sliding scale.  Peripheral vascular disease. She had left AKA. Had angioplasty in the right leg, along with 4 toes amputation. Has a chronic ulcer on right big toe. Continue wound care for her great toe ulcer. Does not look infected.  Length of Stay: 0 day(s) Dispo: Anticipated discharge today after HD.  Holley Raring, MD Pager: [phone] (7AM-5PM) 02/01/2017, 12:23 PM

## 2017-02-01 NOTE — Progress Notes (Signed)
Patient admitted from ED. Patient alert and oriented x 4. Tele placed and was verified. Patient oriented to room and made comfortable. Will continue to monitor.

## 2017-02-01 NOTE — Care Management Obs Status (Signed)
[facility] NOTIFICATION   Patient Details  Name: Emile Ringgenberg MRN: [id number] Date of Birth: [birth year]   Medicare Observation Status Notification Given:  Yes    Pollie Friar, RN 02/01/2017, 5:43 PM

## 2017-02-01 NOTE — Progress Notes (Signed)
Modified Barium Swallow Progress Note  Patient Details  Name: Deborah Hendricks MRN: [id number] Date of Birth: January 23, 1946  Today's Date: 02/01/2017  Modified Barium Swallow completed.  Full report located under Chart Review in the Imaging Section.  Brief recommendations include the following:  Clinical Impression  MBS was completed using thin liquids, nectar thick liquids, pureed material and dual textured solids.  The patient presented with mild oropharyngeal dysphagia characterized by a mild delay in oral transit and mild tongue pumping noted given pureed and dual textured material as well as a delayed swallow trigger which lead to silent penetration prior to the swallow given thin liquids via spoon and cup sips despite the use of a chin tuck.  Esophageal sweep revealed it VERY slow to clear.  Patient appeared to have full column stacking given small amounts of intake.  Usual strategies to facilitate clearance were not successful.  Suspect patient may clear to gravity.  May want to consider GI work up.  Recommend a regular diet with nectar thick liquids.  Patient should alternate food/liquids and sit up for at least an hour after any intake.  ST to follow up for education, therapeutic diet tolerance and to initiate education.  Patient will require ST at next level of care to address dysphagia.     Swallow Evaluation Recommendations   Recommended Consults: Consider GI evaluation   SLP Diet Recommendations: Nectar thick liquid;Regular solids   Liquid Administration via: Cup   Medication Administration: Crushed with puree   Supervision: Patient able to self feed   Compensations: Slow rate;Small sips/bites;Follow solids with liquid (Sit up for 60 minutes after intake.  )   Postural Changes: Seated upright at 90 degrees;Remain semi-upright after after feeds/meals (Comment)   Oral Care Recommendations: Oral care BID   Other Recommendations: Order thickener from pharmacy;Prohibited food (jello,  ice cream, thin soups)   Shelly Flatten, MA, CCC-SLP Acute Rehab SLP [phone] [place] 02/01/2017,10:34 AM

## 2017-02-01 NOTE — Procedures (Signed)
  I was present at this dialysis session, have reviewed the session itself and made  appropriate changes Kelly Splinter MD [place] pager [phone]   02/01/2017, 3:11 PM
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
## 2017-02-01 NOTE — Progress Notes (Signed)
Initial Nutrition Assessment   INTERVENTION:  Provide Nepro Shake po once daily, each supplement provides 425 kcal and 19 grams protein  Provide 30 ml Pro-stat once daily, provides 100 kcal and 15 grams of protein  NUTRITION DIAGNOSIS:   Increased nutrient needs related to wound healing, catabolic illness as evidenced by estimated needs.   GOAL:   Patient will meet greater than or equal to 90% of their needs   MONITOR:   PO intake, Skin, Labs, I & O's  REASON FOR ASSESSMENT:   Consult Wound healing  ASSESSMENT:   71 year old female presented to ED on 01/31/17 with right facial and arm/leg weakness and slurred speech. Pt has a past medical history of Anemia; Anxiety; CHF anesthesia; Constipation; Critical lower limb ischemia; Depression; ESRD on dialysis, Heart murmur; Hypertension; Memory loss; Peripheral vascular disease W L AKA; Refusal of blood transfusions as patient is Jehovah's Witness; Seizures (04/2015); Sleep apnea; Stroke (04/2015 and 2015); Type II diabetes mellitus. MRI found no acute intracranial abnormality and Chronic microvascular ischemia and old bilateral basal ganglia lacunar infarcts.  Pt out of room at time of visit. RN reports that patient is eating somewhat well. RN plans to change pt to carb modified diet instead of regular. Nutrition consult is for wound healing. Per chart, pt has chronic ulcer on right big toe. Pt has Ren-vit ordered daily.   Labs: low chloride, high BUN, high phosphorus, high creatinine, low hemoglobin  Diet Order:  Diet regular Room service appropriate? Yes; Fluid consistency: Nectar Thick Diet - low sodium heart healthy  Skin:  Wound (see comment) (R toe ulcer)  Last BM:  3/23  Height:   Ht Readings from Last 1 Encounters:  02/01/17 5\' 4"  (1.626 m)    Weight:   Wt Readings from Last 1 Encounters:  02/01/17 156 lb 12 oz (71.1 kg)    Ideal Body Weight:  54.5 kg  BMI:  Body mass index is 26.91 kg/m.  Estimated  Nutritional Needs:   Kcal:  1700-1900  Protein:  70-85 grams  Fluid:  per MD  EDUCATION NEEDS:   No education needs identified at this time  Scarlette Ar RD, LDN, CSP Inpatient Clinical Dietitian Pager: [phone] After Hours Pager: [phone]

## 2017-02-02 DIAGNOSIS — E119 Type 2 diabetes mellitus without complications: Secondary | ICD-10-CM | POA: Diagnosis not present

## 2017-02-02 DIAGNOSIS — D631 Anemia in chronic kidney disease: Secondary | ICD-10-CM | POA: Diagnosis not present

## 2017-02-02 DIAGNOSIS — N186 End stage renal disease: Secondary | ICD-10-CM | POA: Diagnosis not present

## 2017-02-02 DIAGNOSIS — N2581 Secondary hyperparathyroidism of renal origin: Secondary | ICD-10-CM | POA: Diagnosis not present

## 2017-02-02 LAB — HIV ANTIBODY (ROUTINE TESTING W REFLEX): HIV SCREEN 4TH GENERATION: NONREACTIVE

## 2017-02-02 LAB — HEMOGLOBIN A1C
Hgb A1c MFr Bld: 7.6 % — ABNORMAL HIGH (ref 4.8–5.6)
Hgb A1c MFr Bld: 7.6 % — ABNORMAL HIGH (ref 4.8–5.6)
Mean Plasma Glucose: 171 mg/dL
Mean Plasma Glucose: 171 mg/dL

## 2017-02-05 DIAGNOSIS — D631 Anemia in chronic kidney disease: Secondary | ICD-10-CM | POA: Diagnosis not present

## 2017-02-05 DIAGNOSIS — N2581 Secondary hyperparathyroidism of renal origin: Secondary | ICD-10-CM | POA: Diagnosis not present

## 2017-02-05 DIAGNOSIS — E119 Type 2 diabetes mellitus without complications: Secondary | ICD-10-CM | POA: Diagnosis not present

## 2017-02-05 DIAGNOSIS — N186 End stage renal disease: Secondary | ICD-10-CM | POA: Diagnosis not present

## 2017-02-07 DIAGNOSIS — N2581 Secondary hyperparathyroidism of renal origin: Secondary | ICD-10-CM | POA: Diagnosis not present

## 2017-02-07 DIAGNOSIS — N186 End stage renal disease: Secondary | ICD-10-CM | POA: Diagnosis not present

## 2017-02-07 DIAGNOSIS — D631 Anemia in chronic kidney disease: Secondary | ICD-10-CM | POA: Diagnosis not present

## 2017-02-07 DIAGNOSIS — E119 Type 2 diabetes mellitus without complications: Secondary | ICD-10-CM | POA: Diagnosis not present

## 2017-02-08 DIAGNOSIS — L97512 Non-pressure chronic ulcer of other part of right foot with fat layer exposed: Secondary | ICD-10-CM | POA: Diagnosis not present

## 2017-02-08 DIAGNOSIS — L97519 Non-pressure chronic ulcer of other part of right foot with unspecified severity: Secondary | ICD-10-CM | POA: Diagnosis not present

## 2017-02-08 DIAGNOSIS — E11621 Type 2 diabetes mellitus with foot ulcer: Secondary | ICD-10-CM | POA: Diagnosis not present

## 2017-02-08 DIAGNOSIS — E1151 Type 2 diabetes mellitus with diabetic peripheral angiopathy without gangrene: Secondary | ICD-10-CM | POA: Diagnosis not present

## 2017-02-08 DIAGNOSIS — I120 Hypertensive chronic kidney disease with stage 5 chronic kidney disease or end stage renal disease: Secondary | ICD-10-CM | POA: Diagnosis not present

## 2017-02-08 DIAGNOSIS — E1140 Type 2 diabetes mellitus with diabetic neuropathy, unspecified: Secondary | ICD-10-CM | POA: Diagnosis not present

## 2017-02-08 DIAGNOSIS — E1122 Type 2 diabetes mellitus with diabetic chronic kidney disease: Secondary | ICD-10-CM | POA: Diagnosis not present

## 2017-02-09 DIAGNOSIS — I120 Hypertensive chronic kidney disease with stage 5 chronic kidney disease or end stage renal disease: Secondary | ICD-10-CM | POA: Diagnosis not present

## 2017-02-09 DIAGNOSIS — D631 Anemia in chronic kidney disease: Secondary | ICD-10-CM | POA: Diagnosis not present

## 2017-02-09 DIAGNOSIS — E119 Type 2 diabetes mellitus without complications: Secondary | ICD-10-CM | POA: Diagnosis not present

## 2017-02-09 DIAGNOSIS — N2581 Secondary hyperparathyroidism of renal origin: Secondary | ICD-10-CM | POA: Diagnosis not present

## 2017-02-09 DIAGNOSIS — N186 End stage renal disease: Secondary | ICD-10-CM | POA: Diagnosis not present

## 2017-02-09 DIAGNOSIS — Z992 Dependence on renal dialysis: Secondary | ICD-10-CM | POA: Diagnosis not present

## 2017-02-12 DIAGNOSIS — N2581 Secondary hyperparathyroidism of renal origin: Secondary | ICD-10-CM | POA: Diagnosis not present

## 2017-02-12 DIAGNOSIS — E119 Type 2 diabetes mellitus without complications: Secondary | ICD-10-CM | POA: Diagnosis not present

## 2017-02-12 DIAGNOSIS — Z992 Dependence on renal dialysis: Secondary | ICD-10-CM | POA: Diagnosis not present

## 2017-02-12 DIAGNOSIS — D631 Anemia in chronic kidney disease: Secondary | ICD-10-CM | POA: Diagnosis not present

## 2017-02-12 DIAGNOSIS — N186 End stage renal disease: Secondary | ICD-10-CM | POA: Diagnosis not present

## 2017-02-13 DIAGNOSIS — R531 Weakness: Secondary | ICD-10-CM | POA: Diagnosis not present

## 2017-02-13 DIAGNOSIS — I69398 Other sequelae of cerebral infarction: Secondary | ICD-10-CM | POA: Diagnosis not present

## 2017-02-14 DIAGNOSIS — Z992 Dependence on renal dialysis: Secondary | ICD-10-CM | POA: Diagnosis not present

## 2017-02-14 DIAGNOSIS — D631 Anemia in chronic kidney disease: Secondary | ICD-10-CM | POA: Diagnosis not present

## 2017-02-14 DIAGNOSIS — N2581 Secondary hyperparathyroidism of renal origin: Secondary | ICD-10-CM | POA: Diagnosis not present

## 2017-02-14 DIAGNOSIS — E119 Type 2 diabetes mellitus without complications: Secondary | ICD-10-CM | POA: Diagnosis not present

## 2017-02-14 DIAGNOSIS — N186 End stage renal disease: Secondary | ICD-10-CM | POA: Diagnosis not present

## 2017-02-15 ENCOUNTER — Encounter (HOSPITAL_BASED_OUTPATIENT_CLINIC_OR_DEPARTMENT_OTHER): Payer: Medicare Other | Attending: Internal Medicine

## 2017-02-15 DIAGNOSIS — H269 Unspecified cataract: Secondary | ICD-10-CM | POA: Diagnosis not present

## 2017-02-15 DIAGNOSIS — I739 Peripheral vascular disease, unspecified: Secondary | ICD-10-CM | POA: Diagnosis not present

## 2017-02-15 DIAGNOSIS — E11621 Type 2 diabetes mellitus with foot ulcer: Secondary | ICD-10-CM | POA: Diagnosis not present

## 2017-02-15 DIAGNOSIS — I120 Hypertensive chronic kidney disease with stage 5 chronic kidney disease or end stage renal disease: Secondary | ICD-10-CM | POA: Diagnosis not present

## 2017-02-15 DIAGNOSIS — G40909 Epilepsy, unspecified, not intractable, without status epilepticus: Secondary | ICD-10-CM | POA: Insufficient documentation

## 2017-02-15 DIAGNOSIS — E1122 Type 2 diabetes mellitus with diabetic chronic kidney disease: Secondary | ICD-10-CM | POA: Insufficient documentation

## 2017-02-15 DIAGNOSIS — L97512 Non-pressure chronic ulcer of other part of right foot with fat layer exposed: Secondary | ICD-10-CM | POA: Insufficient documentation

## 2017-02-15 DIAGNOSIS — N186 End stage renal disease: Secondary | ICD-10-CM | POA: Diagnosis not present

## 2017-02-15 DIAGNOSIS — E1151 Type 2 diabetes mellitus with diabetic peripheral angiopathy without gangrene: Secondary | ICD-10-CM | POA: Diagnosis not present

## 2017-02-15 DIAGNOSIS — L97511 Non-pressure chronic ulcer of other part of right foot limited to breakdown of skin: Secondary | ICD-10-CM | POA: Diagnosis not present

## 2017-02-16 DIAGNOSIS — Z992 Dependence on renal dialysis: Secondary | ICD-10-CM | POA: Diagnosis not present

## 2017-02-16 DIAGNOSIS — N186 End stage renal disease: Secondary | ICD-10-CM | POA: Diagnosis not present

## 2017-02-16 DIAGNOSIS — D631 Anemia in chronic kidney disease: Secondary | ICD-10-CM | POA: Diagnosis not present

## 2017-02-16 DIAGNOSIS — E119 Type 2 diabetes mellitus without complications: Secondary | ICD-10-CM | POA: Diagnosis not present

## 2017-02-16 DIAGNOSIS — N2581 Secondary hyperparathyroidism of renal origin: Secondary | ICD-10-CM | POA: Diagnosis not present

## 2017-02-19 DIAGNOSIS — N186 End stage renal disease: Secondary | ICD-10-CM | POA: Diagnosis not present

## 2017-02-19 DIAGNOSIS — Z992 Dependence on renal dialysis: Secondary | ICD-10-CM | POA: Diagnosis not present

## 2017-02-19 DIAGNOSIS — E119 Type 2 diabetes mellitus without complications: Secondary | ICD-10-CM | POA: Diagnosis not present

## 2017-02-19 DIAGNOSIS — N2581 Secondary hyperparathyroidism of renal origin: Secondary | ICD-10-CM | POA: Diagnosis not present

## 2017-02-19 DIAGNOSIS — D631 Anemia in chronic kidney disease: Secondary | ICD-10-CM | POA: Diagnosis not present

## 2017-02-20 DIAGNOSIS — I69398 Other sequelae of cerebral infarction: Secondary | ICD-10-CM | POA: Diagnosis not present

## 2017-02-20 DIAGNOSIS — R531 Weakness: Secondary | ICD-10-CM | POA: Diagnosis not present

## 2017-02-21 DIAGNOSIS — N2581 Secondary hyperparathyroidism of renal origin: Secondary | ICD-10-CM | POA: Diagnosis not present

## 2017-02-21 DIAGNOSIS — D631 Anemia in chronic kidney disease: Secondary | ICD-10-CM | POA: Diagnosis not present

## 2017-02-21 DIAGNOSIS — N186 End stage renal disease: Secondary | ICD-10-CM | POA: Diagnosis not present

## 2017-02-21 DIAGNOSIS — Z992 Dependence on renal dialysis: Secondary | ICD-10-CM | POA: Diagnosis not present

## 2017-02-21 DIAGNOSIS — E119 Type 2 diabetes mellitus without complications: Secondary | ICD-10-CM | POA: Diagnosis not present

## 2017-02-22 ENCOUNTER — Telehealth: Payer: Self-pay | Admitting: *Deleted

## 2017-02-22 DIAGNOSIS — I69398 Other sequelae of cerebral infarction: Secondary | ICD-10-CM | POA: Diagnosis not present

## 2017-02-22 DIAGNOSIS — E1151 Type 2 diabetes mellitus with diabetic peripheral angiopathy without gangrene: Secondary | ICD-10-CM | POA: Diagnosis not present

## 2017-02-22 DIAGNOSIS — L97512 Non-pressure chronic ulcer of other part of right foot with fat layer exposed: Secondary | ICD-10-CM | POA: Diagnosis not present

## 2017-02-22 DIAGNOSIS — I739 Peripheral vascular disease, unspecified: Secondary | ICD-10-CM | POA: Diagnosis not present

## 2017-02-22 DIAGNOSIS — H269 Unspecified cataract: Secondary | ICD-10-CM | POA: Diagnosis not present

## 2017-02-22 DIAGNOSIS — E11621 Type 2 diabetes mellitus with foot ulcer: Secondary | ICD-10-CM | POA: Diagnosis not present

## 2017-02-22 DIAGNOSIS — G40909 Epilepsy, unspecified, not intractable, without status epilepticus: Secondary | ICD-10-CM | POA: Diagnosis not present

## 2017-02-22 DIAGNOSIS — R531 Weakness: Secondary | ICD-10-CM | POA: Diagnosis not present

## 2017-02-22 NOTE — Telephone Encounter (Signed)
PreVisit Call completed. Pts daughter states that the pt is prescribed Lantus but she only uses it PRN. She would like guidance regarding her Lantus. She will look for vaccination records. Pt is currently undergoing physical therapy from  Left AKA and right toe amputations. Daughter requests referral for new wheelchair and a shoe with foam to keep from getting blisters. Pt currently has a dialysis port in her leg that will be removed May 9th in [place]. She will then have a graft placed in her arm. Daughter requests medication reconciliation to ensure that pt is on only what she needs.

## 2017-02-23 DIAGNOSIS — N186 End stage renal disease: Secondary | ICD-10-CM | POA: Diagnosis not present

## 2017-02-23 DIAGNOSIS — D631 Anemia in chronic kidney disease: Secondary | ICD-10-CM | POA: Diagnosis not present

## 2017-02-23 DIAGNOSIS — N2581 Secondary hyperparathyroidism of renal origin: Secondary | ICD-10-CM | POA: Diagnosis not present

## 2017-02-23 DIAGNOSIS — E119 Type 2 diabetes mellitus without complications: Secondary | ICD-10-CM | POA: Diagnosis not present

## 2017-02-23 DIAGNOSIS — Z992 Dependence on renal dialysis: Secondary | ICD-10-CM | POA: Diagnosis not present

## 2017-02-25 ENCOUNTER — Ambulatory Visit: Payer: Medicare Other | Admitting: Family Medicine

## 2017-02-26 DIAGNOSIS — N2581 Secondary hyperparathyroidism of renal origin: Secondary | ICD-10-CM | POA: Diagnosis not present

## 2017-02-26 DIAGNOSIS — E119 Type 2 diabetes mellitus without complications: Secondary | ICD-10-CM | POA: Diagnosis not present

## 2017-02-26 DIAGNOSIS — N186 End stage renal disease: Secondary | ICD-10-CM | POA: Diagnosis not present

## 2017-02-26 DIAGNOSIS — Z992 Dependence on renal dialysis: Secondary | ICD-10-CM | POA: Diagnosis not present

## 2017-02-26 DIAGNOSIS — D631 Anemia in chronic kidney disease: Secondary | ICD-10-CM | POA: Diagnosis not present

## 2017-02-27 ENCOUNTER — Other Ambulatory Visit: Payer: Self-pay | Admitting: Internal Medicine

## 2017-02-27 DIAGNOSIS — I69398 Other sequelae of cerebral infarction: Secondary | ICD-10-CM | POA: Diagnosis not present

## 2017-02-27 DIAGNOSIS — R531 Weakness: Secondary | ICD-10-CM | POA: Diagnosis not present

## 2017-02-28 ENCOUNTER — Other Ambulatory Visit: Payer: Self-pay | Admitting: Internal Medicine

## 2017-02-28 DIAGNOSIS — N186 End stage renal disease: Secondary | ICD-10-CM | POA: Diagnosis not present

## 2017-02-28 DIAGNOSIS — E119 Type 2 diabetes mellitus without complications: Secondary | ICD-10-CM | POA: Diagnosis not present

## 2017-02-28 DIAGNOSIS — N2581 Secondary hyperparathyroidism of renal origin: Secondary | ICD-10-CM | POA: Diagnosis not present

## 2017-02-28 DIAGNOSIS — D631 Anemia in chronic kidney disease: Secondary | ICD-10-CM | POA: Diagnosis not present

## 2017-02-28 DIAGNOSIS — Z992 Dependence on renal dialysis: Secondary | ICD-10-CM | POA: Diagnosis not present

## 2017-02-28 NOTE — Progress Notes (Signed)
   07/27/16 0927  SLP Time Calculation  SLP Start Time (ACUTE ONLY) 0800  SLP Stop Time (ACUTE ONLY) 0829  SLP Time Calculation (min) (ACUTE ONLY) 29 min  Assessment  SLP Visit Diagnosis Dysphagia, unspecified (R13.10)  SLP G-Codes **NOT FOR INPATIENT CLASS**  Functional Assessment Tool Used clinical judgement  Functional Limitations Swallowing  Swallow Current Status (N0272) CI  Swallow Goal Status (Z3664) CI  Swallow Discharge Status (Q0347) CI  SLP Evaluations  $ SLP Speech Visit 1 Procedure  SLP Evaluations  $BSS Swallow 1 Procedure  $Swallowing Treatment 1 Procedure  Diagnosis added for original entry complete by [place], CCC-SLP [phone]

## 2017-03-01 ENCOUNTER — Other Ambulatory Visit (HOSPITAL_COMMUNITY)
Admission: RE | Admit: 2017-03-01 | Discharge: 2017-03-01 | Disposition: A | Discharge status: Home / Self Care | Payer: Medicare Other | Source: Other Acute Inpatient Hospital | Attending: Internal Medicine | Admitting: Internal Medicine

## 2017-03-01 DIAGNOSIS — I739 Peripheral vascular disease, unspecified: Secondary | ICD-10-CM | POA: Diagnosis not present

## 2017-03-01 DIAGNOSIS — E1151 Type 2 diabetes mellitus with diabetic peripheral angiopathy without gangrene: Secondary | ICD-10-CM | POA: Diagnosis not present

## 2017-03-01 DIAGNOSIS — E11621 Type 2 diabetes mellitus with foot ulcer: Secondary | ICD-10-CM | POA: Insufficient documentation

## 2017-03-01 DIAGNOSIS — H269 Unspecified cataract: Secondary | ICD-10-CM | POA: Diagnosis not present

## 2017-03-01 DIAGNOSIS — L97512 Non-pressure chronic ulcer of other part of right foot with fat layer exposed: Secondary | ICD-10-CM | POA: Diagnosis not present

## 2017-03-01 DIAGNOSIS — G40909 Epilepsy, unspecified, not intractable, without status epilepticus: Secondary | ICD-10-CM | POA: Diagnosis not present

## 2017-03-02 DIAGNOSIS — D631 Anemia in chronic kidney disease: Secondary | ICD-10-CM | POA: Diagnosis not present

## 2017-03-02 DIAGNOSIS — Z992 Dependence on renal dialysis: Secondary | ICD-10-CM | POA: Diagnosis not present

## 2017-03-02 DIAGNOSIS — N186 End stage renal disease: Secondary | ICD-10-CM | POA: Diagnosis not present

## 2017-03-02 DIAGNOSIS — E119 Type 2 diabetes mellitus without complications: Secondary | ICD-10-CM | POA: Diagnosis not present

## 2017-03-02 DIAGNOSIS — N2581 Secondary hyperparathyroidism of renal origin: Secondary | ICD-10-CM | POA: Diagnosis not present

## 2017-03-04 LAB — AEROBIC CULTURE W GRAM STAIN (SUPERFICIAL SPECIMEN)

## 2017-03-05 DIAGNOSIS — N2581 Secondary hyperparathyroidism of renal origin: Secondary | ICD-10-CM | POA: Diagnosis not present

## 2017-03-05 DIAGNOSIS — N186 End stage renal disease: Secondary | ICD-10-CM | POA: Diagnosis not present

## 2017-03-05 DIAGNOSIS — Z992 Dependence on renal dialysis: Secondary | ICD-10-CM | POA: Diagnosis not present

## 2017-03-05 DIAGNOSIS — D631 Anemia in chronic kidney disease: Secondary | ICD-10-CM | POA: Diagnosis not present

## 2017-03-05 DIAGNOSIS — E119 Type 2 diabetes mellitus without complications: Secondary | ICD-10-CM | POA: Diagnosis not present

## 2017-03-06 ENCOUNTER — Ambulatory Visit (INDEPENDENT_AMBULATORY_CARE_PROVIDER_SITE_OTHER): Payer: Medicare Other | Admitting: Family Medicine

## 2017-03-06 ENCOUNTER — Encounter: Payer: Self-pay | Admitting: Family Medicine

## 2017-03-06 VITALS — BP 168/84 | HR 70 | Temp 98.3°F | Ht 66.0 in

## 2017-03-06 DIAGNOSIS — Z992 Dependence on renal dialysis: Secondary | ICD-10-CM

## 2017-03-06 DIAGNOSIS — T829XXS Unspecified complication of cardiac and vascular prosthetic device, implant and graft, sequela: Secondary | ICD-10-CM

## 2017-03-06 DIAGNOSIS — R569 Unspecified convulsions: Secondary | ICD-10-CM

## 2017-03-06 DIAGNOSIS — Z86711 Personal history of pulmonary embolism: Secondary | ICD-10-CM

## 2017-03-06 DIAGNOSIS — IMO0001 Reserved for inherently not codable concepts without codable children: Secondary | ICD-10-CM

## 2017-03-06 DIAGNOSIS — G629 Polyneuropathy, unspecified: Secondary | ICD-10-CM

## 2017-03-06 DIAGNOSIS — I6523 Occlusion and stenosis of bilateral carotid arteries: Secondary | ICD-10-CM

## 2017-03-06 DIAGNOSIS — K5900 Constipation, unspecified: Secondary | ICD-10-CM

## 2017-03-06 DIAGNOSIS — G4730 Sleep apnea, unspecified: Secondary | ICD-10-CM | POA: Diagnosis not present

## 2017-03-06 DIAGNOSIS — R079 Chest pain, unspecified: Secondary | ICD-10-CM

## 2017-03-06 DIAGNOSIS — G458 Other transient cerebral ischemic attacks and related syndromes: Secondary | ICD-10-CM

## 2017-03-06 DIAGNOSIS — E1149 Type 2 diabetes mellitus with other diabetic neurological complication: Secondary | ICD-10-CM | POA: Diagnosis not present

## 2017-03-06 DIAGNOSIS — Z531 Procedure and treatment not carried out because of patient's decision for reasons of belief and group pressure: Secondary | ICD-10-CM | POA: Diagnosis not present

## 2017-03-06 DIAGNOSIS — I639 Cerebral infarction, unspecified: Secondary | ICD-10-CM

## 2017-03-06 DIAGNOSIS — Z7409 Other reduced mobility: Secondary | ICD-10-CM

## 2017-03-06 DIAGNOSIS — K219 Gastro-esophageal reflux disease without esophagitis: Secondary | ICD-10-CM

## 2017-03-06 DIAGNOSIS — N186 End stage renal disease: Secondary | ICD-10-CM

## 2017-03-06 DIAGNOSIS — Z87891 Personal history of nicotine dependence: Secondary | ICD-10-CM

## 2017-03-06 DIAGNOSIS — L97911 Non-pressure chronic ulcer of unspecified part of right lower leg limited to breakdown of skin: Secondary | ICD-10-CM

## 2017-03-06 DIAGNOSIS — E785 Hyperlipidemia, unspecified: Secondary | ICD-10-CM | POA: Diagnosis not present

## 2017-03-06 DIAGNOSIS — I6930 Unspecified sequelae of cerebral infarction: Secondary | ICD-10-CM

## 2017-03-06 DIAGNOSIS — D631 Anemia in chronic kidney disease: Secondary | ICD-10-CM

## 2017-03-06 DIAGNOSIS — Z90710 Acquired absence of both cervix and uterus: Secondary | ICD-10-CM | POA: Diagnosis not present

## 2017-03-06 DIAGNOSIS — I150 Renovascular hypertension: Secondary | ICD-10-CM

## 2017-03-06 DIAGNOSIS — R1312 Dysphagia, oropharyngeal phase: Secondary | ICD-10-CM

## 2017-03-06 DIAGNOSIS — I480 Paroxysmal atrial fibrillation: Secondary | ICD-10-CM

## 2017-03-06 NOTE — Progress Notes (Signed)
Pre visit review using our clinic review tool, if applicable. No additional management support is needed unless otherwise documented below in the visit note. 

## 2017-03-06 NOTE — Progress Notes (Signed)
Deborah Hendricks is a 71 y.o. female is here to [place].   History of Present Illness:   Shaune Pascal CMA acting as scribe for Dr. Juleen China.  CC: Patient is coming in today to establish care. Patients daughter is wanting to discuss her Lantus. She only uses this PRN. She has been having chest pain that has been going on for a couple of weeks. She stated that sometimes it is sharp. It sometimes moves to the upper abdomen. She states that she wonders if it comes from the food that she is eating. She could not tell me if the pain comes on when she eats or at other times.   HPI: See A/P for Problem Based Charting.   Records requested from previous PCP at [facility] to update HM.  Health Maintenance Due  Topic Date Due  . Hepatitis C Screening  27-Jul-1946  . OPHTHALMOLOGY EXAM  01/15/1955  . TETANUS/TDAP  01/15/1964  . COLONOSCOPY  01/15/1995  . DEXA SCAN  01/14/2010  . PNA vac Low Risk Adult (1 of 2 - PCV13) 01/14/2010   PMHx, SurgHx, SocialHx, Medications, and Allergies were reviewed in the Visit Navigator and updated as appropriate.   Patient Active Problem List   Diagnosis Date Noted  . History of hysterectomy 03/09/2017  . Former smoker 03/09/2017  . Dysphagia 03/09/2017  . Carotid stenosis 03/09/2017  . Need for assistance due to reduced mobility 03/09/2017  . Complication of vascular access for dialysis 03/09/2017  . Anemia in chronic kidney disease (CKD) 03/09/2017  . Sleep apnea   . Refusal of blood transfusions as patient is Jehovah's Witness   . Constipation   . GERD (gastroesophageal reflux disease)   . HLD (hyperlipidemia)   . TIA (transient ischemic attack) 02/01/2017  . Diabetic ulcer of toe of right foot associated with diabetes mellitus due to underlying condition, limited to breakdown of skin ([place])   . PVD (peripheral vascular disease) ([place]) 11/27/2016  . Blurry vision   . Asymmetric SNHL (sensorineural hearing loss) 06/12/2016  . Peripheral neuropathy  06/11/2016  . History of pulmonary embolism 06/08/2016  . History of CVA with residual deficit 06/08/2016  . History of borderline personality disorder 04/19/2016  . History of medication noncompliance 04/19/2016  . Paroxysmal atrial fibrillation ([place]) 04/19/2016  . Seizure disorder ([place]) 04/19/2016  . Renovascular hypertension 04/13/2016  . ESRD on dialysis ([place]) 04/11/2016  . Generalized anxiety disorder 04/11/2016  . Major depression, recurrent, chronic ([place]) 04/11/2016  . S/P AKA (above knee amputation) unilateral, left ([place]) 04/11/2016  . PAD (peripheral artery disease) ([place]) 12/15/2015  . Type II diabetes mellitus with neurological manifestations ([place]) 10/21/2015  . Seizures ([place]) 04/13/2015   Past Surgical History:  Procedure Laterality Date  . ABDOMINAL HYSTERECTOMY    . AMPUTATION Left 12/15/2015   Procedure: LEFT ABOVE KNEE AMPUTATION;  Surgeon: Serafina Mitchell, MD;  Location: [place];  Service: Vascular;  Laterality: Left;  . BASCILIC VEIN TRANSPOSITION Right 11/25/2015   Procedure: Right Arm FIRST STAGE BASILIC VEIN TRANSPOSITION;  Surgeon: Serafina Mitchell, MD;  Location: [place];  Service: Vascular;  Laterality: Right;  . BASCILIC VEIN TRANSPOSITION Right 01/19/2016   Procedure: RIGHT SECOND STAGE BASILIC VEIN TRANSPOSITION;  Surgeon: Serafina Mitchell, MD;  Location: [place];  Service: Vascular;  Laterality: Right;  . DILATION AND CURETTAGE OF UTERUS    . EYE SURGERY    . PERIPHERAL VASCULAR CATHETERIZATION Bilateral 11/28/2015   Procedure: Lower Extremity Angiography;  Surgeon: Roderic Palau  Adora Fridge, MD;  Location: [place] [facility];  Service: Cardiovascular;  Laterality: Bilateral;  . PERIPHERAL VASCULAR CATHETERIZATION N/A 11/28/2015   Procedure: Abdominal Aortogram;  Surgeon: Lorretta Harp, MD;  Location: [place] [facility];  Service: Cardiovascular;  Laterality: N/A;  . PERIPHERAL VASCULAR CATHETERIZATION Right 11/27/2016  . PERIPHERAL VASCULAR CATHETERIZATION N/A 11/27/2016    Procedure: Abdominal Aortogram w/Lower Extremity;  Surgeon: Serafina Mitchell, MD;  Location: [place] [facility];  Service: Cardiovascular;  Laterality: N/A;  . PERIPHERAL VASCULAR CATHETERIZATION N/A 11/27/2016   Procedure: Right arm and central venogram;  Surgeon: Serafina Mitchell, MD;  Location: [place] [facility];  Service: Cardiovascular;  Laterality: N/A;  . PERIPHERAL VASCULAR CATHETERIZATION Right 11/27/2016   Procedure: Peripheral Vascular Atherectomy;  Surgeon: Serafina Mitchell, MD;  Location: [place] [facility];  Service: Cardiovascular;  Laterality: Right;  Rt    sfa, popliteal, tibioperoneal trunk, peroneal  . PERIPHERAL VASCULAR CATHETERIZATION Right 11/27/2016   Procedure: Peripheral Vascular Balloon Angioplasty;  Surgeon: Serafina Mitchell, MD;  Location: [place] [facility];  Service: Cardiovascular;  Laterality: Right;  Tibial peroneal  . RETINOPATHY SURGERY Bilateral   . TOE AMPUTATION Right    2nd, 3rd, 4th, 5th digits  . TONSILLECTOMY      Family History  Problem Relation Age of Onset  . Asthma Mother   . Cancer Sister    Social History  Substance Use Topics  . Smoking status: Former Smoker    Packs/day: 0.25    Years: 51.00    Types: Cigarettes    Quit date: 04/13/2015  . Smokeless tobacco: Never Used  . Alcohol use 0.0 oz/week     Comment: 11/27/2016 "stopped in 04/2015"   Current Medications and Allergies:   .  Amino Acids-Protein Hydrolys (FEEDING SUPPLEMENT, PRO-STAT SUGAR FREE 64,) LIQD, Take 30 mLs by mouth daily., Disp: 900 mL, Rfl: 0 .  apixaban (ELIQUIS) 2.5 MG TABS tablet, Take 1 tablet (2.5 mg total) by mouth 2 (two) times daily., Disp: 60 tablet, Rfl: 0 .  aspirin EC 81 MG tablet, Take 1 tablet (81 mg total) by mouth daily., Disp: 60 tablet, Rfl: 1 .  atorvastatin (LIPITOR) 40 MG tablet, Take 1 tablet (40 mg total) by mouth daily., Disp: 30 tablet, Rfl: 0 .  calcium acetate (PHOSLO) 667 MG capsule, Take 667 mg by mouth 3 (three) times daily with meals., Disp: ,  Rfl:  .  carvedilol (COREG) 3.125 MG tablet, Take 1 tablet (3.125 mg total) by mouth 2 (two) times daily with a meal. (Patient taking differently: Take 3.125 mg by mouth 2 (two) times daily. Take 3.125 mg by mouth daily on Tuesday, Thursday and Saturday after dialysis. Take 3.125 mg by mouth twice daily on all other days), Disp: 60 tablet, Rfl: 1 .  citalopram (CELEXA) 10 MG tablet, Take 10 mg by mouth daily., Disp: , Rfl: 3 .  esomeprazole (NEXIUM) 20 MG capsule, Take 20 mg by mouth daily., Disp: , Rfl: 3 .  fluticasone (FLONASE) 50 MCG/ACT nasal spray, Place 2 sprays into both nostrils daily., Disp: , Rfl:  .  gabapentin (NEURONTIN) 100 MG capsule, Take 1 capsule (100 mg total) by mouth at bedtime., Disp: 30 capsule, Rfl: 0 .  insulin aspart (NOVOLOG) 100 UNIT/ML injection, Inject 2-10 Units into the skin 3 (three) times daily as needed for high blood sugar. 151-200=2 units, 201-250=4 units, 251-300=6 units, 301-350=8 units, 351-400=10 units, >400=10 units and Call MD, Disp: , Rfl:  .  insulin glargine (LANTUS) 100 UNIT/ML injection, Inject 2 Units into the skin daily., Disp: , Rfl:  .  multivitamin (RENA-VIT) TABS tablet, Take 1 tablet by mouth daily., Disp: , Rfl:  .  Nutritional Supplements (FEEDING SUPPLEMENT, NEPRO CARB STEADY,) LIQD, Take 237 mLs by mouth daily., Disp: 237 mL, Rfl: 12 .  ondansetron (ZOFRAN) 4 MG tablet, Take 4 mg by mouth every 8 (eight) hours as needed for nausea or vomiting., Disp: , Rfl:   Allergies  Allergen Reactions  . Penicillins Hives, Itching, Rash and Other (See Comments)    Has patient had a PCN reaction causing immediate rash, facial/tongue/throat swelling, SOB or lightheadedness with hypotension: Yes Has patient had a PCN reaction causing severe rash involving mucus membranes or skin necrosis:  Has patient had a PCN reaction that required hospitalization  Has patient had a PCN reaction occurring within the last 10 years:  If all of the above answers are "NO",  then may proceed with Cephalosporin use.  [name] Amoxicillin Hives, Itching and Rash  . Latex Rash  . Sulfa Antibiotics Other (See Comments)    Unknown    Review of Systems:   Review of Systems  Constitutional: Positive for malaise/fatigue. Negative for chills and fever.       Patient states that after she takes her medication she feels the fatigue.   HENT: Negative for congestion, ear pain, sinus pain and sore throat.   Eyes: Positive for blurred vision. Negative for double vision.       Chronic issue. Mainly when watching TV.  Respiratory: Negative for cough, shortness of breath and wheezing.   Cardiovascular: Positive for chest pain. Negative for palpitations and leg swelling.       Patient stats that she has chest pain and sometimes in the abdomen.  Gastrointestinal: Negative for constipation, diarrhea and vomiting.  Genitourinary: Negative for dysuria.  Musculoskeletal: Negative for back pain, joint pain and neck pain.  Neurological: Negative for dizziness and headaches.  Psychiatric/Behavioral: Negative for depression, hallucinations and memory loss.   Vitals:   Vitals:   03/06/17 1407  BP: (!) 168/84  Pulse: 70  Temp: 98.3 F (36.8 C)  TempSrc: Oral  SpO2: 98%  Height: 5\' 6"  (1.676 m)     There is no height or weight on file to calculate BMI.  Physical Exam:   Physical Exam  Constitutional: She appears well-developed and well-nourished. No distress.  She is sitting comfortably in a wheelchair.  HENT:  Head: Normocephalic and atraumatic.  Eyes: EOM are normal. Pupils are equal, round, and reactive to light.  Neck: Normal range of motion. No thyromegaly present.  Cardiovascular: Normal rate.   Pulmonary/Chest: Effort normal.  Abdominal: Soft.  Musculoskeletal:       Legs: Left AKA. Right foot bandaged inside of postop shoe.   Neurological: She is alert. No cranial nerve deficit.  Skin: Skin is warm.  Psychiatric: She has a normal mood and affect. Her behavior is  normal.  Nursing note and vitals reviewed.  EKG: 1st degree AV block, rate 70.   Assessment and Plan:   TIA (transient ischemic attack) From DC (01/31/17):  TIA vs Recrudescence: Patient presented with concerns for worsening dysarthria and facial droop. The patient has a prior history of stroke with pre-existing dysarthria and facial droop deficits. It was thought that these deficits at worsened somewhat on her presentation. She was evaluated for acute stroke with CT and MRI which were negative. Her deficits mostly resolved on the day of discharge  w/ dysarthria and facial droop back to baseline. She does have multiple risk factors. Stroke workup demonstrated carotid dopplers with moderate stenosis on the right ICA, low grade stenosis on the left. Speech pathology evaluated the pt and recommended dysphagia diet for pre-existing deficits of masticatory and pharyngeal muscles. Lipids panel showed mildly elevated total cholesterol w/o prior statin therapy. We have started atorvastatin on discharge and continue ASA 81mg  for secondary prophylaxis.  Renovascular hypertension Denies chest pain. Taking medications as prescribed without side effects.   Wt Readings from Last 3 Encounters:  02/01/17 156 lb 4.9 oz (70.9 kg)  01/28/17 150 lb (68 kg)  12/19/16 150 lb (68 kg)   Reports that she quit smoking about 22 months ago. Her smoking use included Cigarettes. She has a 12.75 pack-year smoking history.   BP Readings from Last 3 Encounters:  03/06/17 (!) 168/84  02/01/17 (!) 143/82  01/28/17 135/71   Lab Results  Component Value Date   CREATININE 6.98 (H) 02/01/2017    HLD (hyperlipidemia) On Lipitor since 01/2017 hospitalization.  Lab Results  Component Value Date   CHOL 219 (H) 02/01/2017   HDL 70 02/01/2017   LDLCALC 129 (H) 02/01/2017   TRIG 99 02/01/2017   CHOLHDL 3.1 02/01/2017    Type II diabetes mellitus with neurological manifestations ([place]) Lab Results  Component Value Date     HGBA1C 7.6 (H) 02/01/2017   Unclear insulin regimen. Will clarify with pharmacy and have patient/CNA bring medications to office to review. She will meet with our PA/RD, Inda Coke to review diet and regimen  Peripheral neuropathy On Neurontin. Recently decreased due to sedation concerns.  Diabetic ulcer of toe of right foot associated with diabetes mellitus due to underlying condition, limited to breakdown of skin ([place]) Followed by Wound Care. Doxycyline started today. Foot bandaged and not examined by me today.  Dysphagia Swallow study while hospitalized 01/2017. Mild aspiration risk. Recommend a regular diet with nectar thick liquids.  Patient should alternate food/liquids and sit up for at least an hour after any intake.  Carotid stenosis Summary:  - Right - 60% to 79% ICA stenosis. Vertebral artery flow is   antegrade. - Left - 1% to 39 % ICA stenosis. Vertebral artery flow is   antegrade.  Need for assistance due to reduced mobility Will have evaluation completed for power wheelchair.  History of CVA with residual deficit MRI brain: multifocal white matter disease, several acute lacunar white matter infarcts in the left parietal region. Residual right hemiparesis & speech deficits from 2nd CVA in 2014.   History of pulmonary embolism History of remote pulmonary embolus and on Eliquis.  Anemia in chronic kidney disease (CKD) CBC Latest Ref Rng & Units 02/01/2017 01/31/2017 01/31/2017  WBC 4.0 - 10.5 K/uL 7.1 - 6.9  Hemoglobin 12.0 - 15.0 g/dL 9.5(L) 9.5(L) 9.0(L)  Hematocrit 36.0 - 46.0 % 29.9(L) 28.0(L) 28.5(L)  Platelets 150 - 400 K/uL 176 - 160     Complication of vascular access for dialysis Hx of multiple grafts and fistulas in the upper extremities. Current access is right lower extremity. She will be following up with Vascular in May to review options.  Paroxysmal atrial fibrillation (HCC) On Eliquis, HD dosing.   ESRD on dialysis [facility]) Requesting  records from [facility]. Dialysis TTS.    [name] Reviewed expectations re: course of current medical issues. . Discussed self-management of symptoms. . Outlined signs and symptoms indicating need for more acute intervention. . Patient verbalized understanding and all questions  were answered. . See orders for this visit as documented in the electronic medical record. . Patient received an After Visit Summary.  Records requested if needed. I spent 60 minutes with this patient, greater than 50% was face-to-face time counseling regarding the above diagnoses.  CMA served as Education administrator during this visit. History, Physical, and Plan performed by medical provider. Documentation and orders reviewed and attested to. Briscoe Deutscher, D.O.  Briscoe Deutscher, [place], [place] 03/12/2017   Follow-up: No Follow-up on file.  Meds ordered this encounter  Medications  . doxycycline (VIBRAMYCIN) 100 MG capsule    Sig: Take 1 capsule by mouth 2 (two) times daily.    Refill:  0  . esomeprazole (NEXIUM) 20 MG capsule    Sig: Take 1 capsule (20 mg total) by mouth daily.    Dispense:  90 capsule    Refill:  3   Medications Discontinued During This Encounter  Medication Reason  . esomeprazole (NEXIUM) 20 MG capsule Reorder   Orders Placed This Encounter  Procedures  . POCT glycosylated hemoglobin (Hb A1C)  . EKG 12-Lead   Future Appointments Date Time Provider [place]  03/15/2017 11:00 AM [place] [facility]  05/13/2017 12:30 PM [facility] 1 [facility]  05/13/2017 1:30 PM [facility]  05/13/2017 1:45 PM Serafina Mitchell, MD [facility]  03/12/2018 11:00 AM Cassandra Lenard Lance, RN [facility] None

## 2017-03-06 NOTE — Progress Notes (Signed)
   07/27/16 0921  SLP Assessment  SLP Visit Diagnosis Cognitive communication deficit (R41.841)  Progression Toward Goals  Progression toward goals Progressing toward goals  SLP Time Calculation  SLP Start Time (ACUTE ONLY) 0830  SLP Stop Time (ACUTE ONLY) 0908  SLP Time Calculation (min) (ACUTE ONLY) 38 min  SLP Evaluations  $ SLP Speech Visit 1 Procedure  SLP Evaluations  $ SLP EVAL LANGUAGE/SOUND PRODUCTION 1 Procedure  Late entry (diagnosis code) for evaluation complete by Jillyn Ledger 07/27/16.  [place], [place] [phone]

## 2017-03-06 NOTE — Patient Instructions (Signed)
Schedule with Cassie.  Schedule nutrition with Sam.  Schedule wheelchair discussion. Cassie to schedule.

## 2017-03-07 ENCOUNTER — Other Ambulatory Visit: Payer: Self-pay

## 2017-03-07 ENCOUNTER — Encounter: Payer: Self-pay | Admitting: Family Medicine

## 2017-03-07 DIAGNOSIS — Z992 Dependence on renal dialysis: Secondary | ICD-10-CM | POA: Diagnosis not present

## 2017-03-07 DIAGNOSIS — N2581 Secondary hyperparathyroidism of renal origin: Secondary | ICD-10-CM | POA: Diagnosis not present

## 2017-03-07 DIAGNOSIS — E119 Type 2 diabetes mellitus without complications: Secondary | ICD-10-CM | POA: Diagnosis not present

## 2017-03-07 DIAGNOSIS — N186 End stage renal disease: Secondary | ICD-10-CM | POA: Diagnosis not present

## 2017-03-07 DIAGNOSIS — D631 Anemia in chronic kidney disease: Secondary | ICD-10-CM | POA: Diagnosis not present

## 2017-03-07 MED ORDER — ATORVASTATIN CALCIUM 40 MG PO TABS: 40.0000 mg | ORAL_TABLET | Freq: Every day | ORAL | 3 refills | Status: AC

## 2017-03-07 MED ORDER — APIXABAN 2.5 MG PO TABS: 2.5000 mg | ORAL_TABLET | Freq: Two times a day (BID) | ORAL | 3 refills | Status: DC

## 2017-03-08 DIAGNOSIS — R531 Weakness: Secondary | ICD-10-CM | POA: Diagnosis not present

## 2017-03-08 DIAGNOSIS — H269 Unspecified cataract: Secondary | ICD-10-CM | POA: Diagnosis not present

## 2017-03-08 DIAGNOSIS — I739 Peripheral vascular disease, unspecified: Secondary | ICD-10-CM | POA: Diagnosis not present

## 2017-03-08 DIAGNOSIS — E1151 Type 2 diabetes mellitus with diabetic peripheral angiopathy without gangrene: Secondary | ICD-10-CM | POA: Diagnosis not present

## 2017-03-08 DIAGNOSIS — E11621 Type 2 diabetes mellitus with foot ulcer: Secondary | ICD-10-CM | POA: Diagnosis not present

## 2017-03-08 DIAGNOSIS — I69398 Other sequelae of cerebral infarction: Secondary | ICD-10-CM | POA: Diagnosis not present

## 2017-03-08 DIAGNOSIS — L97511 Non-pressure chronic ulcer of other part of right foot limited to breakdown of skin: Secondary | ICD-10-CM | POA: Diagnosis not present

## 2017-03-08 DIAGNOSIS — L97512 Non-pressure chronic ulcer of other part of right foot with fat layer exposed: Secondary | ICD-10-CM | POA: Diagnosis not present

## 2017-03-08 DIAGNOSIS — G40909 Epilepsy, unspecified, not intractable, without status epilepticus: Secondary | ICD-10-CM | POA: Diagnosis not present

## 2017-03-09 ENCOUNTER — Encounter: Payer: Self-pay | Admitting: Family Medicine

## 2017-03-09 DIAGNOSIS — N186 End stage renal disease: Secondary | ICD-10-CM | POA: Diagnosis not present

## 2017-03-09 DIAGNOSIS — N189 Chronic kidney disease, unspecified: Secondary | ICD-10-CM | POA: Insufficient documentation

## 2017-03-09 DIAGNOSIS — Z531 Procedure and treatment not carried out because of patient's decision for reasons of belief and group pressure: Secondary | ICD-10-CM | POA: Insufficient documentation

## 2017-03-09 DIAGNOSIS — R1310 Dysphagia, unspecified: Secondary | ICD-10-CM | POA: Insufficient documentation

## 2017-03-09 DIAGNOSIS — E119 Type 2 diabetes mellitus without complications: Secondary | ICD-10-CM | POA: Diagnosis not present

## 2017-03-09 DIAGNOSIS — D631 Anemia in chronic kidney disease: Secondary | ICD-10-CM | POA: Insufficient documentation

## 2017-03-09 DIAGNOSIS — Z90710 Acquired absence of both cervix and uterus: Secondary | ICD-10-CM | POA: Insufficient documentation

## 2017-03-09 DIAGNOSIS — T829XXA Unspecified complication of cardiac and vascular prosthetic device, implant and graft, initial encounter: Secondary | ICD-10-CM | POA: Insufficient documentation

## 2017-03-09 DIAGNOSIS — K5900 Constipation, unspecified: Secondary | ICD-10-CM | POA: Insufficient documentation

## 2017-03-09 DIAGNOSIS — Z992 Dependence on renal dialysis: Secondary | ICD-10-CM | POA: Diagnosis not present

## 2017-03-09 DIAGNOSIS — K219 Gastro-esophageal reflux disease without esophagitis: Secondary | ICD-10-CM | POA: Insufficient documentation

## 2017-03-09 DIAGNOSIS — N2581 Secondary hyperparathyroidism of renal origin: Secondary | ICD-10-CM | POA: Diagnosis not present

## 2017-03-09 DIAGNOSIS — E785 Hyperlipidemia, unspecified: Secondary | ICD-10-CM | POA: Insufficient documentation

## 2017-03-09 DIAGNOSIS — Z87891 Personal history of nicotine dependence: Secondary | ICD-10-CM | POA: Insufficient documentation

## 2017-03-09 DIAGNOSIS — G4730 Sleep apnea, unspecified: Secondary | ICD-10-CM | POA: Insufficient documentation

## 2017-03-09 DIAGNOSIS — Z7409 Other reduced mobility: Secondary | ICD-10-CM | POA: Insufficient documentation

## 2017-03-09 DIAGNOSIS — I6529 Occlusion and stenosis of unspecified carotid artery: Secondary | ICD-10-CM | POA: Insufficient documentation

## 2017-03-09 NOTE — Assessment & Plan Note (Signed)
MRI brain: multifocal white matter disease, several acute lacunar white matter infarcts in the left parietal region. Residual right hemiparesis & speech deficits from 2nd CVA in 2014.

## 2017-03-09 NOTE — Assessment & Plan Note (Signed)
Denies chest pain. Taking medications as prescribed without side effects.   Wt Readings from Last 3 Encounters:  02/01/17 156 lb 4.9 oz (70.9 kg)  01/28/17 150 lb (68 kg)  12/19/16 150 lb (68 kg)   Reports that she quit smoking about 22 months ago. Her smoking use included Cigarettes. She has a 12.75 pack-year smoking history.   BP Readings from Last 3 Encounters:  03/06/17 (!) 168/84  02/01/17 (!) 143/82  01/28/17 135/71   Lab Results  Component Value Date   CREATININE 6.98 (H) 02/01/2017

## 2017-03-09 NOTE — Assessment & Plan Note (Signed)
Requesting records from [facility]. Dialysis TTS.

## 2017-03-09 NOTE — Assessment & Plan Note (Addendum)
Will have evaluation completed for power wheelchair.

## 2017-03-09 NOTE — Assessment & Plan Note (Signed)
On Lipitor since 01/2017 hospitalization.  Lab Results  Component Value Date   CHOL 219 (H) 02/01/2017   HDL 70 02/01/2017   LDLCALC 129 (H) 02/01/2017   TRIG 99 02/01/2017   CHOLHDL 3.1 02/01/2017

## 2017-03-09 NOTE — Assessment & Plan Note (Signed)
Followed by Wound Care. Doxycyline started today. Foot bandaged and not examined by me today.

## 2017-03-09 NOTE — Assessment & Plan Note (Signed)
On Neurontin. Recently decreased due to sedation concerns.

## 2017-03-09 NOTE — Assessment & Plan Note (Signed)
Swallow study while hospitalized 01/2017. Mild aspiration risk. Recommend a regular diet with nectar thick liquids.  Patient should alternate food/liquids and sit up for at least an hour after any intake.

## 2017-03-09 NOTE — Assessment & Plan Note (Addendum)
Lab Results  Component Value Date   HGBA1C 7.6 (H) 02/01/2017   Unclear insulin regimen. Will clarify with pharmacy and have patient/CNA bring medications to office to review. She will meet with our PA/RD, Inda Coke to review diet and regimen

## 2017-03-09 NOTE — Assessment & Plan Note (Signed)
On Eliquis, HD dosing.

## 2017-03-09 NOTE — Assessment & Plan Note (Signed)
From DC (01/31/17):  TIA vs Recrudescence: Patient presented with concerns for worsening dysarthria and facial droop. The patient has a prior history of stroke with pre-existing dysarthria and facial droop deficits. It was thought that these deficits at worsened somewhat on her presentation. She was evaluated for acute stroke with CT and MRI which were negative. Her deficits mostly resolved on the day of discharge w/ dysarthria and facial droop back to baseline. She does have multiple risk factors. Stroke workup demonstrated carotid dopplers with moderate stenosis on the right ICA, low grade stenosis on the left. Speech pathology evaluated the pt and recommended dysphagia diet for pre-existing deficits of masticatory and pharyngeal muscles. Lipids panel showed mildly elevated total cholesterol w/o prior statin therapy. We have started atorvastatin on discharge and continue ASA 81mg  for secondary prophylaxis.

## 2017-03-09 NOTE — Assessment & Plan Note (Signed)
Hx of multiple grafts and fistulas in the upper extremities. Current access is right lower extremity. She will be following up with Vascular in May to review options.

## 2017-03-09 NOTE — Assessment & Plan Note (Signed)
History of remote pulmonary embolus and on Eliquis.

## 2017-03-09 NOTE — Assessment & Plan Note (Addendum)
Summary:  - Right - 60% to 79% ICA stenosis. Vertebral artery flow is   antegrade. - Left - 1% to 39 % ICA stenosis. Vertebral artery flow is   antegrade.

## 2017-03-09 NOTE — Assessment & Plan Note (Signed)
CBC Latest Ref Rng & Units 02/01/2017 01/31/2017 01/31/2017  WBC 4.0 - 10.5 K/uL 7.1 - 6.9  Hemoglobin 12.0 - 15.0 g/dL 9.5(L) 9.5(L) 9.0(L)  Hematocrit 36.0 - 46.0 % 29.9(L) 28.0(L) 28.5(L)  Platelets 150 - 400 K/uL 201 - 193

## 2017-03-11 ENCOUNTER — Ambulatory Visit (INDEPENDENT_AMBULATORY_CARE_PROVIDER_SITE_OTHER): Payer: Medicare Other | Admitting: Physician Assistant

## 2017-03-11 ENCOUNTER — Encounter: Payer: Self-pay | Admitting: Physician Assistant

## 2017-03-11 ENCOUNTER — Encounter: Payer: Self-pay | Admitting: Family Medicine

## 2017-03-11 ENCOUNTER — Ambulatory Visit: Payer: Medicare Other | Admitting: Physician Assistant

## 2017-03-11 VITALS — BP 130/90 | HR 74 | Resp 14 | Ht 66.0 in | Wt 153.0 lb

## 2017-03-11 DIAGNOSIS — Z992 Dependence on renal dialysis: Secondary | ICD-10-CM | POA: Diagnosis not present

## 2017-03-11 DIAGNOSIS — Z0000 Encounter for general adult medical examination without abnormal findings: Secondary | ICD-10-CM | POA: Diagnosis not present

## 2017-03-11 DIAGNOSIS — Z713 Dietary counseling and surveillance: Secondary | ICD-10-CM

## 2017-03-11 DIAGNOSIS — E1149 Type 2 diabetes mellitus with other diabetic neurological complication: Secondary | ICD-10-CM | POA: Diagnosis not present

## 2017-03-11 DIAGNOSIS — N186 End stage renal disease: Secondary | ICD-10-CM | POA: Diagnosis not present

## 2017-03-11 DIAGNOSIS — E1122 Type 2 diabetes mellitus with diabetic chronic kidney disease: Secondary | ICD-10-CM | POA: Diagnosis not present

## 2017-03-11 MED ORDER — ESOMEPRAZOLE MAGNESIUM 20 MG PO CPDR: 20.0000 mg | DELAYED_RELEASE_CAPSULE | Freq: Every day | ORAL | 3 refills | Status: AC

## 2017-03-11 NOTE — Progress Notes (Addendum)
Subjective:   Deveney Bayon is a 71 y.o. female who presents for an Initial Medicare Annual Wellness Visit.  Review of Systems    No ROS.  Medicare Wellness Visit.  Cardiac Risk Factors include: advanced age (>79men, >67 women);diabetes mellitus;dyslipidemia;hypertension;sedentary lifestyle   Sleep patterns: States she has difficulty falling asleep. Watches TV late into night. Does not like her bed. Does not nap during the day. States she sleeps 4 hrs/night. Daughter concerned about "sundowning." Sleeps during dialysis on dialysis days.   Home Safety/Smoke Alarms: Feels safe in home. Smoke alarms in place. Fire department recently out to assess home.   Living environment; residence and Firearm Safety: Lives with daughter, home aids in 7 days helping with ADL's. Uses wheelchair, has shower chair, hospital bed at home.    Seat Belt Safety/Bike Helmet: Wears seat belt.   Counseling:   Eye Exam- Last exam was in September. Vision effected by TIA. Blurry vision at baseline.  Dental- Overdue. Dental Resource list given.  Female:   Pap-  H/O hysterectomy. Last PAP over 5 years ago.     Mammo- Over 5 years ago. Declines repeat mammogram.       Dexa scan- Pt declines.       CCS-Pt declines.     Objective:    Today's Vitals   03/11/17 1207  BP: 130/90  Pulse: 74  Resp: 14  SpO2: 99%  Weight: 153 lb (69.4 kg)  Height: 5\' 6"  (1.676 m)   Body mass index is 24.69 kg/m.   Current Medications (verified) Outpatient Encounter Prescriptions as of 03/11/2017  Medication Sig  . Amino Acids-Protein Hydrolys (FEEDING SUPPLEMENT, PRO-STAT SUGAR FREE 64,) LIQD Take 30 mLs by mouth daily.  [name] apixaban (ELIQUIS) 2.5 MG TABS tablet Take 1 tablet (2.5 mg total) by mouth 2 (two) times daily.  [name] aspirin EC 81 MG tablet Take 1 tablet (81 mg total) by mouth daily.  [name] atorvastatin (LIPITOR) 40 MG tablet Take 1 tablet (40 mg total) by mouth daily.  . calcium acetate (PHOSLO) 667 MG capsule Take  667 mg by mouth 3 (three) times daily with meals.  . carvedilol (COREG) 3.125 MG tablet Take 1 tablet (3.125 mg total) by mouth 2 (two) times daily with a meal. (Patient taking differently: Take 3.125 mg by mouth 2 (two) times daily. Take 3.125 mg by mouth daily on Tuesday, Thursday and Saturday after dialysis. Take 3.125 mg by mouth twice daily on all other days)  . citalopram (CELEXA) 10 MG tablet Take 10 mg by mouth daily.  [name] doxycycline (VIBRAMYCIN) 100 MG capsule Take 1 capsule by mouth 2 (two) times daily.  [name] esomeprazole (NEXIUM) 20 MG capsule Take 1 capsule (20 mg total) by mouth daily.  . fluticasone (FLONASE) 50 MCG/ACT nasal spray Place 2 sprays into both nostrils daily.  [name] gabapentin (NEURONTIN) 100 MG capsule Take 1 capsule (100 mg total) by mouth at bedtime.  . insulin aspart (NOVOLOG) 100 UNIT/ML injection Inject 2-10 Units into the skin 3 (three) times daily as needed for high blood sugar. 151-200=2 units, 201-250=4 units, 251-300=6 units, 301-350=8 units, 351-400=10 units, >400=10 units and Call MD  . insulin glargine (LANTUS) 100 UNIT/ML injection Inject 2 Units into the skin as needed (Daughter states she only uses it as needed).   . multivitamin (RENA-VIT) TABS tablet Take 1 tablet by mouth daily.  . Nutritional Supplements (FEEDING SUPPLEMENT, NEPRO CARB STEADY,) LIQD Take 237 mLs by mouth daily.  . ondansetron (ZOFRAN) 4 MG tablet  Take 4 mg by mouth every 8 (eight) hours as needed for nausea or vomiting.  . [DISCONTINUED] esomeprazole (NEXIUM) 20 MG capsule Take 20 mg by mouth daily.   No facility-administered encounter medications on file as of 03/11/2017.     Allergies (verified) Penicillins; Amoxicillin; Latex; and Sulfa antibiotics   History: Past Medical History:  Diagnosis Date  . Anemia   . Anxiety   . CHF (congestive heart failure) ([place])   . Constipation   . Critical lower limb ischemia   . Depression   . ESRD on dialysis [facility])    "TTS; [place]"  (11/27/2016)  . GERD (gastroesophageal reflux disease)   . Heart murmur   . HLD (hyperlipidemia)   . Hypertension   . Memory loss   . Peripheral vascular disease ([place])   . Refusal of blood transfusions as patient is Jehovah's Witness   . Seizures ([place]) 04/2015   s/p CVA.  [name] Sleep apnea    Noncompliant with CPAP.  [name] Stroke ([place]) 2015  . Type II diabetes mellitus ([place])    Past Surgical History:  Procedure Laterality Date  . ABDOMINAL HYSTERECTOMY    . AMPUTATION Left 12/15/2015   Procedure: LEFT ABOVE KNEE AMPUTATION;  Surgeon: Serafina Mitchell, MD;  Location: [place];  Service: Vascular;  Laterality: Left;  . BASCILIC VEIN TRANSPOSITION Right 11/25/2015   Procedure: Right Arm FIRST STAGE BASILIC VEIN TRANSPOSITION;  Surgeon: Serafina Mitchell, MD;  Location: [place];  Service: Vascular;  Laterality: Right;  . BASCILIC VEIN TRANSPOSITION Right 01/19/2016   Procedure: RIGHT SECOND STAGE BASILIC VEIN TRANSPOSITION;  Surgeon: Serafina Mitchell, MD;  Location: [place];  Service: Vascular;  Laterality: Right;  . DILATION AND CURETTAGE OF UTERUS    . EYE SURGERY    . PERIPHERAL VASCULAR CATHETERIZATION Bilateral 11/28/2015   Procedure: Lower Extremity Angiography;  Surgeon: Lorretta Harp, MD;  Location: [place] CV LAB;  Service: Cardiovascular;  Laterality: Bilateral;  . PERIPHERAL VASCULAR CATHETERIZATION N/A 11/28/2015   Procedure: Abdominal Aortogram;  Surgeon: Lorretta Harp, MD;  Location: [place] CV LAB;  Service: Cardiovascular;  Laterality: N/A;  . PERIPHERAL VASCULAR CATHETERIZATION Right 11/27/2016  . PERIPHERAL VASCULAR CATHETERIZATION N/A 11/27/2016   Procedure: Abdominal Aortogram w/Lower Extremity;  Surgeon: Serafina Mitchell, MD;  Location: [place] CV LAB;  Service: Cardiovascular;  Laterality: N/A;  . PERIPHERAL VASCULAR CATHETERIZATION N/A 11/27/2016   Procedure: Right arm and central venogram;  Surgeon: Serafina Mitchell, MD;  Location: [place] CV LAB;  Service:  Cardiovascular;  Laterality: N/A;  . PERIPHERAL VASCULAR CATHETERIZATION Right 11/27/2016   Procedure: Peripheral Vascular Atherectomy;  Surgeon: Serafina Mitchell, MD;  Location: [place] CV LAB;  Service: Cardiovascular;  Laterality: Right;  Rt    sfa, popliteal, tibioperoneal trunk, peroneal  . PERIPHERAL VASCULAR CATHETERIZATION Right 11/27/2016   Procedure: Peripheral Vascular Balloon Angioplasty;  Surgeon: Serafina Mitchell, MD;  Location: [place] CV LAB;  Service: Cardiovascular;  Laterality: Right;  Tibial peroneal  . RETINOPATHY SURGERY Bilateral   . TOE AMPUTATION Right    2nd, 3rd, 4th, 5th digits  . TONSILLECTOMY     Family History  Problem Relation Age of Onset  . Asthma Mother   . Cancer Sister    Social History   Occupational History  . Not on file.   Social History Main Topics  . Smoking status: Former Smoker    Packs/day: 0.25    Years: 51.00    Types: Cigarettes  Quit date: 04/13/2015  . Smokeless tobacco: Never Used  . Alcohol use 0.0 oz/week     Comment: 11/27/2016 "stopped in 04/2015"  . Drug use: No  . Sexual activity: No    Tobacco Counseling Counseling given: Not Answered   Activities of Daily Living In your present state of health, do you have any difficulty performing the following activities: 03/11/2017 02/01/2017  Hearing? N N  Vision? N N  Difficulty concentrating or making decisions? Y N  Walking or climbing stairs? Y N  Dressing or bathing? Y N  Doing errands, shopping? Tempie Donning  Preparing Food and eating ? Y -  Using the Toilet? N -  In the past six months, have you accidently leaked urine? Y -  Do you have problems with loss of bowel control? N -  Managing your Medications? Y -  Managing your Finances? Y -  Housekeeping or managing your Housekeeping? Y -  Some recent data might be hidden    Immunizations and Health Maintenance There is no immunization history for the selected administration types on file for this patient. Health  Maintenance Due  Topic Date Due  . Hepatitis C Screening  06/30/1946  . FOOT EXAM  01/15/1955  . OPHTHALMOLOGY EXAM  01/15/1955  . URINE MICROALBUMIN  01/15/1955  . TETANUS/TDAP  01/15/1964  . MAMMOGRAM  01/15/1995  . COLONOSCOPY  01/15/1995  . DEXA SCAN  01/14/2010  . PNA vac Low Risk Adult (1 of 2 - PCV13) 01/14/2010    Patient Care Team: Briscoe Deutscher, DO as PCP - General (Family Medicine) Serafina Mitchell, MD as Consulting Physician (Vascular Surgery) Ricard Dillon, MD as Consulting Physician (Internal Medicine) Trula Slade, DPM as Consulting Physician (Podiatry)  Indicate any recent Medical Services you may have received from other than [facility] providers in the past year (date may be approximate).     Assessment:   This is a routine wellness examination for Celestine. Physical assessment deferred to PCP.  Hearing/Vision screen Hearing Screening Comments: Pt reports deafness to right ear. States she has hearing aid for the left year. Does not wear them. Goes to [employer] on [address]. Daughter will call to see if they can go there today to see about a new hearing aid.  Vision Screening Comments: Last exam was in September. Vision effected by TIA. Blurry vision at baseline.   Dietary issues and exercise activities discussed: Current Exercise Habits: Home exercise routine, Type of exercise: strength training/weights, Time (Minutes): 60, Frequency (Times/Week): 7, Weekly Exercise (Minutes/Week): 420, Intensity: Mild, Exercise limited by: orthopedic condition(s)   Diet (meal preparation, eat out, water intake, caffeinated beverages, dairy products, fruits and vegetables): Eats 3 meals and 2 snacks. Drinks 1 cup coffee/day. 8oz water for lunch and dinner or depro. Breakfast: Eggs, toast sometimes bacon (usually low sodium). Sometimes cream of wheat. Lunch: Chicken or roast, vegetables Dinner:  Roast and vegetables. Usually meat and vegetable and starch.   Goals    .  Be able to walk with prosthetic      Depression Screen PHQ 2/9 Scores 03/11/2017  PHQ - 2 Score 0  PHQ- 9 Score 2    Fall Risk Fall Risk  03/11/2017  Falls in the past year? Yes  Number falls in past yr: 2 or more  Injury with Fall? No  Risk Factor Category  High Fall Risk  Risk for fall due to : Impaired balance/gait;Impaired mobility;Impaired vision  Follow up Education provided;Falls prevention discussed  Cognitive Function: MMSE - Mini Mental State Exam 03/11/2017  Orientation to time 5  Orientation to Place 5  Registration 3  Attention/ Calculation 4  Recall 2  Language- name 2 objects 2  Language- repeat 1  Language- follow 3 step command 3  Language- read & follow direction 1  Write a sentence 1  Copy design 1  Total score 28        Screening Tests Health Maintenance  Topic Date Due  . Hepatitis C Screening  September 13, 1946  . FOOT EXAM  01/15/1955  . OPHTHALMOLOGY EXAM  01/15/1955  . URINE MICROALBUMIN  01/15/1955  . TETANUS/TDAP  01/15/1964  . MAMMOGRAM  01/15/1995  . COLONOSCOPY  01/15/1995  . DEXA SCAN  01/14/2010  . PNA vac Low Risk Adult (1 of 2 - PCV13) 01/14/2010  . INFLUENZA VACCINE  06/12/2017  . HEMOGLOBIN A1C  08/04/2017      Plan:    Bring a copy of your advance directives to your next office visit.  I have personally reviewed and noted the following in the patient's chart:   . Medical and social history . Use of alcohol, tobacco or illicit drugs  . Current medications and supplements . Functional ability and status . Nutritional status  . Physical activity . Advanced directives - advised to bring in copy and to schedule Advance Directive discussion is they would like. . List of other physicians . Vitals . Screenings to include cognitive, depression, and falls . Referrals and appointments  In addition, I have reviewed and discussed with patient certain preventive protocols, quality metrics, and best practice recommendations. A  written personalized care plan for preventive services as well as general preventive health recommendations were provided to patient.     Ree Edman, RN   03/11/2017    I have personally reviewed the Medicare Annual Wellness questionnaire and have noted 1. The patient's medical and social history 2. Their use of alcohol, tobacco or illicit drugs 3. Their current medications and supplements 4. The patient's functional ability including ADL's, fall risks, home safety risks and hearing or visual impairment. 5. Diet and physical activities 6. Evidence for depression or mood disorders 7. Reviewed Updated provider list, see scanned forms and [facility] Snapshot.   The patients weight, height, BMI and visual acuity have been recorded in the chart I have made referrals, counseling and provided education to the patient based review of the above and I have provided the pt with a written personalized care plan for preventive services.  I have provided the patient with a copy of your personalized plan for preventive services. Instructed to take the time to review along with their updated medication list.  Briscoe Deutscher, D.O. [place], [facility]

## 2017-03-11 NOTE — Progress Notes (Deleted)
Subjective:   Deborah Hendricks is a 71 y.o. female who presents for an Initial Medicare Annual Wellness Visit.  Review of Systems    No ROS.  Medicare Wellness Visit.    Sleep patterns: Home Safety/Smoke Alarms:   Living environment; residence and Firearm Safety: Seat Belt Safety/Bike Helmet: Wears seat belt.   Counseling:   Eye Exam-  Dental-  Female:   Pap-       Mammo-       Dexa scan-        CCS-     Objective:    There were no vitals filed for this visit. There is no height or weight on file to calculate BMI.   Current Medications (verified) Outpatient Encounter Prescriptions as of 03/11/2017  Medication Sig  . Amino Acids-Protein Hydrolys (FEEDING SUPPLEMENT, PRO-STAT SUGAR FREE 64,) LIQD Take 30 mLs by mouth daily.  [name] apixaban (ELIQUIS) 2.5 MG TABS tablet Take 1 tablet (2.5 mg total) by mouth 2 (two) times daily.  [name] aspirin EC 81 MG tablet Take 1 tablet (81 mg total) by mouth daily.  [name] atorvastatin (LIPITOR) 40 MG tablet Take 1 tablet (40 mg total) by mouth daily.  . calcium acetate (PHOSLO) 667 MG capsule Take 667 mg by mouth 3 (three) times daily with meals.  . carvedilol (COREG) 3.125 MG tablet Take 1 tablet (3.125 mg total) by mouth 2 (two) times daily with a meal. (Patient taking differently: Take 3.125 mg by mouth 2 (two) times daily. Take 3.125 mg by mouth daily on Tuesday, Thursday and Saturday after dialysis. Take 3.125 mg by mouth twice daily on all other days)  . citalopram (CELEXA) 10 MG tablet Take 10 mg by mouth daily.  [name] doxycycline (VIBRAMYCIN) 100 MG capsule Take 1 capsule by mouth 2 (two) times daily.  [name] esomeprazole (NEXIUM) 20 MG capsule Take 20 mg by mouth daily.  . fluticasone (FLONASE) 50 MCG/ACT nasal spray Place 2 sprays into both nostrils daily.  [name] gabapentin (NEURONTIN) 100 MG capsule Take 1 capsule (100 mg total) by mouth at bedtime.  . insulin aspart (NOVOLOG) 100 UNIT/ML injection Inject 2-10 Units into the skin 3 (three) times daily  as needed for high blood sugar. 151-200=2 units, 201-250=4 units, 251-300=6 units, 301-350=8 units, 351-400=10 units, >400=10 units and Call MD  . insulin glargine (LANTUS) 100 UNIT/ML injection Inject 2 Units into the skin daily.  . multivitamin (RENA-VIT) TABS tablet Take 1 tablet by mouth daily.  . Nutritional Supplements (FEEDING SUPPLEMENT, NEPRO CARB STEADY,) LIQD Take 237 mLs by mouth daily.  . ondansetron (ZOFRAN) 4 MG tablet Take 4 mg by mouth every 8 (eight) hours as needed for nausea or vomiting.   No facility-administered encounter medications on file as of 03/11/2017.     Allergies (verified) Penicillins; Amoxicillin; Latex; and Sulfa antibiotics   History: Past Medical History:  Diagnosis Date  . Anemia   . Anxiety   . CHF (congestive heart failure) ([place])   . Constipation   . Critical lower limb ischemia   . Depression   . ESRD on dialysis [facility])    "TTS; [place]" (11/27/2016)  . GERD (gastroesophageal reflux disease)   . Heart murmur   . HLD (hyperlipidemia)   . Hypertension   . Memory loss   . Peripheral vascular disease ([place])   . Refusal of blood transfusions as patient is Jehovah's Witness   . Seizures ([place]) 04/2015   s/p CVA.  [name] Sleep apnea    Noncompliant with  CPAP.  . Stroke ([place]) 2015  . Type II diabetes mellitus ([place])    Past Surgical History:  Procedure Laterality Date  . ABDOMINAL HYSTERECTOMY    . AMPUTATION Left 12/15/2015   Procedure: LEFT ABOVE KNEE AMPUTATION;  Surgeon: Serafina Mitchell, MD;  Location: [place];  Service: Vascular;  Laterality: Left;  . BASCILIC VEIN TRANSPOSITION Right 11/25/2015   Procedure: Right Arm FIRST STAGE BASILIC VEIN TRANSPOSITION;  Surgeon: Serafina Mitchell, MD;  Location: [place];  Service: Vascular;  Laterality: Right;  . BASCILIC VEIN TRANSPOSITION Right 01/19/2016   Procedure: RIGHT SECOND STAGE BASILIC VEIN TRANSPOSITION;  Surgeon: Serafina Mitchell, MD;  Location: [place];  Service: Vascular;  Laterality: Right;  . DILATION  AND CURETTAGE OF UTERUS    . EYE SURGERY    . PERIPHERAL VASCULAR CATHETERIZATION Bilateral 11/28/2015   Procedure: Lower Extremity Angiography;  Surgeon: Lorretta Harp, MD;  Location: [place] CV LAB;  Service: Cardiovascular;  Laterality: Bilateral;  . PERIPHERAL VASCULAR CATHETERIZATION N/A 11/28/2015   Procedure: Abdominal Aortogram;  Surgeon: Lorretta Harp, MD;  Location: [place] CV LAB;  Service: Cardiovascular;  Laterality: N/A;  . PERIPHERAL VASCULAR CATHETERIZATION Right 11/27/2016  . PERIPHERAL VASCULAR CATHETERIZATION N/A 11/27/2016   Procedure: Abdominal Aortogram w/Lower Extremity;  Surgeon: Serafina Mitchell, MD;  Location: [place] CV LAB;  Service: Cardiovascular;  Laterality: N/A;  . PERIPHERAL VASCULAR CATHETERIZATION N/A 11/27/2016   Procedure: Right arm and central venogram;  Surgeon: Serafina Mitchell, MD;  Location: [place] CV LAB;  Service: Cardiovascular;  Laterality: N/A;  . PERIPHERAL VASCULAR CATHETERIZATION Right 11/27/2016   Procedure: Peripheral Vascular Atherectomy;  Surgeon: Serafina Mitchell, MD;  Location: [place] CV LAB;  Service: Cardiovascular;  Laterality: Right;  Rt    sfa, popliteal, tibioperoneal trunk, peroneal  . PERIPHERAL VASCULAR CATHETERIZATION Right 11/27/2016   Procedure: Peripheral Vascular Balloon Angioplasty;  Surgeon: Serafina Mitchell, MD;  Location: [place] CV LAB;  Service: Cardiovascular;  Laterality: Right;  Tibial peroneal  . RETINOPATHY SURGERY Bilateral   . TOE AMPUTATION Right    2nd, 3rd, 4th, 5th digits  . TONSILLECTOMY     Family History  Problem Relation Age of Onset  . Asthma Mother   . Cancer Sister    Social History   Occupational History  . Not on file.   Social History Main Topics  . Smoking status: Former Smoker    Packs/day: 0.25    Years: 51.00    Types: Cigarettes    Quit date: 04/13/2015  . Smokeless tobacco: Never Used  . Alcohol use 0.0 oz/week     Comment: 11/27/2016 "stopped in 04/2015"  .  Drug use: No  . Sexual activity: No    Tobacco Counseling Counseling given: Not Answered   Activities of Daily Living In your present state of health, do you have any difficulty performing the following activities: 02/01/2017 11/27/2016  Hearing? N -  Vision? N -  Difficulty concentrating or making decisions? N -  Walking or climbing stairs? N -  Dressing or bathing? N -  Doing errands, shopping? Y Y  Some recent data might be hidden    Immunizations and Health Maintenance There is no immunization history for the selected administration types on file for this patient. Health Maintenance Due  Topic Date Due  . Hepatitis C Screening  10-12-1946  . FOOT EXAM  01/15/1955  . OPHTHALMOLOGY EXAM  01/15/1955  . URINE MICROALBUMIN  01/15/1955  . TETANUS/TDAP  01/15/1964  . MAMMOGRAM  01/15/1995  . COLONOSCOPY  01/15/1995  . DEXA SCAN  01/14/2010  . PNA vac Low Risk Adult (1 of 2 - PCV13) 01/14/2010    Patient Care Team: Briscoe Deutscher, DO as PCP - General (Family Medicine)  Indicate any recent Medical Services you may have received from other than [facility] providers in the past year (date may be approximate).     Assessment:   This is a routine wellness examination for Deborah Hendricks. Physical assessment deferred to PCP.  Hearing/Vision screen No exam data present  Dietary issues and exercise activities discussed:   Diet (meal preparation, eat out, water intake, caffeinated beverages, dairy products, fruits and vegetables):  Breakfast: Lunch:  Dinner:      Goals    None     Depression Screen No flowsheet data found.  Fall Risk No flowsheet data found.  Cognitive Function:        Screening Tests Health Maintenance  Topic Date Due  . Hepatitis C Screening  November 25, 1945  . FOOT EXAM  01/15/1955  . OPHTHALMOLOGY EXAM  01/15/1955  . URINE MICROALBUMIN  01/15/1955  . TETANUS/TDAP  01/15/1964  . MAMMOGRAM  01/15/1995  . COLONOSCOPY  01/15/1995  . DEXA SCAN  01/14/2010  .  PNA vac Low Risk Adult (1 of 2 - PCV13) 01/14/2010  . INFLUENZA VACCINE  06/12/2017  . HEMOGLOBIN A1C  08/04/2017      Plan:     I have personally reviewed and noted the following in the patient's chart:   . Medical and social history . Use of alcohol, tobacco or illicit drugs  . Current medications and supplements . Functional ability and status . Nutritional status . Physical activity . Advanced directives . List of other physicians . Vitals . Screenings to include cognitive, depression, and falls . Referrals and appointments  In addition, I have reviewed and discussed with patient certain preventive protocols, quality metrics, and best practice recommendations. A written personalized care plan for preventive services as well as general preventive health recommendations were provided to patient.     Ree Edman, RN   03/11/2017

## 2017-03-11 NOTE — Progress Notes (Deleted)
Deborah Hendricks is a 71 y.o. female is here to discuss: Nutrition  I acted as a Education administrator for [employer], PA-C Anselmo Pickler, LPN  History of Present Illness:   No chief complaint on file.   HPI  Health Maintenance Due  Topic Date Due  . Hepatitis C Screening  1945/11/15  . FOOT EXAM  01/15/1955  . OPHTHALMOLOGY EXAM  01/15/1955  . URINE MICROALBUMIN  01/15/1955  . TETANUS/TDAP  01/15/1964  . MAMMOGRAM  01/15/1995  . COLONOSCOPY  01/15/1995  . DEXA SCAN  01/14/2010  . PNA vac Low Risk Adult (1 of 2 - PCV13) 01/14/2010    PMHx, SurgHx, SocialHx, FamHx, Medications, and Allergies were reviewed in the Visit Navigator and updated as appropriate.   Patient Active Problem List   Diagnosis Date Noted  . History of hysterectomy 03/09/2017  . Former smoker 03/09/2017  . Dysphagia 03/09/2017  . Carotid stenosis 03/09/2017  . Need for assistance due to reduced mobility 03/09/2017  . Complication of vascular access for dialysis 03/09/2017  . Anemia in chronic kidney disease (CKD) 03/09/2017  . Sleep apnea   . Refusal of blood transfusions as patient is Jehovah's Witness   . Constipation   . GERD (gastroesophageal reflux disease)   . HLD (hyperlipidemia)   . TIA (transient ischemic attack) 02/01/2017  . Diabetic ulcer of toe of right foot associated with diabetes mellitus due to underlying condition, limited to breakdown of skin ([place])   . PVD (peripheral vascular disease) ([place]) 11/27/2016  . Blurry vision   . Asymmetric SNHL (sensorineural hearing loss) 06/12/2016  . Peripheral neuropathy 06/11/2016  . History of pulmonary embolism 06/08/2016  . History of CVA with residual deficit 06/08/2016  . History of borderline personality disorder 04/19/2016  . History of medication noncompliance 04/19/2016  . Paroxysmal atrial fibrillation ([place]) 04/19/2016  . Seizure disorder ([place]) 04/19/2016  . Renovascular hypertension 04/13/2016  . ESRD on dialysis ([place]) 04/11/2016  .  Generalized anxiety disorder 04/11/2016  . Major depression, recurrent, chronic ([employer]) 04/11/2016  . S/P AKA (above knee amputation) unilateral, left ([place]) 04/11/2016  . PAD (peripheral artery disease) ([place]) 12/15/2015  . Type II diabetes mellitus with neurological manifestations ([place]) 10/21/2015  . Seizures ([place]) 04/13/2015    Social History  Substance Use Topics  . Smoking status: Former Smoker    Packs/day: 0.25    Years: 51.00    Types: Cigarettes    Quit date: 04/13/2015  . Smokeless tobacco: Never Used  . Alcohol use 0.0 oz/week     Comment: 11/27/2016 "stopped in 04/2015"    Current Medications and Allergies:    Current Outpatient Prescriptions:  .  Amino Acids-Protein Hydrolys (FEEDING SUPPLEMENT, PRO-STAT SUGAR FREE 64,) LIQD, Take 30 mLs by mouth daily., Disp: 900 mL, Rfl: 0 .  apixaban (ELIQUIS) 2.5 MG TABS tablet, Take 1 tablet (2.5 mg total) by mouth 2 (two) times daily., Disp: 60 tablet, Rfl: 3 .  aspirin EC 81 MG tablet, Take 1 tablet (81 mg total) by mouth daily., Disp: 60 tablet, Rfl: 1 .  atorvastatin (LIPITOR) 40 MG tablet, Take 1 tablet (40 mg total) by mouth daily., Disp: 30 tablet, Rfl: 3 .  calcium acetate (PHOSLO) 667 MG capsule, Take 667 mg by mouth 3 (three) times daily with meals., Disp: , Rfl:  .  carvedilol (COREG) 3.125 MG tablet, Take 1 tablet (3.125 mg total) by mouth 2 (two) times daily with a meal. (Patient taking differently: Take 3.125 mg by mouth 2 (two)  times daily. Take 3.125 mg by mouth daily on Tuesday, Thursday and Saturday after dialysis. Take 3.125 mg by mouth twice daily on all other days), Disp: 60 tablet, Rfl: 1 .  citalopram (CELEXA) 10 MG tablet, Take 10 mg by mouth daily., Disp: , Rfl: 3 .  doxycycline (VIBRAMYCIN) 100 MG capsule, Take 1 capsule by mouth 2 (two) times daily., Disp: , Rfl: 0 .  esomeprazole (NEXIUM) 20 MG capsule, Take 20 mg by mouth daily., Disp: , Rfl: 3 .  fluticasone (FLONASE) 50 MCG/ACT nasal spray, Place 2 sprays into  both nostrils daily., Disp: , Rfl:  .  gabapentin (NEURONTIN) 100 MG capsule, Take 1 capsule (100 mg total) by mouth at bedtime., Disp: 30 capsule, Rfl: 0 .  insulin aspart (NOVOLOG) 100 UNIT/ML injection, Inject 2-10 Units into the skin 3 (three) times daily as needed for high blood sugar. 151-200=2 units, 201-250=4 units, 251-300=6 units, 301-350=8 units, 351-400=10 units, >400=10 units and Call MD, Disp: , Rfl:  .  insulin glargine (LANTUS) 100 UNIT/ML injection, Inject 2 Units into the skin daily., Disp: , Rfl:  .  multivitamin (RENA-VIT) TABS tablet, Take 1 tablet by mouth daily., Disp: , Rfl:  .  Nutritional Supplements (FEEDING SUPPLEMENT, NEPRO CARB STEADY,) LIQD, Take 237 mLs by mouth daily., Disp: 237 mL, Rfl: 12 .  ondansetron (ZOFRAN) 4 MG tablet, Take 4 mg by mouth every 8 (eight) hours as needed for nausea or vomiting., Disp: , Rfl:   Allergies  Allergen Reactions  . Penicillins Hives, Itching, Rash and Other (See Comments)    Has patient had a PCN reaction causing immediate rash, facial/tongue/throat swelling, SOB or lightheadedness with hypotension: Yes Has patient had a PCN reaction causing severe rash involving mucus membranes or skin necrosis:  Has patient had a PCN reaction that required hospitalization  Has patient had a PCN reaction occurring within the last 10 years:  If all of the above answers are "NO", then may proceed with Cephalosporin use.  [name] Amoxicillin Hives, Itching and Rash  . Latex Rash  . Sulfa Antibiotics Other (See Comments)    Unknown     Review of Systems   ROS  Vitals:  There were no vitals filed for this visit.   There is no height or weight on file to calculate BMI.   Physical Exam:    Physical Exam   Assessment and Plan:    There are no diagnoses linked to this encounter.  . Reviewed expectations re: course of current medical issues. . Discussed self-management of symptoms. . Outlined signs and symptoms indicating need for more  acute intervention. . Patient verbalized understanding and all questions were answered. . See orders for this visit as documented in the electronic medical record. . Patient received an After Visit Summary.  CMA or LPN served as scribe during this visit. History, Physical, and Plan performed by medical provider. Documentation and orders reviewed and attested to.  Inda Coke, PA-C [place], [facility] 03/11/2017  Follow-up: No Follow-up on file.

## 2017-03-11 NOTE — Progress Notes (Addendum)
Deborah Hendricks is a 71 y.o. female is here to discuss: Nutrition  I acted as a Education administrator for [employer], PA-C Anselmo Pickler, LPN  History of Present Illness:   Chief Complaint  Patient presents with  . Medicare Wellness  . Nutrition Consult    HPI   Patient is here to discuss blood sugar control and renal diet.  Patient is accompanied by her daughter and CMA.  "Bottoms out" if blood sugar gets too low.  She is on a sliding scale of 2-5 units of lantus. Blood sugar of 150 - 200 --> she gets 2 units. She rarely gets more than 2 units, unless she has a "cheat snack of birthday cake or something."  Lab Results  Component Value Date   HGBA1C 7.6 (H) 02/01/2017   Dietary recall: Wakes up between 7:30-8 Breakfast (8:30): coffee, eggs and toast (1 slice toast) HD snack: Nepro shake, rice cake, apple Lunch (12-12:30): meat and veggie, water Dinner (8-9p): protein with baked/boiled/fried Snacks: 100 cal popcorn, cheese crackers Beverages: no juices or sodas, mostly water and Nepro  Fluid restriction: 3 x 8 oz daily Goal: 2 Nepros daily  Weight: Wt Readings from Last 3 Encounters:  03/11/17 153 lb (69.4 kg)  02/01/17 156 lb 4.9 oz (70.9 kg)  01/28/17 150 lb (68 kg)   Exercise: PT 3 x week, starting this week; she has a wound to her R foot which prevents her from standing on her foot  Support system: Daughter and CMA Stroke in middle of 2015. Spent two years in a nursing home. Now lives with daughter.  Goals: 1- Get HgbA1c under control 2- Get protein up 3- Get off insulin  Estimated daily energy needs: Calories: 1725 - 2000 kcal Protein: 85-100 g Fluid: per nephrology   Health Maintenance Due  Topic Date Due  . Hepatitis C Screening  [birth year]  . FOOT EXAM  01/15/1955  . OPHTHALMOLOGY EXAM  01/15/1955  . URINE MICROALBUMIN  01/15/1955  . TETANUS/TDAP  01/15/1964  . MAMMOGRAM  01/15/1995  . COLONOSCOPY  01/15/1995  . DEXA SCAN  01/14/2010  . PNA  vac Low Risk Adult (1 of 2 - PCV13) 01/14/2010    PMHx, SurgHx, SocialHx, FamHx, Medications, and Allergies were reviewed in the Visit Navigator and updated as appropriate.   Patient Active Problem List   Diagnosis Date Noted  . History of hysterectomy 03/09/2017  . Former smoker 03/09/2017  . Dysphagia 03/09/2017  . Carotid stenosis 03/09/2017  . Need for assistance due to reduced mobility 03/09/2017  . Complication of vascular access for dialysis 03/09/2017  . Anemia in chronic kidney disease (CKD) 03/09/2017  . Sleep apnea   . Refusal of blood transfusions as patient is Jehovah's Witness   . Constipation   . GERD (gastroesophageal reflux disease)   . HLD (hyperlipidemia)   . TIA (transient ischemic attack) 02/01/2017  . Diabetic ulcer of toe of right foot associated with diabetes mellitus due to underlying condition, limited to breakdown of skin ([place])   . PVD (peripheral vascular disease) ([place]) 11/27/2016  . Blurry vision   . Asymmetric SNHL (sensorineural hearing loss) 06/12/2016  . Peripheral neuropathy 06/11/2016  . History of pulmonary embolism 06/08/2016  . History of CVA with residual deficit 06/08/2016  . History of borderline personality disorder 04/19/2016  . History of medication noncompliance 04/19/2016  . Paroxysmal atrial fibrillation ([place]) 04/19/2016  . Seizure disorder ([place]) 04/19/2016  . Renovascular hypertension 04/13/2016  . ESRD on dialysis ([place]) 04/11/2016  .  Generalized anxiety disorder 04/11/2016  . Major depression, recurrent, chronic ([place]) 04/11/2016  . S/P AKA (above knee amputation) unilateral, left ([place]) 04/11/2016  . PAD (peripheral artery disease) ([place]) 12/15/2015  . Type II diabetes mellitus with neurological manifestations ([place]) 10/21/2015  . Seizures ([employer]) 04/13/2015    Social History  Substance Use Topics  . Smoking status: Former Smoker    Packs/day: 0.25    Years: 51.00    Types: Cigarettes    Quit date: 04/13/2015  . Smokeless  tobacco: Never Used  . Alcohol use 0.0 oz/week     Comment: 11/27/2016 "stopped in 04/2015"    Current Medications and Allergies:    Current Outpatient Prescriptions:  .  Amino Acids-Protein Hydrolys (FEEDING SUPPLEMENT, PRO-STAT SUGAR FREE 64,) LIQD, Take 30 mLs by mouth daily., Disp: 900 mL, Rfl: 0 .  apixaban (ELIQUIS) 2.5 MG TABS tablet, Take 1 tablet (2.5 mg total) by mouth 2 (two) times daily., Disp: 60 tablet, Rfl: 3 .  aspirin EC 81 MG tablet, Take 1 tablet (81 mg total) by mouth daily., Disp: 60 tablet, Rfl: 1 .  atorvastatin (LIPITOR) 40 MG tablet, Take 1 tablet (40 mg total) by mouth daily., Disp: 30 tablet, Rfl: 3 .  calcium acetate (PHOSLO) 667 MG capsule, Take 667 mg by mouth 3 (three) times daily with meals., Disp: , Rfl:  .  carvedilol (COREG) 3.125 MG tablet, Take 1 tablet (3.125 mg total) by mouth 2 (two) times daily with a meal. (Patient taking differently: Take 3.125 mg by mouth 2 (two) times daily. Take 3.125 mg by mouth daily on Tuesday, Thursday and Saturday after dialysis. Take 3.125 mg by mouth twice daily on all other days), Disp: 60 tablet, Rfl: 1 .  citalopram (CELEXA) 10 MG tablet, Take 10 mg by mouth daily., Disp: , Rfl: 3 .  doxycycline (VIBRAMYCIN) 100 MG capsule, Take 1 capsule by mouth 2 (two) times daily., Disp: , Rfl: 0 .  esomeprazole (NEXIUM) 20 MG capsule, Take 1 capsule (20 mg total) by mouth daily., Disp: 90 capsule, Rfl: 3 .  fluticasone (FLONASE) 50 MCG/ACT nasal spray, Place 2 sprays into both nostrils daily., Disp: , Rfl:  .  gabapentin (NEURONTIN) 100 MG capsule, Take 1 capsule (100 mg total) by mouth at bedtime., Disp: 30 capsule, Rfl: 0 .  insulin aspart (NOVOLOG) 100 UNIT/ML injection, Inject 2-10 Units into the skin 3 (three) times daily as needed for high blood sugar. 151-200=2 units, 201-250=4 units, 251-300=6 units, 301-350=8 units, 351-400=10 units, >400=10 units and Call MD, Disp: , Rfl:  .  insulin glargine (LANTUS) 100 UNIT/ML injection,  Inject 2 Units into the skin as needed (Daughter states she only uses it as needed). , Disp: , Rfl:  .  multivitamin (RENA-VIT) TABS tablet, Take 1 tablet by mouth daily., Disp: , Rfl:  .  Nutritional Supplements (FEEDING SUPPLEMENT, NEPRO CARB STEADY,) LIQD, Take 237 mLs by mouth daily., Disp: 237 mL, Rfl: 12 .  ondansetron (ZOFRAN) 4 MG tablet, Take 4 mg by mouth every 8 (eight) hours as needed for nausea or vomiting., Disp: , Rfl:    Allergies  Allergen Reactions  . Penicillins Hives, Itching, Rash and Other (See Comments)    Has patient had a PCN reaction causing immediate rash, facial/tongue/throat swelling, SOB or lightheadedness with hypotension: Yes Has patient had a PCN reaction causing severe rash involving mucus membranes or skin necrosis:  Has patient had a PCN reaction that required hospitalization  Has patient had a PCN reaction occurring within  the last 10 years:  If all of the above answers are "NO", then may proceed with Cephalosporin use.  [name] Amoxicillin Hives, Itching and Rash  . Latex Rash  . Sulfa Antibiotics Other (See Comments)    Unknown     Review of Systems   Review of Systems  Constitutional: Negative for chills, fever and weight loss.  Neurological: Negative for dizziness and speech change.  Endo/Heme/Allergies: Negative for polydipsia.      Vitals:   Vitals:   03/11/17 1207  BP: 130/90  Pulse: 74  Resp: 14  SpO2: 99%  Weight: 153 lb (69.4 kg)  Height: 5\' 6"  (1.676 m)     Body mass index is 24.69 kg/m.   Physical Exam:    Physical Exam  Constitutional: She appears well-developed. She is cooperative.  Non-toxic appearance. She does not have a sickly appearance. She does not appear ill. No distress.  Cardiovascular: Normal rate, regular rhythm, S1 normal, S2 normal, normal heart sounds and normal pulses.   No LE edema  Pulmonary/Chest: Effort normal and breath sounds normal.  Musculoskeletal:  L AKA  Neurological: She is alert.  Nursing  note and vitals reviewed.      Assessment and Plan:    Jilleen was seen today for medicare wellness and nutrition consult.  Diagnoses and all orders for this visit:  Nutritional counseling, Type II diabetes mellitus with neurological manifestations, & ESRD on dialysis Provided nutritional counseling with patient, daughter, and CMA present. We reviewed her daily diet intake as well as recommendations from her dialysis center. We reviewed her most recent labs from HD and discussed need for increased protein intake and better blood sugar control. Provided specific meal recommendations. We discussed [email or web address] to use for recipe ideas and other meal planning tips.  I spent 25 minutes with this patient, greater than 50% was face-to-face time counseling regarding the above diagnoses.  . Reviewed expectations re: course of current medical issues. . Discussed self-management of symptoms. . Outlined signs and symptoms indicating need for more acute intervention. . Patient verbalized understanding and all questions were answered. . See orders for this visit as documented in the electronic medical record. . Patient received an After Visit Summary.  CMA or LPN served as scribe during this visit. History, Physical, and Plan performed by medical provider. Documentation and orders reviewed and attested to.  Inda Coke, PA-C [place], [facility] 03/11/2017  Follow-up: Return in about 1 year (around 03/11/2018).

## 2017-03-11 NOTE — Progress Notes (Signed)
Pre visit review using our clinic review tool, if applicable. No additional management support is needed unless otherwise documented below in the visit note. 

## 2017-03-11 NOTE — Progress Notes (Deleted)
Pre visit review using our clinic review tool, if applicable. No additional management support is needed unless otherwise documented below in the visit note. 

## 2017-03-11 NOTE — Patient Instructions (Addendum)
Bring a copy of your advance directives to your next office visit.  Advance Directive Advance directives are legal documents that let you make choices ahead of time about your health care and medical treatment in case you become unable to communicate for yourself. Advance directives are a way for you to communicate your wishes to family, friends, and health care providers. This can help convey your decisions about end-of-life care if you become unable to communicate. Discussing and writing advance directives should happen over time rather than all at once. Advance directives can be changed depending on your situation and what you want, even after you have signed the advance directives. If you do not have an advance directive, some states assign family decision makers to act on your behalf based on how closely you are related to them. Each state has its own laws regarding advance directives. You may want to check with your health care provider, attorney, or state representative about the laws in your state. There are different types of advance directives, such as:  Medical power of attorney.  Living will.  Do not resuscitate (DNR) or do not attempt resuscitation (DNAR) order. Health care proxy and medical power of attorney A health care proxy, also called a health care agent, is a person who is appointed to make medical decisions for you in cases in which you are unable to make the decisions yourself. Generally, people choose someone they know well and trust to represent their preferences. Make sure to ask this person for an agreement to act as your proxy. A proxy may have to exercise judgment in the event of a medical decision for which your wishes are not known. A medical power of attorney is a legal document that names your health care proxy. Depending on the laws in your state, after the document is written, it may also need to  be:  Signed.  Notarized.  Dated.  Copied.  Witnessed.  Incorporated into your medical record. You may also want to appoint someone to manage your financial affairs in a situation in which you are unable to do so. This is called a durable power of attorney for finances. It is a separate legal document from the durable power of attorney for health care. You may choose the same person or someone different from your health care proxy to act as your agent in financial matters. If you do not appoint a proxy, or if there is a concern that the proxy is not acting in your best interests, a court-appointed guardian may be designated to act on your behalf. Living will A living will is a set of instructions documenting your wishes about medical care when you cannot express them yourself. Health care providers should keep a copy of your living will in your medical record. You may want to give a copy to family members or friends. To alert caregivers in case of an emergency, you can place a card in your wallet to let them know that you have a living will and where they can find it. A living will is used if you become:  Terminally ill.  Incapacitated.  Unable to communicate or make decisions. Items to consider in your living will include:  The use or non-use of life-sustaining equipment, such as dialysis machines and breathing machines (ventilators).  A DNR or DNAR order, which is the instruction not to use cardiopulmonary resuscitation (CPR) if breathing or heartbeat stops.  The use or non-use of tube feeding.  Withholding of food  and fluids.  Comfort (palliative) care when the goal becomes comfort rather than a cure.  Organ and tissue donation. A living will does not give instructions for distributing your money and property if you should pass away. It is recommended that you seek the advice of a lawyer when writing a will. Decisions about taxes, beneficiaries, and asset distribution will be  legally binding. This process can relieve your family and friends of any concerns surrounding disputes or questions that may come up about the distribution of your assets. DNR or DNAR A DNR or DNAR order is a request not to have CPR in the event that your heart stops beating or you stop breathing. If a DNR or DNAR order has not been made and shared, a health care provider will try to help any patient whose heart has stopped or who has stopped breathing. If you plan to have surgery, talk with your health care provider about how your DNR or DNAR order will be followed if problems occur. Summary  Advance directives are the legal documents that allow you to make choices ahead of time about your health care and medical treatment in case you become unable to communicate for yourself.  The process of discussing and writing advance directives should happen over time. You can change the advance directives, even after you have signed them.  Advance directives include DNR or DNAR orders, living wills, and designating an agent as your medical power of attorney. This information is not intended to replace advice given to you by your health care provider. Make sure you discuss any questions you have with your health care provider. Document Released: 02/05/2008 Document Revised: 09/17/2016 Document Reviewed: 09/17/2016 Elsevier Interactive Patient Education  2017 [employer].

## 2017-03-12 DIAGNOSIS — T8249XD Other complication of vascular dialysis catheter, subsequent encounter: Secondary | ICD-10-CM | POA: Diagnosis not present

## 2017-03-12 DIAGNOSIS — D500 Iron deficiency anemia secondary to blood loss (chronic): Secondary | ICD-10-CM | POA: Diagnosis not present

## 2017-03-12 DIAGNOSIS — E119 Type 2 diabetes mellitus without complications: Secondary | ICD-10-CM | POA: Diagnosis not present

## 2017-03-12 DIAGNOSIS — N2581 Secondary hyperparathyroidism of renal origin: Secondary | ICD-10-CM | POA: Diagnosis not present

## 2017-03-12 DIAGNOSIS — D631 Anemia in chronic kidney disease: Secondary | ICD-10-CM | POA: Diagnosis not present

## 2017-03-12 DIAGNOSIS — N186 End stage renal disease: Secondary | ICD-10-CM | POA: Diagnosis not present

## 2017-03-13 ENCOUNTER — Ambulatory Visit: Payer: Medicare Other | Admitting: *Deleted

## 2017-03-14 DIAGNOSIS — N186 End stage renal disease: Secondary | ICD-10-CM | POA: Diagnosis not present

## 2017-03-14 DIAGNOSIS — D631 Anemia in chronic kidney disease: Secondary | ICD-10-CM | POA: Diagnosis not present

## 2017-03-14 DIAGNOSIS — E119 Type 2 diabetes mellitus without complications: Secondary | ICD-10-CM | POA: Diagnosis not present

## 2017-03-14 DIAGNOSIS — T8249XD Other complication of vascular dialysis catheter, subsequent encounter: Secondary | ICD-10-CM | POA: Diagnosis not present

## 2017-03-14 DIAGNOSIS — D500 Iron deficiency anemia secondary to blood loss (chronic): Secondary | ICD-10-CM | POA: Diagnosis not present

## 2017-03-14 DIAGNOSIS — N2581 Secondary hyperparathyroidism of renal origin: Secondary | ICD-10-CM | POA: Diagnosis not present

## 2017-03-15 ENCOUNTER — Encounter (HOSPITAL_BASED_OUTPATIENT_CLINIC_OR_DEPARTMENT_OTHER): Payer: Medicare Other | Attending: Internal Medicine

## 2017-03-15 ENCOUNTER — Other Ambulatory Visit: Payer: Self-pay | Admitting: Internal Medicine

## 2017-03-15 ENCOUNTER — Ambulatory Visit (HOSPITAL_COMMUNITY)
Admission: RE | Admit: 2017-03-15 | Discharge: 2017-03-15 | Disposition: A | Discharge status: Home / Self Care | Payer: Medicare Other | Source: Ambulatory Visit | Attending: Internal Medicine | Admitting: Internal Medicine

## 2017-03-15 DIAGNOSIS — H2510 Age-related nuclear cataract, unspecified eye: Secondary | ICD-10-CM | POA: Insufficient documentation

## 2017-03-15 DIAGNOSIS — I739 Peripheral vascular disease, unspecified: Secondary | ICD-10-CM | POA: Insufficient documentation

## 2017-03-15 DIAGNOSIS — E1140 Type 2 diabetes mellitus with diabetic neuropathy, unspecified: Secondary | ICD-10-CM | POA: Insufficient documentation

## 2017-03-15 DIAGNOSIS — E11621 Type 2 diabetes mellitus with foot ulcer: Secondary | ICD-10-CM | POA: Insufficient documentation

## 2017-03-15 DIAGNOSIS — I120 Hypertensive chronic kidney disease with stage 5 chronic kidney disease or end stage renal disease: Secondary | ICD-10-CM | POA: Insufficient documentation

## 2017-03-15 DIAGNOSIS — M869 Osteomyelitis, unspecified: Secondary | ICD-10-CM

## 2017-03-15 DIAGNOSIS — E1151 Type 2 diabetes mellitus with diabetic peripheral angiopathy without gangrene: Secondary | ICD-10-CM | POA: Insufficient documentation

## 2017-03-15 DIAGNOSIS — E1122 Type 2 diabetes mellitus with diabetic chronic kidney disease: Secondary | ICD-10-CM | POA: Diagnosis not present

## 2017-03-15 DIAGNOSIS — S91101A Unspecified open wound of right great toe without damage to nail, initial encounter: Secondary | ICD-10-CM | POA: Diagnosis not present

## 2017-03-15 DIAGNOSIS — L97512 Non-pressure chronic ulcer of other part of right foot with fat layer exposed: Secondary | ICD-10-CM | POA: Diagnosis not present

## 2017-03-15 DIAGNOSIS — R569 Unspecified convulsions: Secondary | ICD-10-CM | POA: Insufficient documentation

## 2017-03-15 DIAGNOSIS — L97511 Non-pressure chronic ulcer of other part of right foot limited to breakdown of skin: Secondary | ICD-10-CM | POA: Diagnosis not present

## 2017-03-15 DIAGNOSIS — N186 End stage renal disease: Secondary | ICD-10-CM | POA: Diagnosis not present

## 2017-03-15 DIAGNOSIS — Z992 Dependence on renal dialysis: Secondary | ICD-10-CM | POA: Insufficient documentation

## 2017-03-16 DIAGNOSIS — T8249XD Other complication of vascular dialysis catheter, subsequent encounter: Secondary | ICD-10-CM | POA: Diagnosis not present

## 2017-03-16 DIAGNOSIS — N2581 Secondary hyperparathyroidism of renal origin: Secondary | ICD-10-CM | POA: Diagnosis not present

## 2017-03-16 DIAGNOSIS — D500 Iron deficiency anemia secondary to blood loss (chronic): Secondary | ICD-10-CM | POA: Diagnosis not present

## 2017-03-16 DIAGNOSIS — N186 End stage renal disease: Secondary | ICD-10-CM | POA: Diagnosis not present

## 2017-03-16 DIAGNOSIS — E119 Type 2 diabetes mellitus without complications: Secondary | ICD-10-CM | POA: Diagnosis not present

## 2017-03-16 DIAGNOSIS — D631 Anemia in chronic kidney disease: Secondary | ICD-10-CM | POA: Diagnosis not present

## 2017-03-19 DIAGNOSIS — T8249XD Other complication of vascular dialysis catheter, subsequent encounter: Secondary | ICD-10-CM | POA: Diagnosis not present

## 2017-03-19 DIAGNOSIS — N186 End stage renal disease: Secondary | ICD-10-CM | POA: Diagnosis not present

## 2017-03-19 DIAGNOSIS — N2581 Secondary hyperparathyroidism of renal origin: Secondary | ICD-10-CM | POA: Diagnosis not present

## 2017-03-19 DIAGNOSIS — D500 Iron deficiency anemia secondary to blood loss (chronic): Secondary | ICD-10-CM | POA: Diagnosis not present

## 2017-03-19 DIAGNOSIS — E119 Type 2 diabetes mellitus without complications: Secondary | ICD-10-CM | POA: Diagnosis not present

## 2017-03-19 DIAGNOSIS — D631 Anemia in chronic kidney disease: Secondary | ICD-10-CM | POA: Diagnosis not present

## 2017-03-20 DIAGNOSIS — R531 Weakness: Secondary | ICD-10-CM | POA: Diagnosis not present

## 2017-03-20 DIAGNOSIS — I69398 Other sequelae of cerebral infarction: Secondary | ICD-10-CM | POA: Diagnosis not present

## 2017-03-21 DIAGNOSIS — N186 End stage renal disease: Secondary | ICD-10-CM | POA: Diagnosis not present

## 2017-03-21 DIAGNOSIS — E119 Type 2 diabetes mellitus without complications: Secondary | ICD-10-CM | POA: Diagnosis not present

## 2017-03-21 DIAGNOSIS — D500 Iron deficiency anemia secondary to blood loss (chronic): Secondary | ICD-10-CM | POA: Diagnosis not present

## 2017-03-21 DIAGNOSIS — N2581 Secondary hyperparathyroidism of renal origin: Secondary | ICD-10-CM | POA: Diagnosis not present

## 2017-03-21 DIAGNOSIS — D631 Anemia in chronic kidney disease: Secondary | ICD-10-CM | POA: Diagnosis not present

## 2017-03-21 DIAGNOSIS — T8249XD Other complication of vascular dialysis catheter, subsequent encounter: Secondary | ICD-10-CM | POA: Diagnosis not present

## 2017-03-22 ENCOUNTER — Ambulatory Visit (INDEPENDENT_AMBULATORY_CARE_PROVIDER_SITE_OTHER): Payer: Medicare Other | Admitting: Family Medicine

## 2017-03-22 ENCOUNTER — Encounter: Payer: Self-pay | Admitting: Family Medicine

## 2017-03-22 VITALS — BP 126/74 | HR 74 | Temp 98.1°F | Ht 66.0 in

## 2017-03-22 DIAGNOSIS — N760 Acute vaginitis: Secondary | ICD-10-CM | POA: Diagnosis not present

## 2017-03-22 DIAGNOSIS — I639 Cerebral infarction, unspecified: Secondary | ICD-10-CM

## 2017-03-22 DIAGNOSIS — K5900 Constipation, unspecified: Secondary | ICD-10-CM | POA: Diagnosis not present

## 2017-03-22 DIAGNOSIS — I120 Hypertensive chronic kidney disease with stage 5 chronic kidney disease or end stage renal disease: Secondary | ICD-10-CM | POA: Diagnosis not present

## 2017-03-22 DIAGNOSIS — E1151 Type 2 diabetes mellitus with diabetic peripheral angiopathy without gangrene: Secondary | ICD-10-CM | POA: Diagnosis not present

## 2017-03-22 DIAGNOSIS — E11621 Type 2 diabetes mellitus with foot ulcer: Secondary | ICD-10-CM | POA: Diagnosis not present

## 2017-03-22 DIAGNOSIS — L97512 Non-pressure chronic ulcer of other part of right foot with fat layer exposed: Secondary | ICD-10-CM | POA: Diagnosis not present

## 2017-03-22 DIAGNOSIS — E1122 Type 2 diabetes mellitus with diabetic chronic kidney disease: Secondary | ICD-10-CM | POA: Diagnosis not present

## 2017-03-22 DIAGNOSIS — N186 End stage renal disease: Secondary | ICD-10-CM | POA: Diagnosis not present

## 2017-03-22 MED ORDER — FLUCONAZOLE 50 MG PO TABS: 50.0000 mg | ORAL_TABLET | Freq: Every day | ORAL | 0 refills | Status: DC

## 2017-03-22 MED ORDER — PSYLLIUM 0.52 G PO CAPS: ORAL_CAPSULE | ORAL | 2 refills | Status: AC

## 2017-03-22 NOTE — Progress Notes (Signed)
Deborah Hendricks is a 71 y.o. female here for an acute visit.  History of Present Illness:   Water quality scientist, CMA, acting as scribe for Dr. Juleen China.  HPI:  1. Acute vaginitis. Patient recently on antibiotic. She is having some vaginal itch. No concern for STDs. No dysuria. Patient on dialysis for end-stage renal disease secondary to diabetes.   2. Constipation.  Ongoing. She has used MiraLAX in the past for constipation with relief. No abdominal pain.    PMHx, SurgHx, SocialHx, Medications, and Allergies were reviewed in the Visit Navigator and updated as appropriate.  Current Medications:   .  Amino Acids-Protein Hydrolys (FEEDING SUPPLEMENT, PRO-STAT SUGAR FREE 64,) LIQD, Take 30 mLs by mouth daily., Disp: 900 mL, Rfl: 0 .  apixaban (ELIQUIS) 2.5 MG TABS tablet, Take 1 tablet (2.5 mg total) by mouth 2 (two) times daily., Disp: 60 tablet, Rfl: 3 .  aspirin EC 81 MG tablet, Take 1 tablet (81 mg total) by mouth daily., Disp: 60 tablet, Rfl: 1 .  atorvastatin (LIPITOR) 40 MG tablet, Take 1 tablet (40 mg total) by mouth daily., Disp: 30 tablet, Rfl: 3 .  calcium acetate (PHOSLO) 667 MG capsule, Take 667 mg by mouth 3 (three) times daily with meals., Disp: , Rfl:  .  carvedilol (COREG) 3.125 MG tablet, Take 1 tablet (3.125 mg total) by mouth 2 (two) times daily with a meal. (Patient taking differently: Take 3.125 mg by mouth 2 (two) times daily. Take 3.125 mg by mouth daily on Tuesday, Thursday and Saturday after dialysis. Take 3.125 mg by mouth twice daily on all other days), Disp: 60 tablet, Rfl: 1 .  citalopram (CELEXA) 10 MG tablet, Take 10 mg by mouth daily., Disp: , Rfl: 3 .  doxycycline (VIBRAMYCIN) 100 MG capsule, Take 1 capsule by mouth 2 (two) times daily., Disp: , Rfl: 0 .  esomeprazole (NEXIUM) 20 MG capsule, Take 1 capsule (20 mg total) by mouth daily., Disp: 90 capsule, Rfl: 3 .  fluticasone (FLONASE) 50 MCG/ACT nasal spray, Place 2 sprays into both nostrils daily., Disp: , Rfl:    .  gabapentin (NEURONTIN) 100 MG capsule, Take 1 capsule (100 mg total) by mouth at bedtime., Disp: 30 capsule, Rfl: 0 .  insulin aspart (NOVOLOG) 100 UNIT/ML injection, Inject 2-10 Units into the skin 3 (three) times daily as needed for high blood sugar. 151-200=2 units, 201-250=4 units, 251-300=6 units, 301-350=8 units, 351-400=10 units, >400=10 units and Call MD, Disp: , Rfl:  .  insulin glargine (LANTUS) 100 UNIT/ML injection, Inject 2 Units into the skin as needed (Daughter states she only uses it as needed). , Disp: , Rfl:  .  multivitamin (RENA-VIT) TABS tablet, Take 1 tablet by mouth daily., Disp: , Rfl:  .  Nutritional Supplements (FEEDING SUPPLEMENT, NEPRO CARB STEADY,) LIQD, Take 237 mLs by mouth daily., Disp: 237 mL, Rfl: 12 .  ondansetron (ZOFRAN) 4 MG tablet, Take 4 mg by mouth every 8 (eight) hours as needed for nausea or vomiting., Disp: , Rfl:    Allergies  Allergen Reactions  . Penicillins Hives, Itching, Rash and Other (See Comments)    Has patient had a PCN reaction causing immediate rash, facial/tongue/throat swelling, SOB or lightheadedness with hypotension: Yes Has patient had a PCN reaction causing severe rash involving mucus membranes or skin necrosis:  Has patient had a PCN reaction that required hospitalization  Has patient had a PCN reaction occurring within the last 10 years:  If all of the above answers are "  NO", then may proceed with Cephalosporin use.  [name] Amoxicillin Hives, Itching and Rash  . Latex Rash  . Sulfa Antibiotics Other (See Comments)    Unknown    Review of Systems:   Review of Systems  Constitutional: Negative for chills and fever.  HENT: Negative for congestion, ear pain and sore throat.   Eyes: Negative for blurred vision and double vision.  Respiratory: Negative for cough and shortness of breath.   Cardiovascular: Negative for chest pain and palpitations.  Gastrointestinal: Negative for abdominal pain, nausea and vomiting.  Genitourinary:        Patient on dialysis.  Musculoskeletal: Negative for back pain and neck pain.  Skin: Negative for rash.  Neurological: Negative for dizziness, loss of consciousness and headaches.  Psychiatric/Behavioral: Negative for depression. The patient is not nervous/anxious.     Vitals:   Vitals:   03/22/17 0845  BP: 126/74  Pulse: 74  Temp: 98.1 F (36.7 C)  TempSrc: Oral  SpO2: 97%  Height: 5\' 6"  (1.676 m)     There is no height or weight on file to calculate BMI.  Physical Exam:   Physical Exam  Constitutional: She appears well-developed and well-nourished. No distress.  HENT:  Head: Normocephalic and atraumatic.  Cardiovascular: Normal rate and regular rhythm.   Pulmonary/Chest: Effort normal.  Abdominal: Soft.  Psychiatric: She has a normal mood and affect. Her behavior is normal.  Nursing note and vitals reviewed.  Assessment and Plan:    [name] was seen today for acute visit.  Diagnoses and all orders for this visit:  Acute vaginitis -     fluconazole (DIFLUCAN) 50 MG tablet; Take 1 tablet (50 mg total) by mouth daily.  Constipation, unspecified constipation type -     psyllium (REGULOID) 0.52 g capsule; One po tid prn constipation.   . Reviewed expectations re: course of current medical issues. . Discussed self-management of symptoms. . Outlined signs and symptoms indicating need for more acute intervention. . Patient verbalized understanding and all questions were answered. . See orders for this visit as documented in the electronic medical record. . Patient received an After-Visit Summary.  CMA served as Education administrator during this visit. History, Physical, and Plan performed by medical provider. Documentation and orders reviewed and attested to. Deborah Hendricks, D.O.  Deborah Deutscher, DO Family Medicine [place], [facility] 03/22/2017   Future Appointments Date Time Provider [place]  05/13/2017 12:30 PM [facility] 1 [facility]  05/13/2017 1:30 PM  [facility] 1 [facility]  05/13/2017 1:45 PM Serafina Mitchell, MD [facility]  03/12/2018 11:00 AM Stephanie Acre, RN [facility] None

## 2017-03-26 ENCOUNTER — Emergency Department (HOSPITAL_COMMUNITY): Payer: Medicare Other

## 2017-03-26 ENCOUNTER — Emergency Department (HOSPITAL_COMMUNITY)
Admission: EM | Admit: 2017-03-26 | Discharge: 2017-03-26 | Disposition: A | Discharge status: Home / Self Care | Payer: Medicare Other | Attending: Emergency Medicine | Admitting: Emergency Medicine

## 2017-03-26 ENCOUNTER — Encounter (HOSPITAL_COMMUNITY): Payer: Self-pay

## 2017-03-26 DIAGNOSIS — R1111 Vomiting without nausea: Secondary | ICD-10-CM | POA: Diagnosis not present

## 2017-03-26 DIAGNOSIS — N186 End stage renal disease: Secondary | ICD-10-CM | POA: Insufficient documentation

## 2017-03-26 DIAGNOSIS — Z7901 Long term (current) use of anticoagulants: Secondary | ICD-10-CM | POA: Diagnosis not present

## 2017-03-26 DIAGNOSIS — D631 Anemia in chronic kidney disease: Secondary | ICD-10-CM | POA: Diagnosis not present

## 2017-03-26 DIAGNOSIS — I509 Heart failure, unspecified: Secondary | ICD-10-CM | POA: Diagnosis not present

## 2017-03-26 DIAGNOSIS — R112 Nausea with vomiting, unspecified: Secondary | ICD-10-CM | POA: Diagnosis not present

## 2017-03-26 DIAGNOSIS — Z91040 Latex allergy status: Secondary | ICD-10-CM | POA: Diagnosis not present

## 2017-03-26 DIAGNOSIS — I132 Hypertensive heart and chronic kidney disease with heart failure and with stage 5 chronic kidney disease, or end stage renal disease: Secondary | ICD-10-CM | POA: Insufficient documentation

## 2017-03-26 DIAGNOSIS — R1084 Generalized abdominal pain: Secondary | ICD-10-CM | POA: Diagnosis not present

## 2017-03-26 DIAGNOSIS — N2581 Secondary hyperparathyroidism of renal origin: Secondary | ICD-10-CM | POA: Diagnosis not present

## 2017-03-26 DIAGNOSIS — D500 Iron deficiency anemia secondary to blood loss (chronic): Secondary | ICD-10-CM | POA: Diagnosis not present

## 2017-03-26 DIAGNOSIS — R1110 Vomiting, unspecified: Secondary | ICD-10-CM | POA: Diagnosis not present

## 2017-03-26 DIAGNOSIS — R392 Extrarenal uremia: Secondary | ICD-10-CM | POA: Insufficient documentation

## 2017-03-26 DIAGNOSIS — Z87891 Personal history of nicotine dependence: Secondary | ICD-10-CM | POA: Insufficient documentation

## 2017-03-26 DIAGNOSIS — N19 Unspecified kidney failure: Secondary | ICD-10-CM | POA: Diagnosis not present

## 2017-03-26 DIAGNOSIS — E119 Type 2 diabetes mellitus without complications: Secondary | ICD-10-CM | POA: Diagnosis not present

## 2017-03-26 DIAGNOSIS — R1013 Epigastric pain: Secondary | ICD-10-CM | POA: Diagnosis present

## 2017-03-26 DIAGNOSIS — Z7982 Long term (current) use of aspirin: Secondary | ICD-10-CM | POA: Insufficient documentation

## 2017-03-26 DIAGNOSIS — D259 Leiomyoma of uterus, unspecified: Secondary | ICD-10-CM | POA: Diagnosis not present

## 2017-03-26 DIAGNOSIS — E1122 Type 2 diabetes mellitus with diabetic chronic kidney disease: Secondary | ICD-10-CM | POA: Diagnosis not present

## 2017-03-26 DIAGNOSIS — Z8673 Personal history of transient ischemic attack (TIA), and cerebral infarction without residual deficits: Secondary | ICD-10-CM | POA: Insufficient documentation

## 2017-03-26 DIAGNOSIS — K2970 Gastritis, unspecified, without bleeding: Secondary | ICD-10-CM | POA: Diagnosis not present

## 2017-03-26 DIAGNOSIS — Z794 Long term (current) use of insulin: Secondary | ICD-10-CM | POA: Insufficient documentation

## 2017-03-26 DIAGNOSIS — J9811 Atelectasis: Secondary | ICD-10-CM | POA: Diagnosis not present

## 2017-03-26 DIAGNOSIS — I120 Hypertensive chronic kidney disease with stage 5 chronic kidney disease or end stage renal disease: Secondary | ICD-10-CM | POA: Diagnosis not present

## 2017-03-26 DIAGNOSIS — T8249XD Other complication of vascular dialysis catheter, subsequent encounter: Secondary | ICD-10-CM | POA: Diagnosis not present

## 2017-03-26 LAB — I-STAT CHEM 8, ED
BUN: 80 mg/dL — ABNORMAL HIGH (ref 6–20)
CHLORIDE: 111 mmol/L (ref 101–111)
CREATININE: 8.4 mg/dL — AB (ref 0.44–1.00)
Calcium, Ion: 1.02 mmol/L — ABNORMAL LOW (ref 1.15–1.40)
Glucose, Bld: 227 mg/dL — ABNORMAL HIGH (ref 65–99)
HCT: 37 % (ref 36.0–46.0)
HEMOGLOBIN: 12.6 g/dL (ref 12.0–15.0)
POTASSIUM: 4.6 mmol/L (ref 3.5–5.1)
Sodium: 139 mmol/L (ref 135–145)
TCO2: 19 mmol/L (ref 0–100)

## 2017-03-26 LAB — COMPREHENSIVE METABOLIC PANEL
ALBUMIN: 3.6 g/dL (ref 3.5–5.0)
ALT: 17 U/L (ref 14–54)
ANION GAP: 13 (ref 5–15)
AST: 19 U/L (ref 15–41)
Alkaline Phosphatase: 138 U/L — ABNORMAL HIGH (ref 38–126)
BILIRUBIN TOTAL: 0.4 mg/dL (ref 0.3–1.2)
BUN: 85 mg/dL — ABNORMAL HIGH (ref 6–20)
CHLORIDE: 106 mmol/L (ref 101–111)
CO2: 18 mmol/L — ABNORMAL LOW (ref 22–32)
Calcium: 8.9 mg/dL (ref 8.9–10.3)
Creatinine, Ser: 8.16 mg/dL — ABNORMAL HIGH (ref 0.44–1.00)
GFR calc Af Amer: 5 mL/min — ABNORMAL LOW (ref >60)
GFR calc non Af Amer: 4 mL/min — ABNORMAL LOW (ref >60)
GLUCOSE: 239 mg/dL — AB (ref 65–99)
POTASSIUM: 4.5 mmol/L (ref 3.5–5.1)
Sodium: 137 mmol/L (ref 135–145)
TOTAL PROTEIN: 6.9 g/dL (ref 6.5–8.1)

## 2017-03-26 LAB — LIPASE, BLOOD: LIPASE: 32 U/L (ref 11–51)

## 2017-03-26 LAB — CBC WITH DIFFERENTIAL/PLATELET
BASOS ABS: 0 10*3/uL (ref 0.0–0.1)
BASOS PCT: 0 %
EOS ABS: 0.1 10*3/uL (ref 0.0–0.7)
Eosinophils Relative: 1 %
HEMATOCRIT: 35.9 % — AB (ref 36.0–46.0)
Hemoglobin: 11.3 g/dL — ABNORMAL LOW (ref 12.0–15.0)
Lymphocytes Relative: 11 %
Lymphs Abs: 0.6 10*3/uL — ABNORMAL LOW (ref 0.7–4.0)
MCH: 29.4 pg (ref 26.0–34.0)
MCHC: 31.5 g/dL (ref 30.0–36.0)
MCV: 93.2 fL (ref 78.0–100.0)
MONO ABS: 0.4 10*3/uL (ref 0.1–1.0)
Monocytes Relative: 7 %
Neutro Abs: 4.3 10*3/uL (ref 1.7–7.7)
Neutrophils Relative %: 81 %
PLATELETS: 234 10*3/uL (ref 150–400)
RBC: 3.85 MIL/uL — ABNORMAL LOW (ref 3.87–5.11)
RDW: 15.7 % — ABNORMAL HIGH (ref 11.5–15.5)
WBC: 5.4 10*3/uL (ref 4.0–10.5)

## 2017-03-26 MED ORDER — PROMETHAZINE HCL 25 MG/ML IJ SOLN
12.5000 mg | Freq: Once | INTRAMUSCULAR | Status: AC
  Administered 2017-03-26: 12.5 mg via INTRAVENOUS
  Filled 2017-03-26: qty 1

## 2017-03-26 MED ORDER — ONDANSETRON HCL 4 MG/2ML IJ SOLN
INTRAMUSCULAR | Status: AC
  Administered 2017-03-26: 4 mg
  Filled 2017-03-26: qty 2

## 2017-03-26 MED ORDER — ONDANSETRON 4 MG PO TBDP: 4.0000 mg | ORAL_TABLET | Freq: Three times a day (TID) | ORAL | 0 refills | Status: DC | PRN

## 2017-03-26 MED ORDER — ONDANSETRON HCL 4 MG/2ML IJ SOLN: 4.0000 mg | Freq: Once | INTRAMUSCULAR | Status: DC

## 2017-03-26 NOTE — ED Provider Notes (Signed)
[facility] DEPT Provider Note   CSN: [id number] Arrival date & time: 03/26/17  0249  By signing my name below, I, Deborah Hendricks, attest that this documentation has been prepared under the direction and in the presence of Cinque Begley, Barbette Hair, MD. Electronically Signed: Oleh Hendricks, Scribe. 03/26/17. 3:17 AM.   History   Chief Complaint Chief Complaint  Patient presents with  . Abdominal Pain  . Emesis    HPI Deborah Hendricks is a 71 y.o. female with history of diabetes, prior L cerebral infarctions, seizure disorder, HTN, HLD, PAF on Eliquis, and ESRD on HD TRS who presents to the ED with vomiting. The patient states that she was in her normal state of health until 1 hour ago when she had sudden onset of nausea and frequent vomiting in the setting of severe epigastric pain. At interview, she is actively vomiting. Also reporting hard stooling recently. She missed her last dialysis appointment 3 days ago.  Denies chest pain, shortness of breath, fevers.  The history is provided by the patient. No language interpreter was used.  Abdominal Pain   This is a new problem. The current episode started 1 to 2 hours ago. The problem occurs constantly. The problem has not changed since onset.The pain is associated with an unknown factor. The pain is located in the epigastric region. The pain is severe. Associated symptoms include nausea, vomiting and constipation. Pertinent negatives include fever.    Past Medical History:  Diagnosis Date  . Anemia   . Anxiety   . CHF (congestive heart failure) ([place])   . Constipation   . Critical lower limb ischemia   . Depression   . ESRD on dialysis [facility])    "TTS; [place]" (11/27/2016)  . GERD (gastroesophageal reflux disease)   . Heart murmur   . HLD (hyperlipidemia)   . Hypertension   . Memory loss   . Peripheral vascular disease ([place])   . Refusal of blood transfusions as patient is Jehovah's Witness   . Seizures ([place]) 04/2015   s/p  CVA.  [name] Sleep apnea    Noncompliant with CPAP.  [name] Stroke ([place]) 2015  . Type II diabetes mellitus [facility])     Patient Active Problem List   Diagnosis Date Noted  . History of hysterectomy 03/09/2017  . Former smoker 03/09/2017  . Dysphagia 03/09/2017  . Carotid stenosis 03/09/2017  . Need for assistance due to reduced mobility 03/09/2017  . Complication of vascular access for dialysis 03/09/2017  . Anemia in chronic kidney disease (CKD) 03/09/2017  . Sleep apnea   . Refusal of blood transfusions as patient is Jehovah's Witness   . Constipation   . GERD (gastroesophageal reflux disease)   . HLD (hyperlipidemia)   . TIA (transient ischemic attack) 02/01/2017  . Diabetic ulcer of toe of right foot associated with diabetes mellitus due to underlying condition, limited to breakdown of skin ([place])   . PVD (peripheral vascular disease) ([place]) 11/27/2016  . Blurry vision   . Asymmetric SNHL (sensorineural hearing loss) 06/12/2016  . Peripheral neuropathy 06/11/2016  . History of pulmonary embolism 06/08/2016  . History of CVA with residual deficit 06/08/2016  . History of borderline personality disorder 04/19/2016  . History of medication noncompliance 04/19/2016  . Paroxysmal atrial fibrillation ([place]) 04/19/2016  . Seizure disorder ([place]) 04/19/2016  . Renovascular hypertension 04/13/2016  . ESRD on dialysis ([place]) 04/11/2016  . Generalized anxiety disorder 04/11/2016  . Major depression, recurrent, chronic ([place]) 04/11/2016  . S/P AKA (above  knee amputation) unilateral, left ([place]) 04/11/2016  . PAD (peripheral artery disease) ([place]) 12/15/2015  . Type II diabetes mellitus with neurological manifestations ([place]) 10/21/2015  . Seizures ([place]) 04/13/2015    Past Surgical History:  Procedure Laterality Date  . ABDOMINAL HYSTERECTOMY    . AMPUTATION Left 12/15/2015   Procedure: LEFT ABOVE KNEE AMPUTATION;  Surgeon: Serafina Mitchell, MD;  Location: [facility];  Service: Vascular;  Laterality: Left;    . BASCILIC VEIN TRANSPOSITION Right 11/25/2015   Procedure: Right Arm FIRST STAGE BASILIC VEIN TRANSPOSITION;  Surgeon: Serafina Mitchell, MD;  Location: [facility];  Service: Vascular;  Laterality: Right;  . BASCILIC VEIN TRANSPOSITION Right 01/19/2016   Procedure: RIGHT SECOND STAGE BASILIC VEIN TRANSPOSITION;  Surgeon: Serafina Mitchell, MD;  Location: [facility];  Service: Vascular;  Laterality: Right;  . DILATION AND CURETTAGE OF UTERUS    . EYE SURGERY    . PERIPHERAL VASCULAR CATHETERIZATION Bilateral 11/28/2015   Procedure: Lower Extremity Angiography;  Surgeon: Lorretta Harp, MD;  Location: [place] CV LAB;  Service: Cardiovascular;  Laterality: Bilateral;  . PERIPHERAL VASCULAR CATHETERIZATION N/A 11/28/2015   Procedure: Abdominal Aortogram;  Surgeon: Lorretta Harp, MD;  Location: [place] [facility] LAB;  Service: Cardiovascular;  Laterality: N/A;  . PERIPHERAL VASCULAR CATHETERIZATION Right 11/27/2016  . PERIPHERAL VASCULAR CATHETERIZATION N/A 11/27/2016   Procedure: Abdominal Aortogram w/Lower Extremity;  Surgeon: Serafina Mitchell, MD;  Location: [place] CV LAB;  Service: Cardiovascular;  Laterality: N/A;  . PERIPHERAL VASCULAR CATHETERIZATION N/A 11/27/2016   Procedure: Right arm and central venogram;  Surgeon: Serafina Mitchell, MD;  Location: [place] CV LAB;  Service: Cardiovascular;  Laterality: N/A;  . PERIPHERAL VASCULAR CATHETERIZATION Right 11/27/2016   Procedure: Peripheral Vascular Atherectomy;  Surgeon: Serafina Mitchell, MD;  Location: [place] CV LAB;  Service: Cardiovascular;  Laterality: Right;  Rt    sfa, popliteal, tibioperoneal trunk, peroneal  . PERIPHERAL VASCULAR CATHETERIZATION Right 11/27/2016   Procedure: Peripheral Vascular Balloon Angioplasty;  Surgeon: Serafina Mitchell, MD;  Location: [place] CV LAB;  Service: Cardiovascular;  Laterality: Right;  Tibial peroneal  . RETINOPATHY SURGERY Bilateral   . TOE AMPUTATION Right    2nd, 3rd, 4th, 5th digits  .  TONSILLECTOMY      OB History    No data available       Home Medications    Prior to Admission medications   Medication Sig Start Date End Date Taking? Authorizing Provider  Amino Acids-Protein Hydrolys (FEEDING SUPPLEMENT, PRO-STAT SUGAR FREE 64,) LIQD Take 30 mLs by mouth daily. 02/01/17   Asencion Partridge, MD  apixaban (ELIQUIS) 2.5 MG TABS tablet Take 1 tablet (2.5 mg total) by mouth 2 (two) times daily. 03/07/17   Briscoe Deutscher, DO  aspirin EC 81 MG tablet Take 1 tablet (81 mg total) by mouth daily. 07/30/16   Reyne Dumas, MD  atorvastatin (LIPITOR) 40 MG tablet Take 1 tablet (40 mg total) by mouth daily. 03/07/17   Briscoe Deutscher, DO  calcium acetate (PHOSLO) 667 MG capsule Take 667 mg by mouth 3 (three) times daily with meals.    [provider]  carvedilol (COREG) 3.125 MG tablet Take 1 tablet (3.125 mg total) by mouth 2 (two) times daily with a meal. Patient taking differently: Take 3.125 mg by mouth 2 (two) times daily. Take 3.125 mg by mouth daily on Tuesday, Thursday and Saturday after dialysis. Take 3.125 mg by mouth twice daily on all other days 07/30/16  Reyne Dumas, MD  citalopram (CELEXA) 10 MG tablet Take 10 mg by mouth daily. 09/27/16   [provider]  doxycycline (VIBRAMYCIN) 100 MG capsule Take 1 capsule by mouth 2 (two) times daily. 03/05/17   [provider]  esomeprazole (NEXIUM) 20 MG capsule Take 1 capsule (20 mg total) by mouth daily. 03/11/17   Briscoe Deutscher, DO  fluconazole (DIFLUCAN) 50 MG tablet Take 1 tablet (50 mg total) by mouth daily. 03/22/17   Briscoe Deutscher, DO  fluticasone (FLONASE) 50 MCG/ACT nasal spray Place 2 sprays into both nostrils daily.    [provider]  gabapentin (NEURONTIN) 100 MG capsule Take 1 capsule (100 mg total) by mouth at bedtime. 02/01/17   Holley Raring, MD  insulin aspart (NOVOLOG) 100 UNIT/ML injection Inject 2-10 Units into the skin 3 (three) times daily as needed for high blood sugar.  151-200=2 units, 201-250=4 units, 251-300=6 units, 301-350=8 units, 351-400=10 units, >400=10 units and Call MD    [provider]  insulin glargine (LANTUS) 100 UNIT/ML injection Inject 2 Units into the skin as needed (Daughter states she only uses it as needed).     [provider]  multivitamin (RENA-VIT) TABS tablet Take 1 tablet by mouth daily.    [provider]  Nutritional Supplements (FEEDING SUPPLEMENT, NEPRO CARB STEADY,) LIQD Take 237 mLs by mouth daily. 02/02/17   Asencion Partridge, MD  ondansetron (ZOFRAN ODT) 4 MG disintegrating tablet Take 1 tablet (4 mg total) by mouth every 8 (eight) hours as needed for nausea or vomiting. 03/26/17   Stanislav Gervase, Barbette Hair, MD  ondansetron (ZOFRAN) 4 MG tablet Take 4 mg by mouth every 8 (eight) hours as needed for nausea or vomiting.    [provider]  psyllium (REGULOID) 0.52 g capsule One po tid prn constipation. 03/22/17   Briscoe Deutscher, DO    Family History Family History  Problem Relation Age of Onset  . Asthma Mother   . Cancer Sister     Social History Social History  Substance Use Topics  . Smoking status: Former Smoker    Packs/day: 0.25    Years: 51.00    Types: Cigarettes    Quit date: 04/13/2015  . Smokeless tobacco: Never Used  . Alcohol use 0.0 oz/week     Comment: 11/27/2016 "stopped in 04/2015"     Allergies   Penicillins; Amoxicillin; Latex; and Sulfa antibiotics   Review of Systems Review of Systems  Constitutional: Negative for chills and fever.  Respiratory: Negative for shortness of breath.   Cardiovascular: Negative for chest pain.  Gastrointestinal: Positive for abdominal pain, constipation, nausea and vomiting.  All other systems reviewed and are negative.    Physical Exam Updated Vital Signs BP (!) 157/84   Pulse 84   Temp 98 F (36.7 C) (Oral)   Resp 18   Ht 5\' 6"  (1.676 m)   Wt 153 lb (69.4 kg)   SpO2 100%   BMI 24.69 kg/m   Physical Exam  Constitutional: She  is oriented to person, place, and time.  Ill-appearing, retching  HENT:  Head: Normocephalic and atraumatic.  His membranes moist  Cardiovascular: Normal rate, regular rhythm and normal heart sounds.   No murmur heard. Pulmonary/Chest: Effort normal. No respiratory distress. She has no wheezes.  Abdominal: Soft. There is no rebound and no guarding.  No significant tenderness to palpation, hyperactive bowel sounds  Musculoskeletal:  Left AKA.  Trace right lower extremity edema  Neurological: She is alert and oriented to  person, place, and time.  Skin: Skin is warm and dry.  Psychiatric: She has a normal mood and affect.  Nursing note and vitals reviewed.    ED Treatments / Results  Labs (all labs ordered are listed, but only abnormal results are displayed) Labs Reviewed  CBC WITH DIFFERENTIAL/PLATELET - Abnormal; Notable for the following:       Result Value   RBC 3.85 (*)    Hemoglobin 11.3 (*)    HCT 35.9 (*)    RDW 15.7 (*)    Lymphs Abs 0.6 (*)    All other components within normal limits  COMPREHENSIVE METABOLIC PANEL - Abnormal; Notable for the following:    CO2 18 (*)    Glucose, Bld 239 (*)    BUN 85 (*)    Creatinine, Ser 8.16 (*)    Alkaline Phosphatase 138 (*)    GFR calc non Af Amer 4 (*)    GFR calc Af Amer 5 (*)    All other components within normal limits  I-STAT CHEM 8, ED - Abnormal; Notable for the following:    BUN 80 (*)    Creatinine, Ser 8.40 (*)    Glucose, Bld 227 (*)    Calcium, Ion 1.02 (*)    All other components within normal limits  LIPASE, BLOOD    EKG  EKG Interpretation  Date/Time:  Tuesday Mar 26 2017 03:04:23 EDT Ventricular Rate:  83 PR Interval:    QRS Duration: 90 QT Interval:  393 QTC Calculation: 462 R Axis:   -35 Text Interpretation:  Sinus rhythm Prolonged PR interval Left axis deviation Anterior infarct, old Minimal ST depression, lateral leads No significant change since last tracing Confirmed by Thayer Jew  [phone]) on 03/26/2017 4:45:34 AM       Radiology Ct Abdomen Pelvis Wo Contrast  Result Date: 03/26/2017 CLINICAL DATA:  Abdominal pain with nausea and vomiting EXAM: CT ABDOMEN AND PELVIS WITHOUT CONTRAST TECHNIQUE: Multidetector CT imaging of the abdomen and pelvis was performed following the standard protocol without IV contrast. COMPARISON:  None. FINDINGS: Lower chest: No pulmonary nodules. No visible pleural or pericardial effusion. There is a right femoral approach central venous catheter with tip in the right atrium. Hepatobiliary: Normal hepatic size and contours without focal liver lesion. No perihepatic ascites. No intra- or extrahepatic biliary dilatation. Normal gallbladder. Pancreas: Normal pancreatic contours and enhancement. No peripancreatic fluid collection or pancreatic ductal dilatation. Spleen: Normal. Adrenals/Urinary Tract: Normal adrenal glands. No hydronephrosis or solid renal mass. Bilateral renal vascular calcifications. Stomach/Bowel: Small hiatal hernia. Normal stomach and duodenum. No dilated small bowel. Normal appendix. No focal colonic abnormality. Large stool ball in the rectum. Vascular/Lymphatic: There is atherosclerotic calcification of the non aneurysmal abdominal aorta. No abdominal or pelvic adenopathy. Reproductive: There are multiple calcified fibroids within the uterus. No adnexal mass. Musculoskeletal: Multilevel lumbar facet arthrosis. Normal visualized extrathoracic and extraperitoneal soft tissues. Other: No contributory non-categorized findings. IMPRESSION: 1. No acute abdominal or pelvic abnormality. 2. Multiple uterine fibroids. 3. Moderate amount of stool in the rectum. 4. Aortic atherosclerosis. Electronically Signed   By: Ulyses Jarred M.D.   On: 03/26/2017 05:06   Dg Chest Portable 1 View  Result Date: 03/26/2017 CLINICAL DATA:  Vomiting and abdominal pain.  Weakness. EXAM: PORTABLE CHEST 1 VIEW COMPARISON:  Chest radiographs 07/29/2016 FINDINGS: The  patient is rotated. Low lung volumes with crowding of bronchovascular structures. Heart size not well assessed due to positioning. Catheter from an inferior approach with tip at the  atrial IVC junction. Left basilar atelectasis. No large pleural effusion. No pneumothorax. Grossly stable osseous structures. No evidence of free air in the upper abdomen. IMPRESSION: Low lung volumes limiting assessment.  Left basilar atelectasis. Electronically Signed   By: Jeb Levering M.D.   On: 03/26/2017 04:37    Procedures Procedures (including critical care time)  Medications Ordered in ED Medications  ondansetron (ZOFRAN) 4 MG/2ML injection (4 mg  Given 03/26/17 0317)  promethazine (PHENERGAN) injection 12.5 mg (12.5 mg Intravenous Given 03/26/17 0418)     Initial Impression / Assessment and Plan / ED Course  I have reviewed the triage vital signs and the nursing notes.  Pertinent labs & imaging results that were available during my care of the patient were reviewed by me and considered in my medical decision making (see chart for details).  Clinical Course as of Mar 26 640  Tue Mar 26, 2017  0640 Patient somnolent but arousable after Phenergan. Reports persistent nausea but is tolerating fluids. Workup only notable for mild uremia with a BUN of 85. Baseline 50-60. This is likely related to missed dialysis. Otherwise her CT scan is reassuring and without abnormality. Given fairly benign abdominal exam and the fact that she is tolerating fluids, feel she would benefit from dialysis as scheduled. Will discharge with Zofran.  [CH]    Clinical Course User Index [CH] Abiel Antrim, Barbette Hair, MD    Patient presents with vomiting and abdominal pain 1 hour prior to arrival. She is ill-appearing and actively vomiting vital signs reassuring. Patient initially given Zofran. She appears hydrated. She did miss dialysis on Saturday. Lab work obtained. Potassium reassuring. EKG is without significant changes. Mildly  uremic at 85. CT scan of the abdomen obtained to rule out obstruction or other acute abnormalities including volvulus given the acuity and onset of pain and vomiting. Patient continued to have vomiting following Zofran. She was given a dose of 12.5 mg of Phenergan. This improved her nausea. It did make her sleepy. CT scan is negative. See further patient course above.  After history, exam, and medical workup I feel the patient has been appropriately medically screened and is safe for discharge home. Pertinent diagnoses were discussed with the patient. Patient was given return precautions.   Final Clinical Impressions(s) / ED Diagnoses   Final diagnoses:  Non-intractable vomiting with nausea, unspecified vomiting type  Uremia  ESRD (end stage renal disease) (HCC)    New Prescriptions New Prescriptions   ONDANSETRON (ZOFRAN ODT) 4 MG DISINTEGRATING TABLET    Take 1 tablet (4 mg total) by mouth every 8 (eight) hours as needed for nausea or vomiting.   I personally performed the services described in this documentation, which was scribed in my presence. The recorded information has been reviewed and is accurate.    Merryl Hacker, MD 03/26/17 [phone]

## 2017-03-26 NOTE — ED Triage Notes (Signed)
Pt arrived via [facility] from home c/o N/V x30 minutes.  Abdominal pain 8/10 generalized.  Tuesday, Thursday and Saturday dialysis....missed Saturday.

## 2017-03-26 NOTE — ED Notes (Signed)
Pt tolerating water, still has nausea but no vomitting

## 2017-03-26 NOTE — ED Notes (Signed)
Patient transported to CT 

## 2017-03-26 NOTE — Discharge Instructions (Signed)
You were seen today for nausea, vomiting, and abdominal pain. Your lab work and CT scan is reassuring. You are mildly uremic. This is likely related to missing dialysis on Saturday. Dialysis may very well help symptoms. You improved with antinausea medications in the ED. You'll be discharged with Zofran as needed. You need to go to dialysis immediately at discharge. If you have any new or worsening symptoms she should be reevaluated.

## 2017-03-28 DIAGNOSIS — E119 Type 2 diabetes mellitus without complications: Secondary | ICD-10-CM | POA: Diagnosis not present

## 2017-03-28 DIAGNOSIS — N2581 Secondary hyperparathyroidism of renal origin: Secondary | ICD-10-CM | POA: Diagnosis not present

## 2017-03-28 DIAGNOSIS — D500 Iron deficiency anemia secondary to blood loss (chronic): Secondary | ICD-10-CM | POA: Diagnosis not present

## 2017-03-28 DIAGNOSIS — D631 Anemia in chronic kidney disease: Secondary | ICD-10-CM | POA: Diagnosis not present

## 2017-03-28 DIAGNOSIS — T8249XD Other complication of vascular dialysis catheter, subsequent encounter: Secondary | ICD-10-CM | POA: Diagnosis not present

## 2017-03-28 DIAGNOSIS — N186 End stage renal disease: Secondary | ICD-10-CM | POA: Diagnosis not present

## 2017-03-29 ENCOUNTER — Telehealth: Payer: Self-pay | Admitting: Family Medicine

## 2017-03-29 DIAGNOSIS — E1151 Type 2 diabetes mellitus with diabetic peripheral angiopathy without gangrene: Secondary | ICD-10-CM | POA: Diagnosis not present

## 2017-03-29 DIAGNOSIS — E11621 Type 2 diabetes mellitus with foot ulcer: Secondary | ICD-10-CM | POA: Diagnosis not present

## 2017-03-29 DIAGNOSIS — N186 End stage renal disease: Secondary | ICD-10-CM | POA: Diagnosis not present

## 2017-03-29 DIAGNOSIS — E1122 Type 2 diabetes mellitus with diabetic chronic kidney disease: Secondary | ICD-10-CM | POA: Diagnosis not present

## 2017-03-29 DIAGNOSIS — L97512 Non-pressure chronic ulcer of other part of right foot with fat layer exposed: Secondary | ICD-10-CM | POA: Diagnosis not present

## 2017-03-29 DIAGNOSIS — I120 Hypertensive chronic kidney disease with stage 5 chronic kidney disease or end stage renal disease: Secondary | ICD-10-CM | POA: Diagnosis not present

## 2017-03-29 NOTE — Telephone Encounter (Signed)
[facility] calling to try and touch base about patients diabetic foot care. Asked for return call.

## 2017-03-30 DIAGNOSIS — D631 Anemia in chronic kidney disease: Secondary | ICD-10-CM | POA: Diagnosis not present

## 2017-03-30 DIAGNOSIS — T8249XD Other complication of vascular dialysis catheter, subsequent encounter: Secondary | ICD-10-CM | POA: Diagnosis not present

## 2017-03-30 DIAGNOSIS — E119 Type 2 diabetes mellitus without complications: Secondary | ICD-10-CM | POA: Diagnosis not present

## 2017-03-30 DIAGNOSIS — N186 End stage renal disease: Secondary | ICD-10-CM | POA: Diagnosis not present

## 2017-03-30 DIAGNOSIS — N2581 Secondary hyperparathyroidism of renal origin: Secondary | ICD-10-CM | POA: Diagnosis not present

## 2017-03-30 DIAGNOSIS — D500 Iron deficiency anemia secondary to blood loss (chronic): Secondary | ICD-10-CM | POA: Diagnosis not present

## 2017-04-01 ENCOUNTER — Encounter: Payer: Self-pay | Admitting: Surgery

## 2017-04-01 ENCOUNTER — Encounter: Payer: Self-pay | Admitting: Internal Medicine

## 2017-04-01 ENCOUNTER — Encounter: Payer: Self-pay | Admitting: Physician Assistant

## 2017-04-01 ENCOUNTER — Encounter: Payer: Self-pay | Admitting: Cardiovascular Disease

## 2017-04-01 ENCOUNTER — Encounter: Payer: Self-pay | Admitting: Family Medicine

## 2017-04-02 ENCOUNTER — Encounter: Payer: Self-pay | Admitting: Family Medicine

## 2017-04-02 DIAGNOSIS — N186 End stage renal disease: Secondary | ICD-10-CM | POA: Diagnosis not present

## 2017-04-02 DIAGNOSIS — D500 Iron deficiency anemia secondary to blood loss (chronic): Secondary | ICD-10-CM | POA: Diagnosis not present

## 2017-04-02 DIAGNOSIS — D631 Anemia in chronic kidney disease: Secondary | ICD-10-CM | POA: Diagnosis not present

## 2017-04-02 DIAGNOSIS — E119 Type 2 diabetes mellitus without complications: Secondary | ICD-10-CM | POA: Diagnosis not present

## 2017-04-02 DIAGNOSIS — N2581 Secondary hyperparathyroidism of renal origin: Secondary | ICD-10-CM | POA: Diagnosis not present

## 2017-04-02 DIAGNOSIS — T8249XD Other complication of vascular dialysis catheter, subsequent encounter: Secondary | ICD-10-CM | POA: Diagnosis not present

## 2017-04-02 NOTE — Telephone Encounter (Signed)
Received Rx for skin for patient's prosthetic.  Signed and faxed back.

## 2017-04-03 ENCOUNTER — Encounter: Payer: Self-pay | Admitting: Family Medicine

## 2017-04-04 ENCOUNTER — Telehealth: Payer: Self-pay | Admitting: *Deleted

## 2017-04-04 ENCOUNTER — Telehealth: Payer: Self-pay | Admitting: Family Medicine

## 2017-04-04 DIAGNOSIS — N186 End stage renal disease: Secondary | ICD-10-CM | POA: Diagnosis not present

## 2017-04-04 DIAGNOSIS — N2581 Secondary hyperparathyroidism of renal origin: Secondary | ICD-10-CM | POA: Diagnosis not present

## 2017-04-04 DIAGNOSIS — D500 Iron deficiency anemia secondary to blood loss (chronic): Secondary | ICD-10-CM | POA: Diagnosis not present

## 2017-04-04 DIAGNOSIS — E119 Type 2 diabetes mellitus without complications: Secondary | ICD-10-CM | POA: Diagnosis not present

## 2017-04-04 DIAGNOSIS — T8249XD Other complication of vascular dialysis catheter, subsequent encounter: Secondary | ICD-10-CM | POA: Diagnosis not present

## 2017-04-04 DIAGNOSIS — D631 Anemia in chronic kidney disease: Secondary | ICD-10-CM | POA: Diagnosis not present

## 2017-04-04 NOTE — Telephone Encounter (Signed)
Spoke with patient's daughter, Tilda Burrow. Deanna stated patient was at a previous facility and per the patient, she is able to go back. Daughter stated she will fax a form to our office to review exactly what those steps look like. Patient's daughter was again reminded Dr. Juleen China cannot admit the patient as she must go to inpatient admission for admit to SNF criteria. Awaiting faxed document.

## 2017-04-04 NOTE — Telephone Encounter (Signed)
Plans to speak with daughter. Left voicemail to contact office.

## 2017-04-04 NOTE — Telephone Encounter (Signed)
Spoke with patient's daughter.  She has spoken with someone at [facility].  Daughter doesn't remember who she spoke with.  She stated they told her the patient would have to live there.  Daughter states she wants patient only to go to rehab for 30 days.  Wanting order (admission) by Dr. Juleen China.    Per Dr. Juleen China, she will not do this.  We can write letter of recommendation but will not admit patient to SNF.  Alternative would be patient to go to the hospital and be admitted after hospital stay.

## 2017-04-04 NOTE — Telephone Encounter (Signed)
error 

## 2017-04-04 NOTE — Telephone Encounter (Signed)
Patient's daughter called in reference to e-mail sent 04/03/17. I informed her that the e-mail was received. Please call patient's daughter and advise ASAP on phone number [phone]. OK to leave message on this number if no answer.

## 2017-04-05 ENCOUNTER — Telehealth: Payer: Self-pay

## 2017-04-05 NOTE — Telephone Encounter (Signed)
Paperwork:  FL2 form.    Paperwork received by Trecia Rogers requesting form]:  Water quality scientist, CMA    Individual made aware of 3-5 business day turn around (Y/N):   Office form(s) completed and placed with paperwork (Y/N):   Form location: Lucina Mellow, RN's desk.

## 2017-04-06 DIAGNOSIS — D631 Anemia in chronic kidney disease: Secondary | ICD-10-CM | POA: Diagnosis not present

## 2017-04-06 DIAGNOSIS — E119 Type 2 diabetes mellitus without complications: Secondary | ICD-10-CM | POA: Diagnosis not present

## 2017-04-06 DIAGNOSIS — N2581 Secondary hyperparathyroidism of renal origin: Secondary | ICD-10-CM | POA: Diagnosis not present

## 2017-04-06 DIAGNOSIS — T8249XD Other complication of vascular dialysis catheter, subsequent encounter: Secondary | ICD-10-CM | POA: Diagnosis not present

## 2017-04-06 DIAGNOSIS — N186 End stage renal disease: Secondary | ICD-10-CM | POA: Diagnosis not present

## 2017-04-06 DIAGNOSIS — D500 Iron deficiency anemia secondary to blood loss (chronic): Secondary | ICD-10-CM | POA: Diagnosis not present

## 2017-04-09 ENCOUNTER — Encounter: Payer: Self-pay | Admitting: Family Medicine

## 2017-04-09 DIAGNOSIS — D631 Anemia in chronic kidney disease: Secondary | ICD-10-CM | POA: Diagnosis not present

## 2017-04-09 DIAGNOSIS — T8249XD Other complication of vascular dialysis catheter, subsequent encounter: Secondary | ICD-10-CM | POA: Diagnosis not present

## 2017-04-09 DIAGNOSIS — N2581 Secondary hyperparathyroidism of renal origin: Secondary | ICD-10-CM | POA: Diagnosis not present

## 2017-04-09 DIAGNOSIS — N186 End stage renal disease: Secondary | ICD-10-CM | POA: Diagnosis not present

## 2017-04-09 DIAGNOSIS — D500 Iron deficiency anemia secondary to blood loss (chronic): Secondary | ICD-10-CM | POA: Diagnosis not present

## 2017-04-09 DIAGNOSIS — E119 Type 2 diabetes mellitus without complications: Secondary | ICD-10-CM | POA: Diagnosis not present

## 2017-04-09 NOTE — Telephone Encounter (Signed)
Spoke with Crecencio Mc at [facility].  She states once FL2 form is filled out and faxed, it will be reviewed by management there for approval.  States Ms. Anschutz was a resident there in the past and she doesn't see any reason patient will be declined.  Advised that patient's sister contacted them and wants patient to be there long term.  They do not have a long term bed at this time.  Ms. Teressa Lower also advised that if patient is accepted long term, Dr. Juleen China will no longer have responsibility as her PCP.  They have their own physician there who would handle patient's care.

## 2017-04-09 NOTE — Telephone Encounter (Signed)
FL-2 form completed and faxed to [facility]

## 2017-04-09 NOTE — Telephone Encounter (Signed)
Paperwork location: Deborah Hendricks's desk    Status: currently attempting to complete. Plans to fax this afternoon to facility.

## 2017-04-11 DIAGNOSIS — E1122 Type 2 diabetes mellitus with diabetic chronic kidney disease: Secondary | ICD-10-CM | POA: Diagnosis not present

## 2017-04-11 DIAGNOSIS — D631 Anemia in chronic kidney disease: Secondary | ICD-10-CM | POA: Diagnosis not present

## 2017-04-11 DIAGNOSIS — Z992 Dependence on renal dialysis: Secondary | ICD-10-CM | POA: Diagnosis not present

## 2017-04-11 DIAGNOSIS — N2581 Secondary hyperparathyroidism of renal origin: Secondary | ICD-10-CM | POA: Diagnosis not present

## 2017-04-11 DIAGNOSIS — E119 Type 2 diabetes mellitus without complications: Secondary | ICD-10-CM | POA: Diagnosis not present

## 2017-04-11 DIAGNOSIS — D500 Iron deficiency anemia secondary to blood loss (chronic): Secondary | ICD-10-CM | POA: Diagnosis not present

## 2017-04-11 DIAGNOSIS — T8249XD Other complication of vascular dialysis catheter, subsequent encounter: Secondary | ICD-10-CM | POA: Diagnosis not present

## 2017-04-11 DIAGNOSIS — N186 End stage renal disease: Secondary | ICD-10-CM | POA: Diagnosis not present
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
## 2017-04-11 NOTE — Telephone Encounter (Signed)
Attempted to contact patient. Pending call back. Will send FL-2 form to facility as [facility] only allows verbal patient information to be sent to daughter.

## 2017-04-11 NOTE — Telephone Encounter (Signed)
Left MyChart message for daughter

## 2017-04-12 ENCOUNTER — Encounter (HOSPITAL_BASED_OUTPATIENT_CLINIC_OR_DEPARTMENT_OTHER): Payer: Medicare Other | Attending: Internal Medicine

## 2017-04-12 ENCOUNTER — Encounter: Payer: Self-pay | Admitting: Family Medicine

## 2017-04-12 DIAGNOSIS — E1122 Type 2 diabetes mellitus with diabetic chronic kidney disease: Secondary | ICD-10-CM | POA: Diagnosis not present

## 2017-04-12 DIAGNOSIS — L97512 Non-pressure chronic ulcer of other part of right foot with fat layer exposed: Secondary | ICD-10-CM | POA: Diagnosis not present

## 2017-04-12 DIAGNOSIS — E1151 Type 2 diabetes mellitus with diabetic peripheral angiopathy without gangrene: Secondary | ICD-10-CM | POA: Insufficient documentation

## 2017-04-12 DIAGNOSIS — E11621 Type 2 diabetes mellitus with foot ulcer: Secondary | ICD-10-CM | POA: Diagnosis not present

## 2017-04-12 DIAGNOSIS — N186 End stage renal disease: Secondary | ICD-10-CM | POA: Insufficient documentation

## 2017-04-12 DIAGNOSIS — E1140 Type 2 diabetes mellitus with diabetic neuropathy, unspecified: Secondary | ICD-10-CM | POA: Diagnosis not present

## 2017-04-12 NOTE — Telephone Encounter (Signed)
Called and spoke with patients daughter. Verified place and fax number to send FL2. Paperwork faxed to [place].

## 2017-04-12 NOTE — Telephone Encounter (Signed)
[place]  FL2 Form needs to be faxed. [place] Mon Fax: [phone] P: [phone]  Patient will be moving to [place] so please disregard last Mychart message.   Trinda Pascal, daughter Cell: [phone] cell

## 2017-04-12 NOTE — Telephone Encounter (Signed)
Patient's daughter Tilda Burrow returning Michelle's phone call, transferred to [place].

## 2017-04-13 DIAGNOSIS — N2581 Secondary hyperparathyroidism of renal origin: Secondary | ICD-10-CM | POA: Diagnosis not present

## 2017-04-13 DIAGNOSIS — E119 Type 2 diabetes mellitus without complications: Secondary | ICD-10-CM | POA: Diagnosis not present

## 2017-04-13 DIAGNOSIS — D500 Iron deficiency anemia secondary to blood loss (chronic): Secondary | ICD-10-CM | POA: Diagnosis not present

## 2017-04-13 DIAGNOSIS — N186 End stage renal disease: Secondary | ICD-10-CM | POA: Diagnosis not present

## 2017-04-13 DIAGNOSIS — D631 Anemia in chronic kidney disease: Secondary | ICD-10-CM | POA: Diagnosis not present

## 2017-04-13 DIAGNOSIS — T8249XD Other complication of vascular dialysis catheter, subsequent encounter: Secondary | ICD-10-CM | POA: Diagnosis not present

## 2017-04-15 NOTE — Telephone Encounter (Addendum)
Patient's daughter called to advise that she needs another FL2 form send to [facility], attn: Rosario Adie. Fax number: [phone]. Please contact once this has been done.   Please call Deanna as soon as this is faxed and a confirmation come through, this is time sensitive, no other steps can be taken until this is faxed.

## 2017-04-15 NOTE — Telephone Encounter (Signed)
ATT: Rosario Adie. Fax number: [phone]  ATT: Ludie Love Fax: [phone]  Please fax FL2 to both individuals. Daughter has called numerous times regarding this concern and it is time sensitive. These faxes are going to skilled nursing facilities.  Thank you,  -LL

## 2017-04-15 NOTE — Telephone Encounter (Signed)
Patient's daughter Tilda Burrow called again to ensure that the [facility] forms have been sent. I transferred the call to [place] for her to speak directly with Deanna on why Sharyn Lull has to be the one to execute this process. Deanna was verbally expressed her frustrations. [place] stated she would speak to [facility].

## 2017-04-15 NOTE — Telephone Encounter (Signed)
Patients daughter called demanding FL2 be sent to additional facilities. I informed patient that paper work was in [employer] office and I would be sure to make a note for her to respond to upon her return tomorrow. Patient abruptly hung up phone.

## 2017-04-16 DIAGNOSIS — D500 Iron deficiency anemia secondary to blood loss (chronic): Secondary | ICD-10-CM | POA: Diagnosis not present

## 2017-04-16 DIAGNOSIS — D631 Anemia in chronic kidney disease: Secondary | ICD-10-CM | POA: Diagnosis not present

## 2017-04-16 DIAGNOSIS — E119 Type 2 diabetes mellitus without complications: Secondary | ICD-10-CM | POA: Diagnosis not present

## 2017-04-16 DIAGNOSIS — T8249XD Other complication of vascular dialysis catheter, subsequent encounter: Secondary | ICD-10-CM | POA: Diagnosis not present

## 2017-04-16 DIAGNOSIS — N186 End stage renal disease: Secondary | ICD-10-CM | POA: Diagnosis not present

## 2017-04-16 DIAGNOSIS — N2581 Secondary hyperparathyroidism of renal origin: Secondary | ICD-10-CM | POA: Diagnosis not present

## 2017-04-16 NOTE — Telephone Encounter (Signed)
Spoke with daughter, [name]. Made daughter aware of the below topics:  -[facility] is a culture of mutual respect, both from staff and patients.   -FL-2 form was faxed to [facility] facility (confirmed phone #).   -We are aware mother and daughter are having difficulty finding placement, however, want to ensure we are focusing on facilities which mother is likely to be accepted (minimizing number of locations being sent FL-2 forms for HIPAA purposes).   -Form management is often 3-5 business days and due to their time-sensitive circumstances, we are doing our best to accommodate their needs.

## 2017-04-17 DIAGNOSIS — N186 End stage renal disease: Secondary | ICD-10-CM | POA: Diagnosis not present

## 2017-04-17 DIAGNOSIS — E0822 Diabetes mellitus due to underlying condition with diabetic chronic kidney disease: Secondary | ICD-10-CM | POA: Diagnosis not present

## 2017-04-17 DIAGNOSIS — I10 Essential (primary) hypertension: Secondary | ICD-10-CM | POA: Diagnosis not present

## 2017-04-17 DIAGNOSIS — R293 Abnormal posture: Secondary | ICD-10-CM | POA: Diagnosis not present

## 2017-04-17 DIAGNOSIS — I739 Peripheral vascular disease, unspecified: Secondary | ICD-10-CM | POA: Diagnosis not present

## 2017-04-17 DIAGNOSIS — G459 Transient cerebral ischemic attack, unspecified: Secondary | ICD-10-CM | POA: Diagnosis not present

## 2017-04-17 DIAGNOSIS — E1169 Type 2 diabetes mellitus with other specified complication: Secondary | ICD-10-CM | POA: Diagnosis not present

## 2017-04-18 DIAGNOSIS — T8249XD Other complication of vascular dialysis catheter, subsequent encounter: Secondary | ICD-10-CM | POA: Diagnosis not present

## 2017-04-18 DIAGNOSIS — D500 Iron deficiency anemia secondary to blood loss (chronic): Secondary | ICD-10-CM | POA: Diagnosis not present

## 2017-04-18 DIAGNOSIS — E119 Type 2 diabetes mellitus without complications: Secondary | ICD-10-CM | POA: Diagnosis not present

## 2017-04-18 DIAGNOSIS — D631 Anemia in chronic kidney disease: Secondary | ICD-10-CM | POA: Diagnosis not present

## 2017-04-18 DIAGNOSIS — N2581 Secondary hyperparathyroidism of renal origin: Secondary | ICD-10-CM | POA: Diagnosis not present

## 2017-04-18 DIAGNOSIS — N186 End stage renal disease: Secondary | ICD-10-CM | POA: Diagnosis not present

## 2017-04-18 DIAGNOSIS — R293 Abnormal posture: Secondary | ICD-10-CM | POA: Diagnosis not present

## 2017-04-19 DIAGNOSIS — R293 Abnormal posture: Secondary | ICD-10-CM | POA: Diagnosis not present

## 2017-04-19 DIAGNOSIS — N186 End stage renal disease: Secondary | ICD-10-CM | POA: Diagnosis not present

## 2017-04-20 DIAGNOSIS — D500 Iron deficiency anemia secondary to blood loss (chronic): Secondary | ICD-10-CM | POA: Diagnosis not present

## 2017-04-20 DIAGNOSIS — D631 Anemia in chronic kidney disease: Secondary | ICD-10-CM | POA: Diagnosis not present

## 2017-04-20 DIAGNOSIS — T8249XD Other complication of vascular dialysis catheter, subsequent encounter: Secondary | ICD-10-CM | POA: Diagnosis not present

## 2017-04-20 DIAGNOSIS — N186 End stage renal disease: Secondary | ICD-10-CM | POA: Diagnosis not present

## 2017-04-20 DIAGNOSIS — E119 Type 2 diabetes mellitus without complications: Secondary | ICD-10-CM | POA: Diagnosis not present

## 2017-04-20 DIAGNOSIS — N2581 Secondary hyperparathyroidism of renal origin: Secondary | ICD-10-CM | POA: Diagnosis not present

## 2017-04-22 DIAGNOSIS — N186 End stage renal disease: Secondary | ICD-10-CM | POA: Diagnosis not present

## 2017-04-22 DIAGNOSIS — R293 Abnormal posture: Secondary | ICD-10-CM | POA: Diagnosis not present

## 2017-04-23 DIAGNOSIS — D500 Iron deficiency anemia secondary to blood loss (chronic): Secondary | ICD-10-CM | POA: Diagnosis not present

## 2017-04-23 DIAGNOSIS — D631 Anemia in chronic kidney disease: Secondary | ICD-10-CM | POA: Diagnosis not present

## 2017-04-23 DIAGNOSIS — N2581 Secondary hyperparathyroidism of renal origin: Secondary | ICD-10-CM | POA: Diagnosis not present

## 2017-04-23 DIAGNOSIS — R293 Abnormal posture: Secondary | ICD-10-CM | POA: Diagnosis not present

## 2017-04-23 DIAGNOSIS — N186 End stage renal disease: Secondary | ICD-10-CM | POA: Diagnosis not present

## 2017-04-23 DIAGNOSIS — E119 Type 2 diabetes mellitus without complications: Secondary | ICD-10-CM | POA: Diagnosis not present

## 2017-04-23 DIAGNOSIS — T8249XD Other complication of vascular dialysis catheter, subsequent encounter: Secondary | ICD-10-CM | POA: Diagnosis not present

## 2017-04-24 DIAGNOSIS — N186 End stage renal disease: Secondary | ICD-10-CM | POA: Diagnosis not present

## 2017-04-24 DIAGNOSIS — R293 Abnormal posture: Secondary | ICD-10-CM | POA: Diagnosis not present

## 2017-04-25 DIAGNOSIS — E119 Type 2 diabetes mellitus without complications: Secondary | ICD-10-CM | POA: Diagnosis not present

## 2017-04-25 DIAGNOSIS — N2581 Secondary hyperparathyroidism of renal origin: Secondary | ICD-10-CM | POA: Diagnosis not present

## 2017-04-25 DIAGNOSIS — D500 Iron deficiency anemia secondary to blood loss (chronic): Secondary | ICD-10-CM | POA: Diagnosis not present

## 2017-04-25 DIAGNOSIS — D631 Anemia in chronic kidney disease: Secondary | ICD-10-CM | POA: Diagnosis not present

## 2017-04-25 DIAGNOSIS — R293 Abnormal posture: Secondary | ICD-10-CM | POA: Diagnosis not present

## 2017-04-25 DIAGNOSIS — T8249XD Other complication of vascular dialysis catheter, subsequent encounter: Secondary | ICD-10-CM | POA: Diagnosis not present

## 2017-04-25 DIAGNOSIS — N186 End stage renal disease: Secondary | ICD-10-CM | POA: Diagnosis not present

## 2017-04-26 DIAGNOSIS — E11621 Type 2 diabetes mellitus with foot ulcer: Secondary | ICD-10-CM | POA: Diagnosis not present

## 2017-04-26 DIAGNOSIS — E1122 Type 2 diabetes mellitus with diabetic chronic kidney disease: Secondary | ICD-10-CM | POA: Diagnosis not present

## 2017-04-26 DIAGNOSIS — R293 Abnormal posture: Secondary | ICD-10-CM | POA: Diagnosis not present

## 2017-04-26 DIAGNOSIS — N186 End stage renal disease: Secondary | ICD-10-CM | POA: Diagnosis not present

## 2017-04-26 DIAGNOSIS — E1140 Type 2 diabetes mellitus with diabetic neuropathy, unspecified: Secondary | ICD-10-CM | POA: Diagnosis not present

## 2017-04-26 DIAGNOSIS — L97512 Non-pressure chronic ulcer of other part of right foot with fat layer exposed: Secondary | ICD-10-CM | POA: Diagnosis not present

## 2017-04-26 DIAGNOSIS — E1151 Type 2 diabetes mellitus with diabetic peripheral angiopathy without gangrene: Secondary | ICD-10-CM | POA: Diagnosis not present

## 2017-04-27 DIAGNOSIS — D500 Iron deficiency anemia secondary to blood loss (chronic): Secondary | ICD-10-CM | POA: Diagnosis not present

## 2017-04-27 DIAGNOSIS — D631 Anemia in chronic kidney disease: Secondary | ICD-10-CM | POA: Diagnosis not present

## 2017-04-27 DIAGNOSIS — N186 End stage renal disease: Secondary | ICD-10-CM | POA: Diagnosis not present

## 2017-04-27 DIAGNOSIS — T8249XD Other complication of vascular dialysis catheter, subsequent encounter: Secondary | ICD-10-CM | POA: Diagnosis not present

## 2017-04-27 DIAGNOSIS — E119 Type 2 diabetes mellitus without complications: Secondary | ICD-10-CM | POA: Diagnosis not present

## 2017-04-27 DIAGNOSIS — N2581 Secondary hyperparathyroidism of renal origin: Secondary | ICD-10-CM | POA: Diagnosis not present

## 2017-04-29 DIAGNOSIS — N186 End stage renal disease: Secondary | ICD-10-CM | POA: Diagnosis not present

## 2017-04-29 DIAGNOSIS — R293 Abnormal posture: Secondary | ICD-10-CM | POA: Diagnosis not present

## 2017-04-30 DIAGNOSIS — T8249XD Other complication of vascular dialysis catheter, subsequent encounter: Secondary | ICD-10-CM | POA: Diagnosis not present

## 2017-04-30 DIAGNOSIS — R293 Abnormal posture: Secondary | ICD-10-CM | POA: Diagnosis not present

## 2017-04-30 DIAGNOSIS — N2581 Secondary hyperparathyroidism of renal origin: Secondary | ICD-10-CM | POA: Diagnosis not present

## 2017-04-30 DIAGNOSIS — N186 End stage renal disease: Secondary | ICD-10-CM | POA: Diagnosis not present

## 2017-04-30 DIAGNOSIS — E119 Type 2 diabetes mellitus without complications: Secondary | ICD-10-CM | POA: Diagnosis not present

## 2017-04-30 DIAGNOSIS — D631 Anemia in chronic kidney disease: Secondary | ICD-10-CM | POA: Diagnosis not present

## 2017-04-30 DIAGNOSIS — D500 Iron deficiency anemia secondary to blood loss (chronic): Secondary | ICD-10-CM | POA: Diagnosis not present

## 2017-05-01 DIAGNOSIS — R293 Abnormal posture: Secondary | ICD-10-CM | POA: Diagnosis not present

## 2017-05-01 DIAGNOSIS — N186 End stage renal disease: Secondary | ICD-10-CM | POA: Diagnosis not present

## 2017-05-02 ENCOUNTER — Encounter: Payer: Self-pay | Admitting: Surgery

## 2017-05-02 DIAGNOSIS — N186 End stage renal disease: Secondary | ICD-10-CM | POA: Diagnosis not present

## 2017-05-02 DIAGNOSIS — D500 Iron deficiency anemia secondary to blood loss (chronic): Secondary | ICD-10-CM | POA: Diagnosis not present

## 2017-05-02 DIAGNOSIS — D631 Anemia in chronic kidney disease: Secondary | ICD-10-CM | POA: Diagnosis not present

## 2017-05-02 DIAGNOSIS — T8249XD Other complication of vascular dialysis catheter, subsequent encounter: Secondary | ICD-10-CM | POA: Diagnosis not present

## 2017-05-02 DIAGNOSIS — E119 Type 2 diabetes mellitus without complications: Secondary | ICD-10-CM | POA: Diagnosis not present

## 2017-05-02 DIAGNOSIS — N2581 Secondary hyperparathyroidism of renal origin: Secondary | ICD-10-CM | POA: Diagnosis not present

## 2017-05-02 DIAGNOSIS — R293 Abnormal posture: Secondary | ICD-10-CM | POA: Diagnosis not present

## 2017-05-03 ENCOUNTER — Other Ambulatory Visit (HOSPITAL_COMMUNITY)
Admission: RE | Admit: 2017-05-03 | Discharge: 2017-05-03 | Disposition: A | Discharge status: Home / Self Care | Payer: Medicare Other | Source: Other Acute Inpatient Hospital | Attending: Internal Medicine | Admitting: Internal Medicine

## 2017-05-03 DIAGNOSIS — R293 Abnormal posture: Secondary | ICD-10-CM | POA: Diagnosis not present

## 2017-05-03 DIAGNOSIS — E1151 Type 2 diabetes mellitus with diabetic peripheral angiopathy without gangrene: Secondary | ICD-10-CM | POA: Diagnosis not present

## 2017-05-03 DIAGNOSIS — N186 End stage renal disease: Secondary | ICD-10-CM | POA: Diagnosis not present

## 2017-05-03 DIAGNOSIS — L97512 Non-pressure chronic ulcer of other part of right foot with fat layer exposed: Secondary | ICD-10-CM | POA: Diagnosis not present

## 2017-05-03 DIAGNOSIS — E1140 Type 2 diabetes mellitus with diabetic neuropathy, unspecified: Secondary | ICD-10-CM | POA: Diagnosis not present

## 2017-05-03 DIAGNOSIS — E1122 Type 2 diabetes mellitus with diabetic chronic kidney disease: Secondary | ICD-10-CM | POA: Diagnosis not present

## 2017-05-03 DIAGNOSIS — E11621 Type 2 diabetes mellitus with foot ulcer: Secondary | ICD-10-CM | POA: Diagnosis not present

## 2017-05-04 DIAGNOSIS — D631 Anemia in chronic kidney disease: Secondary | ICD-10-CM | POA: Diagnosis not present

## 2017-05-04 DIAGNOSIS — N2581 Secondary hyperparathyroidism of renal origin: Secondary | ICD-10-CM | POA: Diagnosis not present

## 2017-05-04 DIAGNOSIS — T8249XD Other complication of vascular dialysis catheter, subsequent encounter: Secondary | ICD-10-CM | POA: Diagnosis not present

## 2017-05-04 DIAGNOSIS — E119 Type 2 diabetes mellitus without complications: Secondary | ICD-10-CM | POA: Diagnosis not present

## 2017-05-04 DIAGNOSIS — N186 End stage renal disease: Secondary | ICD-10-CM | POA: Diagnosis not present

## 2017-05-04 DIAGNOSIS — D500 Iron deficiency anemia secondary to blood loss (chronic): Secondary | ICD-10-CM | POA: Diagnosis not present

## 2017-05-04 DIAGNOSIS — R293 Abnormal posture: Secondary | ICD-10-CM | POA: Diagnosis not present

## 2017-05-06 ENCOUNTER — Encounter: Payer: Self-pay | Admitting: Internal Medicine

## 2017-05-06 DIAGNOSIS — N186 End stage renal disease: Secondary | ICD-10-CM | POA: Diagnosis not present

## 2017-05-06 DIAGNOSIS — R293 Abnormal posture: Secondary | ICD-10-CM | POA: Diagnosis not present

## 2017-05-06 LAB — AEROBIC CULTURE  (SUPERFICIAL SPECIMEN)

## 2017-05-06 LAB — AEROBIC CULTURE W GRAM STAIN (SUPERFICIAL SPECIMEN)

## 2017-05-07 DIAGNOSIS — T8249XD Other complication of vascular dialysis catheter, subsequent encounter: Secondary | ICD-10-CM | POA: Diagnosis not present

## 2017-05-07 DIAGNOSIS — N186 End stage renal disease: Secondary | ICD-10-CM | POA: Diagnosis not present

## 2017-05-07 DIAGNOSIS — D631 Anemia in chronic kidney disease: Secondary | ICD-10-CM | POA: Diagnosis not present

## 2017-05-07 DIAGNOSIS — R293 Abnormal posture: Secondary | ICD-10-CM | POA: Diagnosis not present

## 2017-05-07 DIAGNOSIS — D500 Iron deficiency anemia secondary to blood loss (chronic): Secondary | ICD-10-CM | POA: Diagnosis not present

## 2017-05-07 DIAGNOSIS — N2581 Secondary hyperparathyroidism of renal origin: Secondary | ICD-10-CM | POA: Diagnosis not present

## 2017-05-07 DIAGNOSIS — E119 Type 2 diabetes mellitus without complications: Secondary | ICD-10-CM | POA: Diagnosis not present

## 2017-05-08 DIAGNOSIS — N186 End stage renal disease: Secondary | ICD-10-CM | POA: Diagnosis not present

## 2017-05-08 DIAGNOSIS — R293 Abnormal posture: Secondary | ICD-10-CM | POA: Diagnosis not present

## 2017-05-09 DIAGNOSIS — N2581 Secondary hyperparathyroidism of renal origin: Secondary | ICD-10-CM | POA: Diagnosis not present

## 2017-05-09 DIAGNOSIS — E119 Type 2 diabetes mellitus without complications: Secondary | ICD-10-CM | POA: Diagnosis not present

## 2017-05-09 DIAGNOSIS — D631 Anemia in chronic kidney disease: Secondary | ICD-10-CM | POA: Diagnosis not present

## 2017-05-09 DIAGNOSIS — R293 Abnormal posture: Secondary | ICD-10-CM | POA: Diagnosis not present

## 2017-05-09 DIAGNOSIS — T8249XD Other complication of vascular dialysis catheter, subsequent encounter: Secondary | ICD-10-CM | POA: Diagnosis not present

## 2017-05-09 DIAGNOSIS — D500 Iron deficiency anemia secondary to blood loss (chronic): Secondary | ICD-10-CM | POA: Diagnosis not present

## 2017-05-09 DIAGNOSIS — N186 End stage renal disease: Secondary | ICD-10-CM | POA: Diagnosis not present

## 2017-05-10 DIAGNOSIS — N186 End stage renal disease: Secondary | ICD-10-CM | POA: Diagnosis not present

## 2017-05-10 DIAGNOSIS — R293 Abnormal posture: Secondary | ICD-10-CM | POA: Diagnosis not present

## 2017-05-11 DIAGNOSIS — N186 End stage renal disease: Secondary | ICD-10-CM | POA: Diagnosis not present

## 2017-05-11 DIAGNOSIS — T8249XD Other complication of vascular dialysis catheter, subsequent encounter: Secondary | ICD-10-CM | POA: Diagnosis not present

## 2017-05-11 DIAGNOSIS — D500 Iron deficiency anemia secondary to blood loss (chronic): Secondary | ICD-10-CM | POA: Diagnosis not present

## 2017-05-11 DIAGNOSIS — N2581 Secondary hyperparathyroidism of renal origin: Secondary | ICD-10-CM | POA: Diagnosis not present

## 2017-05-11 DIAGNOSIS — E119 Type 2 diabetes mellitus without complications: Secondary | ICD-10-CM | POA: Diagnosis not present

## 2017-05-11 DIAGNOSIS — Z992 Dependence on renal dialysis: Secondary | ICD-10-CM | POA: Diagnosis not present

## 2017-05-11 DIAGNOSIS — D631 Anemia in chronic kidney disease: Secondary | ICD-10-CM | POA: Diagnosis not present

## 2017-05-11 DIAGNOSIS — E1122 Type 2 diabetes mellitus with diabetic chronic kidney disease: Secondary | ICD-10-CM | POA: Diagnosis not present

## 2017-05-13 ENCOUNTER — Ambulatory Visit (HOSPITAL_COMMUNITY)
Admission: RE | Admit: 2017-05-13 | Discharge: 2017-05-13 | Disposition: A | Discharge status: Home / Self Care | Payer: Medicare Other | Source: Ambulatory Visit | Attending: Surgery | Admitting: Surgery

## 2017-05-13 ENCOUNTER — Other Ambulatory Visit: Payer: Self-pay | Admitting: *Deleted

## 2017-05-13 ENCOUNTER — Encounter: Payer: Self-pay | Admitting: Surgery

## 2017-05-13 ENCOUNTER — Encounter: Payer: Self-pay | Admitting: *Deleted

## 2017-05-13 ENCOUNTER — Ambulatory Visit (INDEPENDENT_AMBULATORY_CARE_PROVIDER_SITE_OTHER): Payer: Medicare Other | Admitting: Surgery

## 2017-05-13 VITALS — BP 207/103 | HR 69 | Temp 97.9°F | Resp 20 | Ht 66.0 in | Wt 153.0 lb

## 2017-05-13 DIAGNOSIS — R0989 Other specified symptoms and signs involving the circulatory and respiratory systems: Secondary | ICD-10-CM | POA: Insufficient documentation

## 2017-05-13 DIAGNOSIS — I10 Essential (primary) hypertension: Secondary | ICD-10-CM | POA: Diagnosis not present

## 2017-05-13 DIAGNOSIS — L97911 Non-pressure chronic ulcer of unspecified part of right lower leg limited to breakdown of skin: Secondary | ICD-10-CM

## 2017-05-13 DIAGNOSIS — Z89612 Acquired absence of left leg above knee: Secondary | ICD-10-CM | POA: Insufficient documentation

## 2017-05-13 DIAGNOSIS — N186 End stage renal disease: Secondary | ICD-10-CM | POA: Diagnosis not present

## 2017-05-13 DIAGNOSIS — R41841 Cognitive communication deficit: Secondary | ICD-10-CM | POA: Diagnosis not present

## 2017-05-13 DIAGNOSIS — E785 Hyperlipidemia, unspecified: Secondary | ICD-10-CM | POA: Diagnosis not present

## 2017-05-13 DIAGNOSIS — I639 Cerebral infarction, unspecified: Secondary | ICD-10-CM | POA: Diagnosis not present

## 2017-05-13 DIAGNOSIS — Z7409 Other reduced mobility: Secondary | ICD-10-CM | POA: Diagnosis not present

## 2017-05-13 DIAGNOSIS — R293 Abnormal posture: Secondary | ICD-10-CM | POA: Diagnosis not present

## 2017-05-13 DIAGNOSIS — L02611 Cutaneous abscess of right foot: Secondary | ICD-10-CM | POA: Insufficient documentation

## 2017-05-13 DIAGNOSIS — R1312 Dysphagia, oropharyngeal phase: Secondary | ICD-10-CM | POA: Diagnosis not present

## 2017-05-13 DIAGNOSIS — E119 Type 2 diabetes mellitus without complications: Secondary | ICD-10-CM | POA: Diagnosis not present

## 2017-05-13 DIAGNOSIS — I69122 Dysarthria following nontraumatic intracerebral hemorrhage: Secondary | ICD-10-CM | POA: Diagnosis not present

## 2017-05-13 DIAGNOSIS — Z87891 Personal history of nicotine dependence: Secondary | ICD-10-CM | POA: Insufficient documentation

## 2017-05-13 DIAGNOSIS — R278 Other lack of coordination: Secondary | ICD-10-CM | POA: Diagnosis not present

## 2017-05-13 NOTE — Progress Notes (Signed)
[facility] [place]  Patient name: Deborah Hendricks MRN: [id number] DOB: Aug 08, 1946 Sex: female   REASON FOR VISIT:    Follow up  HISOTRY OF PRESENT ILLNESS:    Deborah Hendricks a 71 y.o.femalewho returns today for evaluation of a right leg ulcer. This has been present for many weeks. She is status post left above-knee amputation on 12/15/2015. She also suffers from end-stage renal disease. She has undergone a basilic vein procedure which has not developed. She has a catheter in her right thigh. He has a history of stroke. She is on anticoagulation. She suffers from diabetes with multiple complications.  On 11/27/2016 she underwent atherectomy and drug coated balloon angioplasty of the superficial femoral and popliteal artery on the right.  She also had balloon angioplasty of the stenosis within the tibioperoneal trunk and proximal peroneal artery.  She has single-vessel runoff via the peroneal artery.  She comes in today stating that her wound on her right great toe has not changed significantly.  She still dialyzes through her right femoral catheter.  When we last discussed dialysis access, she wanted to go back to [place].  Unfortunately she is struggling with some family members regarding these issues.   PAST MEDICAL HISTORY:   Past Medical History:  Diagnosis Date  . Anemia   . Anxiety   . CHF (congestive heart failure) ([place])   . Constipation   . Critical lower limb ischemia   . Depression   . ESRD on dialysis [facility])    "TTS; [place]" (11/27/2016)  . GERD (gastroesophageal reflux disease)   . Heart murmur   . HLD (hyperlipidemia)   . Hypertension   . Memory loss   . Peripheral vascular disease ([place])   . Refusal of blood transfusions as patient is Jehovah's Witness   . Seizures ([place]) 04/2015   s/p CVA.  [name] Sleep apnea    Noncompliant with CPAP.  [name] Stroke ([place]) 2015  . Type II diabetes mellitus  ([place])      FAMILY HISTORY:   Family History  Problem Relation Age of Onset  . Asthma Mother   . Cancer Sister     SOCIAL HISTORY:   Social History  Substance Use Topics  . Smoking status: Former Smoker    Packs/day: 0.25    Years: 51.00    Types: Cigarettes    Quit date: 04/13/2015  . Smokeless tobacco: Never Used  . Alcohol use 0.0 oz/week     Comment: 11/27/2016 "stopped in 04/2015"     ALLERGIES:   Allergies  Allergen Reactions  . Penicillins Hives, Itching, Rash and Other (See Comments)    Has patient had a PCN reaction causing immediate rash, facial/tongue/throat swelling, SOB or lightheadedness with hypotension: Yes Has patient had a PCN reaction causing severe rash involving mucus membranes or skin necrosis:  Has patient had a PCN reaction that required hospitalization  Has patient had a PCN reaction occurring within the last 10 years:  If all of the above answers are "NO", then may proceed with Cephalosporin use.  [name] Amoxicillin Hives, Itching and Rash  . Latex Rash  . Sulfa Antibiotics Other (See Comments)    Unknown      CURRENT MEDICATIONS:   Current Outpatient Prescriptions  Medication Sig Dispense Refill  . Amino Acids-Protein Hydrolys (FEEDING SUPPLEMENT, PRO-STAT SUGAR FREE 64,) LIQD Take 30 mLs by mouth daily. 900 mL 0  . apixaban (ELIQUIS) 2.5 MG TABS tablet Take 1 tablet (2.5 mg total)  by mouth 2 (two) times daily. 60 tablet 3  . aspirin EC 81 MG tablet Take 1 tablet (81 mg total) by mouth daily. 60 tablet 1  . atorvastatin (LIPITOR) 40 MG tablet Take 1 tablet (40 mg total) by mouth daily. 30 tablet 3  . calcium acetate (PHOSLO) 667 MG capsule Take 667 mg by mouth 3 (three) times daily with meals.    . carvedilol (COREG) 3.125 MG tablet Take 1 tablet (3.125 mg total) by mouth 2 (two) times daily with a meal. (Patient taking differently: Take 3.125 mg by mouth 2 (two) times daily. Take 3.125 mg by mouth daily on Tuesday, Thursday and Saturday after  dialysis. Take 3.125 mg by mouth twice daily on all other days) 60 tablet 1  . citalopram (CELEXA) 10 MG tablet Take 10 mg by mouth daily.  3  . doxycycline (VIBRAMYCIN) 100 MG capsule Take 1 capsule by mouth 2 (two) times daily.  0  . esomeprazole (NEXIUM) 20 MG capsule Take 1 capsule (20 mg total) by mouth daily. 90 capsule 3  . fluconazole (DIFLUCAN) 50 MG tablet Take 1 tablet (50 mg total) by mouth daily. 1 tablet 0  . fluticasone (FLONASE) 50 MCG/ACT nasal spray Place 2 sprays into both nostrils daily.    [name] gabapentin (NEURONTIN) 100 MG capsule Take 1 capsule (100 mg total) by mouth at bedtime. 30 capsule 0  . insulin aspart (NOVOLOG) 100 UNIT/ML injection Inject 2-10 Units into the skin 3 (three) times daily as needed for high blood sugar. 151-200=2 units, 201-250=4 units, 251-300=6 units, 301-350=8 units, 351-400=10 units, >400=10 units and Call MD    . insulin glargine (LANTUS) 100 UNIT/ML injection Inject 2 Units into the skin as needed (Daughter states she only uses it as needed).     . multivitamin (RENA-VIT) TABS tablet Take 1 tablet by mouth daily.    . Nutritional Supplements (FEEDING SUPPLEMENT, NEPRO CARB STEADY,) LIQD Take 237 mLs by mouth daily. 237 mL 12  . ondansetron (ZOFRAN ODT) 4 MG disintegrating tablet Take 1 tablet (4 mg total) by mouth every 8 (eight) hours as needed for nausea or vomiting. 20 tablet 0  . ondansetron (ZOFRAN) 4 MG tablet Take 4 mg by mouth every 8 (eight) hours as needed for nausea or vomiting.    . psyllium (REGULOID) 0.52 g capsule One po tid prn constipation. 90 capsule 2   No current facility-administered medications for this visit.     REVIEW OF SYSTEMS:   [X]  denotes positive finding, [ ]  denotes negative finding Cardiac  Comments:  Chest pain or chest pressure:    Shortness of breath upon exertion:    Short of breath when lying flat:    Irregular heart rhythm:        Vascular    Pain in calf, thigh, or hip brought on by ambulation:      Pain in feet at night that wakes you up from your sleep:     Blood clot in your veins:    Leg swelling:         Pulmonary    Oxygen at home:    Productive cough:     Wheezing:         Neurologic    Sudden weakness in arms or legs:     Sudden numbness in arms or legs:     Sudden onset of difficulty speaking or slurred speech:    Temporary loss of vision in one eye:     Problems with  dizziness:         Gastrointestinal    Blood in stool:     Vomited blood:         Genitourinary    Burning when urinating:     Blood in urine:        Psychiatric    Major depression:         Hematologic    Bleeding problems:    Problems with blood clotting too easily:        Skin    Rashes or ulcers: x       Constitutional    Fever or chills:      PHYSICAL EXAM:   Vitals:   05/13/17 1356 05/13/17 1433  BP: (!) 188/103 (!) 207/103  Pulse: 69   Resp: 20   Temp: 97.9 F (36.6 C)   TempSrc: Oral   SpO2: 98%   Weight: 153 lb (69.4 kg)   Height: 5\' 6"  (1.676 m)     GENERAL: The patient is a well-nourished female, in no acute distress. The vital signs are documented above. CARDIAC: There is a regular rate and rhythm.  VASCULAR: Nonpalpable pedal pulses PULMONARY: Non-labored respirations MUSCULOSKELETAL: Amputation of right toes 2 through 5, and left above-knee amputation NEUROLOGIC: No focal weakness or paresthesias are detected. SKIN: Small 2 mm opening on the bottom of the right great toe PSYCHIATRIC: The patient has a normal affect.  STUDIES:   I have reviewed her vascular lab studies.  Her ABI is 0.2.  Duplex shows elevated velocities within the proximal and mid superficial femoral artery  MEDICAL ISSUES:   We are contemplating removal of the right great toe as this area has not healed.  Her ultrasound today shows elevated velocities within the right superficial femoral artery.  Before considering intervention on the right great toe (amputation), I think we need to repeat  her arteriogram to violate the mid superficial femoral artery and to intervene if needed.  I'll also get a good evaluation of the right foot to help determine whether or not she could heal an amputation of the right great toe.  Her procedure has been scheduled for Tuesday, July 24.  She will need to stop her Eliquis.  I'll plan on cannulation of the left groin and intervening on the right leg if needed.  Previously I used a 1.5 CSI classic device and a 4 mm drug coated balloon.  The tibioperoneal trunk was treated with primary balloon angioplasty and a 3 mm balloon    [name], MD [facility] [phone] Pager [phone]

## 2017-05-14 ENCOUNTER — Encounter: Payer: Self-pay | Admitting: Surgery

## 2017-05-14 DIAGNOSIS — I69122 Dysarthria following nontraumatic intracerebral hemorrhage: Secondary | ICD-10-CM | POA: Diagnosis not present

## 2017-05-14 DIAGNOSIS — Z7409 Other reduced mobility: Secondary | ICD-10-CM | POA: Diagnosis not present

## 2017-05-14 DIAGNOSIS — R293 Abnormal posture: Secondary | ICD-10-CM | POA: Diagnosis not present

## 2017-05-14 DIAGNOSIS — E119 Type 2 diabetes mellitus without complications: Secondary | ICD-10-CM | POA: Diagnosis not present

## 2017-05-14 DIAGNOSIS — R278 Other lack of coordination: Secondary | ICD-10-CM | POA: Diagnosis not present

## 2017-05-14 DIAGNOSIS — T8249XD Other complication of vascular dialysis catheter, subsequent encounter: Secondary | ICD-10-CM | POA: Diagnosis not present

## 2017-05-14 DIAGNOSIS — N2581 Secondary hyperparathyroidism of renal origin: Secondary | ICD-10-CM | POA: Diagnosis not present

## 2017-05-14 DIAGNOSIS — R1312 Dysphagia, oropharyngeal phase: Secondary | ICD-10-CM | POA: Diagnosis not present

## 2017-05-14 DIAGNOSIS — N186 End stage renal disease: Secondary | ICD-10-CM | POA: Diagnosis not present

## 2017-05-14 NOTE — Telephone Encounter (Signed)
Spoke with patient and she would like for you to continue as her PCP. I have scheduled her to come in in August for follow up appointment.

## 2017-05-14 NOTE — Telephone Encounter (Signed)
-----   Message from Briscoe Deutscher, DO sent at 05/13/2017  3:04 PM EDT ----- Is this patient still mine? EW ----- Message ----- From: Serafina Mitchell, MD Sent: 05/13/2017   3:02 PM To: Briscoe Deutscher, DO

## 2017-05-15 DIAGNOSIS — R278 Other lack of coordination: Secondary | ICD-10-CM | POA: Diagnosis not present

## 2017-05-15 DIAGNOSIS — R1312 Dysphagia, oropharyngeal phase: Secondary | ICD-10-CM | POA: Diagnosis not present

## 2017-05-15 DIAGNOSIS — N186 End stage renal disease: Secondary | ICD-10-CM | POA: Diagnosis not present

## 2017-05-15 DIAGNOSIS — R293 Abnormal posture: Secondary | ICD-10-CM | POA: Diagnosis not present

## 2017-05-15 DIAGNOSIS — Z7409 Other reduced mobility: Secondary | ICD-10-CM | POA: Diagnosis not present

## 2017-05-15 DIAGNOSIS — I69122 Dysarthria following nontraumatic intracerebral hemorrhage: Secondary | ICD-10-CM | POA: Diagnosis not present

## 2017-05-16 DIAGNOSIS — R293 Abnormal posture: Secondary | ICD-10-CM | POA: Diagnosis not present

## 2017-05-16 DIAGNOSIS — R1312 Dysphagia, oropharyngeal phase: Secondary | ICD-10-CM | POA: Diagnosis not present

## 2017-05-16 DIAGNOSIS — I69122 Dysarthria following nontraumatic intracerebral hemorrhage: Secondary | ICD-10-CM | POA: Diagnosis not present

## 2017-05-16 DIAGNOSIS — E119 Type 2 diabetes mellitus without complications: Secondary | ICD-10-CM | POA: Diagnosis not present

## 2017-05-16 DIAGNOSIS — T8249XD Other complication of vascular dialysis catheter, subsequent encounter: Secondary | ICD-10-CM | POA: Diagnosis not present

## 2017-05-16 DIAGNOSIS — Z7409 Other reduced mobility: Secondary | ICD-10-CM | POA: Diagnosis not present

## 2017-05-16 DIAGNOSIS — N2581 Secondary hyperparathyroidism of renal origin: Secondary | ICD-10-CM | POA: Diagnosis not present

## 2017-05-16 DIAGNOSIS — R278 Other lack of coordination: Secondary | ICD-10-CM | POA: Diagnosis not present

## 2017-05-16 DIAGNOSIS — N186 End stage renal disease: Secondary | ICD-10-CM | POA: Diagnosis not present

## 2017-05-17 ENCOUNTER — Ambulatory Visit (HOSPITAL_COMMUNITY)
Admission: RE | Admit: 2017-05-17 | Discharge: 2017-05-17 | Disposition: A | Discharge status: Home / Self Care | Payer: Medicare Other | Source: Ambulatory Visit | Attending: Internal Medicine | Admitting: Internal Medicine

## 2017-05-17 ENCOUNTER — Other Ambulatory Visit: Payer: Self-pay | Admitting: Internal Medicine

## 2017-05-17 ENCOUNTER — Telehealth: Payer: Self-pay

## 2017-05-17 ENCOUNTER — Encounter (HOSPITAL_BASED_OUTPATIENT_CLINIC_OR_DEPARTMENT_OTHER): Payer: Medicare Other | Attending: Internal Medicine

## 2017-05-17 DIAGNOSIS — Z7409 Other reduced mobility: Secondary | ICD-10-CM | POA: Diagnosis not present

## 2017-05-17 DIAGNOSIS — E11621 Type 2 diabetes mellitus with foot ulcer: Secondary | ICD-10-CM

## 2017-05-17 DIAGNOSIS — Z89421 Acquired absence of other right toe(s): Secondary | ICD-10-CM | POA: Diagnosis not present

## 2017-05-17 DIAGNOSIS — B9561 Methicillin susceptible Staphylococcus aureus infection as the cause of diseases classified elsewhere: Secondary | ICD-10-CM | POA: Diagnosis not present

## 2017-05-17 DIAGNOSIS — E1151 Type 2 diabetes mellitus with diabetic peripheral angiopathy without gangrene: Secondary | ICD-10-CM | POA: Diagnosis not present

## 2017-05-17 DIAGNOSIS — L97512 Non-pressure chronic ulcer of other part of right foot with fat layer exposed: Secondary | ICD-10-CM | POA: Diagnosis not present

## 2017-05-17 DIAGNOSIS — L97519 Non-pressure chronic ulcer of other part of right foot with unspecified severity: Principal | ICD-10-CM

## 2017-05-17 DIAGNOSIS — Z89612 Acquired absence of left leg above knee: Secondary | ICD-10-CM | POA: Diagnosis not present

## 2017-05-17 DIAGNOSIS — I120 Hypertensive chronic kidney disease with stage 5 chronic kidney disease or end stage renal disease: Secondary | ICD-10-CM | POA: Insufficient documentation

## 2017-05-17 DIAGNOSIS — Z992 Dependence on renal dialysis: Secondary | ICD-10-CM | POA: Insufficient documentation

## 2017-05-17 DIAGNOSIS — R293 Abnormal posture: Secondary | ICD-10-CM | POA: Diagnosis not present

## 2017-05-17 DIAGNOSIS — E1140 Type 2 diabetes mellitus with diabetic neuropathy, unspecified: Secondary | ICD-10-CM | POA: Insufficient documentation

## 2017-05-17 DIAGNOSIS — M8668 Other chronic osteomyelitis, other site: Secondary | ICD-10-CM | POA: Diagnosis not present

## 2017-05-17 DIAGNOSIS — N186 End stage renal disease: Secondary | ICD-10-CM | POA: Insufficient documentation

## 2017-05-17 DIAGNOSIS — R278 Other lack of coordination: Secondary | ICD-10-CM | POA: Diagnosis not present

## 2017-05-17 DIAGNOSIS — E1122 Type 2 diabetes mellitus with diabetic chronic kidney disease: Secondary | ICD-10-CM | POA: Diagnosis not present

## 2017-05-17 DIAGNOSIS — I69122 Dysarthria following nontraumatic intracerebral hemorrhage: Secondary | ICD-10-CM | POA: Diagnosis not present

## 2017-05-17 DIAGNOSIS — R1312 Dysphagia, oropharyngeal phase: Secondary | ICD-10-CM | POA: Diagnosis not present

## 2017-05-18 DIAGNOSIS — T8249XD Other complication of vascular dialysis catheter, subsequent encounter: Secondary | ICD-10-CM | POA: Diagnosis not present

## 2017-05-18 DIAGNOSIS — E119 Type 2 diabetes mellitus without complications: Secondary | ICD-10-CM | POA: Diagnosis not present

## 2017-05-18 DIAGNOSIS — N2581 Secondary hyperparathyroidism of renal origin: Secondary | ICD-10-CM | POA: Diagnosis not present

## 2017-05-18 DIAGNOSIS — N186 End stage renal disease: Secondary | ICD-10-CM | POA: Diagnosis not present

## 2017-05-20 DIAGNOSIS — R278 Other lack of coordination: Secondary | ICD-10-CM | POA: Diagnosis not present

## 2017-05-20 DIAGNOSIS — Z7409 Other reduced mobility: Secondary | ICD-10-CM | POA: Diagnosis not present

## 2017-05-20 DIAGNOSIS — R293 Abnormal posture: Secondary | ICD-10-CM | POA: Diagnosis not present

## 2017-05-20 DIAGNOSIS — R1312 Dysphagia, oropharyngeal phase: Secondary | ICD-10-CM | POA: Diagnosis not present

## 2017-05-20 DIAGNOSIS — I69122 Dysarthria following nontraumatic intracerebral hemorrhage: Secondary | ICD-10-CM | POA: Diagnosis not present

## 2017-05-20 DIAGNOSIS — N186 End stage renal disease: Secondary | ICD-10-CM | POA: Diagnosis not present

## 2017-05-21 DIAGNOSIS — N2581 Secondary hyperparathyroidism of renal origin: Secondary | ICD-10-CM | POA: Diagnosis not present

## 2017-05-21 DIAGNOSIS — T8249XD Other complication of vascular dialysis catheter, subsequent encounter: Secondary | ICD-10-CM | POA: Diagnosis not present

## 2017-05-21 DIAGNOSIS — N186 End stage renal disease: Secondary | ICD-10-CM | POA: Diagnosis not present

## 2017-05-21 DIAGNOSIS — E119 Type 2 diabetes mellitus without complications: Secondary | ICD-10-CM | POA: Diagnosis not present

## 2017-05-22 DIAGNOSIS — E785 Hyperlipidemia, unspecified: Secondary | ICD-10-CM | POA: Diagnosis not present

## 2017-05-22 DIAGNOSIS — I69122 Dysarthria following nontraumatic intracerebral hemorrhage: Secondary | ICD-10-CM | POA: Diagnosis not present

## 2017-05-22 DIAGNOSIS — I10 Essential (primary) hypertension: Secondary | ICD-10-CM | POA: Diagnosis not present

## 2017-05-22 DIAGNOSIS — Z7409 Other reduced mobility: Secondary | ICD-10-CM | POA: Diagnosis not present

## 2017-05-22 DIAGNOSIS — R278 Other lack of coordination: Secondary | ICD-10-CM | POA: Diagnosis not present

## 2017-05-22 DIAGNOSIS — R293 Abnormal posture: Secondary | ICD-10-CM | POA: Diagnosis not present

## 2017-05-22 DIAGNOSIS — I739 Peripheral vascular disease, unspecified: Secondary | ICD-10-CM | POA: Diagnosis not present

## 2017-05-22 DIAGNOSIS — R1312 Dysphagia, oropharyngeal phase: Secondary | ICD-10-CM | POA: Diagnosis not present

## 2017-05-22 DIAGNOSIS — G459 Transient cerebral ischemic attack, unspecified: Secondary | ICD-10-CM | POA: Diagnosis not present

## 2017-05-22 DIAGNOSIS — N186 End stage renal disease: Secondary | ICD-10-CM | POA: Diagnosis not present

## 2017-05-23 DIAGNOSIS — N186 End stage renal disease: Secondary | ICD-10-CM | POA: Diagnosis not present

## 2017-05-23 DIAGNOSIS — N2581 Secondary hyperparathyroidism of renal origin: Secondary | ICD-10-CM | POA: Diagnosis not present

## 2017-05-23 DIAGNOSIS — E119 Type 2 diabetes mellitus without complications: Secondary | ICD-10-CM | POA: Diagnosis not present

## 2017-05-23 DIAGNOSIS — T8249XD Other complication of vascular dialysis catheter, subsequent encounter: Secondary | ICD-10-CM | POA: Diagnosis not present

## 2017-05-24 ENCOUNTER — Other Ambulatory Visit (HOSPITAL_COMMUNITY)
Admission: RE | Admit: 2017-05-24 | Discharge: 2017-05-24 | Disposition: A | Discharge status: Home / Self Care | Payer: Medicare Other | Source: Other Acute Inpatient Hospital | Attending: Internal Medicine | Admitting: Internal Medicine

## 2017-05-24 DIAGNOSIS — E11621 Type 2 diabetes mellitus with foot ulcer: Secondary | ICD-10-CM | POA: Insufficient documentation

## 2017-05-24 DIAGNOSIS — R1312 Dysphagia, oropharyngeal phase: Secondary | ICD-10-CM | POA: Diagnosis not present

## 2017-05-24 DIAGNOSIS — L97512 Non-pressure chronic ulcer of other part of right foot with fat layer exposed: Secondary | ICD-10-CM | POA: Diagnosis not present

## 2017-05-24 DIAGNOSIS — Z7409 Other reduced mobility: Secondary | ICD-10-CM | POA: Diagnosis not present

## 2017-05-24 DIAGNOSIS — R278 Other lack of coordination: Secondary | ICD-10-CM | POA: Diagnosis not present

## 2017-05-24 DIAGNOSIS — N186 End stage renal disease: Secondary | ICD-10-CM | POA: Diagnosis not present

## 2017-05-24 DIAGNOSIS — I69122 Dysarthria following nontraumatic intracerebral hemorrhage: Secondary | ICD-10-CM | POA: Diagnosis not present

## 2017-05-24 DIAGNOSIS — R293 Abnormal posture: Secondary | ICD-10-CM | POA: Diagnosis not present

## 2017-05-25 DIAGNOSIS — N186 End stage renal disease: Secondary | ICD-10-CM | POA: Diagnosis not present

## 2017-05-25 DIAGNOSIS — T8249XD Other complication of vascular dialysis catheter, subsequent encounter: Secondary | ICD-10-CM | POA: Diagnosis not present

## 2017-05-25 DIAGNOSIS — N2581 Secondary hyperparathyroidism of renal origin: Secondary | ICD-10-CM | POA: Diagnosis not present

## 2017-05-25 DIAGNOSIS — E119 Type 2 diabetes mellitus without complications: Secondary | ICD-10-CM | POA: Diagnosis not present

## 2017-05-27 DIAGNOSIS — R278 Other lack of coordination: Secondary | ICD-10-CM | POA: Diagnosis not present

## 2017-05-27 DIAGNOSIS — N186 End stage renal disease: Secondary | ICD-10-CM | POA: Diagnosis not present

## 2017-05-27 DIAGNOSIS — I69122 Dysarthria following nontraumatic intracerebral hemorrhage: Secondary | ICD-10-CM | POA: Diagnosis not present

## 2017-05-27 DIAGNOSIS — R1312 Dysphagia, oropharyngeal phase: Secondary | ICD-10-CM | POA: Diagnosis not present

## 2017-05-27 DIAGNOSIS — Z7409 Other reduced mobility: Secondary | ICD-10-CM | POA: Diagnosis not present

## 2017-05-27 DIAGNOSIS — R293 Abnormal posture: Secondary | ICD-10-CM | POA: Diagnosis not present

## 2017-05-27 LAB — AEROBIC CULTURE W GRAM STAIN (SUPERFICIAL SPECIMEN)

## 2017-05-27 LAB — AEROBIC CULTURE  (SUPERFICIAL SPECIMEN)

## 2017-05-28 DIAGNOSIS — E119 Type 2 diabetes mellitus without complications: Secondary | ICD-10-CM | POA: Diagnosis not present

## 2017-05-28 DIAGNOSIS — N186 End stage renal disease: Secondary | ICD-10-CM | POA: Diagnosis not present

## 2017-05-28 DIAGNOSIS — R278 Other lack of coordination: Secondary | ICD-10-CM | POA: Diagnosis not present

## 2017-05-28 DIAGNOSIS — N2581 Secondary hyperparathyroidism of renal origin: Secondary | ICD-10-CM | POA: Diagnosis not present

## 2017-05-28 DIAGNOSIS — Z7409 Other reduced mobility: Secondary | ICD-10-CM | POA: Diagnosis not present

## 2017-05-28 DIAGNOSIS — T8249XD Other complication of vascular dialysis catheter, subsequent encounter: Secondary | ICD-10-CM | POA: Diagnosis not present

## 2017-05-28 DIAGNOSIS — R1312 Dysphagia, oropharyngeal phase: Secondary | ICD-10-CM | POA: Diagnosis not present

## 2017-05-28 DIAGNOSIS — R293 Abnormal posture: Secondary | ICD-10-CM | POA: Diagnosis not present

## 2017-05-28 DIAGNOSIS — I69122 Dysarthria following nontraumatic intracerebral hemorrhage: Secondary | ICD-10-CM | POA: Diagnosis not present

## 2017-05-29 DIAGNOSIS — R293 Abnormal posture: Secondary | ICD-10-CM | POA: Diagnosis not present

## 2017-05-29 DIAGNOSIS — Z7409 Other reduced mobility: Secondary | ICD-10-CM | POA: Diagnosis not present

## 2017-05-29 DIAGNOSIS — R278 Other lack of coordination: Secondary | ICD-10-CM | POA: Diagnosis not present

## 2017-05-29 DIAGNOSIS — N186 End stage renal disease: Secondary | ICD-10-CM | POA: Diagnosis not present

## 2017-05-29 DIAGNOSIS — I69122 Dysarthria following nontraumatic intracerebral hemorrhage: Secondary | ICD-10-CM | POA: Diagnosis not present

## 2017-05-29 DIAGNOSIS — R1312 Dysphagia, oropharyngeal phase: Secondary | ICD-10-CM | POA: Diagnosis not present

## 2017-05-30 DIAGNOSIS — T8249XD Other complication of vascular dialysis catheter, subsequent encounter: Secondary | ICD-10-CM | POA: Diagnosis not present

## 2017-05-30 DIAGNOSIS — L97512 Non-pressure chronic ulcer of other part of right foot with fat layer exposed: Secondary | ICD-10-CM | POA: Diagnosis not present

## 2017-05-30 DIAGNOSIS — N2581 Secondary hyperparathyroidism of renal origin: Secondary | ICD-10-CM | POA: Diagnosis not present

## 2017-05-30 DIAGNOSIS — E119 Type 2 diabetes mellitus without complications: Secondary | ICD-10-CM | POA: Diagnosis not present

## 2017-05-30 DIAGNOSIS — N186 End stage renal disease: Secondary | ICD-10-CM | POA: Diagnosis not present

## 2017-05-30 DIAGNOSIS — E11621 Type 2 diabetes mellitus with foot ulcer: Secondary | ICD-10-CM | POA: Diagnosis not present

## 2017-05-31 DIAGNOSIS — R1312 Dysphagia, oropharyngeal phase: Secondary | ICD-10-CM | POA: Diagnosis not present

## 2017-05-31 DIAGNOSIS — I69122 Dysarthria following nontraumatic intracerebral hemorrhage: Secondary | ICD-10-CM | POA: Diagnosis not present

## 2017-05-31 DIAGNOSIS — R278 Other lack of coordination: Secondary | ICD-10-CM | POA: Diagnosis not present

## 2017-05-31 DIAGNOSIS — N186 End stage renal disease: Secondary | ICD-10-CM | POA: Diagnosis not present

## 2017-05-31 DIAGNOSIS — R293 Abnormal posture: Secondary | ICD-10-CM | POA: Diagnosis not present

## 2017-05-31 DIAGNOSIS — Z7409 Other reduced mobility: Secondary | ICD-10-CM | POA: Diagnosis not present

## 2017-06-01 DIAGNOSIS — E119 Type 2 diabetes mellitus without complications: Secondary | ICD-10-CM | POA: Diagnosis not present

## 2017-06-01 DIAGNOSIS — N186 End stage renal disease: Secondary | ICD-10-CM | POA: Diagnosis not present

## 2017-06-01 DIAGNOSIS — T8249XD Other complication of vascular dialysis catheter, subsequent encounter: Secondary | ICD-10-CM | POA: Diagnosis not present

## 2017-06-01 DIAGNOSIS — N2581 Secondary hyperparathyroidism of renal origin: Secondary | ICD-10-CM | POA: Diagnosis not present

## 2017-06-03 ENCOUNTER — Other Ambulatory Visit (HOSPITAL_COMMUNITY): Payer: Self-pay | Admitting: Nephrology

## 2017-06-03 DIAGNOSIS — Z7409 Other reduced mobility: Secondary | ICD-10-CM | POA: Diagnosis not present

## 2017-06-03 DIAGNOSIS — N186 End stage renal disease: Secondary | ICD-10-CM | POA: Diagnosis not present

## 2017-06-03 DIAGNOSIS — R293 Abnormal posture: Secondary | ICD-10-CM | POA: Diagnosis not present

## 2017-06-03 DIAGNOSIS — R1312 Dysphagia, oropharyngeal phase: Secondary | ICD-10-CM | POA: Diagnosis not present

## 2017-06-03 DIAGNOSIS — R278 Other lack of coordination: Secondary | ICD-10-CM | POA: Diagnosis not present

## 2017-06-03 DIAGNOSIS — I69122 Dysarthria following nontraumatic intracerebral hemorrhage: Secondary | ICD-10-CM | POA: Diagnosis not present

## 2017-06-03 DIAGNOSIS — E119 Type 2 diabetes mellitus without complications: Secondary | ICD-10-CM | POA: Diagnosis not present

## 2017-06-04 ENCOUNTER — Ambulatory Visit (HOSPITAL_COMMUNITY): Admission: RE | Admit: 2017-06-04 | Payer: Medicare Other | Source: Ambulatory Visit | Admitting: Surgery

## 2017-06-04 ENCOUNTER — Encounter (HOSPITAL_COMMUNITY): Admission: RE | Payer: Self-pay | Source: Ambulatory Visit

## 2017-06-04 ENCOUNTER — Ambulatory Visit (HOSPITAL_COMMUNITY)
Admission: RE | Admit: 2017-06-04 | Discharge: 2017-06-04 | Disposition: A | Discharge status: Home / Self Care | Payer: Medicare Other | Source: Ambulatory Visit | Attending: Nephrology | Admitting: Nephrology

## 2017-06-04 SURGERY — ABDOMINAL AORTOGRAM W/LOWER EXTREMITY
Anesthesia: LOCAL

## 2017-06-05 ENCOUNTER — Other Ambulatory Visit: Payer: Self-pay

## 2017-06-05 ENCOUNTER — Inpatient Hospital Stay (HOSPITAL_COMMUNITY)
Admission: EM | Admit: 2017-06-05 | Discharge: 2017-06-07 | DRG: 919 | Disposition: A | Discharge status: Skilled Nursing Facility | Payer: Medicare Other | Attending: Internal Medicine | Admitting: Internal Medicine

## 2017-06-05 ENCOUNTER — Other Ambulatory Visit (HOSPITAL_COMMUNITY): Payer: Self-pay

## 2017-06-05 ENCOUNTER — Ambulatory Visit (HOSPITAL_COMMUNITY)
Admission: RE | Admit: 2017-06-05 | Discharge: 2017-06-05 | Disposition: A | Discharge status: Home / Self Care | Payer: Medicare Other | Source: Ambulatory Visit | Attending: Nephrology | Admitting: Nephrology

## 2017-06-05 ENCOUNTER — Encounter (HOSPITAL_COMMUNITY): Payer: Self-pay | Admitting: Interventional Radiology

## 2017-06-05 DIAGNOSIS — Z8673 Personal history of transient ischemic attack (TIA), and cerebral infarction without residual deficits: Secondary | ICD-10-CM | POA: Diagnosis not present

## 2017-06-05 DIAGNOSIS — T82838A Hemorrhage of vascular prosthetic devices, implants and grafts, initial encounter: Secondary | ICD-10-CM | POA: Diagnosis not present

## 2017-06-05 DIAGNOSIS — Z91040 Latex allergy status: Secondary | ICD-10-CM | POA: Diagnosis not present

## 2017-06-05 DIAGNOSIS — N2581 Secondary hyperparathyroidism of renal origin: Secondary | ICD-10-CM | POA: Diagnosis present

## 2017-06-05 DIAGNOSIS — Z452 Encounter for adjustment and management of vascular access device: Secondary | ICD-10-CM | POA: Diagnosis not present

## 2017-06-05 DIAGNOSIS — E875 Hyperkalemia: Secondary | ICD-10-CM | POA: Diagnosis not present

## 2017-06-05 DIAGNOSIS — T829XXA Unspecified complication of cardiac and vascular prosthetic device, implant and graft, initial encounter: Secondary | ICD-10-CM | POA: Diagnosis not present

## 2017-06-05 DIAGNOSIS — E1149 Type 2 diabetes mellitus with other diabetic neurological complication: Secondary | ICD-10-CM | POA: Diagnosis present

## 2017-06-05 DIAGNOSIS — Z9119 Patient's noncompliance with other medical treatment and regimen: Secondary | ICD-10-CM

## 2017-06-05 DIAGNOSIS — K219 Gastro-esophageal reflux disease without esophagitis: Secondary | ICD-10-CM | POA: Diagnosis present

## 2017-06-05 DIAGNOSIS — F339 Major depressive disorder, recurrent, unspecified: Secondary | ICD-10-CM | POA: Diagnosis present

## 2017-06-05 DIAGNOSIS — Z809 Family history of malignant neoplasm, unspecified: Secondary | ICD-10-CM

## 2017-06-05 DIAGNOSIS — Z7982 Long term (current) use of aspirin: Secondary | ICD-10-CM | POA: Diagnosis not present

## 2017-06-05 DIAGNOSIS — Z794 Long term (current) use of insulin: Secondary | ICD-10-CM

## 2017-06-05 DIAGNOSIS — I132 Hypertensive heart and chronic kidney disease with heart failure and with stage 5 chronic kidney disease, or end stage renal disease: Secondary | ICD-10-CM | POA: Diagnosis present

## 2017-06-05 DIAGNOSIS — Z992 Dependence on renal dialysis: Secondary | ICD-10-CM | POA: Diagnosis not present

## 2017-06-05 DIAGNOSIS — E8889 Other specified metabolic disorders: Secondary | ICD-10-CM | POA: Diagnosis present

## 2017-06-05 DIAGNOSIS — E785 Hyperlipidemia, unspecified: Secondary | ICD-10-CM | POA: Diagnosis not present

## 2017-06-05 DIAGNOSIS — T85838A Hemorrhage due to other internal prosthetic devices, implants and grafts, initial encounter: Secondary | ICD-10-CM | POA: Diagnosis not present

## 2017-06-05 DIAGNOSIS — Z86711 Personal history of pulmonary embolism: Secondary | ICD-10-CM

## 2017-06-05 DIAGNOSIS — Z89421 Acquired absence of other right toe(s): Secondary | ICD-10-CM

## 2017-06-05 DIAGNOSIS — Z87891 Personal history of nicotine dependence: Secondary | ICD-10-CM | POA: Diagnosis not present

## 2017-06-05 DIAGNOSIS — L97519 Non-pressure chronic ulcer of other part of right foot with unspecified severity: Secondary | ICD-10-CM | POA: Diagnosis present

## 2017-06-05 DIAGNOSIS — Z880 Allergy status to penicillin: Secondary | ICD-10-CM | POA: Diagnosis not present

## 2017-06-05 DIAGNOSIS — T8249XA Other complication of vascular dialysis catheter, initial encounter: Secondary | ICD-10-CM | POA: Diagnosis not present

## 2017-06-05 DIAGNOSIS — Z882 Allergy status to sulfonamides status: Secondary | ICD-10-CM | POA: Diagnosis not present

## 2017-06-05 DIAGNOSIS — G4730 Sleep apnea, unspecified: Secondary | ICD-10-CM | POA: Diagnosis present

## 2017-06-05 DIAGNOSIS — Z9889 Other specified postprocedural states: Secondary | ICD-10-CM

## 2017-06-05 DIAGNOSIS — E1151 Type 2 diabetes mellitus with diabetic peripheral angiopathy without gangrene: Secondary | ICD-10-CM | POA: Diagnosis present

## 2017-06-05 DIAGNOSIS — N186 End stage renal disease: Secondary | ICD-10-CM | POA: Diagnosis not present

## 2017-06-05 DIAGNOSIS — Z90710 Acquired absence of both cervix and uterus: Secondary | ICD-10-CM | POA: Diagnosis not present

## 2017-06-05 DIAGNOSIS — Z79899 Other long term (current) drug therapy: Secondary | ICD-10-CM

## 2017-06-05 DIAGNOSIS — Z66 Do not resuscitate: Secondary | ICD-10-CM | POA: Diagnosis present

## 2017-06-05 DIAGNOSIS — I480 Paroxysmal atrial fibrillation: Secondary | ICD-10-CM | POA: Diagnosis present

## 2017-06-05 DIAGNOSIS — F419 Anxiety disorder, unspecified: Secondary | ICD-10-CM | POA: Diagnosis present

## 2017-06-05 DIAGNOSIS — Z89612 Acquired absence of left leg above knee: Secondary | ICD-10-CM

## 2017-06-05 DIAGNOSIS — Z825 Family history of asthma and other chronic lower respiratory diseases: Secondary | ICD-10-CM

## 2017-06-05 DIAGNOSIS — E1122 Type 2 diabetes mellitus with diabetic chronic kidney disease: Secondary | ICD-10-CM | POA: Diagnosis present

## 2017-06-05 DIAGNOSIS — N179 Acute kidney failure, unspecified: Secondary | ICD-10-CM | POA: Diagnosis not present

## 2017-06-05 DIAGNOSIS — Z7901 Long term (current) use of anticoagulants: Secondary | ICD-10-CM

## 2017-06-05 DIAGNOSIS — S8990XA Unspecified injury of unspecified lower leg, initial encounter: Secondary | ICD-10-CM | POA: Diagnosis not present

## 2017-06-05 DIAGNOSIS — I509 Heart failure, unspecified: Secondary | ICD-10-CM | POA: Diagnosis present

## 2017-06-05 HISTORY — PX: IR FLUORO GUIDE CV LINE RIGHT: IMG2283

## 2017-06-05 LAB — I-STAT CHEM 8, ED
BUN: 70 mg/dL — AB (ref 6–20)
CHLORIDE: 101 mmol/L (ref 101–111)
CREATININE: 8.5 mg/dL — AB (ref 0.44–1.00)
Calcium, Ion: 1.15 mmol/L (ref 1.15–1.40)
GLUCOSE: 91 mg/dL (ref 65–99)
HCT: 38 % (ref 36.0–46.0)
Hemoglobin: 12.9 g/dL (ref 12.0–15.0)
POTASSIUM: 6.1 mmol/L — AB (ref 3.5–5.1)
Sodium: 133 mmol/L — ABNORMAL LOW (ref 135–145)
TCO2: 26 mmol/L (ref 0–100)

## 2017-06-05 LAB — COMPREHENSIVE METABOLIC PANEL
ALK PHOS: 122 U/L (ref 38–126)
ALT: 22 U/L (ref 14–54)
AST: 21 U/L (ref 15–41)
Albumin: 3.1 g/dL — ABNORMAL LOW (ref 3.5–5.0)
Anion gap: 14 (ref 5–15)
BUN: 78 mg/dL — ABNORMAL HIGH (ref 6–20)
CALCIUM: 9.2 mg/dL (ref 8.9–10.3)
CHLORIDE: 97 mmol/L — AB (ref 101–111)
CO2: 23 mmol/L (ref 22–32)
CREATININE: 8.12 mg/dL — AB (ref 0.44–1.00)
GFR, EST AFRICAN AMERICAN: 5 mL/min — AB (ref >60)
GFR, EST NON AFRICAN AMERICAN: 4 mL/min — AB (ref >60)
Glucose, Bld: 92 mg/dL (ref 65–99)
Potassium: 6.3 mmol/L (ref 3.5–5.1)
Sodium: 134 mmol/L — ABNORMAL LOW (ref 135–145)
Total Bilirubin: 0.4 mg/dL (ref 0.3–1.2)
Total Protein: 6.4 g/dL — ABNORMAL LOW (ref 6.5–8.1)

## 2017-06-05 LAB — CBC WITH DIFFERENTIAL/PLATELET
Basophils Absolute: 0 10*3/uL (ref 0.0–0.1)
Basophils Relative: 0 %
EOS PCT: 4 %
Eosinophils Absolute: 0.3 10*3/uL (ref 0.0–0.7)
HCT: 37.2 % (ref 36.0–46.0)
Hemoglobin: 12 g/dL (ref 12.0–15.0)
LYMPHS ABS: 2.1 10*3/uL (ref 0.7–4.0)
LYMPHS PCT: 28 %
MCH: 27.7 pg (ref 26.0–34.0)
MCHC: 32.3 g/dL (ref 30.0–36.0)
MCV: 85.9 fL (ref 78.0–100.0)
MONOS PCT: 9 %
Monocytes Absolute: 0.7 10*3/uL (ref 0.1–1.0)
Neutro Abs: 4.5 10*3/uL (ref 1.7–7.7)
Neutrophils Relative %: 59 %
PLATELETS: 151 10*3/uL (ref 150–400)
RBC: 4.33 MIL/uL (ref 3.87–5.11)
RDW: 15.5 % (ref 11.5–15.5)
WBC: 7.6 10*3/uL (ref 4.0–10.5)

## 2017-06-05 LAB — PROTIME-INR
INR: 1.17
PROTHROMBIN TIME: 15 s (ref 11.4–15.2)

## 2017-06-05 LAB — CBG MONITORING, ED: GLUCOSE-CAPILLARY: 64 mg/dL — AB (ref 65–99)

## 2017-06-05 LAB — APTT: APTT: 48 s — AB (ref 24–36)

## 2017-06-05 MED ORDER — SODIUM POLYSTYRENE SULFONATE 15 GM/60ML PO SUSP
50.0000 g | Freq: Once | ORAL | Status: AC
  Administered 2017-06-05: 50 g via ORAL
  Filled 2017-06-05: qty 240

## 2017-06-05 MED ORDER — ACETAMINOPHEN 650 MG RE SUPP: 650.0000 mg | Freq: Four times a day (QID) | RECTAL | Status: DC | PRN

## 2017-06-05 MED ORDER — PRO-STAT SUGAR FREE PO LIQD
30.0000 mL | Freq: Two times a day (BID) | ORAL | Status: DC
  Administered 2017-06-06 (×2): 30 mL via ORAL
  Filled 2017-06-05 (×4): qty 30

## 2017-06-05 MED ORDER — ACETAMINOPHEN 325 MG PO TABS
650.0000 mg | ORAL_TABLET | Freq: Four times a day (QID) | ORAL | Status: DC | PRN
  Administered 2017-06-06: 650 mg via ORAL
  Filled 2017-06-05 (×2): qty 2

## 2017-06-05 MED ORDER — HYDRALAZINE HCL 20 MG/ML IJ SOLN: 5.0000 mg | INTRAMUSCULAR | Status: DC | PRN

## 2017-06-05 MED ORDER — ONDANSETRON 4 MG PO TBDP: 4.0000 mg | ORAL_TABLET | Freq: Three times a day (TID) | ORAL | Status: DC | PRN

## 2017-06-05 MED ORDER — IOPAMIDOL (ISOVUE-300) INJECTION 61%
INTRAVENOUS | Status: AC
  Administered 2017-06-05: 15 mL
  Filled 2017-06-05: qty 50

## 2017-06-05 MED ORDER — CHLORHEXIDINE GLUCONATE 4 % EX LIQD
CUTANEOUS | Status: AC
  Filled 2017-06-05: qty 15

## 2017-06-05 MED ORDER — CALCIUM ACETATE (PHOS BINDER) 667 MG PO CAPS
1334.0000 mg | ORAL_CAPSULE | Freq: Three times a day (TID) | ORAL | Status: DC
  Administered 2017-06-06 – 2017-06-07 (×6): 1334 mg via ORAL
  Filled 2017-06-05 (×6): qty 2

## 2017-06-05 MED ORDER — LIDOCAINE HCL (PF) 1 % IJ SOLN
INTRAMUSCULAR | Status: AC
  Filled 2017-06-05: qty 30

## 2017-06-05 MED ORDER — CARVEDILOL 3.125 MG PO TABS
3.1250 mg | ORAL_TABLET | Freq: Two times a day (BID) | ORAL | Status: DC
  Administered 2017-06-06 – 2017-06-07 (×3): 3.125 mg via ORAL
  Filled 2017-06-05 (×3): qty 1

## 2017-06-05 MED ORDER — ATORVASTATIN CALCIUM 40 MG PO TABS
40.0000 mg | ORAL_TABLET | Freq: Every day | ORAL | Status: DC
  Administered 2017-06-06 – 2017-06-07 (×2): 40 mg via ORAL
  Filled 2017-06-05 (×2): qty 1

## 2017-06-05 MED ORDER — VANCOMYCIN HCL IN DEXTROSE 1-5 GM/200ML-% IV SOLN
1000.0000 mg | Freq: Once | INTRAVENOUS | Status: AC
  Administered 2017-06-05: 1000 mg via INTRAVENOUS

## 2017-06-05 MED ORDER — NEPRO/CARBSTEADY PO LIQD
237.0000 mL | ORAL | Status: DC
  Administered 2017-06-06 – 2017-06-07 (×2): 237 mL via ORAL

## 2017-06-05 MED ORDER — ASPIRIN EC 81 MG PO TBEC: 81.0000 mg | DELAYED_RELEASE_TABLET | ORAL | Status: DC

## 2017-06-05 MED ORDER — POLYETHYLENE GLYCOL 3350 17 G PO PACK
17.0000 g | PACK | Freq: Every day | ORAL | Status: DC
  Administered 2017-06-06: 17 g via ORAL
  Filled 2017-06-05: qty 1

## 2017-06-05 MED ORDER — CITALOPRAM HYDROBROMIDE 20 MG PO TABS
10.0000 mg | ORAL_TABLET | ORAL | Status: DC
  Administered 2017-06-06 – 2017-06-07 (×2): 10 mg via ORAL
  Filled 2017-06-05 (×2): qty 1

## 2017-06-05 MED ORDER — ZOLPIDEM TARTRATE 5 MG PO TABS
5.0000 mg | ORAL_TABLET | Freq: Every evening | ORAL | Status: DC | PRN
  Filled 2017-06-05: qty 1

## 2017-06-05 MED ORDER — RENA-VITE PO TABS
1.0000 | ORAL_TABLET | Freq: Every day | ORAL | Status: DC
  Administered 2017-06-06 – 2017-06-07 (×2): 1 via ORAL
  Filled 2017-06-05 (×2): qty 1

## 2017-06-05 MED ORDER — ONDANSETRON HCL 4 MG/2ML IJ SOLN: 4.0000 mg | Freq: Three times a day (TID) | INTRAMUSCULAR | Status: DC | PRN

## 2017-06-05 MED ORDER — PANTOPRAZOLE SODIUM 40 MG PO TBEC
40.0000 mg | DELAYED_RELEASE_TABLET | Freq: Every day | ORAL | Status: DC
  Administered 2017-06-06 – 2017-06-07 (×2): 40 mg via ORAL
  Filled 2017-06-05 (×2): qty 1

## 2017-06-05 MED ORDER — RISAQUAD PO CAPS
1.0000 | ORAL_CAPSULE | Freq: Three times a day (TID) | ORAL | Status: DC
  Administered 2017-06-06 – 2017-06-07 (×6): 1 via ORAL
  Filled 2017-06-05 (×8): qty 1

## 2017-06-05 MED ORDER — GABAPENTIN 100 MG PO CAPS
100.0000 mg | ORAL_CAPSULE | Freq: Every day | ORAL | Status: DC
  Administered 2017-06-06 (×2): 100 mg via ORAL
  Filled 2017-06-05 (×3): qty 1

## 2017-06-05 MED ORDER — VANCOMYCIN HCL IN DEXTROSE 1-5 GM/200ML-% IV SOLN
INTRAVENOUS | Status: AC
  Filled 2017-06-05: qty 200

## 2017-06-05 MED ORDER — HEPARIN SODIUM (PORCINE) 1000 UNIT/ML IJ SOLN
INTRAMUSCULAR | Status: AC
  Filled 2017-06-05: qty 1

## 2017-06-05 MED ORDER — CALCIUM POLYCARBOPHIL 625 MG PO TABS
625.0000 mg | ORAL_TABLET | Freq: Three times a day (TID) | ORAL | Status: DC | PRN
  Filled 2017-06-05: qty 1

## 2017-06-05 NOTE — ED Provider Notes (Signed)
Deborah Hendricks DEPT Provider Note   CSN: [id number] Arrival date & time: 06/05/17  1759     History   Chief Complaint Chief Complaint  Patient presents with  . bleeding AV catheter    HPI   Blood pressure (!) 193/91, pulse 70, temperature 97.7 F (36.5 C), temperature source Oral, resp. rate 16, height 5\' 6"  (1.676 m), weight 69.4 kg (153 lb), SpO2 100 %.  Deborah Hendricks is a 71 y.o. female with past mental history significant for ESRD, CHF, anemia and anxiety recently had a tunneled dialysis catheter to the left right femoral area changed today by Dr. Arita Miss. Patient was discharged from the hospital without complication however, when she returned back to the SNF and soaking through the dressing. Dressing was changed twice and he continues to bleed through. Patient states that she takes a look with she had not help this prior to procedure. Her last dose was yesterday morning. She denies any chest pain, palpitations, shortness of breath, dizziness or dyspnea on exertion. 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 She was last dialyzed on Saturday, she missed her last scheduled dialysis because she had "things to take care of." Patient does believe that she has been taking her eloquent without holding, she states that her last dose was day before yesterday and she just didn't take it this morning because she was nothing by mouth after midnight.   D/w SNf who states that patient eliquis was held on the 23rd, given on the 24th and held on the 25th.     Past Medical History:  Diagnosis Date  . Anemia   . Anxiety   . CHF (congestive heart failure) ([place])   . Constipation   . Critical lower limb ischemia   . Depression   . ESRD on dialysis [facility])    "TTS; [place]" (11/27/2016)  . GERD (gastroesophageal reflux disease)   . Heart murmur   . HLD (hyperlipidemia)   . Hypertension   . Memory loss   . Peripheral vascular disease ([place])   . Refusal of blood transfusions as  patient is Jehovah's Witness   . Seizures ([place]) 04/2015   s/p CVA.  [name] Sleep apnea    Noncompliant with CPAP.  [name] Stroke ([place]) 2015  . Type II diabetes mellitus [facility])     Patient Active Problem List   Diagnosis Date Noted  . Hyperkalemia 06/05/2017  . Bleeding due to dialysis catheter placement ([place]) 06/05/2017  . History of hysterectomy 03/09/2017  . Former smoker 03/09/2017  . Dysphagia 03/09/2017  . Carotid stenosis 03/09/2017  . Need for assistance due to reduced mobility 03/09/2017  . Complication of vascular access for dialysis 03/09/2017  . Anemia in chronic kidney disease (CKD) 03/09/2017  . Sleep apnea   . Refusal of blood transfusions as patient is Jehovah's Witness   . Constipation   . GERD (gastroesophageal reflux disease)   . HLD (hyperlipidemia)   . TIA (transient ischemic attack) 02/01/2017  . Diabetic ulcer of toe of right foot associated with diabetes mellitus due to underlying condition, limited to breakdown of skin ([place])   . PVD (peripheral vascular disease) ([place]) 11/27/2016  . Blurry vision   . Asymmetric SNHL (sensorineural hearing loss) 06/12/2016  . Peripheral neuropathy 06/11/2016  . History of pulmonary embolism 06/08/2016  . History of CVA with residual deficit 06/08/2016  . History of borderline personality disorder 04/19/2016  . History of medication noncompliance 04/19/2016  . Paroxysmal atrial fibrillation ([place]) 04/19/2016  .  Seizure disorder ([place]) 04/19/2016  . Renovascular hypertension 04/13/2016  . ESRD on dialysis ([place]) 04/11/2016  . Generalized anxiety disorder 04/11/2016  . Major depression, recurrent, chronic ([place]) 04/11/2016  . S/P AKA (above knee amputation) unilateral, left ([place]) 04/11/2016  . PAD (peripheral artery disease) ([place]) 12/15/2015  . Type II diabetes mellitus with neurological manifestations ([place]) 10/21/2015  . Seizures ([place]) 04/13/2015    Past Surgical History:  Procedure Laterality Date  . ABDOMINAL HYSTERECTOMY    .  AMPUTATION Left 12/15/2015   Procedure: LEFT ABOVE KNEE AMPUTATION;  Surgeon: Serafina Mitchell, MD;  Location: [place];  Service: Vascular;  Laterality: Left;  . BASCILIC VEIN TRANSPOSITION Right 11/25/2015   Procedure: Right Arm FIRST STAGE BASILIC VEIN TRANSPOSITION;  Surgeon: Serafina Mitchell, MD;  Location: [place];  Service: Vascular;  Laterality: Right;  . BASCILIC VEIN TRANSPOSITION Right 01/19/2016   Procedure: RIGHT SECOND STAGE BASILIC VEIN TRANSPOSITION;  Surgeon: Serafina Mitchell, MD;  Location: [place];  Service: Vascular;  Laterality: Right;  . DILATION AND CURETTAGE OF UTERUS    . EYE SURGERY    . IR FLUORO GUIDE CV LINE RIGHT  06/05/2017  . PERIPHERAL VASCULAR CATHETERIZATION Bilateral 11/28/2015   Procedure: Lower Extremity Angiography;  Surgeon: Lorretta Harp, MD;  Location: [place] CV LAB;  Service: Cardiovascular;  Laterality: Bilateral;  . PERIPHERAL VASCULAR CATHETERIZATION N/A 11/28/2015   Procedure: Abdominal Aortogram;  Surgeon: Lorretta Harp, MD;  Location: [place] CV LAB;  Service: Cardiovascular;  Laterality: N/A;  . PERIPHERAL VASCULAR CATHETERIZATION Right 11/27/2016  . PERIPHERAL VASCULAR CATHETERIZATION N/A 11/27/2016   Procedure: Abdominal Aortogram w/Lower Extremity;  Surgeon: Serafina Mitchell, MD;  Location: [place] CV LAB;  Service: Cardiovascular;  Laterality: N/A;  . PERIPHERAL VASCULAR CATHETERIZATION N/A 11/27/2016   Procedure: Right arm and central venogram;  Surgeon: Serafina Mitchell, MD;  Location: [place] CV LAB;  Service: Cardiovascular;  Laterality: N/A;  . PERIPHERAL VASCULAR CATHETERIZATION Right 11/27/2016   Procedure: Peripheral Vascular Atherectomy;  Surgeon: Serafina Mitchell, MD;  Location: [place] CV LAB;  Service: Cardiovascular;  Laterality: Right;  Rt    sfa, popliteal, tibioperoneal trunk, peroneal  . PERIPHERAL VASCULAR CATHETERIZATION Right 11/27/2016   Procedure: Peripheral Vascular Balloon Angioplasty;  Surgeon: Serafina Mitchell, MD;   Location: [place] CV LAB;  Service: Cardiovascular;  Laterality: Right;  Tibial peroneal  . RETINOPATHY SURGERY Bilateral   . TOE AMPUTATION Right    2nd, 3rd, 4th, 5th digits  . TONSILLECTOMY      OB History    No data available       Home Medications    Prior to Admission medications   Medication Sig Start Date End Date Taking? Authorizing Provider  Amino Acids-Protein Hydrolys (FEEDING SUPPLEMENT, PRO-STAT SUGAR FREE 64,) LIQD Take 30 mLs by mouth daily. Patient taking differently: Take 30 mLs by mouth 2 (two) times daily. (0900 & 2100) 02/01/17  Yes Asencion Partridge, MD  apixaban (ELIQUIS) 2.5 MG TABS tablet Take 1 tablet (2.5 mg total) by mouth 2 (two) times daily. 03/07/17  Yes Briscoe Deutscher, DO  aspirin EC 81 MG tablet Take 1 tablet (81 mg total) by mouth daily. Patient taking differently: Take 81 mg by mouth daily. (0900) 07/30/16  Yes Reyne Dumas, MD  atorvastatin (LIPITOR) 40 MG tablet Take 1 tablet (40 mg total) by mouth daily. Patient taking differently: Take 40 mg by mouth daily. (0900) 03/07/17  Yes Briscoe Deutscher, DO  calcium acetate (PHOSLO)  667 MG capsule Take 1,334 mg by mouth 3 (three) times daily with meals.    Yes [provider]  carvedilol (COREG) 3.125 MG tablet Take 1 tablet (3.125 mg total) by mouth 2 (two) times daily with a meal. Patient taking differently: Take 3.125 mg by mouth 3 (three) times daily.  07/30/16  Yes Reyne Dumas, MD  citalopram (CELEXA) 10 MG tablet Take 10 mg by mouth daily. (0900) 09/27/16  Yes [provider]  esomeprazole (NEXIUM) 20 MG capsule Take 1 capsule (20 mg total) by mouth daily. 03/11/17  Yes Briscoe Deutscher, DO  gabapentin (NEURONTIN) 100 MG capsule Take 1 capsule (100 mg total) by mouth at bedtime. 02/01/17  Yes Holley Raring, MD  insulin aspart (NOVOLOG) 100 UNIT/ML injection Inject 2-10 Units into the skin 3 (three) times daily as needed for high blood sugar. 151-200=2 units, 201-250=4 units, 251-300=6  units, 301-350=8 units, 351-400=10 units, >400=10 units and Call MD   Yes [provider]  levofloxacin (LEVAQUIN) 500 MG tablet Take 500 mg by mouth daily.   Yes [provider]  multivitamin (RENA-VIT) TABS tablet Take 1 tablet by mouth daily. (0900)   Yes [provider]  Nutritional Supplements (FEEDING SUPPLEMENT, NEPRO CARB STEADY,) LIQD Take 237 mLs by mouth daily. Patient taking differently: Take 237 mLs by mouth daily with lunch.  02/02/17  Yes Asencion Partridge, MD  ondansetron (ZOFRAN ODT) 4 MG disintegrating tablet Take 1 tablet (4 mg total) by mouth every 8 (eight) hours as needed for nausea or vomiting. 03/26/17  Yes Horton, Barbette Hair, MD  polyethylene glycol powder (GLYCOLAX/MIRALAX) powder Take 17 g by mouth daily. HOLD FOR LOOSE STOOLS   Yes [provider]  Probiotic Product (PROBIOTIC PO) Take 1 capsule by mouth 3 (three) times daily.   Yes [provider]  psyllium (REGULOID) 0.52 g capsule One po tid prn constipation. Patient taking differently: Take 0.52 g by mouth 3 (three) times daily as needed (for constipation.).  03/22/17  Yes Briscoe Deutscher, DO  fluconazole (DIFLUCAN) 50 MG tablet Take 1 tablet (50 mg total) by mouth daily. Patient not taking: Reported on 06/03/2017 03/22/17   Briscoe Deutscher, DO    Family History Family History  Problem Relation Age of Onset  . Asthma Mother   . Cancer Sister     Social History Social History  Substance Use Topics  . Smoking status: Former Smoker    Packs/day: 0.25    Years: 51.00    Types: Cigarettes    Quit date: 04/13/2015  . Smokeless tobacco: Never Used  . Alcohol use 0.0 oz/week     Comment: 11/27/2016 "stopped in 04/2015"     Allergies   Penicillins; Amoxicillin; Latex; and Sulfa antibiotics   Review of Systems Review of Systems  A complete review of systems was obtained and all systems are negative except as noted in the HPI and PMH.   Physical Exam Updated Vital  Signs BP (!) 168/76   Pulse 71   Temp 97.7 F (36.5 C) (Oral)   Resp 13   Ht 5\' 6"  (1.676 m)   Wt 69.4 kg (153 lb)   SpO2 99%   BMI 24.69 kg/m   Physical Exam  Constitutional: She is oriented to person, place, and time. She appears well-developed and well-nourished. No distress.  HENT:  Head: Normocephalic and atraumatic.  Mouth/Throat: Oropharynx is clear and moist.  Eyes: Pupils are equal, round, and reactive to light. Conjunctivae and EOM are normal.  Neck: Normal  range of motion.  Cardiovascular: Normal rate, regular rhythm and intact distal pulses.   Pulmonary/Chest: Effort normal and breath sounds normal.  Abdominal: Soft. There is no tenderness.  Musculoskeletal: Normal range of motion.  Left AKA  Neurological: She is alert and oriented to person, place, and time.  Skin: She is not diaphoretic.  Right femoral area with dressing soaked through with blood.  Venous oozing from catheter. Suture in place.   Psychiatric: She has a normal mood and affect.  Nursing note and vitals reviewed.      ED Treatments / Results  Labs (all labs ordered are listed, but only abnormal results are displayed) Labs Reviewed  COMPREHENSIVE METABOLIC PANEL - Abnormal; Notable for the following:       Result Value   Sodium 134 (*)    Potassium 6.3 (*)    Chloride 97 (*)    BUN 78 (*)    Creatinine, Ser 8.12 (*)    Total Protein 6.4 (*)    Albumin 3.1 (*)    GFR calc non Af Amer 4 (*)    GFR calc Af Amer 5 (*)    All other components within normal limits  APTT - Abnormal; Notable for the following:    aPTT 48 (*)    All other components within normal limits  I-STAT CHEM 8, ED - Abnormal; Notable for the following:    Sodium 133 (*)    Potassium 6.1 (*)    BUN 70 (*)    Creatinine, Ser 8.50 (*)    All other components within normal limits  CBC WITH DIFFERENTIAL/PLATELET  PROTIME-INR    EKG  EKG Interpretation None       Radiology Ir Fluoro Guide Cv Line  Right  Result Date: 06/05/2017 CLINICAL DATA:  End-stage renal disease with long-term indwelling tunneled right femoral hemodialysis catheter. Recent poor function of the catheter. EXAM: TUNNELED HEMODIALYSIS CATHETER EXCHANGE  FLUOROSCOPIC GUIDANCE TECHNIQUE: The procedure, risks, benefits, and alternatives were explained to the patient. Questions regarding the procedure were encouraged and answered. The patient understands and consents to the procedure. As antibiotic prophylaxis, vancomycin 1 g was ordered pre-procedure and administered intravenously within one hour of incision. Right femoral catheter and surrounding Region prepped using maximum barrier technique including cap and mask, sterile gown, sterile gloves, large sterile sheet, and Chlorhexidine as cutaneous antisepsis. The region was infiltrated locally with 1% lidocaine. Radiographic evaluation of the extant right femoral catheter shows no kink or other apparent mechanical complication. Venography through both lumens of the catheter was performed. No fibrin sheath were venous thrombosis identified. The catheter was dissected free of the underlying subcutaneous tissues and removed over an angled stiff glidewire. A new Palindrome 55 cm hemodialysis catheter was advanced into the mid IVC. Inferior vena cavography was performed. Mild tapered narrowing of the intrahepatic IVC with no evidence of any significant fibrin sheath. The catheter was then advanced to the inferior cavoatrial junction. Spot chest radiograph confirms good catheter position. No pneumothorax. Catheter was flushed and primed per protocol. Catheter secured externally with O Prolene sutures. The patient tolerated the procedure well. COMPLICATIONS: COMPLICATIONS None immediate FLUOROSCOPY TIME:  36 seconds, 22 mGy COMPARISON:  None IMPRESSION: 1. Technically successful exchange of tunneled right femoral hemodialysis catheter with fluoroscopic guidance. Ready for routine use. ACCESS:  Remains approachable for percutaneous intervention as needed. Electronically Signed   By: Lucrezia Europe M.D.   On: 06/05/2017 11:20    Procedures Procedures (including critical care time)  Medications Ordered in  ED Medications  polyethylene glycol powder (GLYCOLAX/MIRALAX) container 17 g (not administered)  Probiotic CAPS 1 capsule (not administered)  ondansetron (ZOFRAN-ODT) disintegrating tablet 4 mg (not administered)  psyllium (REGULOID) capsule 0.52 g (not administered)  atorvastatin (LIPITOR) tablet 40 mg (not administered)  pantoprazole (PROTONIX) EC tablet 40 mg (not administered)  feeding supplement (PRO-STAT SUGAR FREE 64) liquid 30 mL (not administered)  gabapentin (NEURONTIN) capsule 100 mg (not administered)  feeding supplement (NEPRO CARB STEADY) liquid 237 mL (not administered)  citalopram (CELEXA) tablet 10 mg (not administered)  aspirin EC tablet 81 mg (not administered)  calcium acetate (PHOSLO) capsule 1,334 mg (not administered)  carvedilol (COREG) tablet 3.125 mg (not administered)  multivitamin (RENA-VIT) tablet 1 tablet (not administered)  hydrALAZINE (APRESOLINE) injection 5 mg (not administered)  ondansetron (ZOFRAN) injection 4 mg (not administered)  zolpidem (AMBIEN) tablet 5 mg (not administered)  sodium polystyrene (KAYEXALATE) 15 GM/60ML suspension 50 g (50 g Oral Given 06/05/17 2251)     Initial Impression / Assessment and Plan / ED Course  I have reviewed the triage vital signs and the nursing notes.  Pertinent labs & imaging results that were available during my care of the patient were reviewed by me and considered in my medical decision making (see chart for details).     Vitals:   06/05/17 1915 06/05/17 1930 06/05/17 2130 06/05/17 2215  BP:  (!) 191/90 (!) 169/78 (!) 168/76  Pulse: 71 70 71 71  Resp: 19 18 18 13   Temp:      TempSrc:      SpO2: 100% 98% 100% 99%  Weight:      Height:        Medications  polyethylene glycol powder  (GLYCOLAX/MIRALAX) container 17 g (not administered)  Probiotic CAPS 1 capsule (not administered)  ondansetron (ZOFRAN-ODT) disintegrating tablet 4 mg (not administered)  psyllium (REGULOID) capsule 0.52 g (not administered)  atorvastatin (LIPITOR) tablet 40 mg (not administered)  pantoprazole (PROTONIX) EC tablet 40 mg (not administered)  feeding supplement (PRO-STAT SUGAR FREE 64) liquid 30 mL (not administered)  gabapentin (NEURONTIN) capsule 100 mg (not administered)  feeding supplement (NEPRO CARB STEADY) liquid 237 mL (not administered)  citalopram (CELEXA) tablet 10 mg (not administered)  aspirin EC tablet 81 mg (not administered)  calcium acetate (PHOSLO) capsule 1,334 mg (not administered)  carvedilol (COREG) tablet 3.125 mg (not administered)  multivitamin (RENA-VIT) tablet 1 tablet (not administered)  hydrALAZINE (APRESOLINE) injection 5 mg (not administered)  ondansetron (ZOFRAN) injection 4 mg (not administered)  zolpidem (AMBIEN) tablet 5 mg (not administered)  sodium polystyrene (KAYEXALATE) 15 GM/60ML suspension 50 g (50 g Oral Given 06/05/17 2251)    Deborah Hendricks is 71 y.o. female presenting with Oozing dialysis catheter placed today by interventional radiologist Dr. Vernard Gambles. Initially there was no complication however when patient returned back to SNF she noticed that the dressing was soaked through, they had to change the dressing twice the SNF and it was described that there was a puddle of blood around her. On my exam. There is some venous oozing. This patient is anticoagulated with eliquis  Discussed with Dr. Kathlene Cote of IR: He states that the surgical site will use. He stated there is no intervention other than what we have done in the ED other than to sit the patient fully upright in the bed.  Patient's potassium is 6.1, she missed her dialysis yesterday. EKG with no acute findings. Discussed with nephrologist Dr. Harolyn Rutherford who recommends Kayexalate 50 mg in  admission to  the hospital. Discussed with Dr. Blaine Hamper who accepts admission.    Final Clinical Impressions(s) / ED Diagnoses   Final diagnoses:  Hyperkalemia  Complication of vascular dialysis catheter, unspecified complication, initial encounter  Chronic anticoagulation    New Prescriptions New Prescriptions   No medications on file     Waynetta Pean 06/05/17 2314    Drenda Freeze, MD 06/06/17 [phone]

## 2017-06-05 NOTE — Procedures (Signed)
  Procedure:   R HD catheter exchange  Femoral 55cm Palindrome Preprocedure diagnosis:  ESRD Postprocedure diagnosis:  same EBL:     minimal Complications:   none immediate  See full dictation in [facility].  Dillard Cannon MD Main # [phone] Pager  [phone]

## 2017-06-05 NOTE — ED Notes (Signed)
Per [facility] staff, pt's eliquis was held the 23rd, given the 24th and held the 25th.

## 2017-06-05 NOTE — ED Notes (Signed)
PA aware of elevated bp.

## 2017-06-05 NOTE — H&P (Signed)
History and Physical    Deborah Hendricks HEN:[id number] DOB: 05/13/1946 DOA: 06/05/2017  Referring MD/NP/PA:   PCP: Briscoe Deutscher, DO   Patient coming from:  The patient is coming from SNF. At baseline, pt is independent for most of ADL.  Chief Complaint: Bleeding from dialysis catheter placement   HPI: Deborah Hendricks is a 71 y.o. female with medical history significant of hypertension, hyperlipidemia, diabetes mellitus, stroke, GERD, depression, anxiety, seizure, Jehovah's witness, PVD, ESRD-HD, dCHF, s/p of left AKA, anemia, PE and atrial fibrillation on Eliquis, who presents with bleeding from the dialysis catheter placement.  Pt had a tunneled dialysis catheter to the right femoral area changed today by Dr. Arita Miss. Patient was discharged from the hospital without complication. Whe she returned back to the SNF, she noted blood soaking through the dressing. Dressing was changed twice and he continues to bleed. Pt is on Eliuqis. Eliquis was held on the 23rd, given on the 24th and held on the 25th. Patient has mild pain over the surgical site. He denies chest pain, palpitation, shortness breath, dizziness, lightheadedness. No nausea, vomiting, diarrhea, abdominal pain, symptoms of UTI or unilateral weakness.  ED Course: pt was found to have hemoglobin 12.0, INR 1.17, PTT 48, WBC 7.6, potassium 6.3, bicarbonate 23, creatinine 8.12, temperature normal, no tachycardia, SECTION 99% on room air, blood pressure 210/87-->168/76. Pt is admitted to telemetry bed as inpatient.  Review of Systems:   General: no fevers, chills, no changes in body weight. HEENT: no blurry vision, hearing changes or sore throat Respiratory: no dyspnea, coughing, wheezing CV: no chest pain, no palpitations GI: no nausea, vomiting, abdominal pain, diarrhea, constipation GU: no dysuria, burning on urination, increased urinary frequency, hematuria  Ext: has mild leg edema Neuro: no unilateral weakness, numbness, or  tingling, no vision change or hearing loss Skin: no rash, no skin tear. MSK: No muscle spasm, no deformity, no limitation of range of movement in spin Heme: No easy bruising.  Travel history: No recent long distant travel.  Allergy:  Allergies  Allergen Reactions  . Penicillins Hives, Itching, Rash and Other (See Comments)    Has patient had a PCN reaction causing immediate rash, facial/tongue/throat swelling, SOB or lightheadedness with hypotension: Yes Has patient had a PCN reaction causing severe rash involving mucus membranes or skin necrosis:unknown MAR source Has patient had a PCN reaction that required hospitalization:unknown MAR source Has patient had a PCN reaction occurring within the last 10 years:unknown MAR source If all of the above answers are "NO", then may proceed with Cephalosporin use.  [name] Amoxicillin Hives, Itching and Rash    Has patient had a PCN reaction causing immediate rash, facial/tongue/throat swelling, SOB or lightheadedness with hypotension:unknown MAR source Has patient had a PCN reaction causing severe rash involving mucus membranes or skin necrosis:unknown MAR source Has patient had a PCN reaction that required hospitalization:unknown MAR source Has patient had a PCN reaction occurring within the last 10 years: unknown MAR source If all of the above answers are "NO", then may proceed with Cephalosporin use.   . Latex Rash  . Sulfa Antibiotics Other (See Comments)    Unknown     Past Medical History:  Diagnosis Date  . Anemia   . Anxiety   . CHF (congestive heart failure) ([place])   . Constipation   . Critical lower limb ischemia   . Depression   . ESRD on dialysis [facility])    "TTS; [place]" (11/27/2016)  . GERD (gastroesophageal reflux disease)   .  Heart murmur   . HLD (hyperlipidemia)   . Hypertension   . Memory loss   . Peripheral vascular disease ([place])   . Refusal of blood transfusions as patient is Jehovah's Witness   . Seizures ([place]) 04/2015    s/p CVA.  [name] Sleep apnea    Noncompliant with CPAP.  [name] Stroke ([place]) 2015  . Type II diabetes mellitus ([place])     Past Surgical History:  Procedure Laterality Date  . ABDOMINAL HYSTERECTOMY    . AMPUTATION Left 12/15/2015   Procedure: LEFT ABOVE KNEE AMPUTATION;  Surgeon: Serafina Mitchell, MD;  Location: [place];  Service: Vascular;  Laterality: Left;  . BASCILIC VEIN TRANSPOSITION Right 11/25/2015   Procedure: Right Arm FIRST STAGE BASILIC VEIN TRANSPOSITION;  Surgeon: Serafina Mitchell, MD;  Location: [place];  Service: Vascular;  Laterality: Right;  . BASCILIC VEIN TRANSPOSITION Right 01/19/2016   Procedure: RIGHT SECOND STAGE BASILIC VEIN TRANSPOSITION;  Surgeon: Serafina Mitchell, MD;  Location: [place];  Service: Vascular;  Laterality: Right;  . DILATION AND CURETTAGE OF UTERUS    . EYE SURGERY    . IR FLUORO GUIDE CV LINE RIGHT  06/05/2017  . PERIPHERAL VASCULAR CATHETERIZATION Bilateral 11/28/2015   Procedure: Lower Extremity Angiography;  Surgeon: Lorretta Harp, MD;  Location: [place] CV LAB;  Service: Cardiovascular;  Laterality: Bilateral;  . PERIPHERAL VASCULAR CATHETERIZATION N/A 11/28/2015   Procedure: Abdominal Aortogram;  Surgeon: Lorretta Harp, MD;  Location: [place] CV LAB;  Service: Cardiovascular;  Laterality: N/A;  . PERIPHERAL VASCULAR CATHETERIZATION Right 11/27/2016  . PERIPHERAL VASCULAR CATHETERIZATION N/A 11/27/2016   Procedure: Abdominal Aortogram w/Lower Extremity;  Surgeon: Serafina Mitchell, MD;  Location: [place] CV LAB;  Service: Cardiovascular;  Laterality: N/A;  . PERIPHERAL VASCULAR CATHETERIZATION N/A 11/27/2016   Procedure: Right arm and central venogram;  Surgeon: Serafina Mitchell, MD;  Location: [place] CV LAB;  Service: Cardiovascular;  Laterality: N/A;  . PERIPHERAL VASCULAR CATHETERIZATION Right 11/27/2016   Procedure: Peripheral Vascular Atherectomy;  Surgeon: Serafina Mitchell, MD;  Location: [place] CV LAB;  Service: Cardiovascular;   Laterality: Right;  Rt    sfa, popliteal, tibioperoneal trunk, peroneal  . PERIPHERAL VASCULAR CATHETERIZATION Right 11/27/2016   Procedure: Peripheral Vascular Balloon Angioplasty;  Surgeon: Serafina Mitchell, MD;  Location: [place] CV LAB;  Service: Cardiovascular;  Laterality: Right;  Tibial peroneal  . RETINOPATHY SURGERY Bilateral   . TOE AMPUTATION Right    2nd, 3rd, 4th, 5th digits  . TONSILLECTOMY      Social History:  reports that she quit smoking about 2 years ago. Her smoking use included Cigarettes. She has a 12.75 pack-year smoking history. She has never used smokeless tobacco. She reports that she drinks alcohol. She reports that she does not use drugs.  Family History:  Family History  Problem Relation Age of Onset  . Asthma Mother   . Cancer Sister      Prior to Admission medications   Medication Sig Start Date End Date Taking? Authorizing Provider  Amino Acids-Protein Hydrolys (FEEDING SUPPLEMENT, PRO-STAT SUGAR FREE 64,) LIQD Take 30 mLs by mouth daily. Patient taking differently: Take 30 mLs by mouth 2 (two) times daily. (0900 & 2100) 02/01/17   Asencion Partridge, MD  apixaban (ELIQUIS) 2.5 MG TABS tablet Take 1 tablet (2.5 mg total) by mouth 2 (two) times daily. 03/07/17   Briscoe Deutscher, DO  aspirin EC 81 MG tablet Take 1 tablet (81 mg total)  by mouth daily. Patient taking differently: Take 81 mg by mouth daily. (0900) 07/30/16   Reyne Dumas, MD  atorvastatin (LIPITOR) 40 MG tablet Take 1 tablet (40 mg total) by mouth daily. Patient taking differently: Take 40 mg by mouth daily. (0900) 03/07/17   Briscoe Deutscher, DO  calcium acetate (PHOSLO) 667 MG capsule Take 1,334 mg by mouth 3 (three) times daily with meals.     [provider]  carvedilol (COREG) 3.125 MG tablet Take 1 tablet (3.125 mg total) by mouth 2 (two) times daily with a meal. Patient taking differently: Take 3.125 mg by mouth 3 (three) times daily.  07/30/16   Reyne Dumas, MD  citalopram (CELEXA) 10  MG tablet Take 10 mg by mouth daily. (0900) 09/27/16   [provider]  esomeprazole (NEXIUM) 20 MG capsule Take 1 capsule (20 mg total) by mouth daily. 03/11/17   Briscoe Deutscher, DO  fluconazole (DIFLUCAN) 50 MG tablet Take 1 tablet (50 mg total) by mouth daily. Patient not taking: Reported on 06/03/2017 03/22/17   Briscoe Deutscher, DO  gabapentin (NEURONTIN) 100 MG capsule Take 1 capsule (100 mg total) by mouth at bedtime. 02/01/17   Holley Raring, MD  insulin aspart (NOVOLOG) 100 UNIT/ML injection Inject 2-10 Units into the skin 3 (three) times daily as needed for high blood sugar. 151-200=2 units, 201-250=4 units, 251-300=6 units, 301-350=8 units, 351-400=10 units, >400=10 units and Call MD    [provider]  levofloxacin (LEVAQUIN) 500 MG tablet Take 500 mg by mouth daily.    [provider]  multivitamin (RENA-VIT) TABS tablet Take 1 tablet by mouth daily. (0900)    [provider]  Nutritional Supplements (FEEDING SUPPLEMENT, NEPRO CARB STEADY,) LIQD Take 237 mLs by mouth daily. Patient taking differently: Take 237 mLs by mouth daily with lunch.  02/02/17   Asencion Partridge, MD  ondansetron (ZOFRAN ODT) 4 MG disintegrating tablet Take 1 tablet (4 mg total) by mouth every 8 (eight) hours as needed for nausea or vomiting. 03/26/17   Horton, Barbette Hair, MD  polyethylene glycol powder (GLYCOLAX/MIRALAX) powder Take 17 g by mouth daily. [place] STOOLS    [provider]  Probiotic Product (PROBIOTIC PO) Take 1 capsule by mouth 3 (three) times daily.    [provider]  psyllium (REGULOID) 0.52 g capsule One po tid prn constipation. Patient taking differently: Take 0.52 g by mouth 3 (three) times daily as needed (for constipation.).  03/22/17   Briscoe Deutscher, DO    Physical Exam: Vitals:   06/05/17 2215 06/05/17 2315 06/05/17 2330 06/06/17 0039  BP: (!) 168/76 (!) 170/88 (!) 163/80 136/68  Pulse: 71 72 72 70  Resp: 13 12 18 19   Temp:    98.2  F (36.8 C)  TempSrc:    Oral  SpO2: 99% 97% 97% 100%  Weight:    68.3 kg (150 lb 9.6 oz)  Height:    5\' 6"  (1.676 m)   General: Not in acute distress HEENT:       Eyes: PERRL, EOMI, no scleral icterus.       ENT: No discharge from the ears and nose, no pharynx injection, no tonsillar enlargement.        Neck: No JVD, no bruit, no mass felt. Heme: No neck lymph node enlargement. Cardiac: S1/S2, RRR, No murmurs, No gallops or rubs. Respiratory: No rales, wheezing, rhonchi or rubs. GI: Soft, nondistended, nontender, no rebound pain, no organomegaly, BS present. GU: No hematuria Ext: has trace  leg edema bilaterally. 1+DP/PT pulse on the right leg. S/p of left AKA Musculoskeletal: No joint deformities, No joint redness or warmth, no limitation of ROM in spin. Skin: s/p of HD catheter placement in right upper leg, suture in place, with venous oozing blood from catheter, dressing soaked through with blood. Neuro: Alert, oriented X3, cranial nerves II-XII grossly intact, moves all extremities normally. Psych: Patient is not psychotic, no suicidal or hemocidal ideation.  Labs on Admission: I have personally reviewed following labs and imaging studies  CBC:  Recent Labs Lab 06/05/17 1947 06/05/17 1959  WBC 7.6  --   NEUTROABS 4.5  --   HGB 12.0 12.9  HCT 37.2 38.0  MCV 85.9  --   PLT 151  --    Basic Metabolic Panel:  Recent Labs Lab 06/05/17 1947 06/05/17 1959  NA 134* 133*  K 6.3* 6.1*  CL 97* 101  CO2 23  --   GLUCOSE 92 91  BUN 78* 70*  CREATININE 8.12* 8.50*  CALCIUM 9.2  --    GFR: Estimated Creatinine Clearance: 5.6 mL/min (A) (by C-G formula based on SCr of 8.5 mg/dL (H)). Liver Function Tests:  Recent Labs Lab 06/05/17 1947  AST 21  ALT 22  ALKPHOS 122  BILITOT 0.4  PROT 6.4*  ALBUMIN 3.1*   No results for input(s): LIPASE, AMYLASE in the last 168 hours. No results for input(s): AMMONIA in the last 168 hours. Coagulation Profile:  Recent  Labs Lab 06/05/17 1947  INR 1.17   Cardiac Enzymes: No results for input(s): CKTOTAL, CKMB, CKMBINDEX, TROPONINI in the last 168 hours. BNP (last 3 results) No results for input(s): PROBNP in the last 8760 hours. HbA1C: No results for input(s): HGBA1C in the last 72 hours. CBG:  Recent Labs Lab 06/05/17 2359  GLUCAP 64*   Lipid Profile: No results for input(s): CHOL, HDL, LDLCALC, TRIG, CHOLHDL, LDLDIRECT in the last 72 hours. Thyroid Function Tests: No results for input(s): TSH, T4TOTAL, FREET4, T3FREE, THYROIDAB in the last 72 hours. Anemia Panel: No results for input(s): VITAMINB12, FOLATE, FERRITIN, TIBC, IRON, RETICCTPCT in the last 72 hours. Urine analysis:    Component Value Date/Time   COLORURINE YELLOW 01/28/2014 1129   APPEARANCEUR CLEAR 01/28/2014 1129   LABSPEC 1.012 01/28/2014 1129   PHURINE 6.0 01/28/2014 1129   GLUCOSEU NEGATIVE 01/28/2014 1129   HGBUR NEGATIVE 01/28/2014 1129   BILIRUBINUR NEGATIVE 01/28/2014 1129   KETONESUR NEGATIVE 01/28/2014 1129   PROTEINUR 100 (A) 01/28/2014 1129   UROBILINOGEN 0.2 01/28/2014 1129   NITRITE NEGATIVE 01/28/2014 1129   LEUKOCYTESUR TRACE (A) 01/28/2014 1129   Sepsis Labs: @LABRCNTIP (procalcitonin:4,lacticidven:4) )No results found for this or any previous visit (from the past 240 hour(s)).   Radiological Exams on Admission: Ir Fluoro Guide Cv Line Right  Result Date: 06/05/2017 CLINICAL DATA:  End-stage renal disease with long-term indwelling tunneled right femoral hemodialysis catheter. Recent poor function of the catheter. EXAM: TUNNELED HEMODIALYSIS CATHETER EXCHANGE  FLUOROSCOPIC GUIDANCE TECHNIQUE: The procedure, risks, benefits, and alternatives were explained to the patient. Questions regarding the procedure were encouraged and answered. The patient understands and consents to the procedure. As antibiotic prophylaxis, vancomycin 1 g was ordered pre-procedure and administered intravenously within one hour of  incision. Right femoral catheter and surrounding Region prepped using maximum barrier technique including cap and mask, sterile gown, sterile gloves, large sterile sheet, and Chlorhexidine as cutaneous antisepsis. The region was infiltrated locally with 1% lidocaine. Radiographic evaluation of the extant right femoral catheter shows  no kink or other apparent mechanical complication. Venography through both lumens of the catheter was performed. No fibrin sheath were venous thrombosis identified. The catheter was dissected free of the underlying subcutaneous tissues and removed over an angled stiff glidewire. A new Palindrome 55 cm hemodialysis catheter was advanced into the mid IVC. Inferior vena cavography was performed. Mild tapered narrowing of the intrahepatic IVC with no evidence of any significant fibrin sheath. The catheter was then advanced to the inferior cavoatrial junction. Spot chest radiograph confirms good catheter position. No pneumothorax. Catheter was flushed and primed per protocol. Catheter secured externally with O Prolene sutures. The patient tolerated the procedure well. COMPLICATIONS: COMPLICATIONS None immediate FLUOROSCOPY TIME:  36 seconds, 22 mGy COMPARISON:  None IMPRESSION: 1. Technically successful exchange of tunneled right femoral hemodialysis catheter with fluoroscopic guidance. Ready for routine use. ACCESS: Remains approachable for percutaneous intervention as needed. Electronically Signed   By: Lucrezia Europe M.D.   On: 06/05/2017 11:20     EKG: Independently reviewed.  Sinus rhythm, QTC 462, anteroseptal infarction pattern, low voltage, no T-wave peaking.   Assessment/Plan Principal Problem:   Bleeding due to dialysis catheter placement [facility]) Active Problems:   Type II diabetes mellitus with neurological manifestations ([place])   History of pulmonary embolism   ESRD on dialysis ([place])   Major depression, recurrent, chronic ([facility])   Paroxysmal atrial fibrillation ([facility])    GERD (gastroesophageal reflux disease)   HLD (hyperlipidemia)   Hyperkalemia   Bleeding due to dialysis catheter placement [facility]): EDP discussed with Dr. Kathlene Cote of IR. He states that there is no intervention needed now, but recommended to sit the patient fully upright in the bed. compression dressing was applied by EDP. Hgb 12.0.  -will admit to tele bed as inpt -repeat CBC in AM. -type and screen  ESRD on dialysis (TTS): missed HD on Tuesday. potassium 6.3, bicarbonate 23, creatinine 8.12. -renal, dr. Jimmy Footman was consulted-->will do HD in AM  DM-II: Last A1c 7.6 on 02/01/17, not well controled. Patient is taking NovoLog at home -SSI  History of pulmonary embolism: no CP or SOB -hold Eliquis now due to active bleeding  Atrial Fibrillation: CHA2DS2-VASc Score is 6, needs oral anticoagulation. Patient is on Eliquis at home. Heart rate is well controlled. -hold Eliquis now -continue coreg.   GERD: -Protonix  HLD: -lipitor  Depression and anxiety: Stable, no suicidal or homicidal ideations. -Continue home medications: Celexa  Hyperkalemia: Potassium 6.3. No EKG changes. -Kayexalate 50 g 1  DVT ppx: none (due to active bleeding from dialysis catheter placement in the right leg, patient cannot use heparin or Lovenox, also cannot use SCD. Patient is s/p of left AKA) Code Status: DNR (I discussed with patient, and explained the meaning of CODE STATUS. Patient wants to be DNR) Family Communication: None at bed side.   Disposition Plan:  Anticipate discharge back to previous SNF environment Consults called:  Renal, dr. Jimmy Footman; and IR, Dr. Kathlene Cote Admission status: Inpatient/tele      Date of Service 06/06/2017    Ivor Costa [facility] Pager [phone]  If 7PM-7AM, please contact night-coverage [email or web address] Password [email or web address] 06/06/2017, 1:56 AM

## 2017-06-05 NOTE — ED Triage Notes (Signed)
Pt here via EMS for complication after dialysis catheter was placed today.  Pt had returned home to [facility] and staff called ems stating graft had not stopped bleeding.  Large amount of blood noted on floor, beside pt, per ems.  Bleeding noted through dressing.  Pt ao x 4.

## 2017-06-06 ENCOUNTER — Encounter (HOSPITAL_COMMUNITY): Payer: Self-pay | Admitting: *Deleted

## 2017-06-06 DIAGNOSIS — Z7901 Long term (current) use of anticoagulants: Secondary | ICD-10-CM

## 2017-06-06 DIAGNOSIS — T829XXA Unspecified complication of cardiac and vascular prosthetic device, implant and graft, initial encounter: Secondary | ICD-10-CM

## 2017-06-06 LAB — GLUCOSE, CAPILLARY
GLUCOSE-CAPILLARY: 183 mg/dL — AB (ref 65–99)
GLUCOSE-CAPILLARY: 77 mg/dL (ref 65–99)
Glucose-Capillary: 200 mg/dL — ABNORMAL HIGH (ref 65–99)
Glucose-Capillary: 161 mg/dL — ABNORMAL HIGH (ref 65–99)
Glucose-Capillary: 122 mg/dL — ABNORMAL HIGH (ref 65–99)
Glucose-Capillary: 175 mg/dL — ABNORMAL HIGH (ref 65–99)

## 2017-06-06 LAB — BASIC METABOLIC PANEL
ANION GAP: 14 (ref 5–15)
BUN: 83 mg/dL — ABNORMAL HIGH (ref 6–20)
CALCIUM: 8.5 mg/dL — AB (ref 8.9–10.3)
CO2: 20 mmol/L — ABNORMAL LOW (ref 22–32)
CREATININE: 8.22 mg/dL — AB (ref 0.44–1.00)
Chloride: 100 mmol/L — ABNORMAL LOW (ref 101–111)
GFR, EST AFRICAN AMERICAN: 5 mL/min — AB (ref >60)
GFR, EST NON AFRICAN AMERICAN: 4 mL/min — AB (ref >60)
GLUCOSE: 198 mg/dL — AB (ref 65–99)
Potassium: 5.8 mmol/L — ABNORMAL HIGH (ref 3.5–5.1)
Sodium: 134 mmol/L — ABNORMAL LOW (ref 135–145)

## 2017-06-06 LAB — CBC
HCT: 32.1 % — ABNORMAL LOW (ref 36.0–46.0)
HEMOGLOBIN: 10.5 g/dL — AB (ref 12.0–15.0)
MCH: 27.7 pg (ref 26.0–34.0)
MCHC: 32.7 g/dL (ref 30.0–36.0)
MCV: 84.7 fL (ref 78.0–100.0)
PLATELETS: 128 10*3/uL — AB (ref 150–400)
RBC: 3.79 MIL/uL — AB (ref 3.87–5.11)
RDW: 15.6 % — ABNORMAL HIGH (ref 11.5–15.5)
WBC: 7.1 10*3/uL (ref 4.0–10.5)

## 2017-06-06 LAB — NO BLOOD PRODUCTS

## 2017-06-06 LAB — MRSA PCR SCREENING: MRSA BY PCR: NEGATIVE

## 2017-06-06 LAB — TYPE AND SCREEN
ABO/RH(D): A POS
ANTIBODY SCREEN: NEGATIVE

## 2017-06-06 LAB — ABO/RH: ABO/RH(D): A POS

## 2017-06-06 MED ORDER — INSULIN ASPART 100 UNIT/ML ~~LOC~~ SOLN: 0.0000 [IU] | Freq: Every day | SUBCUTANEOUS | Status: DC

## 2017-06-06 MED ORDER — SODIUM CHLORIDE 0.9 % IV SOLN: 100.0000 mL | INTRAVENOUS | Status: DC | PRN

## 2017-06-06 MED ORDER — DEXTROSE 50 % IV SOLN: 50.0000 mL | INTRAVENOUS | Status: DC | PRN

## 2017-06-06 MED ORDER — INSULIN ASPART 100 UNIT/ML ~~LOC~~ SOLN
0.0000 [IU] | Freq: Three times a day (TID) | SUBCUTANEOUS | Status: DC
  Administered 2017-06-06 (×2): 2 [IU] via SUBCUTANEOUS
  Administered 2017-06-06: 1 [IU] via SUBCUTANEOUS
  Administered 2017-06-07: 2 [IU] via SUBCUTANEOUS

## 2017-06-06 MED ORDER — DOXERCALCIFEROL 4 MCG/2ML IV SOLN
INTRAVENOUS | Status: AC
  Filled 2017-06-06: qty 2

## 2017-06-06 MED ORDER — SODIUM CHLORIDE 0.9% FLUSH
3.0000 mL | Freq: Two times a day (BID) | INTRAVENOUS | Status: DC
  Administered 2017-06-06 – 2017-06-07 (×4): 3 mL via INTRAVENOUS

## 2017-06-06 MED ORDER — SODIUM CHLORIDE 0.9% FLUSH: 3.0000 mL | INTRAVENOUS | Status: DC | PRN

## 2017-06-06 MED ORDER — ALTEPLASE 2 MG IJ SOLR: 2.0000 mg | Freq: Once | INTRAMUSCULAR | Status: DC | PRN

## 2017-06-06 MED ORDER — LIDOCAINE-PRILOCAINE 2.5-2.5 % EX CREA: 1.0000 "application " | TOPICAL_CREAM | CUTANEOUS | Status: DC | PRN

## 2017-06-06 MED ORDER — DOXERCALCIFEROL 4 MCG/2ML IV SOLN
1.0000 ug | INTRAVENOUS | Status: DC
  Administered 2017-06-06: 1 ug via INTRAVENOUS
  Filled 2017-06-06 (×2): qty 2

## 2017-06-06 MED ORDER — PENTAFLUOROPROP-TETRAFLUOROETH EX AERO: 1.0000 "application " | INHALATION_SPRAY | CUTANEOUS | Status: DC | PRN

## 2017-06-06 MED ORDER — LIDOCAINE HCL (PF) 1 % IJ SOLN: 5.0000 mL | INTRAMUSCULAR | Status: DC | PRN

## 2017-06-06 MED ORDER — HEPARIN SODIUM (PORCINE) 1000 UNIT/ML DIALYSIS: 1000.0000 [IU] | INTRAMUSCULAR | Status: DC | PRN

## 2017-06-06 MED ORDER — GELATIN ABSORBABLE 12-7 MM EX MISC
CUTANEOUS | Status: AC
  Filled 2017-06-06: qty 1

## 2017-06-06 MED ORDER — SODIUM CHLORIDE 0.9 % IV SOLN
250.0000 mL | INTRAVENOUS | Status: DC | PRN
Start: 2017-06-06 — End: 2017-06-07

## 2017-06-06 NOTE — Consult Note (Signed)
[facility] Renal Consultation Note    Indication for Consultation:  Management of ESRD/hemodialysis; anemia, hypertension/volume and secondary hyperparathyroidism  HPI: Deborah Hendricks is a 71 y.o. female ESRD secondary to DM on TTS HD at [facility] with a hx HTN, AF,  multiple CVAs, right toe amputations and left AKA, depression, Jehovah's Witness. Dialyzes via right femoral TDC. She was having poor blood flows via her tunneled dialysis cathter and was sent for catheter exchange yesterday per IR. She had continued bleeding after the procedure, soaking through dressings and was sent to the ED from [facility]. Patient is on Elliquis for Afib and per ED notes was held on the 23rd and 25th. K 6.3 on admission and was given dose of Kayexalate. Repeat K 5.8 Hbg 12.9>10.5.   Seen in room. Leg is tightly wrapped with compression bandage. She has no complaints currently other leg feels "itchy". Denies HA, dizziness, chills, chest pain, dyspnea, N,V. Last HD was 7/21. She missed Tuesday because she "had business to take care of". Usually compliant with HD  Past Medical History:  Diagnosis Date  . Anemia   . Anxiety   . CHF (congestive heart failure) ([place])   . Constipation   . Critical lower limb ischemia   . Depression   . ESRD on dialysis [facility])    "TTS; [place]" (11/27/2016)  . GERD (gastroesophageal reflux disease)   . Heart murmur   . HLD (hyperlipidemia)   . Hypertension   . Memory loss   . Peripheral vascular disease ([place])   . Refusal of blood transfusions as patient is Jehovah's Witness   . Seizures ([place]) 04/2015   s/p CVA.  [name] Sleep apnea    Noncompliant with CPAP.  [name] Stroke ([place]) 2015  . Type II diabetes mellitus ([place])    Past Surgical History:  Procedure Laterality Date  . ABDOMINAL HYSTERECTOMY    . AMPUTATION Left 12/15/2015   Procedure: LEFT ABOVE KNEE AMPUTATION;  Surgeon: Serafina Mitchell, MD;  Location: [place];  Service: Vascular;  Laterality: Left;  . BASCILIC  VEIN TRANSPOSITION Right 11/25/2015   Procedure: Right Arm FIRST STAGE BASILIC VEIN TRANSPOSITION;  Surgeon: Serafina Mitchell, MD;  Location: [place];  Service: Vascular;  Laterality: Right;  . BASCILIC VEIN TRANSPOSITION Right 01/19/2016   Procedure: RIGHT SECOND STAGE BASILIC VEIN TRANSPOSITION;  Surgeon: Serafina Mitchell, MD;  Location: [place];  Service: Vascular;  Laterality: Right;  . DILATION AND CURETTAGE OF UTERUS    . EYE SURGERY    . IR FLUORO GUIDE CV LINE RIGHT  06/05/2017  . PERIPHERAL VASCULAR CATHETERIZATION Bilateral 11/28/2015   Procedure: Lower Extremity Angiography;  Surgeon: Lorretta Harp, MD;  Location: [place] CV LAB;  Service: Cardiovascular;  Laterality: Bilateral;  . PERIPHERAL VASCULAR CATHETERIZATION N/A 11/28/2015   Procedure: Abdominal Aortogram;  Surgeon: Lorretta Harp, MD;  Location: [place] CV LAB;  Service: Cardiovascular;  Laterality: N/A;  . PERIPHERAL VASCULAR CATHETERIZATION Right 11/27/2016  . PERIPHERAL VASCULAR CATHETERIZATION N/A 11/27/2016   Procedure: Abdominal Aortogram w/Lower Extremity;  Surgeon: Serafina Mitchell, MD;  Location: [place] CV LAB;  Service: Cardiovascular;  Laterality: N/A;  . PERIPHERAL VASCULAR CATHETERIZATION N/A 11/27/2016   Procedure: Right arm and central venogram;  Surgeon: Serafina Mitchell, MD;  Location: [place] CV LAB;  Service: Cardiovascular;  Laterality: N/A;  . PERIPHERAL VASCULAR CATHETERIZATION Right 11/27/2016   Procedure: Peripheral Vascular Atherectomy;  Surgeon: Serafina Mitchell, MD;  Location: [place] CV  LAB;  Service: Cardiovascular;  Laterality: Right;  Rt    sfa, popliteal, tibioperoneal trunk, peroneal  . PERIPHERAL VASCULAR CATHETERIZATION Right 11/27/2016   Procedure: Peripheral Vascular Balloon Angioplasty;  Surgeon: Serafina Mitchell, MD;  Location: [place] CV LAB;  Service: Cardiovascular;  Laterality: Right;  Tibial peroneal  . RETINOPATHY SURGERY Bilateral   . TOE AMPUTATION Right    2nd, 3rd,  4th, 5th digits  . TONSILLECTOMY     Family History  Problem Relation Age of Onset  . Asthma Mother   . Cancer Sister    Social History:  reports that she quit smoking about 2 years ago. Her smoking use included Cigarettes. She has a 12.75 pack-year smoking history. She has never used smokeless tobacco. She reports that she drinks alcohol. She reports that she does not use drugs. Allergies  Allergen Reactions  . Penicillins Hives, Itching, Rash and Other (See Comments)    Has patient had a PCN reaction causing immediate rash, facial/tongue/throat swelling, SOB or lightheadedness with hypotension: Yes Has patient had a PCN reaction causing severe rash involving mucus membranes or skin necrosis:unknown MAR source Has patient had a PCN reaction that required hospitalization:unknown MAR source Has patient had a PCN reaction occurring within the last 10 years:unknown MAR source If all of the above answers are "NO", then may proceed with Cephalosporin use.  [name] Amoxicillin Hives, Itching and Rash    Has patient had a PCN reaction causing immediate rash, facial/tongue/throat swelling, SOB or lightheadedness with hypotension:unknown MAR source Has patient had a PCN reaction causing severe rash involving mucus membranes or skin necrosis:unknown MAR source Has patient had a PCN reaction that required hospitalization:unknown MAR source Has patient had a PCN reaction occurring within the last 10 years: unknown MAR source If all of the above answers are "NO", then may proceed with Cephalosporin use.   . Latex Rash  . Sulfa Antibiotics Other (See Comments)    Unknown    Prior to Admission medications   Medication Sig Start Date End Date Taking? Authorizing Provider  Amino Acids-Protein Hydrolys (FEEDING SUPPLEMENT, PRO-STAT SUGAR FREE 64,) LIQD Take 30 mLs by mouth daily. Patient taking differently: Take 30 mLs by mouth 2 (two) times daily. (0900 & 2100) 02/01/17  Yes Asencion Partridge, MD  apixaban  (ELIQUIS) 2.5 MG TABS tablet Take 1 tablet (2.5 mg total) by mouth 2 (two) times daily. 03/07/17  Yes Briscoe Deutscher, DO  aspirin EC 81 MG tablet Take 1 tablet (81 mg total) by mouth daily. Patient taking differently: Take 81 mg by mouth daily. (0900) 07/30/16  Yes Reyne Dumas, MD  atorvastatin (LIPITOR) 40 MG tablet Take 1 tablet (40 mg total) by mouth daily. Patient taking differently: Take 40 mg by mouth daily. (0900) 03/07/17  Yes Briscoe Deutscher, DO  calcium acetate (PHOSLO) 667 MG capsule Take 1,334 mg by mouth 3 (three) times daily with meals.    Yes [provider]  carvedilol (COREG) 3.125 MG tablet Take 1 tablet (3.125 mg total) by mouth 2 (two) times daily with a meal. Patient taking differently: Take 3.125 mg by mouth 3 (three) times daily.  07/30/16  Yes Reyne Dumas, MD  citalopram (CELEXA) 10 MG tablet Take 10 mg by mouth daily. (0900) 09/27/16  Yes [provider]  esomeprazole (NEXIUM) 20 MG capsule Take 1 capsule (20 mg total) by mouth daily. 03/11/17  Yes Briscoe Deutscher, DO  gabapentin (NEURONTIN) 100 MG capsule Take 1 capsule (100 mg total) by mouth at bedtime. 02/01/17  Yes Holley Raring, MD  insulin aspart (NOVOLOG) 100 UNIT/ML injection Inject 2-10 Units into the skin 3 (three) times daily as needed for high blood sugar. 151-200=2 units, 201-250=4 units, 251-300=6 units, 301-350=8 units, 351-400=10 units, >400=10 units and Call MD   Yes [provider]  levofloxacin (LEVAQUIN) 500 MG tablet Take 500 mg by mouth daily.   Yes [provider]  multivitamin (RENA-VIT) TABS tablet Take 1 tablet by mouth daily. (0900)   Yes [provider]  Nutritional Supplements (FEEDING SUPPLEMENT, NEPRO CARB STEADY,) LIQD Take 237 mLs by mouth daily. Patient taking differently: Take 237 mLs by mouth daily with lunch.  02/02/17  Yes Asencion Partridge, MD  ondansetron (ZOFRAN ODT) 4 MG disintegrating tablet Take 1 tablet (4 mg total) by mouth every 8 (eight)  hours as needed for nausea or vomiting. 03/26/17  Yes Horton, Barbette Hair, MD  polyethylene glycol powder (GLYCOLAX/MIRALAX) powder Take 17 g by mouth daily. HOLD FOR LOOSE STOOLS   Yes [provider]  Probiotic Product (PROBIOTIC PO) Take 1 capsule by mouth 3 (three) times daily.   Yes [provider]  psyllium (REGULOID) 0.52 g capsule One po tid prn constipation. Patient taking differently: Take 0.52 g by mouth 3 (three) times daily as needed (for constipation.).  03/22/17  Yes Briscoe Deutscher, DO  fluconazole (DIFLUCAN) 50 MG tablet Take 1 tablet (50 mg total) by mouth daily. Patient not taking: Reported on 06/03/2017 03/22/17   Briscoe Deutscher, DO   Current Facility-Administered Medications  Medication Dose Route Frequency Provider Last Rate Last Dose  . 0.9 %  sodium chloride infusion  250 mL Intravenous PRN Serafina Mitchell, MD      . acetaminophen (TYLENOL) tablet 650 mg  650 mg Oral Q6H PRN Ivor Costa, MD   650 mg at 06/06/17 0334   Or  . acetaminophen (TYLENOL) suppository 650 mg  650 mg Rectal Q6H PRN Ivor Costa, MD      . acidophilus (RISAQUAD) capsule 1 capsule  1 capsule Oral TID Ivor Costa, MD   1 capsule at 06/06/17 0334  . atorvastatin (LIPITOR) tablet 40 mg  40 mg Oral Daily Ivor Costa, MD      . calcium acetate (PHOSLO) capsule 1,334 mg  1,334 mg Oral TID WC Ivor Costa, MD      . carvedilol (COREG) tablet 3.125 mg  3.125 mg Oral BID WC Ivor Costa, MD   Stopped at 06/06/17 0800  . citalopram (CELEXA) tablet 10 mg  10 mg Oral Q24H Ivor Costa, MD      . dextrose 50 % solution 50 mL  50 mL Intravenous PRN Ivor Costa, MD      . feeding supplement (NEPRO CARB STEADY) liquid 237 mL  237 mL Oral Q24H Ivor Costa, MD      . feeding supplement (PRO-STAT SUGAR FREE 64) liquid 30 mL  30 mL Oral BID Ivor Costa, MD   30 mL at 06/06/17 0335  . gabapentin (NEURONTIN) capsule 100 mg  100 mg Oral QHS Ivor Costa, MD   100 mg at 06/06/17 0334  . hydrALAZINE (APRESOLINE) injection 5  mg  5 mg Intravenous Q2H PRN Ivor Costa, MD      . insulin aspart (novoLOG) injection 0-5 Units  0-5 Units Subcutaneous QHS Ivor Costa, MD      . insulin aspart (novoLOG) injection 0-9 Units  0-9 Units Subcutaneous TID WC Ivor Costa, MD      . multivitamin (RENA-VIT) tablet 1 tablet  1  tablet Oral Daily Ivor Costa, MD      . ondansetron [facility]) injection 4 mg  4 mg Intravenous Q8H PRN Ivor Costa, MD      . ondansetron (ZOFRAN-ODT) disintegrating tablet 4 mg  4 mg Oral Q8H PRN Ivor Costa, MD      . pantoprazole (PROTONIX) EC tablet 40 mg  40 mg Oral Daily Ivor Costa, MD      . polycarbophil (FIBERCON) tablet 625 mg  625 mg Oral TID PRN Ivor Costa, MD      . polyethylene glycol (MIRALAX / GLYCOLAX) packet 17 g  17 g Oral Daily Ivor Costa, MD      . sodium chloride flush (NS) 0.9 % injection 3 mL  3 mL Intravenous Q12H Serafina Mitchell, MD   3 mL at 06/06/17 0236  . sodium chloride flush (NS) 0.9 % injection 3 mL  3 mL Intravenous PRN Serafina Mitchell, MD      . zolpidem (AMBIEN) tablet 5 mg  5 mg Oral QHS PRN Ivor Costa, MD        ROS: As per HPI otherwise negative.  Physical Exam: Vitals:   06/05/17 2315 06/05/17 2330 06/06/17 0039 06/06/17 0647  BP: (!) 170/88 (!) 163/80 136/68 (!) 155/77  Pulse: 72 72 70 72  Resp: 12 18 19 16   Temp:   98.2 F (36.8 C) 97.6 F (36.4 C)  TempSrc:   Oral Oral  SpO2: 97% 97% 100% 98%  Weight:   68.3 kg (150 lb 9.6 oz)   Height:   5\' 6"  (1.676 m)      General: WDWN Elderly female NAD  Head: NCAT sclera not icteric MMM Neck: Supple. No JVD No masses Lungs: Breathing is unlabored. CTAB  Heart: RRR with S1 S2 Abdomen: soft NT + BS Lower extremities: Right thigh with compression wrap, some dried blood near top of bandage, chronic skin changes, hyperpigmention s/p L AKA  Neuro: A & O  X 3. Moves all extremities spontaneously. Psych:  Responds to questions appropriately with a normal affect. Dialysis Access: R fem [facility]   Labs: Basic Metabolic  Panel:  Recent Labs Lab 06/05/17 1947 06/05/17 1959 06/06/17 0436  NA 134* 133* 134*  K 6.3* 6.1* 5.8*  CL 97* 101 100*  CO2 23  --  20*  GLUCOSE 92 91 198*  BUN 78* 70* 83*  CREATININE 8.12* 8.50* 8.22*  CALCIUM 9.2  --  8.5*   Liver Function Tests:  Recent Labs Lab 06/05/17 1947  AST 21  ALT 22  ALKPHOS 122  BILITOT 0.4  PROT 6.4*  ALBUMIN 3.1*   No results for input(s): LIPASE, AMYLASE in the last 168 hours. No results for input(s): AMMONIA in the last 168 hours. CBC:  Recent Labs Lab 06/05/17 1947 06/05/17 1959 06/06/17 0436  WBC 7.6  --  7.1  NEUTROABS 4.5  --   --   HGB 12.0 12.9 10.5*  HCT 37.2 38.0 32.1*  MCV 85.9  --  84.7  PLT 151  --  128*   Cardiac Enzymes: No results for input(s): CKTOTAL, CKMB, CKMBINDEX, TROPONINI in the last 168 hours. CBG:  Recent Labs Lab 06/05/17 2359  GLUCAP 64*   Iron Studies: No results for input(s): IRON, TIBC, TRANSFERRIN, FERRITIN in the last 72 hours. Studies/Results: Ir Fluoro Guide Cv Line Right  Result Date: 06/05/2017 CLINICAL DATA:  End-stage renal disease with long-term indwelling tunneled right femoral hemodialysis catheter. Recent poor function of the catheter. EXAM: TUNNELED HEMODIALYSIS  CATHETER EXCHANGE  FLUOROSCOPIC GUIDANCE TECHNIQUE: The procedure, risks, benefits, and alternatives were explained to the patient. Questions regarding the procedure were encouraged and answered. The patient understands and consents to the procedure. As antibiotic prophylaxis, vancomycin 1 g was ordered pre-procedure and administered intravenously within one hour of incision. Right femoral catheter and surrounding Region prepped using maximum barrier technique including cap and mask, sterile gown, sterile gloves, large sterile sheet, and Chlorhexidine as cutaneous antisepsis. The region was infiltrated locally with 1% lidocaine. Radiographic evaluation of the extant right femoral catheter shows no kink or other apparent  mechanical complication. Venography through both lumens of the catheter was performed. No fibrin sheath were venous thrombosis identified. The catheter was dissected free of the underlying subcutaneous tissues and removed over an angled stiff glidewire. A new Palindrome 55 cm hemodialysis catheter was advanced into the mid IVC. Inferior vena cavography was performed. Mild tapered narrowing of the intrahepatic IVC with no evidence of any significant fibrin sheath. The catheter was then advanced to the inferior cavoatrial junction. Spot chest radiograph confirms good catheter position. No pneumothorax. Catheter was flushed and primed per protocol. Catheter secured externally with O Prolene sutures. The patient tolerated the procedure well. COMPLICATIONS: COMPLICATIONS None immediate FLUOROSCOPY TIME:  36 seconds, 22 mGy COMPARISON:  None IMPRESSION: 1. Technically successful exchange of tunneled right femoral hemodialysis catheter with fluoroscopic guidance. Ready for routine use. ACCESS: Remains approachable for percutaneous intervention as needed. Electronically Signed   By: Lucrezia Europe M.D.   On: 06/05/2017 11:20    Dialysis Orders:  [place] TTS 4h 180F BFR 400/800 2K/2Ca Prof 4 Na Linear EDW 67kg TDC Heparin bolus 5000 U Hectorol 1 mcg IV q HD   Assessment/Plan: 1. Bleeding after dialysis catheter placement - s/p IR catheter exchange 7/25 - per primary IR to evaluate today, holding Eliquis 2. Hyperkalemia - Kayexalate dosed in ED. Correct w HD today  3. ESRD -  TTS. Missed Tuesday. For HD today on schedule. No heparin  4. Hypertension/volume  -  BP ok/volume stable. Plan UF to EDW  5. Anemia  - Hgb 12.9 > 10.5 Follow trend, not on ESA currently  6. Metabolic bone disease -  Continue VDRA/Ca acetate binder  7. Nutrition - Renal diet/vitamins 8. DM - per primary  9. PAF - holding Eliquis d/t bleeding.     Lynnda Child PA-C [facility] Pager [phone] 06/06/2017, 9:11 AM    Pt seen, examined and agree w A/P as above. Will need coumadin, not Eliquis, for her afib.  Eliquis is not approved for ESRD. Have d/w primary MD.  Kelly Splinter MD [place] pager [phone]   06/06/2017, 3:24 PM

## 2017-06-06 NOTE — Progress Notes (Signed)
IR asked to eval (R)femoral Permcath for bleeding. Was just exchanged yesterday. Pt nursing facility was instructed to hold Eliquis for 2 days prior to procedure. Eliquis was held 7/23, but given 7/24, then held yesterday, day of procedure. The pt reports she thinks they gave her normal Eliquis dose upon return to the facility  Pt developed persistent bloody ooze and was sent to ED. She has been admitted and ooze persists. Eliquis has been held since admission last pm. Her last HD was several days ago as she skipped her Tuesday treatment.  (R)femoral Permcath intact. Large blood stained dressing removed. Some fresh clot at skin site. Clot removed. Slow ooze appears to be coming from skin edge site. Insertion site/track packed with Gel-foam and pressure held X 15 minutes. Bleeding appears to have ceased. Additional gel-foam applied and circumferential compression wrap/dressing applied.  OK to proceed with HD today, no heparin Ultimately, she may cont to have slow ooze until Eliquis is out of her system. May need to hold for a few more days before resuming.  IR following along.  Ascencion Dike PA-C Interventional Radiology 06/06/2017 10:37 AM

## 2017-06-06 NOTE — ED Notes (Addendum)
Pt's CBG 64, Dr. Blaine Hamper paged, waiting for response. Pt given graham crackers and peanut butter. Pt A&O x 4

## 2017-06-06 NOTE — Progress Notes (Signed)
Dressing to right great toe was changed, toe appears to be necrotic, rewrapped and MD made aware. Faint pulse found with doppler.

## 2017-06-06 NOTE — Progress Notes (Signed)
PROGRESS NOTE    Deborah Hendricks  DEY:[id number] DOB: 1946-11-01 DOA: 06/05/2017 PCP: Briscoe Deutscher, DO   Chief Complaint  Patient presents with  . bleeding AV catheter    Brief Narrative:  HPI On 06/05/2017 by Dr. Ivor Costa [name] is a 71 y.o. female with medical history significant of hypertension, hyperlipidemia, diabetes mellitus, stroke, GERD, depression, anxiety, seizure, Jehovah's witness, PVD, ESRD-HD, dCHF, s/p of left AKA, anemia, PE and atrial fibrillation on Eliquis, who presents with bleeding from the dialysis catheter placement.  Pt had a tunneled dialysis catheter to the right femoralarea changed today by Dr. Arita Miss. Patient was discharged from the hospital without complication. Whe she returned back to the SNF, she noted blood soaking through the dressing. Dressing was changed twice and he continues to bleed. Pt is on Eliuqis. Eliquis was held on the 23rd, given on the 24th and held on the 25th. Patient has mild pain over the surgical site. He denies chest pain, palpitation, shortness breath, dizziness, lightheadedness. No nausea, vomiting, diarrhea, abdominal pain, symptoms of UTI or unilateral weakness. Assessment & Plan   Bleeding secondary to dialysis catheter placement -Patient had femoral permacath exchange on 06/05/2017. Patient was instructed to hold Eliquis 2 days prior to procedure. She was given Eliquis on 7/24, however held on 06/03/2017. Not entirely sure if patient received it on 06/05/2017 after her procedure -Currently -Interventional radiology consulted and appreciated. Recommended holding Eliquis for a few more days, no heparin. -Hemoglobin currently stable -Continue to monitor CBC  End-stage renal disease -On hemodialysis, Tuesday Thursday Saturday -Nephrology consulted and appreciated -Patient missed her last hemodialysis treatment on Tuesday  Hyperkalemia  -Secondary to missed hemodialysis, currently improving suspect will improve with  dialysis today -Patient was given Kayexalate on admission -No EKG changes appreciated -Continue to monitor BMP  Diabetes mellitus, type II -Last hemoglobin A1c 7.6 on 02/01/2017 -Continue insulin sliding scale CBG monitoring  History of pulmonary embolism -Eliquis currently held  Atrial fibrillation -CHADSVASC 6 -Eliquis currently held -Continue Coreg  GERD -Continue PPI  Hyperlipidemia -Continue statin  Depression -Continue Celexa  DVT Prophylaxis  None (Cannot use SCD 2/2 HD catheter in RLE, LLE AKA)  Code Status: DO NOT RESUSCITATE   Family Communication: None bedside   Disposition Plan: Admitted. Will return to SNF when stale  Consultants Nephrology Interventional radiology  Procedures  None  Antibiotics   Anti-infectives    None      Subjective:   Deborah Hendricks seen and examined today.  Patient has no complaints today. Denies any chest pain, shortness of breath, abdominal pain, nausea or vomiting, dizziness or headache. Patient states she missed her Tuesday dialysis treatment because she had "business to take care of"   Objective:   Vitals:   06/05/17 2330 06/06/17 0039 06/06/17 0647 06/06/17 0911  BP: (!) 163/80 136/68 (!) 155/77 (!) 158/81  Pulse: 72 70 72 73  Resp: 18 19 16 16   Temp:  98.2 F (36.8 C) 97.6 F (36.4 C) 98.1 F (36.7 C)  TempSrc:  Oral Oral Oral  SpO2: 97% 100% 98% 98%  Weight:  68.3 kg (150 lb 9.6 oz)    Height:  5\' 6"  (1.676 m)      Intake/Output Summary (Last 24 hours) at 06/06/17 1317 Last data filed at 06/06/17 1200  Gross per 24 hour  Intake              123 ml  Output  0 ml  Net              123 ml   Filed Weights   06/05/17 1802 06/06/17 0039  Weight: 69.4 kg (153 lb) 68.3 kg (150 lb 9.6 oz)    Exam  General: Well developed, chronically ill-appearing, elderly, no apparent distress  HEENT: NCAT, mucous membranes moist.   Cardiovascular: S1 S2 auscultated, no rubs, murmurs or gallops.  Regular rate and rhythm.  Respiratory: Clear to auscultation bilaterally with equal chest rise  Abdomen: Soft, nontender, nondistended, + bowel sounds  Extremities: Left AKA, right thigh with compression wrap in place. Dressing on right great toe. Amputations of right second through fifth toes.   Neuro: AAOx3, nonfocal  Psych: appropriate mood and affect   Data Reviewed: I have personally reviewed following labs and imaging studies  CBC:  Recent Labs Lab 06/05/17 1947 06/05/17 1959 06/06/17 0436  WBC 7.6  --  7.1  NEUTROABS 4.5  --   --   HGB 12.0 12.9 10.5*  HCT 37.2 38.0 32.1*  MCV 85.9  --  84.7  PLT 151  --  093*   Basic Metabolic Panel:  Recent Labs Lab 06/05/17 1947 06/05/17 1959 06/06/17 0436  NA 134* 133* 134*  K 6.3* 6.1* 5.8*  CL 97* 101 100*  CO2 23  --  20*  GLUCOSE 92 91 198*  BUN 78* 70* 83*  CREATININE 8.12* 8.50* 8.22*  CALCIUM 9.2  --  8.5*   GFR: Estimated Creatinine Clearance: 5.8 mL/min (A) (by C-G formula based on SCr of 8.22 mg/dL (H)). Liver Function Tests:  Recent Labs Lab 06/05/17 1947  AST 21  ALT 22  ALKPHOS 122  BILITOT 0.4  PROT 6.4*  ALBUMIN 3.1*   No results for input(s): LIPASE, AMYLASE in the last 168 hours. No results for input(s): AMMONIA in the last 168 hours. Coagulation Profile:  Recent Labs Lab 06/05/17 1947  INR 1.17   Cardiac Enzymes: No results for input(s): CKTOTAL, CKMB, CKMBINDEX, TROPONINI in the last 168 hours. BNP (last 3 results) No results for input(s): PROBNP in the last 8760 hours. HbA1C: No results for input(s): HGBA1C in the last 72 hours. CBG:  Recent Labs Lab 06/05/17 2359 06/06/17 0056 06/06/17 0526 06/06/17 0819  GLUCAP 64* 77 200* 161*   Lipid Profile: No results for input(s): CHOL, HDL, LDLCALC, TRIG, CHOLHDL, LDLDIRECT in the last 72 hours. Thyroid Function Tests: No results for input(s): TSH, T4TOTAL, FREET4, T3FREE, THYROIDAB in the last 72 hours. Anemia Panel: No  results for input(s): VITAMINB12, FOLATE, FERRITIN, TIBC, IRON, RETICCTPCT in the last 72 hours. Urine analysis:    Component Value Date/Time   COLORURINE YELLOW 01/28/2014 1129   APPEARANCEUR CLEAR 01/28/2014 1129   LABSPEC 1.012 01/28/2014 1129   PHURINE 6.0 01/28/2014 1129   GLUCOSEU NEGATIVE 01/28/2014 1129   HGBUR NEGATIVE 01/28/2014 1129   BILIRUBINUR NEGATIVE 01/28/2014 1129   KETONESUR NEGATIVE 01/28/2014 1129   PROTEINUR 100 (A) 01/28/2014 1129   UROBILINOGEN 0.2 01/28/2014 1129   NITRITE NEGATIVE 01/28/2014 1129   LEUKOCYTESUR TRACE (A) 01/28/2014 1129   Sepsis Labs: @LABRCNTIP (procalcitonin:4,lacticidven:4)  ) Recent Results (from the past 240 hour(s))  MRSA PCR Screening     Status: None   Collection Time: 06/06/17 12:25 AM  Result Value Ref Range Status   MRSA by PCR NEGATIVE NEGATIVE Final    Comment:        The GeneXpert MRSA Assay (FDA approved for NASAL specimens only), is one component of  a comprehensive MRSA colonization surveillance program. It is not intended to diagnose MRSA infection nor to guide or monitor treatment for MRSA infections.       Radiology Studies: Ir Fluoro Guide Cv Line Right  Result Date: 06/05/2017 CLINICAL DATA:  End-stage renal disease with long-term indwelling tunneled right femoral hemodialysis catheter. Recent poor function of the catheter. EXAM: TUNNELED HEMODIALYSIS CATHETER EXCHANGE  FLUOROSCOPIC GUIDANCE TECHNIQUE: The procedure, risks, benefits, and alternatives were explained to the patient. Questions regarding the procedure were encouraged and answered. The patient understands and consents to the procedure. As antibiotic prophylaxis, vancomycin 1 g was ordered pre-procedure and administered intravenously within one hour of incision. Right femoral catheter and surrounding Region prepped using maximum barrier technique including cap and mask, sterile gown, sterile gloves, large sterile sheet, and Chlorhexidine as cutaneous  antisepsis. The region was infiltrated locally with 1% lidocaine. Radiographic evaluation of the extant right femoral catheter shows no kink or other apparent mechanical complication. Venography through both lumens of the catheter was performed. No fibrin sheath were venous thrombosis identified. The catheter was dissected free of the underlying subcutaneous tissues and removed over an angled stiff glidewire. A new Palindrome 55 cm hemodialysis catheter was advanced into the mid IVC. Inferior vena cavography was performed. Mild tapered narrowing of the intrahepatic IVC with no evidence of any significant fibrin sheath. The catheter was then advanced to the inferior cavoatrial junction. Spot chest radiograph confirms good catheter position. No pneumothorax. Catheter was flushed and primed per protocol. Catheter secured externally with O Prolene sutures. The patient tolerated the procedure well. COMPLICATIONS: COMPLICATIONS None immediate FLUOROSCOPY TIME:  36 seconds, 22 mGy COMPARISON:  None IMPRESSION: 1. Technically successful exchange of tunneled right femoral hemodialysis catheter with fluoroscopic guidance. Ready for routine use. ACCESS: Remains approachable for percutaneous intervention as needed. Electronically Signed   By: Lucrezia Europe M.D.   On: 06/05/2017 11:20     Scheduled Meds: . acidophilus  1 capsule Oral TID  . atorvastatin  40 mg Oral Daily  . calcium acetate  1,334 mg Oral TID WC  . carvedilol  3.125 mg Oral BID WC  . citalopram  10 mg Oral Q24H  . doxercalciferol  1 mcg Intravenous Q T,Th,Sa-HD  . feeding supplement (NEPRO CARB STEADY)  237 mL Oral Q24H  . feeding supplement (PRO-STAT SUGAR FREE 64)  30 mL Oral BID  . gabapentin  100 mg Oral QHS  . gelatin adsorbable      . insulin aspart  0-5 Units Subcutaneous QHS  . insulin aspart  0-9 Units Subcutaneous TID WC  . multivitamin  1 tablet Oral Daily  . pantoprazole  40 mg Oral Daily  . polyethylene glycol  17 g Oral Daily  .  sodium chloride flush  3 mL Intravenous Q12H   Continuous Infusions: . sodium chloride       LOS: 1 day   Time Spent in minutes   30 minutes  Iyanla Eilers D.O. on 06/06/2017 at 1:17 PM  Between 7am to 7pm - Pager - [phone]  After 7pm go to [email or web address] - password [email or web address]  And look for the night coverage person covering for me after hours  [facility] Office  [phone]

## 2017-06-07 LAB — CBC
HCT: 27.6 % — ABNORMAL LOW (ref 36.0–46.0)
Hemoglobin: 9 g/dL — ABNORMAL LOW (ref 12.0–15.0)
MCH: 28 pg (ref 26.0–34.0)
MCHC: 32.6 g/dL (ref 30.0–36.0)
MCV: 85.7 fL (ref 78.0–100.0)
PLATELETS: 117 10*3/uL — AB (ref 150–400)
RBC: 3.22 MIL/uL — ABNORMAL LOW (ref 3.87–5.11)
RDW: 15.8 % — AB (ref 11.5–15.5)
WBC: 6.1 10*3/uL (ref 4.0–10.5)

## 2017-06-07 LAB — BASIC METABOLIC PANEL
Anion gap: 11 (ref 5–15)
BUN: 34 mg/dL — AB (ref 6–20)
CALCIUM: 8 mg/dL — AB (ref 8.9–10.3)
CO2: 24 mmol/L (ref 22–32)
CREATININE: 4.45 mg/dL — AB (ref 0.44–1.00)
Chloride: 101 mmol/L (ref 101–111)
GFR calc Af Amer: 10 mL/min — ABNORMAL LOW (ref >60)
GFR, EST NON AFRICAN AMERICAN: 9 mL/min — AB (ref >60)
GLUCOSE: 148 mg/dL — AB (ref 65–99)
POTASSIUM: 4 mmol/L (ref 3.5–5.1)
SODIUM: 136 mmol/L (ref 135–145)

## 2017-06-07 LAB — GLUCOSE, CAPILLARY
GLUCOSE-CAPILLARY: 118 mg/dL — AB (ref 65–99)
GLUCOSE-CAPILLARY: 201 mg/dL — AB (ref 65–99)
Glucose-Capillary: 84 mg/dL (ref 65–99)

## 2017-06-07 NOTE — Progress Notes (Signed)
Called report to [place] at [facility].

## 2017-06-07 NOTE — Clinical Social Work Note (Signed)
Clinical Social Work Assessment  Patient Details  Name: Deborah Hendricks MRN: [id number] Date of Birth: 1945/12/05  Date of referral:  06/07/17               Reason for consult:  Facility Placement (Patient from [facility])                Permission sought to share information with:  Family Supports Permission granted to share information::  No (Patient reported that her daughter, Deborah Hendricks was already aware of her discharge)  Name::        Agency::     Relationship::     Contact Information:     Housing/Transportation Living arrangements for the past 2 months:  [place] ([place]) Source of Information:  Patient Patient Interpreter Needed:  None Criminal Activity/Legal Involvement Pertinent to Current Situation/Hospitalization:  No - Comment as needed Significant Relationships:  Adult Children Lives with:  Facility Resident ([place]) Do you feel safe going back to the place where you live?  Yes Need for family participation in patient care:  Yes (Comment)  Care giving concerns:  Patient did not express any concerns regarding her care at [facility].   Social Worker assessment / plan: CSW talked with patient at the bedside regarding her discharge and confirmed her intent to return to [facility]. Deborah Hendricks was alert, oriented and willing to talk with CSW regarding her discharge. When asked she did not want her daughter contacted and indicated that the doctor had talked with her.  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  Patient/Family's Understanding of and  Emotional Response to Diagnosis, Current Treatment, and Prognosis:  Not discussed.  Emotional Assessment Appearance:  Appears stated age Attitude/Demeanor/Rapport:  Other (Appropriate) Affect (typically observed):  Appropriate Orientation:  Oriented to Self, Oriented to Place, Oriented to  Time, Oriented to Situation Alcohol / Substance use:  Tobacco Use, Alcohol Use, Illicit Drugs (Patient reported that she quit smoking and does not drink or use illicit drugs) Psych involvement (Current and /or in the community):  No (Comment)  Discharge Needs  Concerns to be addressed:  No discharge needs identified Readmission within the last 30 days:  No Current discharge risk:  None Barriers to Discharge:  No Barriers Identified   Deborah Feil, LCSW 06/07/2017, 5:26 PM

## 2017-06-07 NOTE — NC FL2 (Signed)
[place] LEVEL OF CARE SCREENING TOOL     IDENTIFICATION  Patient Name: Deborah Hendricks Birthdate: September 15, 1946 Sex: female Admission Date (Current Location): 06/05/2017  [place] and [place] Number:  Deborah Hendricks [id number] [place] and Address:  [facility], [place] [address], [place], [place] [address]      Provider Number: [id number]  Attending Physician Name and Address:  Cristal Ford, DO  Relative Name and Phone Number:  Dossie Arbour - daughter.  [phone]    Current Level of Care: Hospital Recommended Level of Care: [place] (Patient from [facility]) Prior Approval Number:    Date Approved/Denied:   PASRR Number: [id number] (Eff. 09/21/15)  Discharge Plan: SNF    Current Diagnoses: Patient Active Problem List   Diagnosis Date Noted  . Hyperkalemia 06/05/2017  . Bleeding due to dialysis catheter placement ([place]) 06/05/2017  . History of hysterectomy 03/09/2017  . Former smoker 03/09/2017  . Dysphagia 03/09/2017  . Carotid stenosis 03/09/2017  . Need for assistance due to reduced mobility 03/09/2017  . Complication of vascular access for dialysis 03/09/2017  . Anemia in chronic kidney disease (CKD) 03/09/2017  . Sleep apnea   . Refusal of blood transfusions as patient is Jehovah's Witness   . Constipation   . GERD (gastroesophageal reflux disease)   . HLD (hyperlipidemia)   . TIA (transient ischemic attack) 02/01/2017  . Diabetic ulcer of toe of right foot associated with diabetes mellitus due to underlying condition, limited to breakdown of skin ([place])   . PVD (peripheral vascular disease) ([place]) 11/27/2016  . Blurry vision   . Asymmetric SNHL (sensorineural hearing loss) 06/12/2016  . Peripheral neuropathy 06/11/2016  . History of pulmonary embolism 06/08/2016  . History of CVA with residual deficit 06/08/2016  . History of borderline personality disorder 04/19/2016  . History of medication  noncompliance 04/19/2016  . Paroxysmal atrial fibrillation ([place]) 04/19/2016  . Seizure disorder ([place]) 04/19/2016  . Renovascular hypertension 04/13/2016  . ESRD on dialysis ([place]) 04/11/2016  . Generalized anxiety disorder 04/11/2016  . Major depression, recurrent, chronic ([place]) 04/11/2016  . S/P AKA (above knee amputation) unilateral, left ([place]) 04/11/2016  . PAD (peripheral artery disease) ([place]) 12/15/2015  . Type II diabetes mellitus with neurological manifestations ([place]) 10/21/2015  . Seizures ([place]) 04/13/2015    Orientation RESPIRATION BLADDER Height & Weight     Self, Time, Situation, Place  Normal Continent Weight: 152 lb (68.9 kg) Height:  5\' 6"  (167.6 cm)  BEHAVIORAL SYMPTOMS/MOOD NEUROLOGICAL BOWEL NUTRITION STATUS      Continent Diet (Renal/carb modified with fluid restriction)  AMBULATORY STATUS COMMUNICATION OF NEEDS Skin   Limited Assist Verbally Other (Comment) (Abrasion right knee. Diabetic ulcer right toe. Left AKA)                       Personal Care Assistance Level of Assistance  Bathing, Feeding, Dressing Bathing Assistance: Limited assistance Feeding assistance: Independent Dressing Assistance: Limited assistance     Functional Limitations Info  Sight, Hearing, Speech Sight Info: Adequate Hearing Info: Impaired Speech Info: Adequate    SPECIAL CARE FACTORS FREQUENCY                       Contractures Contractures Info: Not present    Additional Factors Info  Code Status, Allergies Code Status Info: DNR Allergies Info: Penicillins, Amoxicillin, Latex, Sulfa Antibiotics           Current Medications (06/07/2017):  This is the current hospital active medication list Current Facility-Administered Medications  Medication Dose Route Frequency Provider Last Rate Last Dose  . 0.9 %  sodium chloride infusion  250 mL Intravenous PRN Serafina Mitchell, MD      . acetaminophen (TYLENOL) tablet 650 mg  650 mg Oral Q6H PRN Ivor Costa, MD   650  mg at 06/06/17 5643   Or  . acetaminophen (TYLENOL) suppository 650 mg  650 mg Rectal Q6H PRN Ivor Costa, MD      . acidophilus (RISAQUAD) capsule 1 capsule  1 capsule Oral TID Ivor Costa, MD   1 capsule at 06/07/17 1010  . atorvastatin (LIPITOR) tablet 40 mg  40 mg Oral Daily Ivor Costa, MD   40 mg at 06/07/17 1010  . calcium acetate (PHOSLO) capsule 1,334 mg  1,334 mg Oral TID WC Ivor Costa, MD   1,334 mg at 06/07/17 1329  . carvedilol (COREG) tablet 3.125 mg  3.125 mg Oral BID WC Ivor Costa, MD   3.125 mg at 06/07/17 0834  . citalopram (CELEXA) tablet 10 mg  10 mg Oral Q24H Ivor Costa, MD   10 mg at 06/07/17 0834  . dextrose 50 % solution 50 mL  50 mL Intravenous PRN Ivor Costa, MD      . doxercalciferol (HECTOROL) injection 1 mcg  1 mcg Intravenous Q T,Th,Sa-HD Lynnda Child, PA-C   1 mcg at 06/06/17 1627  . feeding supplement (NEPRO CARB STEADY) liquid 237 mL  237 mL Oral Q24H Ivor Costa, MD   237 mL at 06/07/17 1011  . feeding supplement (PRO-STAT SUGAR FREE 64) liquid 30 mL  30 mL Oral BID Ivor Costa, MD   30 mL at 06/06/17 0917  . gabapentin (NEURONTIN) capsule 100 mg  100 mg Oral QHS Ivor Costa, MD   100 mg at 06/06/17 2125  . hydrALAZINE (APRESOLINE) injection 5 mg  5 mg Intravenous Q2H PRN Ivor Costa, MD      . insulin aspart (novoLOG) injection 0-5 Units  0-5 Units Subcutaneous QHS Ivor Costa, MD      . insulin aspart (novoLOG) injection 0-9 Units  0-9 Units Subcutaneous TID WC Ivor Costa, MD   1 Units at 06/06/17 1818  . multivitamin (RENA-VIT) tablet 1 tablet  1 tablet Oral Daily Ivor Costa, MD   1 tablet at 06/07/17 1010  . ondansetron (ZOFRAN) injection 4 mg  4 mg Intravenous Q8H PRN Ivor Costa, MD      . ondansetron (ZOFRAN-ODT) disintegrating tablet 4 mg  4 mg Oral Q8H PRN Ivor Costa, MD      . pantoprazole (PROTONIX) EC tablet 40 mg  40 mg Oral Daily Ivor Costa, MD   40 mg at 06/07/17 1010  . polycarbophil (FIBERCON) tablet 625 mg  625 mg Oral TID PRN Ivor Costa, MD       . polyethylene glycol (MIRALAX / GLYCOLAX) packet 17 g  17 g Oral Daily Ivor Costa, MD   17 g at 06/06/17 0917  . sodium chloride flush (NS) 0.9 % injection 3 mL  3 mL Intravenous Q12H Serafina Mitchell, MD   3 mL at 06/07/17 1011  . sodium chloride flush (NS) 0.9 % injection 3 mL  3 mL Intravenous PRN Serafina Mitchell, MD      . zolpidem (AMBIEN) tablet 5 mg  5 mg Oral QHS PRN Ivor Costa, MD         Discharge Medications: Please see discharge summary for a list of  discharge medications.  Relevant Imaging Results:  Relevant Lab Results:   Additional Information [phone]. Dialysis patient TTS at [facility].  Sable Feil, LCSW

## 2017-06-07 NOTE — Consult Note (Signed)
[facility] [place]  Patient name: Jenevie Casstevens MRN: [id number] DOB: 1946-05-14 Sex: female   REQUESTING PROVIDER:    Hospital service   REASON FOR CONSULT:    Right great toe ulcer  HISTORY OF PRESENT ILLNESS:   Anjolaoluwa Siguenza a 71 y.o.femalewho returns today for evaluation of a right leg ulcer. This has been present for many weeks. She is status post left above-knee amputation on 12/15/2015. She also suffers from end-stage renal disease. She has undergone a basilic vein procedure which has not developed. She has a catheter in her right thigh. He has a history of stroke. She is on anticoagulation. She suffers from diabetes with multiple complications.  On 11/27/2016 she underwent atherectomy and drug coated balloon angioplasty of the superficial femoral and popliteal artery on the right. She also had balloon angioplasty of the stenosis within the tibioperoneal trunk and proximal peroneal artery. She has single-vessel runoff via the peroneal artery.  The patient was scheduled to undergo angiography 3 days ago however, she did not show up.  She has elevated velocities within the proximal and mid superficial femoral arteries.  This had previously been treated with a 1.5 CSI classic device and a 4 mm drug coated balloon.  The tibioperoneal trunk was treated with primary balloon angioplasty using a 3 mm balloon.  PAST MEDICAL HISTORY    Past Medical History:  Diagnosis Date  . Anemia   . Anxiety   . CHF (congestive heart failure) ([place])   . Constipation   . Critical lower limb ischemia   . Depression   . ESRD on dialysis [facility])    "TTS; [place]" (11/27/2016)  . GERD (gastroesophageal reflux disease)   . Heart murmur   . HLD (hyperlipidemia)   . Hypertension   . Memory loss   . Peripheral vascular disease ([place])   . Refusal of blood transfusions as patient is Jehovah's Witness   . Seizures ([place]) 04/2015   s/p CVA.  [name] Sleep apnea    Noncompliant with CPAP.  [name] Stroke ([place]) 2015  . Type II diabetes mellitus ([place])      FAMILY HISTORY   Family History  Problem Relation Age of Onset  . Asthma Mother   . Cancer Sister     SOCIAL HISTORY:   Social History   Social History  . Marital status: Single    Spouse name: N/A  . Number of children: N/A  . Years of education: N/A   Occupational History  . Not on file.   Social History Main Topics  . Smoking status: Former Smoker    Packs/day: 0.25    Years: 51.00    Types: Cigarettes    Quit date: 04/13/2015  . Smokeless tobacco: Never Used  . Alcohol use 0.0 oz/week     Comment: 11/27/2016 "stopped in 04/2015"  . Drug use: No  . Sexual activity: No   Other Topics Concern  . Not on file   Social History Narrative  . No narrative on file    ALLERGIES:    Allergies  Allergen Reactions  . Penicillins Hives, Itching, Rash and Other (See Comments)    Has patient had a PCN reaction causing immediate rash, facial/tongue/throat swelling, SOB or lightheadedness with hypotension: Yes Has patient had a PCN reaction causing severe rash involving mucus membranes or skin necrosis:unknown MAR source Has patient had a PCN reaction that required hospitalization:unknown MAR source Has patient had a PCN reaction occurring within the last 10 years:unknown  MAR source If all of the above answers are "NO", then may proceed with Cephalosporin use.  [name] Amoxicillin Hives, Itching and Rash    Has patient had a PCN reaction causing immediate rash, facial/tongue/throat swelling, SOB or lightheadedness with hypotension:unknown MAR source Has patient had a PCN reaction causing severe rash involving mucus membranes or skin necrosis:unknown MAR source Has patient had a PCN reaction that required hospitalization:unknown MAR source Has patient had a PCN reaction occurring within the last 10 years: unknown MAR source If all of the above answers are "NO", then  may proceed with Cephalosporin use.   . Latex Rash  . Sulfa Antibiotics Other (See Comments)    Unknown     CURRENT MEDICATIONS:    Current Facility-Administered Medications  Medication Dose Route Frequency Provider Last Rate Last Dose  . 0.9 %  sodium chloride infusion  250 mL Intravenous PRN Serafina Mitchell, MD      . acetaminophen (TYLENOL) tablet 650 mg  650 mg Oral Q6H PRN Ivor Costa, MD   650 mg at 06/06/17 0334   Or  . acetaminophen (TYLENOL) suppository 650 mg  650 mg Rectal Q6H PRN Ivor Costa, MD      . acidophilus (RISAQUAD) capsule 1 capsule  1 capsule Oral TID Ivor Costa, MD   1 capsule at 06/06/17 2125  . atorvastatin (LIPITOR) tablet 40 mg  40 mg Oral Daily Ivor Costa, MD   40 mg at 06/06/17 0917  . calcium acetate (PHOSLO) capsule 1,334 mg  1,334 mg Oral TID WC Ivor Costa, MD   1,334 mg at 06/07/17 0803  . carvedilol (COREG) tablet 3.125 mg  3.125 mg Oral BID WC Ivor Costa, MD   3.125 mg at 06/07/17 0834  . citalopram (CELEXA) tablet 10 mg  10 mg Oral Q24H Ivor Costa, MD   10 mg at 06/07/17 0834  . dextrose 50 % solution 50 mL  50 mL Intravenous PRN Ivor Costa, MD      . doxercalciferol (HECTOROL) injection 1 mcg  1 mcg Intravenous Q T,Th,Sa-HD Lynnda Child, PA-C   1 mcg at 06/06/17 1627  . feeding supplement (NEPRO CARB STEADY) liquid 237 mL  237 mL Oral Q24H Ivor Costa, MD   237 mL at 06/06/17 0918  . feeding supplement (PRO-STAT SUGAR FREE 64) liquid 30 mL  30 mL Oral BID Ivor Costa, MD   30 mL at 06/06/17 0917  . gabapentin (NEURONTIN) capsule 100 mg  100 mg Oral QHS Ivor Costa, MD   100 mg at 06/06/17 2125  . hydrALAZINE (APRESOLINE) injection 5 mg  5 mg Intravenous Q2H PRN Ivor Costa, MD      . insulin aspart (novoLOG) injection 0-5 Units  0-5 Units Subcutaneous QHS Ivor Costa, MD      . insulin aspart (novoLOG) injection 0-9 Units  0-9 Units Subcutaneous TID WC Ivor Costa, MD   1 Units at 06/06/17 1818  . multivitamin (RENA-VIT) tablet 1 tablet  1 tablet  Oral Daily Ivor Costa, MD   1 tablet at 06/06/17 [phone]  . ondansetron (ZOFRAN) injection 4 mg  4 mg Intravenous Q8H PRN Ivor Costa, MD      . ondansetron (ZOFRAN-ODT) disintegrating tablet 4 mg  4 mg Oral Q8H PRN Ivor Costa, MD      . pantoprazole (PROTONIX) EC tablet 40 mg  40 mg Oral Daily Ivor Costa, MD   40 mg at 06/06/17 9323  . polycarbophil (FIBERCON) tablet 625 mg  625 mg Oral TID  PRN Ivor Costa, MD      . polyethylene glycol [facility] / GLYCOLAX) packet 17 g  17 g Oral Daily Ivor Costa, MD   17 g at 06/06/17 3086  . sodium chloride flush (NS) 0.9 % injection 3 mL  3 mL Intravenous Q12H Serafina Mitchell, MD   3 mL at 06/06/17 2128  . sodium chloride flush (NS) 0.9 % injection 3 mL  3 mL Intravenous PRN Serafina Mitchell, MD      . zolpidem (AMBIEN) tablet 5 mg  5 mg Oral QHS PRN Ivor Costa, MD        REVIEW OF SYSTEMS:   [X]  denotes positive finding, [ ]  denotes negative finding Cardiac  Comments:  Chest pain or chest pressure:    Shortness of breath upon exertion:    Short of breath when lying flat:    Irregular heart rhythm:        Vascular    Pain in calf, thigh, or hip brought on by ambulation:    Pain in feet at night that wakes you up from your sleep:     Blood clot in your veins:    Leg swelling:         Pulmonary    Oxygen at home:    Productive cough:     Wheezing:         Neurologic    Sudden weakness in arms or legs:     Sudden numbness in arms or legs:     Sudden onset of difficulty speaking or slurred speech:    Temporary loss of vision in one eye:     Problems with dizziness:         Gastrointestinal    Blood in stool:      Vomited blood:         Genitourinary    Burning when urinating:     Blood in urine:        Psychiatric    Major depression:         Hematologic    Bleeding problems: x   Problems with blood clotting too easily:        Skin    Rashes or ulcers: x       Constitutional    Fever or chills:     PHYSICAL EXAM:   Vitals:    06/06/17 1715 06/06/17 1753 06/06/17 2026 06/07/17 0423  BP: 111/70 (!) 128/43 (!) 136/59 (!) 92/49  Pulse: 82 86 90 88  Resp: 17 18 18 18   Temp: 98.2 F (36.8 C) 98.3 F (36.8 C) 98.4 F (36.9 C) 98.7 F (37.1 C)  TempSrc: Oral Oral Oral Oral  SpO2: 100% 100% 100% 100%  Weight: 148 lb 9.4 oz (67.4 kg)  152 lb (68.9 kg)   Height:        GENERAL: The patient is a well-nourished female, in no acute distress. The vital signs are documented above. CARDIAC: There is a regular rate and rhythm.  VASCULAR: Nonpalpable pedal pulses PULMONARY: Nonlabored respirations ABDOMEN: Soft and non-tender with normal pitched bowel sounds.  MUSCULOSKELETAL: Surgically absent left leg and toes right 1 through 4  NEUROLOGIC: No focal weakness or paresthesias are detected. SKIN: There are no ulcers or rashes noted. PSYCHIATRIC: The patient has a normal affect.  STUDIES:   None  ASSESSMENT and PLAN   Chronic ulcer to the right great toe: The patient was originally scheduled to undergo angiography to address stenosis within a previously treated segment.  I wanted to correct her stenosis prior to toe amputation so as to give this the best possible chance to heal.  She did not show up for her arteriogram.  She is now admitted because of bleeding from a dialysis catheter.  The wound remains stable without signs of systemic toxicity.  I discussed with the patient that I will plan on placing her back on the schedule for angiography this coming Wednesday.  She will need to again be off of her colic was.  If successful revascularization is performed, I can schedule her for a toe amputation the following day.  She can be discharged and have this done as an outpatient.   [name], MD [facility] [phone] Pager [phone]

## 2017-06-07 NOTE — Clinical Social Work Note (Signed)
Patient medically stable for discharge back to [facility] today per MD. Facility admissions director Freda Munro contacted and advised, and d/c clinicals transmitted to facility. Ms. Shoberg will be transported to facility by ambulance.  Azrael Maddix Givens, MSW, LCSW Licensed Clinical Social Worker [place] [phone]

## 2017-06-07 NOTE — Progress Notes (Signed)
Dr. Donnetta Hutching in to see  Patient, pt NPO until Dr. Trula Slade consults.

## 2017-06-07 NOTE — Discharge Summary (Signed)
Physician Discharge Summary  Deborah Hendricks NTZ:[id number] DOB: 1946/08/19 DOA: 06/05/2017  PCP: Briscoe Deutscher, DO  Admit date: 06/05/2017 Discharge date: 06/07/2017  Time spent: 45 minutes  Recommendations for Outpatient Follow-up:  Patient will be discharged to [facility].  Patient will need to follow up with primary care provider within one week of discharge.  Patient will need to follow up with Dr. Trula Slade, vascular surgery for angiography on Wednesday, 06/12/2017. DO NOT TAKE ELIQUIS. Patient should continue medications as prescribed.  Patient should follow a Renal/carb modified diet. Continue hemodialysis as scheduled.  Discharge Diagnoses:  Bleeding secondary to dialysis catheter placement Peripheral vascular disease End-stage renal disease Hyperkalemia  Diabetes mellitus, type II History of pulmonary embolism Paroxysmal Atrial fibrillation GERD Hyperlipidemia Depression History of CVA  Discharge Condition: Stable  Diet recommendation: Renal/carb modified  Filed Weights   06/06/17 1340 06/06/17 1715 06/06/17 2026  Weight: 68.4 kg (150 lb 12.7 oz) 67.4 kg (148 lb 9.4 oz) 68.9 kg (152 lb)    History of present illness:  On 06/05/2017 by Dr. Ivor Costa Deborah Hendricks a 71 y.o.femalewith medical history significant of hypertension, hyperlipidemia, diabetes mellitus, stroke, GERD, depression, anxiety, seizure, Jehovah's witness,PVD, ESRD-HD, dCHF, s/p of left AKA, anemia, PE and atrial fibrillation on Eliquis, who presents with bleeding from the dialysis catheter placement.  Pt had a tunneled dialysis catheter to the right femoralarea changed today by Dr. Arita Miss. Patient was discharged from the hospital without complication. Wheshe returned back to the SNF, she noted bloodsoaking through the dressing. Dressing was changed twice and he continues to bleed. Pt is on Eliuqis. Eliquis was held on the 23rd, given on the 24th and held on the 25th. Patient has mild pain  over the surgical site. He denies chest pain, palpitation, shortness breath, dizziness, lightheadedness. No nausea, vomiting, diarrhea, abdominal pain, symptoms of UTI or unilateral weakness.  Hospital Course:  Bleeding secondary to dialysis catheter placement -Patient had femoral permacath exchange on 06/05/2017. Patient was instructed to hold Eliquis 2 days prior to procedure. She was given Eliquis on 7/24, however held on 06/03/2017. Not entirely sure if patient received it on 06/05/2017 after her procedure -Currently -Interventional radiology consulted and appreciated. Recommended holding Eliquis for a few more days, no heparin. -Hemoglobin currently 9 -Repeat CBC in 1 week  Peripheral vascular disease -Vascular surgery consulted and appreciated as patient has necrotic changes to her right great toe. -Dr. Trula Slade has scheduled patient for angiography on this 06/12/2017 with possible amputation on 06/13/2017 if revascularization is performed. -Dr. Trula Slade feels that anticoagulation would need to be held on Sunday, 06/09/2017 until the procedure on 06/12/2017.  -Have discussed this with both patient as well as her daughter via phone. -Dr. Trula Slade feels that patient is low risk and does not need anticoagulation during this time. -patient currently in sinus rhythm  End-stage renal disease -On hemodialysis, Tuesday Thursday Saturday -Nephrology consulted and appreciated -Patient missed her last hemodialysis treatment on Tuesday  Hyperkalemia  -Resolved -Secondary to missed hemodialysis, currently improving suspect will improve with dialysis today -Patient was given Kayexalate on admission -No EKG changes appreciated  Diabetes mellitus, type II -Last hemoglobin A1c 7.6 on 02/01/2017 -Continue insulin sliding scale CBG monitoring  History of pulmonary embolism -Eliquis currently held  Paroxysmal Atrial fibrillation -CHADSVASC 6 -Currently in sinus rhythm -Eliquis currently  held -Continue Coreg -Dr. Jonnie Finner recommended patient be on Coumadin. Discussed this with patient and her daughter, patient has been on request for quite some time and has done  well. Would rather not change to Coumadin at this time. -Per FDA, Eliquis can be used in ESRD patients who are <65ys old, and weigh more than 65kg- which this patient meets criteria. Explained to patient's daughter and she would like to follow up with Dr. Marval Regal, patient's nephrologist before making changes to her anticoagulation.  -Discussed that patient will need to hold Eliquis until after her procedure on Wednesday and continue when instructed to.  -Per Interventional radiology, patient is to hold anticoagulation including heparin with dialysis for the next few days (written on 06/06/17).   GERD -Continue PPI  Hyperlipidemia -Continue statin  Depression -Continue Celexa  History of CVA -Currently aspirin and Eliquis on hold -Continue statin  Consultants Nephrology Interventional radiology Vascular surgery  Procedures  None  Discharge Exam: Vitals:   06/07/17 0423 06/07/17 0938  BP: (!) 92/49 (!) 87/46  Pulse: 88 79  Resp: 18 17  Temp: 98.7 F (37.1 C) 98.6 F (37 C)   Patient feeling better today. Currently denies any chest pain, shortness breath, abdominal pain, nausea vomiting, diarrhea or constipation, dizziness or headache. Would like to go back to nursing facility today.   General: Well developed, well nourished, NAD, appears stated age  HEENT: NCAT, mucous membranes moist.  Cardiovascular: S1 S2 auscultated, no rubs, murmurs or gallops. Regular rate and rhythm.  Respiratory: Clear to auscultation bilaterally with equal chest rise  Abdomen: Soft, nontender, nondistended, + bowel sounds  Extremities: Left AKA, right thigh with minimal drainage. Right great toe with necrotic changes.   Neuro: AAOx3, nonfocal  Psych: pleasant, appropriate mood and affect  Discharge  Instructions Discharge Instructions    Discharge instructions    Complete by:  As directed    Patient will be discharged to [facility].  Patient will need to follow up with primary care provider within one week of discharge.  Patient will need to follow up with Dr. Trula Slade, vascular surgery for angiography on Wednesday, 06/12/2017. DO NOT TAKE ELIQUIS. Patient should continue medications as prescribed.  Patient should follow a Renal/carb modified diet. Continue hemodialysis as scheduled.     Current Discharge Medication List    CONTINUE these medications which have NOT CHANGED   Details  Amino Acids-Protein Hydrolys (FEEDING SUPPLEMENT, PRO-STAT SUGAR FREE 64,) LIQD Take 30 mLs by mouth daily. Qty: 900 mL, Refills: 0    atorvastatin (LIPITOR) 40 MG tablet Take 1 tablet (40 mg total) by mouth daily. Qty: 30 tablet, Refills: 3    calcium acetate (PHOSLO) 667 MG capsule Take 1,334 mg by mouth 3 (three) times daily with meals.     carvedilol (COREG) 3.125 MG tablet Take 1 tablet (3.125 mg total) by mouth 2 (two) times daily with a meal. Qty: 60 tablet, Refills: 1    citalopram (CELEXA) 10 MG tablet Take 10 mg by mouth daily. (0900) Refills: 3    esomeprazole (NEXIUM) 20 MG capsule Take 1 capsule (20 mg total) by mouth daily. Qty: 90 capsule, Refills: 3    gabapentin (NEURONTIN) 100 MG capsule Take 1 capsule (100 mg total) by mouth at bedtime. Qty: 30 capsule, Refills: 0    insulin aspart (NOVOLOG) 100 UNIT/ML injection Inject 2-10 Units into the skin 3 (three) times daily as needed for high blood sugar. 151-200=2 units, 201-250=4 units, 251-300=6 units, 301-350=8 units, 351-400=10 units, >400=10 units and Call MD    multivitamin (RENA-VIT) TABS tablet Take 1 tablet by mouth daily. (0900)    Nutritional Supplements (FEEDING SUPPLEMENT, NEPRO CARB STEADY,) LIQD Take  237 mLs by mouth daily. Qty: 237 mL, Refills: 12    ondansetron (ZOFRAN ODT) 4 MG disintegrating tablet Take 1 tablet (4  mg total) by mouth every 8 (eight) hours as needed for nausea or vomiting. Qty: 20 tablet, Refills: 0    polyethylene glycol powder (GLYCOLAX/MIRALAX) powder Take 17 g by mouth daily. HOLD FOR LOOSE STOOLS    Probiotic Product (PROBIOTIC PO) Take 1 capsule by mouth 3 (three) times daily.    psyllium (REGULOID) 0.52 g capsule One po tid prn constipation. Qty: 90 capsule, Refills: 2   Associated Diagnoses: Constipation, unspecified constipation type    fluconazole (DIFLUCAN) 50 MG tablet Take 1 tablet (50 mg total) by mouth daily. Qty: 1 tablet, Refills: 0   Associated Diagnoses: Acute vaginitis      STOP taking these medications     apixaban (ELIQUIS) 2.5 MG TABS tablet      aspirin EC 81 MG tablet      levofloxacin (LEVAQUIN) 500 MG tablet        Allergies  Allergen Reactions  . Penicillins Hives, Itching, Rash and Other (See Comments)    Has patient had a PCN reaction causing immediate rash, facial/tongue/throat swelling, SOB or lightheadedness with hypotension: Yes Has patient had a PCN reaction causing severe rash involving mucus membranes or skin necrosis:unknown MAR source Has patient had a PCN reaction that required hospitalization:unknown MAR source Has patient had a PCN reaction occurring within the last 10 years:unknown MAR source If all of the above answers are "NO", then may proceed with Cephalosporin use.  [name] Amoxicillin Hives, Itching and Rash    Has patient had a PCN reaction causing immediate rash, facial/tongue/throat swelling, SOB or lightheadedness with hypotension:unknown MAR source Has patient had a PCN reaction causing severe rash involving mucus membranes or skin necrosis:unknown MAR source Has patient had a PCN reaction that required hospitalization:unknown MAR source Has patient had a PCN reaction occurring within the last 10 years: unknown MAR source If all of the above answers are "NO", then may proceed with Cephalosporin use.   . Latex Rash  . Sulfa  Antibiotics Other (See Comments)    Unknown    Follow-up Information    Briscoe Deutscher, DO Follow up.   Specialty:  Family Medicine Why:  Please contact office for a follow up appointment. Thank you.  Contact information: [place] [phone] [phone]        Serafina Mitchell, MD. Go on 06/12/2017.   Specialties:  Vascular Surgery, Cardiology Why:  Angiography Contact information: [address] [place] [address] [phone]            The results of significant diagnostics from this hospitalization (including imaging, microbiology, ancillary and laboratory) are listed below for reference.    Significant Diagnostic Studies: Ir Fluoro Guide Cv Line Right  Result Date: 06/05/2017 CLINICAL DATA:  End-stage renal disease with long-term indwelling tunneled right femoral hemodialysis catheter. Recent poor function of the catheter. EXAM: TUNNELED HEMODIALYSIS CATHETER EXCHANGE  FLUOROSCOPIC GUIDANCE TECHNIQUE: The procedure, risks, benefits, and alternatives were explained to the patient. Questions regarding the procedure were encouraged and answered. The patient understands and consents to the procedure. As antibiotic prophylaxis, vancomycin 1 g was ordered pre-procedure and administered intravenously within one hour of incision. Right femoral catheter and surrounding Region prepped using maximum barrier technique including cap and mask, sterile gown, sterile gloves, large sterile sheet, and Chlorhexidine as cutaneous antisepsis. The region was infiltrated locally with 1% lidocaine. Radiographic  evaluation of the extant right femoral catheter shows no kink or other apparent mechanical complication. Venography through both lumens of the catheter was performed. No fibrin sheath were venous thrombosis identified. The catheter was dissected free of the underlying subcutaneous tissues and removed over an angled stiff glidewire. A new Palindrome 55 cm hemodialysis  catheter was advanced into the mid IVC. Inferior vena cavography was performed. Mild tapered narrowing of the intrahepatic IVC with no evidence of any significant fibrin sheath. The catheter was then advanced to the inferior cavoatrial junction. Spot chest radiograph confirms good catheter position. No pneumothorax. Catheter was flushed and primed per protocol. Catheter secured externally with O Prolene sutures. The patient tolerated the procedure well. COMPLICATIONS: COMPLICATIONS None immediate FLUOROSCOPY TIME:  36 seconds, 22 mGy COMPARISON:  None IMPRESSION: 1. Technically successful exchange of tunneled right femoral hemodialysis catheter with fluoroscopic guidance. Ready for routine use. ACCESS: Remains approachable for percutaneous intervention as needed. Electronically Signed   By: Lucrezia Europe M.D.   On: 06/05/2017 11:20   Dg Toe Great Right  Result Date: 05/17/2017 CLINICAL DATA:  Diabetic ulcer of the right great toe. EXAM: RIGHT GREAT TOE COMPARISON:  Mar 15, 2017 FINDINGS: Soft tissue and osseous details are obscured by overlying bandage over the right great toe. There is soft tissue ulcer identified in the distal great toe. No bony destruction is identified to suggest osteomyelitis. IMPRESSION: Soft tissue and osseous details are obscured by overlying bandage over the right great toe. There is soft tissue ulcer identified in the distal great toe. No bony destruction is identified to suggest osteomyelitis. Electronically Signed   By: Abelardo Diesel M.D.   On: 05/17/2017 13:00    Microbiology: Recent Results (from the past 240 hour(s))  MRSA PCR Screening     Status: None   Collection Time: 06/06/17 12:25 AM  Result Value Ref Range Status   MRSA by PCR NEGATIVE NEGATIVE Final    Comment:        The GeneXpert MRSA Assay (FDA approved for NASAL specimens only), is one component of a comprehensive MRSA colonization surveillance program. It is not intended to diagnose MRSA infection nor to  guide or monitor treatment for MRSA infections.      Labs: Basic Metabolic Panel:  Recent Labs Lab 06/05/17 1947 06/05/17 1959 06/06/17 0436 06/07/17 0349  NA 134* 133* 134* 136  K 6.3* 6.1* 5.8* 4.0  CL 97* 101 100* 101  CO2 23  --  20* 24  GLUCOSE 92 91 198* 148*  BUN 78* 70* 83* 34*  CREATININE 8.12* 8.50* 8.22* 4.45*  CALCIUM 9.2  --  8.5* 8.0*   Liver Function Tests:  Recent Labs Lab 06/05/17 1947  AST 21  ALT 22  ALKPHOS 122  BILITOT 0.4  PROT 6.4*  ALBUMIN 3.1*   No results for input(s): LIPASE, AMYLASE in the last 168 hours. No results for input(s): AMMONIA in the last 168 hours. CBC:  Recent Labs Lab 06/05/17 1947 06/05/17 1959 06/06/17 0436 06/07/17 0349  WBC 7.6  --  7.1 6.1  NEUTROABS 4.5  --   --   --   HGB 12.0 12.9 10.5* 9.0*  HCT 37.2 38.0 32.1* 27.6*  MCV 85.9  --  84.7 85.7  PLT 151  --  128* 117*   Cardiac Enzymes: No results for input(s): CKTOTAL, CKMB, CKMBINDEX, TROPONINI in the last 168 hours. BNP: BNP (last 3 results) No results for input(s): BNP in the last 8760 hours.  ProBNP (last  3 results) No results for input(s): PROBNP in the last 8760 hours.  CBG:  Recent Labs Lab 06/06/17 1150 06/06/17 1749 06/06/17 2031 06/07/17 0752 06/07/17 1207  GLUCAP 183* 122* 175* 84 118*       Signed:  Senaya Dicenso  [facility] 06/07/2017, 1:33 PM

## 2017-06-08 DIAGNOSIS — T8249XD Other complication of vascular dialysis catheter, subsequent encounter: Secondary | ICD-10-CM | POA: Diagnosis not present

## 2017-06-08 DIAGNOSIS — N186 End stage renal disease: Secondary | ICD-10-CM | POA: Diagnosis not present

## 2017-06-08 DIAGNOSIS — E119 Type 2 diabetes mellitus without complications: Secondary | ICD-10-CM | POA: Diagnosis not present

## 2017-06-08 DIAGNOSIS — N2581 Secondary hyperparathyroidism of renal origin: Secondary | ICD-10-CM | POA: Diagnosis not present

## 2017-06-10 ENCOUNTER — Other Ambulatory Visit: Payer: Self-pay

## 2017-06-11 DIAGNOSIS — E1122 Type 2 diabetes mellitus with diabetic chronic kidney disease: Secondary | ICD-10-CM | POA: Diagnosis not present

## 2017-06-11 DIAGNOSIS — Z992 Dependence on renal dialysis: Secondary | ICD-10-CM | POA: Diagnosis not present

## 2017-06-11 DIAGNOSIS — T8249XD Other complication of vascular dialysis catheter, subsequent encounter: Secondary | ICD-10-CM | POA: Diagnosis not present

## 2017-06-11 DIAGNOSIS — N186 End stage renal disease: Secondary | ICD-10-CM | POA: Diagnosis not present

## 2017-06-11 DIAGNOSIS — N2581 Secondary hyperparathyroidism of renal origin: Secondary | ICD-10-CM | POA: Diagnosis not present

## 2017-06-11 DIAGNOSIS — E119 Type 2 diabetes mellitus without complications: Secondary | ICD-10-CM | POA: Diagnosis not present

## 2017-06-13 ENCOUNTER — Observation Stay (HOSPITAL_COMMUNITY)
Admission: RE | Admit: 2017-06-13 | Discharge: 2017-06-14 | Disposition: A | Discharge status: Skilled Nursing Facility | Payer: Medicare Other | Source: Ambulatory Visit | Attending: Vascular Surgery | Admitting: Vascular Surgery

## 2017-06-13 ENCOUNTER — Encounter (HOSPITAL_COMMUNITY)
Admission: RE | Disposition: A | Discharge status: Skilled Nursing Facility | Payer: Self-pay | Source: Ambulatory Visit | Attending: Vascular Surgery

## 2017-06-13 DIAGNOSIS — Z79899 Other long term (current) drug therapy: Secondary | ICD-10-CM | POA: Insufficient documentation

## 2017-06-13 DIAGNOSIS — Z882 Allergy status to sulfonamides status: Secondary | ICD-10-CM | POA: Diagnosis not present

## 2017-06-13 DIAGNOSIS — E1122 Type 2 diabetes mellitus with diabetic chronic kidney disease: Secondary | ICD-10-CM | POA: Insufficient documentation

## 2017-06-13 DIAGNOSIS — I998 Other disorder of circulatory system: Secondary | ICD-10-CM | POA: Diagnosis present

## 2017-06-13 DIAGNOSIS — E785 Hyperlipidemia, unspecified: Secondary | ICD-10-CM | POA: Diagnosis not present

## 2017-06-13 DIAGNOSIS — F411 Generalized anxiety disorder: Secondary | ICD-10-CM | POA: Diagnosis not present

## 2017-06-13 DIAGNOSIS — K219 Gastro-esophageal reflux disease without esophagitis: Secondary | ICD-10-CM | POA: Diagnosis not present

## 2017-06-13 DIAGNOSIS — Z91040 Latex allergy status: Secondary | ICD-10-CM | POA: Insufficient documentation

## 2017-06-13 DIAGNOSIS — Z880 Allergy status to penicillin: Secondary | ICD-10-CM | POA: Diagnosis not present

## 2017-06-13 DIAGNOSIS — Z8673 Personal history of transient ischemic attack (TIA), and cerebral infarction without residual deficits: Secondary | ICD-10-CM | POA: Diagnosis not present

## 2017-06-13 DIAGNOSIS — N186 End stage renal disease: Secondary | ICD-10-CM | POA: Insufficient documentation

## 2017-06-13 DIAGNOSIS — I132 Hypertensive heart and chronic kidney disease with heart failure and with stage 5 chronic kidney disease, or end stage renal disease: Secondary | ICD-10-CM | POA: Insufficient documentation

## 2017-06-13 DIAGNOSIS — D631 Anemia in chronic kidney disease: Secondary | ICD-10-CM | POA: Diagnosis not present

## 2017-06-13 DIAGNOSIS — F339 Major depressive disorder, recurrent, unspecified: Secondary | ICD-10-CM | POA: Diagnosis not present

## 2017-06-13 DIAGNOSIS — N2581 Secondary hyperparathyroidism of renal origin: Secondary | ICD-10-CM | POA: Diagnosis not present

## 2017-06-13 DIAGNOSIS — I480 Paroxysmal atrial fibrillation: Secondary | ICD-10-CM | POA: Insufficient documentation

## 2017-06-13 DIAGNOSIS — Z7982 Long term (current) use of aspirin: Secondary | ICD-10-CM | POA: Diagnosis not present

## 2017-06-13 DIAGNOSIS — I509 Heart failure, unspecified: Secondary | ICD-10-CM | POA: Insufficient documentation

## 2017-06-13 DIAGNOSIS — G40909 Epilepsy, unspecified, not intractable, without status epilepticus: Secondary | ICD-10-CM | POA: Insufficient documentation

## 2017-06-13 DIAGNOSIS — Z794 Long term (current) use of insulin: Secondary | ICD-10-CM | POA: Insufficient documentation

## 2017-06-13 DIAGNOSIS — Z7901 Long term (current) use of anticoagulants: Secondary | ICD-10-CM | POA: Insufficient documentation

## 2017-06-13 DIAGNOSIS — Z992 Dependence on renal dialysis: Secondary | ICD-10-CM | POA: Diagnosis not present

## 2017-06-13 DIAGNOSIS — E1142 Type 2 diabetes mellitus with diabetic polyneuropathy: Secondary | ICD-10-CM | POA: Diagnosis not present

## 2017-06-13 DIAGNOSIS — Z89612 Acquired absence of left leg above knee: Secondary | ICD-10-CM | POA: Diagnosis not present

## 2017-06-13 DIAGNOSIS — E1151 Type 2 diabetes mellitus with diabetic peripheral angiopathy without gangrene: Secondary | ICD-10-CM | POA: Insufficient documentation

## 2017-06-13 DIAGNOSIS — I739 Peripheral vascular disease, unspecified: Secondary | ICD-10-CM | POA: Diagnosis present

## 2017-06-13 DIAGNOSIS — E11621 Type 2 diabetes mellitus with foot ulcer: Secondary | ICD-10-CM | POA: Diagnosis not present

## 2017-06-13 DIAGNOSIS — L97919 Non-pressure chronic ulcer of unspecified part of right lower leg with unspecified severity: Secondary | ICD-10-CM | POA: Insufficient documentation

## 2017-06-13 DIAGNOSIS — Z86711 Personal history of pulmonary embolism: Secondary | ICD-10-CM | POA: Diagnosis not present

## 2017-06-13 DIAGNOSIS — Z87891 Personal history of nicotine dependence: Secondary | ICD-10-CM | POA: Insufficient documentation

## 2017-06-13 DIAGNOSIS — L97911 Non-pressure chronic ulcer of unspecified part of right lower leg limited to breakdown of skin: Secondary | ICD-10-CM | POA: Diagnosis not present

## 2017-06-13 DIAGNOSIS — I120 Hypertensive chronic kidney disease with stage 5 chronic kidney disease or end stage renal disease: Secondary | ICD-10-CM | POA: Diagnosis not present

## 2017-06-13 DIAGNOSIS — K5900 Constipation, unspecified: Secondary | ICD-10-CM

## 2017-06-13 HISTORY — PX: PERIPHERAL VASCULAR BALLOON ANGIOPLASTY: CATH118281

## 2017-06-13 HISTORY — PX: ABDOMINAL AORTOGRAM W/LOWER EXTREMITY: CATH118223

## 2017-06-13 LAB — POCT ACTIVATED CLOTTING TIME
ACTIVATED CLOTTING TIME: 257 s
ACTIVATED CLOTTING TIME: 219 s
ACTIVATED CLOTTING TIME: 202 s
ACTIVATED CLOTTING TIME: 186 s
Activated Clotting Time: 290 seconds

## 2017-06-13 LAB — CBC
HCT: 28.2 % — ABNORMAL LOW (ref 36.0–46.0)
Hemoglobin: 8.9 g/dL — ABNORMAL LOW (ref 12.0–15.0)
MCH: 27.8 pg (ref 26.0–34.0)
MCHC: 31.6 g/dL (ref 30.0–36.0)
MCV: 88.1 fL (ref 78.0–100.0)
Platelets: 174 10*3/uL (ref 150–400)
RBC: 3.2 MIL/uL — ABNORMAL LOW (ref 3.87–5.11)
RDW: 15.9 % — ABNORMAL HIGH (ref 11.5–15.5)
WBC: 6.8 10*3/uL (ref 4.0–10.5)

## 2017-06-13 LAB — GLUCOSE, CAPILLARY
GLUCOSE-CAPILLARY: 108 mg/dL — AB (ref 65–99)
Glucose-Capillary: 106 mg/dL — ABNORMAL HIGH (ref 65–99)
Glucose-Capillary: 130 mg/dL — ABNORMAL HIGH (ref 65–99)

## 2017-06-13 LAB — POCT I-STAT, CHEM 8
BUN: 31 mg/dL — AB (ref 6–20)
CREATININE: 5.3 mg/dL — AB (ref 0.44–1.00)
Calcium, Ion: 1.16 mmol/L (ref 1.15–1.40)
Chloride: 98 mmol/L — ABNORMAL LOW (ref 101–111)
GLUCOSE: 133 mg/dL — AB (ref 65–99)
HEMATOCRIT: 27 % — AB (ref 36.0–46.0)
Hemoglobin: 9.2 g/dL — ABNORMAL LOW (ref 12.0–15.0)
Potassium: 3.7 mmol/L (ref 3.5–5.1)
Sodium: 135 mmol/L (ref 135–145)
TCO2: 27 mmol/L (ref 0–100)

## 2017-06-13 LAB — RENAL FUNCTION PANEL
Albumin: 2.8 g/dL — ABNORMAL LOW (ref 3.5–5.0)
Anion gap: 7 (ref 5–15)
BUN: 33 mg/dL — ABNORMAL HIGH (ref 6–20)
CO2: 26 mmol/L (ref 22–32)
Calcium: 8.4 mg/dL — ABNORMAL LOW (ref 8.9–10.3)
Chloride: 96 mmol/L — ABNORMAL LOW (ref 101–111)
Creatinine, Ser: 5.66 mg/dL — ABNORMAL HIGH (ref 0.44–1.00)
GFR calc Af Amer: 8 mL/min — ABNORMAL LOW (ref >60)
GFR calc non Af Amer: 7 mL/min — ABNORMAL LOW (ref >60)
Glucose, Bld: 260 mg/dL — ABNORMAL HIGH (ref 65–99)
Phosphorus: 4.6 mg/dL (ref 2.5–4.6)
Potassium: 3.8 mmol/L (ref 3.5–5.1)
Sodium: 129 mmol/L — ABNORMAL LOW (ref 135–145)

## 2017-06-13 SURGERY — ABDOMINAL AORTOGRAM W/LOWER EXTREMITY
Anesthesia: LOCAL | Laterality: Right

## 2017-06-13 MED ORDER — IODIXANOL 320 MG/ML IV SOLN
INTRAVENOUS | Status: DC | PRN
  Administered 2017-06-13: 110 mL via INTRA_ARTERIAL

## 2017-06-13 MED ORDER — LABETALOL HCL 5 MG/ML IV SOLN
INTRAVENOUS | Status: DC | PRN
  Administered 2017-06-13: 10 mg via INTRAVENOUS

## 2017-06-13 MED ORDER — RENA-VITE PO TABS: 1.0000 | ORAL_TABLET | Freq: Every day | ORAL | Status: DC

## 2017-06-13 MED ORDER — HEPARIN SODIUM (PORCINE) 1000 UNIT/ML DIALYSIS: 3000.0000 [IU] | Freq: Once | INTRAMUSCULAR | Status: DC

## 2017-06-13 MED ORDER — HYDRALAZINE HCL 20 MG/ML IJ SOLN: 20.0000 mg | Freq: Four times a day (QID) | INTRAMUSCULAR | Status: DC | PRN

## 2017-06-13 MED ORDER — INSULIN ASPART 100 UNIT/ML ~~LOC~~ SOLN: 2.0000 [IU] | Freq: Three times a day (TID) | SUBCUTANEOUS | Status: DC | PRN

## 2017-06-13 MED ORDER — FENTANYL CITRATE (PF) 100 MCG/2ML IJ SOLN
INTRAMUSCULAR | Status: DC | PRN
  Administered 2017-06-13 (×2): 50 ug via INTRAVENOUS

## 2017-06-13 MED ORDER — PENTAFLUOROPROP-TETRAFLUOROETH EX AERO
1.0000 | INHALATION_SPRAY | CUTANEOUS | Status: DC | PRN
Start: 2017-06-13 — End: 2017-06-14

## 2017-06-13 MED ORDER — LIDOCAINE HCL (PF) 1 % IJ SOLN
INTRAMUSCULAR | Status: AC
  Filled 2017-06-13: qty 30

## 2017-06-13 MED ORDER — LIDOCAINE HCL (PF) 1 % IJ SOLN
INTRAMUSCULAR | Status: DC | PRN
  Administered 2017-06-13: 30 mL via INTRADERMAL

## 2017-06-13 MED ORDER — SODIUM CHLORIDE 0.9 % IV SOLN
100.0000 mL | INTRAVENOUS | Status: DC | PRN
Start: 2017-06-13 — End: 2017-06-14

## 2017-06-13 MED ORDER — DARBEPOETIN ALFA 60 MCG/0.3ML IJ SOSY
60.0000 ug | PREFILLED_SYRINGE | INTRAMUSCULAR | Status: AC
  Administered 2017-06-14: 60 ug via INTRAVENOUS
  Filled 2017-06-13: qty 0.3

## 2017-06-13 MED ORDER — NEPRO/CARBSTEADY PO LIQD
237.0000 mL | ORAL | Status: DC
  Administered 2017-06-13: 237 mL via ORAL

## 2017-06-13 MED ORDER — ATORVASTATIN CALCIUM 40 MG PO TABS
40.0000 mg | ORAL_TABLET | Freq: Every day | ORAL | Status: DC
  Administered 2017-06-13 – 2017-06-14 (×2): 40 mg via ORAL
  Filled 2017-06-13 (×2): qty 1

## 2017-06-13 MED ORDER — LABETALOL HCL 5 MG/ML IV SOLN
INTRAVENOUS | Status: AC
  Filled 2017-06-13: qty 4

## 2017-06-13 MED ORDER — DOXERCALCIFEROL 4 MCG/2ML IV SOLN: 1.0000 ug | INTRAVENOUS | Status: DC

## 2017-06-13 MED ORDER — ALTEPLASE 2 MG IJ SOLR
2.0000 mg | Freq: Once | INTRAMUSCULAR | Status: DC | PRN
  Filled 2017-06-13: qty 2

## 2017-06-13 MED ORDER — HEPARIN (PORCINE) IN NACL 2-0.9 UNIT/ML-% IJ SOLN
INTRAMUSCULAR | Status: AC | PRN
  Administered 2017-06-13: 1000 mL via INTRA_ARTERIAL

## 2017-06-13 MED ORDER — FENTANYL CITRATE (PF) 100 MCG/2ML IJ SOLN
INTRAMUSCULAR | Status: AC
  Filled 2017-06-13: qty 2

## 2017-06-13 MED ORDER — OXYCODONE-ACETAMINOPHEN 5-325 MG PO TABS: 1.0000 | ORAL_TABLET | ORAL | Status: DC | PRN

## 2017-06-13 MED ORDER — HEPARIN (PORCINE) IN NACL 2-0.9 UNIT/ML-% IJ SOLN
INTRAMUSCULAR | Status: AC
  Filled 2017-06-13: qty 1000

## 2017-06-13 MED ORDER — GABAPENTIN 100 MG PO CAPS
100.0000 mg | ORAL_CAPSULE | Freq: Every day | ORAL | Status: DC
  Administered 2017-06-13: 100 mg via ORAL
  Filled 2017-06-13: qty 1

## 2017-06-13 MED ORDER — POLYETHYLENE GLYCOL 3350 17 G PO PACK: 17.0000 g | PACK | Freq: Every day | ORAL | Status: DC

## 2017-06-13 MED ORDER — LIDOCAINE HCL (PF) 1 % IJ SOLN: 5.0000 mL | INTRAMUSCULAR | Status: DC | PRN

## 2017-06-13 MED ORDER — ONDANSETRON 4 MG PO TBDP
4.0000 mg | ORAL_TABLET | Freq: Three times a day (TID) | ORAL | Status: DC | PRN
  Filled 2017-06-13: qty 1

## 2017-06-13 MED ORDER — PRO-STAT SUGAR FREE PO LIQD
30.0000 mL | Freq: Two times a day (BID) | ORAL | Status: DC
  Administered 2017-06-14: 30 mL via ORAL
  Filled 2017-06-13: qty 30

## 2017-06-13 MED ORDER — ACETAMINOPHEN 325 MG PO TABS
650.0000 mg | ORAL_TABLET | ORAL | Status: DC | PRN
  Administered 2017-06-14: 650 mg via ORAL

## 2017-06-13 MED ORDER — HEPARIN SODIUM (PORCINE) 1000 UNIT/ML DIALYSIS: 1000.0000 [IU] | INTRAMUSCULAR | Status: DC | PRN

## 2017-06-13 MED ORDER — HEPARIN SODIUM (PORCINE) 1000 UNIT/ML IJ SOLN
INTRAMUSCULAR | Status: DC | PRN
  Administered 2017-06-13: 7000 [IU] via INTRAVENOUS

## 2017-06-13 MED ORDER — ONDANSETRON HCL 4 MG/2ML IJ SOLN: 4.0000 mg | Freq: Four times a day (QID) | INTRAMUSCULAR | Status: DC | PRN

## 2017-06-13 MED ORDER — ASPIRIN EC 81 MG PO TBEC
81.0000 mg | DELAYED_RELEASE_TABLET | Freq: Every day | ORAL | Status: DC
  Administered 2017-06-13 – 2017-06-14 (×2): 81 mg via ORAL
  Filled 2017-06-13 (×2): qty 1

## 2017-06-13 MED ORDER — LABETALOL HCL 5 MG/ML IV SOLN
20.0000 mg | INTRAVENOUS | Status: DC | PRN
  Administered 2017-06-13 (×2): 10 mg via INTRAVENOUS

## 2017-06-13 MED ORDER — LIDOCAINE-PRILOCAINE 2.5-2.5 % EX CREA
1.0000 "application " | TOPICAL_CREAM | CUTANEOUS | Status: DC | PRN
  Filled 2017-06-13: qty 5

## 2017-06-13 MED ORDER — MORPHINE SULFATE (PF) 4 MG/ML IV SOLN: 2.0000 mg | INTRAVENOUS | Status: DC | PRN

## 2017-06-13 MED ORDER — SODIUM CHLORIDE 0.9% FLUSH: 3.0000 mL | INTRAVENOUS | Status: DC | PRN

## 2017-06-13 MED ORDER — CITALOPRAM HYDROBROMIDE 20 MG PO TABS
10.0000 mg | ORAL_TABLET | Freq: Every day | ORAL | Status: DC
  Administered 2017-06-13 – 2017-06-14 (×2): 10 mg via ORAL
  Filled 2017-06-13 (×2): qty 1

## 2017-06-13 MED ORDER — CARVEDILOL 3.125 MG PO TABS
3.1250 mg | ORAL_TABLET | Freq: Two times a day (BID) | ORAL | Status: DC
  Administered 2017-06-13 – 2017-06-14 (×2): 3.125 mg via ORAL
  Filled 2017-06-13 (×2): qty 1

## 2017-06-13 MED ORDER — CALCIUM ACETATE (PHOS BINDER) 667 MG PO CAPS
667.0000 mg | ORAL_CAPSULE | Freq: Three times a day (TID) | ORAL | Status: DC
  Administered 2017-06-14: 667 mg via ORAL
  Filled 2017-06-13 (×2): qty 1

## 2017-06-13 SURGICAL SUPPLY — 27 items
BALLN ARMADA 3.0X120X150 (BALLOONS) ×1
BALLN ARMADA 3X120X150 (BALLOONS) ×1
BALLN IN.PACT DCB 4X150 (BALLOONS) ×2
BALLOON ARMADA 3X120X150 (BALLOONS) ×1 IMPLANT
CATH CROSS OVER TEMPO 5F (CATHETERS) ×2 IMPLANT
CATH CXI 2.3F 135 ST (CATHETERS) ×2 IMPLANT
CATH CXI SUPP ANG 2.6FR 150CM (MICROCATHETER) ×2 IMPLANT
CATH OMNI FLUSH 5F 65CM (CATHETERS) ×2 IMPLANT
COVER PRB 48X5XTLSCP FOLD TPE (BAG) ×1 IMPLANT
COVER PROBE 5X48 (BAG) ×1
DCB IN.PACT 4X150 (BALLOONS) ×1 IMPLANT
DEVICE CONTINUOUS FLUSH (MISCELLANEOUS) ×2 IMPLANT
DEVICE EMBOSHIELD NAV6 2.5-4.8 (WIRE) ×2 IMPLANT
DEVICE EN SNARE MINI 2-4MM (VASCULAR PRODUCTS) ×2 IMPLANT
GUIDEWIRE ANGLED .035X260CM (WIRE) ×2 IMPLANT
GUIDEWIRE STR TIP .014X300X8 (WIRE) ×2 IMPLANT
KIT ENCORE 26 ADVANTAGE (KITS) ×2 IMPLANT
KIT MICROINTRODUCER STIFF 5F (SHEATH) ×2 IMPLANT
KIT PV (KITS) ×2 IMPLANT
SHEATH FLEX ANSEL ST 6FR 45CM (SHEATH) ×2 IMPLANT
SHEATH PINNACLE 5F 10CM (SHEATH) ×2 IMPLANT
SYR MEDRAD MARK V 150ML (SYRINGE) ×2 IMPLANT
TRANSDUCER W/STOPCOCK (MISCELLANEOUS) ×2 IMPLANT
TRAY PV CATH (CUSTOM PROCEDURE TRAY) ×2 IMPLANT
WIRE BENTSON .035X145CM (WIRE) ×4 IMPLANT
WIRE G V18X300CM (WIRE) ×4 IMPLANT
WIRE VIPER ADVANCE .017X335CM (WIRE) ×4 IMPLANT

## 2017-06-13 NOTE — Progress Notes (Signed)
  Progress Note    06/13/2017 8:03 AM Day of Surgery  Subjective:  No new complaints  Vitals:   06/13/17 0537  BP: (!) 167/85  Pulse: 76  Resp: 18  Temp: 98.6 F (37 C)    Physical Exam: GENERAL: The patient is a well-nourished female, in no acute distress. The vital signs are documented above. CARDIAC: There is a regular rate and rhythm.  VASCULAR: Nonpalpable pedal pulses PULMONARY: Nonlabored respirations ABDOMEN: Soft and non-tender with normal pitched bowel sounds.  MUSCULOSKELETAL: Surgically absent left leg and toes right 1 through 4  NEUROLOGIC: No focal weakness or paresthesias are detected. SKIN: There are no ulcers or rashes noted. PSYCHIATRIC: The patient has a normal affect.  CBC    Component Value Date/Time   WBC 6.1 06/07/2017 0349   RBC 3.22 (L) 06/07/2017 0349   HGB 9.2 (L) 06/13/2017 0618   HCT 27.0 (L) 06/13/2017 0618   PLT 117 (L) 06/07/2017 0349   MCV 85.7 06/07/2017 0349   MCH 28.0 06/07/2017 0349   MCHC 32.6 06/07/2017 0349   RDW 15.8 (H) 06/07/2017 0349   LYMPHSABS 2.1 06/05/2017 1947   MONOABS 0.7 06/05/2017 1947   EOSABS 0.3 06/05/2017 1947   BASOSABS 0.0 06/05/2017 1947    BMET    Component Value Date/Time   NA 135 06/13/2017 0618   K 3.7 06/13/2017 0618   CL 98 (L) 06/13/2017 0618   CO2 24 06/07/2017 0349   GLUCOSE 133 (H) 06/13/2017 0618   BUN 31 (H) 06/13/2017 0618   CREATININE 5.30 (H) 06/13/2017 0618   CALCIUM 8.0 (L) 06/07/2017 0349   GFRNONAA 9 (L) 06/07/2017 0349   GFRAA 10 (L) 06/07/2017 0349    INR    Component Value Date/Time   INR 1.17 06/05/2017 1947    No intake or output data in the 24 hours ending 06/13/17 0803   Assessment:  71 y.o. female is s/p here with right great toe ulceration Plan: Aortogram with right lower extremity runoff and possible intervention   Natelie Ostrosky C. Donzetta Matters, MD [facility] Office: [phone] Pager: [phone]  06/13/2017 8:03 AM

## 2017-06-13 NOTE — Progress Notes (Signed)
Site area: Left groin a 7 french long arterial  Sheath was removed by Moishe Spice RN  Site Prior to Removal:  Level 0  Pressure Applied For 25 MINUTES    Bedrest Beginning at 1400p  Manual:   Yes.    Patient Status During Pull:  stable  Post Pull Groin Site:  Level 0  Post Pull Instructions Given:  Yes.    Post Pull Pulses Present:  Yes.    Dressing Applied:  Yes.    Comments:  VS remain  Stable during sheath pull.  BP med effective

## 2017-06-13 NOTE — Consult Note (Signed)
[facility] Renal Consultation Note    Indication for Consultation:  Management of ESRD/hemodialysis; anemia, hypertension/volume and secondary hyperparathyroidism PCP: Briscoe Deutscher, DO  HPI: Deborah Hendricks is a 71 y.o. [name] on hemodialysis T, Th, S at [facility]. PMH DMT2, HTN, PVD with L AKA, seizures, CVA, critical limb ischemia. She is a Restaurant manager, fast food.   She presents to for [name] for RLE critical limb ischemia per [facility]. She is being seen in holding area of cardiac cath lab S/P PTA of peroneal artery, PTA of R popliteal artery per Dr. Donzetta Matters. Currently she is sleeping, easily aroused, without C/O pain/discomfort. Says she has been doing well prior to coming back to hospital today, she has no new complaints. Decision about whether or not she will have amputation of R great toe still pending-Dr. Trula Slade to talk to patient later this afternoon.   Past Medical History:  Diagnosis Date  . Anemia   . Anxiety   . CHF (congestive heart failure) ([name])   . Constipation   . Critical lower limb ischemia   . Depression   . ESRD on dialysis [name])    "TTS; [place]" (11/27/2016)  . GERD (gastroesophageal reflux disease)   . Heart murmur   . HLD (hyperlipidemia)   . Hypertension   . Memory loss   . Peripheral vascular disease ([place])   . Refusal of blood transfusions as patient is Jehovah's Witness   . Seizures ([name]) 04/2015   s/p CVA.  [name] Sleep apnea    Noncompliant with CPAP.  [name] Stroke ([place]) 2015  . Type II diabetes mellitus ([name])    Past Surgical History:  Procedure Laterality Date  . ABDOMINAL HYSTERECTOMY    . AMPUTATION Left 12/15/2015   Procedure: LEFT ABOVE KNEE AMPUTATION;  Surgeon: Serafina Mitchell, MD;  Location: [place];  Service: Vascular;  Laterality: Left;  . BASCILIC VEIN TRANSPOSITION Right 11/25/2015   Procedure: Right Arm FIRST STAGE BASILIC VEIN TRANSPOSITION;  Surgeon: Serafina Mitchell, MD;  Location: [place];  Service:  Vascular;  Laterality: Right;  . BASCILIC VEIN TRANSPOSITION Right 01/19/2016   Procedure: RIGHT SECOND STAGE BASILIC VEIN TRANSPOSITION;  Surgeon: Serafina Mitchell, MD;  Location: [place];  Service: Vascular;  Laterality: Right;  . DILATION AND CURETTAGE OF UTERUS    . EYE SURGERY    . IR FLUORO GUIDE CV LINE RIGHT  06/05/2017  . PERIPHERAL VASCULAR CATHETERIZATION Bilateral 11/28/2015   Procedure: Lower Extremity Angiography;  Surgeon: Lorretta Harp, MD;  Location: [place] CV LAB;  Service: Cardiovascular;  Laterality: Bilateral;  . PERIPHERAL VASCULAR CATHETERIZATION N/A 11/28/2015   Procedure: Abdominal Aortogram;  Surgeon: Lorretta Harp, MD;  Location: [place] CV LAB;  Service: Cardiovascular;  Laterality: N/A;  . PERIPHERAL VASCULAR CATHETERIZATION Right 11/27/2016  . PERIPHERAL VASCULAR CATHETERIZATION N/A 11/27/2016   Procedure: Abdominal Aortogram w/Lower Extremity;  Surgeon: Serafina Mitchell, MD;  Location: [place] CV LAB;  Service: Cardiovascular;  Laterality: N/A;  . PERIPHERAL VASCULAR CATHETERIZATION N/A 11/27/2016   Procedure: Right arm and central venogram;  Surgeon: Serafina Mitchell, MD;  Location: [name];  Service: Cardiovascular;  Laterality: N/A;  . PERIPHERAL VASCULAR CATHETERIZATION Right 11/27/2016   Procedure: Peripheral Vascular Atherectomy;  Surgeon: Serafina Mitchell, MD;  Location: [name];  Service: Cardiovascular;  Laterality: Right;  Rt    sfa, popliteal, tibioperoneal trunk, peroneal  . PERIPHERAL VASCULAR CATHETERIZATION Right 11/27/2016   Procedure: Peripheral  Vascular Balloon Angioplasty;  Surgeon: Serafina Mitchell, MD;  Location: [name];  Service: Cardiovascular;  Laterality: Right;  Tibial peroneal  . RETINOPATHY SURGERY Bilateral   . TOE AMPUTATION Right    2nd, 3rd, 4th, 5th digits  . TONSILLECTOMY     Family History  Problem Relation Age of Onset  . Asthma Mother   . Cancer Sister    Social History:  reports  that she quit smoking about 2 years ago. Her smoking use included Cigarettes. She has a 12.75 pack-year smoking history. She has never used smokeless tobacco. She reports that she drinks alcohol. She reports that she does not use drugs. Allergies  Allergen Reactions  . Penicillins Hives, Itching, Rash and Other (See Comments)    Has patient had a PCN reaction causing immediate rash, facial/tongue/throat swelling, SOB or lightheadedness with hypotension: Yes Has patient had a PCN reaction causing severe rash involving mucus membranes or skin necrosis:unknown MAR source Has patient had a PCN reaction that required hospitalization:unknown MAR source Has patient had a PCN reaction occurring within the last 10 years:unknown MAR source If all of the above answers are "NO", then may proceed with Cephalosporin use.  [name] Amoxicillin Hives, Itching and Rash    Has patient had a PCN reaction causing immediate rash, facial/tongue/throat swelling, SOB or lightheadedness with hypotension:unknown MAR source Has patient had a PCN reaction causing severe rash involving mucus membranes or skin necrosis:unknown MAR source Has patient had a PCN reaction that required hospitalization:unknown MAR source Has patient had a PCN reaction occurring within the last 10 years: unknown MAR source If all of the above answers are "NO", then may proceed with Cephalosporin use.   . Latex Rash  . Sulfa Antibiotics Other (See Comments)    Unknown    Prior to Admission medications   Medication Sig Start Date End Date Taking? Authorizing Provider  Amino Acids-Protein Hydrolys (FEEDING SUPPLEMENT, PRO-STAT SUGAR FREE 64,) LIQD Take 30 mLs by mouth daily. Patient taking differently: Take 30 mLs by mouth 2 (two) times daily. (0900 & 2100) 02/01/17  Yes Asencion Partridge, MD  atorvastatin (LIPITOR) 40 MG tablet Take 1 tablet (40 mg total) by mouth daily. Patient taking differently: Take 40 mg by mouth daily. (0900) 03/07/17  Yes Briscoe Deutscher, DO  carvedilol (COREG) 3.125 MG tablet Take 1 tablet (3.125 mg total) by mouth 2 (two) times daily with a meal. 07/30/16  Yes Reyne Dumas, MD  citalopram (CELEXA) 10 MG tablet Take 10 mg by mouth daily. (0900) 09/27/16  Yes [provider]  esomeprazole (NEXIUM) 20 MG capsule Take 1 capsule (20 mg total) by mouth daily. 03/11/17  Yes Briscoe Deutscher, DO  gabapentin (NEURONTIN) 100 MG capsule Take 1 capsule (100 mg total) by mouth at bedtime. Patient taking differently: Take 100 mg by mouth 3 (three) times daily.  02/01/17  Yes Holley Raring, MD  insulin aspart (NOVOLOG) 100 UNIT/ML injection Inject 2-10 Units into the skin 3 (three) times daily as needed for high blood sugar. 151-200=2 units, 201-250=4 units, 251-300=6 units, 301-350=8 units, 351-400=10 units, >400=10 units and Call MD   Yes [provider]  multivitamin (RENA-VIT) TABS tablet Take 1 tablet by mouth daily. (0900)   Yes [provider]  Nutritional Supplements (FEEDING SUPPLEMENT, NEPRO CARB STEADY,) LIQD Take 237 mLs by mouth daily. Patient taking differently: Take 237 mLs by mouth daily with lunch.  02/02/17  Yes Asencion Partridge, MD  polyethylene glycol powder (GLYCOLAX/MIRALAX) powder Take 17 g  by mouth at bedtime. HOLD FOR LOOSE STOOLS    Yes [provider]  apixaban (ELIQUIS) 2.5 MG TABS tablet Take 2.5 mg by mouth 2 (two) times daily.    [provider]  aspirin EC 81 MG tablet Take 81 mg by mouth daily.    [provider]  calcium acetate (PHOSLO) 667 MG capsule Take 667 mg by mouth 3 (three) times daily with meals.     [provider]  ondansetron (ZOFRAN ODT) 4 MG disintegrating tablet Take 1 tablet (4 mg total) by mouth every 8 (eight) hours as needed for nausea or vomiting. 03/26/17   Horton, Barbette Hair, MD  psyllium (REGULOID) 0.52 g capsule One po tid prn constipation. Patient taking differently: Take 0.52 g by mouth 3 (three) times daily as needed (for  constipation.).  03/22/17   Briscoe Deutscher, DO   Current Facility-Administered Medications  Medication Dose Route Frequency Provider Last Rate Last Dose  . hydrALAZINE (APRESOLINE) injection 20 mg  20 mg Intravenous Q6H PRN Waynetta Sandy, MD      . labetalol (NORMODYNE,TRANDATE) injection 20 mg  20 mg Intravenous Q2H PRN Waynetta Sandy, MD   10 mg at 06/13/17 1329  . morphine 4 MG/ML injection 2 mg  2 mg Intravenous Q1H PRN Waynetta Sandy, MD      . ondansetron [facility]) injection 4 mg  4 mg Intravenous Q6H PRN Waynetta Sandy, MD      . sodium chloride flush (NS) 0.9 % injection 3 mL  3 mL Intravenous PRN Serafina Mitchell, MD       Labs: Basic Metabolic Panel:  Recent Labs Lab 06/07/17 0349 06/13/17 0618  NA 136 135  K 4.0 3.7  CL 101 98*  CO2 24  --   GLUCOSE 148* 133*  BUN 34* 31*  CREATININE 4.45* 5.30*  CALCIUM 8.0*  --    CBC:  Recent Labs Lab 06/07/17 0349 06/13/17 0618  WBC 6.1  --   HGB 9.0* 9.2*  HCT 27.6* 27.0*  MCV 85.7  --   PLT 117*  --    CBG:  Recent Labs Lab 06/07/17 0752 06/07/17 1207 06/07/17 1642 06/13/17 1008 06/13/17 1153  GLUCAP 84 118* 201* 106* 108*    Physical Exam: Vitals:   06/13/17 1204 06/13/17 1215 06/13/17 1230 06/13/17 1239  BP:  (!) 154/76 (!) 163/74 (!) 170/85  Pulse:  77 76 78  Resp: (!) 0 12 (!) 9 (!) 8  Temp:      TempSrc:      SpO2: (!) 0% 98% 98% 98%  Weight:      Height:         General: Pleasant elderly female in no acute distress. Head: Normocephalic, atraumatic, sclera non-icteric, mucus membranes are moist Neck: Supple. JVD not elevated. Lungs: Clear bilaterally to auscultation without wheezes, rales, or rhonchi. Breathing is unlabored. Heart: RRR with S1 S2.  2/6 systolic M. No rubs or gallops appreciated. Abdomen: Soft, non-tender, non-distended with normoactive bowel sounds. No rebound/guarding. No obvious abdominal masses. M-S:  Strength and tone appear normal  for age. Lower extremities: L AKA no stump edema. Drsg L fem CDI. Multiple scars, discolorations RLE with 1+ ankle edema. Prior Amputation of toes 1-4 R foot. Neuro: Alert and oriented X 3. Moves all extremities spontaneously. Psych:  Responds to questions appropriately with a normal affect. Dialysis Access: R femoral TDC, Drsg CDI.   Dialysis Orders: [place] T,Th,S 4 hrs 180 NRe 67 kg 2.0  K/ 2.25 Ca 400/800 UF Profile 4 Linear Na Heparin 3000 units IV TIW Hectoral 1 mcg IV TIW Mircera 50 mcg IV q 2 weeks (No dose given yet Last HGB 10 06/08/17 down from 13.2 05/30/17)    Assessment/Plan: 1.  Ulcer R Toe/Critical RLE ischemia: S/P aortagram with RLE runoff, PTA of peroneal artery, PTA of R popliteal artery per Dr. Donzetta Matters.   2.  ESRD -  T,Th,S. K+ 3.7. No urgent HD needs today-will have HD off schedule tomorrow then again Saturday to resume T,Th, S Schedule.  3.  Hypertension/volume  -BP slightly elevated. Has been leaving 1.9-3.8 above EDW last 2 treatments. Does not appear overtly volume overloaded. Wt this AM 68.9 kg.  Attempt UFG 2-2.5 liters as tolerated.  4.  Anemia  -HGB 9.2 Will give Aranesp 60 mcg IV with HD tomorrow.  5.  Metabolic bone disease -  Continue binders, VDRA. Phos has been at goal at OP clinic. Check labs with HD tomorrow.  6.  Nutrition - NPO at present. Renal/Carb mod diet when able to eat.  7. DM: per primary  Morry Veiga H. Owens Shark, NP-C 06/13/2017, 3:26 PM  [employer] [phone]

## 2017-06-13 NOTE — Op Note (Signed)
Patient name: Deborah Hendricks MRN: [id number] DOB: 1946/05/11 Sex: female  06/13/2017 Pre-operative Diagnosis: critical right lower extremity ischemia Post-operative diagnosis:  Same Surgeon:  Erlene Quan C. Donzetta Matters, MD Procedure Performed: 1.  US guided cannulation of left common femoral artery 2.  Aortogram with right lower extremity runoff 3.  Snare retrieval of wire fragment 4.  97mm balloon angioplasty of Right peroneal artery 5.  Drug coated balloon angioplasty of right popliteal artery with 24mm balloon  Indications:  71 year old female with a history of left lower extremity amputation and multiple toes amputated on her right foot. She now has an ulcer on her right great toe and is indicated for angiogram in preparation for possible indication of that toe.  Findings: The aortoiliac segments were free of flow-limiting disease although appeared heavily calcified radiographically. The SFA had a stenosis approximately 50% and the popliteal was stenosed down greater than 70%. TP trunk and proximal peroneal artery appeared to have stenosis greater than 70%. At completion this was resolved to less than 20% throughout the popliteal and SFA and TP trunk proximal peroneal artery. Unfortunately when crossing the lesion a V 14 wire segment was sheared and was retrieved via snare technique without complication.   Procedure:  The patient was identified in the holding area and taken to room 8.  The patient was then placed supine on the table and prepped and draped in the usual sterile fashion.  A time out was called.  Ultrasound was used to evaluate the left common femoral artery.  It was patent .  A digital ultrasound image was acquired.  A micropuncture needle was used to access the left common femoral artery under ultrasound guidance.  An 018 wire was advanced without resistance and a micropuncture sheath was placed.  The 018 wire was removed and a benson wire was placed.  The micropuncture sheath was  exchanged for a 5 french sheath.  An omniflush catheter was advanced over the wire to the level of L-1.  An abdominal angiogram was obtained.  Next, using the omniflush catheter and a benson wire, the aortic bifurcation was crossed and the catheter was placed into theright external iliac artery and right runoff was obtained.  We then decided to intervene on the right lower extremity and attempted to exchange for 6 French sheath but could not get the wire across to the Omni. Wire and catheter were then removed. The crossover catheter was then used with a Glidewire to cross the bifurcation we exchanged for Bentson wire and then were able to place a long 6 French sheath into the right common femoral artery. At this time the patient was heparinized with 7000 units of heparin and ACT returned greater than 290. We then used a V14 wire to cross through the proximal peroneal artery. This time tract and the catheter the wire had a shear injury leaving behind the tip of the wire. The wire was removed and this was confirmed. We first used a V 18 wire and CXI catheter to get across this year wire and the peroneal artery. We exchanged for a viper wire and attempted to cross with a NAV6 filter. We able to get the filter down past and on retrieval of the filter the wire was not retrieved. Filter was removed in total and this was confirmed. We then elected to use a small tulip type snare. We rewired the peroneal artery past the endo trash. We then exchanged for a snare catheter and brought the snare to the  peroneal artery. After 3 attempts we were able to snare the wire and the entire system was removed and we confirmed that the wire tip was indeed retrieved. Satisfied with this we then rewired the peroneal artery to the level of the foot and confirmed this angiographically. We then used a 3 mm balloon to dilate from the peroneal artery all the way back to the SFA. A 4 mm drug-coated balloon was then used at the level of the  popliteal artery and again repeated above that at the SFA as a plain balloon angioplasty. Runoff demonstrated resolution of our stenoses to less than 20%. Satisfied with this we elected to terminate the procedure. The sheath was retracted into the left external iliac artery. Wires were removed. Sheath will be pulled and postoperative morning.   Patient tolerated this well with complication of wire shear but was retrieved without any complication at completion.  Contrast: 110cc  Youlanda Tomassetti C. Donzetta Matters, MD [facility] Office: [phone] Pager: [phone]

## 2017-06-14 ENCOUNTER — Ambulatory Visit (HOSPITAL_BASED_OUTPATIENT_CLINIC_OR_DEPARTMENT_OTHER): Payer: Medicare Other

## 2017-06-14 ENCOUNTER — Encounter (HOSPITAL_COMMUNITY): Payer: Self-pay | Admitting: Vascular Surgery

## 2017-06-14 DIAGNOSIS — I132 Hypertensive heart and chronic kidney disease with heart failure and with stage 5 chronic kidney disease, or end stage renal disease: Secondary | ICD-10-CM | POA: Diagnosis not present

## 2017-06-14 DIAGNOSIS — D631 Anemia in chronic kidney disease: Secondary | ICD-10-CM | POA: Diagnosis not present

## 2017-06-14 DIAGNOSIS — I120 Hypertensive chronic kidney disease with stage 5 chronic kidney disease or end stage renal disease: Secondary | ICD-10-CM | POA: Diagnosis not present

## 2017-06-14 DIAGNOSIS — Z89612 Acquired absence of left leg above knee: Secondary | ICD-10-CM | POA: Diagnosis not present

## 2017-06-14 DIAGNOSIS — I998 Other disorder of circulatory system: Secondary | ICD-10-CM | POA: Diagnosis present

## 2017-06-14 DIAGNOSIS — Z992 Dependence on renal dialysis: Secondary | ICD-10-CM | POA: Diagnosis not present

## 2017-06-14 DIAGNOSIS — E11621 Type 2 diabetes mellitus with foot ulcer: Secondary | ICD-10-CM | POA: Diagnosis not present

## 2017-06-14 DIAGNOSIS — I509 Heart failure, unspecified: Secondary | ICD-10-CM | POA: Diagnosis not present

## 2017-06-14 DIAGNOSIS — N186 End stage renal disease: Secondary | ICD-10-CM | POA: Diagnosis not present

## 2017-06-14 DIAGNOSIS — L97919 Non-pressure chronic ulcer of unspecified part of right lower leg with unspecified severity: Secondary | ICD-10-CM | POA: Diagnosis not present

## 2017-06-14 DIAGNOSIS — N2581 Secondary hyperparathyroidism of renal origin: Secondary | ICD-10-CM | POA: Diagnosis not present

## 2017-06-14 DIAGNOSIS — E1122 Type 2 diabetes mellitus with diabetic chronic kidney disease: Secondary | ICD-10-CM | POA: Diagnosis not present

## 2017-06-14 LAB — RENAL FUNCTION PANEL
Albumin: 2.6 g/dL — ABNORMAL LOW (ref 3.5–5.0)
Anion gap: 8 (ref 5–15)
BUN: 38 mg/dL — ABNORMAL HIGH (ref 6–20)
CO2: 26 mmol/L (ref 22–32)
Calcium: 8.3 mg/dL — ABNORMAL LOW (ref 8.9–10.3)
Chloride: 99 mmol/L — ABNORMAL LOW (ref 101–111)
Creatinine, Ser: 6.13 mg/dL — ABNORMAL HIGH (ref 0.44–1.00)
GFR calc Af Amer: 7 mL/min — ABNORMAL LOW (ref >60)
GFR calc non Af Amer: 6 mL/min — ABNORMAL LOW (ref >60)
Glucose, Bld: 139 mg/dL — ABNORMAL HIGH (ref 65–99)
Phosphorus: 4.7 mg/dL — ABNORMAL HIGH (ref 2.5–4.6)
Potassium: 4.4 mmol/L (ref 3.5–5.1)
Sodium: 133 mmol/L — ABNORMAL LOW (ref 135–145)

## 2017-06-14 LAB — CBC
HCT: 25.4 % — ABNORMAL LOW (ref 36.0–46.0)
Hemoglobin: 8.1 g/dL — ABNORMAL LOW (ref 12.0–15.0)
MCH: 27.6 pg (ref 26.0–34.0)
MCHC: 31.9 g/dL (ref 30.0–36.0)
MCV: 86.7 fL (ref 78.0–100.0)
Platelets: 168 K/uL (ref 150–400)
RBC: 2.93 MIL/uL — ABNORMAL LOW (ref 3.87–5.11)
RDW: 15.9 % — ABNORMAL HIGH (ref 11.5–15.5)
WBC: 7.2 K/uL (ref 4.0–10.5)

## 2017-06-14 MED ORDER — ACETAMINOPHEN 325 MG PO TABS
ORAL_TABLET | ORAL | Status: AC
  Administered 2017-06-14: 650 mg via ORAL
  Filled 2017-06-14: qty 2

## 2017-06-14 MED ORDER — DARBEPOETIN ALFA 60 MCG/0.3ML IJ SOSY
PREFILLED_SYRINGE | INTRAMUSCULAR | Status: AC
  Administered 2017-06-14: 60 ug via INTRAVENOUS
  Filled 2017-06-14: qty 0.3

## 2017-06-14 MED ORDER — GABAPENTIN 100 MG PO CAPS: 100.0000 mg | ORAL_CAPSULE | Freq: Three times a day (TID) | ORAL | Status: DC

## 2017-06-14 MED ORDER — RENA-VITE PO TABS: 1.0000 | ORAL_TABLET | Freq: Every day | ORAL | Status: DC

## 2017-06-14 MED ORDER — PRO-STAT SUGAR FREE PO LIQD: 30.0000 mL | Freq: Two times a day (BID) | ORAL | Status: AC

## 2017-06-14 MED ORDER — APIXABAN 2.5 MG PO TABS: 2.5000 mg | ORAL_TABLET | Freq: Two times a day (BID) | ORAL | Status: DC

## 2017-06-14 NOTE — H&P (Signed)
[facility] [place]  Patient name: Deborah Hendricks   MRN: [id number]        DOB: 1945-11-14            Sex: female   REQUESTING PROVIDER:    Hospital service   REASON FOR CONSULT:    Right great toe ulcer  HISTORY OF PRESENT ILLNESS:   Rashay Hendricks a 71 y.o.femalewho returns today for evaluation of a right leg ulcer. This has been present for many weeks. She is status post left above-knee amputation on 12/15/2015. She also suffers from end-stage renal disease. She has undergone a basilic vein procedure which has not developed. She has a catheter in her right thigh. He has a history of stroke. She is on anticoagulation. She suffers from diabetes with multiple complications.  On 11/27/2016 she underwent atherectomy and drug coated balloon angioplasty of the superficial femoral and popliteal artery on the right. She also had balloon angioplasty of the stenosis within the tibioperoneal trunk and proximal peroneal artery. She has single-vessel runoff via the peroneal artery.  The patient was scheduled to undergo angiography 3 days ago however, she did not show up.  She has elevated velocities within the proximal and mid superficial femoral arteries.  This had previously been treated with a 1.5 CSI classic device and a 4 mm drug coated balloon.  The tibioperoneal trunk was treated with primary balloon angioplasty using a 3 mm balloon.  PAST MEDICAL HISTORY        Past Medical History:  Diagnosis Date  . Anemia   . Anxiety   . CHF (congestive heart failure) ([place])   . Constipation   . Critical lower limb ischemia   . Depression   . ESRD on dialysis [facility])    "TTS; [place]" (11/27/2016)  . GERD (gastroesophageal reflux disease)   . Heart murmur   . HLD (hyperlipidemia)   . Hypertension   . Memory loss   . Peripheral vascular disease ([place])   . Refusal of blood transfusions as patient is Jehovah's Witness   .  Seizures ([place]) 04/2015   s/p CVA.  [name] Sleep apnea    Noncompliant with CPAP.  [name] Stroke ([place]) 2015  . Type II diabetes mellitus ([place])      FAMILY HISTORY        Family History  Problem Relation Age of Onset  . Asthma Mother   . Cancer Sister     SOCIAL HISTORY:   Social History        Social History  . Marital status: Single    Spouse name: N/A  . Number of children: N/A  . Years of education: N/A      Occupational History  . Not on file.         Social History Main Topics  . Smoking status: Former Smoker    Packs/day: 0.25    Years: 51.00    Types: Cigarettes    Quit date: 04/13/2015  . Smokeless tobacco: Never Used  . Alcohol use 0.0 oz/week     Comment: 11/27/2016 "stopped in 04/2015"  . Drug use: No  . Sexual activity: No       Other Topics Concern  . Not on file      Social History Narrative  . No narrative on file    ALLERGIES:         Allergies  Allergen Reactions  . Penicillins Hives, Itching, Rash and Other (See Comments)    Has  patient had a PCN reaction causing immediate rash, facial/tongue/throat swelling, SOB or lightheadedness with hypotension: Yes Has patient had a PCN reaction causing severe rash involving mucus membranes or skin necrosis:unknown MAR source Has patient had a PCN reaction that required hospitalization:unknown MAR source Has patient had a PCN reaction occurring within the last 10 years:unknown MAR source If all of the above answers are "NO", then may proceed with Cephalosporin use.  [name] Amoxicillin Hives, Itching and Rash    Has patient had a PCN reaction causing immediate rash, facial/tongue/throat swelling, SOB or lightheadedness with hypotension:unknown MAR source Has patient had a PCN reaction causing severe rash involving mucus membranes or skin necrosis:unknown MAR source Has patient had a PCN reaction that required hospitalization:unknown MAR source Has patient had a PCN  reaction occurring within the last 10 years: unknown MAR source If all of the above answers are "NO", then may proceed with Cephalosporin use.   . Latex Rash  . Sulfa Antibiotics Other (See Comments)    Unknown     CURRENT MEDICATIONS:             Current Facility-Administered Medications  Medication Dose Route Frequency Provider Last Rate Last Dose  . 0.9 %  sodium chloride infusion  250 mL Intravenous PRN Serafina Mitchell, MD      . acetaminophen (TYLENOL) tablet 650 mg  650 mg Oral Q6H PRN Ivor Costa, MD   650 mg at 06/06/17 0334   Or  . acetaminophen (TYLENOL) suppository 650 mg  650 mg Rectal Q6H PRN Ivor Costa, MD      . acidophilus (RISAQUAD) capsule 1 capsule  1 capsule Oral TID Ivor Costa, MD   1 capsule at 06/06/17 2125  . atorvastatin (LIPITOR) tablet 40 mg  40 mg Oral Daily Ivor Costa, MD   40 mg at 06/06/17 0917  . calcium acetate (PHOSLO) capsule 1,334 mg  1,334 mg Oral TID WC Ivor Costa, MD   1,334 mg at 06/07/17 0803  . carvedilol (COREG) tablet 3.125 mg  3.125 mg Oral BID WC Ivor Costa, MD   3.125 mg at 06/07/17 0834  . citalopram (CELEXA) tablet 10 mg  10 mg Oral Q24H Ivor Costa, MD   10 mg at 06/07/17 0834  . dextrose 50 % solution 50 mL  50 mL Intravenous PRN Ivor Costa, MD      . doxercalciferol (HECTOROL) injection 1 mcg  1 mcg Intravenous Q T,Th,Sa-HD Lynnda Child, PA-C   1 mcg at 06/06/17 1627  . feeding supplement (NEPRO CARB STEADY) liquid 237 mL  237 mL Oral Q24H Ivor Costa, MD   237 mL at 06/06/17 0918  . feeding supplement (PRO-STAT SUGAR FREE 64) liquid 30 mL  30 mL Oral BID Ivor Costa, MD   30 mL at 06/06/17 0917  . gabapentin (NEURONTIN) capsule 100 mg  100 mg Oral QHS Ivor Costa, MD   100 mg at 06/06/17 2125  . hydrALAZINE (APRESOLINE) injection 5 mg  5 mg Intravenous Q2H PRN Ivor Costa, MD      . insulin aspart (novoLOG) injection 0-5 Units  0-5 Units Subcutaneous QHS Ivor Costa, MD      . insulin aspart (novoLOG) injection 0-9 Units   0-9 Units Subcutaneous TID WC Ivor Costa, MD   1 Units at 06/06/17 1818  . multivitamin (RENA-VIT) tablet 1 tablet  1 tablet Oral Daily Ivor Costa, MD   1 tablet at 06/06/17 [phone]  . ondansetron (ZOFRAN) injection 4 mg  4 mg Intravenous  Q8H PRN Ivor Costa, MD      . ondansetron (ZOFRAN-ODT) disintegrating tablet 4 mg  4 mg Oral Q8H PRN Ivor Costa, MD      . pantoprazole (PROTONIX) EC tablet 40 mg  40 mg Oral Daily Ivor Costa, MD   40 mg at 06/06/17 1448  . polycarbophil (FIBERCON) tablet 625 mg  625 mg Oral TID PRN Ivor Costa, MD      . polyethylene glycol (MIRALAX / GLYCOLAX) packet 17 g  17 g Oral Daily Ivor Costa, MD   17 g at 06/06/17 0917  . sodium chloride flush (NS) 0.9 % injection 3 mL  3 mL Intravenous Q12H Serafina Mitchell, MD   3 mL at 06/06/17 2128  . sodium chloride flush (NS) 0.9 % injection 3 mL  3 mL Intravenous PRN Serafina Mitchell, MD      . zolpidem (AMBIEN) tablet 5 mg  5 mg Oral QHS PRN Ivor Costa, MD        REVIEW OF SYSTEMS:   [X]  denotes positive finding, [ ]  denotes negative finding Cardiac  Comments:  Chest pain or chest pressure:    Shortness of breath upon exertion:    Short of breath when lying flat:    Irregular heart rhythm:        Vascular    Pain in calf, thigh, or hip brought on by ambulation:    Pain in feet at night that wakes you up from your sleep:     Blood clot in your veins:    Leg swelling:         Pulmonary    Oxygen at home:    Productive cough:     Wheezing:         Neurologic    Sudden weakness in arms or legs:     Sudden numbness in arms or legs:     Sudden onset of difficulty speaking or slurred speech:    Temporary loss of vision in one eye:     Problems with dizziness:         Gastrointestinal    Blood in stool:      Vomited blood:         Genitourinary    Burning when urinating:     Blood in urine:        Psychiatric    Major depression:           Hematologic    Bleeding problems: x   Problems with blood clotting too easily:        Skin    Rashes or ulcers: x       Constitutional    Fever or chills:     PHYSICAL EXAM:         Vitals:   06/06/17 1715 06/06/17 1753 06/06/17 2026 06/07/17 0423  BP: 111/70 (!) 128/43 (!) 136/59 (!) 92/49  Pulse: 82 86 90 88  Resp: 17 18 18 18   Temp: 98.2 F (36.8 C) 98.3 F (36.8 C) 98.4 F (36.9 C) 98.7 F (37.1 C)  TempSrc: Oral Oral Oral Oral  SpO2: 100% 100% 100% 100%  Weight: 148 lb 9.4 oz (67.4 kg)  152 lb (68.9 kg)   Height:        GENERAL: The patient is a well-nourished female, in no acute distress. The vital signs are documented above. CARDIAC: There is a regular rate and rhythm.  VASCULAR: Nonpalpable pedal pulses PULMONARY: Nonlabored respirations ABDOMEN: Soft and non-tender with normal pitched bowel  sounds.  MUSCULOSKELETAL: Surgically absent left leg and toes right 1 through 4  NEUROLOGIC: No focal weakness or paresthesias are detected. SKIN: There are no ulcers or rashes noted. PSYCHIATRIC: The patient has a normal affect.  STUDIES:   None  ASSESSMENT and PLAN   Per Dr. Trula Slade note. Angiogram followed by toe amputation next week.   Collier Monica C. Donzetta Matters, MD [facility] [place] Office: [phone] Pager: [phone]

## 2017-06-14 NOTE — Procedures (Signed)
Patient seen on Hemodialysis. QB 350, UF goal 1L Treatment adjusted as needed.  Elmarie Shiley MD [facility]. Office # [phone] Pager # [phone] 9:00 AM

## 2017-06-14 NOTE — Progress Notes (Signed)
Order received to discharge patient.  Telemetry monitor removed and CCMD notified.  PIV access removed.  Report called to SNF.

## 2017-06-14 NOTE — Progress Notes (Addendum)
Clinical Social Worker facilitated patient discharge including contacting patient family and facility to confirm patient discharge plans.  Clinical information faxed to facility and family agreeable with plan.  [facility] stated they will pick up patient at hospital and take her back to [facility] .  RN Anderson Malta  to call [phone] (pt will go back into rm# [phone]) for report prior to discharge.  Clinical Social Worker will sign off for now as social work intervention is no longer needed. Please consult [place] again if new need arises.  Rhea Pink, MSW, [place]

## 2017-06-14 NOTE — Discharge Instructions (Signed)
Please continue your Eliquis 06/14/17. Discontinue Eliquis on Monday 06/17/17. Do not take your Eliquis on 06/17/17 in preparation for surgery on 06/19/17.

## 2017-06-14 NOTE — Care Management Obs Status (Signed)
[place] NOTIFICATION   Patient Details  Name: Deborah Hendricks MRN: [id number] Date of Birth: 08/17/1946   Medicare Observation Status Notification Given:  Yes    Dawayne Patricia, RN 06/14/2017, 1:17 PM

## 2017-06-14 NOTE — Discharge Summary (Signed)
[facility] Specialists Discharge Summary  Deborah Hendricks [birth year] 71 y.o. female  [id number]  Admission Date: 06/13/2017  Discharge Date: 06/14/2017  Physician: Thomes Lolling*  Admission Diagnosis: PAD (peripheral artery disease) ([place]) [I73.9]  HPI:  This is a 71 y.o. female who returns today for evaluation of a right leg ulcer. This has been present for many weeks. She is status post left above-knee amputation on 12/15/2015. She also suffers from end-stage renal disease. She has undergone a basilic vein procedure which has not developed. She has a catheter in her right thigh. He has a history of stroke. She is on anticoagulation. She suffers from diabetes with multiple complications.  On 11/27/2016 she underwent atherectomy and drug coated balloon angioplasty of the superficial femoral and popliteal artery on the right. She also had balloon angioplasty of the stenosis within the tibioperoneal trunk and proximal peroneal artery. She has single-vessel runoff via the peroneal artery.  The patient was scheduled to undergo angiography 3 days ago however, she did not show up. She has elevated velocities within the proximal and mid superficial femoral arteries. This had previously been treated with a 1.5 CSI classic device and a 4 mm drug coated balloon. The tibioperoneal trunk was treated with primary balloon angioplasty using a 3 mm balloon.  Hospital Course:  The patient was admitted to the hospital and taken to the [facility] lab on 06/13/2017 and underwent:   1.  US guided cannulation of left common femoral artery 2.  Aortogram with right lower extremity runoff 3.  Snare retrieval of wire fragment 4.  53mm balloon angioplasty of Right peroneal artery 5.  Drug coated balloon angioplasty of right popliteal artery with 27mm balloon    The patient tolerated the procedure well and was transported to the PACU in stable condition. Nephrology was consulted for HD.    She was doing well post-procedure. She was dialyzed on 06/14/17. She will restart her Eliquis on 06/14/17 and hold her anticoagulation on 06/17/17. She will return on 06/19/17 for right great toe amputation. She was discharged to SNF on 06/14/17 in good condition.      CBC    Component Value Date/Time   WBC 7.2 06/14/2017 0730   RBC 2.93 (L) 06/14/2017 0730   HGB 8.1 (L) 06/14/2017 0730   HCT 25.4 (L) 06/14/2017 0730   PLT 168 06/14/2017 0730   MCV 86.7 06/14/2017 0730   MCH 27.6 06/14/2017 0730   MCHC 31.9 06/14/2017 0730   RDW 15.9 (H) 06/14/2017 0730   LYMPHSABS 2.1 06/05/2017 1947   MONOABS 0.7 06/05/2017 1947   EOSABS 0.3 06/05/2017 1947   BASOSABS 0.0 06/05/2017 1947    BMET    Component Value Date/Time   NA 133 (L) 06/14/2017 0730   K 4.4 06/14/2017 0730   CL 99 (L) 06/14/2017 0730   CO2 26 06/14/2017 0730   GLUCOSE 139 (H) 06/14/2017 0730   BUN 38 (H) 06/14/2017 0730   CREATININE 6.13 (H) 06/14/2017 0730   CALCIUM 8.3 (L) 06/14/2017 0730   GFRNONAA 6 (L) 06/14/2017 0730   GFRAA 7 (L) 06/14/2017 0730     Discharge Instructions:   The patient is discharged to SNF with extensive instructions on wound care and progressive ambulation.  They are instructed not to drive or perform any heavy lifting until returning to see the physician in his office.  Discharge Instructions    Call MD for:  redness, tenderness, or signs of infection (pain, swelling, bleeding, redness, odor or green/yellow  discharge around incision site)    Complete by:  As directed    Call MD for:  severe or increased pain, loss or decreased feeling  in affected limb(s)    Complete by:  As directed    Call MD for:  temperature >100.5    Complete by:  As directed    Discharge wound care:    Complete by:  As directed    If you have a dressing on your groin, you can take it off when you get home. Wash area with soap and water and pat dry. You do not have to reapply a dressing.   Driving Restrictions     Complete by:  As directed    No driving for 2 weeks   Increase activity slowly    Complete by:  As directed    Walk with assistance use walker or cane as needed   Lifting restrictions    Complete by:  As directed    No lifting for 2 weeks   Resume previous diet    Complete by:  As directed       Discharge Diagnosis:  PAD (peripheral artery disease) ([place]) [I73.9]  Secondary Diagnosis: Patient Active Problem List   Diagnosis Date Noted  . Ischemia of right lower extremity 06/14/2017  . Hyperkalemia 06/05/2017  . Bleeding due to dialysis catheter placement ([place]) 06/05/2017  . History of hysterectomy 03/09/2017  . Former smoker 03/09/2017  . Dysphagia 03/09/2017  . Carotid stenosis 03/09/2017  . Need for assistance due to reduced mobility 03/09/2017  . Complication of vascular access for dialysis 03/09/2017  . Anemia in chronic kidney disease (CKD) 03/09/2017  . Sleep apnea   . Refusal of blood transfusions as patient is Jehovah's Witness   . Constipation   . GERD (gastroesophageal reflux disease)   . HLD (hyperlipidemia)   . TIA (transient ischemic attack) 02/01/2017  . Diabetic ulcer of toe of right foot associated with diabetes mellitus due to underlying condition, limited to breakdown of skin ([name])   . PVD (peripheral vascular disease) ([place]) 11/27/2016  . Blurry vision   . Asymmetric SNHL (sensorineural hearing loss) 06/12/2016  . Peripheral neuropathy 06/11/2016  . History of pulmonary embolism 06/08/2016  . History of CVA with residual deficit 06/08/2016  . History of borderline personality disorder 04/19/2016  . History of medication noncompliance 04/19/2016  . Paroxysmal atrial fibrillation ([place]) 04/19/2016  . Seizure disorder ([place]) 04/19/2016  . Renovascular hypertension 04/13/2016  . ESRD on dialysis ([place]) 04/11/2016  . Generalized anxiety disorder 04/11/2016  . Major depression, recurrent, chronic ([place]) 04/11/2016  . S/P AKA (above knee amputation)  unilateral, left ([place]) 04/11/2016  . PAD (peripheral artery disease) ([place]) 12/15/2015  . Type II diabetes mellitus with neurological manifestations ([place]) 10/21/2015  . Seizures ([place]) 04/13/2015   Past Medical History:  Diagnosis Date  . Anemia   . Anxiety   . CHF (congestive heart failure) ([place])   . Constipation   . Critical lower limb ischemia   . Depression   . ESRD on dialysis [facility])    "TTS; [place]" (11/27/2016)  . GERD (gastroesophageal reflux disease)   . Heart murmur   . HLD (hyperlipidemia)   . Hypertension   . Memory loss   . Peripheral vascular disease ([place])   . Refusal of blood transfusions as patient is Jehovah's Witness   . Seizures ([place]) 04/2015   s/p CVA.  [name] Sleep apnea    Noncompliant with CPAP.  [name] Stroke ([place]) 2015  .  Type II diabetes mellitus ([facility])      Allergies as of 06/14/2017      Reactions   Penicillins Hives, Itching, Rash, Other (See Comments)   Has patient had a PCN reaction causing immediate rash, facial/tongue/throat swelling, SOB or lightheadedness with hypotension: Yes Has patient had a PCN reaction causing severe rash involving mucus membranes or skin necrosis:unknown MAR source Has patient had a PCN reaction that required hospitalization:unknown MAR source Has patient had a PCN reaction occurring within the last 10 years:unknown MAR source If all of the above answers are "NO", then may proceed with Cephalosporin use.   Amoxicillin Hives, Itching, Rash   Has patient had a PCN reaction causing immediate rash, facial/tongue/throat swelling, SOB or lightheadedness with hypotension:unknown MAR source Has patient had a PCN reaction causing severe rash involving mucus membranes or skin necrosis:unknown MAR source Has patient had a PCN reaction that required hospitalization:unknown MAR source Has patient had a PCN reaction occurring within the last 10 years: unknown MAR source If all of the above answers are "NO", then may proceed with Cephalosporin  use.   Latex Rash   Sulfa Antibiotics Other (See Comments)   Unknown       Medication List    TAKE these medications   apixaban 2.5 MG Tabs tablet Commonly known as:  ELIQUIS Take 1 tablet (2.5 mg total) by mouth 2 (two) times daily.   aspirin EC 81 MG tablet Take 81 mg by mouth daily.   atorvastatin 40 MG tablet Commonly known as:  LIPITOR Take 1 tablet (40 mg total) by mouth daily. What changed:  additional instructions   calcium acetate 667 MG capsule Commonly known as:  PHOSLO Take 667 mg by mouth 3 (three) times daily with meals.   carvedilol 3.125 MG tablet Commonly known as:  COREG Take 1 tablet (3.125 mg total) by mouth 2 (two) times daily with a meal.   citalopram 10 MG tablet Commonly known as:  CELEXA Take 10 mg by mouth daily. (0900)   esomeprazole 20 MG capsule Commonly known as:  NEXIUM Take 1 capsule (20 mg total) by mouth daily.   feeding supplement (NEPRO CARB STEADY) Liqd Take 237 mLs by mouth daily. What changed:  when to take this   feeding supplement (PRO-STAT SUGAR FREE 64) Liqd Take 30 mLs by mouth 2 (two) times daily. (0900 & 2100) What changed:  when to take this  additional instructions   gabapentin 100 MG capsule Commonly known as:  NEURONTIN Take 1 capsule (100 mg total) by mouth 3 (three) times daily.   insulin aspart 100 UNIT/ML injection Commonly known as:  novoLOG Inject 2-10 Units into the skin 3 (three) times daily as needed for high blood sugar. 151-200=2 units, 201-250=4 units, 251-300=6 units, 301-350=8 units, 351-400=10 units, >400=10 units and Call MD   multivitamin Tabs tablet Take 1 tablet by mouth daily. (0900)   ondansetron 4 MG disintegrating tablet Commonly known as:  ZOFRAN ODT Take 1 tablet (4 mg total) by mouth every 8 (eight) hours as needed for nausea or vomiting.   polyethylene glycol powder powder Commonly known as:  GLYCOLAX/MIRALAX Take 17 g by mouth at bedtime. HOLD FOR LOOSE STOOLS   psyllium  0.52 g capsule Commonly known as:  REGULOID One po tid prn constipation. What changed:  how much to take  how to take this  when to take this  reasons to take this  additional instructions       Disposition: SNF  Patient's  condition: is Good  Follow up: 1. Dr. Trula Slade on 06/19/17 for toe amputation    Virgina Jock, PA-C [facility] [phone] 06/14/2017  1:29 PM

## 2017-06-14 NOTE — Progress Notes (Signed)
Central tele called-no monitoring orders in system. Placed orders due to apnea alarm has briefly come on tonight.

## 2017-06-14 NOTE — Care Management CC44 (Signed)
Condition Code 44 Documentation Completed  Patient Details  Name: Deborah Hendricks MRN: [id number] Date of Birth: [birth year]   Condition Code 44 given:  Yes Patient signature on Condition Code 44 notice:  Yes Documentation of 2 MD's agreement:  Yes Code 44 added to claim:  Yes    Dawayne Patricia, RN 06/14/2017, 1:18 PM

## 2017-06-17 ENCOUNTER — Other Ambulatory Visit: Payer: Self-pay

## 2017-06-18 DIAGNOSIS — E119 Type 2 diabetes mellitus without complications: Secondary | ICD-10-CM | POA: Diagnosis not present

## 2017-06-18 DIAGNOSIS — D631 Anemia in chronic kidney disease: Secondary | ICD-10-CM | POA: Diagnosis not present

## 2017-06-18 DIAGNOSIS — N186 End stage renal disease: Secondary | ICD-10-CM | POA: Diagnosis not present

## 2017-06-18 DIAGNOSIS — N2581 Secondary hyperparathyroidism of renal origin: Secondary | ICD-10-CM | POA: Diagnosis not present

## 2017-06-18 DIAGNOSIS — T8249XD Other complication of vascular dialysis catheter, subsequent encounter: Secondary | ICD-10-CM | POA: Diagnosis not present

## 2017-06-18 MED ORDER — VANCOMYCIN HCL IN DEXTROSE 1-5 GM/200ML-% IV SOLN
1000.0000 mg | INTRAVENOUS | Status: AC
  Administered 2017-06-19: 1000 mg via INTRAVENOUS
  Filled 2017-06-18: qty 200

## 2017-06-18 NOTE — Pre-Procedure Instructions (Addendum)
    Deborah Hendricks  06/18/2017     Your procedure is scheduled on Wednesday, August 8.  Report to [facility] Admitting at 6:30 AM                Your surgery or procedure is scheduled for 8:30 AM   Call this number if you have problems the morning of surgery: [phone]   Remember:  Do not eat food or drink liquids after midnight.  Take these medicines the morning of surgery with A SIP OF WATER: Aspirin, atorvastatin (LIPITOR), carvedilol (COREG), citalopram (CELEXA), esomeprazole ([place]).                 May take Zofran if needed. Eliquis as instructed by Dr Trula Slade.                   Day of surgery:  Check CBG at  0500  o Check your blood sugar the morning of your surgery when you wake up and every 2 hours until you get to the Short Stay unit. . If  blood sugar is less than 70 mg/dL, you will need to treat for low blood sugar: o Do not take insulin. o Treat a low blood sugar (less than 70 mg/dL) 4 glucose tablets, OR glucose gel. o Recheck blood sugar in 15 minutes after treatment (to make sure it is greater than 70 mg/dL). If your blood sugar is not greater than 70 mg/dL on recheck, call [phone] for further instructions. . Report  blood sugar to the short stay nurse when you get to Short Stay.  If  CBG is greater than 220 mg/dL, you may take  of your sliding scale (correction) dose of insulin.  Per orders of Dr Belinda Block

## 2017-06-19 ENCOUNTER — Encounter (HOSPITAL_COMMUNITY): Payer: Self-pay

## 2017-06-19 ENCOUNTER — Observation Stay (HOSPITAL_COMMUNITY)
Admission: RE | Admit: 2017-06-19 | Discharge: 2017-06-21 | Disposition: A | Discharge status: Other Acute Inpatient Hospital | Payer: Medicare Other | Source: Ambulatory Visit | Attending: Surgery | Admitting: Surgery

## 2017-06-19 ENCOUNTER — Ambulatory Visit (HOSPITAL_COMMUNITY): Payer: Medicare Other | Admitting: Certified Registered Nurse Anesthetist

## 2017-06-19 ENCOUNTER — Telehealth: Payer: Self-pay | Admitting: Surgery

## 2017-06-19 ENCOUNTER — Encounter (HOSPITAL_COMMUNITY)
Admission: RE | Disposition: A | Discharge status: Other Acute Inpatient Hospital | Payer: Self-pay | Source: Ambulatory Visit | Attending: Surgery

## 2017-06-19 DIAGNOSIS — I132 Hypertensive heart and chronic kidney disease with heart failure and with stage 5 chronic kidney disease, or end stage renal disease: Secondary | ICD-10-CM | POA: Insufficient documentation

## 2017-06-19 DIAGNOSIS — Z992 Dependence on renal dialysis: Secondary | ICD-10-CM | POA: Diagnosis not present

## 2017-06-19 DIAGNOSIS — Z91040 Latex allergy status: Secondary | ICD-10-CM | POA: Insufficient documentation

## 2017-06-19 DIAGNOSIS — F1921 Other psychoactive substance dependence, in remission: Secondary | ICD-10-CM | POA: Insufficient documentation

## 2017-06-19 DIAGNOSIS — K219 Gastro-esophageal reflux disease without esophagitis: Secondary | ICD-10-CM | POA: Diagnosis not present

## 2017-06-19 DIAGNOSIS — D649 Anemia, unspecified: Secondary | ICD-10-CM | POA: Insufficient documentation

## 2017-06-19 DIAGNOSIS — I10 Essential (primary) hypertension: Secondary | ICD-10-CM | POA: Insufficient documentation

## 2017-06-19 DIAGNOSIS — E785 Hyperlipidemia, unspecified: Secondary | ICD-10-CM | POA: Insufficient documentation

## 2017-06-19 DIAGNOSIS — E119 Type 2 diabetes mellitus without complications: Secondary | ICD-10-CM | POA: Insufficient documentation

## 2017-06-19 DIAGNOSIS — I739 Peripheral vascular disease, unspecified: Secondary | ICD-10-CM | POA: Diagnosis not present

## 2017-06-19 DIAGNOSIS — Z7982 Long term (current) use of aspirin: Secondary | ICD-10-CM | POA: Insufficient documentation

## 2017-06-19 DIAGNOSIS — I70235 Atherosclerosis of native arteries of right leg with ulceration of other part of foot: Principal | ICD-10-CM | POA: Insufficient documentation

## 2017-06-19 DIAGNOSIS — I120 Hypertensive chronic kidney disease with stage 5 chronic kidney disease or end stage renal disease: Secondary | ICD-10-CM | POA: Diagnosis not present

## 2017-06-19 DIAGNOSIS — Z89612 Acquired absence of left leg above knee: Secondary | ICD-10-CM | POA: Insufficient documentation

## 2017-06-19 DIAGNOSIS — G4730 Sleep apnea, unspecified: Secondary | ICD-10-CM | POA: Diagnosis not present

## 2017-06-19 DIAGNOSIS — N186 End stage renal disease: Secondary | ICD-10-CM | POA: Diagnosis not present

## 2017-06-19 DIAGNOSIS — I509 Heart failure, unspecified: Secondary | ICD-10-CM | POA: Diagnosis not present

## 2017-06-19 DIAGNOSIS — D631 Anemia in chronic kidney disease: Secondary | ICD-10-CM | POA: Diagnosis not present

## 2017-06-19 DIAGNOSIS — Z794 Long term (current) use of insulin: Secondary | ICD-10-CM | POA: Insufficient documentation

## 2017-06-19 DIAGNOSIS — Z79899 Other long term (current) drug therapy: Secondary | ICD-10-CM | POA: Insufficient documentation

## 2017-06-19 DIAGNOSIS — F329 Major depressive disorder, single episode, unspecified: Secondary | ICD-10-CM | POA: Diagnosis not present

## 2017-06-19 DIAGNOSIS — E1122 Type 2 diabetes mellitus with diabetic chronic kidney disease: Secondary | ICD-10-CM | POA: Diagnosis not present

## 2017-06-19 DIAGNOSIS — Z880 Allergy status to penicillin: Secondary | ICD-10-CM | POA: Insufficient documentation

## 2017-06-19 DIAGNOSIS — I70291 Other atherosclerosis of native arteries of extremities, right leg: Secondary | ICD-10-CM | POA: Insufficient documentation

## 2017-06-19 DIAGNOSIS — Z882 Allergy status to sulfonamides status: Secondary | ICD-10-CM | POA: Insufficient documentation

## 2017-06-19 DIAGNOSIS — L97519 Non-pressure chronic ulcer of other part of right foot with unspecified severity: Secondary | ICD-10-CM | POA: Insufficient documentation

## 2017-06-19 DIAGNOSIS — Z87891 Personal history of nicotine dependence: Secondary | ICD-10-CM | POA: Diagnosis not present

## 2017-06-19 DIAGNOSIS — R569 Unspecified convulsions: Secondary | ICD-10-CM | POA: Diagnosis not present

## 2017-06-19 DIAGNOSIS — Z881 Allergy status to other antibiotic agents status: Secondary | ICD-10-CM | POA: Insufficient documentation

## 2017-06-19 DIAGNOSIS — Z9119 Patient's noncompliance with other medical treatment and regimen: Secondary | ICD-10-CM | POA: Insufficient documentation

## 2017-06-19 DIAGNOSIS — F419 Anxiety disorder, unspecified: Secondary | ICD-10-CM | POA: Insufficient documentation

## 2017-06-19 DIAGNOSIS — N2581 Secondary hyperparathyroidism of renal origin: Secondary | ICD-10-CM | POA: Diagnosis not present

## 2017-06-19 HISTORY — PX: AMPUTATION: SHX166

## 2017-06-19 LAB — GLUCOSE, CAPILLARY
Glucose-Capillary: 154 mg/dL — ABNORMAL HIGH (ref 65–99)
Glucose-Capillary: 182 mg/dL — ABNORMAL HIGH (ref 65–99)
Glucose-Capillary: 297 mg/dL — ABNORMAL HIGH (ref 65–99)

## 2017-06-19 LAB — COMPREHENSIVE METABOLIC PANEL
ALBUMIN: 2.9 g/dL — AB (ref 3.5–5.0)
ALK PHOS: 106 U/L (ref 38–126)
ALT: 15 U/L (ref 14–54)
AST: 17 U/L (ref 15–41)
Anion gap: 8 (ref 5–15)
BUN: 21 mg/dL — ABNORMAL HIGH (ref 6–20)
CALCIUM: 8.1 mg/dL — AB (ref 8.9–10.3)
CO2: 26 mmol/L (ref 22–32)
Chloride: 100 mmol/L — ABNORMAL LOW (ref 101–111)
Creatinine, Ser: 4.78 mg/dL — ABNORMAL HIGH (ref 0.44–1.00)
GFR calc Af Amer: 10 mL/min — ABNORMAL LOW (ref >60)
GFR calc non Af Amer: 8 mL/min — ABNORMAL LOW (ref >60)
GLUCOSE: 147 mg/dL — AB (ref 65–99)
POTASSIUM: 3.5 mmol/L (ref 3.5–5.1)
SODIUM: 134 mmol/L — AB (ref 135–145)
Total Bilirubin: 0.3 mg/dL (ref 0.3–1.2)
Total Protein: 5.9 g/dL — ABNORMAL LOW (ref 6.5–8.1)

## 2017-06-19 LAB — POCT I-STAT 4, (NA,K, GLUC, HGB,HCT)
GLUCOSE: 132 mg/dL — AB (ref 65–99)
HEMATOCRIT: 26 % — AB (ref 36.0–46.0)
HEMOGLOBIN: 8.8 g/dL — AB (ref 12.0–15.0)
POTASSIUM: 3.5 mmol/L (ref 3.5–5.1)
SODIUM: 137 mmol/L (ref 135–145)

## 2017-06-19 LAB — NO BLOOD PRODUCTS

## 2017-06-19 SURGERY — AMPUTATION DIGIT
Anesthesia: General | Site: Toe | Laterality: Right

## 2017-06-19 MED ORDER — CALCIUM ACETATE (PHOS BINDER) 667 MG PO CAPS
2001.0000 mg | ORAL_CAPSULE | Freq: Three times a day (TID) | ORAL | Status: DC
  Administered 2017-06-19 – 2017-06-21 (×4): 2001 mg via ORAL
  Filled 2017-06-19 (×4): qty 3

## 2017-06-19 MED ORDER — SODIUM CHLORIDE 0.9 % IV SOLN: 100.0000 mL | INTRAVENOUS | Status: DC | PRN

## 2017-06-19 MED ORDER — NEPRO/CARBSTEADY PO LIQD
237.0000 mL | Freq: Every day | ORAL | Status: DC
  Administered 2017-06-19: 237 mL via ORAL

## 2017-06-19 MED ORDER — PANTOPRAZOLE SODIUM 40 MG PO TBEC
40.0000 mg | DELAYED_RELEASE_TABLET | Freq: Every day | ORAL | Status: DC
  Administered 2017-06-20 – 2017-06-21 (×2): 40 mg via ORAL
  Filled 2017-06-19 (×2): qty 1

## 2017-06-19 MED ORDER — HYDRALAZINE HCL 20 MG/ML IJ SOLN: 5.0000 mg | INTRAMUSCULAR | Status: DC | PRN

## 2017-06-19 MED ORDER — ONDANSETRON HCL 4 MG/2ML IJ SOLN: 4.0000 mg | Freq: Once | INTRAMUSCULAR | Status: DC | PRN

## 2017-06-19 MED ORDER — LABETALOL HCL 5 MG/ML IV SOLN
INTRAVENOUS | Status: AC
  Filled 2017-06-19: qty 4

## 2017-06-19 MED ORDER — RENA-VITE PO TABS
1.0000 | ORAL_TABLET | Freq: Every day | ORAL | Status: DC
  Administered 2017-06-19 – 2017-06-21 (×3): 1 via ORAL
  Filled 2017-06-19 (×3): qty 1

## 2017-06-19 MED ORDER — PHENOL 1.4 % MT LIQD: 1.0000 | OROMUCOSAL | Status: DC | PRN

## 2017-06-19 MED ORDER — LABETALOL HCL 5 MG/ML IV SOLN
10.0000 mg | INTRAVENOUS | Status: DC | PRN
  Administered 2017-06-19 (×2): 10 mg via INTRAVENOUS
  Filled 2017-06-19: qty 4

## 2017-06-19 MED ORDER — INSULIN ASPART 100 UNIT/ML ~~LOC~~ SOLN
2.0000 [IU] | Freq: Three times a day (TID) | SUBCUTANEOUS | Status: DC | PRN
  Administered 2017-06-20: 2 [IU] via SUBCUTANEOUS
  Filled 2017-06-19: qty 0.1

## 2017-06-19 MED ORDER — HEPARIN SODIUM (PORCINE) 1000 UNIT/ML IJ SOLN
INTRAMUSCULAR | Status: AC
  Filled 2017-06-19: qty 2

## 2017-06-19 MED ORDER — PROPOFOL 10 MG/ML IV BOLUS
INTRAVENOUS | Status: DC | PRN
  Administered 2017-06-19: 80 mg via INTRAVENOUS

## 2017-06-19 MED ORDER — LIDOCAINE HCL (PF) 1 % IJ SOLN
INTRAMUSCULAR | Status: AC
  Filled 2017-06-19: qty 30

## 2017-06-19 MED ORDER — GUAIFENESIN-DM 100-10 MG/5ML PO SYRP: 15.0000 mL | ORAL_SOLUTION | ORAL | Status: DC | PRN

## 2017-06-19 MED ORDER — 0.9 % SODIUM CHLORIDE (POUR BTL) OPTIME
TOPICAL | Status: DC | PRN
  Administered 2017-06-19: 1000 mL

## 2017-06-19 MED ORDER — POLYETHYLENE GLYCOL 3350 17 GM/SCOOP PO POWD
17.0000 g | Freq: Every day | ORAL | Status: DC
  Filled 2017-06-19: qty 255

## 2017-06-19 MED ORDER — SODIUM CHLORIDE 0.9 % IV SOLN
INTRAVENOUS | Status: DC
  Administered 2017-06-19: 08:00:00 via INTRAVENOUS

## 2017-06-19 MED ORDER — PHENYLEPHRINE 40 MCG/ML (10ML) SYRINGE FOR IV PUSH (FOR BLOOD PRESSURE SUPPORT)
PREFILLED_SYRINGE | INTRAVENOUS | Status: AC
  Filled 2017-06-19: qty 10

## 2017-06-19 MED ORDER — CITALOPRAM HYDROBROMIDE 10 MG PO TABS
10.0000 mg | ORAL_TABLET | Freq: Every day | ORAL | Status: DC
  Administered 2017-06-19 – 2017-06-21 (×3): 10 mg via ORAL
  Filled 2017-06-19 (×3): qty 1

## 2017-06-19 MED ORDER — PROTAMINE SULFATE 10 MG/ML IV SOLN
INTRAVENOUS | Status: AC
  Filled 2017-06-19: qty 10

## 2017-06-19 MED ORDER — CHLORHEXIDINE GLUCONATE CLOTH 2 % EX PADS: 6.0000 | MEDICATED_PAD | Freq: Once | CUTANEOUS | Status: DC

## 2017-06-19 MED ORDER — ONDANSETRON HCL 4 MG/2ML IJ SOLN
4.0000 mg | Freq: Four times a day (QID) | INTRAMUSCULAR | Status: DC | PRN
  Administered 2017-06-21: 4 mg via INTRAVENOUS

## 2017-06-19 MED ORDER — EPHEDRINE 5 MG/ML INJ
INTRAVENOUS | Status: AC
  Filled 2017-06-19: qty 10

## 2017-06-19 MED ORDER — METOPROLOL TARTRATE 5 MG/5ML IV SOLN: 2.0000 mg | INTRAVENOUS | Status: DC | PRN

## 2017-06-19 MED ORDER — FENTANYL CITRATE (PF) 100 MCG/2ML IJ SOLN
INTRAMUSCULAR | Status: DC | PRN
  Administered 2017-06-19: 50 ug via INTRAVENOUS

## 2017-06-19 MED ORDER — PENTAFLUOROPROP-TETRAFLUOROETH EX AERO: 1.0000 "application " | INHALATION_SPRAY | CUTANEOUS | Status: DC | PRN

## 2017-06-19 MED ORDER — HEPARIN SODIUM (PORCINE) 1000 UNIT/ML DIALYSIS: 1000.0000 [IU] | INTRAMUSCULAR | Status: DC | PRN

## 2017-06-19 MED ORDER — GABAPENTIN 100 MG PO CAPS
100.0000 mg | ORAL_CAPSULE | Freq: Three times a day (TID) | ORAL | Status: DC
  Administered 2017-06-19 – 2017-06-21 (×6): 100 mg via ORAL
  Filled 2017-06-19 (×6): qty 1

## 2017-06-19 MED ORDER — ONDANSETRON 4 MG PO TBDP
4.0000 mg | ORAL_TABLET | Freq: Three times a day (TID) | ORAL | Status: DC | PRN
  Filled 2017-06-19: qty 1

## 2017-06-19 MED ORDER — PANTOPRAZOLE SODIUM 40 MG PO TBEC: 40.0000 mg | DELAYED_RELEASE_TABLET | Freq: Every day | ORAL | Status: DC

## 2017-06-19 MED ORDER — LIDOCAINE 2% (20 MG/ML) 5 ML SYRINGE
INTRAMUSCULAR | Status: AC
  Filled 2017-06-19: qty 15

## 2017-06-19 MED ORDER — POTASSIUM CHLORIDE CRYS ER 20 MEQ PO TBCR: 20.0000 meq | EXTENDED_RELEASE_TABLET | Freq: Once | ORAL | Status: DC

## 2017-06-19 MED ORDER — MEPERIDINE HCL 25 MG/ML IJ SOLN: 6.2500 mg | INTRAMUSCULAR | Status: DC | PRN

## 2017-06-19 MED ORDER — ATORVASTATIN CALCIUM 40 MG PO TABS
40.0000 mg | ORAL_TABLET | Freq: Every day | ORAL | Status: DC
  Administered 2017-06-19 – 2017-06-21 (×3): 40 mg via ORAL
  Filled 2017-06-19 (×3): qty 1

## 2017-06-19 MED ORDER — LIDOCAINE-PRILOCAINE 2.5-2.5 % EX CREA: 1.0000 "application " | TOPICAL_CREAM | CUTANEOUS | Status: DC | PRN

## 2017-06-19 MED ORDER — ALUM & MAG HYDROXIDE-SIMETH 200-200-20 MG/5ML PO SUSP: 15.0000 mL | ORAL | Status: DC | PRN

## 2017-06-19 MED ORDER — FENTANYL CITRATE (PF) 250 MCG/5ML IJ SOLN
INTRAMUSCULAR | Status: AC
  Filled 2017-06-19: qty 5

## 2017-06-19 MED ORDER — ALTEPLASE 2 MG IJ SOLR: 2.0000 mg | Freq: Once | INTRAMUSCULAR | Status: DC | PRN

## 2017-06-19 MED ORDER — ACETAMINOPHEN-CODEINE #3 300-30 MG PO TABS
1.0000 | ORAL_TABLET | ORAL | Status: DC | PRN
  Administered 2017-06-19 – 2017-06-20 (×3): 1 via ORAL
  Filled 2017-06-19 (×3): qty 1

## 2017-06-19 MED ORDER — PRO-STAT SUGAR FREE PO LIQD
30.0000 mL | Freq: Two times a day (BID) | ORAL | Status: DC
  Administered 2017-06-19 – 2017-06-21 (×4): 30 mL via ORAL
  Filled 2017-06-19 (×4): qty 30

## 2017-06-19 MED ORDER — ASPIRIN EC 81 MG PO TBEC
81.0000 mg | DELAYED_RELEASE_TABLET | Freq: Every day | ORAL | Status: DC
  Administered 2017-06-19 – 2017-06-21 (×3): 81 mg via ORAL
  Filled 2017-06-19 (×3): qty 1

## 2017-06-19 MED ORDER — PROPOFOL 10 MG/ML IV BOLUS
INTRAVENOUS | Status: AC
  Filled 2017-06-19: qty 20

## 2017-06-19 MED ORDER — HYDROMORPHONE HCL 1 MG/ML IJ SOLN: 0.2500 mg | INTRAMUSCULAR | Status: DC | PRN

## 2017-06-19 MED ORDER — EPHEDRINE SULFATE 50 MG/ML IJ SOLN
INTRAMUSCULAR | Status: DC | PRN
  Administered 2017-06-19: 5 mg via INTRAVENOUS

## 2017-06-19 MED ORDER — LIDOCAINE HCL (CARDIAC) 20 MG/ML IV SOLN
INTRAVENOUS | Status: DC | PRN
  Administered 2017-06-19: 100 mg via INTRAVENOUS

## 2017-06-19 MED ORDER — CARVEDILOL 3.125 MG PO TABS
3.1250 mg | ORAL_TABLET | Freq: Two times a day (BID) | ORAL | Status: DC
  Administered 2017-06-19 – 2017-06-21 (×3): 3.125 mg via ORAL
  Filled 2017-06-19 (×3): qty 1

## 2017-06-19 MED ORDER — LIDOCAINE HCL (PF) 1 % IJ SOLN: 5.0000 mL | INTRAMUSCULAR | Status: DC | PRN

## 2017-06-19 MED ORDER — PSYLLIUM 95 % PO PACK: 1.0000 | PACK | Freq: Three times a day (TID) | ORAL | Status: DC | PRN

## 2017-06-19 SURGICAL SUPPLY — 32 items
BANDAGE ACE 4X5 VEL STRL LF (GAUZE/BANDAGES/DRESSINGS) ×3 IMPLANT
BLADE AVERAGE 25MMX9MM (BLADE)
BLADE AVERAGE 25X9 (BLADE) IMPLANT
BLADE SAW SGTL 81X20 HD (BLADE) IMPLANT
BNDG GAUZE ELAST 4 BULKY (GAUZE/BANDAGES/DRESSINGS) ×3 IMPLANT
CANISTER SUCT 3000ML PPV (MISCELLANEOUS) ×3 IMPLANT
COVER SURGICAL LIGHT HANDLE (MISCELLANEOUS) ×3 IMPLANT
DRAPE EXTREMITY T 121X128X90 (DRAPE) ×3 IMPLANT
DRAPE HALF SHEET 40X57 (DRAPES) ×3 IMPLANT
ELECT REM PT RETURN 9FT ADLT (ELECTROSURGICAL) ×3
ELECTRODE REM PT RTRN 9FT ADLT (ELECTROSURGICAL) ×1 IMPLANT
GAUZE SPONGE 4X4 12PLY STRL (GAUZE/BANDAGES/DRESSINGS) ×3 IMPLANT
GLOVE BIOGEL PI IND STRL 6.5 (GLOVE) ×1 IMPLANT
GLOVE BIOGEL PI IND STRL 7.5 (GLOVE) ×3 IMPLANT
GLOVE BIOGEL PI INDICATOR 6.5 (GLOVE) ×2
GLOVE BIOGEL PI INDICATOR 7.5 (GLOVE) ×6
GLOVE SURG SS PI 7.5 STRL IVOR (GLOVE) ×3 IMPLANT
GOWN STRL REUS W/ TWL LRG LVL3 (GOWN DISPOSABLE) IMPLANT
GOWN STRL REUS W/ TWL XL LVL3 (GOWN DISPOSABLE) ×2 IMPLANT
GOWN STRL REUS W/TWL LRG LVL3 (GOWN DISPOSABLE)
GOWN STRL REUS W/TWL XL LVL3 (GOWN DISPOSABLE) ×4
KIT BASIN OR (CUSTOM PROCEDURE TRAY) ×3 IMPLANT
KIT ROOM TURNOVER OR (KITS) ×3 IMPLANT
NEEDLE HYPO 25GX1X1/2 BEV (NEEDLE) IMPLANT
NS IRRIG 1000ML POUR BTL (IV SOLUTION) ×3 IMPLANT
PACK GENERAL/GYN (CUSTOM PROCEDURE TRAY) ×3 IMPLANT
PAD ARMBOARD 7.5X6 YLW CONV (MISCELLANEOUS) ×6 IMPLANT
SUT ETHILON 3 0 PS 1 (SUTURE) ×6 IMPLANT
SYR CONTROL 10ML LL (SYRINGE) IMPLANT
TOWEL GREEN STERILE (TOWEL DISPOSABLE) ×3 IMPLANT
UNDERPAD 30X30 (UNDERPADS AND DIAPERS) ×3 IMPLANT
WATER STERILE IRR 1000ML POUR (IV SOLUTION) IMPLANT

## 2017-06-19 NOTE — Interval H&P Note (Signed)
History and Physical Interval Note:  06/19/2017 8:18 AM  Deborah Hendricks  has presented today for surgery, with the diagnosis of Peripheral Vascular Disease with Right Great Toe Amputation  I70.235  The various methods of treatment have been discussed with the patient and family. After consideration of risks, benefits and other options for treatment, the patient has consented to  Procedure(s): AMPUTATION DIGIT (Right) as a surgical intervention .  The patient's history has been reviewed, patient examined, no change in status, stable for surgery.  I have reviewed the patient's chart and labs.  Questions were answered to the patient's satisfaction.     [name]

## 2017-06-19 NOTE — H&P (View-Only) (Signed)
[facility] [place]  Patient name: Deborah Hendricks MRN: [id number] DOB: Nov 22, 1945 Sex: female   REQUESTING PROVIDER:    Hospital service   REASON FOR CONSULT:    Right great toe ulcer  HISTORY OF PRESENT ILLNESS:   Deborah Hendricks a 71 y.o.femalewho returns today for evaluation of a right leg ulcer. This has been present for many weeks. She is status post left above-knee amputation on 12/15/2015. She also suffers from end-stage renal disease. She has undergone a basilic vein procedure which has not developed. She has a catheter in her right thigh. He has a history of stroke. She is on anticoagulation. She suffers from diabetes with multiple complications.  On 11/27/2016 she underwent atherectomy and drug coated balloon angioplasty of the superficial femoral and popliteal artery on the right. She also had balloon angioplasty of the stenosis within the tibioperoneal trunk and proximal peroneal artery. She has single-vessel runoff via the peroneal artery.  The patient was scheduled to undergo angiography 3 days ago however, she did not show up.  She has elevated velocities within the proximal and mid superficial femoral arteries.  This had previously been treated with a 1.5 CSI classic device and a 4 mm drug coated balloon.  The tibioperoneal trunk was treated with primary balloon angioplasty using a 3 mm balloon.  PAST MEDICAL HISTORY    Past Medical History:  Diagnosis Date  . Anemia   . Anxiety   . CHF (congestive heart failure) ([place])   . Constipation   . Critical lower limb ischemia   . Depression   . ESRD on dialysis [facility])    "TTS; [place]" (11/27/2016)  . GERD (gastroesophageal reflux disease)   . Heart murmur   . HLD (hyperlipidemia)   . Hypertension   . Memory loss   . Peripheral vascular disease ([place])   . Refusal of blood transfusions as patient is Jehovah's Witness   . Seizures ([place]) 04/2015   s/p CVA.  [name] Sleep apnea    Noncompliant with CPAP.  [name] Stroke ([place]) 2015  . Type II diabetes mellitus ([place])      FAMILY HISTORY   Family History  Problem Relation Age of Onset  . Asthma Mother   . Cancer Sister     SOCIAL HISTORY:   Social History   Social History  . Marital status: Single    Spouse name: N/A  . Number of children: N/A  . Years of education: N/A   Occupational History  . Not on file.   Social History Main Topics  . Smoking status: Former Smoker    Packs/day: 0.25    Years: 51.00    Types: Cigarettes    Quit date: 04/13/2015  . Smokeless tobacco: Never Used  . Alcohol use 0.0 oz/week     Comment: 11/27/2016 "stopped in 04/2015"  . Drug use: No  . Sexual activity: No   Other Topics Concern  . Not on file   Social History Narrative  . No narrative on file    ALLERGIES:    Allergies  Allergen Reactions  . Penicillins Hives, Itching, Rash and Other (See Comments)    Has patient had a PCN reaction causing immediate rash, facial/tongue/throat swelling, SOB or lightheadedness with hypotension: Yes Has patient had a PCN reaction causing severe rash involving mucus membranes or skin necrosis:unknown MAR source Has patient had a PCN reaction that required hospitalization:unknown MAR source Has patient had a PCN reaction occurring within the last 10 years:unknown  MAR source If all of the above answers are "NO", then may proceed with Cephalosporin use.  [name] Amoxicillin Hives, Itching and Rash    Has patient had a PCN reaction causing immediate rash, facial/tongue/throat swelling, SOB or lightheadedness with hypotension:unknown MAR source Has patient had a PCN reaction causing severe rash involving mucus membranes or skin necrosis:unknown MAR source Has patient had a PCN reaction that required hospitalization:unknown MAR source Has patient had a PCN reaction occurring within the last 10 years: unknown MAR source If all of the above answers are "NO", then  may proceed with Cephalosporin use.   . Latex Rash  . Sulfa Antibiotics Other (See Comments)    Unknown     CURRENT MEDICATIONS:    Current Facility-Administered Medications  Medication Dose Route Frequency Provider Last Rate Last Dose  . 0.9 %  sodium chloride infusion  250 mL Intravenous PRN Serafina Mitchell, MD      . acetaminophen (TYLENOL) tablet 650 mg  650 mg Oral Q6H PRN Ivor Costa, MD   650 mg at 06/06/17 0334   Or  . acetaminophen (TYLENOL) suppository 650 mg  650 mg Rectal Q6H PRN Ivor Costa, MD      . acidophilus (RISAQUAD) capsule 1 capsule  1 capsule Oral TID Ivor Costa, MD   1 capsule at 06/06/17 2125  . atorvastatin (LIPITOR) tablet 40 mg  40 mg Oral Daily Ivor Costa, MD   40 mg at 06/06/17 0917  . calcium acetate (PHOSLO) capsule 1,334 mg  1,334 mg Oral TID WC Ivor Costa, MD   1,334 mg at 06/07/17 0803  . carvedilol (COREG) tablet 3.125 mg  3.125 mg Oral BID WC Ivor Costa, MD   3.125 mg at 06/07/17 0834  . citalopram (CELEXA) tablet 10 mg  10 mg Oral Q24H Ivor Costa, MD   10 mg at 06/07/17 0834  . dextrose 50 % solution 50 mL  50 mL Intravenous PRN Ivor Costa, MD      . doxercalciferol (HECTOROL) injection 1 mcg  1 mcg Intravenous Q T,Th,Sa-HD Lynnda Child, PA-C   1 mcg at 06/06/17 1627  . feeding supplement (NEPRO CARB STEADY) liquid 237 mL  237 mL Oral Q24H Ivor Costa, MD   237 mL at 06/06/17 0918  . feeding supplement (PRO-STAT SUGAR FREE 64) liquid 30 mL  30 mL Oral BID Ivor Costa, MD   30 mL at 06/06/17 0917  . gabapentin (NEURONTIN) capsule 100 mg  100 mg Oral QHS Ivor Costa, MD   100 mg at 06/06/17 2125  . hydrALAZINE (APRESOLINE) injection 5 mg  5 mg Intravenous Q2H PRN Ivor Costa, MD      . insulin aspart (novoLOG) injection 0-5 Units  0-5 Units Subcutaneous QHS Ivor Costa, MD      . insulin aspart (novoLOG) injection 0-9 Units  0-9 Units Subcutaneous TID WC Ivor Costa, MD   1 Units at 06/06/17 1818  . multivitamin (RENA-VIT) tablet 1 tablet  1 tablet  Oral Daily Ivor Costa, MD   1 tablet at 06/06/17 [phone]  . ondansetron (ZOFRAN) injection 4 mg  4 mg Intravenous Q8H PRN Ivor Costa, MD      . ondansetron (ZOFRAN-ODT) disintegrating tablet 4 mg  4 mg Oral Q8H PRN Ivor Costa, MD      . pantoprazole (PROTONIX) EC tablet 40 mg  40 mg Oral Daily Ivor Costa, MD   40 mg at 06/06/17 7253  . polycarbophil (FIBERCON) tablet 625 mg  625 mg Oral TID  PRN Ivor Costa, MD      . polyethylene glycol [facility] / GLYCOLAX) packet 17 g  17 g Oral Daily Ivor Costa, MD   17 g at 06/06/17 7209  . sodium chloride flush (NS) 0.9 % injection 3 mL  3 mL Intravenous Q12H Serafina Mitchell, MD   3 mL at 06/06/17 2128  . sodium chloride flush (NS) 0.9 % injection 3 mL  3 mL Intravenous PRN Serafina Mitchell, MD      . zolpidem (AMBIEN) tablet 5 mg  5 mg Oral QHS PRN Ivor Costa, MD        REVIEW OF SYSTEMS:   [X]  denotes positive finding, [ ]  denotes negative finding Cardiac  Comments:  Chest pain or chest pressure:    Shortness of breath upon exertion:    Short of breath when lying flat:    Irregular heart rhythm:        Vascular    Pain in calf, thigh, or hip brought on by ambulation:    Pain in feet at night that wakes you up from your sleep:     Blood clot in your veins:    Leg swelling:         Pulmonary    Oxygen at home:    Productive cough:     Wheezing:         Neurologic    Sudden weakness in arms or legs:     Sudden numbness in arms or legs:     Sudden onset of difficulty speaking or slurred speech:    Temporary loss of vision in one eye:     Problems with dizziness:         Gastrointestinal    Blood in stool:      Vomited blood:         Genitourinary    Burning when urinating:     Blood in urine:        Psychiatric    Major depression:         Hematologic    Bleeding problems: x   Problems with blood clotting too easily:        Skin    Rashes or ulcers: x       Constitutional    Fever or chills:     PHYSICAL EXAM:   Vitals:    06/06/17 1715 06/06/17 1753 06/06/17 2026 06/07/17 0423  BP: 111/70 (!) 128/43 (!) 136/59 (!) 92/49  Pulse: 82 86 90 88  Resp: 17 18 18 18   Temp: 98.2 F (36.8 C) 98.3 F (36.8 C) 98.4 F (36.9 C) 98.7 F (37.1 C)  TempSrc: Oral Oral Oral Oral  SpO2: 100% 100% 100% 100%  Weight: 148 lb 9.4 oz (67.4 kg)  152 lb (68.9 kg)   Height:        GENERAL: The patient is a well-nourished female, in no acute distress. The vital signs are documented above. CARDIAC: There is a regular rate and rhythm.  VASCULAR: Nonpalpable pedal pulses PULMONARY: Nonlabored respirations ABDOMEN: Soft and non-tender with normal pitched bowel sounds.  MUSCULOSKELETAL: Surgically absent left leg and toes right 1 through 4  NEUROLOGIC: No focal weakness or paresthesias are detected. SKIN: There are no ulcers or rashes noted. PSYCHIATRIC: The patient has a normal affect.  STUDIES:   None  ASSESSMENT and PLAN   Chronic ulcer to the right great toe: The patient was originally scheduled to undergo angiography to address stenosis within a previously treated segment.  I wanted to correct her stenosis prior to toe amputation so as to give this the best possible chance to heal.  She did not show up for her arteriogram.  She is now admitted because of bleeding from a dialysis catheter.  The wound remains stable without signs of systemic toxicity.  I discussed with the patient that I will plan on placing her back on the schedule for angiography this coming Wednesday.  She will need to again be off of her colic was.  If successful revascularization is performed, I can schedule her for a toe amputation the following day.  She can be discharged and have this done as an outpatient.   [name], MD [facility] [phone] Pager [phone]

## 2017-06-19 NOTE — Op Note (Signed)
    Patient name: Deborah Hendricks MRN: [id number] DOB: [birth year] Sex: female  06/19/2017 Pre-operative Diagnosis: Infected right great toe Post-operative diagnosis:  Same Surgeon:  [name] Assistants:  None Procedure:   Right great toe amputation with metatarsal head Anesthesia:  Gen. Blood Loss:  See anesthesia record Specimens:  Right toe  Findings:  Adequate petechial bleeding  Indications:  The patient is previously undergone amputation of toes 2 through 5.  Her great toe has now become infected with an ulcer.  She is undergone angiography and percutaneous intervention to optimize her blood flow.  She is here today for toe removal  Procedure:  The patient was identified in the holding area and taken to [place] [facility]  The patient was then placed supine on the table. general anesthesia was administered.  The patient was prepped and draped in the usual sterile fashion.  A time out was called and antibiotics were administered.  An elliptical incision was made at the base of the right great toe with a 10 blade carrying the incision down to the bone.  Large bone cutters were then used to transect the bone.  Ron jeweler's were used to debris back bone that was not healthy to the metatarsal head which was also removed.  There appeared to be adequate petechial bleeding.  The wound was then irrigated.  3 2-0 nylon sutures were placed and the wound was packed with a 4 x 4 gauze.  Sterile dressings were applied   Disposition:  To PACU stable   V. [name], M.D. [facility] [place] Office: [phone] Pager:  [phone]

## 2017-06-19 NOTE — Consult Note (Signed)
[facility] Renal Consultation Note    ** Note started 8/8 at 2:34pm, unable to be completed until this morning due to computers being down.  Indication for Consultation:  Management of ESRD/hemodialysis, anemia, hypertension/volume, and secondary hyperparathyroidism. PCP:  HPI: Deborah Hendricks is a 71 y.o. [name], Type 2 DM, HTN, Hx CVA, dementia, PVD (s/p L AKA), and Jehovah's witness status who was admitted following R great toe amputation 06/19/17.   Known PVD with Hx L AKA and R 2nd-5th toe amputations. Developed R great toe ulceration which is now infected. S/p underwent abdominal aortogram with RLE run-off last week. Currently denies CP, dyspnea, fever, chills, N/V, or diarrhea.  Usually dialyzes TTS at [facility], last HD was 8/7. Uses tunneled cath as access, no recent dialysis issues.  Past Medical History:  Diagnosis Date  . Anemia   . Anxiety   . CHF (congestive heart failure) ([place])   . Constipation   . Critical lower limb ischemia   . Depression   . ESRD on dialysis [facility])    "TTS; [place]" (11/27/2016)  . GERD (gastroesophageal reflux disease)   . Heart murmur   . HLD (hyperlipidemia)   . Hypertension   . Memory loss   . Peripheral vascular disease ([place])   . Refusal of blood transfusions as patient is Jehovah's Witness   . Seizures ([place]) 04/2015   s/p CVA.  [name] Sleep apnea    Noncompliant with CPAP.  [name] Stroke ([place]) 2015  . Type II diabetes mellitus ([place])    Past Surgical History:  Procedure Laterality Date  . ABDOMINAL AORTOGRAM W/LOWER EXTREMITY Right 06/13/2017   Procedure: Abdominal Aortogram w/Lower Extremity;  Surgeon: Waynetta Sandy, MD;  Location: [place] [facility];  Service: Cardiovascular;  Laterality: Right;  . ABDOMINAL HYSTERECTOMY    . AMPUTATION Left 12/15/2015   Procedure: LEFT ABOVE KNEE AMPUTATION;  Surgeon: Serafina Mitchell, MD;  Location: [place];  Service: Vascular;  Laterality: Left;  . BASCILIC VEIN TRANSPOSITION  Right 11/25/2015   Procedure: Right Arm FIRST STAGE BASILIC VEIN TRANSPOSITION;  Surgeon: Serafina Mitchell, MD;  Location: [place];  Service: Vascular;  Laterality: Right;  . BASCILIC VEIN TRANSPOSITION Right 01/19/2016   Procedure: RIGHT SECOND STAGE BASILIC VEIN TRANSPOSITION;  Surgeon: Serafina Mitchell, MD;  Location: [place];  Service: Vascular;  Laterality: Right;  . DILATION AND CURETTAGE OF UTERUS    . EYE SURGERY    . IR FLUORO GUIDE CV LINE RIGHT  06/05/2017  . PERIPHERAL VASCULAR BALLOON ANGIOPLASTY Right 06/13/2017   Procedure: PERIPHERAL VASCULAR BALLOON ANGIOPLASTY;  Surgeon: Waynetta Sandy, MD;  Location: [place] [facility];  Service: Cardiovascular;  Laterality: Right;  POPLITEAL/SFA/PERONEAL  . PERIPHERAL VASCULAR CATHETERIZATION Bilateral 11/28/2015   Procedure: Lower Extremity Angiography;  Surgeon: Lorretta Harp, MD;  Location: [place] [facility];  Service: Cardiovascular;  Laterality: Bilateral;  . PERIPHERAL VASCULAR CATHETERIZATION N/A 11/28/2015   Procedure: Abdominal Aortogram;  Surgeon: Lorretta Harp, MD;  Location: [address] [facility];  Service: Cardiovascular;  Laterality: N/A;  . PERIPHERAL VASCULAR CATHETERIZATION Right 11/27/2016  . PERIPHERAL VASCULAR CATHETERIZATION N/A 11/27/2016   Procedure: Abdominal Aortogram w/Lower Extremity;  Surgeon: Serafina Mitchell, MD;  Location: [place] [facility];  Service: Cardiovascular;  Laterality: N/A;  . PERIPHERAL VASCULAR CATHETERIZATION N/A 11/27/2016   Procedure: Right arm and central venogram;  Surgeon: Serafina Mitchell, MD;  Location: [place] [facility];  Service: Cardiovascular;  Laterality: N/A;  .  PERIPHERAL VASCULAR CATHETERIZATION Right 11/27/2016   Procedure: Peripheral Vascular Atherectomy;  Surgeon: Serafina Mitchell, MD;  Location: [place] [facility];  Service: Cardiovascular;  Laterality: Right;  Rt    sfa, popliteal, tibioperoneal trunk, peroneal  . PERIPHERAL VASCULAR CATHETERIZATION Right 11/27/2016   Procedure:  Peripheral Vascular Balloon Angioplasty;  Surgeon: Serafina Mitchell, MD;  Location: [place] [facility];  Service: Cardiovascular;  Laterality: Right;  Tibial peroneal  . RETINOPATHY SURGERY Bilateral   . TOE AMPUTATION Right    2nd, 3rd, 4th, 5th digits  . TONSILLECTOMY     Family History  Problem Relation Age of Onset  . Asthma Mother   . Cancer Sister    Social History:  reports that she quit smoking about 2 years ago. Her smoking use included Cigarettes. She has a 12.75 pack-year smoking history. She has never used smokeless tobacco. She reports that she drinks alcohol. She reports that she does not use drugs.  ROS: As per HPI otherwise negative.  Physical Exam: Vitals:   06/19/17 1300 06/19/17 1315 06/19/17 1330 06/19/17 1400  BP:      Pulse: 69 69 69   Resp: 10 19 14    Temp:   97.7 F (36.5 C) (!) 97.5 F (36.4 C)  TempSrc:      SpO2: 100% 100% 100%   Weight:      Height:         General: Well developed, well nourished, in no acute distress. Head: Normocephalic, atraumatic, sclera non-icteric, mucus membranes are moist. Neck: Supple without lymphadenopathy/masses. JVD not elevated. Lungs: Clear bilaterally to auscultation without wheezes, rales, or rhonchi. Breathing is unlabored. Heart: RRR with normal S1, S2. No murmurs, rubs, or gallops appreciated. Abdomen: Soft, non-tender, non-distended with normoactive bowel sounds.  Lower extremities: L AKA without stump edema. Bandaged R foot; no edema. Neuro: Alert and oriented X 3. Moves all extremities spontaneously. Psych:  Responds to questions appropriately with a normal affect. Dialysis Access: TDC in chest; no erythema.  Allergies  Allergen Reactions  . Penicillins Hives, Itching, Rash and Other (See Comments)    Has patient had a PCN reaction causing immediate rash, facial/tongue/throat swelling, SOB or lightheadedness with hypotension: Yes Has patient had a PCN reaction causing severe rash involving mucus membranes  or skin necrosis:unknown MAR source Has patient had a PCN reaction that required hospitalization:unknown MAR source Has patient had a PCN reaction occurring within the last 10 years:unknown MAR source If all of the above answers are "NO", then may proceed with Cephalosporin use.  . Sulfa Antibiotics     UNSPECIFIED REACTION   . Amoxicillin Hives, Itching and Rash    Has patient had a PCN reaction causing immediate rash, facial/tongue/throat swelling, SOB or lightheadedness with hypotension:unknown MAR source Has patient had a PCN reaction causing severe rash involving mucus membranes or skin necrosis:unknown MAR source Has patient had a PCN reaction that required hospitalization:unknown MAR source Has patient had a PCN reaction occurring within the last 10 years: unknown MAR source If all of the above answers are "NO", then may proceed with Cephalosporin use.   . Latex Rash   Prior to Admission medications   Medication Sig Start Date End Date Taking? Authorizing Provider  Amino Acids-Protein Hydrolys (FEEDING SUPPLEMENT, PRO-STAT SUGAR FREE 64,) LIQD Take 30 mLs by mouth 2 (two) times daily. (0900 & 2100) 06/14/17  Yes Alvia Grove, PA-C  aspirin EC 81 MG tablet Take 81 mg by mouth daily.   Yes [provider]  atorvastatin (LIPITOR) 40 MG tablet Take 1 tablet (40 mg total) by mouth daily. Patient taking differently: Take 40 mg by mouth daily. (0900) 03/07/17  Yes Briscoe Deutscher, DO  calcium acetate (PHOSLO) 667 MG capsule Take 2,001 mg by mouth 3 (three) times daily with meals.    Yes [provider]  carvedilol (COREG) 3.125 MG tablet Take 1 tablet (3.125 mg total) by mouth 2 (two) times daily with a meal. 07/30/16  Yes Reyne Dumas, MD  citalopram (CELEXA) 10 MG tablet Take 10 mg by mouth daily. (0900) 09/27/16  Yes [provider]  esomeprazole (NEXIUM) 20 MG capsule Take 1 capsule (20 mg total) by mouth daily. 03/11/17  Yes Briscoe Deutscher, DO  gabapentin  (NEURONTIN) 100 MG capsule Take 1 capsule (100 mg total) by mouth 3 (three) times daily. Patient taking differently: Take 100 mg by mouth at bedtime.  06/14/17  Yes Virgina Jock A, PA-C  insulin aspart (NOVOLOG) 100 UNIT/ML injection Inject 2-10 Units into the skin 3 (three) times daily as needed for high blood sugar. 151-200=2 units, 201-250=4 units, 251-300=6 units, 301-350=8 units, 351-400=10 units, >400=10 units and Call MD   Yes [provider]  multivitamin (RENA-VIT) TABS tablet Take 1 tablet by mouth daily. (0900)   Yes [provider]  Nutritional Supplements (FEEDING SUPPLEMENT, NEPRO CARB STEADY,) LIQD Take 237 mLs by mouth daily. Patient taking differently: Take 237 mLs by mouth daily with lunch.  02/02/17  Yes Asencion Partridge, MD  ondansetron (ZOFRAN ODT) 4 MG disintegrating tablet Take 1 tablet (4 mg total) by mouth every 8 (eight) hours as needed for nausea or vomiting. 03/26/17  Yes Horton, Barbette Hair, MD  polyethylene glycol powder (GLYCOLAX/MIRALAX) powder Take 17 g by mouth at bedtime. HOLD FOR LOOSE STOOLS    Yes [provider]  psyllium (REGULOID) 0.52 g capsule One po tid prn constipation. Patient taking differently: Take 0.52 g by mouth 3 (three) times daily as needed (for constipation.).  03/22/17  Yes Briscoe Deutscher, DO   Current Facility-Administered Medications  Medication Dose Route Frequency Provider Last Rate Last Dose  . acetaminophen-codeine (TYLENOL #3) 300-30 MG per tablet 1 tablet  1 tablet Oral Q4H PRN Virgina Jock A, PA-C      . alum & mag hydroxide-simeth (MAALOX/MYLANTA) 200-200-20 MG/5ML suspension 15-30 mL  15-30 mL Oral Q2H PRN Serafina Mitchell, MD      . aspirin EC tablet 81 mg  81 mg Oral Daily Serafina Mitchell, MD      . atorvastatin (LIPITOR) tablet 40 mg  40 mg Oral Daily Serafina Mitchell, MD      . calcium acetate (PHOSLO) capsule 2,001 mg  2,001 mg Oral TID WC Serafina Mitchell, MD      . carvedilol (COREG) tablet 3.125 mg   3.125 mg Oral BID WC Serafina Mitchell, MD      . citalopram (CELEXA) tablet 10 mg  10 mg Oral Daily Serafina Mitchell, MD      . feeding supplement (NEPRO CARB STEADY) liquid 237 mL  237 mL Oral Q lunch Serafina Mitchell, MD      . feeding supplement (PRO-STAT SUGAR FREE 64) liquid 30 mL  30 mL Oral BID Serafina Mitchell, MD      . gabapentin (NEURONTIN) capsule 100 mg  100 mg Oral TID Serafina Mitchell, MD      . guaiFENesin-dextromethorphan (ROBITUSSIN DM) 100-10 MG/5ML syrup 15 mL  15 mL Oral Q4H  PRN Serafina Mitchell, MD      . hydrALAZINE (APRESOLINE) injection 5 mg  5 mg Intravenous Q20 Min PRN Serafina Mitchell, MD      . insulin aspart (novoLOG) injection 2-10 Units  2-10 Units Subcutaneous TID PRN Serafina Mitchell, MD      . labetalol (NORMODYNE,TRANDATE) 5 MG/ML injection           . labetalol (NORMODYNE,TRANDATE) injection 10 mg  10 mg Intravenous Q10 min PRN Serafina Mitchell, MD   10 mg at 06/19/17 1036  . metoprolol tartrate (LOPRESSOR) injection 2-5 mg  2-5 mg Intravenous Q2H PRN Serafina Mitchell, MD      . multivitamin (RENA-VIT) tablet 1 tablet  1 tablet Oral Daily Serafina Mitchell, MD      . ondansetron [facility]) injection 4 mg  4 mg Intravenous Q6H PRN Serafina Mitchell, MD      . ondansetron (ZOFRAN-ODT) disintegrating tablet 4 mg  4 mg Oral Q8H PRN Serafina Mitchell, MD      . pantoprazole (PROTONIX) EC tablet 40 mg  40 mg Oral Daily Serafina Mitchell, MD      . phenol (CHLORASEPTIC) mouth spray 1 spray  1 spray Mouth/Throat PRN Serafina Mitchell, MD      . polyethylene glycol powder (GLYCOLAX/MIRALAX) container 17 g  17 g Oral QHS Serafina Mitchell, MD      . potassium chloride SA (K-DUR,KLOR-CON) CR tablet 20-40 mEq  20-40 mEq Oral Once Serafina Mitchell, MD      . psyllium (HYDROCIL/METAMUCIL) packet 1 packet  1 packet Oral TID PRN Serafina Mitchell, MD       Labs: Basic Metabolic Panel:  Recent Labs Lab 06/13/17 1934 06/14/17 0730 06/19/17 0730 06/19/17 1041  NA 129* 133* 137  134*  K 3.8 4.4 3.5 3.5  CL 96* 99*  --  100*  CO2 26 26  --  26  GLUCOSE 260* 139* 132* 147*  BUN 33* 38*  --  21*  CREATININE 5.66* 6.13*  --  4.78*  CALCIUM 8.4* 8.3*  --  8.1*  PHOS 4.6 4.7*  --   --    Liver Function Tests:  Recent Labs Lab 06/13/17 1934 06/14/17 0730 06/19/17 1041  AST  --   --  17  ALT  --   --  15  ALKPHOS  --   --  106  BILITOT  --   --  0.3  PROT  --   --  5.9*  ALBUMIN 2.8* 2.6* 2.9*   CBC:  Recent Labs Lab 06/13/17 1934 06/14/17 0730 06/19/17 0730  WBC 6.8 7.2  --   HGB 8.9* 8.1* 8.8*  HCT 28.2* 25.4* 26.0*  MCV 88.1 86.7  --   PLT 174 168  --    CBG:  Recent Labs Lab 06/13/17 1008 06/13/17 1153 06/13/17 1634 06/19/17 0921  GLUCAP 106* 108* 130* 154*   Dialysis Orders:  TTS at [facility] 4 hr, BFR 400, DFR 800, EDW 68.5kg, 2K/2Ca bath, UF profile 4, linear Na, Heparin 3000 bolus - Hectoral 80mcg IV q HD - Mircera 173mcg IV q 2 weeks (last 8/7) - Venofer 50mg  IV weekly  Assessment/Plan: 1.  S/p R great toe amputation (8/8): Per VVS, pain controlled at this time. 2.  ESRD:  Will continue TTS schedule, next HD 8/9. No heparin. 3.  Hypertension/volume: BP fine, no signs of volume overload. Continue same EDW/meds. 4.  Anemia: Hgb 8.8, just given  ESA on 8/7. Monitor for now. 5.  Metabolic bone disease: Labs ok, continue binders and Hectoral. 6.  Nutrition:  Alb 2.9, continue Nepro/Pro-stat supps. 7.  Type 2 DM: Per primary.  Veneta Penton, PA-C 06/19/2017, 2:34 PM  [facility] Pager: [phone]  Pt seen, examined and agree w A/P as above.  Kelly Splinter MD [employer] pager [phone]   06/20/2017, 9:22 AM

## 2017-06-19 NOTE — Anesthesia Procedure Notes (Signed)
Procedure Name: LMA Insertion Date/Time: 06/19/2017 8:44 AM Performed by: Shirlyn Goltz Pre-anesthesia Checklist: Patient identified, Emergency Drugs available, Suction available and Patient being monitored Patient Re-evaluated:Patient Re-evaluated prior to induction Oxygen Delivery Method: Circle system utilized Preoxygenation: Pre-oxygenation with 100% oxygen Induction Type: IV induction Ventilation: Mask ventilation without difficulty LMA: LMA inserted LMA Size: 4.0 Number of attempts: 1 Placement Confirmation: positive ETCO2 and breath sounds checked- equal and bilateral Tube secured with: Tape Dental Injury: Teeth and Oropharynx as per pre-operative assessment

## 2017-06-19 NOTE — Anesthesia Postprocedure Evaluation (Addendum)
Anesthesia Post Note  Patient: Deborah Hendricks  Procedure(s) Performed: Procedure(s) (LRB): AMPUTATION right great toe (Right)     Patient location during evaluation: PACU Anesthesia Type: General Level of consciousness: awake and alert Pain management: pain level controlled Vital Signs Assessment: post-procedure vital signs reviewed and stable Respiratory status: spontaneous breathing, nonlabored ventilation, respiratory function stable and patient connected to nasal cannula oxygen Cardiovascular status: blood pressure returned to baseline and stable Postop Assessment: no signs of nausea or vomiting Anesthetic complications: no    Last Vitals:  Vitals:   06/19/17 1045 06/19/17 1100  BP: (!) 172/73   Pulse: 65 64  Resp: 13 (!) 9  Temp:  (!) 36.2 C    Last Pain:  Vitals:   06/19/17 1100  TempSrc:   PainSc: 0-No pain                 Donicia Druck DAVID

## 2017-06-19 NOTE — Anesthesia Preprocedure Evaluation (Signed)
Anesthesia Evaluation  Patient identified by MRN, date of birth, ID band Patient awake    Reviewed: Allergy & Precautions, NPO status , Patient's Chart, lab work & pertinent test results  Airway Mallampati: I  TM Distance: >3 FB Neck ROM: Full    Dental   Pulmonary sleep apnea , former smoker,    Pulmonary exam normal        Cardiovascular hypertension, Normal cardiovascular exam     Neuro/Psych Seizures -,  Anxiety Depression CVA    GI/Hepatic GERD  Medicated and Controlled,  Endo/Other  diabetes, Type 2, Insulin Dependent  Renal/GU ESRF and DialysisRenal disease     Musculoskeletal   Abdominal   Peds  Hematology   Anesthesia Other Findings   Reproductive/Obstetrics                             Anesthesia Physical Anesthesia Plan  ASA: III  Anesthesia Plan: General   Post-op Pain Management:    Induction: Intravenous  PONV Risk Score and Plan: 3 and Ondansetron, Midazolam, Propofol infusion and Treatment may vary due to age or medical condition  Airway Management Planned: LMA  Additional Equipment:   Intra-op Plan:   Post-operative Plan: Extubation in OR  Informed Consent: I have reviewed the patients History and Physical, chart, labs and discussed the procedure including the risks, benefits and alternatives for the proposed anesthesia with the patient or authorized representative who has indicated his/her understanding and acceptance.     Plan Discussed with: CRNA and Surgeon  Anesthesia Plan Comments:         Anesthesia Quick Evaluation

## 2017-06-19 NOTE — Telephone Encounter (Signed)
-----   Message from Mena Goes, RN sent at 06/19/2017  9:48 AM EDT ----- Regarding: 3 weeks postop amputation   ----- Message ----- From: Serafina Mitchell, MD Sent: 06/19/2017   9:22 AM To: Vvs Charge Pool  06/19/2017:  Surgeon:  [name] Assistants:  None Procedure:   Right great toe amputation with metatarsal head   Follow-up 3 weeks

## 2017-06-19 NOTE — Telephone Encounter (Signed)
Sched appt 8/27/189 at 10:45. Spoke to daughter and transportation at [employer].

## 2017-06-19 NOTE — Interval H&P Note (Signed)
History and Physical Interval Note:  06/19/2017 8:18 AM  Deborah Hendricks  has presented today for surgery, with the diagnosis of Peripheral Vascular Disease with Right Great Toe Amputation  I70.235  The various methods of treatment have been discussed with the patient and family. After consideration of risks, benefits and other options for treatment, the patient has consented to  Procedure(s): AMPUTATION DIGIT (Right) as a surgical intervention .  The patient's history has been reviewed, patient examined, no change in status, stable for surgery.  I have reviewed the patient's chart and labs.  Questions were answered to the patient's satisfaction.     [name]  Angio done last week by Dr. Donzetta Matters with intervention.  Pt is now here for toe amp. CV:RRR Pulm:CTA Ischemic right toe  Plan for Toe amputation   WElls Landry Kamath

## 2017-06-19 NOTE — Transfer of Care (Signed)
Immediate Anesthesia Transfer of Care Note  Patient: Deborah Hendricks  Procedure(s) Performed: Procedure(s): AMPUTATION right great toe (Right)  Patient Location: PACU  Anesthesia Type:General  Level of Consciousness: drowsy  Airway & Oxygen Therapy: Patient Spontanous Breathing and Patient connected to nasal cannula oxygen  Post-op Assessment: Report given to RN and Post -op Vital signs reviewed and stable  Post vital signs: Reviewed and stable  Last Vitals:  Vitals:   06/19/17 0743 06/19/17 0918  BP: (!) 173/68 (P) 138/71  Pulse: 69 (P) 70  Resp: 18 (!) (P) 9  Temp: 36.7 C (!) 36.2 C    Last Pain:  Vitals:   06/19/17 0743  TempSrc: Oral      Patients Stated Pain Goal: 3 (56/31/49 7026)  Complications: No apparent anesthesia complications

## 2017-06-20 ENCOUNTER — Encounter (HOSPITAL_COMMUNITY): Payer: Self-pay | Admitting: Surgery

## 2017-06-20 DIAGNOSIS — Z992 Dependence on renal dialysis: Secondary | ICD-10-CM | POA: Diagnosis not present

## 2017-06-20 DIAGNOSIS — E1122 Type 2 diabetes mellitus with diabetic chronic kidney disease: Secondary | ICD-10-CM | POA: Diagnosis not present

## 2017-06-20 DIAGNOSIS — D631 Anemia in chronic kidney disease: Secondary | ICD-10-CM | POA: Diagnosis not present

## 2017-06-20 DIAGNOSIS — N2581 Secondary hyperparathyroidism of renal origin: Secondary | ICD-10-CM | POA: Diagnosis not present

## 2017-06-20 DIAGNOSIS — N186 End stage renal disease: Secondary | ICD-10-CM | POA: Diagnosis not present

## 2017-06-20 DIAGNOSIS — I70235 Atherosclerosis of native arteries of right leg with ulceration of other part of foot: Secondary | ICD-10-CM | POA: Diagnosis not present

## 2017-06-20 DIAGNOSIS — I120 Hypertensive chronic kidney disease with stage 5 chronic kidney disease or end stage renal disease: Secondary | ICD-10-CM | POA: Diagnosis not present

## 2017-06-20 LAB — CBC
HCT: 24.8 % — ABNORMAL LOW (ref 36.0–46.0)
Hemoglobin: 7.7 g/dL — ABNORMAL LOW (ref 12.0–15.0)
MCH: 27.6 pg (ref 26.0–34.0)
MCHC: 31 g/dL (ref 30.0–36.0)
MCV: 88.9 fL (ref 78.0–100.0)
PLATELETS: 234 10*3/uL (ref 150–400)
RBC: 2.79 MIL/uL — AB (ref 3.87–5.11)
RDW: 16.4 % — AB (ref 11.5–15.5)
WBC: 6.3 10*3/uL (ref 4.0–10.5)

## 2017-06-20 LAB — BASIC METABOLIC PANEL
ANION GAP: 10 (ref 5–15)
BUN: 28 mg/dL — AB (ref 6–20)
CO2: 26 mmol/L (ref 22–32)
Calcium: 8.1 mg/dL — ABNORMAL LOW (ref 8.9–10.3)
Chloride: 99 mmol/L — ABNORMAL LOW (ref 101–111)
Creatinine, Ser: 5.48 mg/dL — ABNORMAL HIGH (ref 0.44–1.00)
GFR calc Af Amer: 8 mL/min — ABNORMAL LOW (ref >60)
GFR, EST NON AFRICAN AMERICAN: 7 mL/min — AB (ref >60)
GLUCOSE: 254 mg/dL — AB (ref 65–99)
POTASSIUM: 4.4 mmol/L (ref 3.5–5.1)
SODIUM: 135 mmol/L (ref 135–145)

## 2017-06-20 LAB — GLUCOSE, CAPILLARY
GLUCOSE-CAPILLARY: 188 mg/dL — AB (ref 65–99)
GLUCOSE-CAPILLARY: 125 mg/dL — AB (ref 65–99)
GLUCOSE-CAPILLARY: 128 mg/dL — AB (ref 65–99)
Glucose-Capillary: 66 mg/dL (ref 65–99)
Glucose-Capillary: 76 mg/dL (ref 65–99)

## 2017-06-20 MED ORDER — ACETAMINOPHEN 325 MG PO TABS
650.0000 mg | ORAL_TABLET | ORAL | Status: DC | PRN
  Administered 2017-06-20: 650 mg via ORAL

## 2017-06-20 MED ORDER — NEPRO/CARBSTEADY PO LIQD: 237.0000 mL | Freq: Every day | ORAL | Status: AC

## 2017-06-20 MED ORDER — HYDROCODONE-ACETAMINOPHEN 5-325 MG PO TABS: 1.0000 | ORAL_TABLET | Freq: Four times a day (QID) | ORAL | 0 refills | Status: DC | PRN

## 2017-06-20 MED ORDER — GABAPENTIN 100 MG PO CAPS: 100.0000 mg | ORAL_CAPSULE | Freq: Every day | ORAL | Status: AC

## 2017-06-20 MED ORDER — APIXABAN 2.5 MG PO TABS: 2.5000 mg | ORAL_TABLET | Freq: Two times a day (BID) | ORAL | 11 refills | Status: DC

## 2017-06-20 MED ORDER — POLYETHYLENE GLYCOL 3350 17 G PO PACK
17.0000 g | PACK | Freq: Every day | ORAL | Status: DC
Start: 2017-06-20 — End: 2017-06-21
  Administered 2017-06-20: 17 g via ORAL
  Filled 2017-06-20 (×2): qty 1

## 2017-06-20 NOTE — Progress Notes (Signed)
Clinical Social Worker was consulted to assist patient in returning back to her long term SNF. Patient is from [facility] and would like to return back to facility. Patient is agreeable to return after dialysis. Patient verbalized she would like to be transported back to facility but stated she would like the facility to pickup her wheelchair.  CSW contacted [facility] admission coordinator Freda Munro in regards to patients transportation needs. Freda Munro stated they will be able to pick up patient and her wheelchair once discharge.   Rhea Pink, MSW,  [place]

## 2017-06-20 NOTE — Progress Notes (Addendum)
  Progress Note    06/20/2017 8:09 AM 1 Day Post-Op  Subjective:  No complaints this morning  Afebrile HR 60's-70's NSR 583'E-940'H systolic 680% RA  Vitals:   06/20/17 0011 06/20/17 0533  BP: (!) 160/70   Pulse: 71   Resp: 18   Temp: 98.4 F (36.9 C) 98.3 F (36.8 C)    Physical Exam: Lungs:  Non labored Incisions:  Ethilon sutures in place with mild bloody ooze between sutures   CBC    Component Value Date/Time   WBC 7.2 06/14/2017 0730   RBC 2.93 (L) 06/14/2017 0730   HGB 8.8 (L) 06/19/2017 0730   HCT 26.0 (L) 06/19/2017 0730   PLT 168 06/14/2017 0730   MCV 86.7 06/14/2017 0730   MCH 27.6 06/14/2017 0730   MCHC 31.9 06/14/2017 0730   RDW 15.9 (H) 06/14/2017 0730   LYMPHSABS 2.1 06/05/2017 1947   MONOABS 0.7 06/05/2017 1947   EOSABS 0.3 06/05/2017 1947   BASOSABS 0.0 06/05/2017 1947    BMET    Component Value Date/Time   NA 135 06/20/2017 0231   K 4.4 06/20/2017 0231   CL 99 (L) 06/20/2017 0231   CO2 26 06/20/2017 0231   GLUCOSE 254 (H) 06/20/2017 0231   BUN 28 (H) 06/20/2017 0231   CREATININE 5.48 (H) 06/20/2017 0231   CALCIUM 8.1 (L) 06/20/2017 0231   GFRNONAA 7 (L) 06/20/2017 0231   GFRAA 8 (L) 06/20/2017 0231    INR    Component Value Date/Time   INR 1.17 06/05/2017 1947     Intake/Output Summary (Last 24 hours) at 06/20/17 0809 Last data filed at 06/19/17 2350  Gross per 24 hour  Intake              680 ml  Output               50 ml  Net              630 ml     Assessment:  71 y.o. female is s/p:   Right great toe amputation with metatarsal head  1 Day Post-Op  Plan: -pt doing well this morning.  Dressing removed and there was a mild bloody ooze from between the ethilons.  Wound re-packed with 2x2 between the sutures. -for dialysis today-possible discharge back to SNF after HD or tomorrow. -will order post op shoe.  -social work consult for return to [facility]   Leontine Locket, PA-C [facility] [phone] 06/20/2017 8:09 AM  Dressing changed this am.  Will need HH for wet to dry daily changes HD today then d/c home Eliquis to restart today F/u 3 weeks  Wells BRabham

## 2017-06-20 NOTE — Progress Notes (Signed)
Inpatient Diabetes Program Recommendations  AACE/ADA: New Consensus Statement on Inpatient Glycemic Control (2015)  Target Ranges:  Prepandial:   less than 140 mg/dL      Peak postprandial:   less than 180 mg/dL (1-2 hours)      Critically ill patients:  140 - 180 mg/dL   Results for Deborah Hendricks, Deborah Hendricks (MRN [id number]) as of 06/20/2017 11:11  Ref. Range 06/19/2017 09:21 06/19/2017 15:27 06/19/2017 22:14 06/20/2017 06:32  Glucose-Capillary Latest Ref Range: 65 - 99 mg/dL 154 (H) 182 (H) 297 (H) 188 (H)    Review of Glycemic Control  Diabetes history: DM2 Outpatient Diabetes medications: Novolog 2-10 units TID with meals PRN for high glucose Current orders for Inpatient glycemic control:  Novolog 2-10 units TID with meals PRN for high glucose   Inpatient Diabetes Program Recommendations: Correction (SSI): While inpatient, please discontinue current Novolog 2-10 units TID PRN order and use Glycemic Control order set to order CBGs with Novolog 0-9 units TID with meals and Novolog 0-5 units QHS.  Thanks, Barnie Alderman, RN, MSN, CDE Diabetes Coordinator Inpatient Diabetes Program [phone] (Team Pager from 8am to 5pm)

## 2017-06-20 NOTE — Care Management Note (Signed)
Case Management Note  Patient Details  Name: Deborah Hendricks MRN: [id number] Date of Birth: 1946/07/15  Subjective/Objective:                    Action/Plan:   Expected Discharge Date:  06/14/17               Expected Discharge Plan:  Skilled Nursing Facility  In-House Referral:  Clinical Social Work  Discharge planning Services  CM Consult  Post Acute Care Choice:  NA Choice offered to:  NA  DME Arranged:    DME Agency:     HH Arranged:    [place] Agency:     Status of Service:  Completed, signed off  If discussed at [employer], dates discussed:    Additional Comments:  Dawayne Patricia, RN 06/20/2017, 9:21 AM

## 2017-06-20 NOTE — Progress Notes (Signed)
[facility] Progress Note  Subjective: feels tired, "listless", not right. BP's ok.   Vitals:   06/19/17 1805 06/19/17 2217 06/20/17 0011 06/20/17 0533  BP: (!) 155/69 (!) 154/71 (!) 160/70   Pulse: 68 72 71   Resp: 15 17 18    Temp:  98 F (36.7 C) 98.4 F (36.9 C) 98.3 F (36.8 C)  TempSrc:  Oral Oral Oral  SpO2: 100% 100% 98%   Weight:      Height:        Inpatient medications: . aspirin EC  81 mg Oral Daily  . atorvastatin  40 mg Oral Daily  . calcium acetate  2,001 mg Oral TID WC  . carvedilol  3.125 mg Oral BID WC  . citalopram  10 mg Oral Daily  . feeding supplement (NEPRO CARB STEADY)  237 mL Oral Q lunch  . feeding supplement (PRO-STAT SUGAR FREE 64)  30 mL Oral BID  . gabapentin  100 mg Oral TID  . multivitamin  1 tablet Oral Daily  . pantoprazole  40 mg Oral Daily  . polyethylene glycol  17 g Oral QHS  . potassium chloride  20-40 mEq Oral Once   . sodium chloride    . sodium chloride     sodium chloride, sodium chloride, acetaminophen, alteplase, alum & mag hydroxide-simeth, guaiFENesin-dextromethorphan, heparin, hydrALAZINE, insulin aspart, labetalol, lidocaine (PF), lidocaine-prilocaine, metoprolol tartrate, ondansetron, ondansetron, pentafluoroprop-tetrafluoroeth, phenol, psyllium  Exam: Elderly obese AAF, no distress No jvd Chest clear bilat RRR no mrg Abd obese soft ntnd L AKA w/o edema R foot bandaged post-op No LE edema NF, Ox3   TDC in chest, no drainage  D[facility]: [facility]  68.5kg   P4  Hep 3000  TDC chest - Hectoral 5mcg IV q HD - Mircera 167mcg IV q 2 weeks (last 8/7) - Venofer 50mg  IV weekly      Impression: 1. S/p R great toe amputation (8/8): Per VVS, pain controlled at this time 2. ESRD:  Will continue TTS schedule, next HD 8/9. No heparin 3. Hypertension/volume: BP fine, no signs of volume overload. Continue same EDW/meds. 4. Anemia: Hgb 8.8, just given ESA on 8/7. Monitor for now. 5. Metabolic bone disease:  Labs ok, continue binders and Hectoral. 6. Nutrition:  Alb 2.9, continue Nepro/Pro-stat supps. 7. Type 2 DM: Per primary.  Plan - HD today   Kelly Splinter MD [facility] pager [phone]   06/20/2017, 11:40 AM    Recent Labs Lab 06/13/17 1934 06/14/17 0730 06/19/17 0730 06/19/17 1041 06/20/17 0231  NA 129* 133* 137 134* 135  K 3.8 4.4 3.5 3.5 4.4  CL 96* 99*  --  100* 99*  CO2 26 26  --  26 26  GLUCOSE 260* 139* 132* 147* 254*  BUN 33* 38*  --  21* 28*  CREATININE 5.66* 6.13*  --  4.78* 5.48*  CALCIUM 8.4* 8.3*  --  8.1* 8.1*  PHOS 4.6 4.7*  --   --   --     Recent Labs Lab 06/13/17 1934 06/14/17 0730 06/19/17 1041  AST  --   --  17  ALT  --   --  15  ALKPHOS  --   --  106  BILITOT  --   --  0.3  PROT  --   --  5.9*  ALBUMIN 2.8* 2.6* 2.9*    Recent Labs Lab 06/13/17 1934 06/14/17 0730 06/19/17 0730 06/20/17 0231  WBC 6.8 7.2  --  6.3  HGB 8.9* 8.1* 8.8* 7.7*  HCT 28.2* 25.4* 26.0* 24.8*  MCV 88.1 86.7  --  88.9  PLT 174 168  --  234   Iron/TIBC/Ferritin/ %Sat No results found for: IRON, TIBC, FERRITIN, IRONPCTSAT

## 2017-06-20 NOTE — Progress Notes (Signed)
RN called to room, Pt "feels like she's going to faint."  VSS, CBG initially low but improving with oral intake.  Briefly tachypneaic, but improved with repositioning and verbal relaxation coaching.  Attending team and rapid response made aware.  RN stayed with Pt until she expressed feeling "much better." Will con't to monitor closely.

## 2017-06-20 NOTE — Discharge Summary (Signed)
[facility] Discharge Summary  Deborah Hendricks [birth year] 71 y.o. female  [id number]  Admission Date: 06/19/2017  Discharge Date: 06/21/2017  Physician: Serafina Mitchell, MD  Admission Diagnosis: Peripheral Vascular Disease with Right Great Toe Amputation  I70.235  HPI:   This is a 71 y.o. female who presented for evaluation of a left foot ulcer that has been present for many weeks. She is status post left above-knee amputation on 12/15/2015. She also suffers from end-stage renal disease. She has undergone a basilic vein procedure which has not developed. She has a catheter in her right thigh. He has a history of stroke. She is on anticoagulation. She suffers from diabetes with multiple complications.  On 11/27/2016 she underwent atherectomy and drug coated balloon angioplasty of the superficial femoral and popliteal artery on the right. She also had balloon angioplasty of the stenosis within the tibioperoneal trunk and proximal peroneal artery. She has single-vessel runoff via the peroneal artery.  The patient was scheduled to undergo angiography 3 days ago however, she did not show up. She has elevated velocities within the proximal and mid superficial femoral arteries. This had previously been treated with a 1.5 CSI classic device and a 4 mm drug coated balloon. The tibioperoneal trunk was treated with primary balloon angioplasty using a 3 mm balloon.  Hospital Course:  The patient was admitted to the hospital and taken to the operating room on 06/19/2017 and underwent: amputation right great toe  The patient tolerated the procedure well and was transported to the PACU in stable condition.   She was admitted for observation. Renal was consulted for dialysis. Her dressing was changed on POD 1. There was mild ooze from between the nylon stitches. Her wounds were repacked with 2 x 2 gauze in between the sutures. A post-op shoe was ordered. She was restarted  on Eliquis.   Due to staffing shortage, patient was unable to get dialysis on 06/20/17. She dialyzed on 06/21/17. Her toe amputation site was clean. She will be discharged to SNF on 06/21/17.   CBC    Component Value Date/Time   WBC 6.3 06/20/2017 0231   RBC 2.79 (L) 06/20/2017 0231   HGB 7.7 (L) 06/20/2017 0231   HCT 24.8 (L) 06/20/2017 0231   PLT 234 06/20/2017 0231   MCV 88.9 06/20/2017 0231   MCH 27.6 06/20/2017 0231   MCHC 31.0 06/20/2017 0231   RDW 16.4 (H) 06/20/2017 0231   LYMPHSABS 2.1 06/05/2017 1947   MONOABS 0.7 06/05/2017 1947   EOSABS 0.3 06/05/2017 1947   BASOSABS 0.0 06/05/2017 1947    BMET    Component Value Date/Time   NA 135 06/20/2017 0231   K 4.4 06/20/2017 0231   CL 99 (L) 06/20/2017 0231   CO2 26 06/20/2017 0231   GLUCOSE 254 (H) 06/20/2017 0231   BUN 28 (H) 06/20/2017 0231   CREATININE 5.48 (H) 06/20/2017 0231   CALCIUM 8.1 (L) 06/20/2017 0231   GFRNONAA 7 (L) 06/20/2017 0231   GFRAA 8 (L) 06/20/2017 0231     Discharge Instructions:   The patient is discharged to SNF with extensive instructions on wound care and progressive ambulation.  They are instructed not to drive or perform any heavy lifting until returning to see the physician in his office.  Discharge wound care: Pack right toe open wound with nu gauze or moist 2 x 2 gauze in between nylon stiches twice daily. Wrap with gauze wrap and ACE daily.   Discharge Diagnosis:  Peripheral Vascular Disease with Right Great Toe Amputation  I70.235  Secondary Diagnosis: Patient Active Problem List   Diagnosis Date Noted  . Ischemia of right lower extremity 06/14/2017  . Hyperkalemia 06/05/2017  . Bleeding due to dialysis catheter placement ([place]) 06/05/2017  . History of hysterectomy 03/09/2017  . Former smoker 03/09/2017  . Dysphagia 03/09/2017  . Carotid stenosis 03/09/2017  . Need for assistance due to reduced mobility 03/09/2017  . Complication of vascular access for dialysis 03/09/2017  .  Anemia in chronic kidney disease (CKD) 03/09/2017  . Sleep apnea   . Refusal of blood transfusions as patient is Jehovah's Witness   . Constipation   . GERD (gastroesophageal reflux disease)   . HLD (hyperlipidemia)   . TIA (transient ischemic attack) 02/01/2017  . Diabetic ulcer of toe of right foot associated with diabetes mellitus due to underlying condition, limited to breakdown of skin ([place])   . PVD (peripheral vascular disease) ([place]) 11/27/2016  . Blurry vision   . Asymmetric SNHL (sensorineural hearing loss) 06/12/2016  . Peripheral neuropathy 06/11/2016  . History of pulmonary embolism 06/08/2016  . History of CVA with residual deficit 06/08/2016  . History of borderline personality disorder 04/19/2016  . History of medication noncompliance 04/19/2016  . Paroxysmal atrial fibrillation ([place]) 04/19/2016  . Seizure disorder ([place]) 04/19/2016  . Renovascular hypertension 04/13/2016  . ESRD on dialysis ([place]) 04/11/2016  . Generalized anxiety disorder 04/11/2016  . Major depression, recurrent, chronic ([place]) 04/11/2016  . S/P AKA (above knee amputation) unilateral, left ([place]) 04/11/2016  . PAD (peripheral artery disease) ([place]) 12/15/2015  . Type II diabetes mellitus with neurological manifestations ([place]) 10/21/2015  . Seizures ([place]) 04/13/2015   Past Medical History:  Diagnosis Date  . Anemia   . Anxiety   . CHF (congestive heart failure) ([place])   . Constipation   . Critical lower limb ischemia   . Depression   . ESRD on dialysis [facility])    "TTS; [place]" (11/27/2016)  . GERD (gastroesophageal reflux disease)   . Heart murmur   . HLD (hyperlipidemia)   . Hypertension   . Memory loss   . Peripheral vascular disease ([place])   . Refusal of blood transfusions as patient is Jehovah's Witness   . Seizures ([place]) 04/2015   s/p CVA.  [name] Sleep apnea    Noncompliant with CPAP.  [name] Stroke ([place]) 2015  . Type II diabetes mellitus (H[facility])      Allergies as of 06/20/2017      Reactions    Penicillins Hives, Itching, Rash, Other (See Comments)   Has patient had a PCN reaction causing immediate rash, facial/tongue/throat swelling, SOB or lightheadedness with hypotension: Yes Has patient had a PCN reaction causing severe rash involving mucus membranes or skin necrosis:unknown MAR source Has patient had a PCN reaction that required hospitalization:unknown MAR source Has patient had a PCN reaction occurring within the last 10 years:unknown MAR source If all of the above answers are "NO", then may proceed with Cephalosporin use.   Sulfa Antibiotics    UNSPECIFIED REACTION    Amoxicillin Hives, Itching, Rash   Has patient had a PCN reaction causing immediate rash, facial/tongue/throat swelling, SOB or lightheadedness with hypotension:unknown MAR source Has patient had a PCN reaction causing severe rash involving mucus membranes or skin necrosis:unknown MAR source Has patient had a PCN reaction that required hospitalization:unknown MAR source Has patient had a PCN reaction occurring within the last 10 years: unknown MAR source If all of the above  answers are "NO", then may proceed with Cephalosporin use.   Latex Rash      Medication List    TAKE these medications   apixaban 2.5 MG Tabs tablet Commonly known as:  ELIQUIS Take 1 tablet (2.5 mg total) by mouth 2 (two) times daily.   aspirin EC 81 MG tablet Take 81 mg by mouth daily.   atorvastatin 40 MG tablet Commonly known as:  LIPITOR Take 1 tablet (40 mg total) by mouth daily. What changed:  additional instructions   calcium acetate 667 MG capsule Commonly known as:  PHOSLO Take 2,001 mg by mouth 3 (three) times daily with meals.   carvedilol 3.125 MG tablet Commonly known as:  COREG Take 1 tablet (3.125 mg total) by mouth 2 (two) times daily with a meal.   citalopram 10 MG tablet Commonly known as:  CELEXA Take 10 mg by mouth daily. (0900)   esomeprazole 20 MG capsule Commonly known as:  NEXIUM Take 1 capsule  (20 mg total) by mouth daily.   feeding supplement (NEPRO CARB STEADY) Liqd Take 237 mLs by mouth daily with lunch.   feeding supplement (PRO-STAT SUGAR FREE 64) Liqd Take 30 mLs by mouth 2 (two) times daily. (0900 & 2100)   gabapentin 100 MG capsule Commonly known as:  NEURONTIN Take 1 capsule (100 mg total) by mouth at bedtime.   insulin aspart 100 UNIT/ML injection Commonly known as:  novoLOG Inject 2-10 Units into the skin 3 (three) times daily as needed for high blood sugar. 151-200=2 units, 201-250=4 units, 251-300=6 units, 301-350=8 units, 351-400=10 units, >400=10 units and Call MD   multivitamin Tabs tablet Take 1 tablet by mouth daily. (0900)   ondansetron 4 MG disintegrating tablet Commonly known as:  ZOFRAN ODT Take 1 tablet (4 mg total) by mouth every 8 (eight) hours as needed for nausea or vomiting.   polyethylene glycol powder powder Commonly known as:  GLYCOLAX/MIRALAX Take 17 g by mouth at bedtime. HOLD FOR LOOSE STOOLS   psyllium 0.52 g capsule Commonly known as:  REGULOID One po tid prn constipation. What changed:  how much to take  how to take this  when to take this  reasons to take this  additional instructions       Disposition: SNF  Patient's condition: is Good  Follow up: 1. Dr. Trula Slade in 3 weeks   Virgina Jock, PA-C [facility] [phone] 06/20/2017  1:36 PM

## 2017-06-20 NOTE — Progress Notes (Signed)
Orthopedic Tech Progress Note Patient Details:  Deborah Hendricks [birth year] [id number]  Ortho Devices Type of Ortho Device: Postop shoe/boot Ortho Device/Splint Location: rle Ortho Device/Splint Interventions: Application   Loana Salvaggio 06/20/2017, 9:10 AM

## 2017-06-20 NOTE — Progress Notes (Signed)
Clinical Social Worker facilitated patient discharge including contacting patient family and facility to confirm patient discharge plans.  Clinical information faxed to facility and family agreeable with plan.  CSW arranged ambulance transport via [place] to [facility] .  RN Nego to call [phone] for report prior to discharge.  Clinical Social Worker will sign off for now as social work intervention is no longer needed. Please consult [place] again if new need arises.  Rhea Pink, MSW, [place]

## 2017-06-20 NOTE — Progress Notes (Addendum)
Planned and arranged for Pt to DC back to SNF after HD; however at time of this note Pt has still not been taken to HD.  RN has confirmed that she will be dialyzed tonight, but will be quite late when she returns. Per conversation with PA Trihn, and in accordance with Pt and family expressed wishes, Pt will DC in the morning.  Facility aware of change.  Will report to night RN.

## 2017-06-21 DIAGNOSIS — N179 Acute kidney failure, unspecified: Secondary | ICD-10-CM | POA: Diagnosis not present

## 2017-06-21 DIAGNOSIS — R293 Abnormal posture: Secondary | ICD-10-CM | POA: Diagnosis not present

## 2017-06-21 DIAGNOSIS — I70235 Atherosclerosis of native arteries of right leg with ulceration of other part of foot: Secondary | ICD-10-CM | POA: Diagnosis not present

## 2017-06-21 DIAGNOSIS — N2581 Secondary hyperparathyroidism of renal origin: Secondary | ICD-10-CM | POA: Diagnosis not present

## 2017-06-21 DIAGNOSIS — D631 Anemia in chronic kidney disease: Secondary | ICD-10-CM | POA: Diagnosis not present

## 2017-06-21 DIAGNOSIS — E1122 Type 2 diabetes mellitus with diabetic chronic kidney disease: Secondary | ICD-10-CM | POA: Diagnosis not present

## 2017-06-21 DIAGNOSIS — S98119A Complete traumatic amputation of unspecified great toe, initial encounter: Secondary | ICD-10-CM | POA: Diagnosis not present

## 2017-06-21 DIAGNOSIS — I120 Hypertensive chronic kidney disease with stage 5 chronic kidney disease or end stage renal disease: Secondary | ICD-10-CM | POA: Diagnosis not present

## 2017-06-21 DIAGNOSIS — N186 End stage renal disease: Secondary | ICD-10-CM | POA: Diagnosis not present

## 2017-06-21 DIAGNOSIS — Z992 Dependence on renal dialysis: Secondary | ICD-10-CM | POA: Diagnosis not present

## 2017-06-21 LAB — RENAL FUNCTION PANEL
ANION GAP: 9 (ref 5–15)
Albumin: 2.7 g/dL — ABNORMAL LOW (ref 3.5–5.0)
BUN: 38 mg/dL — ABNORMAL HIGH (ref 6–20)
CHLORIDE: 96 mmol/L — AB (ref 101–111)
CO2: 28 mmol/L (ref 22–32)
Calcium: 8.8 mg/dL — ABNORMAL LOW (ref 8.9–10.3)
Creatinine, Ser: 6.2 mg/dL — ABNORMAL HIGH (ref 0.44–1.00)
GFR, EST AFRICAN AMERICAN: 7 mL/min — AB (ref >60)
GFR, EST NON AFRICAN AMERICAN: 6 mL/min — AB (ref >60)
Glucose, Bld: 128 mg/dL — ABNORMAL HIGH (ref 65–99)
POTASSIUM: 5.5 mmol/L — AB (ref 3.5–5.1)
Phosphorus: 4.6 mg/dL (ref 2.5–4.6)
Sodium: 133 mmol/L — ABNORMAL LOW (ref 135–145)

## 2017-06-21 LAB — CBC
HEMATOCRIT: 24.9 % — AB (ref 36.0–46.0)
Hemoglobin: 7.9 g/dL — ABNORMAL LOW (ref 12.0–15.0)
MCH: 27.7 pg (ref 26.0–34.0)
MCHC: 31.7 g/dL (ref 30.0–36.0)
MCV: 87.4 fL (ref 78.0–100.0)
Platelets: 250 10*3/uL (ref 150–400)
RBC: 2.85 MIL/uL — AB (ref 3.87–5.11)
RDW: 16.3 % — ABNORMAL HIGH (ref 11.5–15.5)
WBC: 7.1 10*3/uL (ref 4.0–10.5)

## 2017-06-21 LAB — GLUCOSE, CAPILLARY
GLUCOSE-CAPILLARY: 139 mg/dL — AB (ref 65–99)
GLUCOSE-CAPILLARY: 140 mg/dL — AB (ref 65–99)

## 2017-06-21 NOTE — Progress Notes (Signed)
HD tx initiated via HD cath w/o problem, AP pulls/push/flush sluggish, VP pull/push/flush well, had increasing AP not long after tx initiated, dec BFR, can't reverse d/t AP sluggish push, VSS, will cont to monitor

## 2017-06-21 NOTE — Care Management Note (Signed)
Case Management Note  Patient Details  Name: Deborah Hendricks MRN: [id number] Date of Birth: 07/30/1946  Subjective/Objective:                 Patient with order to DC to SNF. DC to SNF facilitated by CSW. Anticipate return to [facility] today.    Action/Plan:  DC to SNF today.   Expected Discharge Date:  06/20/17               Expected Discharge Plan:  Skilled Nursing Facility  In-House Referral:  Clinical Social Work  Discharge planning Services  CM Consult  Post Acute Care Choice:    Choice offered to:     DME Arranged:    DME Agency:     HH Arranged:    [place] Agency:     Status of Service:  Completed, signed off  If discussed at [employer], dates discussed:    Additional Comments:  Carles Collet, RN 06/21/2017, 10:13 AM

## 2017-06-21 NOTE — Progress Notes (Signed)
Patient off the floor for Dialysis. CCMD notified.

## 2017-06-21 NOTE — Progress Notes (Signed)
  Progress Note  SUBJECTIVE:    POD #2  No complaints this am. Just finished dialysis about an hour ago.   OBJECTIVE:   Vitals:   06/21/17 0515 06/21/17 0537  BP: (!) 97/53 (!) 123/55  Pulse: 70 73  Resp: 18 13  Temp:  (!) 97 F (36.1 C)  SpO2: 97% 100%    Intake/Output Summary (Last 24 hours) at 06/21/17 0740 Last data filed at 06/21/17 0515  Gross per 24 hour  Intake              600 ml  Output             1700 ml  Net            -1100 ml   Right toe amputation site clean without any active drainage.   ASSESSMENT/PLAN:   71 y.o. female is s/p: right great toe amputation 2 Days Post-Op   Had dialysis early this am, due to lack of staffing shortage in HD yesterday. Ok to d/c to SNF today.   Alvia Grove 06/21/2017 7:40 AM -- LABS:   CBC    Component Value Date/Time   WBC 7.1 06/21/2017 0204   HGB 7.9 (L) 06/21/2017 0204   HCT 24.9 (L) 06/21/2017 0204   PLT 250 06/21/2017 0204    BMET    Component Value Date/Time   NA 133 (L) 06/21/2017 0204   K 5.5 (H) 06/21/2017 0204   CL 96 (L) 06/21/2017 0204   CO2 28 06/21/2017 0204   GLUCOSE 128 (H) 06/21/2017 0204   BUN 38 (H) 06/21/2017 0204   CREATININE 6.20 (H) 06/21/2017 0204   CALCIUM 8.8 (L) 06/21/2017 0204   GFRNONAA 6 (L) 06/21/2017 0204   GFRAA 7 (L) 06/21/2017 0204    COAG Lab Results  Component Value Date   INR 1.17 06/05/2017   INR 1.32 01/31/2017   INR 1.11 11/27/2016   No results found for: PTT  ANTIBIOTICS:   Anti-infectives    Start     Dose/Rate Route Frequency Ordered Stop   06/19/17 0730  vancomycin (VANCOCIN) IVPB 1000 mg/200 mL premix     1,000 mg 200 mL/hr over 60 Minutes Intravenous To ShortStay Surgical 06/18/17 1350 06/19/17 Luis M. Cintron, PA-C [facility] Office: [phone] Pager: [phone] 06/21/2017 7:40 AM

## 2017-06-21 NOTE — Progress Notes (Signed)
Patient transported to SNF via ems discharged as ordered. Kory Panjwani, Bettina Gavia RN

## 2017-06-21 NOTE — Progress Notes (Signed)
[facility] Progress Note  Subjective: feels better off of codeine, for DC to SNF today  Vitals:   06/21/17 0500 06/21/17 0515 06/21/17 0537 06/21/17 1015  BP: (!) 94/56 (!) 97/53 (!) 123/55 125/61  Pulse: 71 70 73 69  Resp: 12 18 13    Temp:   (!) 97 F (36.1 C)   TempSrc:   Oral   SpO2: 96% 97% 100%   Weight:   62 kg (136 lb 11 oz)   Height:        Inpatient medications: . aspirin EC  81 mg Oral Daily  . atorvastatin  40 mg Oral Daily  . calcium acetate  2,001 mg Oral TID WC  . carvedilol  3.125 mg Oral BID WC  . citalopram  10 mg Oral Daily  . feeding supplement (NEPRO CARB STEADY)  237 mL Oral Q lunch  . feeding supplement (PRO-STAT SUGAR FREE 64)  30 mL Oral BID  . gabapentin  100 mg Oral TID  . multivitamin  1 tablet Oral Daily  . pantoprazole  40 mg Oral Daily  . polyethylene glycol  17 g Oral QHS   . sodium chloride    . sodium chloride     sodium chloride, sodium chloride, acetaminophen, alteplase, alum & mag hydroxide-simeth, guaiFENesin-dextromethorphan, heparin, hydrALAZINE, insulin aspart, labetalol, lidocaine (PF), lidocaine-prilocaine, metoprolol tartrate, ondansetron, ondansetron, pentafluoroprop-tetrafluoroeth, phenol, psyllium  Exam: Elderly obese AAF, no distress No jvd Chest clear bilat RRR no mrg Abd obese soft ntnd L AKA w/o edema R foot bandaged post-op No LE edema NF, Ox3   TDC in chest, no drainage  Dialysis: [facility] 4[facility]  68.5kg   P4  Hep 3000  TDC chest - Hectoral 63mcg IV q HD - Mircera 114mcg IV q 2 weeks (last 8/7) - Venofer 50mg  IV weekly      Impression: 1. S/p R great toe amputation (8/8): avoid narcotics due to side effects 2. ESRD:  HD TTS 3. Hypertension/volume: BP fine, no signs of volume overload. Continue same EDW/meds. 4. Anemia: Hgb 8.8, just given ESA on 8/7. Monitor for now. 5. Metabolic bone disease: Labs ok, continue binders and Hectoral. 6. Nutrition:  Alb 2.9, continue Nepro/Pro-stat  supps. 7. Type 2 DM: Per primary.  Plan - for dc today to SNF   Kelly Splinter MD [facility] pager [phone]   06/21/2017, 11:45 AM    Recent Labs Lab 06/19/17 1041 06/20/17 0231 06/21/17 0204  NA 134* 135 133*  K 3.5 4.4 5.5*  CL 100* 99* 96*  CO2 26 26 28   GLUCOSE 147* 254* 128*  BUN 21* 28* 38*  CREATININE 4.78* 5.48* 6.20*  CALCIUM 8.1* 8.1* 8.8*  PHOS  --   --  4.6    Recent Labs Lab 06/19/17 1041 06/21/17 0204  AST 17  --   ALT 15  --   ALKPHOS 106  --   BILITOT 0.3  --   PROT 5.9*  --   ALBUMIN 2.9* 2.7*    Recent Labs Lab 06/19/17 0730 06/20/17 0231 06/21/17 0204  WBC  --  6.3 7.1  HGB 8.8* 7.7* 7.9*  HCT 26.0* 24.8* 24.9*  MCV  --  88.9 87.4  PLT  --  234 250   Iron/TIBC/Ferritin/ %Sat No results found for: IRON, TIBC, FERRITIN, IRONPCTSAT

## 2017-06-21 NOTE — Progress Notes (Signed)
HD tx completed @ 0515 w/ bp issues requiring UF off for quite a bit of tx, UF goal not met, blood rinsed back, attempted to call report 4x but no one answered phone at [phone]

## 2017-06-21 NOTE — Progress Notes (Signed)
Patient will discharge to [facility] SNF Anticipated discharge date: 37/10 Family notified: dtr at bedside Transportation by [facility]- scheduled at 1:30pm  CSW signing off.  Jorge Ny, LCSW Clinical Social Worker [phone]

## 2017-06-21 NOTE — Progress Notes (Signed)
Inpatient Diabetes Program Recommendations  AACE/ADA: New Consensus Statement on Inpatient Glycemic Control (2015)  Target Ranges:  Prepandial:   less than 140 mg/dL      Peak postprandial:   less than 180 mg/dL (1-2 hours)      Critically ill patients:  140 - 180 mg/dL   Results for OHANNA, GASSERT (MRN [id number]) as of 06/21/2017 07:33  Ref. Range 06/20/2017 06:32 06/20/2017 11:40 06/20/2017 12:34 06/20/2017 16:30 06/20/2017 22:35 06/21/2017 06:40  Glucose-Capillary Latest Ref Range: 65 - 99 mg/dL 188 (H) 66 76 125 (H) 128 (H) 139 (H)    Review of Glycemic Control  Diabetes history: DM2 Outpatient Diabetes medications: Novolog 2-10 units TID with meals PRN for high glucose Current orders for Inpatient glycemic control:  Novolog 2-10 units TID with meals PRN for high glucose   Inpatient Diabetes Program Recommendations: Correction (SSI): While inpatient, please discontinue current Novolog 2-10 units TID PRN order and use Glycemic Control order set to order CBGs with Novolog 0-9 units TID with meals and Novolog 0-5 units QHS.  Thanks, Barnie Alderman, RN, MSN, CDE Diabetes Coordinator Inpatient Diabetes Program [phone] (Team Pager from 8am to 5pm)

## 2017-06-22 DIAGNOSIS — N186 End stage renal disease: Secondary | ICD-10-CM | POA: Diagnosis not present

## 2017-06-22 DIAGNOSIS — R293 Abnormal posture: Secondary | ICD-10-CM | POA: Diagnosis not present

## 2017-06-24 ENCOUNTER — Telehealth: Payer: Self-pay | Admitting: *Deleted

## 2017-06-24 ENCOUNTER — Encounter: Payer: Self-pay | Admitting: Family Medicine

## 2017-06-24 ENCOUNTER — Telehealth: Payer: Self-pay | Admitting: Family Medicine

## 2017-06-24 ENCOUNTER — Ambulatory Visit: Payer: Medicare Other | Admitting: Family Medicine

## 2017-06-24 DIAGNOSIS — N186 End stage renal disease: Secondary | ICD-10-CM | POA: Diagnosis not present

## 2017-06-24 DIAGNOSIS — R293 Abnormal posture: Secondary | ICD-10-CM | POA: Diagnosis not present

## 2017-06-24 NOTE — Telephone Encounter (Signed)
I have sent you the My chart message about this. I believe that you are not consider her PCP any longer. Please refer to my chart message and what daughter has wrote.

## 2017-06-24 NOTE — Telephone Encounter (Signed)
See other message re this patient. Thanks. EW

## 2017-06-24 NOTE — Telephone Encounter (Signed)
Noted. See new note from 06/24/17

## 2017-06-24 NOTE — Telephone Encounter (Signed)
Patients daughter called in regarding todays appt. She states that the patient feels that she was overdosed on an unknown medication last night. She would not divulge any sx. She asked if there are any labs that could be done to test to see what meds are in her system. Conferred with Lucina Mellow, RN. I was advised that the provider would look at symptoms the patient is experiencing and back into any possible tests only if appropriate. I conveyed this to the patients daughter. She understood.

## 2017-06-24 NOTE — Telephone Encounter (Signed)
Once patient entered into [place], patient began care under the provider at [facility] Dr. Juleen China will be removed as PCP due to SNF admission.   Attempted to contact patient and daughter to redirect their concerns to the provider at [facility]. Awaiting call back.   Dossie Arbour (daughter) [phone] -- confirmed on DPR.

## 2017-06-25 DIAGNOSIS — D631 Anemia in chronic kidney disease: Secondary | ICD-10-CM | POA: Diagnosis not present

## 2017-06-25 DIAGNOSIS — E119 Type 2 diabetes mellitus without complications: Secondary | ICD-10-CM | POA: Diagnosis not present

## 2017-06-25 DIAGNOSIS — T8249XD Other complication of vascular dialysis catheter, subsequent encounter: Secondary | ICD-10-CM | POA: Diagnosis not present

## 2017-06-25 DIAGNOSIS — N186 End stage renal disease: Secondary | ICD-10-CM | POA: Diagnosis not present

## 2017-06-25 DIAGNOSIS — N2581 Secondary hyperparathyroidism of renal origin: Secondary | ICD-10-CM | POA: Diagnosis not present

## 2017-06-25 DIAGNOSIS — R293 Abnormal posture: Secondary | ICD-10-CM | POA: Diagnosis not present

## 2017-06-26 ENCOUNTER — Telehealth: Payer: Self-pay

## 2017-06-26 ENCOUNTER — Encounter: Payer: Self-pay | Admitting: Surgery

## 2017-06-26 DIAGNOSIS — I739 Peripheral vascular disease, unspecified: Secondary | ICD-10-CM | POA: Diagnosis not present

## 2017-06-26 DIAGNOSIS — I10 Essential (primary) hypertension: Secondary | ICD-10-CM | POA: Diagnosis not present

## 2017-06-26 DIAGNOSIS — E119 Type 2 diabetes mellitus without complications: Secondary | ICD-10-CM | POA: Diagnosis not present

## 2017-06-26 DIAGNOSIS — Z89411 Acquired absence of right great toe: Secondary | ICD-10-CM | POA: Diagnosis not present

## 2017-06-26 DIAGNOSIS — R293 Abnormal posture: Secondary | ICD-10-CM | POA: Diagnosis not present

## 2017-06-26 DIAGNOSIS — N186 End stage renal disease: Secondary | ICD-10-CM | POA: Diagnosis not present

## 2017-06-26 NOTE — Telephone Encounter (Signed)
Made multiple attempts to contact Deborah Hendricks at [facility]. She called left a VM on 8/14 with questions concerning wound care orders. Multiple messages have been left although she has made an attempt to return my call each time I call her back she seems to be in a meeting.

## 2017-06-27 DIAGNOSIS — R293 Abnormal posture: Secondary | ICD-10-CM | POA: Diagnosis not present

## 2017-06-27 DIAGNOSIS — N186 End stage renal disease: Secondary | ICD-10-CM | POA: Diagnosis not present

## 2017-06-28 DIAGNOSIS — N186 End stage renal disease: Secondary | ICD-10-CM | POA: Diagnosis not present

## 2017-06-28 DIAGNOSIS — R293 Abnormal posture: Secondary | ICD-10-CM | POA: Diagnosis not present

## 2017-06-29 DIAGNOSIS — N186 End stage renal disease: Secondary | ICD-10-CM | POA: Diagnosis not present

## 2017-06-29 DIAGNOSIS — T8249XD Other complication of vascular dialysis catheter, subsequent encounter: Secondary | ICD-10-CM | POA: Diagnosis not present

## 2017-06-29 DIAGNOSIS — D631 Anemia in chronic kidney disease: Secondary | ICD-10-CM | POA: Diagnosis not present

## 2017-06-29 DIAGNOSIS — N2581 Secondary hyperparathyroidism of renal origin: Secondary | ICD-10-CM | POA: Diagnosis not present

## 2017-06-29 DIAGNOSIS — E119 Type 2 diabetes mellitus without complications: Secondary | ICD-10-CM | POA: Diagnosis not present

## 2017-07-01 DIAGNOSIS — N186 End stage renal disease: Secondary | ICD-10-CM | POA: Diagnosis not present

## 2017-07-01 DIAGNOSIS — R293 Abnormal posture: Secondary | ICD-10-CM | POA: Diagnosis not present

## 2017-07-02 DIAGNOSIS — N2581 Secondary hyperparathyroidism of renal origin: Secondary | ICD-10-CM | POA: Diagnosis not present

## 2017-07-02 DIAGNOSIS — D631 Anemia in chronic kidney disease: Secondary | ICD-10-CM | POA: Diagnosis not present

## 2017-07-02 DIAGNOSIS — T8249XD Other complication of vascular dialysis catheter, subsequent encounter: Secondary | ICD-10-CM | POA: Diagnosis not present

## 2017-07-02 DIAGNOSIS — E119 Type 2 diabetes mellitus without complications: Secondary | ICD-10-CM | POA: Diagnosis not present

## 2017-07-02 DIAGNOSIS — N186 End stage renal disease: Secondary | ICD-10-CM | POA: Diagnosis not present

## 2017-07-04 DIAGNOSIS — T8249XD Other complication of vascular dialysis catheter, subsequent encounter: Secondary | ICD-10-CM | POA: Diagnosis not present

## 2017-07-04 DIAGNOSIS — N2581 Secondary hyperparathyroidism of renal origin: Secondary | ICD-10-CM | POA: Diagnosis not present

## 2017-07-04 DIAGNOSIS — D631 Anemia in chronic kidney disease: Secondary | ICD-10-CM | POA: Diagnosis not present

## 2017-07-04 DIAGNOSIS — N186 End stage renal disease: Secondary | ICD-10-CM | POA: Diagnosis not present

## 2017-07-04 DIAGNOSIS — R293 Abnormal posture: Secondary | ICD-10-CM | POA: Diagnosis not present

## 2017-07-04 DIAGNOSIS — E119 Type 2 diabetes mellitus without complications: Secondary | ICD-10-CM | POA: Diagnosis not present

## 2017-07-05 DIAGNOSIS — N186 End stage renal disease: Secondary | ICD-10-CM | POA: Diagnosis not present

## 2017-07-05 DIAGNOSIS — R293 Abnormal posture: Secondary | ICD-10-CM | POA: Diagnosis not present

## 2017-07-06 DIAGNOSIS — N186 End stage renal disease: Secondary | ICD-10-CM | POA: Diagnosis not present

## 2017-07-06 DIAGNOSIS — N2581 Secondary hyperparathyroidism of renal origin: Secondary | ICD-10-CM | POA: Diagnosis not present

## 2017-07-06 DIAGNOSIS — D631 Anemia in chronic kidney disease: Secondary | ICD-10-CM | POA: Diagnosis not present

## 2017-07-06 DIAGNOSIS — E119 Type 2 diabetes mellitus without complications: Secondary | ICD-10-CM | POA: Diagnosis not present

## 2017-07-06 DIAGNOSIS — T8249XD Other complication of vascular dialysis catheter, subsequent encounter: Secondary | ICD-10-CM | POA: Diagnosis not present

## 2017-07-08 ENCOUNTER — Ambulatory Visit (INDEPENDENT_AMBULATORY_CARE_PROVIDER_SITE_OTHER): Payer: Self-pay | Admitting: Surgery

## 2017-07-08 VITALS — BP 150/71 | HR 70

## 2017-07-08 DIAGNOSIS — R293 Abnormal posture: Secondary | ICD-10-CM | POA: Diagnosis not present

## 2017-07-08 DIAGNOSIS — L97911 Non-pressure chronic ulcer of unspecified part of right lower leg limited to breakdown of skin: Secondary | ICD-10-CM

## 2017-07-08 DIAGNOSIS — N186 End stage renal disease: Secondary | ICD-10-CM | POA: Diagnosis not present

## 2017-07-08 NOTE — Progress Notes (Signed)
Patient name: Deborah Hendricks MRN: [id number] DOB: [birth year] Sex: female  REASON FOR VISIT:     post op  HISTORY OF PRESENT ILLNESS:   Deborah Hendricks a 71 y.o.femalewho returns today for evaluation of a right leg ulcer. This has been present for many weeks. She is status post left above-knee amputation on 12/15/2015. She also suffers from end-stage renal disease. She has undergone a basilic vein procedure which has not developed. She has a catheter in her right thigh. He has a history of stroke. She is on anticoagulation. She suffers from diabetes with multiple complications.  On 11/27/2016 she underwent atherectomy and drug coated balloon angioplasty of the superficial femoral and popliteal artery on the right. She also had balloon angioplasty of the stenosis within the tibioperoneal trunk and proximal peroneal artery. She has single-vessel runoff via the peroneal artery.  On 06/13/2017 showing drug coated balloon and plasty the right popliteal artery and then on 06/19/2017 she underwent amputation of the right great toe which was chronically infected.  She is back today for follow-up with no complaints  CURRENT MEDICATIONS:    Current Outpatient Prescriptions  Medication Sig Dispense Refill  . Amino Acids-Protein Hydrolys (FEEDING SUPPLEMENT, PRO-STAT SUGAR FREE 64,) LIQD Take 30 mLs by mouth 2 (two) times daily. (0900 & 2100)    . apixaban (ELIQUIS) 2.5 MG TABS tablet Take 1 tablet (2.5 mg total) by mouth 2 (two) times daily. 60 tablet 11  . aspirin EC 81 MG tablet Take 81 mg by mouth daily.    [name] atorvastatin (LIPITOR) 40 MG tablet Take 1 tablet (40 mg total) by mouth daily. (Patient taking differently: Take 40 mg by mouth daily. (0900)) 30 tablet 3  . calcium acetate (PHOSLO) 667 MG capsule Take 2,001 mg by mouth 3 (three) times daily with meals.     . carvedilol (COREG) 3.125 MG tablet Take 1 tablet (3.125 mg total) by mouth 2 (two)  times daily with a meal. 60 tablet 1  . citalopram (CELEXA) 10 MG tablet Take 10 mg by mouth daily. (0900)  3  . esomeprazole (NEXIUM) 20 MG capsule Take 1 capsule (20 mg total) by mouth daily. 90 capsule 3  . gabapentin (NEURONTIN) 100 MG capsule Take 1 capsule (100 mg total) by mouth at bedtime.    . insulin aspart (NOVOLOG) 100 UNIT/ML injection Inject 2-10 Units into the skin 3 (three) times daily as needed for high blood sugar. 151-200=2 units, 201-250=4 units, 251-300=6 units, 301-350=8 units, 351-400=10 units, >400=10 units and Call MD    . multivitamin (RENA-VIT) TABS tablet Take 1 tablet by mouth daily. (0900)    . Nutritional Supplements (FEEDING SUPPLEMENT, NEPRO CARB STEADY,) LIQD Take 237 mLs by mouth daily with lunch.    . ondansetron (ZOFRAN ODT) 4 MG disintegrating tablet Take 1 tablet (4 mg total) by mouth every 8 (eight) hours as needed for nausea or vomiting. 20 tablet 0  . polyethylene glycol powder (GLYCOLAX/MIRALAX) powder Take 17 g by mouth at bedtime. HOLD FOR LOOSE STOOLS     . psyllium (REGULOID) 0.52 g capsule One po tid prn constipation. (Patient taking differently: Take 0.52 g by mouth 3 (three) times daily as needed (for constipation.). ) 90 capsule 2   No current facility-administered medications for this visit.     REVIEW OF SYSTEMS:   [X]  denotes positive finding, [ ]  denotes negative finding Cardiac  Comments:  Chest pain or chest pressure:    Shortness of breath upon exertion:  Short of breath when lying flat:    Irregular heart rhythm:    Constitutional    Fever or chills:      PHYSICAL EXAM:   Vitals:   07/08/17 1111  BP: (!) 150/71  Pulse: 70  SpO2: 100%    GENERAL: The patient is a well-nourished female, in no acute distress. The vital signs are documented above. CARDIOVASCULAR: There is a regular rate and rhythm. PULMONARY: Non-labored respirations Amputation site has healed nicely  STUDIES:   None   MEDICAL ISSUES:   Sutures  will be removed today Follow-up in 3 months with duplex to evaluate her most recent intervention  [name], MD [facility] [phone] Pager [phone]

## 2017-07-09 DIAGNOSIS — R293 Abnormal posture: Secondary | ICD-10-CM | POA: Diagnosis not present

## 2017-07-09 DIAGNOSIS — N2581 Secondary hyperparathyroidism of renal origin: Secondary | ICD-10-CM | POA: Diagnosis not present

## 2017-07-09 DIAGNOSIS — E119 Type 2 diabetes mellitus without complications: Secondary | ICD-10-CM | POA: Diagnosis not present

## 2017-07-09 DIAGNOSIS — N186 End stage renal disease: Secondary | ICD-10-CM | POA: Diagnosis not present

## 2017-07-09 DIAGNOSIS — T8249XD Other complication of vascular dialysis catheter, subsequent encounter: Secondary | ICD-10-CM | POA: Diagnosis not present

## 2017-07-09 DIAGNOSIS — D631 Anemia in chronic kidney disease: Secondary | ICD-10-CM | POA: Diagnosis not present

## 2017-07-10 DIAGNOSIS — N186 End stage renal disease: Secondary | ICD-10-CM | POA: Diagnosis not present

## 2017-07-10 DIAGNOSIS — R293 Abnormal posture: Secondary | ICD-10-CM | POA: Diagnosis not present

## 2017-07-11 DIAGNOSIS — N186 End stage renal disease: Secondary | ICD-10-CM | POA: Diagnosis not present

## 2017-07-11 DIAGNOSIS — D631 Anemia in chronic kidney disease: Secondary | ICD-10-CM | POA: Diagnosis not present

## 2017-07-11 DIAGNOSIS — E119 Type 2 diabetes mellitus without complications: Secondary | ICD-10-CM | POA: Diagnosis not present

## 2017-07-11 DIAGNOSIS — R293 Abnormal posture: Secondary | ICD-10-CM | POA: Diagnosis not present

## 2017-07-11 DIAGNOSIS — T8249XD Other complication of vascular dialysis catheter, subsequent encounter: Secondary | ICD-10-CM | POA: Diagnosis not present

## 2017-07-11 DIAGNOSIS — N2581 Secondary hyperparathyroidism of renal origin: Secondary | ICD-10-CM | POA: Diagnosis not present

## 2017-07-12 DIAGNOSIS — E1122 Type 2 diabetes mellitus with diabetic chronic kidney disease: Secondary | ICD-10-CM | POA: Diagnosis not present

## 2017-07-12 DIAGNOSIS — R293 Abnormal posture: Secondary | ICD-10-CM | POA: Diagnosis not present

## 2017-07-12 DIAGNOSIS — Z992 Dependence on renal dialysis: Secondary | ICD-10-CM | POA: Diagnosis not present

## 2017-07-12 DIAGNOSIS — N186 End stage renal disease: Secondary | ICD-10-CM | POA: Diagnosis not present

## 2017-07-13 DIAGNOSIS — E119 Type 2 diabetes mellitus without complications: Secondary | ICD-10-CM | POA: Diagnosis not present

## 2017-07-13 DIAGNOSIS — N2581 Secondary hyperparathyroidism of renal origin: Secondary | ICD-10-CM | POA: Diagnosis not present

## 2017-07-13 DIAGNOSIS — D631 Anemia in chronic kidney disease: Secondary | ICD-10-CM | POA: Diagnosis not present

## 2017-07-13 DIAGNOSIS — N186 End stage renal disease: Secondary | ICD-10-CM | POA: Diagnosis not present

## 2017-07-13 DIAGNOSIS — T8249XD Other complication of vascular dialysis catheter, subsequent encounter: Secondary | ICD-10-CM | POA: Diagnosis not present

## 2017-07-15 DIAGNOSIS — Z7409 Other reduced mobility: Secondary | ICD-10-CM | POA: Diagnosis not present

## 2017-07-15 DIAGNOSIS — R278 Other lack of coordination: Secondary | ICD-10-CM | POA: Diagnosis not present

## 2017-07-15 DIAGNOSIS — S78111S Complete traumatic amputation at level between right hip and knee, sequela: Secondary | ICD-10-CM | POA: Diagnosis not present

## 2017-07-15 DIAGNOSIS — M6281 Muscle weakness (generalized): Secondary | ICD-10-CM | POA: Diagnosis not present

## 2017-07-16 DIAGNOSIS — E119 Type 2 diabetes mellitus without complications: Secondary | ICD-10-CM | POA: Diagnosis not present

## 2017-07-16 DIAGNOSIS — N2581 Secondary hyperparathyroidism of renal origin: Secondary | ICD-10-CM | POA: Diagnosis not present

## 2017-07-16 DIAGNOSIS — D631 Anemia in chronic kidney disease: Secondary | ICD-10-CM | POA: Diagnosis not present

## 2017-07-16 DIAGNOSIS — M6281 Muscle weakness (generalized): Secondary | ICD-10-CM | POA: Diagnosis not present

## 2017-07-16 DIAGNOSIS — S78111S Complete traumatic amputation at level between right hip and knee, sequela: Secondary | ICD-10-CM | POA: Diagnosis not present

## 2017-07-16 DIAGNOSIS — R278 Other lack of coordination: Secondary | ICD-10-CM | POA: Diagnosis not present

## 2017-07-16 DIAGNOSIS — T8249XD Other complication of vascular dialysis catheter, subsequent encounter: Secondary | ICD-10-CM | POA: Diagnosis not present

## 2017-07-16 DIAGNOSIS — N186 End stage renal disease: Secondary | ICD-10-CM | POA: Diagnosis not present

## 2017-07-16 DIAGNOSIS — Z7409 Other reduced mobility: Secondary | ICD-10-CM | POA: Diagnosis not present

## 2017-07-17 DIAGNOSIS — R278 Other lack of coordination: Secondary | ICD-10-CM | POA: Diagnosis not present

## 2017-07-17 DIAGNOSIS — S78111S Complete traumatic amputation at level between right hip and knee, sequela: Secondary | ICD-10-CM | POA: Diagnosis not present

## 2017-07-17 DIAGNOSIS — M6281 Muscle weakness (generalized): Secondary | ICD-10-CM | POA: Diagnosis not present

## 2017-07-17 DIAGNOSIS — Z7409 Other reduced mobility: Secondary | ICD-10-CM | POA: Diagnosis not present

## 2017-07-17 NOTE — Addendum Note (Signed)
Addended by: Lianne Cure A on: 07/17/2017 09:11 AM   Modules accepted: Orders

## 2017-07-18 DIAGNOSIS — N186 End stage renal disease: Secondary | ICD-10-CM | POA: Diagnosis not present

## 2017-07-18 DIAGNOSIS — E119 Type 2 diabetes mellitus without complications: Secondary | ICD-10-CM | POA: Diagnosis not present

## 2017-07-18 DIAGNOSIS — D631 Anemia in chronic kidney disease: Secondary | ICD-10-CM | POA: Diagnosis not present

## 2017-07-18 DIAGNOSIS — T8249XD Other complication of vascular dialysis catheter, subsequent encounter: Secondary | ICD-10-CM | POA: Diagnosis not present

## 2017-07-18 DIAGNOSIS — N2581 Secondary hyperparathyroidism of renal origin: Secondary | ICD-10-CM | POA: Diagnosis not present

## 2017-07-19 DIAGNOSIS — R278 Other lack of coordination: Secondary | ICD-10-CM | POA: Diagnosis not present

## 2017-07-19 DIAGNOSIS — S78111S Complete traumatic amputation at level between right hip and knee, sequela: Secondary | ICD-10-CM | POA: Diagnosis not present

## 2017-07-19 DIAGNOSIS — M6281 Muscle weakness (generalized): Secondary | ICD-10-CM | POA: Diagnosis not present

## 2017-07-19 DIAGNOSIS — Z7409 Other reduced mobility: Secondary | ICD-10-CM | POA: Diagnosis not present

## 2017-07-20 DIAGNOSIS — N2581 Secondary hyperparathyroidism of renal origin: Secondary | ICD-10-CM | POA: Diagnosis not present

## 2017-07-20 DIAGNOSIS — E119 Type 2 diabetes mellitus without complications: Secondary | ICD-10-CM | POA: Diagnosis not present

## 2017-07-20 DIAGNOSIS — N186 End stage renal disease: Secondary | ICD-10-CM | POA: Diagnosis not present

## 2017-07-20 DIAGNOSIS — D631 Anemia in chronic kidney disease: Secondary | ICD-10-CM | POA: Diagnosis not present

## 2017-07-20 DIAGNOSIS — T8249XD Other complication of vascular dialysis catheter, subsequent encounter: Secondary | ICD-10-CM | POA: Diagnosis not present

## 2017-07-22 DIAGNOSIS — M6281 Muscle weakness (generalized): Secondary | ICD-10-CM | POA: Diagnosis not present

## 2017-07-22 DIAGNOSIS — Z7409 Other reduced mobility: Secondary | ICD-10-CM | POA: Diagnosis not present

## 2017-07-22 DIAGNOSIS — R278 Other lack of coordination: Secondary | ICD-10-CM | POA: Diagnosis not present

## 2017-07-22 DIAGNOSIS — S78111S Complete traumatic amputation at level between right hip and knee, sequela: Secondary | ICD-10-CM | POA: Diagnosis not present

## 2017-07-23 DIAGNOSIS — E119 Type 2 diabetes mellitus without complications: Secondary | ICD-10-CM | POA: Diagnosis not present

## 2017-07-23 DIAGNOSIS — R278 Other lack of coordination: Secondary | ICD-10-CM | POA: Diagnosis not present

## 2017-07-23 DIAGNOSIS — N186 End stage renal disease: Secondary | ICD-10-CM | POA: Diagnosis not present

## 2017-07-23 DIAGNOSIS — M6281 Muscle weakness (generalized): Secondary | ICD-10-CM | POA: Diagnosis not present

## 2017-07-23 DIAGNOSIS — D631 Anemia in chronic kidney disease: Secondary | ICD-10-CM | POA: Diagnosis not present

## 2017-07-23 DIAGNOSIS — N2581 Secondary hyperparathyroidism of renal origin: Secondary | ICD-10-CM | POA: Diagnosis not present

## 2017-07-23 DIAGNOSIS — Z7409 Other reduced mobility: Secondary | ICD-10-CM | POA: Diagnosis not present

## 2017-07-23 DIAGNOSIS — T8249XD Other complication of vascular dialysis catheter, subsequent encounter: Secondary | ICD-10-CM | POA: Diagnosis not present

## 2017-07-23 DIAGNOSIS — S78111S Complete traumatic amputation at level between right hip and knee, sequela: Secondary | ICD-10-CM | POA: Diagnosis not present

## 2017-07-24 DIAGNOSIS — Z7409 Other reduced mobility: Secondary | ICD-10-CM | POA: Diagnosis not present

## 2017-07-24 DIAGNOSIS — S78111S Complete traumatic amputation at level between right hip and knee, sequela: Secondary | ICD-10-CM | POA: Diagnosis not present

## 2017-07-24 DIAGNOSIS — M6281 Muscle weakness (generalized): Secondary | ICD-10-CM | POA: Diagnosis not present

## 2017-07-24 DIAGNOSIS — R278 Other lack of coordination: Secondary | ICD-10-CM | POA: Diagnosis not present

## 2017-07-25 DIAGNOSIS — E119 Type 2 diabetes mellitus without complications: Secondary | ICD-10-CM | POA: Diagnosis not present

## 2017-07-25 DIAGNOSIS — Z7409 Other reduced mobility: Secondary | ICD-10-CM | POA: Diagnosis not present

## 2017-07-25 DIAGNOSIS — N2581 Secondary hyperparathyroidism of renal origin: Secondary | ICD-10-CM | POA: Diagnosis not present

## 2017-07-25 DIAGNOSIS — N186 End stage renal disease: Secondary | ICD-10-CM | POA: Diagnosis not present

## 2017-07-25 DIAGNOSIS — R278 Other lack of coordination: Secondary | ICD-10-CM | POA: Diagnosis not present

## 2017-07-25 DIAGNOSIS — T8249XD Other complication of vascular dialysis catheter, subsequent encounter: Secondary | ICD-10-CM | POA: Diagnosis not present

## 2017-07-25 DIAGNOSIS — D631 Anemia in chronic kidney disease: Secondary | ICD-10-CM | POA: Diagnosis not present

## 2017-07-25 DIAGNOSIS — M6281 Muscle weakness (generalized): Secondary | ICD-10-CM | POA: Diagnosis not present

## 2017-07-25 DIAGNOSIS — S78111S Complete traumatic amputation at level between right hip and knee, sequela: Secondary | ICD-10-CM | POA: Diagnosis not present

## 2017-07-26 DIAGNOSIS — R278 Other lack of coordination: Secondary | ICD-10-CM | POA: Diagnosis not present

## 2017-07-26 DIAGNOSIS — M6281 Muscle weakness (generalized): Secondary | ICD-10-CM | POA: Diagnosis not present

## 2017-07-26 DIAGNOSIS — S78111S Complete traumatic amputation at level between right hip and knee, sequela: Secondary | ICD-10-CM | POA: Diagnosis not present

## 2017-07-26 DIAGNOSIS — Z7409 Other reduced mobility: Secondary | ICD-10-CM | POA: Diagnosis not present

## 2017-07-27 DIAGNOSIS — M6281 Muscle weakness (generalized): Secondary | ICD-10-CM | POA: Diagnosis not present

## 2017-07-27 DIAGNOSIS — Z7409 Other reduced mobility: Secondary | ICD-10-CM | POA: Diagnosis not present

## 2017-07-27 DIAGNOSIS — S78111S Complete traumatic amputation at level between right hip and knee, sequela: Secondary | ICD-10-CM | POA: Diagnosis not present

## 2017-07-27 DIAGNOSIS — R278 Other lack of coordination: Secondary | ICD-10-CM | POA: Diagnosis not present

## 2017-07-29 DIAGNOSIS — M6281 Muscle weakness (generalized): Secondary | ICD-10-CM | POA: Diagnosis not present

## 2017-07-29 DIAGNOSIS — S78111S Complete traumatic amputation at level between right hip and knee, sequela: Secondary | ICD-10-CM | POA: Diagnosis not present

## 2017-07-29 DIAGNOSIS — Z7409 Other reduced mobility: Secondary | ICD-10-CM | POA: Diagnosis not present

## 2017-07-29 DIAGNOSIS — R278 Other lack of coordination: Secondary | ICD-10-CM | POA: Diagnosis not present

## 2017-07-30 DIAGNOSIS — N2581 Secondary hyperparathyroidism of renal origin: Secondary | ICD-10-CM | POA: Diagnosis not present

## 2017-07-30 DIAGNOSIS — E119 Type 2 diabetes mellitus without complications: Secondary | ICD-10-CM | POA: Diagnosis not present

## 2017-07-30 DIAGNOSIS — D631 Anemia in chronic kidney disease: Secondary | ICD-10-CM | POA: Diagnosis not present

## 2017-07-30 DIAGNOSIS — T8249XD Other complication of vascular dialysis catheter, subsequent encounter: Secondary | ICD-10-CM | POA: Diagnosis not present

## 2017-07-30 DIAGNOSIS — N186 End stage renal disease: Secondary | ICD-10-CM | POA: Diagnosis not present

## 2017-07-31 DIAGNOSIS — S78111S Complete traumatic amputation at level between right hip and knee, sequela: Secondary | ICD-10-CM | POA: Diagnosis not present

## 2017-07-31 DIAGNOSIS — R278 Other lack of coordination: Secondary | ICD-10-CM | POA: Diagnosis not present

## 2017-07-31 DIAGNOSIS — Z7409 Other reduced mobility: Secondary | ICD-10-CM | POA: Diagnosis not present

## 2017-07-31 DIAGNOSIS — M6281 Muscle weakness (generalized): Secondary | ICD-10-CM | POA: Diagnosis not present

## 2017-08-01 DIAGNOSIS — E119 Type 2 diabetes mellitus without complications: Secondary | ICD-10-CM | POA: Diagnosis not present

## 2017-08-01 DIAGNOSIS — N2581 Secondary hyperparathyroidism of renal origin: Secondary | ICD-10-CM | POA: Diagnosis not present

## 2017-08-01 DIAGNOSIS — T8249XD Other complication of vascular dialysis catheter, subsequent encounter: Secondary | ICD-10-CM | POA: Diagnosis not present

## 2017-08-01 DIAGNOSIS — D631 Anemia in chronic kidney disease: Secondary | ICD-10-CM | POA: Diagnosis not present

## 2017-08-01 DIAGNOSIS — N186 End stage renal disease: Secondary | ICD-10-CM | POA: Diagnosis not present

## 2017-08-03 DIAGNOSIS — E119 Type 2 diabetes mellitus without complications: Secondary | ICD-10-CM | POA: Diagnosis not present

## 2017-08-03 DIAGNOSIS — N2581 Secondary hyperparathyroidism of renal origin: Secondary | ICD-10-CM | POA: Diagnosis not present

## 2017-08-03 DIAGNOSIS — T8249XD Other complication of vascular dialysis catheter, subsequent encounter: Secondary | ICD-10-CM | POA: Diagnosis not present

## 2017-08-03 DIAGNOSIS — D631 Anemia in chronic kidney disease: Secondary | ICD-10-CM | POA: Diagnosis not present

## 2017-08-03 DIAGNOSIS — N186 End stage renal disease: Secondary | ICD-10-CM | POA: Diagnosis not present

## 2017-08-05 DIAGNOSIS — M6281 Muscle weakness (generalized): Secondary | ICD-10-CM | POA: Diagnosis not present

## 2017-08-05 DIAGNOSIS — Z7409 Other reduced mobility: Secondary | ICD-10-CM | POA: Diagnosis not present

## 2017-08-05 DIAGNOSIS — R278 Other lack of coordination: Secondary | ICD-10-CM | POA: Diagnosis not present

## 2017-08-05 DIAGNOSIS — S78111S Complete traumatic amputation at level between right hip and knee, sequela: Secondary | ICD-10-CM | POA: Diagnosis not present

## 2017-08-06 DIAGNOSIS — N186 End stage renal disease: Secondary | ICD-10-CM | POA: Diagnosis not present

## 2017-08-06 DIAGNOSIS — D631 Anemia in chronic kidney disease: Secondary | ICD-10-CM | POA: Diagnosis not present

## 2017-08-06 DIAGNOSIS — E119 Type 2 diabetes mellitus without complications: Secondary | ICD-10-CM | POA: Diagnosis not present

## 2017-08-06 DIAGNOSIS — T8249XD Other complication of vascular dialysis catheter, subsequent encounter: Secondary | ICD-10-CM | POA: Diagnosis not present

## 2017-08-06 DIAGNOSIS — N2581 Secondary hyperparathyroidism of renal origin: Secondary | ICD-10-CM | POA: Diagnosis not present

## 2017-08-07 DIAGNOSIS — R278 Other lack of coordination: Secondary | ICD-10-CM | POA: Diagnosis not present

## 2017-08-07 DIAGNOSIS — M6281 Muscle weakness (generalized): Secondary | ICD-10-CM | POA: Diagnosis not present

## 2017-08-07 DIAGNOSIS — Z7409 Other reduced mobility: Secondary | ICD-10-CM | POA: Diagnosis not present

## 2017-08-07 DIAGNOSIS — S78111S Complete traumatic amputation at level between right hip and knee, sequela: Secondary | ICD-10-CM | POA: Diagnosis not present

## 2017-08-08 DIAGNOSIS — D631 Anemia in chronic kidney disease: Secondary | ICD-10-CM | POA: Diagnosis not present

## 2017-08-08 DIAGNOSIS — T8249XD Other complication of vascular dialysis catheter, subsequent encounter: Secondary | ICD-10-CM | POA: Diagnosis not present

## 2017-08-08 DIAGNOSIS — N186 End stage renal disease: Secondary | ICD-10-CM | POA: Diagnosis not present

## 2017-08-08 DIAGNOSIS — N2581 Secondary hyperparathyroidism of renal origin: Secondary | ICD-10-CM | POA: Diagnosis not present

## 2017-08-08 DIAGNOSIS — E119 Type 2 diabetes mellitus without complications: Secondary | ICD-10-CM | POA: Diagnosis not present

## 2017-08-09 DIAGNOSIS — M6281 Muscle weakness (generalized): Secondary | ICD-10-CM | POA: Diagnosis not present

## 2017-08-09 DIAGNOSIS — Z7409 Other reduced mobility: Secondary | ICD-10-CM | POA: Diagnosis not present

## 2017-08-09 DIAGNOSIS — S78111S Complete traumatic amputation at level between right hip and knee, sequela: Secondary | ICD-10-CM | POA: Diagnosis not present

## 2017-08-09 DIAGNOSIS — R278 Other lack of coordination: Secondary | ICD-10-CM | POA: Diagnosis not present

## 2017-08-10 DIAGNOSIS — T8249XD Other complication of vascular dialysis catheter, subsequent encounter: Secondary | ICD-10-CM | POA: Diagnosis not present

## 2017-08-10 DIAGNOSIS — E119 Type 2 diabetes mellitus without complications: Secondary | ICD-10-CM | POA: Diagnosis not present

## 2017-08-10 DIAGNOSIS — N2581 Secondary hyperparathyroidism of renal origin: Secondary | ICD-10-CM | POA: Diagnosis not present

## 2017-08-10 DIAGNOSIS — D631 Anemia in chronic kidney disease: Secondary | ICD-10-CM | POA: Diagnosis not present

## 2017-08-10 DIAGNOSIS — N186 End stage renal disease: Secondary | ICD-10-CM | POA: Diagnosis not present

## 2017-08-11 DIAGNOSIS — Z992 Dependence on renal dialysis: Secondary | ICD-10-CM | POA: Diagnosis not present

## 2017-08-11 DIAGNOSIS — N186 End stage renal disease: Secondary | ICD-10-CM | POA: Diagnosis not present

## 2017-08-11 DIAGNOSIS — E1122 Type 2 diabetes mellitus with diabetic chronic kidney disease: Secondary | ICD-10-CM | POA: Diagnosis not present

## 2017-08-12 DIAGNOSIS — S78111S Complete traumatic amputation at level between right hip and knee, sequela: Secondary | ICD-10-CM | POA: Diagnosis not present

## 2017-08-12 DIAGNOSIS — R278 Other lack of coordination: Secondary | ICD-10-CM | POA: Diagnosis not present

## 2017-08-12 DIAGNOSIS — Z7409 Other reduced mobility: Secondary | ICD-10-CM | POA: Diagnosis not present

## 2017-08-13 DIAGNOSIS — D631 Anemia in chronic kidney disease: Secondary | ICD-10-CM | POA: Diagnosis not present

## 2017-08-13 DIAGNOSIS — E119 Type 2 diabetes mellitus without complications: Secondary | ICD-10-CM | POA: Diagnosis not present

## 2017-08-13 DIAGNOSIS — D500 Iron deficiency anemia secondary to blood loss (chronic): Secondary | ICD-10-CM | POA: Diagnosis not present

## 2017-08-13 DIAGNOSIS — N2581 Secondary hyperparathyroidism of renal origin: Secondary | ICD-10-CM | POA: Diagnosis not present

## 2017-08-13 DIAGNOSIS — N186 End stage renal disease: Secondary | ICD-10-CM | POA: Diagnosis not present

## 2017-08-16 DIAGNOSIS — S78111S Complete traumatic amputation at level between right hip and knee, sequela: Secondary | ICD-10-CM | POA: Diagnosis not present

## 2017-08-16 DIAGNOSIS — R278 Other lack of coordination: Secondary | ICD-10-CM | POA: Diagnosis not present

## 2017-08-16 DIAGNOSIS — Z7409 Other reduced mobility: Secondary | ICD-10-CM | POA: Diagnosis not present

## 2017-08-17 DIAGNOSIS — D631 Anemia in chronic kidney disease: Secondary | ICD-10-CM | POA: Diagnosis not present

## 2017-08-17 DIAGNOSIS — N186 End stage renal disease: Secondary | ICD-10-CM | POA: Diagnosis not present

## 2017-08-17 DIAGNOSIS — E119 Type 2 diabetes mellitus without complications: Secondary | ICD-10-CM | POA: Diagnosis not present

## 2017-08-17 DIAGNOSIS — D500 Iron deficiency anemia secondary to blood loss (chronic): Secondary | ICD-10-CM | POA: Diagnosis not present

## 2017-08-17 DIAGNOSIS — N2581 Secondary hyperparathyroidism of renal origin: Secondary | ICD-10-CM | POA: Diagnosis not present

## 2017-08-19 DIAGNOSIS — R278 Other lack of coordination: Secondary | ICD-10-CM | POA: Diagnosis not present

## 2017-08-19 DIAGNOSIS — S78111S Complete traumatic amputation at level between right hip and knee, sequela: Secondary | ICD-10-CM | POA: Diagnosis not present

## 2017-08-19 DIAGNOSIS — Z7409 Other reduced mobility: Secondary | ICD-10-CM | POA: Diagnosis not present

## 2017-08-20 DIAGNOSIS — N2581 Secondary hyperparathyroidism of renal origin: Secondary | ICD-10-CM | POA: Diagnosis not present

## 2017-08-20 DIAGNOSIS — D500 Iron deficiency anemia secondary to blood loss (chronic): Secondary | ICD-10-CM | POA: Diagnosis not present

## 2017-08-20 DIAGNOSIS — N186 End stage renal disease: Secondary | ICD-10-CM | POA: Diagnosis not present

## 2017-08-20 DIAGNOSIS — D631 Anemia in chronic kidney disease: Secondary | ICD-10-CM | POA: Diagnosis not present

## 2017-08-20 DIAGNOSIS — E119 Type 2 diabetes mellitus without complications: Secondary | ICD-10-CM | POA: Diagnosis not present

## 2017-08-21 DIAGNOSIS — I10 Essential (primary) hypertension: Secondary | ICD-10-CM | POA: Diagnosis not present

## 2017-08-21 DIAGNOSIS — E119 Type 2 diabetes mellitus without complications: Secondary | ICD-10-CM | POA: Diagnosis not present

## 2017-08-21 DIAGNOSIS — R278 Other lack of coordination: Secondary | ICD-10-CM | POA: Diagnosis not present

## 2017-08-21 DIAGNOSIS — S78111S Complete traumatic amputation at level between right hip and knee, sequela: Secondary | ICD-10-CM | POA: Diagnosis not present

## 2017-08-21 DIAGNOSIS — N186 End stage renal disease: Secondary | ICD-10-CM | POA: Diagnosis not present

## 2017-08-21 DIAGNOSIS — I739 Peripheral vascular disease, unspecified: Secondary | ICD-10-CM | POA: Diagnosis not present

## 2017-08-21 DIAGNOSIS — Z7409 Other reduced mobility: Secondary | ICD-10-CM | POA: Diagnosis not present

## 2017-08-22 DIAGNOSIS — D500 Iron deficiency anemia secondary to blood loss (chronic): Secondary | ICD-10-CM | POA: Diagnosis not present

## 2017-08-22 DIAGNOSIS — E119 Type 2 diabetes mellitus without complications: Secondary | ICD-10-CM | POA: Diagnosis not present

## 2017-08-22 DIAGNOSIS — D631 Anemia in chronic kidney disease: Secondary | ICD-10-CM | POA: Diagnosis not present

## 2017-08-22 DIAGNOSIS — N2581 Secondary hyperparathyroidism of renal origin: Secondary | ICD-10-CM | POA: Diagnosis not present

## 2017-08-22 DIAGNOSIS — N186 End stage renal disease: Secondary | ICD-10-CM | POA: Diagnosis not present

## 2017-08-23 DIAGNOSIS — R278 Other lack of coordination: Secondary | ICD-10-CM | POA: Diagnosis not present

## 2017-08-23 DIAGNOSIS — Z7409 Other reduced mobility: Secondary | ICD-10-CM | POA: Diagnosis not present

## 2017-08-23 DIAGNOSIS — S78111S Complete traumatic amputation at level between right hip and knee, sequela: Secondary | ICD-10-CM | POA: Diagnosis not present

## 2017-08-24 DIAGNOSIS — E119 Type 2 diabetes mellitus without complications: Secondary | ICD-10-CM | POA: Diagnosis not present

## 2017-08-24 DIAGNOSIS — D500 Iron deficiency anemia secondary to blood loss (chronic): Secondary | ICD-10-CM | POA: Diagnosis not present

## 2017-08-24 DIAGNOSIS — N186 End stage renal disease: Secondary | ICD-10-CM | POA: Diagnosis not present

## 2017-08-24 DIAGNOSIS — D631 Anemia in chronic kidney disease: Secondary | ICD-10-CM | POA: Diagnosis not present

## 2017-08-24 DIAGNOSIS — N2581 Secondary hyperparathyroidism of renal origin: Secondary | ICD-10-CM | POA: Diagnosis not present

## 2017-08-26 DIAGNOSIS — Z7409 Other reduced mobility: Secondary | ICD-10-CM | POA: Diagnosis not present

## 2017-08-26 DIAGNOSIS — S78111S Complete traumatic amputation at level between right hip and knee, sequela: Secondary | ICD-10-CM | POA: Diagnosis not present

## 2017-08-26 DIAGNOSIS — R278 Other lack of coordination: Secondary | ICD-10-CM | POA: Diagnosis not present

## 2017-08-27 DIAGNOSIS — E119 Type 2 diabetes mellitus without complications: Secondary | ICD-10-CM | POA: Diagnosis not present

## 2017-08-27 DIAGNOSIS — D631 Anemia in chronic kidney disease: Secondary | ICD-10-CM | POA: Diagnosis not present

## 2017-08-27 DIAGNOSIS — N186 End stage renal disease: Secondary | ICD-10-CM | POA: Diagnosis not present

## 2017-08-27 DIAGNOSIS — D500 Iron deficiency anemia secondary to blood loss (chronic): Secondary | ICD-10-CM | POA: Diagnosis not present

## 2017-08-27 DIAGNOSIS — N2581 Secondary hyperparathyroidism of renal origin: Secondary | ICD-10-CM | POA: Diagnosis not present

## 2017-08-28 DIAGNOSIS — R278 Other lack of coordination: Secondary | ICD-10-CM | POA: Diagnosis not present

## 2017-08-28 DIAGNOSIS — S78111S Complete traumatic amputation at level between right hip and knee, sequela: Secondary | ICD-10-CM | POA: Diagnosis not present

## 2017-08-28 DIAGNOSIS — Z7409 Other reduced mobility: Secondary | ICD-10-CM | POA: Diagnosis not present

## 2017-08-29 DIAGNOSIS — E119 Type 2 diabetes mellitus without complications: Secondary | ICD-10-CM | POA: Diagnosis not present

## 2017-08-29 DIAGNOSIS — D500 Iron deficiency anemia secondary to blood loss (chronic): Secondary | ICD-10-CM | POA: Diagnosis not present

## 2017-08-29 DIAGNOSIS — D631 Anemia in chronic kidney disease: Secondary | ICD-10-CM | POA: Diagnosis not present

## 2017-08-29 DIAGNOSIS — N186 End stage renal disease: Secondary | ICD-10-CM | POA: Diagnosis not present

## 2017-08-29 DIAGNOSIS — N2581 Secondary hyperparathyroidism of renal origin: Secondary | ICD-10-CM | POA: Diagnosis not present

## 2017-08-30 ENCOUNTER — Emergency Department (HOSPITAL_COMMUNITY)
Admission: EM | Admit: 2017-08-30 | Discharge: 2017-08-30 | Disposition: A | Discharge status: Home / Self Care | Payer: Medicare Other | Attending: Emergency Medicine | Admitting: Emergency Medicine

## 2017-08-30 ENCOUNTER — Emergency Department (HOSPITAL_COMMUNITY): Payer: Medicare Other

## 2017-08-30 ENCOUNTER — Encounter (HOSPITAL_COMMUNITY): Payer: Self-pay

## 2017-08-30 DIAGNOSIS — Z91040 Latex allergy status: Secondary | ICD-10-CM | POA: Insufficient documentation

## 2017-08-30 DIAGNOSIS — R519 Headache, unspecified: Secondary | ICD-10-CM

## 2017-08-30 DIAGNOSIS — Z79899 Other long term (current) drug therapy: Secondary | ICD-10-CM | POA: Diagnosis not present

## 2017-08-30 DIAGNOSIS — M791 Myalgia, unspecified site: Secondary | ICD-10-CM | POA: Insufficient documentation

## 2017-08-30 DIAGNOSIS — R112 Nausea with vomiting, unspecified: Secondary | ICD-10-CM | POA: Diagnosis not present

## 2017-08-30 DIAGNOSIS — I509 Heart failure, unspecified: Secondary | ICD-10-CM | POA: Insufficient documentation

## 2017-08-30 DIAGNOSIS — R51 Headache: Secondary | ICD-10-CM | POA: Diagnosis not present

## 2017-08-30 DIAGNOSIS — Z794 Long term (current) use of insulin: Secondary | ICD-10-CM | POA: Diagnosis not present

## 2017-08-30 DIAGNOSIS — Z992 Dependence on renal dialysis: Secondary | ICD-10-CM | POA: Insufficient documentation

## 2017-08-30 DIAGNOSIS — N186 End stage renal disease: Secondary | ICD-10-CM | POA: Diagnosis not present

## 2017-08-30 DIAGNOSIS — R1013 Epigastric pain: Secondary | ICD-10-CM | POA: Diagnosis not present

## 2017-08-30 DIAGNOSIS — Z7982 Long term (current) use of aspirin: Secondary | ICD-10-CM | POA: Diagnosis not present

## 2017-08-30 DIAGNOSIS — R9431 Abnormal electrocardiogram [ECG] [EKG]: Secondary | ICD-10-CM | POA: Diagnosis not present

## 2017-08-30 DIAGNOSIS — E1122 Type 2 diabetes mellitus with diabetic chronic kidney disease: Secondary | ICD-10-CM | POA: Diagnosis not present

## 2017-08-30 DIAGNOSIS — R4020 Unspecified coma: Secondary | ICD-10-CM | POA: Diagnosis not present

## 2017-08-30 DIAGNOSIS — I132 Hypertensive heart and chronic kidney disease with heart failure and with stage 5 chronic kidney disease, or end stage renal disease: Secondary | ICD-10-CM | POA: Insufficient documentation

## 2017-08-30 DIAGNOSIS — R030 Elevated blood-pressure reading, without diagnosis of hypertension: Secondary | ICD-10-CM | POA: Diagnosis not present

## 2017-08-30 DIAGNOSIS — Z87891 Personal history of nicotine dependence: Secondary | ICD-10-CM | POA: Diagnosis not present

## 2017-08-30 LAB — COMPREHENSIVE METABOLIC PANEL
ALT: 22 U/L (ref 14–54)
ANION GAP: 10 (ref 5–15)
AST: 22 U/L (ref 15–41)
Albumin: 3.6 g/dL (ref 3.5–5.0)
Alkaline Phosphatase: 180 U/L — ABNORMAL HIGH (ref 38–126)
BUN: 30 mg/dL — ABNORMAL HIGH (ref 6–20)
CALCIUM: 9.6 mg/dL (ref 8.9–10.3)
CHLORIDE: 98 mmol/L — AB (ref 101–111)
CO2: 28 mmol/L (ref 22–32)
Creatinine, Ser: 4.32 mg/dL — ABNORMAL HIGH (ref 0.44–1.00)
GFR calc non Af Amer: 9 mL/min — ABNORMAL LOW (ref >60)
GFR, EST AFRICAN AMERICAN: 11 mL/min — AB (ref >60)
Glucose, Bld: 110 mg/dL — ABNORMAL HIGH (ref 65–99)
Potassium: 4.4 mmol/L (ref 3.5–5.1)
SODIUM: 136 mmol/L (ref 135–145)
Total Bilirubin: 0.5 mg/dL (ref 0.3–1.2)
Total Protein: 7.5 g/dL (ref 6.5–8.1)

## 2017-08-30 LAB — I-STAT TROPONIN, ED: Troponin i, poc: 0.01 ng/mL (ref 0.00–0.08)

## 2017-08-30 LAB — I-STAT CHEM 8, ED
BUN: 30 mg/dL — AB (ref 6–20)
CHLORIDE: 98 mmol/L — AB (ref 101–111)
Calcium, Ion: 1.16 mmol/L (ref 1.15–1.40)
Creatinine, Ser: 4.2 mg/dL — ABNORMAL HIGH (ref 0.44–1.00)
Glucose, Bld: 107 mg/dL — ABNORMAL HIGH (ref 65–99)
HEMATOCRIT: 40 % (ref 36.0–46.0)
Hemoglobin: 13.6 g/dL (ref 12.0–15.0)
Potassium: 4.3 mmol/L (ref 3.5–5.1)
SODIUM: 137 mmol/L (ref 135–145)
TCO2: 28 mmol/L (ref 22–32)

## 2017-08-30 LAB — CBC WITH DIFFERENTIAL/PLATELET
Basophils Absolute: 0 10*3/uL (ref 0.0–0.1)
Basophils Relative: 0 %
EOS ABS: 0.2 10*3/uL (ref 0.0–0.7)
EOS PCT: 2 %
HCT: 38.9 % (ref 36.0–46.0)
Hemoglobin: 12 g/dL (ref 12.0–15.0)
LYMPHS ABS: 1.6 10*3/uL (ref 0.7–4.0)
Lymphocytes Relative: 19 %
MCH: 27.5 pg (ref 26.0–34.0)
MCHC: 30.8 g/dL (ref 30.0–36.0)
MCV: 89 fL (ref 78.0–100.0)
MONOS PCT: 5 %
Monocytes Absolute: 0.4 10*3/uL (ref 0.1–1.0)
Neutro Abs: 6.1 10*3/uL (ref 1.7–7.7)
Neutrophils Relative %: 74 %
PLATELETS: 164 10*3/uL (ref 150–400)
RBC: 4.37 MIL/uL (ref 3.87–5.11)
RDW: 17 % — ABNORMAL HIGH (ref 11.5–15.5)
WBC: 8.3 10*3/uL (ref 4.0–10.5)

## 2017-08-30 LAB — I-STAT VENOUS BLOOD GAS, ED
Acid-Base Excess: 5 mmol/L — ABNORMAL HIGH (ref 0.0–2.0)
Bicarbonate: 30.4 mmol/L — ABNORMAL HIGH (ref 20.0–28.0)
O2 Saturation: 64 %
PCO2 VEN: 46.8 mmHg (ref 44.0–60.0)
TCO2: 32 mmol/L (ref 22–32)
pH, Ven: 7.42 (ref 7.250–7.430)
pO2, Ven: 33 mmHg (ref 32.0–45.0)

## 2017-08-30 LAB — CBG MONITORING, ED: GLUCOSE-CAPILLARY: 60 mg/dL — AB (ref 65–99)

## 2017-08-30 LAB — LIPASE, BLOOD: Lipase: 21 U/L (ref 11–51)

## 2017-08-30 MED ORDER — ONDANSETRON 4 MG PO TBDP: 4.0000 mg | ORAL_TABLET | Freq: Three times a day (TID) | ORAL | 0 refills | Status: DC | PRN

## 2017-08-30 MED ORDER — SODIUM CHLORIDE 0.9 % IV BOLUS (SEPSIS): 250.0000 mL | Freq: Once | INTRAVENOUS | Status: DC

## 2017-08-30 MED ORDER — SODIUM CHLORIDE 0.9 % IV BOLUS (SEPSIS)
500.0000 mL | Freq: Once | INTRAVENOUS | Status: AC
  Administered 2017-08-30: 500 mL via INTRAVENOUS

## 2017-08-30 MED ORDER — DIPHENHYDRAMINE HCL 50 MG/ML IJ SOLN
25.0000 mg | Freq: Once | INTRAMUSCULAR | Status: AC
  Administered 2017-08-30: 25 mg via INTRAVENOUS
  Filled 2017-08-30: qty 1

## 2017-08-30 MED ORDER — IOPAMIDOL (ISOVUE-300) INJECTION 61%
INTRAVENOUS | Status: AC
  Administered 2017-08-30: 100 mL via INTRAVENOUS
  Filled 2017-08-30: qty 100

## 2017-08-30 MED ORDER — SODIUM CHLORIDE 0.9 % IV BOLUS (SEPSIS): 1000.0000 mL | Freq: Once | INTRAVENOUS | Status: DC

## 2017-08-30 MED ORDER — DEXTROSE 5 % IV SOLN: 2.0000 g | Freq: Once | INTRAVENOUS | Status: DC

## 2017-08-30 MED ORDER — PROCHLORPERAZINE EDISYLATE 5 MG/ML IJ SOLN
10.0000 mg | Freq: Once | INTRAMUSCULAR | Status: AC
  Administered 2017-08-30: 10 mg via INTRAVENOUS
  Filled 2017-08-30: qty 2

## 2017-08-30 MED ORDER — VANCOMYCIN HCL IN DEXTROSE 1-5 GM/200ML-% IV SOLN: 1000.0000 mg | Freq: Once | INTRAVENOUS | Status: DC

## 2017-08-30 NOTE — ED Notes (Signed)
IV team at bedside 

## 2017-08-30 NOTE — ED Notes (Signed)
Attempted IV X2 left AC and Left upper arm, unable to obtain

## 2017-08-30 NOTE — ED Notes (Signed)
PTAR contacted for tx back to [facility]

## 2017-08-30 NOTE — ED Triage Notes (Signed)
Pt arrived via [facility] from [facility] where pt and staff report NV X1 day. Pt states "I think I picked up something at dialysis yesterday". She states that she received full dialysis treatment yesterday.

## 2017-08-30 NOTE — ED Notes (Signed)
Pt complaining of headache and advised pt I would check her sugar after she stopped Education officer, museum and advised her sh was feeling bad again.

## 2017-08-30 NOTE — ED Notes (Signed)
Called phlebotomy to draw blood.

## 2017-08-30 NOTE — ED Notes (Signed)
EMS report zofran given PTA

## 2017-08-30 NOTE — ED Notes (Signed)
Patient transported to CT 

## 2017-08-30 NOTE — ED Provider Notes (Signed)
[place] EMERGENCY DEPARTMENT Provider Note   CSN: [id number] Arrival date & time: 08/30/17  1246     History   Chief Complaint Chief Complaint  Patient presents with  . Emesis    HPI Deborah Hendricks is a 71 y.o. female.  HPI  Yesterday after dialysis started vomiting.  Began yesterday afternoon, and has continued every 23minutes vomiting. Vomiting clear.  Can't keep anything down, no solids or fluids.  Has not yet had anything for nausea.  No abdominal pain, does report some epigastric discomfort.  No dyspnea.  Does report some lower chest pain, difficult to describe, with vomiting.  No diarrhea.  Last BM was Wednesday. Not passing flatus.  Doesn't feel abdomen is distended.  Still makes urine, every other night.  No urinary symptoms. Also reports headache that began last night, it began and slowly worsened over time becoming severe this morning. She also had right sided pain but no numbness/weakness.  Denies hx of headaches or vomiting.   Past Medical History:  Diagnosis Date  . Anemia   . Anxiety   . CHF (congestive heart failure) ([place])   . Constipation   . Critical lower limb ischemia   . Depression   . ESRD on dialysis [facility])    "TTS; [place]" (11/27/2016)  . GERD (gastroesophageal reflux disease)   . Heart murmur   . HLD (hyperlipidemia)   . Hypertension   . Memory loss   . Peripheral vascular disease ([place])   . Refusal of blood transfusions as patient is Jehovah's Witness   . Seizures ([place]) 04/2015   s/p CVA.  [name] Sleep apnea    Noncompliant with CPAP.  [name] Stroke ([place]) 2015  . Type II diabetes mellitus [facility])      Patient Active Problem List   Diagnosis Date Noted  . Ischemia of right lower extremity 06/14/2017  . Hyperkalemia 06/05/2017  . Bleeding due to dialysis catheter placement ([place]) 06/05/2017  . History of hysterectomy 03/09/2017  . Former smoker 03/09/2017  . Dysphagia 03/09/2017  . Carotid stenosis 03/09/2017  . Need for  assistance due to reduced mobility 03/09/2017  . Complication of vascular access for dialysis 03/09/2017  . Anemia in chronic kidney disease (CKD) 03/09/2017  . Sleep apnea   . Refusal of blood transfusions as patient is Jehovah's Witness   . Constipation   . GERD (gastroesophageal reflux disease)   . HLD (hyperlipidemia)   . TIA (transient ischemic attack) 02/01/2017  . Diabetic ulcer of toe of right foot associated with diabetes mellitus due to underlying condition, limited to breakdown of skin ([place])   . PVD (peripheral vascular disease) ([place]) 11/27/2016  . Blurry vision   . Asymmetric SNHL (sensorineural hearing loss) 06/12/2016  . Peripheral neuropathy 06/11/2016  . History of pulmonary embolism 06/08/2016  . History of CVA with residual deficit 06/08/2016  . History of borderline personality disorder 04/19/2016  . History of medication noncompliance 04/19/2016  . Paroxysmal atrial fibrillation ([place]) 04/19/2016  . Seizure disorder ([place]) 04/19/2016  . Renovascular hypertension 04/13/2016  . ESRD on dialysis ([place]) 04/11/2016  . Generalized anxiety disorder 04/11/2016  . Major depression, recurrent, chronic ([place]) 04/11/2016  . S/P AKA (above knee amputation) unilateral, left ([place]) 04/11/2016  . PAD (peripheral artery disease) ([place]) 12/15/2015  . Type II diabetes mellitus with neurological manifestations ([place]) 10/21/2015  . Seizures ([place]) 04/13/2015    Past Surgical History:  Procedure Laterality Date  . ABDOMINAL AORTOGRAM W/LOWER EXTREMITY Right 06/13/2017  Procedure: Abdominal Aortogram w/Lower Extremity;  Surgeon: Waynetta Sandy, MD;  Location: [place] CV LAB;  Service: Cardiovascular;  Laterality: Right;  . ABDOMINAL HYSTERECTOMY    . AMPUTATION Left 12/15/2015   Procedure: LEFT ABOVE KNEE AMPUTATION;  Surgeon: Serafina Mitchell, MD;  Location: [place];  Service: Vascular;  Laterality: Left;  . AMPUTATION Right 06/19/2017   Procedure: AMPUTATION right great toe;   Surgeon: Serafina Mitchell, MD;  Location: [place];  Service: Vascular;  Laterality: Right;  . BASCILIC VEIN TRANSPOSITION Right 11/25/2015   Procedure: Right Arm FIRST STAGE BASILIC VEIN TRANSPOSITION;  Surgeon: Serafina Mitchell, MD;  Location: [place];  Service: Vascular;  Laterality: Right;  . BASCILIC VEIN TRANSPOSITION Right 01/19/2016   Procedure: RIGHT SECOND STAGE BASILIC VEIN TRANSPOSITION;  Surgeon: Serafina Mitchell, MD;  Location: [place];  Service: Vascular;  Laterality: Right;  . DILATION AND CURETTAGE OF UTERUS    . EYE SURGERY    . IR FLUORO GUIDE CV LINE RIGHT  06/05/2017  . PERIPHERAL VASCULAR BALLOON ANGIOPLASTY Right 06/13/2017   Procedure: PERIPHERAL VASCULAR BALLOON ANGIOPLASTY;  Surgeon: Waynetta Sandy, MD;  Location: [place] CV LAB;  Service: Cardiovascular;  Laterality: Right;  POPLITEAL/SFA/PERONEAL  . PERIPHERAL VASCULAR CATHETERIZATION Bilateral 11/28/2015   Procedure: Lower Extremity Angiography;  Surgeon: Lorretta Harp, MD;  Location: [place] CV LAB;  Service: Cardiovascular;  Laterality: Bilateral;  . PERIPHERAL VASCULAR CATHETERIZATION N/A 11/28/2015   Procedure: Abdominal Aortogram;  Surgeon: Lorretta Harp, MD;  Location: [place] CV LAB;  Service: Cardiovascular;  Laterality: N/A;  . PERIPHERAL VASCULAR CATHETERIZATION Right 11/27/2016  . PERIPHERAL VASCULAR CATHETERIZATION N/A 11/27/2016   Procedure: Abdominal Aortogram w/Lower Extremity;  Surgeon: Serafina Mitchell, MD;  Location: [place] [facility] LAB;  Service: Cardiovascular;  Laterality: N/A;  . PERIPHERAL VASCULAR CATHETERIZATION N/A 11/27/2016   Procedure: Right arm and central venogram;  Surgeon: Serafina Mitchell, MD;  Location: [place] CV LAB;  Service: Cardiovascular;  Laterality: N/A;  . PERIPHERAL VASCULAR CATHETERIZATION Right 11/27/2016   Procedure: Peripheral Vascular Atherectomy;  Surgeon: Serafina Mitchell, MD;  Location: [place] CV LAB;  Service: Cardiovascular;  Laterality: Right;  Rt     sfa, popliteal, tibioperoneal trunk, peroneal  . PERIPHERAL VASCULAR CATHETERIZATION Right 11/27/2016   Procedure: Peripheral Vascular Balloon Angioplasty;  Surgeon: Serafina Mitchell, MD;  Location: [place] CV LAB;  Service: Cardiovascular;  Laterality: Right;  Tibial peroneal  . RETINOPATHY SURGERY Bilateral   . TOE AMPUTATION Right    2nd, 3rd, 4th, 5th digits  . TONSILLECTOMY      OB History    No data available       Home Medications    Prior to Admission medications   Medication Sig Start Date End Date Taking? Authorizing Provider  Amino Acids-Protein Hydrolys (FEEDING SUPPLEMENT, PRO-STAT SUGAR FREE 64,) LIQD Take 30 mLs by mouth 2 (two) times daily. (0900 & 2100) 06/14/17  Yes Virgina Jock A, PA-C  apixaban (ELIQUIS) 2.5 MG TABS tablet Take 1 tablet (2.5 mg total) by mouth 2 (two) times daily. 06/20/17  Yes Alvia Grove, PA-C  aspirin EC 81 MG tablet Take 81 mg by mouth daily.   Yes [provider]  atorvastatin (LIPITOR) 40 MG tablet Take 1 tablet (40 mg total) by mouth daily. Patient taking differently: Take 40 mg by mouth daily. (0900) 03/07/17  Yes Briscoe Deutscher, DO  calcium acetate (PHOSLO) 667 MG capsule Take 2,001 mg by mouth 3 (  three) times daily with meals.    Yes [provider]  carvedilol (COREG) 3.125 MG tablet Take 1 tablet (3.125 mg total) by mouth 2 (two) times daily with a meal. 07/30/16  Yes Reyne Dumas, MD  citalopram (CELEXA) 10 MG tablet Take 10 mg by mouth daily. (0900) 09/27/16  Yes [provider]  esomeprazole (NEXIUM) 20 MG capsule Take 1 capsule (20 mg total) by mouth daily. 03/11/17  Yes Briscoe Deutscher, DO  gabapentin (NEURONTIN) 100 MG capsule Take 1 capsule (100 mg total) by mouth at bedtime. 06/20/17  Yes Virgina Jock A, PA-C  insulin aspart (NOVOLOG) 100 UNIT/ML injection Inject 2-10 Units into the skin 3 (three) times daily as needed for high blood sugar. 151-200=2 units, 201-250=4 units, 251-300=6 units,  301-350=8 units, 351-400=10 units, >400=10 units and Call MD   Yes [provider]  multivitamin (RENA-VIT) TABS tablet Take 1 tablet by mouth daily. (0900)   Yes [provider]  Nutritional Supplements (FEEDING SUPPLEMENT, NEPRO CARB STEADY,) LIQD Take 237 mLs by mouth daily with lunch. 06/20/17  Yes Virgina Jock A, PA-C  ondansetron (ZOFRAN ODT) 4 MG disintegrating tablet Take 1 tablet (4 mg total) by mouth every 8 (eight) hours as needed for nausea or vomiting. 03/26/17  Yes Horton, Barbette Hair, MD  polyethylene glycol powder (GLYCOLAX/MIRALAX) powder Take 17 g by mouth at bedtime. HOLD FOR LOOSE STOOLS    Yes [provider]  psyllium (REGULOID) 0.52 g capsule One po tid prn constipation. Patient taking differently: Take 0.52 g by mouth 3 (three) times daily as needed (for constipation.).  03/22/17  Yes Briscoe Deutscher, DO    Family History Family History  Problem Relation Age of Onset  . Asthma Mother   . Cancer Sister     Social History Social History  Substance Use Topics  . Smoking status: Former Smoker    Packs/day: 0.25    Years: 51.00    Types: Cigarettes    Quit date: 04/13/2015  . Smokeless tobacco: Never Used  . Alcohol use 0.0 oz/week     Comment: 11/27/2016 "stopped in 04/2015"     Allergies   Penicillins; Sulfa antibiotics; Amoxicillin; and Latex   Review of Systems Review of Systems  Constitutional: Positive for fatigue. Negative for fever.  HENT: Negative for sore throat.   Eyes: Negative for visual disturbance.  Respiratory: Negative for cough and shortness of breath.   Cardiovascular: Positive for chest pain.  Gastrointestinal: Positive for abdominal pain, constipation, nausea and vomiting. Negative for diarrhea.  Genitourinary: Negative for difficulty urinating and dysuria.  Musculoskeletal: Negative for back pain and neck pain.  Skin: Negative for rash.  Neurological: Negative for syncope and headaches.     Physical  Exam Updated Vital Signs BP (!) 168/87   Pulse 80   Temp 98.7 F (37.1 C) (Oral)   Resp 15   Ht 5\' 5"  (1.651 m)   Wt 68 kg (150 lb)   SpO2 96%   BMI 24.96 kg/m   Physical Exam  Constitutional: She is oriented to person, place, and time. She appears well-developed and well-nourished. No distress.  HENT:  Head: Normocephalic and atraumatic.  Eyes: Conjunctivae and EOM are normal.  Neck: Normal range of motion.  Cardiovascular: Normal rate, regular rhythm, normal heart sounds and intact distal pulses.  Exam reveals no gallop and no friction rub.   No murmur heard. Pulmonary/Chest: Effort normal and breath sounds normal. No respiratory distress. She has no wheezes. She has no rales.  Abdominal: Soft. She exhibits no distension. There is tenderness (epigastric). There is no guarding.  Musculoskeletal: She exhibits no edema or tenderness.  Left AKA Mild RLE edema Right femoral catheter in place  Neurological: She is alert and oriented to person, place, and time. She has normal strength. No cranial nerve deficit or sensory deficit. GCS eye subscore is 4. GCS verbal subscore is 5. GCS motor subscore is 6.  Skin: Skin is warm and dry. No rash noted. She is not diaphoretic. No erythema.  Nursing note and vitals reviewed.    ED Treatments / Results  Labs (all labs ordered are listed, but only abnormal results are displayed) Labs Reviewed  CBC WITH DIFFERENTIAL/PLATELET - Abnormal; Notable for the following:       Result Value   RDW 17.0 (*)    All other components within normal limits  COMPREHENSIVE METABOLIC PANEL - Abnormal; Notable for the following:    Chloride 98 (*)    Glucose, Bld 110 (*)    BUN 30 (*)    Creatinine, Ser 4.32 (*)    Alkaline Phosphatase 180 (*)    GFR calc non Af Amer 9 (*)    GFR calc Af Amer 11 (*)    All other components within normal limits  I-STAT CHEM 8, ED - Abnormal; Notable for the following:    Chloride 98 (*)    BUN 30 (*)    Creatinine,  Ser 4.20 (*)    Glucose, Bld 107 (*)    All other components within normal limits  I-STAT VENOUS BLOOD GAS, ED - Abnormal; Notable for the following:    Bicarbonate 30.4 (*)    Acid-Base Excess 5.0 (*)    All other components within normal limits  LIPASE, BLOOD  I-STAT TROPONIN, ED    EKG  EKG Interpretation  Date/Time:  Friday August 30 2017 13:33:03 EDT Ventricular Rate:  84 PR Interval:    QRS Duration: 98 QT Interval:  411 QTC Calculation: 486 R Axis:   -26 Text Interpretation:  Sinus rhythm Prolonged PR interval Anterior infarct, old No significant change since last tracing Confirmed by Gareth Morgan [phone]) on 08/30/2017 1:38:40 PM       Radiology Ct Head Wo Contrast  Result Date: 08/30/2017 CLINICAL DATA:  Altered level of consciousness EXAM: CT HEAD WITHOUT CONTRAST TECHNIQUE: Contiguous axial images were obtained from the base of the skull through the vertex without intravenous contrast. COMPARISON:  None. FINDINGS: Brain: No evidence of acute infarction, hemorrhage, extra-axial collection, ventriculomegaly, or mass effect. Generalized cerebral atrophy. Periventricular white matter low attenuation likely secondary to microangiopathy. Vascular: Cerebrovascular atherosclerotic calcifications are noted. Skull: Negative for fracture or focal lesion. Sinuses/Orbits: Visualized portions of the orbits are unremarkable. Visualized portions of the paranasal sinuses and mastoid air cells are unremarkable. Other: None. IMPRESSION: 1. No acute intracranial pathology. 2. Chronic microvascular disease and cerebral atrophy. Electronically Signed   By: Kathreen Devoid   On: 08/30/2017 17:19   Ct Abdomen Pelvis W Contrast  Result Date: 08/30/2017 CLINICAL DATA:  Nausea and vomiting EXAM: CT ABDOMEN AND PELVIS WITH CONTRAST TECHNIQUE: Multidetector CT imaging of the abdomen and pelvis was performed using the standard protocol following bolus administration of intravenous contrast.  CONTRAST:  141mL ISOVUE-300 IOPAMIDOL (ISOVUE-300) INJECTION 61% COMPARISON:  CT abdomen pelvis 03/26/2017 FINDINGS: Lower chest: No pulmonary nodules or pleural effusion. No visible pericardial effusion. Hepatobiliary: Normal hepatic contours and density. No visible biliary dilatation. Normal gallbladder. Pancreas: Normal contours without ductal dilatation. No peripancreatic  fluid collection. Spleen: Normal. Adrenals/Urinary Tract: --Adrenal glands: Normal. --Right kidney/ureter: No hydronephrosis or perinephric stranding. No nephrolithiasis. No obstructing ureteral stones. --Left kidney/ureter: No hydronephrosis or perinephric stranding. No nephrolithiasis. No obstructing ureteral stones. --Urinary bladder: Unremarkable. Stomach/Bowel: --Stomach/Duodenum: No hiatal hernia or other gastric abnormality. Normal duodenal course and caliber. --Small bowel: No dilatation or inflammation. --Colon: No focal abnormality. --Appendix: Normal. Vascular/Lymphatic: There is a right femoral approach central venous catheter with tip at the superior extent of the field of view, at the superior cavoatrial junction. There is extensive atherosclerotic calcification of the aorta and its branch vessels. No abdominal or pelvic lymphadenopathy. Reproductive: Multiple calcified uterine fibroids.  No adnexal mass. Musculoskeletal. No bony spinal canal stenosis or focal osseous abnormality. Other: None. IMPRESSION: 1. No acute abnormality of the abdomen or pelvis. 2. Extensive abdominal artery atherosclerotic calcification and aortic atherosclerosis (ICD10-I70.0). Electronically Signed   By: Ulyses Jarred M.D.   On: 08/30/2017 17:39    Procedures Procedures (including critical care time)  Medications Ordered in ED Medications  prochlorperazine (COMPAZINE) injection 10 mg (10 mg Intravenous Given 08/30/17 1612)  diphenhydrAMINE (BENADRYL) injection 25 mg (25 mg Intravenous Given 08/30/17 1611)  sodium chloride 0.9 % bolus 500 mL  (500 mLs Intravenous New Bag/Given 08/30/17 1613)  iopamidol (ISOVUE-300) 61 % injection (100 mLs Intravenous Contrast Given 08/30/17 1707)     Initial Impression / Assessment and Plan / ED Course  I have reviewed the triage vital signs and the nursing notes.  Pertinent labs & imaging results that were available during my care of the patient were reviewed by me and considered in my medical decision making (see chart for details).     71 year old female with a history of ESRD on dialysis Tuesday, Thursday and Saturday, CHF, hypertension, hyperlipidemia, peripheral vascular disease, seizures, history of being a Jehovah's Witness, TIA, paroxysmal atrial fibrillation, DM, presents with concern for nausea and vomiting.  On ROS also reports headache which is severe and began slowly. Headache began slowly, no trauma, no fevers, and normal neurologic exam and have low suspicion for [facility], SDH or meningitis. Head CT done and normal.  CT abdomen done given emesis with no flatus and shows no sign of obstruction or other intra-abdominal abnormality. Labs show no sign of DKA or significant electrolyte abnormalities. Patient's creatinine is at baseline due to her dialysis status.  She does not make urine regularly, has no urinary symptoms, normal WBC, have low suspicion for urinary tract infection. Troponin negative.  Patient with primary concern for emesis, and given constant symptoms since last night, no increase in troponin, multiple other symptoms, low suspicion for primary cardiac etiology. Lipase and transaminases WNL.  Symptoms possible viral infection, gastroparesis or migraine.    Patient was given compazine and benadryl with improvement in headache and vomiting.  Her vital signs have remained stable. She is tolerating po gingerale without emesis. Given rx for zofran. Recommend dialysis as scheduled tomorrow and PCP follow up. Patient discharged in stable condition with understanding of reasons to return.    Final Clinical Impressions(s) / ED Diagnoses   Final diagnoses:  Nausea and vomiting, intractability of vomiting not specified, unspecified vomiting type  Acute nonintractable headache, unspecified headache type    New Prescriptions New Prescriptions   No medications on file     Gareth Morgan, MD 08/30/17 1820

## 2017-08-31 DIAGNOSIS — N2581 Secondary hyperparathyroidism of renal origin: Secondary | ICD-10-CM | POA: Diagnosis not present

## 2017-08-31 DIAGNOSIS — D500 Iron deficiency anemia secondary to blood loss (chronic): Secondary | ICD-10-CM | POA: Diagnosis not present

## 2017-08-31 DIAGNOSIS — D631 Anemia in chronic kidney disease: Secondary | ICD-10-CM | POA: Diagnosis not present

## 2017-08-31 DIAGNOSIS — N186 End stage renal disease: Secondary | ICD-10-CM | POA: Diagnosis not present

## 2017-08-31 DIAGNOSIS — E119 Type 2 diabetes mellitus without complications: Secondary | ICD-10-CM | POA: Diagnosis not present

## 2017-09-03 DIAGNOSIS — N186 End stage renal disease: Secondary | ICD-10-CM | POA: Diagnosis not present

## 2017-09-03 DIAGNOSIS — E119 Type 2 diabetes mellitus without complications: Secondary | ICD-10-CM | POA: Diagnosis not present

## 2017-09-03 DIAGNOSIS — D500 Iron deficiency anemia secondary to blood loss (chronic): Secondary | ICD-10-CM | POA: Diagnosis not present

## 2017-09-03 DIAGNOSIS — N2581 Secondary hyperparathyroidism of renal origin: Secondary | ICD-10-CM | POA: Diagnosis not present

## 2017-09-03 DIAGNOSIS — D631 Anemia in chronic kidney disease: Secondary | ICD-10-CM | POA: Diagnosis not present

## 2017-09-04 DIAGNOSIS — R278 Other lack of coordination: Secondary | ICD-10-CM | POA: Diagnosis not present

## 2017-09-04 DIAGNOSIS — S78111S Complete traumatic amputation at level between right hip and knee, sequela: Secondary | ICD-10-CM | POA: Diagnosis not present

## 2017-09-04 DIAGNOSIS — Z7409 Other reduced mobility: Secondary | ICD-10-CM | POA: Diagnosis not present

## 2017-09-05 DIAGNOSIS — N186 End stage renal disease: Secondary | ICD-10-CM | POA: Diagnosis not present

## 2017-09-05 DIAGNOSIS — R278 Other lack of coordination: Secondary | ICD-10-CM | POA: Diagnosis not present

## 2017-09-05 DIAGNOSIS — N2581 Secondary hyperparathyroidism of renal origin: Secondary | ICD-10-CM | POA: Diagnosis not present

## 2017-09-05 DIAGNOSIS — S78111S Complete traumatic amputation at level between right hip and knee, sequela: Secondary | ICD-10-CM | POA: Diagnosis not present

## 2017-09-05 DIAGNOSIS — E119 Type 2 diabetes mellitus without complications: Secondary | ICD-10-CM | POA: Diagnosis not present

## 2017-09-05 DIAGNOSIS — D500 Iron deficiency anemia secondary to blood loss (chronic): Secondary | ICD-10-CM | POA: Diagnosis not present

## 2017-09-05 DIAGNOSIS — D631 Anemia in chronic kidney disease: Secondary | ICD-10-CM | POA: Diagnosis not present

## 2017-09-05 DIAGNOSIS — Z7409 Other reduced mobility: Secondary | ICD-10-CM | POA: Diagnosis not present

## 2017-09-06 DIAGNOSIS — S78111S Complete traumatic amputation at level between right hip and knee, sequela: Secondary | ICD-10-CM | POA: Diagnosis not present

## 2017-09-06 DIAGNOSIS — R278 Other lack of coordination: Secondary | ICD-10-CM | POA: Diagnosis not present

## 2017-09-06 DIAGNOSIS — Z7409 Other reduced mobility: Secondary | ICD-10-CM | POA: Diagnosis not present

## 2017-09-07 DIAGNOSIS — Z7409 Other reduced mobility: Secondary | ICD-10-CM | POA: Diagnosis not present

## 2017-09-07 DIAGNOSIS — S78111S Complete traumatic amputation at level between right hip and knee, sequela: Secondary | ICD-10-CM | POA: Diagnosis not present

## 2017-09-07 DIAGNOSIS — R278 Other lack of coordination: Secondary | ICD-10-CM | POA: Diagnosis not present

## 2017-09-09 DIAGNOSIS — S78111S Complete traumatic amputation at level between right hip and knee, sequela: Secondary | ICD-10-CM | POA: Diagnosis not present

## 2017-09-09 DIAGNOSIS — Z7409 Other reduced mobility: Secondary | ICD-10-CM | POA: Diagnosis not present

## 2017-09-09 DIAGNOSIS — R278 Other lack of coordination: Secondary | ICD-10-CM | POA: Diagnosis not present

## 2017-09-10 DIAGNOSIS — D500 Iron deficiency anemia secondary to blood loss (chronic): Secondary | ICD-10-CM | POA: Diagnosis not present

## 2017-09-10 DIAGNOSIS — N2581 Secondary hyperparathyroidism of renal origin: Secondary | ICD-10-CM | POA: Diagnosis not present

## 2017-09-10 DIAGNOSIS — D631 Anemia in chronic kidney disease: Secondary | ICD-10-CM | POA: Diagnosis not present

## 2017-09-10 DIAGNOSIS — N186 End stage renal disease: Secondary | ICD-10-CM | POA: Diagnosis not present

## 2017-09-10 DIAGNOSIS — E119 Type 2 diabetes mellitus without complications: Secondary | ICD-10-CM | POA: Diagnosis not present

## 2017-09-11 DIAGNOSIS — S78111S Complete traumatic amputation at level between right hip and knee, sequela: Secondary | ICD-10-CM | POA: Diagnosis not present

## 2017-09-11 DIAGNOSIS — Z992 Dependence on renal dialysis: Secondary | ICD-10-CM | POA: Diagnosis not present

## 2017-09-11 DIAGNOSIS — R278 Other lack of coordination: Secondary | ICD-10-CM | POA: Diagnosis not present

## 2017-09-11 DIAGNOSIS — E1122 Type 2 diabetes mellitus with diabetic chronic kidney disease: Secondary | ICD-10-CM | POA: Diagnosis not present

## 2017-09-11 DIAGNOSIS — Z7409 Other reduced mobility: Secondary | ICD-10-CM | POA: Diagnosis not present

## 2017-09-11 DIAGNOSIS — N186 End stage renal disease: Secondary | ICD-10-CM | POA: Diagnosis not present

## 2017-09-12 DIAGNOSIS — N2581 Secondary hyperparathyroidism of renal origin: Secondary | ICD-10-CM | POA: Diagnosis not present

## 2017-09-12 DIAGNOSIS — Z992 Dependence on renal dialysis: Secondary | ICD-10-CM | POA: Diagnosis not present

## 2017-09-12 DIAGNOSIS — E119 Type 2 diabetes mellitus without complications: Secondary | ICD-10-CM | POA: Diagnosis not present

## 2017-09-12 DIAGNOSIS — D631 Anemia in chronic kidney disease: Secondary | ICD-10-CM | POA: Diagnosis not present

## 2017-09-12 DIAGNOSIS — N186 End stage renal disease: Secondary | ICD-10-CM | POA: Diagnosis not present

## 2017-09-13 DIAGNOSIS — S78111S Complete traumatic amputation at level between right hip and knee, sequela: Secondary | ICD-10-CM | POA: Diagnosis not present

## 2017-09-13 DIAGNOSIS — Z7409 Other reduced mobility: Secondary | ICD-10-CM | POA: Diagnosis not present

## 2017-09-13 DIAGNOSIS — R278 Other lack of coordination: Secondary | ICD-10-CM | POA: Diagnosis not present

## 2017-09-16 DIAGNOSIS — S78111S Complete traumatic amputation at level between right hip and knee, sequela: Secondary | ICD-10-CM | POA: Diagnosis not present

## 2017-09-16 DIAGNOSIS — Z7409 Other reduced mobility: Secondary | ICD-10-CM | POA: Diagnosis not present

## 2017-09-16 DIAGNOSIS — R278 Other lack of coordination: Secondary | ICD-10-CM | POA: Diagnosis not present

## 2017-09-16 NOTE — Telephone Encounter (Signed)
Entered in error

## 2017-09-17 DIAGNOSIS — R278 Other lack of coordination: Secondary | ICD-10-CM | POA: Diagnosis not present

## 2017-09-17 DIAGNOSIS — Z7409 Other reduced mobility: Secondary | ICD-10-CM | POA: Diagnosis not present

## 2017-09-17 DIAGNOSIS — S78111S Complete traumatic amputation at level between right hip and knee, sequela: Secondary | ICD-10-CM | POA: Diagnosis not present

## 2017-09-18 DIAGNOSIS — S78111S Complete traumatic amputation at level between right hip and knee, sequela: Secondary | ICD-10-CM | POA: Diagnosis not present

## 2017-09-18 DIAGNOSIS — Z7409 Other reduced mobility: Secondary | ICD-10-CM | POA: Diagnosis not present

## 2017-09-18 DIAGNOSIS — R278 Other lack of coordination: Secondary | ICD-10-CM | POA: Diagnosis not present

## 2017-09-19 DIAGNOSIS — D631 Anemia in chronic kidney disease: Secondary | ICD-10-CM | POA: Diagnosis not present

## 2017-09-19 DIAGNOSIS — N2581 Secondary hyperparathyroidism of renal origin: Secondary | ICD-10-CM | POA: Diagnosis not present

## 2017-09-19 DIAGNOSIS — Z992 Dependence on renal dialysis: Secondary | ICD-10-CM | POA: Diagnosis not present

## 2017-09-19 DIAGNOSIS — N186 End stage renal disease: Secondary | ICD-10-CM | POA: Diagnosis not present

## 2017-09-19 DIAGNOSIS — E119 Type 2 diabetes mellitus without complications: Secondary | ICD-10-CM | POA: Diagnosis not present

## 2017-09-20 DIAGNOSIS — Z7409 Other reduced mobility: Secondary | ICD-10-CM | POA: Diagnosis not present

## 2017-09-20 DIAGNOSIS — R278 Other lack of coordination: Secondary | ICD-10-CM | POA: Diagnosis not present

## 2017-09-20 DIAGNOSIS — S78111S Complete traumatic amputation at level between right hip and knee, sequela: Secondary | ICD-10-CM | POA: Diagnosis not present

## 2017-09-21 DIAGNOSIS — E119 Type 2 diabetes mellitus without complications: Secondary | ICD-10-CM | POA: Diagnosis not present

## 2017-09-21 DIAGNOSIS — R278 Other lack of coordination: Secondary | ICD-10-CM | POA: Diagnosis not present

## 2017-09-21 DIAGNOSIS — Z992 Dependence on renal dialysis: Secondary | ICD-10-CM | POA: Diagnosis not present

## 2017-09-21 DIAGNOSIS — S78111S Complete traumatic amputation at level between right hip and knee, sequela: Secondary | ICD-10-CM | POA: Diagnosis not present

## 2017-09-21 DIAGNOSIS — N2581 Secondary hyperparathyroidism of renal origin: Secondary | ICD-10-CM | POA: Diagnosis not present

## 2017-09-21 DIAGNOSIS — Z7409 Other reduced mobility: Secondary | ICD-10-CM | POA: Diagnosis not present

## 2017-09-21 DIAGNOSIS — D631 Anemia in chronic kidney disease: Secondary | ICD-10-CM | POA: Diagnosis not present

## 2017-09-21 DIAGNOSIS — N186 End stage renal disease: Secondary | ICD-10-CM | POA: Diagnosis not present

## 2017-09-23 DIAGNOSIS — Z7409 Other reduced mobility: Secondary | ICD-10-CM | POA: Diagnosis not present

## 2017-09-23 DIAGNOSIS — S78111S Complete traumatic amputation at level between right hip and knee, sequela: Secondary | ICD-10-CM | POA: Diagnosis not present

## 2017-09-23 DIAGNOSIS — R278 Other lack of coordination: Secondary | ICD-10-CM | POA: Diagnosis not present

## 2017-09-24 DIAGNOSIS — D631 Anemia in chronic kidney disease: Secondary | ICD-10-CM | POA: Diagnosis not present

## 2017-09-24 DIAGNOSIS — N186 End stage renal disease: Secondary | ICD-10-CM | POA: Diagnosis not present

## 2017-09-24 DIAGNOSIS — N2581 Secondary hyperparathyroidism of renal origin: Secondary | ICD-10-CM | POA: Diagnosis not present

## 2017-09-24 DIAGNOSIS — Z992 Dependence on renal dialysis: Secondary | ICD-10-CM | POA: Diagnosis not present

## 2017-09-24 DIAGNOSIS — E119 Type 2 diabetes mellitus without complications: Secondary | ICD-10-CM | POA: Diagnosis not present

## 2017-09-25 DIAGNOSIS — S78111S Complete traumatic amputation at level between right hip and knee, sequela: Secondary | ICD-10-CM | POA: Diagnosis not present

## 2017-09-25 DIAGNOSIS — Z7409 Other reduced mobility: Secondary | ICD-10-CM | POA: Diagnosis not present

## 2017-09-25 DIAGNOSIS — R278 Other lack of coordination: Secondary | ICD-10-CM | POA: Diagnosis not present

## 2017-09-26 DIAGNOSIS — F411 Generalized anxiety disorder: Secondary | ICD-10-CM | POA: Diagnosis not present

## 2017-09-26 DIAGNOSIS — F3341 Major depressive disorder, recurrent, in partial remission: Secondary | ICD-10-CM | POA: Diagnosis not present

## 2017-09-26 DIAGNOSIS — F4325 Adjustment disorder with mixed disturbance of emotions and conduct: Secondary | ICD-10-CM | POA: Diagnosis not present

## 2017-09-26 DIAGNOSIS — G4700 Insomnia, unspecified: Secondary | ICD-10-CM | POA: Diagnosis not present

## 2017-09-27 DIAGNOSIS — Z7409 Other reduced mobility: Secondary | ICD-10-CM | POA: Diagnosis not present

## 2017-09-27 DIAGNOSIS — R278 Other lack of coordination: Secondary | ICD-10-CM | POA: Diagnosis not present

## 2017-09-27 DIAGNOSIS — S78111S Complete traumatic amputation at level between right hip and knee, sequela: Secondary | ICD-10-CM | POA: Diagnosis not present

## 2017-09-28 DIAGNOSIS — N186 End stage renal disease: Secondary | ICD-10-CM | POA: Diagnosis not present

## 2017-09-28 DIAGNOSIS — E119 Type 2 diabetes mellitus without complications: Secondary | ICD-10-CM | POA: Diagnosis not present

## 2017-09-28 DIAGNOSIS — N2581 Secondary hyperparathyroidism of renal origin: Secondary | ICD-10-CM | POA: Diagnosis not present

## 2017-09-28 DIAGNOSIS — D631 Anemia in chronic kidney disease: Secondary | ICD-10-CM | POA: Diagnosis not present

## 2017-09-28 DIAGNOSIS — Z992 Dependence on renal dialysis: Secondary | ICD-10-CM | POA: Diagnosis not present

## 2017-09-30 DIAGNOSIS — E119 Type 2 diabetes mellitus without complications: Secondary | ICD-10-CM | POA: Diagnosis not present

## 2017-09-30 DIAGNOSIS — D631 Anemia in chronic kidney disease: Secondary | ICD-10-CM | POA: Diagnosis not present

## 2017-09-30 DIAGNOSIS — N2581 Secondary hyperparathyroidism of renal origin: Secondary | ICD-10-CM | POA: Diagnosis not present

## 2017-09-30 DIAGNOSIS — N186 End stage renal disease: Secondary | ICD-10-CM | POA: Diagnosis not present

## 2017-09-30 DIAGNOSIS — Z992 Dependence on renal dialysis: Secondary | ICD-10-CM | POA: Diagnosis not present

## 2017-10-01 DIAGNOSIS — Z7409 Other reduced mobility: Secondary | ICD-10-CM | POA: Diagnosis not present

## 2017-10-01 DIAGNOSIS — R278 Other lack of coordination: Secondary | ICD-10-CM | POA: Diagnosis not present

## 2017-10-01 DIAGNOSIS — S78111S Complete traumatic amputation at level between right hip and knee, sequela: Secondary | ICD-10-CM | POA: Diagnosis not present

## 2017-10-02 DIAGNOSIS — D631 Anemia in chronic kidney disease: Secondary | ICD-10-CM | POA: Diagnosis not present

## 2017-10-02 DIAGNOSIS — N2581 Secondary hyperparathyroidism of renal origin: Secondary | ICD-10-CM | POA: Diagnosis not present

## 2017-10-02 DIAGNOSIS — E119 Type 2 diabetes mellitus without complications: Secondary | ICD-10-CM | POA: Diagnosis not present

## 2017-10-02 DIAGNOSIS — N186 End stage renal disease: Secondary | ICD-10-CM | POA: Diagnosis not present

## 2017-10-02 DIAGNOSIS — Z992 Dependence on renal dialysis: Secondary | ICD-10-CM | POA: Diagnosis not present

## 2017-10-03 DIAGNOSIS — S78111S Complete traumatic amputation at level between right hip and knee, sequela: Secondary | ICD-10-CM | POA: Diagnosis not present

## 2017-10-03 DIAGNOSIS — R278 Other lack of coordination: Secondary | ICD-10-CM | POA: Diagnosis not present

## 2017-10-03 DIAGNOSIS — Z7409 Other reduced mobility: Secondary | ICD-10-CM | POA: Diagnosis not present

## 2017-10-04 DIAGNOSIS — R278 Other lack of coordination: Secondary | ICD-10-CM | POA: Diagnosis not present

## 2017-10-04 DIAGNOSIS — S78111S Complete traumatic amputation at level between right hip and knee, sequela: Secondary | ICD-10-CM | POA: Diagnosis not present

## 2017-10-04 DIAGNOSIS — Z7409 Other reduced mobility: Secondary | ICD-10-CM | POA: Diagnosis not present

## 2017-10-05 DIAGNOSIS — D631 Anemia in chronic kidney disease: Secondary | ICD-10-CM | POA: Diagnosis not present

## 2017-10-05 DIAGNOSIS — N186 End stage renal disease: Secondary | ICD-10-CM | POA: Diagnosis not present

## 2017-10-05 DIAGNOSIS — E119 Type 2 diabetes mellitus without complications: Secondary | ICD-10-CM | POA: Diagnosis not present

## 2017-10-05 DIAGNOSIS — N2581 Secondary hyperparathyroidism of renal origin: Secondary | ICD-10-CM | POA: Diagnosis not present

## 2017-10-05 DIAGNOSIS — Z992 Dependence on renal dialysis: Secondary | ICD-10-CM | POA: Diagnosis not present

## 2017-10-07 ENCOUNTER — Ambulatory Visit (HOSPITAL_COMMUNITY): Payer: Medicare Other

## 2017-10-07 ENCOUNTER — Ambulatory Visit: Payer: Medicare Other | Admitting: Surgery

## 2017-10-07 DIAGNOSIS — S78111S Complete traumatic amputation at level between right hip and knee, sequela: Secondary | ICD-10-CM | POA: Diagnosis not present

## 2017-10-07 DIAGNOSIS — Z7409 Other reduced mobility: Secondary | ICD-10-CM | POA: Diagnosis not present

## 2017-10-07 DIAGNOSIS — R278 Other lack of coordination: Secondary | ICD-10-CM | POA: Diagnosis not present

## 2017-10-08 DIAGNOSIS — Z992 Dependence on renal dialysis: Secondary | ICD-10-CM | POA: Diagnosis not present

## 2017-10-08 DIAGNOSIS — N186 End stage renal disease: Secondary | ICD-10-CM | POA: Diagnosis not present

## 2017-10-08 DIAGNOSIS — N2581 Secondary hyperparathyroidism of renal origin: Secondary | ICD-10-CM | POA: Diagnosis not present

## 2017-10-08 DIAGNOSIS — D631 Anemia in chronic kidney disease: Secondary | ICD-10-CM | POA: Diagnosis not present

## 2017-10-08 DIAGNOSIS — E119 Type 2 diabetes mellitus without complications: Secondary | ICD-10-CM | POA: Diagnosis not present

## 2017-10-09 DIAGNOSIS — S78111S Complete traumatic amputation at level between right hip and knee, sequela: Secondary | ICD-10-CM | POA: Diagnosis not present

## 2017-10-09 DIAGNOSIS — R278 Other lack of coordination: Secondary | ICD-10-CM | POA: Diagnosis not present

## 2017-10-09 DIAGNOSIS — Z7409 Other reduced mobility: Secondary | ICD-10-CM | POA: Diagnosis not present

## 2017-10-10 DIAGNOSIS — N186 End stage renal disease: Secondary | ICD-10-CM | POA: Diagnosis not present

## 2017-10-10 DIAGNOSIS — D631 Anemia in chronic kidney disease: Secondary | ICD-10-CM | POA: Diagnosis not present

## 2017-10-10 DIAGNOSIS — E119 Type 2 diabetes mellitus without complications: Secondary | ICD-10-CM | POA: Diagnosis not present

## 2017-10-10 DIAGNOSIS — N2581 Secondary hyperparathyroidism of renal origin: Secondary | ICD-10-CM | POA: Diagnosis not present

## 2017-10-10 DIAGNOSIS — Z992 Dependence on renal dialysis: Secondary | ICD-10-CM | POA: Diagnosis not present

## 2017-10-11 DIAGNOSIS — Z992 Dependence on renal dialysis: Secondary | ICD-10-CM | POA: Diagnosis not present

## 2017-10-11 DIAGNOSIS — E1122 Type 2 diabetes mellitus with diabetic chronic kidney disease: Secondary | ICD-10-CM | POA: Diagnosis not present

## 2017-10-11 DIAGNOSIS — R278 Other lack of coordination: Secondary | ICD-10-CM | POA: Diagnosis not present

## 2017-10-11 DIAGNOSIS — S78111S Complete traumatic amputation at level between right hip and knee, sequela: Secondary | ICD-10-CM | POA: Diagnosis not present

## 2017-10-11 DIAGNOSIS — Z7409 Other reduced mobility: Secondary | ICD-10-CM | POA: Diagnosis not present

## 2017-10-11 DIAGNOSIS — N186 End stage renal disease: Secondary | ICD-10-CM | POA: Diagnosis not present

## 2017-10-12 DIAGNOSIS — D631 Anemia in chronic kidney disease: Secondary | ICD-10-CM | POA: Diagnosis not present

## 2017-10-12 DIAGNOSIS — N186 End stage renal disease: Secondary | ICD-10-CM | POA: Diagnosis not present

## 2017-10-12 DIAGNOSIS — T8249XD Other complication of vascular dialysis catheter, subsequent encounter: Secondary | ICD-10-CM | POA: Diagnosis not present

## 2017-10-12 DIAGNOSIS — N2581 Secondary hyperparathyroidism of renal origin: Secondary | ICD-10-CM | POA: Diagnosis not present

## 2017-10-14 DIAGNOSIS — R278 Other lack of coordination: Secondary | ICD-10-CM | POA: Diagnosis not present

## 2017-10-14 DIAGNOSIS — Z7409 Other reduced mobility: Secondary | ICD-10-CM | POA: Diagnosis not present

## 2017-10-14 DIAGNOSIS — S78111S Complete traumatic amputation at level between right hip and knee, sequela: Secondary | ICD-10-CM | POA: Diagnosis not present

## 2017-10-15 DIAGNOSIS — D631 Anemia in chronic kidney disease: Secondary | ICD-10-CM | POA: Diagnosis not present

## 2017-10-15 DIAGNOSIS — N186 End stage renal disease: Secondary | ICD-10-CM | POA: Diagnosis not present

## 2017-10-15 DIAGNOSIS — N2581 Secondary hyperparathyroidism of renal origin: Secondary | ICD-10-CM | POA: Diagnosis not present

## 2017-10-15 DIAGNOSIS — T8249XD Other complication of vascular dialysis catheter, subsequent encounter: Secondary | ICD-10-CM | POA: Diagnosis not present

## 2017-10-16 DIAGNOSIS — E7800 Pure hypercholesterolemia, unspecified: Secondary | ICD-10-CM | POA: Diagnosis not present

## 2017-10-16 DIAGNOSIS — E119 Type 2 diabetes mellitus without complications: Secondary | ICD-10-CM | POA: Diagnosis not present

## 2017-10-16 DIAGNOSIS — E0822 Diabetes mellitus due to underlying condition with diabetic chronic kidney disease: Secondary | ICD-10-CM | POA: Diagnosis not present

## 2017-10-16 DIAGNOSIS — S78111S Complete traumatic amputation at level between right hip and knee, sequela: Secondary | ICD-10-CM | POA: Diagnosis not present

## 2017-10-16 DIAGNOSIS — R278 Other lack of coordination: Secondary | ICD-10-CM | POA: Diagnosis not present

## 2017-10-16 DIAGNOSIS — N186 End stage renal disease: Secondary | ICD-10-CM | POA: Diagnosis not present

## 2017-10-16 DIAGNOSIS — I739 Peripheral vascular disease, unspecified: Secondary | ICD-10-CM | POA: Diagnosis not present

## 2017-10-16 DIAGNOSIS — Z7409 Other reduced mobility: Secondary | ICD-10-CM | POA: Diagnosis not present

## 2017-10-17 DIAGNOSIS — F39 Unspecified mood [affective] disorder: Secondary | ICD-10-CM | POA: Diagnosis not present

## 2017-10-17 DIAGNOSIS — F3341 Major depressive disorder, recurrent, in partial remission: Secondary | ICD-10-CM | POA: Diagnosis not present

## 2017-10-17 DIAGNOSIS — F411 Generalized anxiety disorder: Secondary | ICD-10-CM | POA: Diagnosis not present

## 2017-10-17 DIAGNOSIS — F4325 Adjustment disorder with mixed disturbance of emotions and conduct: Secondary | ICD-10-CM | POA: Diagnosis not present

## 2017-10-18 DIAGNOSIS — R278 Other lack of coordination: Secondary | ICD-10-CM | POA: Diagnosis not present

## 2017-10-18 DIAGNOSIS — Z7409 Other reduced mobility: Secondary | ICD-10-CM | POA: Diagnosis not present

## 2017-10-18 DIAGNOSIS — S78111S Complete traumatic amputation at level between right hip and knee, sequela: Secondary | ICD-10-CM | POA: Diagnosis not present

## 2017-10-19 DIAGNOSIS — T8249XD Other complication of vascular dialysis catheter, subsequent encounter: Secondary | ICD-10-CM | POA: Diagnosis not present

## 2017-10-19 DIAGNOSIS — D631 Anemia in chronic kidney disease: Secondary | ICD-10-CM | POA: Diagnosis not present

## 2017-10-19 DIAGNOSIS — N186 End stage renal disease: Secondary | ICD-10-CM | POA: Diagnosis not present

## 2017-10-19 DIAGNOSIS — N2581 Secondary hyperparathyroidism of renal origin: Secondary | ICD-10-CM | POA: Diagnosis not present

## 2017-10-22 DIAGNOSIS — D631 Anemia in chronic kidney disease: Secondary | ICD-10-CM | POA: Diagnosis not present

## 2017-10-22 DIAGNOSIS — N2581 Secondary hyperparathyroidism of renal origin: Secondary | ICD-10-CM | POA: Diagnosis not present

## 2017-10-22 DIAGNOSIS — N186 End stage renal disease: Secondary | ICD-10-CM | POA: Diagnosis not present

## 2017-10-22 DIAGNOSIS — T8249XD Other complication of vascular dialysis catheter, subsequent encounter: Secondary | ICD-10-CM | POA: Diagnosis not present

## 2017-10-23 DIAGNOSIS — S78111S Complete traumatic amputation at level between right hip and knee, sequela: Secondary | ICD-10-CM | POA: Diagnosis not present

## 2017-10-23 DIAGNOSIS — R278 Other lack of coordination: Secondary | ICD-10-CM | POA: Diagnosis not present

## 2017-10-23 DIAGNOSIS — Z7409 Other reduced mobility: Secondary | ICD-10-CM | POA: Diagnosis not present

## 2017-10-23 DIAGNOSIS — E119 Type 2 diabetes mellitus without complications: Secondary | ICD-10-CM | POA: Diagnosis not present

## 2017-10-24 DIAGNOSIS — D631 Anemia in chronic kidney disease: Secondary | ICD-10-CM | POA: Diagnosis not present

## 2017-10-24 DIAGNOSIS — T8249XD Other complication of vascular dialysis catheter, subsequent encounter: Secondary | ICD-10-CM | POA: Diagnosis not present

## 2017-10-24 DIAGNOSIS — N2581 Secondary hyperparathyroidism of renal origin: Secondary | ICD-10-CM | POA: Diagnosis not present

## 2017-10-24 DIAGNOSIS — N186 End stage renal disease: Secondary | ICD-10-CM | POA: Diagnosis not present

## 2017-10-25 DIAGNOSIS — Z7409 Other reduced mobility: Secondary | ICD-10-CM | POA: Diagnosis not present

## 2017-10-25 DIAGNOSIS — R278 Other lack of coordination: Secondary | ICD-10-CM | POA: Diagnosis not present

## 2017-10-25 DIAGNOSIS — S78111S Complete traumatic amputation at level between right hip and knee, sequela: Secondary | ICD-10-CM | POA: Diagnosis not present

## 2017-10-26 DIAGNOSIS — R278 Other lack of coordination: Secondary | ICD-10-CM | POA: Diagnosis not present

## 2017-10-26 DIAGNOSIS — S78111S Complete traumatic amputation at level between right hip and knee, sequela: Secondary | ICD-10-CM | POA: Diagnosis not present

## 2017-10-26 DIAGNOSIS — Z7409 Other reduced mobility: Secondary | ICD-10-CM | POA: Diagnosis not present

## 2017-10-28 DIAGNOSIS — Z7409 Other reduced mobility: Secondary | ICD-10-CM | POA: Diagnosis not present

## 2017-10-28 DIAGNOSIS — R278 Other lack of coordination: Secondary | ICD-10-CM | POA: Diagnosis not present

## 2017-10-28 DIAGNOSIS — S78111S Complete traumatic amputation at level between right hip and knee, sequela: Secondary | ICD-10-CM | POA: Diagnosis not present

## 2017-10-29 DIAGNOSIS — T8249XD Other complication of vascular dialysis catheter, subsequent encounter: Secondary | ICD-10-CM | POA: Diagnosis not present

## 2017-10-29 DIAGNOSIS — N186 End stage renal disease: Secondary | ICD-10-CM | POA: Diagnosis not present

## 2017-10-29 DIAGNOSIS — N2581 Secondary hyperparathyroidism of renal origin: Secondary | ICD-10-CM | POA: Diagnosis not present

## 2017-10-29 DIAGNOSIS — D631 Anemia in chronic kidney disease: Secondary | ICD-10-CM | POA: Diagnosis not present

## 2017-10-30 DIAGNOSIS — S78111S Complete traumatic amputation at level between right hip and knee, sequela: Secondary | ICD-10-CM | POA: Diagnosis not present

## 2017-10-30 DIAGNOSIS — Z7409 Other reduced mobility: Secondary | ICD-10-CM | POA: Diagnosis not present

## 2017-10-30 DIAGNOSIS — R278 Other lack of coordination: Secondary | ICD-10-CM | POA: Diagnosis not present

## 2017-10-31 DIAGNOSIS — F3341 Major depressive disorder, recurrent, in partial remission: Secondary | ICD-10-CM | POA: Diagnosis not present

## 2017-10-31 DIAGNOSIS — T8249XD Other complication of vascular dialysis catheter, subsequent encounter: Secondary | ICD-10-CM | POA: Diagnosis not present

## 2017-10-31 DIAGNOSIS — D631 Anemia in chronic kidney disease: Secondary | ICD-10-CM | POA: Diagnosis not present

## 2017-10-31 DIAGNOSIS — N2581 Secondary hyperparathyroidism of renal origin: Secondary | ICD-10-CM | POA: Diagnosis not present

## 2017-10-31 DIAGNOSIS — F411 Generalized anxiety disorder: Secondary | ICD-10-CM | POA: Diagnosis not present

## 2017-10-31 DIAGNOSIS — F54 Psychological and behavioral factors associated with disorders or diseases classified elsewhere: Secondary | ICD-10-CM | POA: Diagnosis not present

## 2017-10-31 DIAGNOSIS — N186 End stage renal disease: Secondary | ICD-10-CM | POA: Diagnosis not present

## 2017-10-31 DIAGNOSIS — F0391 Unspecified dementia with behavioral disturbance: Secondary | ICD-10-CM | POA: Diagnosis not present

## 2017-11-01 DIAGNOSIS — S78111S Complete traumatic amputation at level between right hip and knee, sequela: Secondary | ICD-10-CM | POA: Diagnosis not present

## 2017-11-01 DIAGNOSIS — R278 Other lack of coordination: Secondary | ICD-10-CM | POA: Diagnosis not present

## 2017-11-01 DIAGNOSIS — Z7409 Other reduced mobility: Secondary | ICD-10-CM | POA: Diagnosis not present

## 2017-11-04 DIAGNOSIS — R278 Other lack of coordination: Secondary | ICD-10-CM | POA: Diagnosis not present

## 2017-11-04 DIAGNOSIS — Z7409 Other reduced mobility: Secondary | ICD-10-CM | POA: Diagnosis not present

## 2017-11-04 DIAGNOSIS — S78111S Complete traumatic amputation at level between right hip and knee, sequela: Secondary | ICD-10-CM | POA: Diagnosis not present

## 2017-11-06 DIAGNOSIS — N186 End stage renal disease: Secondary | ICD-10-CM | POA: Diagnosis not present

## 2017-11-06 DIAGNOSIS — Z992 Dependence on renal dialysis: Secondary | ICD-10-CM | POA: Diagnosis not present

## 2017-11-06 DIAGNOSIS — T8249XA Other complication of vascular dialysis catheter, initial encounter: Secondary | ICD-10-CM | POA: Diagnosis not present

## 2017-11-06 DIAGNOSIS — S78111S Complete traumatic amputation at level between right hip and knee, sequela: Secondary | ICD-10-CM | POA: Diagnosis not present

## 2017-11-06 DIAGNOSIS — Z7409 Other reduced mobility: Secondary | ICD-10-CM | POA: Diagnosis not present

## 2017-11-06 DIAGNOSIS — R278 Other lack of coordination: Secondary | ICD-10-CM | POA: Diagnosis not present

## 2017-11-09 ENCOUNTER — Inpatient Hospital Stay (HOSPITAL_COMMUNITY)
Admission: EM | Admit: 2017-11-09 | Discharge: 2017-11-15 | DRG: 640 | Disposition: A | Discharge status: Skilled Nursing Facility | Payer: Medicare Other | Attending: Family Medicine | Admitting: Family Medicine

## 2017-11-09 ENCOUNTER — Encounter (HOSPITAL_COMMUNITY): Payer: Self-pay

## 2017-11-09 ENCOUNTER — Emergency Department (HOSPITAL_COMMUNITY): Payer: Medicare Other

## 2017-11-09 DIAGNOSIS — E1149 Type 2 diabetes mellitus with other diabetic neurological complication: Secondary | ICD-10-CM | POA: Diagnosis present

## 2017-11-09 DIAGNOSIS — R42 Dizziness and giddiness: Secondary | ICD-10-CM | POA: Diagnosis not present

## 2017-11-09 DIAGNOSIS — E875 Hyperkalemia: Secondary | ICD-10-CM | POA: Diagnosis present

## 2017-11-09 DIAGNOSIS — Z89421 Acquired absence of other right toe(s): Secondary | ICD-10-CM | POA: Diagnosis not present

## 2017-11-09 DIAGNOSIS — I132 Hypertensive heart and chronic kidney disease with heart failure and with stage 5 chronic kidney disease, or end stage renal disease: Secondary | ICD-10-CM | POA: Diagnosis present

## 2017-11-09 DIAGNOSIS — R0602 Shortness of breath: Secondary | ICD-10-CM

## 2017-11-09 DIAGNOSIS — K219 Gastro-esophageal reflux disease without esophagitis: Secondary | ICD-10-CM | POA: Diagnosis present

## 2017-11-09 DIAGNOSIS — Z9862 Peripheral vascular angioplasty status: Secondary | ICD-10-CM

## 2017-11-09 DIAGNOSIS — I10 Essential (primary) hypertension: Secondary | ICD-10-CM | POA: Diagnosis not present

## 2017-11-09 DIAGNOSIS — E1152 Type 2 diabetes mellitus with diabetic peripheral angiopathy with gangrene: Secondary | ICD-10-CM | POA: Diagnosis present

## 2017-11-09 DIAGNOSIS — R569 Unspecified convulsions: Secondary | ICD-10-CM | POA: Diagnosis not present

## 2017-11-09 DIAGNOSIS — E1142 Type 2 diabetes mellitus with diabetic polyneuropathy: Secondary | ICD-10-CM | POA: Diagnosis present

## 2017-11-09 DIAGNOSIS — Z7982 Long term (current) use of aspirin: Secondary | ICD-10-CM

## 2017-11-09 DIAGNOSIS — E1122 Type 2 diabetes mellitus with diabetic chronic kidney disease: Secondary | ICD-10-CM | POA: Diagnosis present

## 2017-11-09 DIAGNOSIS — R531 Weakness: Secondary | ICD-10-CM | POA: Diagnosis not present

## 2017-11-09 DIAGNOSIS — I959 Hypotension, unspecified: Secondary | ICD-10-CM | POA: Diagnosis not present

## 2017-11-09 DIAGNOSIS — N2581 Secondary hyperparathyroidism of renal origin: Secondary | ICD-10-CM | POA: Diagnosis present

## 2017-11-09 DIAGNOSIS — Z881 Allergy status to other antibiotic agents status: Secondary | ICD-10-CM

## 2017-11-09 DIAGNOSIS — F329 Major depressive disorder, single episode, unspecified: Secondary | ICD-10-CM | POA: Diagnosis present

## 2017-11-09 DIAGNOSIS — N186 End stage renal disease: Secondary | ICD-10-CM | POA: Diagnosis not present

## 2017-11-09 DIAGNOSIS — G459 Transient cerebral ischemic attack, unspecified: Secondary | ICD-10-CM | POA: Diagnosis not present

## 2017-11-09 DIAGNOSIS — S78111S Complete traumatic amputation at level between right hip and knee, sequela: Secondary | ICD-10-CM | POA: Diagnosis not present

## 2017-11-09 DIAGNOSIS — Z882 Allergy status to sulfonamides status: Secondary | ICD-10-CM

## 2017-11-09 DIAGNOSIS — Z9119 Patient's noncompliance with other medical treatment and regimen: Secondary | ICD-10-CM

## 2017-11-09 DIAGNOSIS — Z86711 Personal history of pulmonary embolism: Secondary | ICD-10-CM

## 2017-11-09 DIAGNOSIS — Z880 Allergy status to penicillin: Secondary | ICD-10-CM

## 2017-11-09 DIAGNOSIS — Z66 Do not resuscitate: Secondary | ICD-10-CM | POA: Diagnosis not present

## 2017-11-09 DIAGNOSIS — I480 Paroxysmal atrial fibrillation: Secondary | ICD-10-CM | POA: Diagnosis present

## 2017-11-09 DIAGNOSIS — Z89411 Acquired absence of right great toe: Secondary | ICD-10-CM

## 2017-11-09 DIAGNOSIS — D631 Anemia in chronic kidney disease: Secondary | ICD-10-CM | POA: Diagnosis present

## 2017-11-09 DIAGNOSIS — I160 Hypertensive urgency: Secondary | ICD-10-CM | POA: Diagnosis not present

## 2017-11-09 DIAGNOSIS — J90 Pleural effusion, not elsewhere classified: Secondary | ICD-10-CM | POA: Diagnosis not present

## 2017-11-09 DIAGNOSIS — Z89612 Acquired absence of left leg above knee: Secondary | ICD-10-CM

## 2017-11-09 DIAGNOSIS — E785 Hyperlipidemia, unspecified: Secondary | ICD-10-CM | POA: Diagnosis present

## 2017-11-09 DIAGNOSIS — R41841 Cognitive communication deficit: Secondary | ICD-10-CM | POA: Diagnosis not present

## 2017-11-09 DIAGNOSIS — E11319 Type 2 diabetes mellitus with unspecified diabetic retinopathy without macular edema: Secondary | ICD-10-CM | POA: Diagnosis present

## 2017-11-09 DIAGNOSIS — I5032 Chronic diastolic (congestive) heart failure: Secondary | ICD-10-CM | POA: Diagnosis present

## 2017-11-09 DIAGNOSIS — Z87891 Personal history of nicotine dependence: Secondary | ICD-10-CM

## 2017-11-09 DIAGNOSIS — Z7409 Other reduced mobility: Secondary | ICD-10-CM | POA: Diagnosis not present

## 2017-11-09 DIAGNOSIS — N179 Acute kidney failure, unspecified: Secondary | ICD-10-CM | POA: Diagnosis not present

## 2017-11-09 DIAGNOSIS — Z992 Dependence on renal dialysis: Secondary | ICD-10-CM | POA: Diagnosis not present

## 2017-11-09 DIAGNOSIS — R404 Transient alteration of awareness: Secondary | ICD-10-CM | POA: Diagnosis not present

## 2017-11-09 DIAGNOSIS — I120 Hypertensive chronic kidney disease with stage 5 chronic kidney disease or end stage renal disease: Secondary | ICD-10-CM | POA: Diagnosis not present

## 2017-11-09 DIAGNOSIS — Z9114 Patient's other noncompliance with medication regimen: Secondary | ICD-10-CM

## 2017-11-09 DIAGNOSIS — G253 Myoclonus: Secondary | ICD-10-CM | POA: Diagnosis not present

## 2017-11-09 DIAGNOSIS — Z8673 Personal history of transient ischemic attack (TIA), and cerebral infarction without residual deficits: Secondary | ICD-10-CM

## 2017-11-09 DIAGNOSIS — Z9115 Patient's noncompliance with renal dialysis: Secondary | ICD-10-CM | POA: Diagnosis not present

## 2017-11-09 DIAGNOSIS — Z794 Long term (current) use of insulin: Secondary | ICD-10-CM

## 2017-11-09 DIAGNOSIS — R278 Other lack of coordination: Secondary | ICD-10-CM | POA: Diagnosis not present

## 2017-11-09 DIAGNOSIS — Z91040 Latex allergy status: Secondary | ICD-10-CM

## 2017-11-09 LAB — CBC
HEMATOCRIT: 41.2 % (ref 36.0–46.0)
Hemoglobin: 13.4 g/dL (ref 12.0–15.0)
MCH: 28.6 pg (ref 26.0–34.0)
MCHC: 32.5 g/dL (ref 30.0–36.0)
MCV: 88 fL (ref 78.0–100.0)
Platelets: 210 10*3/uL (ref 150–400)
RBC: 4.68 MIL/uL (ref 3.87–5.11)
RDW: 17.9 % — ABNORMAL HIGH (ref 11.5–15.5)
WBC: 5.8 10*3/uL (ref 4.0–10.5)

## 2017-11-09 LAB — I-STAT CHEM 8, ED
BUN: 108 mg/dL — AB (ref 6–20)
CHLORIDE: 107 mmol/L (ref 101–111)
Calcium, Ion: 1.04 mmol/L — ABNORMAL LOW (ref 1.15–1.40)
Creatinine, Ser: 12.5 mg/dL — ABNORMAL HIGH (ref 0.44–1.00)
Glucose, Bld: 73 mg/dL (ref 65–99)
HEMATOCRIT: 44 % (ref 36.0–46.0)
Hemoglobin: 15 g/dL (ref 12.0–15.0)
POTASSIUM: 6 mmol/L — AB (ref 3.5–5.1)
SODIUM: 137 mmol/L (ref 135–145)
TCO2: 19 mmol/L — ABNORMAL LOW (ref 22–32)

## 2017-11-09 LAB — COMPREHENSIVE METABOLIC PANEL
ALBUMIN: 3.5 g/dL (ref 3.5–5.0)
ALK PHOS: 159 U/L — AB (ref 38–126)
ALT: 19 U/L (ref 14–54)
AST: 10 U/L — AB (ref 15–41)
Anion gap: 17 — ABNORMAL HIGH (ref 5–15)
BILIRUBIN TOTAL: 0.6 mg/dL (ref 0.3–1.2)
BUN: 142 mg/dL — AB (ref 6–20)
CO2: 18 mmol/L — ABNORMAL LOW (ref 22–32)
CREATININE: 11.6 mg/dL — AB (ref 0.44–1.00)
Calcium: 8.5 mg/dL — ABNORMAL LOW (ref 8.9–10.3)
Chloride: 101 mmol/L (ref 101–111)
GFR calc Af Amer: 3 mL/min — ABNORMAL LOW (ref >60)
GFR calc non Af Amer: 3 mL/min — ABNORMAL LOW (ref >60)
GLUCOSE: 75 mg/dL (ref 65–99)
POTASSIUM: 6.1 mmol/L — AB (ref 3.5–5.1)
Sodium: 136 mmol/L (ref 135–145)
TOTAL PROTEIN: 7 g/dL (ref 6.5–8.1)

## 2017-11-09 LAB — GLUCOSE, CAPILLARY: GLUCOSE-CAPILLARY: 71 mg/dL (ref 65–99)

## 2017-11-09 MED ORDER — PSYLLIUM 95 % PO PACK
1.0000 | PACK | Freq: Three times a day (TID) | ORAL | Status: DC | PRN
  Filled 2017-11-09: qty 1

## 2017-11-09 MED ORDER — HEPARIN SODIUM (PORCINE) 5000 UNIT/ML IJ SOLN
5000.0000 [IU] | Freq: Three times a day (TID) | INTRAMUSCULAR | Status: DC
  Administered 2017-11-09 – 2017-11-15 (×14): 5000 [IU] via SUBCUTANEOUS
  Filled 2017-11-09 (×14): qty 1

## 2017-11-09 MED ORDER — PRO-STAT SUGAR FREE PO LIQD
30.0000 mL | Freq: Two times a day (BID) | ORAL | Status: DC
  Administered 2017-11-10 – 2017-11-15 (×4): 30 mL via ORAL
  Filled 2017-11-09 (×9): qty 30

## 2017-11-09 MED ORDER — ASPIRIN EC 81 MG PO TBEC
81.0000 mg | DELAYED_RELEASE_TABLET | Freq: Every day | ORAL | Status: DC
  Administered 2017-11-10 – 2017-11-12 (×3): 81 mg via ORAL
  Filled 2017-11-09 (×5): qty 1

## 2017-11-09 MED ORDER — INSULIN ASPART 100 UNIT/ML ~~LOC~~ SOLN
0.0000 [IU] | Freq: Every day | SUBCUTANEOUS | Status: DC
  Administered 2017-11-13: 3 [IU] via SUBCUTANEOUS

## 2017-11-09 MED ORDER — RENA-VITE PO TABS
1.0000 | ORAL_TABLET | Freq: Every day | ORAL | Status: DC
  Administered 2017-11-10 – 2017-11-15 (×6): 1 via ORAL
  Filled 2017-11-09 (×7): qty 1

## 2017-11-09 MED ORDER — HYDRALAZINE HCL 20 MG/ML IJ SOLN
5.0000 mg | INTRAMUSCULAR | Status: DC | PRN
  Administered 2017-11-10 (×2): 5 mg via INTRAVENOUS
  Filled 2017-11-09 (×2): qty 1

## 2017-11-09 MED ORDER — SODIUM POLYSTYRENE SULFONATE 15 GM/60ML PO SUSP
30.0000 g | Freq: Once | ORAL | Status: AC
  Administered 2017-11-09: 30 g via ORAL
  Filled 2017-11-09: qty 120

## 2017-11-09 MED ORDER — PANTOPRAZOLE SODIUM 40 MG PO TBEC
40.0000 mg | DELAYED_RELEASE_TABLET | Freq: Every day | ORAL | Status: DC
  Administered 2017-11-10 – 2017-11-12 (×3): 40 mg via ORAL
  Filled 2017-11-09 (×5): qty 1

## 2017-11-09 MED ORDER — INSULIN ASPART 100 UNIT/ML ~~LOC~~ SOLN
0.0000 [IU] | Freq: Three times a day (TID) | SUBCUTANEOUS | Status: DC
  Administered 2017-11-12 – 2017-11-13 (×2): 2 [IU] via SUBCUTANEOUS
  Administered 2017-11-14: 1 [IU] via SUBCUTANEOUS
  Administered 2017-11-15: 2 [IU] via SUBCUTANEOUS

## 2017-11-09 MED ORDER — POLYETHYLENE GLYCOL 3350 17 G PO PACK
17.0000 g | PACK | Freq: Every day | ORAL | Status: DC
  Administered 2017-11-13: 17 g via ORAL
  Filled 2017-11-09 (×4): qty 1

## 2017-11-09 MED ORDER — ACETAMINOPHEN 325 MG PO TABS
650.0000 mg | ORAL_TABLET | Freq: Four times a day (QID) | ORAL | Status: DC | PRN
  Administered 2017-11-09: 650 mg via ORAL
  Filled 2017-11-09: qty 2

## 2017-11-09 MED ORDER — ATORVASTATIN CALCIUM 40 MG PO TABS
40.0000 mg | ORAL_TABLET | Freq: Every day | ORAL | Status: DC
  Administered 2017-11-10 – 2017-11-15 (×6): 40 mg via ORAL
  Filled 2017-11-09 (×7): qty 1

## 2017-11-09 MED ORDER — GABAPENTIN 100 MG PO CAPS
100.0000 mg | ORAL_CAPSULE | Freq: Every day | ORAL | Status: DC
  Administered 2017-11-09 – 2017-11-12 (×3): 100 mg via ORAL
  Filled 2017-11-09 (×4): qty 1

## 2017-11-09 MED ORDER — CITALOPRAM HYDROBROMIDE 20 MG PO TABS
10.0000 mg | ORAL_TABLET | Freq: Every day | ORAL | Status: DC
  Administered 2017-11-10 – 2017-11-15 (×6): 10 mg via ORAL
  Filled 2017-11-09 (×8): qty 1

## 2017-11-09 MED ORDER — ZOLPIDEM TARTRATE 5 MG PO TABS
5.0000 mg | ORAL_TABLET | Freq: Every evening | ORAL | Status: DC | PRN
  Administered 2017-11-09: 5 mg via ORAL
  Filled 2017-11-09 (×3): qty 1

## 2017-11-09 MED ORDER — CARVEDILOL 3.125 MG PO TABS
3.1250 mg | ORAL_TABLET | Freq: Two times a day (BID) | ORAL | Status: DC
  Administered 2017-11-10 – 2017-11-15 (×9): 3.125 mg via ORAL
  Filled 2017-11-09 (×10): qty 1

## 2017-11-09 MED ORDER — HYDRALAZINE HCL 20 MG/ML IJ SOLN
10.0000 mg | Freq: Once | INTRAMUSCULAR | Status: AC
  Administered 2017-11-09: 10 mg via INTRAVENOUS
  Filled 2017-11-09: qty 1

## 2017-11-09 MED ORDER — NEPRO/CARBSTEADY PO LIQD
237.0000 mL | Freq: Every day | ORAL | Status: DC
  Administered 2017-11-10 – 2017-11-15 (×4): 237 mL via ORAL
  Filled 2017-11-09 (×7): qty 237

## 2017-11-09 MED ORDER — CALCIUM ACETATE (PHOS BINDER) 667 MG PO CAPS
2001.0000 mg | ORAL_CAPSULE | Freq: Three times a day (TID) | ORAL | Status: DC
  Administered 2017-11-11 – 2017-11-15 (×8): 2001 mg via ORAL
  Filled 2017-11-09 (×8): qty 3

## 2017-11-09 MED ORDER — ONDANSETRON HCL 4 MG/2ML IJ SOLN
4.0000 mg | Freq: Three times a day (TID) | INTRAMUSCULAR | Status: DC | PRN
  Administered 2017-11-09 – 2017-11-11 (×3): 4 mg via INTRAVENOUS
  Filled 2017-11-09 (×2): qty 2

## 2017-11-09 MED ORDER — SENNOSIDES-DOCUSATE SODIUM 8.6-50 MG PO TABS
1.0000 | ORAL_TABLET | Freq: Every evening | ORAL | Status: DC | PRN
  Filled 2017-11-09 (×3): qty 1

## 2017-11-09 NOTE — Consult Note (Signed)
Renal Service Consult Note [facility]  Deborah Hendricks 11/09/2017 Sol Blazing Requesting Physician:  Dr Hazle Nordmann  [facility] ED  Reason for Consult:  ESRD pt with missed HD, hyperkalemia HPI: The patient is a 71 y.o. year-old who presented to ED with gen weakness, fatigues, says she missed her last 4 HD sessions.  Said she didn't feel like going per ED MD.  In ED K 6.0, BUN 108 and Creat 12.5, CA 8.5 and WBC 5, afebrile. BP 211/95.   Asked to see for ESRD.    Pt is tearful, says her two grown children live far away and they don't come to see her anymore.  She lives in a nursing home and doesn't have many friends or family here.  She is thinking about moving back to [place] where she is from and still has relatives.  She missed 4 HD sessions because she didn't want to go.  She says the SW at her SNF was supposed to be looking for a SNF in [place] for her to go to.   She denies any SOB, edema, CP, abd pain, n/v/d.     Old chart: - feb 2017 > L foot ulcer, angiography of LLE per [facility] > L AKA by [facility] - march 2018 > TIA, ESRD, DM , HTN - july 2018 > bleeding from new HD cath site, due to Eliquis, resolved. ESRD , PVD w necrotic R great toe changes, seen by [facility]. Afib, hx PE.  - aug 2018 > R LE angiogram w/ PTA of peroneal and of R popliteal arteries  - aug 2018 > gangrene R great toe , amputation R great toe, esrd on HD   ROS  denies CP  no joint pain   no HA  no blurry vision  no rash  no diarrhea  no nausea/ vomiting  no dysuria  no difficulty voiding  no change in urine color    Past Medical History  Past Medical History:  Diagnosis Date  . Anemia   . Anxiety   . CHF (congestive heart failure) ([place])   . Constipation   . Critical lower limb ischemia   . Depression   . ESRD on dialysis [facility])    "TTS; [place]" (11/27/2016)  . GERD (gastroesophageal reflux disease)   . Heart murmur   . HLD (hyperlipidemia)   . Hypertension   . Memory loss   . Peripheral  vascular disease ([place])   . Refusal of blood transfusions as patient is Jehovah's Witness   . Seizures ([place]) 04/2015   s/p CVA.  [name] Sleep apnea    Noncompliant with CPAP.  [name] Stroke ([place]) 2015  . Type II diabetes mellitus ([place])    Past Surgical History  Past Surgical History:  Procedure Laterality Date  . ABDOMINAL AORTOGRAM W/LOWER EXTREMITY Right 06/13/2017   Procedure: Abdominal Aortogram w/Lower Extremity;  Surgeon: Waynetta Sandy, MD;  Location: [place] [facility];  Service: Cardiovascular;  Laterality: Right;  . ABDOMINAL HYSTERECTOMY    . AMPUTATION Left 12/15/2015   Procedure: LEFT ABOVE KNEE AMPUTATION;  Surgeon: Serafina Mitchell, MD;  Location: [place];  Service: Vascular;  Laterality: Left;  . AMPUTATION Right 06/19/2017   Procedure: AMPUTATION right great toe;  Surgeon: Serafina Mitchell, MD;  Location: [place];  Service: Vascular;  Laterality: Right;  . BASCILIC VEIN TRANSPOSITION Right 11/25/2015   Procedure: Right Arm FIRST STAGE BASILIC VEIN TRANSPOSITION;  Surgeon: Serafina Mitchell, MD;  Location: [place];  Service: Vascular;  Laterality: Right;  . BASCILIC VEIN TRANSPOSITION Right 01/19/2016   Procedure: RIGHT SECOND STAGE BASILIC VEIN TRANSPOSITION;  Surgeon: Serafina Mitchell, MD;  Location: [place];  Service: Vascular;  Laterality: Right;  . DILATION AND CURETTAGE OF UTERUS    . EYE SURGERY    . IR FLUORO GUIDE CV LINE RIGHT  06/05/2017  . PERIPHERAL VASCULAR BALLOON ANGIOPLASTY Right 06/13/2017   Procedure: PERIPHERAL VASCULAR BALLOON ANGIOPLASTY;  Surgeon: Waynetta Sandy, MD;  Location: [place] [facility];  Service: Cardiovascular;  Laterality: Right;  POPLITEAL/SFA/PERONEAL  . PERIPHERAL VASCULAR CATHETERIZATION Bilateral 11/28/2015   Procedure: Lower Extremity Angiography;  Surgeon: Lorretta Harp, MD;  Location: [place] [facility];  Service: Cardiovascular;  Laterality: Bilateral;  . PERIPHERAL VASCULAR CATHETERIZATION N/A 11/28/2015   Procedure: Abdominal  Aortogram;  Surgeon: Lorretta Harp, MD;  Location: [place] [facility];  Service: Cardiovascular;  Laterality: N/A;  . PERIPHERAL VASCULAR CATHETERIZATION Right 11/27/2016  . PERIPHERAL VASCULAR CATHETERIZATION N/A 11/27/2016   Procedure: Abdominal Aortogram w/Lower Extremity;  Surgeon: Serafina Mitchell, MD;  Location: [place] [facility];  Service: Cardiovascular;  Laterality: N/A;  . PERIPHERAL VASCULAR CATHETERIZATION N/A 11/27/2016   Procedure: Right arm and central venogram;  Surgeon: Serafina Mitchell, MD;  Location: [place] [facility];  Service: Cardiovascular;  Laterality: N/A;  . PERIPHERAL VASCULAR CATHETERIZATION Right 11/27/2016   Procedure: Peripheral Vascular Atherectomy;  Surgeon: Serafina Mitchell, MD;  Location: [place] [facility];  Service: Cardiovascular;  Laterality: Right;  Rt    sfa, popliteal, tibioperoneal trunk, peroneal  . PERIPHERAL VASCULAR CATHETERIZATION Right 11/27/2016   Procedure: Peripheral Vascular Balloon Angioplasty;  Surgeon: Serafina Mitchell, MD;  Location: [place] [facility];  Service: Cardiovascular;  Laterality: Right;  Tibial peroneal  . RETINOPATHY SURGERY Bilateral   . TOE AMPUTATION Right    2nd, 3rd, 4th, 5th digits  . TONSILLECTOMY     Family History  Family History  Problem Relation Age of Onset  . Asthma Mother   . Cancer Sister    Social History  reports that she quit smoking about 2 years ago. Her smoking use included cigarettes. She has a 12.75 pack-year smoking history. she has never used smokeless tobacco. She reports that she drinks alcohol. She reports that she does not use drugs. Allergies  Allergies  Allergen Reactions  . Penicillins Hives, Itching, Rash and Other (See Comments)    Has patient had a PCN reaction causing immediate rash, facial/tongue/throat swelling, SOB or lightheadedness with hypotension: Yes Has patient had a PCN reaction causing severe rash involving mucus membranes or skin necrosis:unknown MAR source Has patient had a  PCN reaction that required hospitalization:unknown MAR source Has patient had a PCN reaction occurring within the last 10 years:unknown MAR source If all of the above answers are "NO", then may proceed with Cephalosporin use.  . Sulfa Antibiotics     UNSPECIFIED REACTION   . Amoxicillin Hives, Itching and Rash    Has patient had a PCN reaction causing immediate rash, facial/tongue/throat swelling, SOB or lightheadedness with hypotension:unknown MAR source Has patient had a PCN reaction causing severe rash involving mucus membranes or skin necrosis:unknown MAR source Has patient had a PCN reaction that required hospitalization:unknown MAR source Has patient had a PCN reaction occurring within the last 10 years: unknown MAR source If all of the above answers are "NO", then may proceed with Cephalosporin use.   . Latex Rash   Home medications Prior to  Admission medications   Medication Sig Start Date End Date Taking? Authorizing Provider  Amino Acids-Protein Hydrolys (FEEDING SUPPLEMENT, PRO-STAT SUGAR FREE 64,) LIQD Take 30 mLs by mouth 2 (two) times daily. (0900 & 2100) 06/14/17   Alvia Grove, PA-C  apixaban (ELIQUIS) 2.5 MG TABS tablet Take 1 tablet (2.5 mg total) by mouth 2 (two) times daily. 06/20/17   Alvia Grove, PA-C  aspirin EC 81 MG tablet Take 81 mg by mouth daily.    [provider]  atorvastatin (LIPITOR) 40 MG tablet Take 1 tablet (40 mg total) by mouth daily. 03/07/17   Briscoe Deutscher, DO  calcium acetate (PHOSLO) 667 MG capsule Take 2,001 mg by mouth 3 (three) times daily with meals.     [provider]  carvedilol (COREG) 3.125 MG tablet Take 1 tablet (3.125 mg total) by mouth 2 (two) times daily with a meal. 07/30/16   Reyne Dumas, MD  citalopram (CELEXA) 10 MG tablet Take 10 mg by mouth daily. (0900) 09/27/16   [provider]  esomeprazole (NEXIUM) 20 MG capsule Take 1 capsule (20 mg total) by mouth daily. 03/11/17   Briscoe Deutscher, DO   gabapentin (NEURONTIN) 100 MG capsule Take 1 capsule (100 mg total) by mouth at bedtime. 06/20/17   Alvia Grove, PA-C  insulin aspart (NOVOLOG) 100 UNIT/ML injection Inject 2-10 Units into the skin 3 (three) times daily as needed for high blood sugar. 151-200=2 units, 201-250=4 units, 251-300=6 units, 301-350=8 units, 351-400=10 units, >400=10 units and Call MD    [provider]  multivitamin (RENA-VIT) TABS tablet Take 1 tablet by mouth daily. (0900)    [provider]  Nutritional Supplements (FEEDING SUPPLEMENT, NEPRO CARB STEADY,) LIQD Take 237 mLs by mouth daily with lunch. 06/20/17   Alvia Grove, PA-C  ondansetron (ZOFRAN ODT) 4 MG disintegrating tablet Take 1 tablet (4 mg total) by mouth every 8 (eight) hours as needed for nausea or vomiting. 08/30/17   Gareth Morgan, MD  polyethylene glycol powder (GLYCOLAX/MIRALAX) powder Take 17 g by mouth at bedtime. HOLD FOR LOOSE STOOLS     [provider]  psyllium (REGULOID) 0.52 g capsule One po tid prn constipation. Patient taking differently: Take 0.52 g by mouth 3 (three) times daily as needed (for constipation.).  03/22/17   Briscoe Deutscher, DO   Liver Function Tests Recent Labs  Lab 11/09/17 1832  AST 10*  ALT 19  ALKPHOS 159*  BILITOT 0.6  PROT 7.0  ALBUMIN 3.5   No results for input(s): LIPASE, AMYLASE in the last 168 hours. CBC Recent Labs  Lab 11/09/17 1832 11/09/17 1852  WBC 5.8  --   HGB 13.4 15.0  HCT 41.2 44.0  MCV 88.0  --   PLT 210  --    Basic Metabolic Panel Recent Labs  Lab 11/09/17 1832 11/09/17 1852  NA 136 137  K 6.1* 6.0*  CL 101 107  CO2 18*  --   GLUCOSE 75 73  BUN 142* 108*  CREATININE 11.60* 12.50*  CALCIUM 8.5*  --    Iron/TIBC/Ferritin/ %Sat No results found for: IRON, TIBC, FERRITIN, IRONPCTSAT  Vitals:   11/09/17 1830 11/09/17 1845 11/09/17 1915 11/09/17 1947  BP: (!) 201/90 (!) 218/100 (!) 236/112 (!) 177/82  Pulse: 69 70 79 75  Resp: 16 14  18    Temp:      TempSrc:      SpO2: 95% 98% (!) 88% 98%   Exam Gen no distress, crying No  rash, cyanosis or gangrene Sclera anicteric, throat clear  No jvd or bruits Chest clear bilat RRR no MRG Abd soft ntnd no mass or ascites +bs GU defer MS no joint effusions or deformity Ext R TMA, no LE edema, no wounds or ulcers Neuro is alert, Ox 3 , nf R thigh TDC   Home meds: -coreg 3.125 bid -eliquis 2.5 bid/ ecasa 81 qd -lipitor 40/ phoslo 3 ac/ nexium 20/ MVI/ nepro -prn miralax/ psyllium/ zofran -celexa 10 qd/ neurontin 100 hs -novolog SSI ac 2- 10 units   Dialysis: [facility]  Hep 3000   TDC  ? Dry wt    Impression: 1. Missed HD - depression, loneliness. Azotemic and mildly uremic.  Plan HD tomorrow.  2. ESRD on HD TTS 3. Depression 4. HTN 5. PVD L AK/ R TMA 6. DM2    Plan - will plan HD tomorrow if possible  Kelly Splinter MD [facility] pager [phone]   11/09/2017, 8:03 PM

## 2017-11-09 NOTE — ED Provider Notes (Signed)
[facility] EMERGENCY DEPARTMENT Provider Note   CSN: [id number] Arrival date & time: 11/09/17  1652     History   Chief Complaint Chief Complaint  Patient presents with  . missed dialysis x 4    HPI Deborah Hendricks is a 71 y.o. female.  HPI  The patient is a 71 year old female, she has end-stage renal disease and gets dialysis, she lives in a nursing facility, she has a history of a left lower extremity amputation, her dialysis access is in the right groin.  She also has a known history of diabetes, she has had a couple of strokes and has had seizures after her strokes, known peripheral vascular disease, and congestive heart failure.  The patient reports that she has not wanted to go to dialysis for the last 4 sessions and that she has missed for more than a week.  Over the same timeframe she has had a progressive weakness as well as a feeling of becoming swollen in her one leg, increasingly short of breath though she does not feel like she is in distress.  Because of this progressive weakness she ended up calling for transport today for evaluation for need for dialysis.  The patient cannot give me a good answer as to why she has missed dialysis other than she was not feeling well and did not want to go.,  Coughing, nausea or vomiting and has had no diarrhea.  Past Medical History:  Diagnosis Date  . Anemia   . Anxiety   . CHF (congestive heart failure) ([place])   . Constipation   . Critical lower limb ischemia   . Depression   . ESRD on dialysis [facility])    "TTS; [place]" (11/27/2016)  . GERD (gastroesophageal reflux disease)   . Heart murmur   . HLD (hyperlipidemia)   . Hypertension   . Memory loss   . Peripheral vascular disease ([place])   . Refusal of blood transfusions as patient is Jehovah's Witness   . Seizures ([place]) 04/2015   s/p CVA.  [name] Sleep apnea    Noncompliant with CPAP.  [name] Stroke ([place]) 2015  . Type II diabetes mellitus [facility])     Patient  Active Problem List   Diagnosis Date Noted  . Ischemia of right lower extremity 06/14/2017  . Hyperkalemia 06/05/2017  . Bleeding due to dialysis catheter placement ([place]) 06/05/2017  . History of hysterectomy 03/09/2017  . Former smoker 03/09/2017  . Dysphagia 03/09/2017  . Carotid stenosis 03/09/2017  . Need for assistance due to reduced mobility 03/09/2017  . Complication of vascular access for dialysis 03/09/2017  . Anemia in chronic kidney disease (CKD) 03/09/2017  . Sleep apnea   . Refusal of blood transfusions as patient is Jehovah's Witness   . Constipation   . GERD (gastroesophageal reflux disease)   . HLD (hyperlipidemia)   . TIA (transient ischemic attack) 02/01/2017  . Diabetic ulcer of toe of right foot associated with diabetes mellitus due to underlying condition, limited to breakdown of skin ([place])   . PVD (peripheral vascular disease) ([place]) 11/27/2016  . Blurry vision   . Asymmetric SNHL (sensorineural hearing loss) 06/12/2016  . Peripheral neuropathy 06/11/2016  . History of pulmonary embolism 06/08/2016  . History of CVA with residual deficit 06/08/2016  . History of borderline personality disorder 04/19/2016  . History of medication noncompliance 04/19/2016  . Paroxysmal atrial fibrillation ([place]) 04/19/2016  . Seizure disorder ([place]) 04/19/2016  . Renovascular hypertension 04/13/2016  . ESRD  on dialysis ([place]) 04/11/2016  . Generalized anxiety disorder 04/11/2016  . Major depression, recurrent, chronic ([place]) 04/11/2016  . S/P AKA (above knee amputation) unilateral, left ([place]) 04/11/2016  . PAD (peripheral artery disease) ([place]) 12/15/2015  . Type II diabetes mellitus with neurological manifestations ([place]) 10/21/2015  . Seizures ([place]) 04/13/2015    Past Surgical History:  Procedure Laterality Date  . ABDOMINAL AORTOGRAM W/LOWER EXTREMITY Right 06/13/2017   Procedure: Abdominal Aortogram w/Lower Extremity;  Surgeon: Waynetta Sandy, MD;  Location: [place] [facility];  Service: Cardiovascular;  Laterality: Right;  . ABDOMINAL HYSTERECTOMY    . AMPUTATION Left 12/15/2015   Procedure: LEFT ABOVE KNEE AMPUTATION;  Surgeon: Serafina Mitchell, MD;  Location: [place];  Service: Vascular;  Laterality: Left;  . AMPUTATION Right 06/19/2017   Procedure: AMPUTATION right great toe;  Surgeon: Serafina Mitchell, MD;  Location: [place];  Service: Vascular;  Laterality: Right;  . BASCILIC VEIN TRANSPOSITION Right 11/25/2015   Procedure: Right Arm FIRST STAGE BASILIC VEIN TRANSPOSITION;  Surgeon: Serafina Mitchell, MD;  Location: [place];  Service: Vascular;  Laterality: Right;  . BASCILIC VEIN TRANSPOSITION Right 01/19/2016   Procedure: RIGHT SECOND STAGE BASILIC VEIN TRANSPOSITION;  Surgeon: Serafina Mitchell, MD;  Location: [place];  Service: Vascular;  Laterality: Right;  . DILATION AND CURETTAGE OF UTERUS    . EYE SURGERY    . IR FLUORO GUIDE CV LINE RIGHT  06/05/2017  . PERIPHERAL VASCULAR BALLOON ANGIOPLASTY Right 06/13/2017   Procedure: PERIPHERAL VASCULAR BALLOON ANGIOPLASTY;  Surgeon: Waynetta Sandy, MD;  Location: [place] [facility];  Service: Cardiovascular;  Laterality: Right;  POPLITEAL/SFA/PERONEAL  . PERIPHERAL VASCULAR CATHETERIZATION Bilateral 11/28/2015   Procedure: Lower Extremity Angiography;  Surgeon: Lorretta Harp, MD;  Location: [place] [facility];  Service: Cardiovascular;  Laterality: Bilateral;  . PERIPHERAL VASCULAR CATHETERIZATION N/A 11/28/2015   Procedure: Abdominal Aortogram;  Surgeon: Lorretta Harp, MD;  Location: [place] [facility];  Service: Cardiovascular;  Laterality: N/A;  . PERIPHERAL VASCULAR CATHETERIZATION Right 11/27/2016  . PERIPHERAL VASCULAR CATHETERIZATION N/A 11/27/2016   Procedure: Abdominal Aortogram w/Lower Extremity;  Surgeon: Serafina Mitchell, MD;  Location: [place] [facility];  Service: Cardiovascular;  Laterality: N/A;  . PERIPHERAL VASCULAR CATHETERIZATION N/A 11/27/2016   Procedure: Right arm and central  venogram;  Surgeon: Serafina Mitchell, MD;  Location: [place] [facility];  Service: Cardiovascular;  Laterality: N/A;  . PERIPHERAL VASCULAR CATHETERIZATION Right 11/27/2016   Procedure: Peripheral Vascular Atherectomy;  Surgeon: Serafina Mitchell, MD;  Location: [place] [facility];  Service: Cardiovascular;  Laterality: Right;  Rt    sfa, popliteal, tibioperoneal trunk, peroneal  . PERIPHERAL VASCULAR CATHETERIZATION Right 11/27/2016   Procedure: Peripheral Vascular Balloon Angioplasty;  Surgeon: Serafina Mitchell, MD;  Location: [place] [facility];  Service: Cardiovascular;  Laterality: Right;  Tibial peroneal  . RETINOPATHY SURGERY Bilateral   . TOE AMPUTATION Right    2nd, 3rd, 4th, 5th digits  . TONSILLECTOMY      OB History    No data available       Home Medications    Prior to Admission medications   Medication Sig Start Date End Date Taking? Authorizing Provider  Amino Acids-Protein Hydrolys (FEEDING SUPPLEMENT, PRO-STAT SUGAR FREE 64,) LIQD Take 30 mLs by mouth 2 (two) times daily. (0900 & 2100) 06/14/17   Alvia Grove, PA-C  apixaban (ELIQUIS) 2.5 MG TABS tablet Take 1 tablet (2.5 mg total) by mouth  2 (two) times daily. 06/20/17   Alvia Grove, PA-C  aspirin EC 81 MG tablet Take 81 mg by mouth daily.    [provider]  atorvastatin (LIPITOR) 40 MG tablet Take 1 tablet (40 mg total) by mouth daily. 03/07/17   Briscoe Deutscher, DO  calcium acetate (PHOSLO) 667 MG capsule Take 2,001 mg by mouth 3 (three) times daily with meals.     [provider]  carvedilol (COREG) 3.125 MG tablet Take 1 tablet (3.125 mg total) by mouth 2 (two) times daily with a meal. 07/30/16   Reyne Dumas, MD  citalopram (CELEXA) 10 MG tablet Take 10 mg by mouth daily. (0900) 09/27/16   [provider]  esomeprazole (NEXIUM) 20 MG capsule Take 1 capsule (20 mg total) by mouth daily. 03/11/17   Briscoe Deutscher, DO  gabapentin (NEURONTIN) 100 MG capsule Take 1 capsule (100 mg total) by  mouth at bedtime. 06/20/17   Alvia Grove, PA-C  insulin aspart (NOVOLOG) 100 UNIT/ML injection Inject 2-10 Units into the skin 3 (three) times daily as needed for high blood sugar. 151-200=2 units, 201-250=4 units, 251-300=6 units, 301-350=8 units, 351-400=10 units, >400=10 units and Call MD    [provider]  multivitamin (RENA-VIT) TABS tablet Take 1 tablet by mouth daily. (0900)    [provider]  Nutritional Supplements (FEEDING SUPPLEMENT, NEPRO CARB STEADY,) LIQD Take 237 mLs by mouth daily with lunch. 06/20/17   Alvia Grove, PA-C  ondansetron (ZOFRAN ODT) 4 MG disintegrating tablet Take 1 tablet (4 mg total) by mouth every 8 (eight) hours as needed for nausea or vomiting. 08/30/17   Gareth Morgan, MD  polyethylene glycol powder (GLYCOLAX/MIRALAX) powder Take 17 g by mouth at bedtime. HOLD FOR LOOSE STOOLS     [provider]  psyllium (REGULOID) 0.52 g capsule One po tid prn constipation. Patient taking differently: Take 0.52 g by mouth 3 (three) times daily as needed (for constipation.).  03/22/17   Briscoe Deutscher, DO    Family History Family History  Problem Relation Age of Onset  . Asthma Mother   . Cancer Sister     Social History Social History   Tobacco Use  . Smoking status: Former Smoker    Packs/day: 0.25    Years: 51.00    Pack years: 12.75    Types: Cigarettes    Last attempt to quit: 04/13/2015    Years since quitting: 2.5  . Smokeless tobacco: Never Used  Substance Use Topics  . Alcohol use: Yes    Alcohol/week: 0.0 oz    Comment: 11/27/2016 "stopped in 04/2015"  . Drug use: No     Allergies   Penicillins; Sulfa antibiotics; Amoxicillin; and Latex   Review of Systems Review of Systems  All other systems reviewed and are negative.    Physical Exam Updated Vital Signs BP (!) 165/84   Pulse 78   Temp 98.8 F (37.1 C) (Oral)   Resp 18   SpO2 98%   Physical Exam  Constitutional: She appears well-developed and  well-nourished. No distress.  HENT:  Head: Normocephalic and atraumatic.  Mouth/Throat: Oropharynx is clear and moist. No oropharyngeal exudate.  Eyes: Conjunctivae and EOM are normal. Pupils are equal, round, and reactive to light. Right eye exhibits no discharge. Left eye exhibits no discharge. No scleral icterus.  Neck: Normal range of motion. Neck supple. No JVD present. No thyromegaly present.  Cardiovascular: Normal rate, regular rhythm, normal heart sounds and intact distal pulses. Exam reveals  no gallop and no friction rub.  No murmur heard. Pulmonary/Chest: Effort normal and breath sounds normal. No respiratory distress. She has no wheezes. She has no rales.  Abdominal: Soft. Bowel sounds are normal. She exhibits no distension and no mass. There is no tenderness.  Musculoskeletal: Normal range of motion. She exhibits edema. She exhibits no tenderness.  Peripheral edema to the right lower extremity  Lymphadenopathy:    She has no cervical adenopathy.  Neurological: She is alert. Coordination normal.  The patient speaks slowly but is able to answer my questions, she is not slurring her words, she is able to follow commands including using both of her hands to grip.  Skin: Skin is warm and dry. No rash noted. No erythema.  Psychiatric: She has a normal mood and affect. Her behavior is normal.  Nursing note and vitals reviewed.    ED Treatments / Results  Labs (all labs ordered are listed, but only abnormal results are displayed) Labs Reviewed  CBC - Abnormal; Notable for the following components:      Result Value   RDW 17.9 (*)    All other components within normal limits  COMPREHENSIVE METABOLIC PANEL - Abnormal; Notable for the following components:   Potassium 6.1 (*)    CO2 18 (*)    BUN 142 (*)    Creatinine, Ser 11.60 (*)    Calcium 8.5 (*)    AST 10 (*)    Alkaline Phosphatase 159 (*)    GFR calc non Af Amer 3 (*)    GFR calc Af Amer 3 (*)    Anion gap 17 (*)     All other components within normal limits  I-STAT CHEM 8, ED - Abnormal; Notable for the following components:   Potassium 6.0 (*)    BUN 108 (*)    Creatinine, Ser 12.50 (*)    Calcium, Ion 1.04 (*)    TCO2 19 (*)    All other components within normal limits    EKG  EKG Interpretation  Date/Time:  Saturday November 09 2017 18:15:04 EST Ventricular Rate:  69 PR Interval:    QRS Duration: 103 QT Interval:  455 QTC Calculation: 488 R Axis:   -46 Text Interpretation:  Sinus rhythm Prolonged PR interval Inferior infarct, old Anterior infarct, old Since last tracing rate slower Confirmed by Noemi Chapel [phone]) on 11/09/2017 8:44:18 PM       Radiology Dg Chest Port 1 View  Result Date: 11/09/2017 CLINICAL DATA:  Fatigue and weakness, history of missed dialysis treatments EXAM: PORTABLE CHEST 1 VIEW COMPARISON:  03/26/2017 FINDINGS: Cardiac shadow is mildly enlarged. Aortic calcifications are again seen and stable. Femoral dialysis catheter is noted extending into the right atrium in satisfactory position. The lungs are well aerated with small right-sided pleural effusion new from the prior exam. No significant vascular congestion is noted. No bony abnormality is noted. IMPRESSION: New small right-sided pleural effusion. No other focal abnormality is noted. Electronically Signed   By: Inez Catalina M.D.   On: 11/09/2017 18:28    Procedures Procedures (including critical care time)  Medications Ordered in ED Medications  sodium polystyrene (KAYEXALATE) 15 GM/60ML suspension 30 g (not administered)  hydrALAZINE (APRESOLINE) injection 10 mg (10 mg Intravenous Given 11/09/17 1936)     Initial Impression / Assessment and Plan / ED Course  I have reviewed the triage vital signs and the nursing notes.  Pertinent labs & imaging results that were available during my care of the patient  were reviewed by me and considered in my medical decision making (see chart for details).    The  patient required hydralazine, she had no signs of pulmonary edema, she had a severely elevated creatinine but this is expected given her lack of dialysis.  Hyperkalemia of 6.0 was treated with Kayexalate, I discussed the care with the on-call nephrologist Dr. Jonnie Finner who will provide dialysis tomorrow as well as with the hospitalist Dr. Blaine Hamper who will admit  Final Clinical Impressions(s) / ED Diagnoses   Final diagnoses:  Hyperkalemia  Hypertension, unspecified type      Noemi Chapel, MD 11/09/17 2044

## 2017-11-09 NOTE — H&P (Signed)
History and Physical    Deborah Hendricks LZJ:[id number] DOB: May 23, 1946 DOA: 11/09/2017  Referring MD/NP/PA:   PCP: [name], MD   Patient coming from:  The patient is coming from home.  At baseline, pt is independent for most of ADL. SNF  Assistant living facility   Retirement center.       Chief Complaint: Generalized weakness.  HPI: Deborah Hendricks is a 71 y.o. female with medical history significant of ESRD-on HD, hypertension, hyperlipidemia, diabetes mellitus, stroke, GERD, depression with anxiety, atrial fibrillation not on anticoagulants, OSA not on CPAP, PVD, critical lower limb ischemia, dCHF, s/p of L AKA, who presents with generalized weakness.  Pt states that she missed 4 HD sessions because she didn't want to go. She has a generalized weakness, but denies unilateral numbness or tingling. Extremities. No vision change or hearing loss. Patient does not have chest pain, shortness of breath or cough. She denies nausea, vomiting, diarrhea, abdominal pain or symptoms of UTI. No fever or chills. She lives in a nursing home and doesn't have many friends or family here.  She is thinking about moving back to [place] where she is from and still has relatives.    ED Course: pt was found to have K 6.0, bicarbonate 18, BUN 108 and Creat 12.5, Ca 8.5 and WBC 5, afebrile. Blood pressure elevated at 236/112 which improved to 152/102 after 10 mg of IV hydralazine, chest x-ray is negative for infiltration, but showed a small right-sided pleural effusion. Patient is admitted to telemetry bed as inpatient.  Review of Systems:   General: no fevers, chills, has poor appetite, has fatigue HEENT: no blurry vision, hearing changes or sore throat Respiratory: no dyspnea, coughing, wheezing CV: no chest pain, no palpitations GI: no nausea, vomiting, abdominal pain, diarrhea, constipation GU: no dysuria, burning on urination, increased urinary frequency, hematuria  Ext: has leg edema Neuro: no  unilateral weakness, numbness, or tingling, no vision change or hearing loss Skin: no rash, no skin tear. MSK: No muscle spasm, no deformity, no limitation of range of movement in spin Heme: No easy bruising.  Travel history: No recent long distant travel.  Allergy:  Allergies  Allergen Reactions  . Penicillins Hives, Itching, Rash and Other (See Comments)    Has patient had a PCN reaction causing immediate rash, facial/tongue/throat swelling, SOB or lightheadedness with hypotension: Yes Has patient had a PCN reaction causing severe rash involving mucus membranes or skin necrosis:unknown MAR source Has patient had a PCN reaction that required hospitalization:unknown MAR source Has patient had a PCN reaction occurring within the last 10 years:unknown MAR source If all of the above answers are "NO", then may proceed with Cephalosporin use.  . Sulfa Antibiotics     UNSPECIFIED REACTION   . Amoxicillin Hives, Itching and Rash    Has patient had a PCN reaction causing immediate rash, facial/tongue/throat swelling, SOB or lightheadedness with hypotension:unknown MAR source Has patient had a PCN reaction causing severe rash involving mucus membranes or skin necrosis:unknown MAR source Has patient had a PCN reaction that required hospitalization:unknown MAR source Has patient had a PCN reaction occurring within the last 10 years: unknown MAR source If all of the above answers are "NO", then may proceed with Cephalosporin use.   . Latex Rash    Past Medical History:  Diagnosis Date  . Anemia   . Anxiety   . CHF (congestive heart failure) ([place])   . Constipation   . Critical lower limb ischemia   .  Depression   . ESRD on dialysis [facility])    "TTS; [place]" (11/27/2016)  . GERD (gastroesophageal reflux disease)   . Heart murmur   . HLD (hyperlipidemia)   . Hypertension   . Memory loss   . Peripheral vascular disease ([place])   . Refusal of blood transfusions as patient is Jehovah's Witness    . Seizures ([place]) 04/2015   s/p CVA.  [name] Sleep apnea    Noncompliant with CPAP.  [name] Stroke ([place]) 2015  . Type II diabetes mellitus ([place])     Past Surgical History:  Procedure Laterality Date  . ABDOMINAL AORTOGRAM W/LOWER EXTREMITY Right 06/13/2017   Procedure: Abdominal Aortogram w/Lower Extremity;  Surgeon: Waynetta Sandy, MD;  Location: [place] [facility];  Service: Cardiovascular;  Laterality: Right;  . ABDOMINAL HYSTERECTOMY    . AMPUTATION Left 12/15/2015   Procedure: LEFT ABOVE KNEE AMPUTATION;  Surgeon: Serafina Mitchell, MD;  Location: [place];  Service: Vascular;  Laterality: Left;  . AMPUTATION Right 06/19/2017   Procedure: AMPUTATION right great toe;  Surgeon: Serafina Mitchell, MD;  Location: [place];  Service: Vascular;  Laterality: Right;  . BASCILIC VEIN TRANSPOSITION Right 11/25/2015   Procedure: Right Arm FIRST STAGE BASILIC VEIN TRANSPOSITION;  Surgeon: Serafina Mitchell, MD;  Location: [place];  Service: Vascular;  Laterality: Right;  . BASCILIC VEIN TRANSPOSITION Right 01/19/2016   Procedure: RIGHT SECOND STAGE BASILIC VEIN TRANSPOSITION;  Surgeon: Serafina Mitchell, MD;  Location: [place];  Service: Vascular;  Laterality: Right;  . DILATION AND CURETTAGE OF UTERUS    . EYE SURGERY    . IR FLUORO GUIDE CV LINE RIGHT  06/05/2017  . PERIPHERAL VASCULAR BALLOON ANGIOPLASTY Right 06/13/2017   Procedure: PERIPHERAL VASCULAR BALLOON ANGIOPLASTY;  Surgeon: Waynetta Sandy, MD;  Location: [place] [facility];  Service: Cardiovascular;  Laterality: Right;  POPLITEAL/SFA/PERONEAL  . PERIPHERAL VASCULAR CATHETERIZATION Bilateral 11/28/2015   Procedure: Lower Extremity Angiography;  Surgeon: Lorretta Harp, MD;  Location: [place] [facility];  Service: Cardiovascular;  Laterality: Bilateral;  . PERIPHERAL VASCULAR CATHETERIZATION N/A 11/28/2015   Procedure: Abdominal Aortogram;  Surgeon: Lorretta Harp, MD;  Location: [place] [facility];  Service: Cardiovascular;  Laterality: N/A;  .  PERIPHERAL VASCULAR CATHETERIZATION Right 11/27/2016  . PERIPHERAL VASCULAR CATHETERIZATION N/A 11/27/2016   Procedure: Abdominal Aortogram w/Lower Extremity;  Surgeon: Serafina Mitchell, MD;  Location: [place] [facility];  Service: Cardiovascular;  Laterality: N/A;  . PERIPHERAL VASCULAR CATHETERIZATION N/A 11/27/2016   Procedure: Right arm and central venogram;  Surgeon: Serafina Mitchell, MD;  Location: [place] [facility];  Service: Cardiovascular;  Laterality: N/A;  . PERIPHERAL VASCULAR CATHETERIZATION Right 11/27/2016   Procedure: Peripheral Vascular Atherectomy;  Surgeon: Serafina Mitchell, MD;  Location: [place] [facility];  Service: Cardiovascular;  Laterality: Right;  Rt    sfa, popliteal, tibioperoneal trunk, peroneal  . PERIPHERAL VASCULAR CATHETERIZATION Right 11/27/2016   Procedure: Peripheral Vascular Balloon Angioplasty;  Surgeon: Serafina Mitchell, MD;  Location: [place] [facility];  Service: Cardiovascular;  Laterality: Right;  Tibial peroneal  . RETINOPATHY SURGERY Bilateral   . TOE AMPUTATION Right    2nd, 3rd, 4th, 5th digits  . TONSILLECTOMY      Social History:  reports that she quit smoking about 2 years ago. Her smoking use included cigarettes. She has a 12.75 pack-year smoking history. she has never used smokeless tobacco. She reports that she drinks alcohol. She reports that she  does not use drugs.  Family History:  Family History  Problem Relation Age of Onset  . Asthma Mother   . Cancer Sister      Prior to Admission medications   Medication Sig Start Date End Date Taking? Authorizing Provider  Amino Acids-Protein Hydrolys (FEEDING SUPPLEMENT, PRO-STAT SUGAR FREE 64,) LIQD Take 30 mLs by mouth 2 (two) times daily. (0900 & 2100) 06/14/17   Alvia Grove, PA-C  apixaban (ELIQUIS) 2.5 MG TABS tablet Take 1 tablet (2.5 mg total) by mouth 2 (two) times daily. 06/20/17   Alvia Grove, PA-C  aspirin EC 81 MG tablet Take 81 mg by mouth daily.    [provider]    atorvastatin (LIPITOR) 40 MG tablet Take 1 tablet (40 mg total) by mouth daily. 03/07/17   Briscoe Deutscher, DO  calcium acetate (PHOSLO) 667 MG capsule Take 2,001 mg by mouth 3 (three) times daily with meals.     [provider]  carvedilol (COREG) 3.125 MG tablet Take 1 tablet (3.125 mg total) by mouth 2 (two) times daily with a meal. 07/30/16   Reyne Dumas, MD  citalopram (CELEXA) 10 MG tablet Take 10 mg by mouth daily. (0900) 09/27/16   [provider]  esomeprazole (NEXIUM) 20 MG capsule Take 1 capsule (20 mg total) by mouth daily. 03/11/17   Briscoe Deutscher, DO  gabapentin (NEURONTIN) 100 MG capsule Take 1 capsule (100 mg total) by mouth at bedtime. 06/20/17   Alvia Grove, PA-C  insulin aspart (NOVOLOG) 100 UNIT/ML injection Inject 2-10 Units into the skin 3 (three) times daily as needed for high blood sugar. 151-200=2 units, 201-250=4 units, 251-300=6 units, 301-350=8 units, 351-400=10 units, >400=10 units and Call MD    [provider]  multivitamin (RENA-VIT) TABS tablet Take 1 tablet by mouth daily. (0900)    [provider]  Nutritional Supplements (FEEDING SUPPLEMENT, NEPRO CARB STEADY,) LIQD Take 237 mLs by mouth daily with lunch. 06/20/17   Alvia Grove, PA-C  ondansetron (ZOFRAN ODT) 4 MG disintegrating tablet Take 1 tablet (4 mg total) by mouth every 8 (eight) hours as needed for nausea or vomiting. 08/30/17   Gareth Morgan, MD  polyethylene glycol powder (GLYCOLAX/MIRALAX) powder Take 17 g by mouth at bedtime. HOLD FOR LOOSE STOOLS     [provider]  psyllium (REGULOID) 0.52 g capsule One po tid prn constipation. Patient taking differently: Take 0.52 g by mouth 3 (three) times daily as needed (for constipation.).  03/22/17   Briscoe Deutscher, DO    Physical Exam: Vitals:   11/09/17 1947 11/09/17 2000 11/09/17 2015 11/09/17 2030  BP: (!) 177/82 (!) 152/102 (!) 154/72 (!) 165/84  Pulse: 75 76 74 78  Resp: 18   18  Temp:       TempSrc:      SpO2: 98% 99% 98% 98%   General: Not in acute distress HEENT:       Eyes: PERRL, EOMI, no scleral icterus.       ENT: No discharge from the ears and nose, no pharynx injection, no tonsillar enlargement.        Neck: No JVD, no bruit, no mass felt. Heme: No neck lymph node enlargement. Cardiac: D9/M4, RRR, 2/6 systolic murmurs, No gallops or rubs. Respiratory: No rales, wheezing, rhonchi or rubs. GI: Soft, nondistended, nontender, no rebound pain, no organomegaly, BS present. GU: No hematuria Ext: 1+ pitting leg edema bilaterally.  Musculoskeletal: No joint deformities, No joint redness or warmth, no limitation  of ROM in spin. Skin: No rashes.  Neuro: Alert, oriented X3, cranial nerves II-XII grossly intact, moves all extremities normally. Psych: Patient is not psychotic, no suicidal or hemocidal ideation.  Labs on Admission: I have personally reviewed following labs and imaging studies  CBC: Recent Labs  Lab 11/09/17 1832 11/09/17 1852  WBC 5.8  --   HGB 13.4 15.0  HCT 41.2 44.0  MCV 88.0  --   PLT 210  --    Basic Metabolic Panel: Recent Labs  Lab 11/09/17 1832 11/09/17 1852  NA 136 137  K 6.1* 6.0*  CL 101 107  CO2 18*  --   GLUCOSE 75 73  BUN 142* 108*  CREATININE 11.60* 12.50*  CALCIUM 8.5*  --    GFR: CrCl cannot be calculated (Unknown ideal weight.). Liver Function Tests: Recent Labs  Lab 11/09/17 1832  AST 10*  ALT 19  ALKPHOS 159*  BILITOT 0.6  PROT 7.0  ALBUMIN 3.5   No results for input(s): LIPASE, AMYLASE in the last 168 hours. No results for input(s): AMMONIA in the last 168 hours. Coagulation Profile: No results for input(s): INR, PROTIME in the last 168 hours. Cardiac Enzymes: No results for input(s): CKTOTAL, CKMB, CKMBINDEX, TROPONINI in the last 168 hours. BNP (last 3 results) No results for input(s): PROBNP in the last 8760 hours. HbA1C: No results for input(s): HGBA1C in the last 72 hours. CBG: No results for  input(s): GLUCAP in the last 168 hours. Lipid Profile: No results for input(s): CHOL, HDL, LDLCALC, TRIG, CHOLHDL, LDLDIRECT in the last 72 hours. Thyroid Function Tests: No results for input(s): TSH, T4TOTAL, FREET4, T3FREE, THYROIDAB in the last 72 hours. Anemia Panel: No results for input(s): VITAMINB12, FOLATE, FERRITIN, TIBC, IRON, RETICCTPCT in the last 72 hours. Urine analysis:    Component Value Date/Time   COLORURINE YELLOW 01/28/2014 1129   APPEARANCEUR CLEAR 01/28/2014 1129   LABSPEC 1.012 01/28/2014 1129   PHURINE 6.0 01/28/2014 1129   GLUCOSEU NEGATIVE 01/28/2014 1129   HGBUR NEGATIVE 01/28/2014 1129   BILIRUBINUR NEGATIVE 01/28/2014 1129   KETONESUR NEGATIVE 01/28/2014 1129   PROTEINUR 100 (A) 01/28/2014 1129   UROBILINOGEN 0.2 01/28/2014 1129   NITRITE NEGATIVE 01/28/2014 1129   LEUKOCYTESUR TRACE (A) 01/28/2014 1129   Sepsis Labs: @LABRCNTIP (procalcitonin:4,lacticidven:4) )No results found for this or any previous visit (from the past 240 hour(s)).   Radiological Exams on Admission: Dg Chest Port 1 View  Result Date: 11/09/2017 CLINICAL DATA:  Fatigue and weakness, history of missed dialysis treatments EXAM: PORTABLE CHEST 1 VIEW COMPARISON:  03/26/2017 FINDINGS: Cardiac shadow is mildly enlarged. Aortic calcifications are again seen and stable. Femoral dialysis catheter is noted extending into the right atrium in satisfactory position. The lungs are well aerated with small right-sided pleural effusion new from the prior exam. No significant vascular congestion is noted. No bony abnormality is noted. IMPRESSION: New small right-sided pleural effusion. No other focal abnormality is noted. Electronically Signed   By: Inez Catalina M.D.   On: 11/09/2017 18:28     EKG: Independently reviewed. Sinus rhythm, QTC 488, LAD, no T-wave peaking.   Assessment/Plan Principal Problem:   Hyperkalemia Active Problems:   Type II diabetes mellitus with neurological  manifestations (HCC)   ESRD on dialysis (HCC)   Paroxysmal atrial fibrillation (HCC)   S/P AKA (above knee amputation) unilateral, left (HCC)   TIA (transient ischemic attack)   HLD (hyperlipidemia)   Hypertensive urgency   Hyperkalemia: K=6.0. No EKG change. Due to missing  HD.  -admit to tele bed as inpt -renal was consulted for HD  -give Kayexalate 30 g 1 -will consult to SW since pt wants to move  ESRD (end stage renal disease) on dialysis (TTS): missed 4 sessions of HD. Pt is azotemic and mildly uremic. --renal, Dr. Jonnie Finner was consulted for HD   Type II diabetes mellitus with neurological manifestations [facility]): Last A1c 7.6, not well controled. Patient is taking novolog at home -SSI  Atrial Fibrillation: CHA2DS2-VASc Score is 8, needs oral anticoagulation. Patient was on Eliquis before which is discontinued recently. Not sure why her blood thinners discontinued. Heart rate is well controlled. -continue coreg  TIA and stroke -on ASA and lipitor   HLD (hyperlipidemia) -lipitor  Hypertensive urgency: Blood pressure was 2 3060/112, which improved to 152/102, to 10 mg of IV hydralazine. -IV hydralazine when necessary -Continue Coreg -will add amlodipine 10 mg daily     DVT ppx: SQ Heparin          Code Status: DNR (I discussed with patient, and explained the meaning of CODE STATUS. Patient wants to be DNR) Family Communication: None at bed side.      Disposition Plan:  Anticipate discharge back to previous home environment Consults called: renal, Dr. Jonnie Finner   Admission status:  Inpatient/tele     Date of Service 11/09/2017    Ivor Costa [facility] Pager [phone]  If 7PM-7AM, please contact night-coverage [email or web address] Password [facility] 11/09/2017, 9:16 PM

## 2017-11-09 NOTE — ED Triage Notes (Addendum)
patiet arrived by [facility] for fatigue and weakness after reporting that she has missed dialysis x 4 treatments. No CP. Alert and oriented, no SOB CBG 96

## 2017-11-10 ENCOUNTER — Other Ambulatory Visit: Payer: Self-pay

## 2017-11-10 DIAGNOSIS — I10 Essential (primary) hypertension: Secondary | ICD-10-CM

## 2017-11-10 LAB — CBC
HEMATOCRIT: 42 % (ref 36.0–46.0)
HEMOGLOBIN: 13.6 g/dL (ref 12.0–15.0)
MCH: 28.3 pg (ref 26.0–34.0)
MCHC: 32.4 g/dL (ref 30.0–36.0)
MCV: 87.5 fL (ref 78.0–100.0)
Platelets: 224 10*3/uL (ref 150–400)
RBC: 4.8 MIL/uL (ref 3.87–5.11)
RDW: 17.9 % — AB (ref 11.5–15.5)
WBC: 5.6 10*3/uL (ref 4.0–10.5)

## 2017-11-10 LAB — RENAL FUNCTION PANEL
ANION GAP: 17 — AB (ref 5–15)
Albumin: 3.3 g/dL — ABNORMAL LOW (ref 3.5–5.0)
BUN: 144 mg/dL — AB (ref 6–20)
CHLORIDE: 102 mmol/L (ref 101–111)
CO2: 19 mmol/L — ABNORMAL LOW (ref 22–32)
Calcium: 8.7 mg/dL — ABNORMAL LOW (ref 8.9–10.3)
Creatinine, Ser: 11.6 mg/dL — ABNORMAL HIGH (ref 0.44–1.00)
GFR calc Af Amer: 3 mL/min — ABNORMAL LOW (ref >60)
GFR calc non Af Amer: 3 mL/min — ABNORMAL LOW (ref >60)
GLUCOSE: 107 mg/dL — AB (ref 65–99)
PHOSPHORUS: 6.2 mg/dL — AB (ref 2.5–4.6)
POTASSIUM: 6.2 mmol/L — AB (ref 3.5–5.1)
Sodium: 138 mmol/L (ref 135–145)

## 2017-11-10 LAB — GLUCOSE, CAPILLARY
GLUCOSE-CAPILLARY: 114 mg/dL — AB (ref 65–99)
GLUCOSE-CAPILLARY: 142 mg/dL — AB (ref 65–99)
GLUCOSE-CAPILLARY: 93 mg/dL (ref 65–99)
Glucose-Capillary: 97 mg/dL (ref 65–99)

## 2017-11-10 MED ORDER — HYDROCODONE-ACETAMINOPHEN 5-325 MG PO TABS
1.0000 | ORAL_TABLET | Freq: Four times a day (QID) | ORAL | Status: DC | PRN
  Administered 2017-11-14: 1 via ORAL
  Filled 2017-11-10 (×3): qty 1
  Filled 2017-11-10 (×2): qty 2

## 2017-11-10 MED ORDER — AMLODIPINE BESYLATE 10 MG PO TABS
10.0000 mg | ORAL_TABLET | Freq: Every day | ORAL | Status: DC
  Administered 2017-11-10 – 2017-11-15 (×6): 10 mg via ORAL
  Filled 2017-11-10 (×7): qty 1

## 2017-11-10 MED ORDER — PROMETHAZINE HCL 25 MG/ML IJ SOLN
12.5000 mg | Freq: Once | INTRAMUSCULAR | Status: AC
  Administered 2017-11-10: 12.5 mg via INTRAVENOUS
  Filled 2017-11-10: qty 1

## 2017-11-10 NOTE — Progress Notes (Signed)
Patient became nauseous after drinking about half of her nepro and taking her prostat. Began vomiting. Received call from central telemetry that patient had bradycardia to 35. Dr. Wyline Copas notified. Discussed elevated bp prior to episode and after. Orders to repeat hydralazine and ok to give prn ondansetron IV. Received call from central telemetry to f/u with patient status. Central telemetry stated that patient appeared to go into a complete heart block, then 2nd degree, but now has returned to sinus. Dr. Wyline Copas notified. Will continue to monitor. Bartholomew Crews, RN

## 2017-11-10 NOTE — Progress Notes (Signed)
PROGRESS NOTE    Deborah Hendricks  ZSW:[id number] DOB: 1946-05-02 DOA: 11/09/2017 PCP: [name], MD    Brief Narrative:  71 y.o. female with medical history significant of ESRD-on HD, hypertension, hyperlipidemia, diabetes mellitus, stroke, GERD, depression with anxiety, atrial fibrillation not on anticoagulants, OSA not on CPAP, PVD, critical lower limb ischemia, dCHF, s/p of L AKA, who presents with generalized weakness.  Pt states that she missed 4 HD sessions because she didn't want to go. She has a generalized weakness, but denies unilateral numbness or tingling. Extremities. No vision change or hearing loss. Patient does not have chest pain, shortness of breath or cough. She denies nausea, vomiting, diarrhea, abdominal pain or symptoms of UTI. No fever or chills. She lives in a nursing home and doesn't have many friends or family here. She is thinking about moving back to [place] where she is from and still has relatives.    ED Course: pt was found to have K 6.0, bicarbonate 18, BUN 108 and Creat 12.5, Ca 8.5 and WBC 5, afebrile. Blood pressure elevated at 236/112 which improved to 152/102 after 10 mg of IV hydralazine, chest x-ray is negative for infiltration, but showed a small right-sided pleural effusion. Patient is admitted to telemetry bed as inpatient.    Assessment & Plan:   Principal Problem:   Hyperkalemia Active Problems:   Type II diabetes mellitus with neurological manifestations (HCC)   ESRD on dialysis (HCC)   Paroxysmal atrial fibrillation (HCC)   S/P AKA (above knee amputation) unilateral, left (HCC)   TIA (transient ischemic attack)   HLD (hyperlipidemia)   Hypertensive urgency  Hyperkalemia: At time of admission, K=6.0 without EKG change. Due to missing HD.  -Nephrology consulted -Pt underwent HD on 12/30 with plan for another HD session 12/31  ESRD (end stage renal disease) on dialysis (TTS): missed 4 sessions of HD. Pt presented azotemic and  mildly uremic. -Nephrology consulted with HD per above  Type II diabetes mellitus with neurological manifestations [facility]): Last A1c 7.6, not well controled. Patient is taking novolog at home -continue with SSI coverage -Glucose stable  Atrial Fibrillation: CHA2DS2-VASc Score is 8, needs oral anticoagulation. Patient was on Eliquis before which is discontinued recently. Unclear why anticoagulation had been discontinued. Currently rate controlled -continue coreg as tolerated  TIA and stroke -continue ASA and lipitor as tolerated   HLD (hyperlipidemia) -continue lipitor as tolerated  Hypertensive urgency: Presenting blood pressure noted to be poorly controlled, which improved with IV hydralazine. -will continue IV hydralazine when necessary -Continue Coreg -amlodipine 10 mg daily was added at time of admit   DVT prophylaxis: Heparin subQ Code Status: DNR Family Communication: Pt in room, family not at bedside Disposition Plan: Uncertain at this time  Consultants:   nephrology  Procedures:     Antimicrobials: Anti-infectives (From admission, onward)   None       Subjective: Without complaints at this time  Objective: Vitals:   11/10/17 1452 11/10/17 1547 11/10/17 1600 11/10/17 1716  BP: (!) 188/86 (!) 203/98 (!) 153/71 (!) 142/64  Pulse:  90 83 84  Resp:    18  Temp:    98 F (36.7 C)  TempSrc:    Oral  SpO2:    100%  Weight:      Height:        Intake/Output Summary (Last 24 hours) at 11/10/2017 1847 Last data filed at 11/10/2017 1555 Gross per 24 hour  Intake 600 ml  Output 2450 ml  Net -1850  ml   Filed Weights   11/09/17 2249 11/10/17 1015 11/10/17 1328  Weight: 69.8 kg (153 lb 14.1 oz) 70.5 kg (155 lb 6.8 oz) 68.5 kg (151 lb 0.2 oz)    Examination:  General exam: Appears calm and comfortable  Respiratory system: Clear to auscultation. Respiratory effort normal. Cardiovascular system: S1 & S2 heard, RRR Gastrointestinal system: Abdomen is  nondistended, soft and nontender. No organomegaly or masses felt. Normal bowel sounds heard. Central nervous system: Alert and oriented. No focal neurological deficits. Extremities: Symmetric 5 x 5 power. Skin: No rashes, lesions Psychiatry: Judgement and insight appear normal. Mood & affect appropriate.   Data Reviewed: I have personally reviewed following labs and imaging studies  CBC: Recent Labs  Lab 11/09/17 1832 11/09/17 1852 11/10/17 0753  WBC 5.8  --  5.6  HGB 13.4 15.0 13.6  HCT 41.2 44.0 42.0  MCV 88.0  --  87.5  PLT 210  --  818   Basic Metabolic Panel: Recent Labs  Lab 11/09/17 1832 11/09/17 1852 11/10/17 0753  NA 136 137 138  K 6.1* 6.0* 6.2*  CL 101 107 102  CO2 18*  --  19*  GLUCOSE 75 73 107*  BUN 142* 108* 144*  CREATININE 11.60* 12.50* 11.60*  CALCIUM 8.5*  --  8.7*  PHOS  --   --  6.2*   GFR: Estimated Creatinine Clearance: 4.3 mL/min (A) (by C-G formula based on SCr of 11.6 mg/dL (H)). Liver Function Tests: Recent Labs  Lab 11/09/17 1832 11/10/17 0753  AST 10*  --   ALT 19  --   ALKPHOS 159*  --   BILITOT 0.6  --   PROT 7.0  --   ALBUMIN 3.5 3.3*   No results for input(s): LIPASE, AMYLASE in the last 168 hours. No results for input(s): AMMONIA in the last 168 hours. Coagulation Profile: No results for input(s): INR, PROTIME in the last 168 hours. Cardiac Enzymes: No results for input(s): CKTOTAL, CKMB, CKMBINDEX, TROPONINI in the last 168 hours. BNP (last 3 results) No results for input(s): PROBNP in the last 8760 hours. HbA1C: No results for input(s): HGBA1C in the last 72 hours. CBG: Recent Labs  Lab 11/09/17 2259 11/10/17 0745 11/10/17 1458 11/10/17 1629  GLUCAP 71 97 114* 142*   Lipid Profile: No results for input(s): CHOL, HDL, LDLCALC, TRIG, CHOLHDL, LDLDIRECT in the last 72 hours. Thyroid Function Tests: No results for input(s): TSH, T4TOTAL, FREET4, T3FREE, THYROIDAB in the last 72 hours. Anemia Panel: No results  for input(s): VITAMINB12, FOLATE, FERRITIN, TIBC, IRON, RETICCTPCT in the last 72 hours. Sepsis Labs: No results for input(s): PROCALCITON, LATICACIDVEN in the last 168 hours.  No results found for this or any previous visit (from the past 240 hour(s)).   Radiology Studies: Dg Chest Port 1 View  Result Date: 11/09/2017 CLINICAL DATA:  Fatigue and weakness, history of missed dialysis treatments EXAM: PORTABLE CHEST 1 VIEW COMPARISON:  03/26/2017 FINDINGS: Cardiac shadow is mildly enlarged. Aortic calcifications are again seen and stable. Femoral dialysis catheter is noted extending into the right atrium in satisfactory position. The lungs are well aerated with small right-sided pleural effusion new from the prior exam. No significant vascular congestion is noted. No bony abnormality is noted. IMPRESSION: New small right-sided pleural effusion. No other focal abnormality is noted. Electronically Signed   By: Inez Catalina M.D.   On: 11/09/2017 18:28    Scheduled Meds: . amLODipine  10 mg Oral Daily  . aspirin EC  81 mg Oral Daily  . atorvastatin  40 mg Oral Daily  . calcium acetate  2,001 mg Oral TID WC  . carvedilol  3.125 mg Oral BID WC  . citalopram  10 mg Oral Daily  . feeding supplement (NEPRO CARB STEADY)  237 mL Oral Q lunch  . feeding supplement (PRO-STAT SUGAR FREE 64)  30 mL Oral BID  . gabapentin  100 mg Oral QHS  . heparin injection (subcutaneous)  5,000 Units Subcutaneous Q8H  . insulin aspart  0-5 Units Subcutaneous QHS  . insulin aspart  0-9 Units Subcutaneous TID WC  . multivitamin  1 tablet Oral Daily  . pantoprazole  40 mg Oral Daily  . polyethylene glycol  17 g Oral QHS   Continuous Infusions:   LOS: 1 day   Marylu Lund, MD [facility] Pager [phone]  If 7PM-7AM, please contact night-coverage [email or web address] Password [facility] 11/10/2017, 6:47 PM

## 2017-11-10 NOTE — Progress Notes (Signed)
[facility] Progress Note   Subjective: Seen on HD. No C/Os.   Objective Vitals:   11/10/17 0953 11/10/17 1015 11/10/17 1028 11/10/17 1033  BP: (!) 188/83 (!) 199/76 (!) 186/82 (!) 199/70  Pulse: 71 (!) 55 (!) 56 78  Resp: 18 15 13 12   Temp: 97.8 F (36.6 C) 98.1 F (36.7 C)    TempSrc: Oral Oral    SpO2: 98% 98%    Weight:  70.5 kg (155 lb 6.8 oz)    Height:       Physical Exam General: Elderly female in NAD Heart: S1,S2, RRR Lungs: CTAB A/P Abdomen: Active BS Extremities: R TMA, no LE edema Dialysis Access: R fem TDC blood lines connected   Additional Objective Labs: Basic Metabolic Panel: Recent Labs  Lab 11/09/17 1832 11/09/17 1852 11/10/17 0753  NA 136 137 138  K 6.1* 6.0* 6.2*  CL 101 107 102  CO2 18*  --  19*  GLUCOSE 75 73 107*  BUN 142* 108* 144*  CREATININE 11.60* 12.50* 11.60*  CALCIUM 8.5*  --  8.7*  PHOS  --   --  6.2*   Liver Function Tests: Recent Labs  Lab 11/09/17 1832 11/10/17 0753  AST 10*  --   ALT 19  --   ALKPHOS 159*  --   BILITOT 0.6  --   PROT 7.0  --   ALBUMIN 3.5 3.3*   No results for input(s): LIPASE, AMYLASE in the last 168 hours. CBC: Recent Labs  Lab 11/09/17 1832 11/09/17 1852 11/10/17 0753  WBC 5.8  --  5.6  HGB 13.4 15.0 13.6  HCT 41.2 44.0 42.0  MCV 88.0  --  87.5  PLT 210  --  224   Blood Culture    Component Value Date/Time   SDES WOUND R GREAT TOE 05/24/2017 1200   SPECREQUEST NONE 05/24/2017 1200   CULT ABUNDANT STAPHYLOCOCCUS AUREUS 05/24/2017 1200   REPTSTATUS 05/27/2017 FINAL 05/24/2017 1200    Cardiac Enzymes: No results for input(s): CKTOTAL, CKMB, CKMBINDEX, TROPONINI in the last 168 hours. CBG: Recent Labs  Lab 11/09/17 2259 11/10/17 0745  GLUCAP 71 97   Iron Studies: No results for input(s): IRON, TIBC, TRANSFERRIN, FERRITIN in the last 72 hours. @lablastinr3 @ Studies/Results: Dg Chest Port 1 View  Result Date: 11/09/2017 CLINICAL DATA:  Fatigue and weakness,  history of missed dialysis treatments EXAM: PORTABLE CHEST 1 VIEW COMPARISON:  03/26/2017 FINDINGS: Cardiac shadow is mildly enlarged. Aortic calcifications are again seen and stable. Femoral dialysis catheter is noted extending into the right atrium in satisfactory position. The lungs are well aerated with small right-sided pleural effusion new from the prior exam. No significant vascular congestion is noted. No bony abnormality is noted. IMPRESSION: New small right-sided pleural effusion. No other focal abnormality is noted. Electronically Signed   By: Inez Catalina M.D.   On: 11/09/2017 18:28   Medications:  . amLODipine  10 mg Oral Daily  . aspirin EC  81 mg Oral Daily  . atorvastatin  40 mg Oral Daily  . calcium acetate  2,001 mg Oral TID WC  . carvedilol  3.125 mg Oral BID WC  . citalopram  10 mg Oral Daily  . feeding supplement (NEPRO CARB STEADY)  237 mL Oral Q lunch  . feeding supplement (PRO-STAT SUGAR FREE 64)  30 mL Oral BID  . gabapentin  100 mg Oral QHS  . heparin injection (subcutaneous)  5,000 Units Subcutaneous Q8H  . insulin aspart  0-5 Units  Subcutaneous QHS  . insulin aspart  0-9 Units Subcutaneous TID WC  . multivitamin  1 tablet Oral Daily  . pantoprazole  40 mg Oral Daily  . polyethylene glycol  17 g Oral QHS   HD orders: [place] T, Th, S 4 hrs 180 NRe 400/800 65.5 kg 2.0 K/ 2.0 Ca Linear Na UF Profile 4 -Heparin 5000 units IV TIW  -Hectorol 2 mcg IV TIW   Assessment/Plan: 1. Non-compliance to HD: Mild uremia/significant azotemia. HD today on schedule and short tx tomorrow to fall into OP Holiday schedule  2. Hyperkalemia: K+ 6.2 using 2.0 K bath.  2. ESRD -T,Th, S R Fem TDC. HD today and short tx tomorrow for holiday HD schedule.   3. Anemia - HGB 13.6 No ESA needed.  4. Secondary hyperparathyroidism - Ca 8.7 Add renal function panel to labs. Cont. VDRA/binders.  5. HTN/volume - On HD.  BP elevated at start of tx, coming down now. Pre wt 70.5 kg UFG 2.5.  6.  Nutrition - Albumin 3.3. Renal diet, renal vit,nepro 7. DM: per primary  Rita H. Brown NP-C 11/10/2017, 11:00 AM  [facility] [phone]  Pt seen, examined and agree w A/P as above.  Kelly Splinter MD [employer] pager [phone]   11/10/2017, 4:36 PM

## 2017-11-11 DIAGNOSIS — N186 End stage renal disease: Secondary | ICD-10-CM | POA: Diagnosis not present

## 2017-11-11 DIAGNOSIS — R0602 Shortness of breath: Secondary | ICD-10-CM

## 2017-11-11 DIAGNOSIS — E1122 Type 2 diabetes mellitus with diabetic chronic kidney disease: Secondary | ICD-10-CM | POA: Diagnosis not present

## 2017-11-11 DIAGNOSIS — Z992 Dependence on renal dialysis: Secondary | ICD-10-CM | POA: Diagnosis not present

## 2017-11-11 LAB — RENAL FUNCTION PANEL
ALBUMIN: 3.2 g/dL — AB (ref 3.5–5.0)
Anion gap: 13 (ref 5–15)
BUN: 80 mg/dL — AB (ref 6–20)
CHLORIDE: 98 mmol/L — AB (ref 101–111)
CO2: 23 mmol/L (ref 22–32)
Calcium: 9 mg/dL (ref 8.9–10.3)
Creatinine, Ser: 8.9 mg/dL — ABNORMAL HIGH (ref 0.44–1.00)
GFR calc Af Amer: 5 mL/min — ABNORMAL LOW (ref >60)
GFR calc non Af Amer: 4 mL/min — ABNORMAL LOW (ref >60)
GLUCOSE: 182 mg/dL — AB (ref 65–99)
POTASSIUM: 5.6 mmol/L — AB (ref 3.5–5.1)
Phosphorus: 5.9 mg/dL — ABNORMAL HIGH (ref 2.5–4.6)
Sodium: 134 mmol/L — ABNORMAL LOW (ref 135–145)

## 2017-11-11 LAB — CBC
HEMATOCRIT: 40.1 % (ref 36.0–46.0)
Hemoglobin: 13.3 g/dL (ref 12.0–15.0)
MCH: 29 pg (ref 26.0–34.0)
MCHC: 33.2 g/dL (ref 30.0–36.0)
MCV: 87.6 fL (ref 78.0–100.0)
Platelets: 197 10*3/uL (ref 150–400)
RBC: 4.58 MIL/uL (ref 3.87–5.11)
RDW: 18.2 % — AB (ref 11.5–15.5)
WBC: 6 10*3/uL (ref 4.0–10.5)

## 2017-11-11 LAB — GLUCOSE, CAPILLARY
GLUCOSE-CAPILLARY: 50 mg/dL — AB (ref 65–99)
GLUCOSE-CAPILLARY: 183 mg/dL — AB (ref 65–99)
Glucose-Capillary: 147 mg/dL — ABNORMAL HIGH (ref 65–99)
Glucose-Capillary: 151 mg/dL — ABNORMAL HIGH (ref 65–99)
Glucose-Capillary: 140 mg/dL — ABNORMAL HIGH (ref 65–99)
Glucose-Capillary: 131 mg/dL — ABNORMAL HIGH (ref 65–99)

## 2017-11-11 MED ORDER — SODIUM POLYSTYRENE SULFONATE PO POWD
30.0000 g | Freq: Once | ORAL | Status: AC
  Administered 2017-11-11: 30 g via ORAL
  Filled 2017-11-11: qty 30

## 2017-11-11 MED ORDER — DEXTROSE 50 % IV SOLN
INTRAVENOUS | Status: AC
  Filled 2017-11-11: qty 50

## 2017-11-11 MED ORDER — DOXERCALCIFEROL 4 MCG/2ML IV SOLN
2.0000 ug | INTRAVENOUS | Status: DC
  Administered 2017-11-12 – 2017-11-14 (×2): 2 ug via INTRAVENOUS
  Filled 2017-11-11 (×2): qty 2

## 2017-11-11 MED ORDER — SODIUM POLYSTYRENE SULFONATE 15 GM/60ML PO SUSP
30.0000 g | Freq: Once | ORAL | Status: DC
  Filled 2017-11-11: qty 120

## 2017-11-11 NOTE — Progress Notes (Signed)
PROGRESS NOTE    Fannye Myer  OAC:[id number] DOB: 03/31/1946 DOA: 11/09/2017 PCP: [name], MD    Brief Narrative:  71 y.o. female with medical history significant of ESRD-on HD TTS, non compliance, hypertension, hyperlipidemia, diabetes mellitus, stroke, GERD, depression with anxiety, atrial fibrillation not on anticoagulants, OSA not on CPAP, PVD, critical lower limb ischemia, dCHF, s/p of L AKA, who presents with generalized weakness. Hyperkalemia on presentation K+ 6.0. BP 236/132.  This morning hyperkalemia K+ 6.2. Fraser Din reports she does not feel like having dialysis today. Has no other complaints. Denies dyspnea. States she does not feel weak.  Assessment & Plan:   Principal Problem:   Hyperkalemia Active Problems:   Type II diabetes mellitus with neurological manifestations (HCC)   ESRD on dialysis (HCC)   Paroxysmal atrial fibrillation (HCC)   S/P AKA (above knee amputation) unilateral, left (HCC)   TIA (transient ischemic attack)   HLD (hyperlipidemia)   Hypertensive urgency   Hypertension  Hyperkalemia: At time of admission, K+6.0 without EKG change. Due to missing HD.  -Nephrology following -K+6.2 today -kayexalate 30 g once -Pt underwent HD on 12/30 with plan for another HD session 12/31 -BMP am  ESRD (end stage renal disease) on dialysis (TTS): missed 4 sessions of HD. Pt presented azotemic and mildly uremic. -Nephrology consulted with HD per above  Type II diabetes mellitus with neurological manifestations [facility]): Last A1c 7.6, not well controled. Patient is taking novolog at home -continue with SSI coverage -Glucose stable  Atrial Fibrillation: CHA2DS2-VASc Score is 8, needs oral anticoagulation. Patient was on Eliquis before which is discontinued recently. Unclear why anticoagulation had been discontinued. Currently rate controlled -continue coreg as tolerated  TIA and stroke -continue ASA and lipitor as tolerated   HLD  (hyperlipidemia) -continue lipitor as tolerated  Hypertensive urgency: Presenting blood pressure noted to be poorly controlled, which improved with IV hydralazine. -will continue IV hydralazine when necessary -Continue Coreg -amlodipine 10 mg daily was added at time of admit   DVT prophylaxis: Heparin subQ Code Status: DNR Family Communication: family not at bedside Disposition Plan: will stay another midnight to continue current management.  Consultants:   nephrology  Procedures:   none  Antimicrobials: Anti-infectives (From admission, onward)   None      Objective: Vitals:   11/10/17 1716 11/10/17 2052 11/11/17 0523 11/11/17 0754  BP: (!) 142/64 138/65 135/79 (!) 157/83  Pulse: 84 80 80 82  Resp: 18 18 18 18   Temp: 98 F (36.7 C) 98.1 F (36.7 C) 98.2 F (36.8 C) 98.2 F (36.8 C)  TempSrc: Oral Oral Oral Oral  SpO2: 100% 98% 96% 99%  Weight:  68.5 kg (151 lb 0.2 oz)    Height:        Intake/Output Summary (Last 24 hours) at 11/11/2017 0804 Last data filed at 11/11/2017 0524 Gross per 24 hour  Intake 360 ml  Output 2450 ml  Net -2090 ml   Filed Weights   11/10/17 1015 11/10/17 1328 11/10/17 2052  Weight: 70.5 kg (155 lb 6.8 oz) 68.5 kg (151 lb 0.2 oz) 68.5 kg (151 lb 0.2 oz)    Examination:  General exam: Appears calm and comfortable  Respiratory system: Clear to auscultation. Respiratory effort normal. Cardiovascular system: S1 & S2 heard, RRR Gastrointestinal system: Abdomen is nondistended, soft and nontender. No organomegaly or masses felt. Normal bowel sounds heard. Central nervous system: Alert and oriented. No focal neurological deficits. Extremities: left AKA Skin: No rashes, lesions Psychiatry: Judgement and insight  appear normal. Mood & affect appropriate.   Data Reviewed: I have personally reviewed following labs and imaging studies  CBC: Recent Labs  Lab 11/09/17 1832 11/09/17 1852 11/10/17 0753  WBC 5.8  --  5.6  HGB 13.4  15.0 13.6  HCT 41.2 44.0 42.0  MCV 88.0  --  87.5  PLT 210  --  016   Basic Metabolic Panel: Recent Labs  Lab 11/09/17 1832 11/09/17 1852 11/10/17 0753  NA 136 137 138  K 6.1* 6.0* 6.2*  CL 101 107 102  CO2 18*  --  19*  GLUCOSE 75 73 107*  BUN 142* 108* 144*  CREATININE 11.60* 12.50* 11.60*  CALCIUM 8.5*  --  8.7*  PHOS  --   --  6.2*   GFR: Estimated Creatinine Clearance: 4.3 mL/min (A) (by C-G formula based on SCr of 11.6 mg/dL (H)). Liver Function Tests: Recent Labs  Lab 11/09/17 1832 11/10/17 0753  AST 10*  --   ALT 19  --   ALKPHOS 159*  --   BILITOT 0.6  --   PROT 7.0  --   ALBUMIN 3.5 3.3*   No results for input(s): LIPASE, AMYLASE in the last 168 hours. No results for input(s): AMMONIA in the last 168 hours. Coagulation Profile: No results for input(s): INR, PROTIME in the last 168 hours. Cardiac Enzymes: No results for input(s): CKTOTAL, CKMB, CKMBINDEX, TROPONINI in the last 168 hours. BNP (last 3 results) No results for input(s): PROBNP in the last 8760 hours. HbA1C: No results for input(s): HGBA1C in the last 72 hours. CBG: Recent Labs  Lab 11/10/17 0745 11/10/17 1458 11/10/17 1629 11/10/17 2051 11/11/17 0752  GLUCAP 97 114* 142* 93 50*   Lipid Profile: No results for input(s): CHOL, HDL, LDLCALC, TRIG, CHOLHDL, LDLDIRECT in the last 72 hours. Thyroid Function Tests: No results for input(s): TSH, T4TOTAL, FREET4, T3FREE, THYROIDAB in the last 72 hours. Anemia Panel: No results for input(s): VITAMINB12, FOLATE, FERRITIN, TIBC, IRON, RETICCTPCT in the last 72 hours. Sepsis Labs: No results for input(s): PROCALCITON, LATICACIDVEN in the last 168 hours.  No results found for this or any previous visit (from the past 240 hour(s)).   Radiology Studies: Dg Chest Port 1 View  Result Date: 11/09/2017 CLINICAL DATA:  Fatigue and weakness, history of missed dialysis treatments EXAM: PORTABLE CHEST 1 VIEW COMPARISON:  03/26/2017 FINDINGS: Cardiac  shadow is mildly enlarged. Aortic calcifications are again seen and stable. Femoral dialysis catheter is noted extending into the right atrium in satisfactory position. The lungs are well aerated with small right-sided pleural effusion new from the prior exam. No significant vascular congestion is noted. No bony abnormality is noted. IMPRESSION: New small right-sided pleural effusion. No other focal abnormality is noted. Electronically Signed   By: Inez Catalina M.D.   On: 11/09/2017 18:28    Scheduled Meds: . amLODipine  10 mg Oral Daily  . aspirin EC  81 mg Oral Daily  . atorvastatin  40 mg Oral Daily  . calcium acetate  2,001 mg Oral TID WC  . carvedilol  3.125 mg Oral BID WC  . citalopram  10 mg Oral Daily  . feeding supplement (NEPRO CARB STEADY)  237 mL Oral Q lunch  . feeding supplement (PRO-STAT SUGAR FREE 64)  30 mL Oral BID  . gabapentin  100 mg Oral QHS  . heparin injection (subcutaneous)  5,000 Units Subcutaneous Q8H  . insulin aspart  0-5 Units Subcutaneous QHS  . insulin aspart  0-9  Units Subcutaneous TID WC  . multivitamin  1 tablet Oral Daily  . pantoprazole  40 mg Oral Daily  . polyethylene glycol  17 g Oral QHS   Continuous Infusions:   LOS: 2 days   Kayleen Memos, MD [facility] Pager [phone]  If 7PM-7AM, please contact night-coverage [email or web address] Password [email or web address] 11/11/2017, 8:04 AM

## 2017-11-11 NOTE — Progress Notes (Addendum)
[facility] Progress Note   Subjective:  Seen in room. Denies CP or dyspnea. Discussed dialysis today, and she grimaced but did not refuse. Tried to engage in conversation about whether she wanted to continue HD or not, but she did not say either way.  Objective Vitals:   11/10/17 1716 11/10/17 2052 11/11/17 0523 11/11/17 0754  BP: (!) 142/64 138/65 135/79 (!) 157/83  Pulse: 84 80 80 82  Resp: 18 18 18 18   Temp: 98 F (36.7 C) 98.1 F (36.7 C) 98.2 F (36.8 C) 98.2 F (36.8 C)  TempSrc: Oral Oral Oral Oral  SpO2: 100% 98% 96% 99%  Weight:  68.5 kg (151 lb 0.2 oz)    Height:       Physical Exam General: Well appearing, elderly female, NAD. Heart: RRR; 2/6 systolic murmur Lungs: CTAB Extremities: L AKA, R TMA without LE edema Dialysis Access: R femoral TDC, no erythema/tenderness  Additional Objective Labs: Basic Metabolic Panel: Recent Labs  Lab 11/09/17 1832 11/09/17 1852 11/10/17 0753  NA 136 137 138  K 6.1* 6.0* 6.2*  CL 101 107 102  CO2 18*  --  19*  GLUCOSE 75 73 107*  BUN 142* 108* 144*  CREATININE 11.60* 12.50* 11.60*  CALCIUM 8.5*  --  8.7*  PHOS  --   --  6.2*   Liver Function Tests: Recent Labs  Lab 11/09/17 1832 11/10/17 0753  AST 10*  --   ALT 19  --   ALKPHOS 159*  --   BILITOT 0.6  --   PROT 7.0  --   ALBUMIN 3.5 3.3*   CBC: Recent Labs  Lab 11/09/17 1832 11/09/17 1852 11/10/17 0753  WBC 5.8  --  5.6  HGB 13.4 15.0 13.6  HCT 41.2 44.0 42.0  MCV 88.0  --  87.5  PLT 210  --  224   CBG: Recent Labs  Lab 11/10/17 1458 11/10/17 1629 11/10/17 2051 11/11/17 0752 11/11/17 0841  GLUCAP 114* 142* 93 50* 147*   Studies/Results: Dg Chest Port 1 View  Result Date: 11/09/2017 CLINICAL DATA:  Fatigue and weakness, history of missed dialysis treatments EXAM: PORTABLE CHEST 1 VIEW COMPARISON:  03/26/2017 FINDINGS: Cardiac shadow is mildly enlarged. Aortic calcifications are again seen and stable. Femoral dialysis  catheter is noted extending into the right atrium in satisfactory position. The lungs are well aerated with small right-sided pleural effusion new from the prior exam. No significant vascular congestion is noted. No bony abnormality is noted. IMPRESSION: New small right-sided pleural effusion. No other focal abnormality is noted. Electronically Signed   By: Inez Catalina M.D.   On: 11/09/2017 18:28   Medications:  . amLODipine  10 mg Oral Daily  . aspirin EC  81 mg Oral Daily  . atorvastatin  40 mg Oral Daily  . calcium acetate  2,001 mg Oral TID WC  . carvedilol  3.125 mg Oral BID WC  . citalopram  10 mg Oral Daily  . feeding supplement (NEPRO CARB STEADY)  237 mL Oral Q lunch  . feeding supplement (PRO-STAT SUGAR FREE 64)  30 mL Oral BID  . gabapentin  100 mg Oral QHS  . heparin injection (subcutaneous)  5,000 Units Subcutaneous Q8H  . insulin aspart  0-5 Units Subcutaneous QHS  . insulin aspart  0-9 Units Subcutaneous TID WC  . multivitamin  1 tablet Oral Daily  . pantoprazole  40 mg Oral Daily  . polyethylene glycol  17 g Oral QHS  Dialysis Orders: TTS at [place], BFR 400, DFR 800, EDW 65.5kg, 2K/2Ca, Linear Na, Prof 4, heparin 5K bolus - Hectoral 14mcg IV q HD  Assessment/Plan: 1. Non-compliance with HD: Mild uremia/significant azotemia on admit. S/p HD 12/30 and back again per holiday schedule. Passive in her agreement to continue HD, need further discussions to make sure this is what she wants. Had missed 4 HD prior to admit. 2. Hyperkalemia: K 6.2 on admit, s/p HD 12/30. 3. ESRD: Usually TTS schedule. For short-HD as prev discussed, then back to outpatient TTS schedule.  4. HTN/volume: BP better, will try to get to EDW. 5. Anemia: Hgb 13.6. No ESA for now. 6. Secondary hyperparathyroidism: Corr Ca ok, Phos high. Continue binders/VDRA. 7. Nutrition: Alb low, continue supplements. 8. DM: On insulin, per primary.  Veneta Penton, PA-C 11/11/2017, 9:21 AM   [facility] Pager: [phone]  Patient was seen on dialysis and the procedure was supervised. BFR 270 Via left fem HD cath BP is 125/76.[name]  Patient appears to be tolerating treatment well although did have an episode where she became startled and sat up during HD.  Donetta Potts, MD [facility]

## 2017-11-12 ENCOUNTER — Inpatient Hospital Stay (HOSPITAL_COMMUNITY): Payer: Medicare Other

## 2017-11-12 LAB — GLUCOSE, CAPILLARY
GLUCOSE-CAPILLARY: 114 mg/dL — AB (ref 65–99)
GLUCOSE-CAPILLARY: 140 mg/dL — AB (ref 65–99)
GLUCOSE-CAPILLARY: 160 mg/dL — AB (ref 65–99)
GLUCOSE-CAPILLARY: 65 mg/dL (ref 65–99)
GLUCOSE-CAPILLARY: 83 mg/dL (ref 65–99)

## 2017-11-12 LAB — BASIC METABOLIC PANEL
ANION GAP: 18 — AB (ref 5–15)
BUN: 21 mg/dL — ABNORMAL HIGH (ref 6–20)
CHLORIDE: 92 mmol/L — AB (ref 101–111)
CO2: 25 mmol/L (ref 22–32)
Calcium: 9.2 mg/dL (ref 8.9–10.3)
Creatinine, Ser: 3.93 mg/dL — ABNORMAL HIGH (ref 0.44–1.00)
GFR calc Af Amer: 12 mL/min — ABNORMAL LOW (ref >60)
GFR, EST NON AFRICAN AMERICAN: 10 mL/min — AB (ref >60)
Glucose, Bld: 167 mg/dL — ABNORMAL HIGH (ref 65–99)
POTASSIUM: 3.7 mmol/L (ref 3.5–5.1)
SODIUM: 135 mmol/L (ref 135–145)

## 2017-11-12 LAB — POTASSIUM: Potassium: 4.5 mmol/L (ref 3.5–5.1)

## 2017-11-12 MED ORDER — HYDRALAZINE HCL 20 MG/ML IJ SOLN
10.0000 mg | Freq: Once | INTRAMUSCULAR | Status: AC
  Administered 2017-11-12: 10 mg via INTRAVENOUS

## 2017-11-12 MED ORDER — LORAZEPAM 2 MG/ML IJ SOLN: 0.5000 mg | Freq: Four times a day (QID) | INTRAMUSCULAR | Status: DC | PRN

## 2017-11-12 MED ORDER — DEXTROSE 50 % IV SOLN
INTRAVENOUS | Status: AC
  Administered 2017-11-12: 25 mL
  Filled 2017-11-12: qty 50

## 2017-11-12 MED ORDER — DOXERCALCIFEROL 4 MCG/2ML IV SOLN
INTRAVENOUS | Status: AC
  Administered 2017-11-12: 2 ug via INTRAVENOUS
  Filled 2017-11-12: qty 2

## 2017-11-12 MED ORDER — ONDANSETRON HCL 4 MG/2ML IJ SOLN
INTRAMUSCULAR | Status: AC
  Administered 2017-11-12: 4 mg via INTRAVENOUS
  Filled 2017-11-12: qty 2

## 2017-11-12 MED ORDER — ONDANSETRON HCL 4 MG/2ML IJ SOLN
4.0000 mg | Freq: Once | INTRAMUSCULAR | Status: AC
  Administered 2017-11-12: 4 mg via INTRAVENOUS

## 2017-11-12 MED ORDER — HYDRALAZINE HCL 10 MG PO TABS
10.0000 mg | ORAL_TABLET | Freq: Three times a day (TID) | ORAL | Status: DC
  Administered 2017-11-12 – 2017-11-15 (×7): 10 mg via ORAL
  Filled 2017-11-12 (×8): qty 1

## 2017-11-12 MED ORDER — PROMETHAZINE HCL 25 MG/ML IJ SOLN
12.5000 mg | Freq: Once | INTRAMUSCULAR | Status: AC
  Administered 2017-11-12: 12.5 mg via INTRAVENOUS
  Filled 2017-11-12: qty 1

## 2017-11-12 MED ORDER — HYDRALAZINE HCL 20 MG/ML IJ SOLN
INTRAMUSCULAR | Status: AC
  Administered 2017-11-12: 10 mg via INTRAVENOUS
  Filled 2017-11-12: qty 1

## 2017-11-12 NOTE — Progress Notes (Addendum)
HD RN reports seizure like activity with tonic clonic movements at the dialysis center this afternoon around 1600 lasting about 5 mins and resolving spontaneously. BP 217/119. Other vital signs normal. Not hypoglycemic. Rapid response team called. Stat hydralazine 10 mg IV, stat CT head no contrast, stat EKG, stat EEG and stat BMP ordered to check electrolytes. Continue close monitoring and transfer patient to higher level of care stepdown unit. Neurology paged twice. Awaiting call back. IV ativan prn for seizure activities.   Remote history of seizures with no recent treatment. Hx of non compliance with medications and medical management.

## 2017-11-12 NOTE — Progress Notes (Signed)
[facility] Progress Note   Subjective: Awake, alert, appears cheerful. Says she never wanted to stop HD, she wants to move back to [place]. Says she is expecting to go to HD wherever she lives. Denies SOB.   Objective Vitals:   11/11/17 1544 11/11/17 2035 11/12/17 0557 11/12/17 0800  BP: (!) 144/81 (!) 192/92 (!) 185/87 (!) 179/82  Pulse: 88 78 77 76  Resp: 17 18 18 18   Temp: 98.2 F (36.8 C) 98.6 F (37 C) 97.7 F (36.5 C) 98.4 F (36.9 C)  TempSrc: Oral Oral Oral Oral  SpO2: 96% 100% 100% 100%  Weight: 68.8 kg (151 lb 10.8 oz) 68.8 kg (151 lb 10.8 oz)    Height:       Physical Exam General: Elderly female in NAD. More engaged today.  Heart: S1,S2 RRR No M/R/G Lungs: CTAB A/P Abdomen: Active BS NDNT Extremities: R TMA No LE edema Dialysis Access: R Fem TDC Drsg CDI.     Additional Objective Labs: Basic Metabolic Panel: Recent Labs  Lab 11/09/17 1832 11/09/17 1852 11/10/17 0753 11/11/17 1502  NA 136 137 138 134*  K 6.1* 6.0* 6.2* 5.6*  CL 101 107 102 98*  CO2 18*  --  19* 23  GLUCOSE 75 73 107* 182*  BUN 142* 108* 144* 80*  CREATININE 11.60* 12.50* 11.60* 8.90*  CALCIUM 8.5*  --  8.7* 9.0  PHOS  --   --  6.2* 5.9*   Liver Function Tests: Recent Labs  Lab 11/09/17 1832 11/10/17 0753 11/11/17 1502  AST 10*  --   --   ALT 19  --   --   ALKPHOS 159*  --   --   BILITOT 0.6  --   --   PROT 7.0  --   --   ALBUMIN 3.5 3.3* 3.2*   No results for input(s): LIPASE, AMYLASE in the last 168 hours. CBC: Recent Labs  Lab 11/09/17 1832 11/09/17 1852 11/10/17 0753 11/11/17 1502  WBC 5.8  --  5.6 6.0  HGB 13.4 15.0 13.6 13.3  HCT 41.2 44.0 42.0 40.1  MCV 88.0  --  87.5 87.6  PLT 210  --  224 197   Blood Culture    Component Value Date/Time   SDES WOUND R GREAT TOE 05/24/2017 1200   SPECREQUEST NONE 05/24/2017 1200   CULT ABUNDANT STAPHYLOCOCCUS AUREUS 05/24/2017 1200   REPTSTATUS 05/27/2017 FINAL 05/24/2017 1200    Cardiac  Enzymes: No results for input(s): CKTOTAL, CKMB, CKMBINDEX, TROPONINI in the last 168 hours. CBG: Recent Labs  Lab 11/11/17 1209 11/11/17 1435 11/11/17 1740 11/11/17 2034 11/12/17 0737  GLUCAP 183* 151* 140* 131* 114*   Iron Studies: No results for input(s): IRON, TIBC, TRANSFERRIN, FERRITIN in the last 72 hours. @lablastinr3 @ Studies/Results: No results found. Medications:  . amLODipine  10 mg Oral Daily  . aspirin EC  81 mg Oral Daily  . atorvastatin  40 mg Oral Daily  . calcium acetate  2,001 mg Oral TID WC  . carvedilol  3.125 mg Oral BID WC  . citalopram  10 mg Oral Daily  . doxercalciferol  2 mcg Intravenous Q T,Th,Sa-HD  . feeding supplement (NEPRO CARB STEADY)  237 mL Oral Q lunch  . feeding supplement (PRO-STAT SUGAR FREE 64)  30 mL Oral BID  . gabapentin  100 mg Oral QHS  . heparin injection (subcutaneous)  5,000 Units Subcutaneous Q8H  . insulin aspart  0-5 Units Subcutaneous QHS  . insulin aspart  0-9  Units Subcutaneous TID WC  . multivitamin  1 tablet Oral Daily  . pantoprazole  40 mg Oral Daily  . polyethylene glycol  17 g Oral QHS     Dialysis Orders: TTS at [place], BFR 400, DFR 800, EDW 65.5kg, 2K/2Ca, Linear Na, Prof 4, heparin 5K bolus - Hectoral 87mcg IV q HD  Assessment/Plan: 1. Non-compliance with HD: Mild uremia/significant azotemia on admit. S/p HD 12/30 and back again per holiday schedule. Says she never wanted to stop HD, says she wishes to move back to [place] where she originally lived. Feels like she has no support here. Had missed 4 HD prior to admit. 2. Hyperkalemia: K 6.2 on admit, s/p HD 12/30. K+ 5.6 prior to HD yesterday. Recheck renal function panel today.  3. ESRD: Usually TTS schedule.Plan was to have HD on OP holiday schedule however no volume removed on patient yesterday. Will have HD today on schedule today.  4. HTN/volume: HD yesterday pre wt 68.8 Net UF +151 Post wt 68.8 kg. Was not getting to EDW at OP center.  Wts here appear similar to those at center. May need to raise EDW. Attempt 1-1.5 liters today. Scr still elevated at 8.9 (usually in 5-6 range) yesterday. Check labs today.  5. Anemia: Hgb 13.6. No ESA for now. 6. Secondary hyperparathyroidism: Corr Ca ok, Phos high. Continue binders/VDRA. 7. Nutrition: Alb low, continue supplements. 8. DM: On insulin, per primary. 9. DIsposition- poor prognosis with FTT.  Recommend Palliative Care consult to help set goals/limits of care.   Rita H. Brown NP-C 11/12/2017, 9:01 AM  [employer] [phone]  I have seen and examined this patient and agree with plan and assessment in the above note with renal recommendations/intervention highlighted.  Seen on dialysis and feels "like I'm dying".  Lethargic but arousable however RN reports periods of staring blankly with normal vitals.  May need to check EEG to r/o Absence seizures. Broadus John A Aerielle Stoklosa,MD 11/12/2017 2:27 PM

## 2017-11-12 NOTE — Progress Notes (Signed)
Pt was about to be transported back to her room after HD when she started seizing; placed pt on he side and put her on O2 and checked her BP which had gone from SPB of low 100's to SBP 200. Rapid response was paged; pt unresponsive and biting down her tongue. Upon arrival of [facility], rapid response RN continued to try and arouse the pt; asessed and advised to call Dr. Irene Pap attending MD; she ordered CT of head without contrast; EEG, transfer to step down unit and hydralazine 10mg  IV  Once for the hypertension. Accompanied pt to CT with the rapid response RN, CT clear and returned to HD.  Pt starting to to awake and asking why her tongue is hurting; tongue noted swollen form biting self. Pt was then transferred to [facility].

## 2017-11-12 NOTE — Progress Notes (Signed)
PROGRESS NOTE    Deborah Hendricks  BSW:[id number] DOB: 1946-10-21 DOA: 11/09/2017 PCP: [name], MD    Brief Narrative:  72 y.o. female with medical history significant of ESRD-on HD TTS, non compliance, hypertension, hyperlipidemia, diabetes mellitus, stroke, GERD, depression with anxiety, atrial fibrillation not on anticoagulants, OSA not on CPAP, PVD, critical lower limb ischemia, dCHF, s/p of L AKA, who presents with generalized weakness. Hyperkalemia on presentation K+ 6.0. BP 236/132.  This morning hyperkalemia K+ 5.6 from 6.2. Patient is agreeable to go to dialysis today. Patient seen and examined at dialysis. She is somnolent but arousable and responds to questions appropriately. Denies any pain or dyspnea.  Assessment & Plan:   Principal Problem:   Hyperkalemia Active Problems:   Type II diabetes mellitus with neurological manifestations (HCC)   ESRD on dialysis (HCC)   Paroxysmal atrial fibrillation (HCC)   S/P AKA (above knee amputation) unilateral, left (HCC)   TIA (transient ischemic attack)   HLD (hyperlipidemia)   Hypertensive urgency   Hypertension  Hyperkalemia: At time of admission, K+6.0 without EKG change. Due to missing HD.  -Nephrology following -K+ 5.6 from 6.2  -kayexalate 30 g once 11/11/17 -Pt underwent HD on 12/30 with plan for another HD session 12/31 -HD today 11/12/17 -BMP am  ESRD (end stage renal disease) on dialysis (TTS): missed 4 sessions of HD. Pt presented azotemic and mildly uremic. -Nephrology consulted with HD per above  Type II diabetes mellitus with neurological manifestations [facility]): Last A1c 7.6, not well controled. Patient is taking novolog at home -continue with SSI coverage -Glucose stable  Atrial Fibrillation: CHA2DS2-VASc Score is 8, needs oral anticoagulation. Patient was on Eliquis before which is discontinued recently. Unclear why anticoagulation had been discontinued. Currently rate controlled -continue coreg as  tolerated  TIA and stroke -continue ASA and lipitor as tolerated   HLD (hyperlipidemia) -continue lipitor as tolerated  Hypertensive urgency: Presenting blood pressure noted to be poorly controlled, which improved with IV hydralazine. -will continue IV hydralazine when necessary -Continue Coreg -amlodipine 10 mg daily was added at time of admit   DVT prophylaxis: Heparin subQ Code Status: DNR Family Communication: family not at bedside Disposition Plan: will stay another midnight to continue current management.  Consultants:   nephrology  Procedures:   none  Antimicrobials: Anti-infectives (From admission, onward)   None      Objective: Vitals:   11/12/17 1130 11/12/17 1200 11/12/17 1230 11/12/17 1300  BP: 103/65 (!) 89/56 (!) 102/58 (!) 92/53  Pulse: 83 87 85 85  Resp: 19 19 17 19   Temp:      TempSrc:      SpO2:      Weight:      Height:        Intake/Output Summary (Last 24 hours) at 11/12/2017 1408 Last data filed at 11/12/2017 0854 Gross per 24 hour  Intake 720 ml  Output -1 ml  Net 721 ml   Filed Weights   11/11/17 1544 11/11/17 2035 11/12/17 1029  Weight: 68.8 kg (151 lb 10.8 oz) 68.8 kg (151 lb 10.8 oz) 69.3 kg (152 lb 12.5 oz)    Examination:  General exam: Appears calm and comfortable  Respiratory system: Clear to auscultation. Respiratory effort normal. Cardiovascular system: S1 & S2 heard, RRR Gastrointestinal system: Abdomen is nondistended, soft and nontender. No organomegaly or masses felt. Normal bowel sounds heard. Central nervous system: Alert and oriented. No focal neurological deficits. Extremities: left AKA Skin: No rashes, lesions Psychiatry: Judgement and insight  appear normal. Mood & affect appropriate.   Data Reviewed: I have personally reviewed following labs and imaging studies  CBC: Recent Labs  Lab 11/09/17 1832 11/09/17 1852 11/10/17 0753 11/11/17 1502  WBC 5.8  --  5.6 6.0  HGB 13.4 15.0 13.6 13.3  HCT 41.2  44.0 42.0 40.1  MCV 88.0  --  87.5 87.6  PLT 210  --  224 782   Basic Metabolic Panel: Recent Labs  Lab 11/09/17 1832 11/09/17 1852 11/10/17 0753 11/11/17 1502 11/12/17 1000  NA 136 137 138 134*  --   K 6.1* 6.0* 6.2* 5.6* 4.5  CL 101 107 102 98*  --   CO2 18*  --  19* 23  --   GLUCOSE 75 73 107* 182*  --   BUN 142* 108* 144* 80*  --   CREATININE 11.60* 12.50* 11.60* 8.90*  --   CALCIUM 8.5*  --  8.7* 9.0  --   PHOS  --   --  6.2* 5.9*  --    GFR: Estimated Creatinine Clearance: 5.6 mL/min (A) (by C-G formula based on SCr of 8.9 mg/dL (H)). Liver Function Tests: Recent Labs  Lab 11/09/17 1832 11/10/17 0753 11/11/17 1502  AST 10*  --   --   ALT 19  --   --   ALKPHOS 159*  --   --   BILITOT 0.6  --   --   PROT 7.0  --   --   ALBUMIN 3.5 3.3* 3.2*   No results for input(s): LIPASE, AMYLASE in the last 168 hours. No results for input(s): AMMONIA in the last 168 hours. Coagulation Profile: No results for input(s): INR, PROTIME in the last 168 hours. Cardiac Enzymes: No results for input(s): CKTOTAL, CKMB, CKMBINDEX, TROPONINI in the last 168 hours. BNP (last 3 results) No results for input(s): PROBNP in the last 8760 hours. HbA1C: No results for input(s): HGBA1C in the last 72 hours. CBG: Recent Labs  Lab 11/11/17 1209 11/11/17 1435 11/11/17 1740 11/11/17 2034 11/12/17 0737  GLUCAP 183* 151* 140* 131* 114*   Lipid Profile: No results for input(s): CHOL, HDL, LDLCALC, TRIG, CHOLHDL, LDLDIRECT in the last 72 hours. Thyroid Function Tests: No results for input(s): TSH, T4TOTAL, FREET4, T3FREE, THYROIDAB in the last 72 hours. Anemia Panel: No results for input(s): VITAMINB12, FOLATE, FERRITIN, TIBC, IRON, RETICCTPCT in the last 72 hours. Sepsis Labs: No results for input(s): PROCALCITON, LATICACIDVEN in the last 168 hours.  No results found for this or any previous visit (from the past 240 hour(s)).   Radiology Studies: No results found.  Scheduled  Meds: . amLODipine  10 mg Oral Daily  . aspirin EC  81 mg Oral Daily  . atorvastatin  40 mg Oral Daily  . calcium acetate  2,001 mg Oral TID WC  . carvedilol  3.125 mg Oral BID WC  . citalopram  10 mg Oral Daily  . doxercalciferol  2 mcg Intravenous Q T,Th,Sa-HD  . feeding supplement (NEPRO CARB STEADY)  237 mL Oral Q lunch  . feeding supplement (PRO-STAT SUGAR FREE 64)  30 mL Oral BID  . gabapentin  100 mg Oral QHS  . heparin injection (subcutaneous)  5,000 Units Subcutaneous Q8H  . hydrALAZINE  10 mg Oral Q8H  . insulin aspart  0-5 Units Subcutaneous QHS  . insulin aspart  0-9 Units Subcutaneous TID WC  . multivitamin  1 tablet Oral Daily  . pantoprazole  40 mg Oral Daily  . polyethylene glycol  17 g Oral QHS   Continuous Infusions:   LOS: 3 days   Kayleen Memos, MD [facility] Pager [phone]  If 7PM-7AM, please contact night-coverage [email or web address] Password [email or web address] 11/12/2017, 2:08 PM

## 2017-11-12 NOTE — Significant Event (Signed)
Rapid Response Event Note  Overview: Time Called: 1528 Arrival Time: 5597 Event Type: Neurologic  Initial Focused Assessment: Per Staff patient with seizure activity.  Upon my arrival patient unresponsive, seems postictal.  She has bit her tongue and her lower lip. Eyes deviated to right,  Some questionable nystagmus.  Pinpoint pupils BP 217/119  SR 95  RR 20   Initially on NRB O2 mask. Lung sounds clear, decreased bases.  Interventions: Placed on RA  O2 sats 96% Mouth care done/cleaned blood with oral care kit/suction Per staff patient's teeth were clench, now I am able to fully clean her mouth.  Patient starting to awake briefly.  Able to move bilat arms.  (left  AKA) Clear speech, thick tongue  (bit tongue)  50m Hydralazine given IV BP 159/86  HR 95  12 lead EKG done Stat head CT done  Patient began vomiting while in CT:  450mZofran given IV  Patient continues to be very sleepy., but arouses to voice  Transported to 2c13 via bed.  Alert, weepy.  Keeps asking "what happened"  Plan of Care (if not transferred):  Event Summary: Name of Physician Notified: Dr HaNevada Cranet [phone] of Consulting Physician Notified: Dr CoMarval Regalt [id number]  Outcome: Transferred (Comment)  Event End Time: 17[place]

## 2017-11-13 ENCOUNTER — Inpatient Hospital Stay (HOSPITAL_COMMUNITY): Payer: Medicare Other

## 2017-11-13 ENCOUNTER — Encounter (HOSPITAL_COMMUNITY): Payer: Medicare Other

## 2017-11-13 LAB — CBC
HEMATOCRIT: 39.9 % (ref 36.0–46.0)
Hemoglobin: 12.8 g/dL (ref 12.0–15.0)
MCH: 28.7 pg (ref 26.0–34.0)
MCHC: 32.1 g/dL (ref 30.0–36.0)
MCV: 89.5 fL (ref 78.0–100.0)
PLATELETS: 150 10*3/uL (ref 150–400)
RBC: 4.46 MIL/uL (ref 3.87–5.11)
RDW: 18 % — AB (ref 11.5–15.5)
WBC: 12.9 10*3/uL — AB (ref 4.0–10.5)

## 2017-11-13 LAB — BASIC METABOLIC PANEL
Anion gap: 9 (ref 5–15)
BUN: 26 mg/dL — AB (ref 6–20)
CO2: 27 mmol/L (ref 22–32)
Calcium: 9.1 mg/dL (ref 8.9–10.3)
Chloride: 99 mmol/L — ABNORMAL LOW (ref 101–111)
Creatinine, Ser: 4.68 mg/dL — ABNORMAL HIGH (ref 0.44–1.00)
GFR calc Af Amer: 10 mL/min — ABNORMAL LOW (ref >60)
GFR, EST NON AFRICAN AMERICAN: 8 mL/min — AB (ref >60)
Glucose, Bld: 104 mg/dL — ABNORMAL HIGH (ref 65–99)
Potassium: 3.7 mmol/L (ref 3.5–5.1)
SODIUM: 135 mmol/L (ref 135–145)

## 2017-11-13 LAB — GLUCOSE, CAPILLARY
GLUCOSE-CAPILLARY: 102 mg/dL — AB (ref 65–99)
GLUCOSE-CAPILLARY: 94 mg/dL (ref 65–99)
GLUCOSE-CAPILLARY: 272 mg/dL — AB (ref 65–99)
Glucose-Capillary: 96 mg/dL (ref 65–99)
Glucose-Capillary: 178 mg/dL — ABNORMAL HIGH (ref 65–99)

## 2017-11-13 LAB — HEMOGLOBIN A1C
Hgb A1c MFr Bld: 6.7 % — ABNORMAL HIGH (ref 4.8–5.6)
Mean Plasma Glucose: 145.59 mg/dL

## 2017-11-13 MED ORDER — ASPIRIN 81 MG PO CHEW
81.0000 mg | CHEWABLE_TABLET | Freq: Every day | ORAL | Status: DC
  Administered 2017-11-13 – 2017-11-15 (×3): 81 mg via ORAL
  Filled 2017-11-13 (×3): qty 1

## 2017-11-13 MED ORDER — PANTOPRAZOLE SODIUM 40 MG PO PACK
40.0000 mg | PACK | Freq: Every day | ORAL | Status: DC
  Administered 2017-11-13: 40 mg
  Filled 2017-11-13: qty 20

## 2017-11-13 MED ORDER — SODIUM CHLORIDE 0.9 % IV SOLN
500.0000 mg | Freq: Two times a day (BID) | INTRAVENOUS | Status: DC
  Administered 2017-11-13 (×2): 500 mg via INTRAVENOUS
  Filled 2017-11-13 (×2): qty 5

## 2017-11-13 MED ORDER — SODIUM CHLORIDE 0.9 % IV SOLN
500.0000 mg | Freq: Two times a day (BID) | INTRAVENOUS | Status: AC
  Administered 2017-11-14 (×2): 500 mg via INTRAVENOUS
  Filled 2017-11-13 (×2): qty 5

## 2017-11-13 MED ORDER — GABAPENTIN 100 MG PO CAPS
100.0000 mg | ORAL_CAPSULE | Freq: Two times a day (BID) | ORAL | Status: DC
  Filled 2017-11-13: qty 1

## 2017-11-13 MED ORDER — GABAPENTIN 250 MG/5ML PO SOLN
100.0000 mg | Freq: Two times a day (BID) | ORAL | Status: DC
  Administered 2017-11-13 – 2017-11-14 (×3): 100 mg via ORAL
  Filled 2017-11-13 (×5): qty 2

## 2017-11-13 MED ORDER — ACETAMINOPHEN 325 MG PO TABS: 650.0000 mg | ORAL_TABLET | Freq: Four times a day (QID) | ORAL | Status: DC | PRN

## 2017-11-13 NOTE — Consult Note (Addendum)
NEURO HOSPITALIST CONSULT NOTE   Requestig physician: Dr. Nevada Crane   Reason for Consult: seizure   History obtained from:  Patient   Chart    HPI:                                                                                                                                          Deborah Hendricks is an 72 y.o. female with remote history of possible seizure-- back in 2016. She has been seen multiple times in [facility] and twice before by neurology but not for seizure activity. Looking in care everywhere she has been seen for multiple other problems but not for seizure. She has been on Neurontin in the past but I could not find any other AED. During dialysis she had a period of Myoclonus with tongue biting. EEG was ordered and pending. CT head obtained showed "Chronic bilateral basal ganglial lacunar infarcts, right greater than left."  Neurology was consulted for possible seizure versus dialysis dysequilibrium syndrome which can manifest into seizures post dialysis.    Called daughter who states she has been a attention seeker for a long time and she is concerned she dries wolf a lot. She stroked back in 2016 due to not taking medications. After her stroke she had a seizure but then she stopped the medication (family did) due to [place].   Of note daughter states she often has SE with many medications. She was slightly concerned  IF we were to place her on a AED to be conscious of this.   Past Medical History:  Diagnosis Date  . Anemia   . Anxiety   . CHF (congestive heart failure) ([place])   . Constipation   . Critical lower limb ischemia   . Depression   . ESRD on dialysis [facility])    "TTS; [place]" (11/27/2016)  . GERD (gastroesophageal reflux disease)   . Heart murmur   . HLD (hyperlipidemia)   . Hypertension   . Memory loss   . Peripheral vascular disease ([place])   . Refusal of blood transfusions as patient is Jehovah's Witness   . Seizures ([place]) 04/2015   s/p CVA.  [name]  Sleep apnea    Noncompliant with CPAP.  [name] Stroke ([place]) 2015  . Type II diabetes mellitus ([place])     Past Surgical History:  Procedure Laterality Date  . ABDOMINAL AORTOGRAM W/LOWER EXTREMITY Right 06/13/2017   Procedure: Abdominal Aortogram w/Lower Extremity;  Surgeon: Waynetta Sandy, MD;  Location: [place] [facility];  Service: Cardiovascular;  Laterality: Right;  . ABDOMINAL HYSTERECTOMY    . AMPUTATION Left 12/15/2015   Procedure: LEFT ABOVE KNEE AMPUTATION;  Surgeon: Serafina Mitchell, MD;  Location: [place];  Service: Vascular;  Laterality: Left;  . AMPUTATION Right 06/19/2017  Procedure: AMPUTATION right great toe;  Surgeon: Serafina Mitchell, MD;  Location: [place];  Service: Vascular;  Laterality: Right;  . BASCILIC VEIN TRANSPOSITION Right 11/25/2015   Procedure: Right Arm FIRST STAGE BASILIC VEIN TRANSPOSITION;  Surgeon: Serafina Mitchell, MD;  Location: [place];  Service: Vascular;  Laterality: Right;  . BASCILIC VEIN TRANSPOSITION Right 01/19/2016   Procedure: RIGHT SECOND STAGE BASILIC VEIN TRANSPOSITION;  Surgeon: Serafina Mitchell, MD;  Location: [place];  Service: Vascular;  Laterality: Right;  . DILATION AND CURETTAGE OF UTERUS    . EYE SURGERY    . IR FLUORO GUIDE CV LINE RIGHT  06/05/2017  . PERIPHERAL VASCULAR BALLOON ANGIOPLASTY Right 06/13/2017   Procedure: PERIPHERAL VASCULAR BALLOON ANGIOPLASTY;  Surgeon: Waynetta Sandy, MD;  Location: [place] [facility];  Service: Cardiovascular;  Laterality: Right;  POPLITEAL/SFA/PERONEAL  . PERIPHERAL VASCULAR CATHETERIZATION Bilateral 11/28/2015   Procedure: Lower Extremity Angiography;  Surgeon: Lorretta Harp, MD;  Location: [place] [facility];  Service: Cardiovascular;  Laterality: Bilateral;  . PERIPHERAL VASCULAR CATHETERIZATION N/A 11/28/2015   Procedure: Abdominal Aortogram;  Surgeon: Lorretta Harp, MD;  Location: [place] [facility];  Service: Cardiovascular;  Laterality: N/A;  . PERIPHERAL VASCULAR CATHETERIZATION Right  11/27/2016  . PERIPHERAL VASCULAR CATHETERIZATION N/A 11/27/2016   Procedure: Abdominal Aortogram w/Lower Extremity;  Surgeon: Serafina Mitchell, MD;  Location: [place] [facility];  Service: Cardiovascular;  Laterality: N/A;  . PERIPHERAL VASCULAR CATHETERIZATION N/A 11/27/2016   Procedure: Right arm and central venogram;  Surgeon: Serafina Mitchell, MD;  Location: [place] [facility];  Service: Cardiovascular;  Laterality: N/A;  . PERIPHERAL VASCULAR CATHETERIZATION Right 11/27/2016   Procedure: Peripheral Vascular Atherectomy;  Surgeon: Serafina Mitchell, MD;  Location: [place] [facility];  Service: Cardiovascular;  Laterality: Right;  Rt    sfa, popliteal, tibioperoneal trunk, peroneal  . PERIPHERAL VASCULAR CATHETERIZATION Right 11/27/2016   Procedure: Peripheral Vascular Balloon Angioplasty;  Surgeon: Serafina Mitchell, MD;  Location: [place] [facility];  Service: Cardiovascular;  Laterality: Right;  Tibial peroneal  . RETINOPATHY SURGERY Bilateral   . TOE AMPUTATION Right    2nd, 3rd, 4th, 5th digits  . TONSILLECTOMY      Family History  Problem Relation Age of Onset  . Asthma Mother   . Cancer Sister      Social History:  reports that she quit smoking about 2 years ago. Her smoking use included cigarettes. She has a 12.75 pack-year smoking history. she has never used smokeless tobacco. She reports that she drinks alcohol. She reports that she does not use drugs.  Allergies  Allergen Reactions  . Penicillins Hives, Itching, Rash and Other (See Comments)    Has patient had a PCN reaction causing immediate rash, facial/tongue/throat swelling, SOB or lightheadedness with hypotension: Yes Has patient had a PCN reaction causing severe rash involving mucus membranes or skin necrosis:unknown MAR source Has patient had a PCN reaction that required hospitalization:unknown MAR source Has patient had a PCN reaction occurring within the last 10 years:unknown MAR source If all of the above answers are  "NO", then may proceed with Cephalosporin use.  . Sulfa Antibiotics     UNSPECIFIED REACTION   . Amoxicillin Hives, Itching and Rash    Has patient had a PCN reaction causing immediate rash, facial/tongue/throat swelling, SOB or lightheadedness with hypotension:unknown MAR source Has patient had a PCN reaction causing severe rash involving mucus membranes or skin necrosis:unknown MAR source Has patient had  a PCN reaction that required hospitalization:unknown MAR source Has patient had a PCN reaction occurring within the last 10 years: unknown MAR source If all of the above answers are "NO", then may proceed with Cephalosporin use.   . Latex Rash    MEDICATIONS:                                                                                                                     Prior to Admission:  Medications Prior to Admission  Medication Sig Dispense Refill Last Dose  . Amino Acids-Protein Hydrolys (FEEDING SUPPLEMENT, PRO-STAT SUGAR FREE 64,) LIQD Take 30 mLs by mouth 2 (two) times daily. (0900 & 2100)   11/09/2017 at Unknown time  . aspirin EC 81 MG tablet Take 81 mg by mouth daily.   11/09/2017 at Unknown time  . atorvastatin (LIPITOR) 40 MG tablet Take 1 tablet (40 mg total) by mouth daily. 30 tablet 3 11/08/2017 at Unknown time  . calcium acetate (PHOSLO) 667 MG capsule Take 2,001 mg by mouth 3 (three) times daily with meals.    11/09/2017 at Unknown time  . carvedilol (COREG) 3.125 MG tablet Take 1 tablet (3.125 mg total) by mouth 2 (two) times daily with a meal. 60 tablet 1 11/09/2017 at 09:00  . citalopram (CELEXA) 10 MG tablet Take 10 mg by mouth daily. (0900)  3 11/09/2017 at Unknown time  . esomeprazole (NEXIUM) 20 MG capsule Take 1 capsule (20 mg total) by mouth daily. 90 capsule 3 11/09/2017 at Unknown time  . gabapentin (NEURONTIN) 100 MG capsule Take 1 capsule (100 mg total) by mouth at bedtime.   11/08/2017 at Unknown time  . insulin aspart (NOVOLOG) 100 UNIT/ML injection  Inject 2-10 Units into the skin 3 (three) times daily as needed for high blood sugar. 151-200=2 units, 201-250=4 units, 251-300=6 units, 301-350=8 units, 351-400=10 units, >400=10 units and Call MD   11/09/2017 at Unknown time  . multivitamin (RENA-VIT) TABS tablet Take 1 tablet by mouth daily. (0900)   11/09/2017 at Unknown time  . Nutritional Supplements (FEEDING SUPPLEMENT, NEPRO CARB STEADY,) LIQD Take 237 mLs by mouth daily with lunch.   11/09/2017 at Unknown time  . ondansetron (ZOFRAN ODT) 4 MG disintegrating tablet Take 1 tablet (4 mg total) by mouth every 8 (eight) hours as needed for nausea or vomiting. 20 tablet 0 prn  . polyethylene glycol powder (GLYCOLAX/MIRALAX) powder Take 17 g by mouth at bedtime. HOLD FOR LOOSE STOOLS    11/08/2017 at Unknown time  . psyllium (REGULOID) 0.52 g capsule One po tid prn constipation. (Patient taking differently: Take 0.52 g by mouth 3 (three) times daily as needed (for constipation.). ) 90 capsule 2 prn  . apixaban (ELIQUIS) 2.5 MG TABS tablet Take 1 tablet (2.5 mg total) by mouth 2 (two) times daily. (Patient not taking: Reported on 11/09/2017) 60 tablet 11 Not Taking at Unknown time   Scheduled: . amLODipine  10 mg Oral Daily  . aspirin EC  81 mg Oral Daily  .  atorvastatin  40 mg Oral Daily  . calcium acetate  2,001 mg Oral TID WC  . carvedilol  3.125 mg Oral BID WC  . citalopram  10 mg Oral Daily  . doxercalciferol  2 mcg Intravenous Q T,Th,Sa-HD  . feeding supplement (NEPRO CARB STEADY)  237 mL Oral Q lunch  . feeding supplement (PRO-STAT SUGAR FREE 64)  30 mL Oral BID  . gabapentin  100 mg Oral BID  . heparin injection (subcutaneous)  5,000 Units Subcutaneous Q8H  . hydrALAZINE  10 mg Oral Q8H  . insulin aspart  0-5 Units Subcutaneous QHS  . insulin aspart  0-9 Units Subcutaneous TID WC  . multivitamin  1 tablet Oral Daily  . pantoprazole  40 mg Oral Daily  . polyethylene glycol  17 g Oral QHS   Continuous:  [id number],  hydrALAZINE, HYDROcodone-acetaminophen, LORazepam, ondansetron (ZOFRAN) IV, psyllium, senna-docusate, zolpidem   ROS:                                                                                                                                       History obtained from the patient  General ROS: negative for - chills, fatigue, fever, night sweats, weight gain or weight loss Psychological ROS: negative for - behavioral disorder, hallucinations, memory difficulties, mood swings or suicidal ideation Ophthalmic ROS: negative for - blurry vision, double vision, eye pain or loss of vision ENT ROS: negative for - epistaxis, nasal discharge, oral lesions, sore throat, tinnitus or vertigo Allergy and Immunology ROS: negative for - hives or itchy/watery eyes Hematological and Lymphatic ROS: negative for - bleeding problems, bruising or swollen lymph nodes Endocrine ROS: negative for - galactorrhea, hair pattern changes, polydipsia/polyuria or temperature intolerance Respiratory ROS: negative for - cough, hemoptysis, shortness of breath or wheezing Cardiovascular ROS: negative for - chest pain, dyspnea on exertion, edema or irregular heartbeat Gastrointestinal ROS: negative for - abdominal pain, diarrhea, hematemesis, nausea/vomiting or stool incontinence Genito-Urinary ROS: negative for - dysuria, hematuria, incontinence or urinary frequency/urgency Musculoskeletal ROS: negative for - joint swelling or muscular weakness Neurological ROS: as noted in HPI Dermatological ROS: negative for rash and skin lesion changes   Blood pressure 114/60, pulse 76, temperature 99 F (37.2 C), temperature source Oral, resp. rate 14, height 5\' 5"  (1.651 m), weight 71 kg (156 lb 8.4 oz), SpO2 100 %.   Neurologic Examination:  HEENT-  Normocephalic, no lesions, without obvious abnormality.  Normal external eye and  conjunctiva.  Normal TM's bilaterally.  Normal auditory canals and external ears. Normal external nose, mucus membranes and septum.  Normal pharynx. Cardiovascular- S1, S2 normal, pulses palpable throughout   Lungs- chest clear, no wheezing, rales, normal symmetric air entry Abdomen- soft, non-tender; bowel sounds normal; no masses,  no organomegaly Extremities- no edema Lymph-no adenopathy palpable Musculoskeletal-no joint tenderness, deformity or swelling Skin-warm and dry, no hyperpigmentation, vitiligo, or suspicious lesions  Neurological Examination Mental Status: Alert, oriented, .  Speech dysarthric due to swollen tongue without evidence of aphasia.  Able to follow 3 step commands without difficulty. Cranial Nerves: [id number] fields grossly normal,  III,IV, VI: ptosis not present, extra-ocular motions intact bilaterally pupils equal, round, reactive to light and accommodation V,VII: smile symmetric, facial light touch sensation normal bilaterally VIII: hearing normal bilaterally IX,X: uvula rises symmetrically XI: bilateral shoulder shrug XII: midline tongue extension Motor: Right : Upper extremity   5/5    Left:     Upper extremity   5/5  Lower extremity   5/5     Lower extremity   5/5 Tone and bulk:normal tone throughout; no atrophy noted Sensory: Pinprick and light touch intact throughout, right peripheral neuropathy in leg Deep Tendon Reflexes: 1+ and symmetric throughout AKA on the left Plantars: Right: mute   Left: AKA Cerebellar: normal finger-to-nose, Gait: not tested     Lab Results: Basic Metabolic Panel: Recent Labs  Lab 11/09/17 1832 11/09/17 1852 11/10/17 0753 11/11/17 1502 11/12/17 1000 11/12/17 1748 11/13/17 0242  NA 136 137 138 134*  --  135 135  K 6.1* 6.0* 6.2* 5.6* 4.5 3.7 3.7  CL 101 107 102 98*  --  92* 99*  CO2 18*  --  19* 23  --  25 27  GLUCOSE 75 73 107* 182*  --  167* 104*  BUN 142* 108* 144* 80*  --  21* 26*  CREATININE 11.60*  12.50* 11.60* 8.90*  --  3.93* 4.68*  CALCIUM 8.5*  --  8.7* 9.0  --  9.2 9.1  PHOS  --   --  6.2* 5.9*  --   --   --     Liver Function Tests: Recent Labs  Lab 11/09/17 1832 11/10/17 0753 11/11/17 1502  AST 10*  --   --   ALT 19  --   --   ALKPHOS 159*  --   --   BILITOT 0.6  --   --   PROT 7.0  --   --   ALBUMIN 3.5 3.3* 3.2*   No results for input(s): LIPASE, AMYLASE in the last 168 hours. No results for input(s): AMMONIA in the last 168 hours.  CBC: Recent Labs  Lab 11/09/17 1832 11/09/17 1852 11/10/17 0753 11/11/17 1502 11/13/17 0242  WBC 5.8  --  5.6 6.0 12.9*  HGB 13.4 15.0 13.6 13.3 12.8  HCT 41.2 44.0 42.0 40.1 39.9  MCV 88.0  --  87.5 87.6 89.5  PLT 210  --  224 197 150    Cardiac Enzymes: No results for input(s): CKTOTAL, CKMB, CKMBINDEX, TROPONINI in the last 168 hours.  Lipid Panel: No results for input(s): CHOL, TRIG, HDL, CHOLHDL, VLDL, LDLCALC in the last 168 hours.  CBG: Recent Labs  Lab 11/12/17 1818 11/12/17 2053 11/12/17 2304 11/13/17 0606 11/13/17 0806  GLUCAP 160* 65 83 102* 33    Microbiology: Results for orders placed or performed during the hospital encounter  of 06/05/17  MRSA PCR Screening     Status: None   Collection Time: 06/06/17 12:25 AM  Result Value Ref Range Status   MRSA by PCR NEGATIVE NEGATIVE Final    Comment:        The GeneXpert MRSA Assay (FDA approved for NASAL specimens only), is one component of a comprehensive MRSA colonization surveillance program. It is not intended to diagnose MRSA infection nor to guide or monitor treatment for MRSA infections.     Coagulation Studies: No results for input(s): LABPROT, INR in the last 72 hours.  Imaging: Ct Head Wo Contrast  Result Date: 11/12/2017 CLINICAL DATA:  First time seizure activity during dialysis lasting for 3 minutes. EXAM: CT HEAD WITHOUT CONTRAST TECHNIQUE: Contiguous axial images were obtained from the base of the skull through the vertex  without intravenous contrast. COMPARISON:  CT from 19 seen 3232 FINDINGS: Brain: Bilateral basal ganglial lacunar infarcts with periventricular chronic small vessel ischemic disease. No large vascular territory infarction, intra-axial mass nor extra-axial fluid collections. No hydrocephalus. Vascular: Moderate atherosclerosis of the carotid siphons. No hyperdense vessels. Skull: No skull fracture Sinuses/Orbits: No acute finding.  Bilateral lens replacements. Other: None IMPRESSION: 1. No acute intracranial abnormality.  No skull fracture. 2. Chronic small vessel ischemic disease of periventricular white matter. 3. Chronic bilateral basal ganglial lacunar infarcts, right greater than left. Electronically Signed   By: Ashley Royalty M.D.   On: 11/12/2017 17:06      Etta Quill PA-C [facility] [phone]  11/13/2017, 9:08 AM   NEUROHOSPITALIST ADDENDUM Seen and examined the patient today.  ASSESSMENT AND PLAN   72 y.o.femalewith medical history significant ofESRD-on HD TTS, non compliance,hypertension, hyperlipidemia, diabetes mellitus, stroke, GERD, depression with anxiety, atrial fibrillation not on anticoagulants, OSA not on CPAP, PVD, critical lower limb ischemia,dCHF,s/p of L AKA who had a generalized seizure with tongue biting. This is her second seizure, first event apparently in 2016.  Previously on AED for few months but discontinued. She has been seen by neurology for TIAs in the past.   I definitely think this was a seizure and not convulsive syncope. She has a tongue bite. Seizures can occur during dialysis due to electrolyte shifts, but otherwise no provoking factors- she has hyperkalemia which usually doesn't provoke seizures. She has prior strokes, but they subcortical which do not cause seizures. An EEG done in 2017 showed left temporal slowing, but no epileptiform discahrges. However, this is her second seizure -  so I favor starting her on AED.  Seizures Will  start patient on Keppra 500 mg BID  R EEG ordered CT Head unremarkable Seizure precautions   F/U with her outpatient neurologist    Karena Addison Aroor MD [facility] [phone]  If 7pm to 7am, please call on call as listed on AMION.

## 2017-11-13 NOTE — Progress Notes (Signed)
PROGRESS NOTE    Deborah Hendricks  TKZ:[id number] DOB: 12-Nov-1946 DOA: 11/09/2017 PCP: [name], MD    Brief Narrative:  72 y.o. female with medical history significant of ESRD-on HD TTS, non compliance, hypertension, hyperlipidemia, diabetes mellitus, stroke, GERD, depression with anxiety, atrial fibrillation not on anticoagulants, OSA not on CPAP, PVD, critical lower limb ischemia, dCHF, s/p of L AKA, who presents with generalized weakness. Hyperkalemia on presentation K+ 6.0. BP 236/132.  Seizure like activity after dialysis yesterday 11/12/17. Transferred to stepdown unit. No reported recurrence. Remote hx of seizures with no recent treatment. Will have EEG done today. Neurology paged 11/12/17 and 11/13/17. Hx of non compliance to medications and medical management. Neurology will see her this am 11/13/17.   Assessment & Plan:   Principal Problem:   Hyperkalemia Active Problems:   Type II diabetes mellitus with neurological manifestations (HCC)   ESRD on dialysis (HCC)   Paroxysmal atrial fibrillation (HCC)   S/P AKA (above knee amputation) unilateral, left (HCC)   TIA (transient ischemic attack)   HLD (hyperlipidemia)   Hypertensive urgency   Hypertension  Hyperkalemia in the setting of ESRD non compliant with dialysis, resolved -At time of admission, K+6.0 without EKG change. Due to missing HD.  -Nephrology following -K+ 3.7 from 5.6 from 6.2  -kayexalate 30 g once 11/11/17 -Pt underwent HD on 11/12/17  ESRD (end stage renal disease) on dialysis (TTS):  -missed 4 sessions of HD.  -Pt presented azotemic and mildly uremic. -Nephrology consulted with HD per above  Type II diabetes mellitus with neurological manifestations ([place]):  -Last A1c 7.6, not well controled.  -Patient on insulin novolog at home -continue with SSI coverage -Glucose stable  Atrial Fibrillation:  -CHA2DS2-VASc Score is 8, needs oral anticoagulation.  -Patient was on Eliquis before which is  discontinued recently.  -Unclear why anticoagulation had been discontinued.  -Currently rate controlled -continue coreg as tolerated  TIA and stroke -continue ASA and lipitor as tolerated   HLD (hyperlipidemia) -continue lipitor as tolerated  Hypertensive urgency:  -Presenting blood pressure noted to be poorly controlled -will continue IV hydralazine when necessary -Continue Coreg -amlodipine 10 mg daily was added at time of admit  Ambulatory dysfunction with L AKA -PT evaluate and treat  Chronic Peripheral neuropathy -gabapentin increase from 100 mg daily to 100 mg BID -caution with dosing due to ESRD   DVT prophylaxis: Heparin subQ Code Status: DNR Family Communication: family not at bedside Disposition Plan: will stay another midnight to continue current management.  Consultants:   nephrology  Procedures:   none  Antimicrobials: Anti-infectives (From admission, onward)   None      Objective: Vitals:   11/12/17 1819 11/12/17 1933 11/12/17 2305 11/13/17 0430  BP: (!) 155/88 126/77 126/83 (!) 94/51  Pulse:  95 89 73  Resp:  (!) 21 19 13   Temp:  (!) 97.5 F (36.4 C) 99.4 F (37.4 C)   TempSrc:  Axillary Axillary   SpO2:  99% 99% 97%  Weight:    71 kg (156 lb 8.4 oz)  Height:        Intake/Output Summary (Last 24 hours) at 11/13/2017 0732 Last data filed at 11/12/2017 1823 Gross per 24 hour  Intake 580 ml  Output 1500 ml  Net -920 ml   Filed Weights   11/12/17 1029 11/12/17 1447 11/13/17 0430  Weight: 69.3 kg (152 lb 12.5 oz) 67.8 kg (149 lb 7.6 oz) 71 kg (156 lb 8.4 oz)    Examination:  General  exam: Appears calm and comfortable  Respiratory system: Clear to auscultation. Respiratory effort normal. Cardiovascular system: S1 & S2 heard, RRR Gastrointestinal system: Abdomen is nondistended, soft and nontender. No organomegaly or masses felt. Normal bowel sounds heard. Central nervous system: Alert and oriented. No focal neurological  deficits. Extremities: left AKA Skin: No rashes, lesions Psychiatry: Judgement and insight appear normal. Mood & affect appropriate.   Data Reviewed: I have personally reviewed following labs and imaging studies  CBC: Recent Labs  Lab 11/09/17 1832 11/09/17 1852 11/10/17 0753 11/11/17 1502 11/13/17 0242  WBC 5.8  --  5.6 6.0 12.9*  HGB 13.4 15.0 13.6 13.3 12.8  HCT 41.2 44.0 42.0 40.1 39.9  MCV 88.0  --  87.5 87.6 89.5  PLT 210  --  224 197 884   Basic Metabolic Panel: Recent Labs  Lab 11/09/17 1832 11/09/17 1852 11/10/17 0753 11/11/17 1502 11/12/17 1000 11/12/17 1748 11/13/17 0242  NA 136 137 138 134*  --  135 135  K 6.1* 6.0* 6.2* 5.6* 4.5 3.7 3.7  CL 101 107 102 98*  --  92* 99*  CO2 18*  --  19* 23  --  25 27  GLUCOSE 75 73 107* 182*  --  167* 104*  BUN 142* 108* 144* 80*  --  21* 26*  CREATININE 11.60* 12.50* 11.60* 8.90*  --  3.93* 4.68*  CALCIUM 8.5*  --  8.7* 9.0  --  9.2 9.1  PHOS  --   --  6.2* 5.9*  --   --   --    GFR: Estimated Creatinine Clearance: 10.7 mL/min (A) (by C-G formula based on SCr of 4.68 mg/dL (H)). Liver Function Tests: Recent Labs  Lab 11/09/17 1832 11/10/17 0753 11/11/17 1502  AST 10*  --   --   ALT 19  --   --   ALKPHOS 159*  --   --   BILITOT 0.6  --   --   PROT 7.0  --   --   ALBUMIN 3.5 3.3* 3.2*   No results for input(s): LIPASE, AMYLASE in the last 168 hours. No results for input(s): AMMONIA in the last 168 hours. Coagulation Profile: No results for input(s): INR, PROTIME in the last 168 hours. Cardiac Enzymes: No results for input(s): CKTOTAL, CKMB, CKMBINDEX, TROPONINI in the last 168 hours. BNP (last 3 results) No results for input(s): PROBNP in the last 8760 hours. HbA1C: No results for input(s): HGBA1C in the last 72 hours. CBG: Recent Labs  Lab 11/12/17 1553 11/12/17 1818 11/12/17 2053 11/12/17 2304 11/13/17 0606  GLUCAP 140* 160* 65 83 102*   Lipid Profile: No results for input(s): CHOL, HDL,  LDLCALC, TRIG, CHOLHDL, LDLDIRECT in the last 72 hours. Thyroid Function Tests: No results for input(s): TSH, T4TOTAL, FREET4, T3FREE, THYROIDAB in the last 72 hours. Anemia Panel: No results for input(s): VITAMINB12, FOLATE, FERRITIN, TIBC, IRON, RETICCTPCT in the last 72 hours. Sepsis Labs: No results for input(s): PROCALCITON, LATICACIDVEN in the last 168 hours.  No results found for this or any previous visit (from the past 240 hour(s)).   Radiology Studies: Ct Head Wo Contrast  Result Date: 11/12/2017 CLINICAL DATA:  First time seizure activity during dialysis lasting for 3 minutes. EXAM: CT HEAD WITHOUT CONTRAST TECHNIQUE: Contiguous axial images were obtained from the base of the skull through the vertex without intravenous contrast. COMPARISON:  CT from 19 seen 3232 FINDINGS: Brain: Bilateral basal ganglial lacunar infarcts with periventricular chronic small vessel ischemic disease. No  large vascular territory infarction, intra-axial mass nor extra-axial fluid collections. No hydrocephalus. Vascular: Moderate atherosclerosis of the carotid siphons. No hyperdense vessels. Skull: No skull fracture Sinuses/Orbits: No acute finding.  Bilateral lens replacements. Other: None IMPRESSION: 1. No acute intracranial abnormality.  No skull fracture. 2. Chronic small vessel ischemic disease of periventricular white matter. 3. Chronic bilateral basal ganglial lacunar infarcts, right greater than left. Electronically Signed   By: Ashley Royalty M.D.   On: 11/12/2017 17:06    Scheduled Meds: . amLODipine  10 mg Oral Daily  . aspirin EC  81 mg Oral Daily  . atorvastatin  40 mg Oral Daily  . calcium acetate  2,001 mg Oral TID WC  . carvedilol  3.125 mg Oral BID WC  . citalopram  10 mg Oral Daily  . doxercalciferol  2 mcg Intravenous Q T,Th,Sa-HD  . feeding supplement (NEPRO CARB STEADY)  237 mL Oral Q lunch  . feeding supplement (PRO-STAT SUGAR FREE 64)  30 mL Oral BID  . gabapentin  100 mg Oral QHS  .  heparin injection (subcutaneous)  5,000 Units Subcutaneous Q8H  . hydrALAZINE  10 mg Oral Q8H  . insulin aspart  0-5 Units Subcutaneous QHS  . insulin aspart  0-9 Units Subcutaneous TID WC  . multivitamin  1 tablet Oral Daily  . pantoprazole  40 mg Oral Daily  . polyethylene glycol  17 g Oral QHS   Continuous Infusions:   LOS: 4 days   Kayleen Memos, MD [facility] Pager [phone]  If 7PM-7AM, please contact night-coverage [email or web address] Password [facility] 11/13/2017, 7:32 AM

## 2017-11-13 NOTE — Progress Notes (Signed)
EEG complete - results pending 

## 2017-11-13 NOTE — Evaluation (Addendum)
Clinical/Bedside Swallow Evaluation Patient Details  Name: Deborah Hendricks MRN: [id number] Date of Birth: 1946-03-04  Today's Date: 11/13/2017 Time: SLP Start Time (ACUTE ONLY): 1332 SLP Stop Time (ACUTE ONLY): 2878 SLP Time Calculation (min) (ACUTE ONLY): 15 min  Past Medical History:  Past Medical History:  Diagnosis Date  . Anemia   . Anxiety   . CHF (congestive heart failure) ([place])   . Constipation   . Critical lower limb ischemia   . Depression   . ESRD on dialysis [facility])    "TTS; [place]" (11/27/2016)  . GERD (gastroesophageal reflux disease)   . Heart murmur   . HLD (hyperlipidemia)   . Hypertension   . Memory loss   . Peripheral vascular disease ([place])   . Refusal of blood transfusions as patient is Jehovah's Witness   . Seizures ([place]) 04/2015   s/p CVA.  [name] Sleep apnea    Noncompliant with CPAP.  [name] Stroke ([place]) 2015  . Type II diabetes mellitus ([place])    Past Surgical History:  Past Surgical History:  Procedure Laterality Date  . ABDOMINAL AORTOGRAM W/LOWER EXTREMITY Right 06/13/2017   Procedure: Abdominal Aortogram w/Lower Extremity;  Surgeon: Waynetta Sandy, MD;  Location: [place] [facility] LAB;  Service: Cardiovascular;  Laterality: Right;  . ABDOMINAL HYSTERECTOMY    . AMPUTATION Left 12/15/2015   Procedure: LEFT ABOVE KNEE AMPUTATION;  Surgeon: Serafina Mitchell, MD;  Location: [facility];  Service: Vascular;  Laterality: Left;  . AMPUTATION Right 06/19/2017   Procedure: AMPUTATION right great toe;  Surgeon: Serafina Mitchell, MD;  Location: [facility];  Service: Vascular;  Laterality: Right;  . BASCILIC VEIN TRANSPOSITION Right 11/25/2015   Procedure: Right Arm FIRST STAGE BASILIC VEIN TRANSPOSITION;  Surgeon: Serafina Mitchell, MD;  Location: [place];  Service: Vascular;  Laterality: Right;  . BASCILIC VEIN TRANSPOSITION Right 01/19/2016   Procedure: RIGHT SECOND STAGE BASILIC VEIN TRANSPOSITION;  Surgeon: Serafina Mitchell, MD;  Location: [facility];  Service: Vascular;  Laterality:  Right;  . DILATION AND CURETTAGE OF UTERUS    . EYE SURGERY    . IR FLUORO GUIDE CV LINE RIGHT  06/05/2017  . PERIPHERAL VASCULAR BALLOON ANGIOPLASTY Right 06/13/2017   Procedure: PERIPHERAL VASCULAR BALLOON ANGIOPLASTY;  Surgeon: Waynetta Sandy, MD;  Location: [place] CV LAB;  Service: Cardiovascular;  Laterality: Right;  POPLITEAL/SFA/PERONEAL  . PERIPHERAL VASCULAR CATHETERIZATION Bilateral 11/28/2015   Procedure: Lower Extremity Angiography;  Surgeon: Lorretta Harp, MD;  Location: [facility] [facility] LAB;  Service: Cardiovascular;  Laterality: Bilateral;  . PERIPHERAL VASCULAR CATHETERIZATION N/A 11/28/2015   Procedure: Abdominal Aortogram;  Surgeon: Lorretta Harp, MD;  Location: [facility] LAB;  Service: Cardiovascular;  Laterality: N/A;  . PERIPHERAL VASCULAR CATHETERIZATION Right 11/27/2016  . PERIPHERAL VASCULAR CATHETERIZATION N/A 11/27/2016   Procedure: Abdominal Aortogram w/Lower Extremity;  Surgeon: Serafina Mitchell, MD;  Location: [place] CV LAB;  Service: Cardiovascular;  Laterality: N/A;  . PERIPHERAL VASCULAR CATHETERIZATION N/A 11/27/2016   Procedure: Right arm and central venogram;  Surgeon: Serafina Mitchell, MD;  Location: [place] [facility];  Service: Cardiovascular;  Laterality: N/A;  . PERIPHERAL VASCULAR CATHETERIZATION Right 11/27/2016   Procedure: Peripheral Vascular Atherectomy;  Surgeon: Serafina Mitchell, MD;  Location: [place] CV LAB;  Service: Cardiovascular;  Laterality: Right;  Rt    sfa, popliteal, tibioperoneal trunk, peroneal  . PERIPHERAL VASCULAR CATHETERIZATION Right 11/27/2016   Procedure: Peripheral Vascular Balloon Angioplasty;  Surgeon: Serafina Mitchell,  MD;  Location: [place] CV LAB;  Service: Cardiovascular;  Laterality: Right;  Tibial peroneal  . RETINOPATHY SURGERY Bilateral   . TOE AMPUTATION Right    2nd, 3rd, 4th, 5th digits  . TONSILLECTOMY     HPI:  Pt is a 72 y.o. female admitted from SNF on 11/09/17 with generalized  weakness; found to have hyperkalemia in setting of ESRD non-compliant with HD. Pt with h/o non-compliance with medications and medical management with multiple admissions to [facility]. Of note, had seizure-like activity after HD on 11/12/17; EEG pending. CT showed chronic bilateral basal ganglial lacunar infarcts. Other PMH includes L AKA, DM, a-fib, TIA, CVA, HLD, HTN, peripheral neuropathy.MBS 01/2017 swallow delay, silent penetration before the swallow with thin, very slow esophageal clearance (full column of barium) and regular texture, nectar thick liquids recommended. Pt bit tongue during seizure and ST ordered.    Assessment / Plan / Recommendation Clinical Impression  Pt reported swallow to be [facility] just prior to admisssion; discussed history of pharyngeal impairments. Lingual ROM and strength decreased due to trauma during seizure. Tongue is edematous and bruised. Manipulated applesauce with mild facial grimace, deliberate movements and straw sips thin. Solid/regular texture not attempted. Educated pt re: various textures and decided upon full liquids and upgrading as oral cavity trauma decreased. Allowed to use straws, pills whole in applesauce (unless large). Will follow for tolerance and upgrade.   SLP Visit Diagnosis: Dysphagia, oral phase (R13.11)    Aspiration Risk  Mild aspiration risk    Diet Recommendation Thin liquid(full liquids)   Liquid Administration via: Cup;Straw Medication Administration: Whole meds with puree Supervision: Patient able to self feed;Intermittent supervision to cue for compensatory strategies Compensations: Small sips/bites;Slow rate Postural Changes: Seated upright at 90 degrees    Other  Recommendations Oral Care Recommendations: Oral care BID   Follow up Recommendations None      Frequency and Duration min 1 x/week  2 weeks       Prognosis Prognosis for Safe Diet Advancement: Good      Swallow Study   General HPI: Pt is a 71 y.o. female admitted from  SNF on 11/09/17 with generalized weakness; found to have hyperkalemia in setting of ESRD non-compliant with HD. Pt with h/o non-compliance with medications and medical management with multiple admissions to [facility]. Of note, had seizure-like activity after HD on 11/12/17; EEG pending. CT showed chronic bilateral basal ganglial lacunar infarcts. Other PMH includes L AKA, DM, a-fib, TIA, CVA, HLD, HTN, peripheral neuropathy.MBS 01/2017 swallow delay, silent penetration before the swallow with thin, very slow esophageal clearance (full column of barium) and regular texture, nectar thick liquids recommended. Pt bit tongue during seizure and ST ordered.  Type of Study: Bedside Swallow Evaluation Previous Swallow Assessment: (see HPI) Diet Prior to this Study: NPO Temperature Spikes Noted: No Respiratory Status: Room air History of Recent Intubation: No Behavior/Cognition: Alert;Cooperative;Pleasant mood Oral Cavity Assessment: Edema;Other (comment)(lingual edema) Oral Care Completed by SLP: No Oral Cavity - Dentition: Adequate natural dentition Vision: Functional for self-feeding Self-Feeding Abilities: Able to feed self Patient Positioning: Upright in bed Baseline Vocal Quality: Normal Volitional Cough: Weak(likely due to pain) Volitional Swallow: Able to elicit    Oral/Motor/Sensory Function Overall Oral Motor/Sensory Function: Mild impairment Facial ROM: Within Functional Limits Facial Symmetry: Within Functional Limits Facial Strength: Within Functional Limits Facial Sensation: Within Functional Limits Lingual ROM: Other (Comment)(decreased bilaterally) Lingual Symmetry: Within Functional Limits Lingual Strength: Other (Comment)(decreased d/t pain)   Ice Chips Ice chips: Not tested  Thin Liquid Thin Liquid: Within functional limits Presentation: Cup;Straw    Nectar Thick Nectar Thick Liquid: Not tested   Honey Thick Honey Thick Liquid: Not tested   Puree Puree: Within functional limits    Solid   GO   Solid: Not tested        Houston Siren 11/13/2017,2:06 PM Orbie Pyo Colvin Caroli.Ed [employer] [phone]

## 2017-11-13 NOTE — Progress Notes (Addendum)
Patient ID: Deborah Hendricks, female   DOB: [birth year], 72 y.o.   MRN: [id number]  [facility] Progress Note    Subjective:   Pt suffered seizure at the end of HD yesterday with myoclonus and tongue biting.  Had some hypotension before event but then developed elevated BP.  Feels much better today and doesn't know what happened yesterday.   Objective:   BP 114/60 (BP Location: Left Arm)   Pulse 76   Temp 99 F (37.2 C) (Oral)   Resp 14   Ht 5\' 5"  (1.651 m)   Wt 71 kg (156 lb 8.4 oz)   SpO2 100%   BMI 26.05 kg/m   Intake/Output: I/O last 3 completed shifts: In: 95 [P.O.:820] Out: 7353 [Urine:150; Other:1500]   Intake/Output this shift:  No intake/output data recorded. Weight change: 0.7 kg (1 lb 8.7 oz)  Physical Exam: Gen: NAd CVS:no rub Resp: cta Abd: benign Ext: no edema, s/p LAKA  Labs: BMET Recent Labs  Lab 11/09/17 1832 11/09/17 1852 11/10/17 0753 11/11/17 1502 11/12/17 1000 11/12/17 1748 11/13/17 0242  NA 136 137 138 134*  --  135 135  K 6.1* 6.0* 6.2* 5.6* 4.5 3.7 3.7  CL 101 107 102 98*  --  92* 99*  CO2 18*  --  19* 23  --  25 27  GLUCOSE 75 73 107* 182*  --  167* 104*  BUN 142* 108* 144* 80*  --  21* 26*  CREATININE 11.60* 12.50* 11.60* 8.90*  --  3.93* 4.68*  ALBUMIN 3.5  --  3.3* 3.2*  --   --   --   CALCIUM 8.5*  --  8.7* 9.0  --  9.2 9.1  PHOS  --   --  6.2* 5.9*  --   --   --    CBC Recent Labs  Lab 11/09/17 1832 11/09/17 1852 11/10/17 0753 11/11/17 1502 11/13/17 0242  WBC 5.8  --  5.6 6.0 12.9*  HGB 13.4 15.0 13.6 13.3 12.8  HCT 41.2 44.0 42.0 40.1 39.9  MCV 88.0  --  87.5 87.6 89.5  PLT 210  --  224 197 150    @IMGRELPRIORS @ Medications:    . amLODipine  10 mg Oral Daily  . aspirin  81 mg Oral Daily  . atorvastatin  40 mg Oral Daily  . calcium acetate  2,001 mg Oral TID WC  . carvedilol  3.125 mg Oral BID WC  . citalopram  10 mg Oral Daily  . doxercalciferol  2 mcg Intravenous Q T,Th,Sa-HD  . feeding  supplement (NEPRO CARB STEADY)  237 mL Oral Q lunch  . feeding supplement (PRO-STAT SUGAR FREE 64)  30 mL Oral BID  . gabapentin  100 mg Oral BID  . heparin injection (subcutaneous)  5,000 Units Subcutaneous Q8H  . hydrALAZINE  10 mg Oral Q8H  . insulin aspart  0-5 Units Subcutaneous QHS  . insulin aspart  0-9 Units Subcutaneous TID WC  . multivitamin  1 tablet Oral Daily  . pantoprazole sodium  40 mg Per Tube Daily  . polyethylene glycol  17 g Oral QHS   Dialysis Orders: TTS at [place], BFR 400, DFR 800, EDW 65.5kg, 2K/2Ca, Linear Na, Prof 4, heparin 5K bolus - Hectoral 8mcg IV q HD  Assessment/ Plan:   1. Seizure- appreciate Neuro input.  She did have a history of seizure activity in the past and had been on meds but was stopped some time ago.  CT scan without new findings.  EEG pending and further workup per Neuro.  2. Non-compliance with HD: Mild uremia/significant azotemia on admit. S/p HD 12/30 and back again per holiday schedule. Says she never wanted to stop HD, says she wishes to move back to [place] where she originally lived. Feels like she has no support here. Had missed 4 HD prior to admit. 3. Hyperkalemia: K 6.2 on admit, s/p HD 12/30. K+ 5.6 prior to HD yesterday. Recheck renal function panel today.  4. ESRD:TTS schedule.  5. HTN/volume:HD yesterday pre wt 68.8 Net UF +151 Post wt 68.8 kg. Was not getting to EDW at OP center. Wts here appear similar to those at center. May need to raise EDW. Attempt 1-1.5 liters today. Scr still elevated at 8.9 (usually in 5-6 range) yesterday. Check labs today.  6. Anemia:Hgb 13.6. No ESA for now. 7. Secondary hyperparathyroidism:Corr Ca ok, Phos high. Continue binders/VDRA. 8. Nutrition:Alb low, continue supplements. 9. DM:On insulin, per primary. 10. DIsposition- poor prognosis with FTT.  Recommend Palliative Care consult to help set goals/limits of care.   Donetta Potts, MD [place] Pager [phone] 11/13/2017, 11:40 AM

## 2017-11-13 NOTE — Evaluation (Signed)
Physical Therapy Evaluation Patient Details Name: Deborah Hendricks MRN: [id number] DOB: [birth year] Today's Date: 11/13/2017   History of Present Illness  Pt is a 72 y.o. female admitted from SNF on 11/09/17 with generalized weakness; found to have hyperkalemia in setting of ESRD non-compliant with HD. Pt with h/o non-compliance with medications and medical management with multiple admissions to [facility]. Of note, had seizure-like activity after HD on 11/12/17; EEG pending. CT showed chronic bilateral basal ganglial lacunar infarcts. Other PMH includes L AKA, DM, a-fib, TIA, CVA, HLD, HTN, peripheral neuropathy.    Clinical Impression  Pt presents with an overall decrease in functional mobility secondary to above. PTA, pt receiving PT at [facility]; plans to return here at d/c. Today, required maxA for lateral scoot transfer from bed to recliner. Pt would benefit from continued acute PT services to maximize functional mobility and independence prior to d/c with continued SNF-level therapies.     Follow Up Recommendations SNF;Supervision/Assistance - 24 hour    Equipment Recommendations  None recommended by PT    Recommendations for Other Services       Precautions / Restrictions Precautions Precautions: Fall Precaution Comments: L AKA Restrictions Weight Bearing Restrictions: No      Mobility  Bed Mobility Overal bed mobility: Needs Assistance Bed Mobility: Supine to Sit     Supine to sit: Mod assist;HOB elevated;Max assist     General bed mobility comments: ModA for UE support to assist trunk into sitting; maxA to scoot hips to EOB  Transfers Overall transfer level: Needs assistance Equipment used: None Transfers: Lateral/Scoot Transfers          Lateral/Scoot Transfers: Max assist General transfer comment: MaxA to assist lateral scoot pivot from bed to drop-arm recliner; pt with decreased ability to use RLE to pivot, requiring maxA and use of pad for  transfer  Ambulation/Gait                Stairs            Wheelchair Mobility    Modified Rankin (Stroke Patients Only)       Balance Overall balance assessment: Needs assistance   Sitting balance-Leahy Scale: Fair                                       Pertinent Vitals/Pain Pain Assessment: No/denies pain    Home Living Family/patient expects to be discharged to:: Skilled nursing facility                 Additional Comments: Plans to d/c back to SNF and continue working with PT there    Prior Function Level of Independence: Needs assistance   Gait / Transfers Assistance Needed: Pt reports currently working with PT on transfers to/from w/c. Requires assist for this  ADL's / Homemaking Assistance Needed: Pt reports she can get to toilet from w/c by herself (unsure this is reliable information). SNF provides meals and assist as needed for ADLs        Hand Dominance        Extremity/Trunk Assessment   Upper Extremity Assessment Upper Extremity Assessment: Generalized weakness    Lower Extremity Assessment Lower Extremity Assessment: Generalized weakness;LLE deficits/detail LLE Deficits / Details: L AKA       Communication   Communication: Expressive difficulties(secondary to swollen tongue)  Cognition Arousal/Alertness: Awake/alert Behavior During Therapy: WFL for tasks assessed/performed Overall Cognitive Status:  No family/caregiver present to determine baseline cognitive functioning                                 General Comments: Appropriately interactive and following commands, but appears to have cognitive deficit at baseline with potential short-term memory issues      General Comments      Exercises     Assessment/Plan    PT Assessment Patient needs continued PT services  PT Problem List Decreased strength;Decreased activity tolerance;Decreased balance;Decreased mobility       PT  Treatment Interventions DME instruction;Gait training;Functional mobility training;Therapeutic activities;Therapeutic exercise;Balance training;Patient/family education;Wheelchair mobility training    PT Goals (Current goals can be found in the Care Plan section)  Acute Rehab PT Goals Patient Stated Goal: Continue working with PT at SNF PT Goal Formulation: With patient Time For Goal Achievement: 11/27/17 Potential to Achieve Goals: Good    Frequency Min 2X/week   Barriers to discharge        Co-evaluation               AM-PAC PT "6 Clicks" Daily Activity  Outcome Measure Difficulty turning over in bed (including adjusting bedclothes, sheets and blankets)?: Unable Difficulty moving from lying on back to sitting on the side of the bed? : Unable Difficulty sitting down on and standing up from a chair with arms (e.g., wheelchair, bedside commode, etc,.)?: Unable Help needed moving to and from a bed to chair (including a wheelchair)?: A Lot Help needed walking in hospital room?: Total Help needed climbing 3-5 steps with a railing? : Total 6 Click Score: 7    End of Session   Activity Tolerance: Patient tolerated treatment well;Patient limited by fatigue Patient left: in chair;with call bell/phone within reach Nurse Communication: Mobility status PT Visit Diagnosis: Other abnormalities of gait and mobility (R26.89);Muscle weakness (generalized) (M62.81)    Time: 4193-7902 PT Time Calculation (min) (ACUTE ONLY): 24 min   Charges:   PT Evaluation $PT Eval Moderate Complexity: 1 Mod PT Treatments $Therapeutic Activity: 8-22 mins   PT G Codes:       Mabeline Caras, PT, DPT Acute Rehab Services  Pager: [place] 11/13/2017, 10:45 AM

## 2017-11-14 DIAGNOSIS — E1149 Type 2 diabetes mellitus with other diabetic neurological complication: Secondary | ICD-10-CM

## 2017-11-14 DIAGNOSIS — R569 Unspecified convulsions: Secondary | ICD-10-CM

## 2017-11-14 DIAGNOSIS — G253 Myoclonus: Secondary | ICD-10-CM

## 2017-11-14 DIAGNOSIS — E875 Hyperkalemia: Principal | ICD-10-CM

## 2017-11-14 DIAGNOSIS — Z992 Dependence on renal dialysis: Secondary | ICD-10-CM

## 2017-11-14 DIAGNOSIS — Z89612 Acquired absence of left leg above knee: Secondary | ICD-10-CM

## 2017-11-14 DIAGNOSIS — N186 End stage renal disease: Secondary | ICD-10-CM

## 2017-11-14 DIAGNOSIS — I480 Paroxysmal atrial fibrillation: Secondary | ICD-10-CM

## 2017-11-14 DIAGNOSIS — I10 Essential (primary) hypertension: Secondary | ICD-10-CM

## 2017-11-14 LAB — RENAL FUNCTION PANEL
ANION GAP: 9 (ref 5–15)
Albumin: 2.8 g/dL — ABNORMAL LOW (ref 3.5–5.0)
BUN: 48 mg/dL — ABNORMAL HIGH (ref 6–20)
CHLORIDE: 98 mmol/L — AB (ref 101–111)
CO2: 29 mmol/L (ref 22–32)
Calcium: 8.6 mg/dL — ABNORMAL LOW (ref 8.9–10.3)
Creatinine, Ser: 6.37 mg/dL — ABNORMAL HIGH (ref 0.44–1.00)
GFR calc Af Amer: 7 mL/min — ABNORMAL LOW (ref >60)
GFR calc non Af Amer: 6 mL/min — ABNORMAL LOW (ref >60)
GLUCOSE: 198 mg/dL — AB (ref 65–99)
POTASSIUM: 5 mmol/L (ref 3.5–5.1)
Phosphorus: 5 mg/dL — ABNORMAL HIGH (ref 2.5–4.6)
SODIUM: 136 mmol/L (ref 135–145)

## 2017-11-14 LAB — GLUCOSE, CAPILLARY
GLUCOSE-CAPILLARY: 96 mg/dL (ref 65–99)
Glucose-Capillary: 123 mg/dL — ABNORMAL HIGH (ref 65–99)
Glucose-Capillary: 153 mg/dL — ABNORMAL HIGH (ref 65–99)

## 2017-11-14 LAB — CBC
HEMATOCRIT: 37.6 % (ref 36.0–46.0)
HEMOGLOBIN: 11.7 g/dL — AB (ref 12.0–15.0)
MCH: 28.3 pg (ref 26.0–34.0)
MCHC: 31.1 g/dL (ref 30.0–36.0)
MCV: 91 fL (ref 78.0–100.0)
Platelets: 151 10*3/uL (ref 150–400)
RBC: 4.13 MIL/uL (ref 3.87–5.11)
RDW: 17.9 % — ABNORMAL HIGH (ref 11.5–15.5)
WBC: 9 10*3/uL (ref 4.0–10.5)

## 2017-11-14 LAB — MRSA PCR SCREENING: MRSA BY PCR: NEGATIVE

## 2017-11-14 MED ORDER — HEPARIN SODIUM (PORCINE) 1000 UNIT/ML DIALYSIS
100.0000 [IU]/kg | INTRAMUSCULAR | Status: DC | PRN
  Filled 2017-11-14: qty 7

## 2017-11-14 MED ORDER — PANTOPRAZOLE SODIUM 40 MG PO TBEC
40.0000 mg | DELAYED_RELEASE_TABLET | Freq: Every day | ORAL | Status: DC
  Administered 2017-11-14 – 2017-11-15 (×2): 40 mg via ORAL
  Filled 2017-11-14 (×2): qty 1

## 2017-11-14 MED ORDER — DOXERCALCIFEROL 4 MCG/2ML IV SOLN
INTRAVENOUS | Status: AC
  Administered 2017-11-14: 2 ug via INTRAVENOUS
  Filled 2017-11-14: qty 2

## 2017-11-14 MED ORDER — LEVETIRACETAM 500 MG PO TABS
500.0000 mg | ORAL_TABLET | Freq: Two times a day (BID) | ORAL | Status: DC
  Administered 2017-11-15: 500 mg via ORAL
  Filled 2017-11-14: qty 1

## 2017-11-14 NOTE — Progress Notes (Signed)
Physical Therapy Treatment Patient Details Name: Deborah Hendricks MRN: [id number] DOB: Dec 28, 1945 Today's Date: 11/14/2017    History of Present Illness Pt is a 72 y.o. female admitted from SNF on 11/09/17 with generalized weakness; found to have hyperkalemia in setting of ESRD non-compliant with HD. Pt with h/o non-compliance with medications and medical management with multiple admissions to [facility]. Of note, had seizure-like activity after HD on 11/12/17; EEG pending. CT showed chronic bilateral basal ganglial lacunar infarcts. Other PMH includes L AKA, DM, a-fib, TIA, CVA, HLD, HTN, peripheral neuropathy.   PT Comments    Pt declining OOB mobility or seated EOB therex secondary to c/o discomfort and nausea, despite max encouragement. Agreeable to supine therex. Easily fatigued with BLE therex, but motivated to participate. Continues to demonstrate decreased attention and short-term memory. Will continue to follow acutely.   Follow Up Recommendations  SNF;Supervision/Assistance - 24 hour     Equipment Recommendations  None recommended by PT    Recommendations for Other Services       Precautions / Restrictions Precautions Precautions: Fall Precaution Comments: L AKA Restrictions Weight Bearing Restrictions: No    Mobility  Bed Mobility               General bed mobility comments: Pt declining sitting EOB due to discomfort, feeling lightheaded and nauseoues. Agreeable to supine therex  Transfers                    Ambulation/Gait                 Stairs            Wheelchair Mobility    Modified Rankin (Stroke Patients Only)       Balance                                            Cognition Arousal/Alertness: Awake/alert Behavior During Therapy: WFL for tasks assessed/performed Overall Cognitive Status: No family/caregiver present to determine baseline cognitive functioning                                  General Comments: Appropriately interactive and following commands, but appears to have cognitive deficit at baseline with potential short-term memory issues      Exercises General Exercises - Lower Extremity Ankle Circles/Pumps: AROM;Right;10 reps;Supine Gluteal Sets: AROM;Both;10 reps;Supine Heel Slides: AROM;Right;20 reps;Supine Hip ABduction/ADduction: AROM;Both;20 reps;Supine Straight Leg Raises: AROM;Right;20 reps Hip Flexion/Marching: AROM;Left;20 reps;Supine    General Comments        Pertinent Vitals/Pain Pain Assessment: Faces Faces Pain Scale: Hurts a little bit Pain Location: Tongue Pain Descriptors / Indicators: Sore Pain Intervention(s): Monitored during session    Home Living                      Prior Function            PT Goals (current goals can now be found in the care plan section) Acute Rehab PT Goals Patient Stated Goal: Continue working with PT at SNF PT Goal Formulation: With patient Time For Goal Achievement: 11/27/17 Potential to Achieve Goals: Good Progress towards PT goals: Progressing toward goals    Frequency    Min 2X/week      PT Plan Current plan remains appropriate  Co-evaluation              AM-PAC PT "6 Clicks" Daily Activity  Outcome Measure  Difficulty turning over in bed (including adjusting bedclothes, sheets and blankets)?: Unable Difficulty moving from lying on back to sitting on the side of the bed? : Unable Difficulty sitting down on and standing up from a chair with arms (e.g., wheelchair, bedside commode, etc,.)?: Unable Help needed moving to and from a bed to chair (including a wheelchair)?: A Lot Help needed walking in hospital room?: Total Help needed climbing 3-5 steps with a railing? : Total 6 Click Score: 7    End of Session   Activity Tolerance: Patient tolerated treatment well;Patient limited by fatigue Patient left: in bed;with call bell/phone within reach Nurse Communication:  Mobility status PT Visit Diagnosis: Other abnormalities of gait and mobility (R26.89);Muscle weakness (generalized) (M62.81)     Time: 4799-8721 PT Time Calculation (min) (ACUTE ONLY): 24 min  Charges:  $Therapeutic Exercise: 23-37 mins                    G Codes:      Mabeline Caras, PT, DPT Acute Rehab Services  Pager: [place] 11/14/2017, 10:56 AM

## 2017-11-14 NOTE — Procedures (Signed)
ELECTROENCEPHALOGRAM REPORT  Date of Study: 11/13/2017 (assigned to this provider on 11/14/2017)  Patient's Name: Deborah Hendricks MRN: [id number] Date of Birth: [birth year]  Referring Provider: Dr. Irene Pap  Clinical History: This is a 72 year old woman with a period of myoclonus with tongue bite.  Medications: keppra Neurontin Celexa Tylenol Aspirin Coreg Lipitor  Technical Summary: A multichannel digital EEG recording measured by the international 10-20 system with electrodes applied with paste and impedances below 5000 ohms performed in our laboratory with EKG monitoring in an awake and asleep patient.  Hyperventilation was not performed. Photic stimulation was performed.  The digital EEG was referentially recorded, reformatted, and digitally filtered in a variety of bipolar and referential montages for optimal display.    Description: The patient is awake and asleep during the recording.  During maximal wakefulness, there is a symmetric, medium voltage 10 Hz posterior dominant rhythm that attenuates with eye opening.  The record is symmetric.  During drowsiness and sleep, there is an increase in theta slowing of the background.  Vertex waves and symmetric sleep spindles were seen.  Photic stimulation did not elicit any abnormalities.  There were no epileptiform discharges or electrographic seizures seen.    EKG lead was unremarkable.  Impression: This awake and asleep EEG is normal.    Clinical Correlation: A normal EEG does not exclude a clinical diagnosis of epilepsy. Clinical correlation is advised.   Ellouise Newer, M.D.

## 2017-11-14 NOTE — Progress Notes (Signed)
Assessment/ Plan:   1. Seizure- now on Keppra per Neuro  2. Non-compliance with HD: Mild uremia/significant azotemia on after missing 4 Txs 3. Hyperkalemia: resolved  4. ESRD:TTS schedule.Due today. 5.   HTN/volume:Need higher EDW at discharge  Dialysis Orders: TTS at [place], BFR 400, DFR 800, EDW 65.5kg, 2K/2Ca, Linear Na, Prof 4, heparin 5K bolus - Hectoral 85mcg IV q HD  Subjective: Interval History: none.  Objective: Vital signs in last 24 hours: Temp:  [97.6 F (36.4 C)-98.4 F (36.9 C)] 97.6 F (36.4 C) (01/03 1220) Pulse Rate:  [61-78] 69 (01/03 1245) Resp:  [5-21] 13 (01/03 1232) BP: (86-119)/(44-63) 86/44 (01/03 1245) SpO2:  [96 %-100 %] 96 % (01/03 1220) Weight:  [69.7 kg (153 lb 10.6 oz)-70.4 kg (155 lb 3.3 oz)] 70.4 kg (155 lb 3.3 oz) (01/03 1220) Weight change: 0.4 kg (14.1 oz)  Intake/Output from previous day: 01/02 0701 - 01/03 0700 In: 385 [P.O.:280; IV Piggyback:105] Out: -  Intake/Output this shift: No intake/output data recorded.  General appearance: alert and cooperative Resp: clear to auscultation bilaterally Chest wall: no tenderness Cardio: regular rate and rhythm, S1, S2 normal, no murmur, click, rub or gallop Extremities:, atraumatic, no cyanosis or edema; L AKA. r groin TDC  Lab Results: Recent Labs    11/11/17 1502 11/13/17 0242  WBC 6.0 12.9*  HGB 13.3 12.8  HCT 40.1 39.9  PLT 197 150   BMET:  Recent Labs    11/12/17 1748 11/13/17 0242  NA 135 135  K 3.7 3.7  CL 92* 99*  CO2 25 27  GLUCOSE 167* 104*  BUN 21* 26*  CREATININE 3.93* 4.68*  CALCIUM 9.2 9.1   No results for input(s): PTH in the last 72 hours. Iron Studies: No results for input(s): IRON, TIBC, TRANSFERRIN, FERRITIN in the last 72 hours. Studies/Results: Ct Head Wo Contrast  Result Date: 11/12/2017 CLINICAL DATA:  First time seizure activity during dialysis lasting for 3 minutes. EXAM: CT HEAD WITHOUT CONTRAST TECHNIQUE: Contiguous axial  images were obtained from the base of the skull through the vertex without intravenous contrast. COMPARISON:  CT from 19 seen 3232 FINDINGS: Brain: Bilateral basal ganglial lacunar infarcts with periventricular chronic small vessel ischemic disease. No large vascular territory infarction, intra-axial mass nor extra-axial fluid collections. No hydrocephalus. Vascular: Moderate atherosclerosis of the carotid siphons. No hyperdense vessels. Skull: No skull fracture Sinuses/Orbits: No acute finding.  Bilateral lens replacements. Other: None IMPRESSION: 1. No acute intracranial abnormality.  No skull fracture. 2. Chronic small vessel ischemic disease of periventricular white matter. 3. Chronic bilateral basal ganglial lacunar infarcts, right greater than left. Electronically Signed   By: Ashley Royalty M.D.   On: 11/12/2017 17:06    Scheduled: . amLODipine  10 mg Oral Daily  . aspirin  81 mg Oral Daily  . atorvastatin  40 mg Oral Daily  . calcium acetate  2,001 mg Oral TID WC  . carvedilol  3.125 mg Oral BID WC  . citalopram  10 mg Oral Daily  . doxercalciferol  2 mcg Intravenous Q T,Th,Sa-HD  . feeding supplement (NEPRO CARB STEADY)  237 mL Oral Q lunch  . feeding supplement (PRO-STAT SUGAR FREE 64)  30 mL Oral BID  . gabapentin  100 mg Oral BID  . heparin injection (subcutaneous)  5,000 Units Subcutaneous Q8H  . hydrALAZINE  10 mg Oral Q8H  . insulin aspart  0-5 Units Subcutaneous QHS  . insulin aspart  0-9 Units Subcutaneous TID WC  .  multivitamin  1 tablet Oral Daily  . pantoprazole  40 mg Oral Daily  . polyethylene glycol  17 g Oral QHS      LOS: 5 days   Estanislado Emms 11/14/2017,12:53 PM

## 2017-11-14 NOTE — Progress Notes (Signed)
PROGRESS NOTE    Deborah Hendricks  HDQ:[id number] DOB: 1946-05-29 DOA: 11/09/2017 PCP: [name], MD      Brief Narrative:  Deborah Hendricks is a 72 y.o.femalewith medical history significant ofESRD-on HD TTS, non compliance,hypertension, hyperlipidemia, diabetes mellitus, stroke, GERD, depression with anxiety, atrial fibrillation not on anticoagulants, OSA not on CPAP, PVD, critical lower limb ischemia,dCHF,s/p of L AKA,who presents with generalized weakness. Hyperkalemia on presentation K+ 6.0. BP 236/132.  Seizure like activity after dialysis yesterday 11/12/17. Transferred to stepdown unit. No reported recurrence. Remote hx of seizures with no recent treatment. Will have EEG done today. Neurology paged 11/12/17 and 11/13/17. Hx of non compliance to medications and medical management. Neurology will see her this am 11/13/17.     Assessment & Plan:  Principal Problem:   Hyperkalemia Active Problems:   Type II diabetes mellitus with neurological manifestations (HCC)   ESRD on dialysis (HCC)   Paroxysmal atrial fibrillation (HCC)   S/P AKA (above knee amputation) unilateral, left (HCC)   TIA (transient ischemic attack)   HLD (hyperlipidemia)   Hypertensive urgency   Hypertension   Hyperkalemia from missed dialysis Resolved  ESRD -Consult to Nephrology, appreciate cares -HD per nephrology -Continue PhosLo  Seizure Patient had seizure in 2016, now recurred.  EEG negative.  Neurology consulted, recommended 500 Keppra BID and outpaitent follow up -Keppra 500 BID -Continue seizures precautions  Diabetes Peripheral neuropathy -Continue gabapentin and Norco -SSI only  Atrial fibrillation CHADs2Vasc 8.  Patient anticoagulation stopped on Nov 20, per [facility].  Unclear why.  Patient does not know.   -Continue Coreg  Hypertension History of TIA/stroke  -Continue aspirin, statin -Continue amlodipine, hydralazine, Coreg  Other medications -Continue PPI -Continue  Celexa         DVT prophylaxis: Heparin Code Status: DO NOT RESUSCITATE Family Communication: None prsent Disposition Plan: To SNF tomorrow.   Consultants:   Neuro  Nephrology  Procedures:   EEG  Antimicrobials:   None    Subjective: Feels well. No more seizures.  Tongue hurts.  No confusion, fever, focal weaknses, numbness, slurred speech. No global weakness.  Objective: Vitals:   11/14/17 1445 11/14/17 1500 11/14/17 1515 11/14/17 1530  BP: (!) 96/52 (!) 103/49 (!) 104/52 (!) 89/52  Pulse: 67 67 67 67  Resp:      Temp:      TempSrc:      SpO2:      Weight:      Height:       No intake or output data in the 24 hours ending 11/14/17 1608 Filed Weights   11/13/17 0430 11/14/17 0303 11/14/17 1220  Weight: 71 kg (156 lb 8.4 oz) 69.7 kg (153 lb 10.6 oz) 70.4 kg (155 lb 3.3 oz)    Examination: General appearance: Elderly adult female, alert and in no acute distress.  On dialysis machine. HEENT: Anicteric, conjunctiva pink, lids and lashes normal. No nasal deformity, discharge, epistaxis.  Lips moist.   Skin: Warm and dry.   No suspicious rashes or lesions. Cardiac: RRR, nl J1-H4, systolic murmur appreciated.  Capillary refill is brisk.  JVP normal.  No LE edema.  Respiratory: Normal respiratory rate and rhythm.  CTAB without rales or wheezes. Abdomen: Abdomen soft.  No TTP. No ascites, distension, hepatosplenomegaly.   MSK: No deformities or effusions. Neuro: Awake and alert.  EOMI, moves all extremities. Speech fluent.    Psych: Sensorium intact and responding to questions, attention normal. Affect normal.  Judgment and insight appear normal.  Data Reviewed: I have personally reviewed following labs and imaging studies:  CBC: Recent Labs  Lab 11/09/17 1832 11/09/17 1852 11/10/17 0753 11/11/17 1502 11/13/17 0242 11/14/17 1300  WBC 5.8  --  5.6 6.0 12.9* 9.0  HGB 13.4 15.0 13.6 13.3 12.8 11.7*  HCT 41.2 44.0 42.0 40.1 39.9 37.6  MCV 88.0  --   87.5 87.6 89.5 91.0  PLT 210  --  224 197 150 673   Basic Metabolic Panel: Recent Labs  Lab 11/10/17 0753 11/11/17 1502 11/12/17 1000 11/12/17 1748 11/13/17 0242 11/14/17 1230  NA 138 134*  --  135 135 136  K 6.2* 5.6* 4.5 3.7 3.7 5.0  CL 102 98*  --  92* 99* 98*  CO2 19* 23  --  25 27 29   GLUCOSE 107* 182*  --  167* 104* 198*  BUN 144* 80*  --  21* 26* 48*  CREATININE 11.60* 8.90*  --  3.93* 4.68* 6.37*  CALCIUM 8.7* 9.0  --  9.2 9.1 8.6*  PHOS 6.2* 5.9*  --   --   --  5.0*   GFR: Estimated Creatinine Clearance: 7.9 mL/min (A) (by C-G formula based on SCr of 6.37 mg/dL (H)). Liver Function Tests: Recent Labs  Lab 11/09/17 1832 11/10/17 0753 11/11/17 1502 11/14/17 1230  AST 10*  --   --   --   ALT 19  --   --   --   ALKPHOS 159*  --   --   --   BILITOT 0.6  --   --   --   PROT 7.0  --   --   --   ALBUMIN 3.5 3.3* 3.2* 2.8*   No results for input(s): LIPASE, AMYLASE in the last 168 hours. No results for input(s): AMMONIA in the last 168 hours. Coagulation Profile: No results for input(s): INR, PROTIME in the last 168 hours. Cardiac Enzymes: No results for input(s): CKTOTAL, CKMB, CKMBINDEX, TROPONINI in the last 168 hours. BNP (last 3 results) No results for input(s): PROBNP in the last 8760 hours. HbA1C: Recent Labs    11/13/17 0242  HGBA1C 6.7*   CBG: Recent Labs  Lab 11/13/17 0806 11/13/17 1241 11/13/17 1637 11/13/17 2103 11/14/17 0810  GLUCAP 94 96 178* 272* 96   Lipid Profile: No results for input(s): CHOL, HDL, LDLCALC, TRIG, CHOLHDL, LDLDIRECT in the last 72 hours. Thyroid Function Tests: No results for input(s): TSH, T4TOTAL, FREET4, T3FREE, THYROIDAB in the last 72 hours. Anemia Panel: No results for input(s): VITAMINB12, FOLATE, FERRITIN, TIBC, IRON, RETICCTPCT in the last 72 hours. Urine analysis:    Component Value Date/Time   COLORURINE YELLOW 01/28/2014 1129   APPEARANCEUR CLEAR 01/28/2014 1129   LABSPEC 1.012 01/28/2014 1129    PHURINE 6.0 01/28/2014 1129   GLUCOSEU NEGATIVE 01/28/2014 1129   HGBUR NEGATIVE 01/28/2014 1129   BILIRUBINUR NEGATIVE 01/28/2014 1129   KETONESUR NEGATIVE 01/28/2014 1129   PROTEINUR 100 (A) 01/28/2014 1129   UROBILINOGEN 0.2 01/28/2014 1129   NITRITE NEGATIVE 01/28/2014 1129   LEUKOCYTESUR TRACE (A) 01/28/2014 1129   Sepsis Labs: @LABRCNTIP (procalcitonin:4,lacticacidven:4)  ) Recent Results (from the past 240 hour(s))  MRSA PCR Screening     Status: None   Collection Time: 11/13/17  8:26 PM  Result Value Ref Range Status   MRSA by PCR NEGATIVE NEGATIVE Final    Comment:        The GeneXpert MRSA Assay (FDA approved for NASAL specimens only), is one component of a comprehensive MRSA colonization  surveillance program. It is not intended to diagnose MRSA infection nor to guide or monitor treatment for MRSA infections.          Radiology Studies: Ct Head Wo Contrast  Result Date: 11/12/2017 CLINICAL DATA:  First time seizure activity during dialysis lasting for 3 minutes. EXAM: CT HEAD WITHOUT CONTRAST TECHNIQUE: Contiguous axial images were obtained from the base of the skull through the vertex without intravenous contrast. COMPARISON:  CT from 19 seen 3232 FINDINGS: Brain: Bilateral basal ganglial lacunar infarcts with periventricular chronic small vessel ischemic disease. No large vascular territory infarction, intra-axial mass nor extra-axial fluid collections. No hydrocephalus. Vascular: Moderate atherosclerosis of the carotid siphons. No hyperdense vessels. Skull: No skull fracture Sinuses/Orbits: No acute finding.  Bilateral lens replacements. Other: None IMPRESSION: 1. No acute intracranial abnormality.  No skull fracture. 2. Chronic small vessel ischemic disease of periventricular white matter. 3. Chronic bilateral basal ganglial lacunar infarcts, right greater than left. Electronically Signed   By: Ashley Royalty M.D.   On: 11/12/2017 17:06        Scheduled Meds: .  amLODipine  10 mg Oral Daily  . aspirin  81 mg Oral Daily  . atorvastatin  40 mg Oral Daily  . calcium acetate  2,001 mg Oral TID WC  . carvedilol  3.125 mg Oral BID WC  . citalopram  10 mg Oral Daily  . doxercalciferol  2 mcg Intravenous Q T,Th,Sa-HD  . feeding supplement (NEPRO CARB STEADY)  237 mL Oral Q lunch  . feeding supplement (PRO-STAT SUGAR FREE 64)  30 mL Oral BID  . gabapentin  100 mg Oral BID  . heparin injection (subcutaneous)  5,000 Units Subcutaneous Q8H  . hydrALAZINE  10 mg Oral Q8H  . insulin aspart  0-5 Units Subcutaneous QHS  . insulin aspart  0-9 Units Subcutaneous TID WC  . multivitamin  1 tablet Oral Daily  . pantoprazole  40 mg Oral Daily  . polyethylene glycol  17 g Oral QHS   Continuous Infusions: . levETIRAcetam Stopped (11/14/17 1133)     LOS: 5 days    Time spent: 40 minutes    Edwin Dada, MD [facility] 11/14/2017, 4:08 PM     Pager [phone] --- please page though AMION:  [email or web address] Password [email or web address] If 7PM-7AM, please contact night-coverage

## 2017-11-15 DIAGNOSIS — G459 Transient cerebral ischemic attack, unspecified: Secondary | ICD-10-CM

## 2017-11-15 DIAGNOSIS — I160 Hypertensive urgency: Secondary | ICD-10-CM

## 2017-11-15 LAB — GLUCOSE, CAPILLARY
GLUCOSE-CAPILLARY: 156 mg/dL — AB (ref 65–99)
Glucose-Capillary: 91 mg/dL (ref 65–99)

## 2017-11-15 MED ORDER — SENNOSIDES-DOCUSATE SODIUM 8.6-50 MG PO TABS: 1.0000 | ORAL_TABLET | Freq: Every evening | ORAL | Status: DC | PRN

## 2017-11-15 MED ORDER — HYDRALAZINE HCL 10 MG PO TABS: 10.0000 mg | ORAL_TABLET | Freq: Three times a day (TID) | ORAL | 0 refills | Status: AC

## 2017-11-15 MED ORDER — AMLODIPINE BESYLATE 10 MG PO TABS: 10.0000 mg | ORAL_TABLET | Freq: Every day | ORAL | 0 refills | Status: AC

## 2017-11-15 MED ORDER — LEVETIRACETAM 500 MG PO TABS: 500.0000 mg | ORAL_TABLET | Freq: Two times a day (BID) | ORAL | 0 refills | Status: AC

## 2017-11-15 MED ORDER — GABAPENTIN 100 MG PO CAPS
100.0000 mg | ORAL_CAPSULE | Freq: Two times a day (BID) | ORAL | Status: DC
  Administered 2017-11-15: 100 mg via ORAL
  Filled 2017-11-15: qty 1

## 2017-11-15 NOTE — Progress Notes (Signed)
Assessment/ Plan:  1. Seizure- now on Keppra per Neuro  2. Non-compliance with HD: Mild uremia/significant azotemia on after missing 4 Txs 3. Hyperkalemia: resolved  4. ESRD:TTS schedule.Due Sat. Stable from renal perspective 5.   HTN/volume:Need higher EDW at discharge  Dialysis Orders: TTS at [place], BFR 400, DFR 800, EDW 65.5kg, 2K/2Ca, Linear Na, Prof 4, heparin 5K bolus - Hectoral 55mcg IV q HD  Subjective: Interval History: Ok  Objective: Vital signs in last 24 hours: Temp:  [97.6 F (36.4 C)-98.8 F (37.1 C)] 98.8 F (37.1 C) (01/04 0754) Pulse Rate:  [61-71] 70 (01/04 0754) Resp:  [13-18] 16 (01/04 0754) BP: (78-124)/(40-67) 124/57 (01/04 0754) SpO2:  [95 %-97 %] 97 % (01/04 0754) Weight:  [70 kg (154 lb 5.2 oz)-71.2 kg (156 lb 15.5 oz)] 71.2 kg (156 lb 15.5 oz) (01/04 0316) Weight change: 0.7 kg (1 lb 8.7 oz)  Intake/Output from previous day: 01/03 0701 - 01/04 0700 In: 330 [P.O.:330] Out: 332  Intake/Output this shift: No intake/output data recorded.  General appearance: alert and cooperative Resp: clear to auscultation bilaterally Chest wall: no tenderness Cardio: regular rate and rhythm, S1, S2 normal, no murmur, click, rub or gallop  Lab Results: Recent Labs    11/13/17 0242 11/14/17 1300  WBC 12.9* 9.0  HGB 12.8 11.7*  HCT 39.9 37.6  PLT 150 151   BMET:  Recent Labs    11/13/17 0242 11/14/17 1230  NA 135 136  K 3.7 5.0  CL 99* 98*  CO2 27 29  GLUCOSE 104* 198*  BUN 26* 48*  CREATININE 4.68* 6.37*  CALCIUM 9.1 8.6*   No results for input(s): PTH in the last 72 hours. Iron Studies: No results for input(s): IRON, TIBC, TRANSFERRIN, FERRITIN in the last 72 hours. Studies/Results: No results found.  Scheduled: . amLODipine  10 mg Oral Daily  . aspirin  81 mg Oral Daily  . atorvastatin  40 mg Oral Daily  . calcium acetate  2,001 mg Oral TID WC  . carvedilol  3.125 mg Oral BID WC  . citalopram  10 mg Oral Daily  .  doxercalciferol  2 mcg Intravenous Q T,Th,Sa-HD  . feeding supplement (NEPRO CARB STEADY)  237 mL Oral Q lunch  . feeding supplement (PRO-STAT SUGAR FREE 64)  30 mL Oral BID  . gabapentin  100 mg Oral BID  . heparin injection (subcutaneous)  5,000 Units Subcutaneous Q8H  . hydrALAZINE  10 mg Oral Q8H  . insulin aspart  0-5 Units Subcutaneous QHS  . insulin aspart  0-9 Units Subcutaneous TID WC  . levETIRAcetam  500 mg Oral BID  . multivitamin  1 tablet Oral Daily  . pantoprazole  40 mg Oral Daily  . polyethylene glycol  17 g Oral QHS     LOS: 6 days   Estanislado Emms 11/15/2017,10:11 AM

## 2017-11-15 NOTE — Discharge Summary (Signed)
Physician Discharge Summary  Deborah Hendricks HGD:[id number] DOB: 01-29-1946 DOA: 11/09/2017  PCP: [name], MD  Admit date: 11/09/2017 Discharge date: 11/15/2017  Admitted From: SNF  Disposition:  SNF   Recommendations for Outpatient Follow-up:  1. Please arrange appointment with [facility] Neurology or [facility] Neurology in 3-4 weeks  Home Health: None  Equipment/Devices: None  Discharge Condition: Improved, weak overall  CODE STATUS: DO NOT RESUSCITATE Diet recommendation: Diabetic, renal  Brief/Interim Summary: [name] is a 72 y.o.femalewith medical history significant ofESRD-on HD TTS, non compliance,hypertension, hyperlipidemia, diabetes mellitus, stroke, GERD, depression with anxiety, atrial fibrillation not on anticoagulants, OSA not on CPAP, PVD, critical lower limb ischemia,dCHF,s/p of L AKA,who presents with generalized weakness. Hyperkalemia on presentation K+ 6.0 and hypertensive urgency BP 236/132.    Hyperkalemia from missed dialysis Resolved with dialysis  ESRD  Evaluated by Nephrology, treated with consecutive dialysis  Seizure The patient previously reports a seizure in 2016, but was not on AEDs.  During this hospitalization, she had a seizure episode, was evaluated by Neurology, EEG negative, but suspected to have had a seizure and was started on Keppra.  Will have Neurology follow up outpatient.  Atrial fibrillation The patient has history of stroke/TIA and has CHADS2Vasc >8.  Should be on anticoagulation.  This should be readdressed by her outpatient provider and started on warfarin if able.      Discharge Diagnoses:  Principal Problem:   Hyperkalemia Active Problems:   Type II diabetes mellitus with neurological manifestations (HCC)   ESRD on dialysis (HCC)   Paroxysmal atrial fibrillation (HCC)   S/P AKA (above knee amputation) unilateral, left (HCC)   TIA (transient ischemic attack)   HLD (hyperlipidemia)   Hypertensive urgency    Hypertension    Discharge Instructions  Discharge Instructions    Diet Carb Modified   Complete by:  As directed    Discharge instructions   Complete by:  As directed    From Dr. Nelva Bush were admitted to the hospital for high potassium levels and high blood urea nitrogen from missed dialysis.  This was resolved with dialysis.  While in the hospital, you had another seizure, and so because this was a second seizure, our neurologist (brain specialist) recommended to start anti-seizure medicine.  Start taking Levetiracetam/Keppra 500 mg twice daily  Have your nursing home call to make an appointment for you with [facility] Neurology or [facility] Neurology in the next month to discuss your medications and any side effects from them.  There have been some adjustments to your medicines for blood pressure.  In particular: Start taking amlodipine 10 mg daily Start taking hydralazine 10 mg three times daily  Ask your doctor why your apixaban was stopped, as this should probably be continued.   Increase activity slowly   Complete by:  As directed      Allergies as of 11/15/2017      Reactions   Penicillins Hives, Itching, Rash, Other (See Comments)   Has patient had a PCN reaction causing immediate rash, facial/tongue/throat swelling, SOB or lightheadedness with hypotension: Yes Has patient had a PCN reaction causing severe rash involving mucus membranes or skin necrosis:unknown MAR source Has patient had a PCN reaction that required hospitalization:unknown MAR source Has patient had a PCN reaction occurring within the last 10 years:unknown MAR source If all of the above answers are "NO", then may proceed with Cephalosporin use.   Sulfa Antibiotics    UNSPECIFIED REACTION    Amoxicillin Hives, Itching, Rash  Has patient had a PCN reaction causing immediate rash, facial/tongue/throat swelling, SOB or lightheadedness with hypotension:unknown MAR source Has patient had a PCN reaction  causing severe rash involving mucus membranes or skin necrosis:unknown MAR source Has patient had a PCN reaction that required hospitalization:unknown MAR source Has patient had a PCN reaction occurring within the last 10 years: unknown MAR source If all of the above answers are "NO", then may proceed with Cephalosporin use.   Latex Rash      Medication List    STOP taking these medications   apixaban 2.5 MG Tabs tablet Commonly known as:  ELIQUIS     TAKE these medications   amLODipine 10 MG tablet Commonly known as:  NORVASC Take 1 tablet (10 mg total) by mouth daily. Start taking on:  11/16/2017   aspirin EC 81 MG tablet Take 81 mg by mouth daily.   atorvastatin 40 MG tablet Commonly known as:  LIPITOR Take 1 tablet (40 mg total) by mouth daily.   calcium acetate 667 MG capsule Commonly known as:  PHOSLO Take 2,001 mg by mouth 3 (three) times daily with meals.   carvedilol 3.125 MG tablet Commonly known as:  COREG Take 1 tablet (3.125 mg total) by mouth 2 (two) times daily with a meal.   citalopram 10 MG tablet Commonly known as:  CELEXA Take 10 mg by mouth daily. (0900)   esomeprazole 20 MG capsule Commonly known as:  NEXIUM Take 1 capsule (20 mg total) by mouth daily.   feeding supplement (NEPRO CARB STEADY) Liqd Take 237 mLs by mouth daily with lunch.   feeding supplement (PRO-STAT SUGAR FREE 64) Liqd Take 30 mLs by mouth 2 (two) times daily. (0900 & 2100)   gabapentin 100 MG capsule Commonly known as:  NEURONTIN Take 1 capsule (100 mg total) by mouth at bedtime.   hydrALAZINE 10 MG tablet Commonly known as:  APRESOLINE Take 1 tablet (10 mg total) by mouth every 8 (eight) hours.   insulin aspart 100 UNIT/ML injection Commonly known as:  novoLOG Inject 2-10 Units into the skin 3 (three) times daily as needed for high blood sugar. 151-200=2 units, 201-250=4 units, 251-300=6 units, 301-350=8 units, 351-400=10 units, >400=10 units and Call MD    levETIRAcetam 500 MG tablet Commonly known as:  KEPPRA Take 1 tablet (500 mg total) by mouth 2 (two) times daily.   multivitamin Tabs tablet Take 1 tablet by mouth daily. (0900)   ondansetron 4 MG disintegrating tablet Commonly known as:  ZOFRAN ODT Take 1 tablet (4 mg total) by mouth every 8 (eight) hours as needed for nausea or vomiting.   polyethylene glycol powder powder Commonly known as:  GLYCOLAX/MIRALAX Take 17 g by mouth at bedtime. HOLD FOR LOOSE STOOLS   psyllium 0.52 g capsule Commonly known as:  REGULOID One po tid prn constipation. What changed:    how much to take  how to take this  when to take this  reasons to take this  additional instructions       Allergies  Allergen Reactions  . Penicillins Hives, Itching, Rash and Other (See Comments)    Has patient had a PCN reaction causing immediate rash, facial/tongue/throat swelling, SOB or lightheadedness with hypotension: Yes Has patient had a PCN reaction causing severe rash involving mucus membranes or skin necrosis:unknown MAR source Has patient had a PCN reaction that required hospitalization:unknown MAR source Has patient had a PCN reaction occurring within the last 10 years:unknown MAR source If all of the  above answers are "NO", then may proceed with Cephalosporin use.  . Sulfa Antibiotics     UNSPECIFIED REACTION   . Amoxicillin Hives, Itching and Rash    Has patient had a PCN reaction causing immediate rash, facial/tongue/throat swelling, SOB or lightheadedness with hypotension:unknown MAR source Has patient had a PCN reaction causing severe rash involving mucus membranes or skin necrosis:unknown MAR source Has patient had a PCN reaction that required hospitalization:unknown MAR source Has patient had a PCN reaction occurring within the last 10 years: unknown MAR source If all of the above answers are "NO", then may proceed with Cephalosporin use.   . Latex Rash     Consultations:  Nephrology  Neurology     Procedures/Studies: Ct Head Wo Contrast  Result Date: 11/12/2017 CLINICAL DATA:  First time seizure activity during dialysis lasting for 3 minutes. EXAM: CT HEAD WITHOUT CONTRAST TECHNIQUE: Contiguous axial images were obtained from the base of the skull through the vertex without intravenous contrast. COMPARISON:  CT from 19 seen 3232 FINDINGS: Brain: Bilateral basal ganglial lacunar infarcts with periventricular chronic small vessel ischemic disease. No large vascular territory infarction, intra-axial mass nor extra-axial fluid collections. No hydrocephalus. Vascular: Moderate atherosclerosis of the carotid siphons. No hyperdense vessels. Skull: No skull fracture Sinuses/Orbits: No acute finding.  Bilateral lens replacements. Other: None IMPRESSION: 1. No acute intracranial abnormality.  No skull fracture. 2. Chronic small vessel ischemic disease of periventricular white matter. 3. Chronic bilateral basal ganglial lacunar infarcts, right greater than left. Electronically Signed   By: Ashley Royalty M.D.   On: 11/12/2017 17:06   Dg Chest Port 1 View  Result Date: 11/09/2017 CLINICAL DATA:  Fatigue and weakness, history of missed dialysis treatments EXAM: PORTABLE CHEST 1 VIEW COMPARISON:  03/26/2017 FINDINGS: Cardiac shadow is mildly enlarged. Aortic calcifications are again seen and stable. Femoral dialysis catheter is noted extending into the right atrium in satisfactory position. The lungs are well aerated with small right-sided pleural effusion new from the prior exam. No significant vascular congestion is noted. No bony abnormality is noted. IMPRESSION: New small right-sided pleural effusion. No other focal abnormality is noted. Electronically Signed   By: Inez Catalina M.D.   On: 11/09/2017 18:28       Subjective: Feels okay, no new Change.  No chest pain, leg pain, nausea, weakness, vomiting.   Discharge Exam: Vitals:   11/15/17 0754  11/15/17 1110  BP: (!) 124/57 125/61  Pulse: 70 70  Resp: 16 15  Temp: 98.8 F (37.1 C) 98.1 F (36.7 C)  SpO2: 97% 95%   Vitals:   11/14/17 2318 11/15/17 0316 11/15/17 0754 11/15/17 1110  BP: (!) 117/52 121/67 (!) 124/57 125/61  Pulse: 68 69 70 70  Resp: 16 17 16 15   Temp: 97.9 F (36.6 C) 98 F (36.7 C) 98.8 F (37.1 C) 98.1 F (36.7 C)  TempSrc: Oral Oral Oral Oral  SpO2: 95% 96% 97% 95%  Weight:  71.2 kg (156 lb 15.5 oz)    Height:        General: Pt is alert, awake, not in acute distress Cardiovascular: RRR, R4/W5 +, soft systolic mrumur noted. no rubs, no gallops Respiratory: CTA bilaterally, no wheezing, no rhonchi Abdominal: Soft, NT, ND, bowel sounds + Extremities: no edema, no cyanosis    The results of significant diagnostics from this hospitalization (including imaging, microbiology, ancillary and laboratory) are listed below for reference.     Microbiology: Recent Results (from the past 240 hour(s))  MRSA PCR  Screening     Status: None   Collection Time: 11/13/17  8:26 PM  Result Value Ref Range Status   MRSA by PCR NEGATIVE NEGATIVE Final    Comment:        The GeneXpert MRSA Assay (FDA approved for NASAL specimens only), is one component of a comprehensive MRSA colonization surveillance program. It is not intended to diagnose MRSA infection nor to guide or monitor treatment for MRSA infections.      Labs: BNP (last 3 results) No results for input(s): BNP in the last 8760 hours. Basic Metabolic Panel: Recent Labs  Lab 11/10/17 0753 11/11/17 1502 11/12/17 1000 11/12/17 1748 11/13/17 0242 11/14/17 1230  NA 138 134*  --  135 135 136  K 6.2* 5.6* 4.5 3.7 3.7 5.0  CL 102 98*  --  92* 99* 98*  CO2 19* 23  --  25 27 29   GLUCOSE 107* 182*  --  167* 104* 198*  BUN 144* 80*  --  21* 26* 48*  CREATININE 11.60* 8.90*  --  3.93* 4.68* 6.37*  CALCIUM 8.7* 9.0  --  9.2 9.1 8.6*  PHOS 6.2* 5.9*  --   --   --  5.0*   Liver Function  Tests: Recent Labs  Lab 11/09/17 1832 11/10/17 0753 11/11/17 1502 11/14/17 1230  AST 10*  --   --   --   ALT 19  --   --   --   ALKPHOS 159*  --   --   --   BILITOT 0.6  --   --   --   PROT 7.0  --   --   --   ALBUMIN 3.5 3.3* 3.2* 2.8*   No results for input(s): LIPASE, AMYLASE in the last 168 hours. No results for input(s): AMMONIA in the last 168 hours. CBC: Recent Labs  Lab 11/09/17 1832 11/09/17 1852 11/10/17 0753 11/11/17 1502 11/13/17 0242 11/14/17 1300  WBC 5.8  --  5.6 6.0 12.9* 9.0  HGB 13.4 15.0 13.6 13.3 12.8 11.7*  HCT 41.2 44.0 42.0 40.1 39.9 37.6  MCV 88.0  --  87.5 87.6 89.5 91.0  PLT 210  --  224 197 150 151   Cardiac Enzymes: No results for input(s): CKTOTAL, CKMB, CKMBINDEX, TROPONINI in the last 168 hours. BNP: Invalid input(s): POCBNP CBG: Recent Labs  Lab 11/13/17 2103 11/14/17 0810 11/14/17 1736 11/14/17 2119 11/15/17 0755  GLUCAP 272* 96 123* 153* 91   D-Dimer No results for input(s): DDIMER in the last 72 hours. Hgb A1c Recent Labs    11/13/17 0242  HGBA1C 6.7*   Lipid Profile No results for input(s): CHOL, HDL, LDLCALC, TRIG, CHOLHDL, LDLDIRECT in the last 72 hours. Thyroid function studies No results for input(s): TSH, T4TOTAL, T3FREE, THYROIDAB in the last 72 hours.  Invalid input(s): FREET3 Anemia work up No results for input(s): VITAMINB12, FOLATE, FERRITIN, TIBC, IRON, RETICCTPCT in the last 72 hours. Urinalysis    Component Value Date/Time   COLORURINE YELLOW 01/28/2014 1129   APPEARANCEUR CLEAR 01/28/2014 1129   LABSPEC 1.012 01/28/2014 1129   PHURINE 6.0 01/28/2014 1129   GLUCOSEU NEGATIVE 01/28/2014 1129   HGBUR NEGATIVE 01/28/2014 1129   BILIRUBINUR NEGATIVE 01/28/2014 1129   [place] 01/28/2014 1129   PROTEINUR 100 (A) 01/28/2014 1129   UROBILINOGEN 0.2 01/28/2014 1129   NITRITE NEGATIVE 01/28/2014 1129   LEUKOCYTESUR TRACE (A) 01/28/2014 1129   Sepsis Labs Invalid input(s): PROCALCITONIN,   WBC,  LACTICIDVEN Microbiology Recent Results (from  the past 240 hour(s))  MRSA PCR Screening     Status: None   Collection Time: 11/13/17  8:26 PM  Result Value Ref Range Status   MRSA by PCR NEGATIVE NEGATIVE Final    Comment:        The GeneXpert MRSA Assay (FDA approved for NASAL specimens only), is one component of a comprehensive MRSA colonization surveillance program. It is not intended to diagnose MRSA infection nor to guide or monitor treatment for MRSA infections.      Time coordinating discharge: More than 30 minutes  SIGNED:   Edwin Dada, MD  [facility] 11/15/2017, 11:24 AM

## 2017-11-15 NOTE — Clinical Social Work Note (Signed)
Clinical Social Work Assessment  Patient Details  Name: Deborah Hendricks MRN: [id number] Date of Birth: 1946-01-22  Date of referral:  11/15/17               Reason for consult:  Facility Placement, Discharge Planning                Permission sought to share information with:  Facility Sport and exercise psychologist, Family Supports Permission granted to share information::  Yes, Verbal Permission Granted  Name::     [place]::  [facility]  Relationship::  Daughter  Contact Information:  [phone]  Housing/Transportation Living arrangements for the past 2 months:  [place] of Information:  Patient, Medical Team, Adult Children Patient Interpreter Needed:  None Criminal Activity/Legal Involvement Pertinent to Current Situation/Hospitalization:  No - Comment as needed Significant Relationships:  Adult Children Lives with:  Facility Resident Do you feel safe going back to the place where you live?  Yes Need for family participation in patient care:  Yes (Comment)  Care giving concerns:  Patient is a long-term resident at [facility].   Social Worker assessment / plan:  CSW met with patient. No supports at bedside. CSW introduced role and explained that discharge planning would be discussed. Patient confirmed she was admitted from [facility]. She has been there for 6+ months. Patient asking to be transferred to a facility in [place] where she is from. CSW explained that due to confidentiality issues, CSW cannot send referral to facilities not on our database. Patient then asked about [facility] but CSW explained the same. She gave CSW permission to contact her daughter, Ilda Basset. Ms. Berdine Addison lives in [place], [place]. She stated that patient was initially at [facility] in [place] but then her sister from [place] came and told the patient she could live with her. They were evicted and patient was subsequently placed at [facility]. This other  daughter has since moved to [place] and patient only has a 34 year old nephew that lives in the area. Ms. Berdine Addison hopes to have her transferred back to [facility] in [place], [place] but states she currently has a bill of $2000 that needs to be paid. Ms. Berdine Addison wants wants her to discharge to [facility] in the meantime. CSW discussed with patient and she is agreeable.  Employment status:  Retired Forensic scientist:  Medicare PT Recommendations:  [place] / Referral to community resources:  [place]  Patient/Family's Response to care:  Patient and her daughter agreeable to return to [facility]. Patient's daughter supportive and involved in patient's care. Patient and her daughter appreciated social work intervention.  Patient/Family's Understanding of and Emotional Response to Diagnosis, Current Treatment, and Prognosis:  Patient and her daughter have a good understanding of the reason for admission and her need to return to SNF. Patient and her daughter appear happy with hospital care.  Emotional Assessment Appearance:  Appears stated age Attitude/Demeanor/Rapport:  Engaged, Gracious Affect (typically observed):  Accepting, Appropriate, Calm, Pleasant Orientation:  Oriented to Self, Oriented to Place, Oriented to  Time, Oriented to Situation Alcohol / Substance use:  Never Used Psych involvement (Current and /or in the community):  No (Comment)  Discharge Needs  Concerns to be addressed:  Care Coordination Readmission within the last 30 days:  No Current discharge risk:  Dependent with Mobility Barriers to Discharge:  No Barriers Identified   Candie Chroman, LCSW 11/15/2017, 9:34  AM

## 2017-11-15 NOTE — Clinical Social Work Note (Signed)
CSW facilitated patient discharge including contacting patient family (daughter: Crystal) and facility to confirm patient discharge plans. Clinical information faxed to facility and family agreeable with plan. CSW arranged ambulance transport via [facility] to [facility] around 1:30 pm. RN to call report prior to discharge [phone]).  CSW will sign off for now as social work intervention is no longer needed. Please consult [place] again if new needs arise.  [place] Scrape, [place]

## 2017-11-15 NOTE — NC FL2 (Signed)
[facility] LEVEL OF CARE SCREENING TOOL     IDENTIFICATION  Patient Name: Deborah Hendricks Birthdate: 08-26-1946 Sex: female Admission Date (Current Location): 11/09/2017  [facility] and [place] Number:  Herbalist and Address:  The [place], [place] [address] [place], [place] [address]      Provider Number: [id number]  Attending Physician Name and Address:  Edwin Dada, *  Relative Name and Phone Number:       Current Level of Care: Hospital Recommended Level of Care: [place] Prior Approval Number:    Date Approved/Denied:   PASRR Number: [id number]  Discharge Plan: SNF    Current Diagnoses: Patient Active Problem List   Diagnosis Date Noted  . Hypertension   . Hypertensive urgency 11/09/2017  . Ischemia of right lower extremity 06/14/2017  . Hyperkalemia 06/05/2017  . Bleeding due to dialysis catheter placement ([place]) 06/05/2017  . History of hysterectomy 03/09/2017  . Former smoker 03/09/2017  . Dysphagia 03/09/2017  . Carotid stenosis 03/09/2017  . Need for assistance due to reduced mobility 03/09/2017  . Complication of vascular access for dialysis 03/09/2017  . Anemia in chronic kidney disease (CKD) 03/09/2017  . Sleep apnea   . Refusal of blood transfusions as patient is Jehovah's Witness   . Constipation   . GERD (gastroesophageal reflux disease)   . HLD (hyperlipidemia)   . TIA (transient ischemic attack) 02/01/2017  . Diabetic ulcer of toe of right foot associated with diabetes mellitus due to underlying condition, limited to breakdown of skin ([place])   . PVD (peripheral vascular disease) ([place]) 11/27/2016  . Blurry vision   . Asymmetric SNHL (sensorineural hearing loss) 06/12/2016  . Peripheral neuropathy 06/11/2016  . History of pulmonary embolism 06/08/2016  . History of CVA with residual deficit 06/08/2016  . History of borderline personality disorder 04/19/2016  . History of  medication noncompliance 04/19/2016  . Paroxysmal atrial fibrillation ([place]) 04/19/2016  . Seizure disorder ([place]) 04/19/2016  . Renovascular hypertension 04/13/2016  . ESRD on dialysis ([place]) 04/11/2016  . Generalized anxiety disorder 04/11/2016  . Major depression, recurrent, chronic ([place]) 04/11/2016  . S/P AKA (above knee amputation) unilateral, left ([place]) 04/11/2016  . PAD (peripheral artery disease) ([place]) 12/15/2015  . Type II diabetes mellitus with neurological manifestations ([place]) 10/21/2015  . Seizures ([place]) 04/13/2015    Orientation RESPIRATION BLADDER Height & Weight     Self, Time, Situation, Place  Normal Incontinent Weight: 156 lb 15.5 oz (71.2 kg) Height:  5\' 5"  (165.1 cm)  BEHAVIORAL SYMPTOMS/MOOD NEUROLOGICAL BOWEL NUTRITION STATUS  (None) Convulsions/Seizures Continent Diet(Full liquid. Carb modified.)  AMBULATORY STATUS COMMUNICATION OF NEEDS Skin   Extensive Assist Verbally Bruising                       Personal Care Assistance Level of Assistance              Functional Limitations Info  Sight, Hearing, Speech Sight Info: Adequate Hearing Info: Adequate Speech Info: Adequate    SPECIAL CARE FACTORS FREQUENCY  Blood pressure, Speech therapy, PT (By licensed PT)     PT Frequency: 5 x week       Speech Therapy Frequency: 5 x week      Contractures Contractures Info: Not present    Additional Factors Info  Code Status, Allergies, Psychotropic Code Status Info: DNR Allergies Info: Penicillins, Sulfa Antibiotics, Amoxicillin, Latex Psychotropic Info: Anxiety, Depression, Borderline: Celexa 10 mg PO  daily         Current Medications (11/15/2017):  This is the current hospital active medication list Current Facility-Administered Medications  Medication Dose Route Frequency Provider Last Rate Last Dose  . acetaminophen (TYLENOL) tablet 650 mg  650 mg Oral Q6H PRN Irene Pap N, DO      . amLODipine (NORVASC) tablet 10 mg  10 mg Oral Daily  Ivor Costa, MD   10 mg at 11/14/17 0858  . aspirin chewable tablet 81 mg  81 mg Oral Daily Irene Pap N, DO   81 mg at 11/14/17 0857  . atorvastatin (LIPITOR) tablet 40 mg  40 mg Oral Daily Ivor Costa, MD   40 mg at 11/14/17 0856  . calcium acetate (PHOSLO) capsule 2,001 mg  2,001 mg Oral TID WC Ivor Costa, MD   2,001 mg at 11/15/17 0754  . carvedilol (COREG) tablet 3.125 mg  3.125 mg Oral BID WC Ivor Costa, MD   3.125 mg at 11/15/17 0754  . citalopram (CELEXA) tablet 10 mg  10 mg Oral Daily Ivor Costa, MD   10 mg at 11/14/17 0856  . doxercalciferol (HECTOROL) injection 2 mcg  2 mcg Intravenous Q T,Th,Sa-HD Loren Racer, PA-C   2 mcg at 11/14/17 1517  . feeding supplement (NEPRO CARB STEADY) liquid 237 mL  237 mL Oral Q lunch Ivor Costa, MD   237 mL at 11/13/17 1450  . feeding supplement (PRO-STAT SUGAR FREE 64) liquid 30 mL  30 mL Oral BID Ivor Costa, MD   30 mL at 11/13/17 2211  . gabapentin (NEURONTIN) 250 MG/5ML solution 100 mg  100 mg Oral BID Irene Pap N, DO   100 mg at 11/14/17 2200  . heparin injection 5,000 Units  5,000 Units Subcutaneous Q8H Ivor Costa, MD   5,000 Units at 11/15/17 0550  . hydrALAZINE (APRESOLINE) injection 5 mg  5 mg Intravenous Q2H PRN Ivor Costa, MD   5 mg at 11/10/17 1552  . hydrALAZINE (APRESOLINE) tablet 10 mg  10 mg Oral Q8H Hall, Carole N, DO   10 mg at 11/15/17 0550  . HYDROcodone-acetaminophen (NORCO/VICODIN) 5-325 MG per tablet 1-2 tablet  1-2 tablet Oral Q6H PRN Vertis Kelch, NP   1 tablet at 11/14/17 0857  . insulin aspart (novoLOG) injection 0-5 Units  0-5 Units Subcutaneous QHS Ivor Costa, MD   3 Units at 11/13/17 2216  . insulin aspart (novoLOG) injection 0-9 Units  0-9 Units Subcutaneous TID WC Ivor Costa, MD   1 Units at 11/14/17 1742  . levETIRAcetam (KEPPRA) tablet 500 mg  500 mg Oral BID Edwin Dada, MD      . LORazepam (ATIVAN) injection 0.5 mg  0.5 mg Intravenous Q6H PRN Irene Pap N, DO      . multivitamin  (RENA-VIT) tablet 1 tablet  1 tablet Oral Daily Ivor Costa, MD   1 tablet at 11/14/17 0855  . ondansetron (ZOFRAN) injection 4 mg  4 mg Intravenous Q8H PRN Ivor Costa, MD   4 mg at 11/11/17 1748  . pantoprazole (PROTONIX) EC tablet 40 mg  40 mg Oral Daily Danford, Suann Larry, MD   40 mg at 11/14/17 1150  . polyethylene glycol (MIRALAX / GLYCOLAX) packet 17 g  17 g Oral QHS Ivor Costa, MD   17 g at 11/13/17 2211  . psyllium (HYDROCIL/METAMUCIL) packet 1 packet  1 packet Oral TID PRN Ivor Costa, MD      . senna-docusate (Senokot-S) tablet 1 tablet  1 tablet  Oral QHS PRN Ivor Costa, MD      . zolpidem Lorrin Mais) tablet 5 mg  5 mg Oral QHS PRN Ivor Costa, MD   5 mg at 11/09/17 2338     Discharge Medications: Please see discharge summary for a list of discharge medications.  Relevant Imaging Results:  Relevant Lab Results:   Additional Information SS#: [id number]. HD TTS at [facility].  Candie Chroman, LCSW

## 2017-11-15 NOTE — Progress Notes (Signed)
  Speech Language Pathology Treatment: Dysphagia  Patient Details Name: Deborah Hendricks MRN: [id number] DOB: [birth year] Today's Date: 11/15/2017 Time: 8127-5170 SLP Time Calculation (min) (ACUTE ONLY): 15 min  Assessment / Plan / Recommendation Clinical Impression  Pt looks brighter, speech somewhat less dysarthric and tongue less bruised. Educated and discussed options for food consistencies and answered questions. Regular texture will allow greater choices and pt can choose softer items (pt in agreement). Reviewed menu to select appropriate lunch and dinner choices and ordered lunch and dinner. RN states pt plans to return to SNF today- short term ST follow [facility] at [facility].   HPI HPI: Pt is a 72 y.o. female admitted from SNF on 11/09/17 with generalized weakness; found to have hyperkalemia in setting of ESRD non-compliant with HD. Pt with h/o non-compliance with medications and medical management with multiple admissions to [facility]. Of note, had seizure-like activity after HD on 11/12/17; EEG pending. CT showed chronic bilateral basal ganglial lacunar infarcts. Other PMH includes L AKA, DM, a-fib, TIA, CVA, HLD, HTN, peripheral neuropathy.MBS 01/2017 swallow delay, silent penetration before the swallow with thin, very slow esophageal clearance (full column of barium) and regular texture, nectar thick liquids recommended. Pt bit tongue during seizure and ST ordered.       SLP Plan  Continue with current plan of care       Recommendations  Diet recommendations: Regular;Thin liquid Liquids provided via: Cup;Straw Medication Administration: Whole meds with puree Supervision: Patient able to self feed Compensations: Small sips/bites;Slow rate Postural Changes and/or Swallow Maneuvers: Seated upright 90 degrees                Oral Care Recommendations: Oral care BID Follow up Recommendations: Skilled Nursing facility SLP Visit Diagnosis: Dysphagia, oral phase (R13.11) Plan: Continue with current  plan of care       GO                Houston Siren 11/15/2017, 11:50 AM  Orbie Pyo Colvin Caroli.Ed [employer] [phone]

## 2017-11-18 ENCOUNTER — Ambulatory Visit: Payer: Medicare Other | Admitting: Surgery

## 2017-11-19 DIAGNOSIS — E119 Type 2 diabetes mellitus without complications: Secondary | ICD-10-CM | POA: Diagnosis not present

## 2017-11-19 DIAGNOSIS — Z992 Dependence on renal dialysis: Secondary | ICD-10-CM | POA: Diagnosis not present

## 2017-11-19 DIAGNOSIS — N186 End stage renal disease: Secondary | ICD-10-CM | POA: Diagnosis not present

## 2017-11-19 DIAGNOSIS — N2581 Secondary hyperparathyroidism of renal origin: Secondary | ICD-10-CM | POA: Diagnosis not present

## 2017-11-20 DIAGNOSIS — I739 Peripheral vascular disease, unspecified: Secondary | ICD-10-CM | POA: Diagnosis not present

## 2017-11-20 DIAGNOSIS — E0822 Diabetes mellitus due to underlying condition with diabetic chronic kidney disease: Secondary | ICD-10-CM | POA: Diagnosis not present

## 2017-11-20 DIAGNOSIS — N186 End stage renal disease: Secondary | ICD-10-CM | POA: Diagnosis not present

## 2017-11-20 DIAGNOSIS — I4891 Unspecified atrial fibrillation: Secondary | ICD-10-CM | POA: Diagnosis not present

## 2017-11-20 DIAGNOSIS — G40909 Epilepsy, unspecified, not intractable, without status epilepticus: Secondary | ICD-10-CM | POA: Diagnosis not present

## 2017-11-20 DIAGNOSIS — I10 Essential (primary) hypertension: Secondary | ICD-10-CM | POA: Diagnosis not present

## 2017-11-21 DIAGNOSIS — F54 Psychological and behavioral factors associated with disorders or diseases classified elsewhere: Secondary | ICD-10-CM | POA: Diagnosis not present

## 2017-11-21 DIAGNOSIS — F3341 Major depressive disorder, recurrent, in partial remission: Secondary | ICD-10-CM | POA: Diagnosis not present

## 2017-11-21 DIAGNOSIS — E119 Type 2 diabetes mellitus without complications: Secondary | ICD-10-CM | POA: Diagnosis not present

## 2017-11-21 DIAGNOSIS — F411 Generalized anxiety disorder: Secondary | ICD-10-CM | POA: Diagnosis not present

## 2017-11-21 DIAGNOSIS — N186 End stage renal disease: Secondary | ICD-10-CM | POA: Diagnosis not present

## 2017-11-21 DIAGNOSIS — Z992 Dependence on renal dialysis: Secondary | ICD-10-CM | POA: Diagnosis not present

## 2017-11-21 DIAGNOSIS — F0391 Unspecified dementia with behavioral disturbance: Secondary | ICD-10-CM | POA: Diagnosis not present

## 2017-11-21 DIAGNOSIS — N2581 Secondary hyperparathyroidism of renal origin: Secondary | ICD-10-CM | POA: Diagnosis not present

## 2017-11-23 DIAGNOSIS — N2581 Secondary hyperparathyroidism of renal origin: Secondary | ICD-10-CM | POA: Diagnosis not present

## 2017-11-23 DIAGNOSIS — N186 End stage renal disease: Secondary | ICD-10-CM | POA: Diagnosis not present

## 2017-11-23 DIAGNOSIS — E119 Type 2 diabetes mellitus without complications: Secondary | ICD-10-CM | POA: Diagnosis not present

## 2017-11-23 DIAGNOSIS — Z992 Dependence on renal dialysis: Secondary | ICD-10-CM | POA: Diagnosis not present

## 2017-11-26 DIAGNOSIS — N186 End stage renal disease: Secondary | ICD-10-CM | POA: Diagnosis not present

## 2017-11-26 DIAGNOSIS — N2581 Secondary hyperparathyroidism of renal origin: Secondary | ICD-10-CM | POA: Diagnosis not present

## 2017-11-26 DIAGNOSIS — E119 Type 2 diabetes mellitus without complications: Secondary | ICD-10-CM | POA: Diagnosis not present

## 2017-11-26 DIAGNOSIS — Z992 Dependence on renal dialysis: Secondary | ICD-10-CM | POA: Diagnosis not present

## 2017-11-28 DIAGNOSIS — N186 End stage renal disease: Secondary | ICD-10-CM | POA: Diagnosis not present

## 2017-11-28 DIAGNOSIS — N2581 Secondary hyperparathyroidism of renal origin: Secondary | ICD-10-CM | POA: Diagnosis not present

## 2017-11-28 DIAGNOSIS — E119 Type 2 diabetes mellitus without complications: Secondary | ICD-10-CM | POA: Diagnosis not present

## 2017-11-28 DIAGNOSIS — Z992 Dependence on renal dialysis: Secondary | ICD-10-CM | POA: Diagnosis not present

## 2017-12-03 DIAGNOSIS — E119 Type 2 diabetes mellitus without complications: Secondary | ICD-10-CM | POA: Diagnosis not present

## 2017-12-03 DIAGNOSIS — Z992 Dependence on renal dialysis: Secondary | ICD-10-CM | POA: Diagnosis not present

## 2017-12-03 DIAGNOSIS — N2581 Secondary hyperparathyroidism of renal origin: Secondary | ICD-10-CM | POA: Diagnosis not present

## 2017-12-03 DIAGNOSIS — N186 End stage renal disease: Secondary | ICD-10-CM | POA: Diagnosis not present

## 2017-12-05 DIAGNOSIS — N186 End stage renal disease: Secondary | ICD-10-CM | POA: Diagnosis not present

## 2017-12-05 DIAGNOSIS — E119 Type 2 diabetes mellitus without complications: Secondary | ICD-10-CM | POA: Diagnosis not present

## 2017-12-05 DIAGNOSIS — N2581 Secondary hyperparathyroidism of renal origin: Secondary | ICD-10-CM | POA: Diagnosis not present

## 2017-12-05 DIAGNOSIS — Z992 Dependence on renal dialysis: Secondary | ICD-10-CM | POA: Diagnosis not present

## 2017-12-12 DIAGNOSIS — Z992 Dependence on renal dialysis: Secondary | ICD-10-CM | POA: Diagnosis not present

## 2017-12-12 DIAGNOSIS — E1122 Type 2 diabetes mellitus with diabetic chronic kidney disease: Secondary | ICD-10-CM | POA: Diagnosis not present

## 2017-12-12 DIAGNOSIS — N2581 Secondary hyperparathyroidism of renal origin: Secondary | ICD-10-CM | POA: Diagnosis not present

## 2017-12-12 DIAGNOSIS — N186 End stage renal disease: Secondary | ICD-10-CM | POA: Diagnosis not present

## 2017-12-12 DIAGNOSIS — E119 Type 2 diabetes mellitus without complications: Secondary | ICD-10-CM | POA: Diagnosis not present

## 2017-12-13 ENCOUNTER — Other Ambulatory Visit: Payer: Self-pay

## 2017-12-13 ENCOUNTER — Inpatient Hospital Stay (HOSPITAL_COMMUNITY)
Admission: EM | Admit: 2017-12-13 | Discharge: 2017-12-16 | DRG: 312 | Disposition: A | Discharge status: Skilled Nursing Facility | Payer: Medicare Other | Attending: Internal Medicine | Admitting: Internal Medicine

## 2017-12-13 ENCOUNTER — Encounter (HOSPITAL_COMMUNITY): Payer: Self-pay | Admitting: *Deleted

## 2017-12-13 ENCOUNTER — Emergency Department (HOSPITAL_COMMUNITY): Payer: Medicare Other

## 2017-12-13 DIAGNOSIS — N2581 Secondary hyperparathyroidism of renal origin: Secondary | ICD-10-CM | POA: Diagnosis not present

## 2017-12-13 DIAGNOSIS — Z7982 Long term (current) use of aspirin: Secondary | ICD-10-CM

## 2017-12-13 DIAGNOSIS — R55 Syncope and collapse: Secondary | ICD-10-CM | POA: Diagnosis not present

## 2017-12-13 DIAGNOSIS — Z794 Long term (current) use of insulin: Secondary | ICD-10-CM

## 2017-12-13 DIAGNOSIS — I499 Cardiac arrhythmia, unspecified: Secondary | ICD-10-CM

## 2017-12-13 DIAGNOSIS — Z8673 Personal history of transient ischemic attack (TIA), and cerebral infarction without residual deficits: Secondary | ICD-10-CM

## 2017-12-13 DIAGNOSIS — S299XXA Unspecified injury of thorax, initial encounter: Secondary | ICD-10-CM | POA: Diagnosis not present

## 2017-12-13 DIAGNOSIS — N186 End stage renal disease: Secondary | ICD-10-CM

## 2017-12-13 DIAGNOSIS — Z882 Allergy status to sulfonamides status: Secondary | ICD-10-CM

## 2017-12-13 DIAGNOSIS — Y92129 Unspecified place in nursing home as the place of occurrence of the external cause: Secondary | ICD-10-CM

## 2017-12-13 DIAGNOSIS — I455 Other specified heart block: Secondary | ICD-10-CM

## 2017-12-13 DIAGNOSIS — Z992 Dependence on renal dialysis: Secondary | ICD-10-CM | POA: Diagnosis not present

## 2017-12-13 DIAGNOSIS — E1149 Type 2 diabetes mellitus with other diabetic neurological complication: Secondary | ICD-10-CM | POA: Diagnosis present

## 2017-12-13 DIAGNOSIS — R112 Nausea with vomiting, unspecified: Secondary | ICD-10-CM | POA: Diagnosis present

## 2017-12-13 DIAGNOSIS — K219 Gastro-esophageal reflux disease without esophagitis: Secondary | ICD-10-CM | POA: Diagnosis present

## 2017-12-13 DIAGNOSIS — E785 Hyperlipidemia, unspecified: Secondary | ICD-10-CM | POA: Diagnosis present

## 2017-12-13 DIAGNOSIS — W19XXXA Unspecified fall, initial encounter: Secondary | ICD-10-CM | POA: Diagnosis present

## 2017-12-13 DIAGNOSIS — Z89612 Acquired absence of left leg above knee: Secondary | ICD-10-CM

## 2017-12-13 DIAGNOSIS — Z79899 Other long term (current) drug therapy: Secondary | ICD-10-CM

## 2017-12-13 DIAGNOSIS — Z87891 Personal history of nicotine dependence: Secondary | ICD-10-CM

## 2017-12-13 DIAGNOSIS — I440 Atrioventricular block, first degree: Secondary | ICD-10-CM | POA: Diagnosis present

## 2017-12-13 DIAGNOSIS — Z66 Do not resuscitate: Secondary | ICD-10-CM | POA: Diagnosis present

## 2017-12-13 DIAGNOSIS — R05 Cough: Secondary | ICD-10-CM | POA: Diagnosis present

## 2017-12-13 DIAGNOSIS — R296 Repeated falls: Secondary | ICD-10-CM | POA: Diagnosis present

## 2017-12-13 DIAGNOSIS — I132 Hypertensive heart and chronic kidney disease with heart failure and with stage 5 chronic kidney disease, or end stage renal disease: Secondary | ICD-10-CM | POA: Diagnosis not present

## 2017-12-13 DIAGNOSIS — T148XXA Other injury of unspecified body region, initial encounter: Secondary | ICD-10-CM | POA: Diagnosis not present

## 2017-12-13 DIAGNOSIS — E1122 Type 2 diabetes mellitus with diabetic chronic kidney disease: Secondary | ICD-10-CM | POA: Diagnosis present

## 2017-12-13 DIAGNOSIS — R0602 Shortness of breath: Secondary | ICD-10-CM | POA: Diagnosis not present

## 2017-12-13 DIAGNOSIS — Z9115 Patient's noncompliance with renal dialysis: Secondary | ICD-10-CM

## 2017-12-13 DIAGNOSIS — D631 Anemia in chronic kidney disease: Secondary | ICD-10-CM | POA: Diagnosis present

## 2017-12-13 DIAGNOSIS — R001 Bradycardia, unspecified: Secondary | ICD-10-CM | POA: Diagnosis present

## 2017-12-13 DIAGNOSIS — I5032 Chronic diastolic (congestive) heart failure: Secondary | ICD-10-CM | POA: Diagnosis present

## 2017-12-13 DIAGNOSIS — G4730 Sleep apnea, unspecified: Secondary | ICD-10-CM | POA: Diagnosis present

## 2017-12-13 DIAGNOSIS — Z9119 Patient's noncompliance with other medical treatment and regimen: Secondary | ICD-10-CM

## 2017-12-13 DIAGNOSIS — N189 Chronic kidney disease, unspecified: Secondary | ICD-10-CM

## 2017-12-13 DIAGNOSIS — S81011A Laceration without foreign body, right knee, initial encounter: Secondary | ICD-10-CM | POA: Diagnosis not present

## 2017-12-13 DIAGNOSIS — G40909 Epilepsy, unspecified, not intractable, without status epilepticus: Secondary | ICD-10-CM | POA: Diagnosis present

## 2017-12-13 DIAGNOSIS — I480 Paroxysmal atrial fibrillation: Secondary | ICD-10-CM | POA: Diagnosis present

## 2017-12-13 DIAGNOSIS — I350 Nonrheumatic aortic (valve) stenosis: Secondary | ICD-10-CM

## 2017-12-13 DIAGNOSIS — Z888 Allergy status to other drugs, medicaments and biological substances status: Secondary | ICD-10-CM

## 2017-12-13 DIAGNOSIS — Z89411 Acquired absence of right great toe: Secondary | ICD-10-CM

## 2017-12-13 DIAGNOSIS — J9811 Atelectasis: Secondary | ICD-10-CM | POA: Diagnosis present

## 2017-12-13 DIAGNOSIS — Z993 Dependence on wheelchair: Secondary | ICD-10-CM

## 2017-12-13 DIAGNOSIS — Z91040 Latex allergy status: Secondary | ICD-10-CM

## 2017-12-13 DIAGNOSIS — F329 Major depressive disorder, single episode, unspecified: Secondary | ICD-10-CM | POA: Diagnosis present

## 2017-12-13 DIAGNOSIS — E1151 Type 2 diabetes mellitus with diabetic peripheral angiopathy without gangrene: Secondary | ICD-10-CM | POA: Diagnosis present

## 2017-12-13 DIAGNOSIS — Z880 Allergy status to penicillin: Secondary | ICD-10-CM

## 2017-12-13 DIAGNOSIS — I739 Peripheral vascular disease, unspecified: Secondary | ICD-10-CM | POA: Diagnosis present

## 2017-12-13 LAB — BASIC METABOLIC PANEL
ANION GAP: 14 (ref 5–15)
BUN: 54 mg/dL — ABNORMAL HIGH (ref 6–20)
CALCIUM: 9.5 mg/dL (ref 8.9–10.3)
CO2: 23 mmol/L (ref 22–32)
Chloride: 99 mmol/L — ABNORMAL LOW (ref 101–111)
Creatinine, Ser: 7.36 mg/dL — ABNORMAL HIGH (ref 0.44–1.00)
GFR, EST AFRICAN AMERICAN: 6 mL/min — AB (ref >60)
GFR, EST NON AFRICAN AMERICAN: 5 mL/min — AB (ref >60)
GLUCOSE: 152 mg/dL — AB (ref 65–99)
Potassium: 5 mmol/L (ref 3.5–5.1)
Sodium: 136 mmol/L (ref 135–145)

## 2017-12-13 LAB — CBC WITH DIFFERENTIAL/PLATELET
BASOS ABS: 0 10*3/uL (ref 0.0–0.1)
BASOS PCT: 0 %
Eosinophils Absolute: 0.4 10*3/uL (ref 0.0–0.7)
Eosinophils Relative: 6 %
HEMATOCRIT: 37.5 % (ref 36.0–46.0)
Hemoglobin: 11.8 g/dL — ABNORMAL LOW (ref 12.0–15.0)
LYMPHS PCT: 27 %
Lymphs Abs: 1.9 10*3/uL (ref 0.7–4.0)
MCH: 28.1 pg (ref 26.0–34.0)
MCHC: 31.5 g/dL (ref 30.0–36.0)
MCV: 89.3 fL (ref 78.0–100.0)
MONO ABS: 0.5 10*3/uL (ref 0.1–1.0)
Monocytes Relative: 7 %
NEUTROS ABS: 4.2 10*3/uL (ref 1.7–7.7)
NEUTROS PCT: 60 %
Platelets: 197 10*3/uL (ref 150–400)
RBC: 4.2 MIL/uL (ref 3.87–5.11)
RDW: 16.8 % — AB (ref 11.5–15.5)
WBC: 6.9 10*3/uL (ref 4.0–10.5)

## 2017-12-13 LAB — MAGNESIUM: MAGNESIUM: 2.8 mg/dL — AB (ref 1.7–2.4)

## 2017-12-13 LAB — PROTIME-INR
INR: 1.16
Prothrombin Time: 14.7 seconds (ref 11.4–15.2)

## 2017-12-13 LAB — CBG MONITORING, ED: Glucose-Capillary: 144 mg/dL — ABNORMAL HIGH (ref 65–99)

## 2017-12-13 LAB — I-STAT TROPONIN, ED: TROPONIN I, POC: 0.02 ng/mL (ref 0.00–0.08)

## 2017-12-13 LAB — TSH: TSH: 2.139 u[IU]/mL (ref 0.350–4.500)

## 2017-12-13 MED ORDER — ONDANSETRON HCL 4 MG/2ML IJ SOLN
4.0000 mg | Freq: Once | INTRAMUSCULAR | Status: AC
  Administered 2017-12-13: 4 mg via INTRAVENOUS

## 2017-12-13 MED ORDER — ONDANSETRON HCL 4 MG/2ML IJ SOLN
INTRAMUSCULAR | Status: AC
  Filled 2017-12-13: qty 2

## 2017-12-13 NOTE — ED Notes (Signed)
To c-t and back  pts nausea is better now  Sleeping intermittently

## 2017-12-13 NOTE — ED Provider Notes (Signed)
[facility] EMERGENCY DEPARTMENT Provider Note   CSN: [id number] Arrival date & time: 12/13/17  1920     History   Chief Complaint Chief Complaint  Patient presents with  . Loss of Consciousness    HPI Pailyn Bellevue is a 72 y.o. female.  Patient sustained a fall at her nursing facility.  She endorses recently being given her evening medications, which included pain medications.  EMS found the patient down and is concerned for possible aspiration based on emesis on scene and the patient's position.  En route, the patient's heart rate became very slow and had a 6 second pause.  EMS placed pads and prepared to pacer the patient, but patient's HR improved prior to equipment being ready.  EKG obtained by EMS demonstrates likely 3rd degree heart block.    Loss of Consciousness   This is a new problem. The current episode started less than 1 hour ago. Episode frequency: once. The problem has been resolved. Length of episode of loss of consciousness: unknown. Associated with: eating dinner while seated on side of bed. Associated symptoms include nausea and vomiting. Pertinent negatives include abdominal pain, back pain, chest pain, diaphoresis, fever, focal weakness, palpitations, seizures, slurred speech, visual change and weakness. Past medical history comments: esrd, on dialysis.    Past Medical History:  Diagnosis Date  . Anemia   . Anxiety   . CHF (congestive heart failure) ([place])   . Constipation   . Critical lower limb ischemia   . Depression   . ESRD on dialysis [facility])    "TTS; [facility] [place]" (11/27/2016)  . GERD (gastroesophageal reflux disease)   . Heart murmur   . HLD (hyperlipidemia)   . Hypertension   . Memory loss   . Peripheral vascular disease ([place])   . Refusal of blood transfusions as patient is Jehovah's Witness   . Seizures ([place]) 04/2015   s/p CVA.  [name] Sleep apnea    Noncompliant with CPAP.  [name] Stroke ([place]) 2015  . Type II diabetes mellitus  [facility])     Patient Active Problem List   Diagnosis Date Noted  . Hypertension   . Hypertensive urgency 11/09/2017  . Ischemia of right lower extremity 06/14/2017  . Hyperkalemia 06/05/2017  . Bleeding due to dialysis catheter placement ([place]) 06/05/2017  . History of hysterectomy 03/09/2017  . Former smoker 03/09/2017  . Dysphagia 03/09/2017  . Carotid stenosis 03/09/2017  . Need for assistance due to reduced mobility 03/09/2017  . Complication of vascular access for dialysis 03/09/2017  . Anemia in chronic kidney disease (CKD) 03/09/2017  . Sleep apnea   . Refusal of blood transfusions as patient is Jehovah's Witness   . Constipation   . GERD (gastroesophageal reflux disease)   . HLD (hyperlipidemia)   . TIA (transient ischemic attack) 02/01/2017  . Diabetic ulcer of toe of right foot associated with diabetes mellitus due to underlying condition, limited to breakdown of skin ([place])   . PVD (peripheral vascular disease) ([place]) 11/27/2016  . Blurry vision   . Asymmetric SNHL (sensorineural hearing loss) 06/12/2016  . Peripheral neuropathy 06/11/2016  . History of pulmonary embolism 06/08/2016  . History of CVA with residual deficit 06/08/2016  . History of borderline personality disorder 04/19/2016  . History of medication noncompliance 04/19/2016  . Paroxysmal atrial fibrillation ([place]) 04/19/2016  . Seizure disorder ([place]) 04/19/2016  . Renovascular hypertension 04/13/2016  . ESRD on dialysis ([place]) 04/11/2016  . Generalized anxiety disorder 04/11/2016  . Major depression,  recurrent, chronic ([place]) 04/11/2016  . S/P AKA (above knee amputation) unilateral, left ([place]) 04/11/2016  . PAD (peripheral artery disease) ([place]) 12/15/2015  . Type II diabetes mellitus with neurological manifestations ([place]) 10/21/2015  . Seizures ([place]) 04/13/2015    Past Surgical History:  Procedure Laterality Date  . ABDOMINAL AORTOGRAM W/LOWER EXTREMITY Right 06/13/2017   Procedure: Abdominal Aortogram  w/Lower Extremity;  Surgeon: Waynetta Sandy, MD;  Location: [place] CV LAB;  Service: Cardiovascular;  Laterality: Right;  . ABDOMINAL HYSTERECTOMY    . AMPUTATION Left 12/15/2015   Procedure: LEFT ABOVE KNEE AMPUTATION;  Surgeon: Serafina Mitchell, MD;  Location: [place];  Service: Vascular;  Laterality: Left;  . AMPUTATION Right 06/19/2017   Procedure: AMPUTATION right great toe;  Surgeon: Serafina Mitchell, MD;  Location: [place];  Service: Vascular;  Laterality: Right;  . BASCILIC VEIN TRANSPOSITION Right 11/25/2015   Procedure: Right Arm FIRST STAGE BASILIC VEIN TRANSPOSITION;  Surgeon: Serafina Mitchell, MD;  Location: [place];  Service: Vascular;  Laterality: Right;  . BASCILIC VEIN TRANSPOSITION Right 01/19/2016   Procedure: RIGHT SECOND STAGE BASILIC VEIN TRANSPOSITION;  Surgeon: Serafina Mitchell, MD;  Location: [place];  Service: Vascular;  Laterality: Right;  . DILATION AND CURETTAGE OF UTERUS    . EYE SURGERY    . IR FLUORO GUIDE CV LINE RIGHT  06/05/2017  . PERIPHERAL VASCULAR BALLOON ANGIOPLASTY Right 06/13/2017   Procedure: PERIPHERAL VASCULAR BALLOON ANGIOPLASTY;  Surgeon: Waynetta Sandy, MD;  Location: [place] CV LAB;  Service: Cardiovascular;  Laterality: Right;  POPLITEAL/SFA/PERONEAL  . PERIPHERAL VASCULAR CATHETERIZATION Bilateral 11/28/2015   Procedure: Lower Extremity Angiography;  Surgeon: Lorretta Harp, MD;  Location: [place] CV LAB;  Service: Cardiovascular;  Laterality: Bilateral;  . PERIPHERAL VASCULAR CATHETERIZATION N/A 11/28/2015   Procedure: Abdominal Aortogram;  Surgeon: Lorretta Harp, MD;  Location: [place] CV LAB;  Service: Cardiovascular;  Laterality: N/A;  . PERIPHERAL VASCULAR CATHETERIZATION Right 11/27/2016  . PERIPHERAL VASCULAR CATHETERIZATION N/A 11/27/2016   Procedure: Abdominal Aortogram w/Lower Extremity;  Surgeon: Serafina Mitchell, MD;  Location: [place] CV LAB;  Service: Cardiovascular;  Laterality: N/A;  . PERIPHERAL VASCULAR  CATHETERIZATION N/A 11/27/2016   Procedure: Right arm and central venogram;  Surgeon: Serafina Mitchell, MD;  Location: [place] CV LAB;  Service: Cardiovascular;  Laterality: N/A;  . PERIPHERAL VASCULAR CATHETERIZATION Right 11/27/2016   Procedure: Peripheral Vascular Atherectomy;  Surgeon: Serafina Mitchell, MD;  Location: [place] CV LAB;  Service: Cardiovascular;  Laterality: Right;  Rt    sfa, popliteal, tibioperoneal trunk, peroneal  . PERIPHERAL VASCULAR CATHETERIZATION Right 11/27/2016   Procedure: Peripheral Vascular Balloon Angioplasty;  Surgeon: Serafina Mitchell, MD;  Location: [place] CV LAB;  Service: Cardiovascular;  Laterality: Right;  Tibial peroneal  . RETINOPATHY SURGERY Bilateral   . TOE AMPUTATION Right    2nd, 3rd, 4th, 5th digits  . TONSILLECTOMY      OB History    No data available       Home Medications    Prior to Admission medications   Medication Sig Start Date End Date Taking? Authorizing Provider  Amino Acids-Protein Hydrolys (FEEDING SUPPLEMENT, PRO-STAT SUGAR FREE 64,) LIQD Take 30 mLs by mouth 2 (two) times daily. (0900 & 2100) 06/14/17   Virgina Jock A, PA-C  amLODipine (NORVASC) 10 MG tablet Take 1 tablet (10 mg total) by mouth daily. 11/16/17   Edwin Dada, MD  aspirin EC 81 MG tablet Take  81 mg by mouth daily.    [provider]  atorvastatin (LIPITOR) 40 MG tablet Take 1 tablet (40 mg total) by mouth daily. 03/07/17   Briscoe Deutscher, DO  calcium acetate (PHOSLO) 667 MG capsule Take 2,001 mg by mouth 3 (three) times daily with meals.     [provider]  carvedilol (COREG) 3.125 MG tablet Take 1 tablet (3.125 mg total) by mouth 2 (two) times daily with a meal. 07/30/16   Reyne Dumas, MD  citalopram (CELEXA) 10 MG tablet Take 10 mg by mouth daily. (0900) 09/27/16   [provider]  esomeprazole (NEXIUM) 20 MG capsule Take 1 capsule (20 mg total) by mouth daily. 03/11/17   Briscoe Deutscher, DO  gabapentin (NEURONTIN)  100 MG capsule Take 1 capsule (100 mg total) by mouth at bedtime. 06/20/17   Alvia Grove, PA-C  hydrALAZINE (APRESOLINE) 10 MG tablet Take 1 tablet (10 mg total) by mouth every 8 (eight) hours. 11/15/17   Danford, Suann Larry, MD  insulin aspart (NOVOLOG) 100 UNIT/ML injection Inject 2-10 Units into the skin 3 (three) times daily as needed for high blood sugar. 151-200=2 units, 201-250=4 units, 251-300=6 units, 301-350=8 units, 351-400=10 units, >400=10 units and Call MD    [provider]  levETIRAcetam (KEPPRA) 500 MG tablet Take 1 tablet (500 mg total) by mouth 2 (two) times daily. 11/15/17   Danford, Suann Larry, MD  multivitamin (RENA-VIT) TABS tablet Take 1 tablet by mouth daily. (0900)    [provider]  Nutritional Supplements (FEEDING SUPPLEMENT, NEPRO CARB STEADY,) LIQD Take 237 mLs by mouth daily with lunch. 06/20/17   Alvia Grove, PA-C  ondansetron (ZOFRAN ODT) 4 MG disintegrating tablet Take 1 tablet (4 mg total) by mouth every 8 (eight) hours as needed for nausea or vomiting. 08/30/17   Gareth Morgan, MD  polyethylene glycol powder (GLYCOLAX/MIRALAX) powder Take 17 g by mouth at bedtime. HOLD FOR LOOSE STOOLS     [provider]  psyllium (REGULOID) 0.52 g capsule One po tid prn constipation. Patient taking differently: Take 0.52 g by mouth 3 (three) times daily as needed (for constipation.).  03/22/17   Briscoe Deutscher, DO    Family History Family History  Problem Relation Age of Onset  . Asthma Mother   . Cancer Sister     Social History Social History   Tobacco Use  . Smoking status: Former Smoker    Packs/day: 0.25    Years: 51.00    Pack years: 12.75    Types: Cigarettes    Last attempt to quit: 04/13/2015    Years since quitting: 2.6  . Smokeless tobacco: Never Used  Substance Use Topics  . Alcohol use: Yes    Alcohol/week: 0.0 oz    Comment: 11/27/2016 "stopped in 04/2015"  . Drug use: No     Allergies   Penicillins; Sulfa  antibiotics; Amoxicillin; and Latex   Review of Systems Review of Systems  Constitutional: Negative for chills, diaphoresis and fever.  HENT: Negative for ear pain and sore throat.   Eyes: Negative for pain and visual disturbance.  Respiratory: Positive for shortness of breath (placed on oxygen). Negative for cough.   Cardiovascular: Positive for syncope. Negative for chest pain and palpitations.  Gastrointestinal: Positive for nausea and vomiting. Negative for abdominal pain.  Genitourinary: Negative for dysuria and hematuria.  Musculoskeletal: Negative for arthralgias and back pain.  Skin: Negative for color change and rash.  Neurological: Negative for focal weakness, seizures, syncope and  weakness.  All other systems reviewed and are negative.    Physical Exam Updated Vital Signs BP (!) 157/76   Pulse 78   Temp 97.6 F (36.4 C) (Oral)   Resp 16   Ht 5' (1.524 m)   Wt 70.8 kg (156 lb)   SpO2 100%   BMI 30.47 kg/m   Physical Exam  Constitutional: She is oriented to person, place, and time. She appears well-developed and well-nourished. No distress.  HENT:  Head: Normocephalic and atraumatic.  Eyes: Conjunctivae and EOM are normal.  Pinpoint pupils  Neck:  Initially in towel wrap for spine protection.  Cardiovascular: Normal rate and regular rhythm.  Pulmonary/Chest: Effort normal and breath sounds normal. No respiratory distress.  Abdominal: Soft. There is no tenderness.  Musculoskeletal: She exhibits no edema.  Neurological: She is alert and oriented to person, place, and time.  Skin: Skin is warm and dry.  Psychiatric: She has a normal mood and affect.  Nursing note and vitals reviewed.    ED Treatments / Results  Labs (all labs ordered are listed, but only abnormal results are displayed) Labs Reviewed  CBG MONITORING, ED - Abnormal; Notable for the following components:      Result Value   Glucose-Capillary 144 (*)    All other components within normal  limits  CBC WITH DIFFERENTIAL/PLATELET  BASIC METABOLIC PANEL  TSH  MAGNESIUM  PROTIME-INR  I-STAT TROPONIN, ED    EKG  EKG Interpretation  Date/Time:  Friday December 13 2017 19:27:12 EST Ventricular Rate:  116 PR Interval:    QRS Duration: 101 QT Interval:  414 QTC Calculation: 475 R Axis:   -31 Text Interpretation:  Sinus tachycardia Multiform ventricular premature complexes Prolonged PR interval Left axis deviation Anterior infarct, old When compared to prior, longer PR.  No STEMI Confirmed by Antony Blackbird [phone]) on 12/13/2017 7:39:15 PM       Radiology No results found.  Procedures Procedures (including critical care time)  Medications Ordered in ED Medications - No data to display   Initial Impression / Assessment and Plan / ED Course  I have reviewed the triage vital signs and the nursing notes.  Pertinent labs & imaging results that were available during my care of the patient were reviewed by me and considered in my medical decision making (see chart for details).     Ms. Leven is a 72 year old female with past medical history significant for end-stage renal disease on dialysis, CHF, peripheral vascular disease, diabetes who presents for syncopal episode.  While in transit, EMS noted intermittent bradycardia but with a period of asystole lasting approximately 6 seconds.  EKG reviewed and demonstrates likely third-degree heart block.  Patient sustained a fall with loss of consciousness.  CT head obtained, personally reviewed by me, demonstrates similar findings from priors with no acute bleeding.  Chest x-ray obtained, personally reviewed by me, demonstrates no acute cardiac or pulmonary processes.  Labs are significant for negative troponin, anemia baseline, elevated BUN and creatinine consistent with prior values.  Her glucose values are normal to high.  Cardiologist consulted and recommends admission to hospitalist service.  Patient admitted to  hospitalist.  Final Clinical Impressions(s) / ED Diagnoses   Final diagnoses:  Syncope, unspecified syncope type  Cardiac arrhythmia, unspecified cardiac arrhythmia type    ED Discharge Orders    None       Elveria Rising, MD 12/13/17 2359    Tegeler, Gwenyth Allegra, MD 12/14/17 1323

## 2017-12-13 NOTE — ED Notes (Signed)
Admitting doctor at the bedside 

## 2017-12-13 NOTE — ED Notes (Signed)
Ems brought the pt in pacing pads because the pt bradycardic whenever she vomited

## 2017-12-13 NOTE — ED Triage Notes (Addendum)
The pt  Arrived [facility] from [facility]  Pt was found in the floor  Unknown how she came out of bed  Dialysis pt  Unknown last time she was dialyzed io rt tibia.  bk amp rt leg    Lt foot foot amp  Dialysis catheter rt thigh last dialyzed Thursday.  Ems reported that the pts heart rate bradycardic while she was vomiting on the way here  Pt on zo;; pads when she arrived continued

## 2017-12-13 NOTE — ED Notes (Signed)
[facility], [facility] called to check on pt. [phone]

## 2017-12-13 NOTE — Consult Note (Signed)
CARDIOLOGY CONSULT NOTE   Referring Physician: Dr. Sherry Ruffing Primary Physician: [name] Primary Cardiologist: Dr. Gwenlyn Found (in the past) Reason for Consultation: syncope   HPI: Deborah Hendricks is a delightful 72 yo woman with PMH PVD s/p left AKA and right TMA, ESRD on HD T/Th/Sat, DM2, hypertension, hyperlipidemia, atrial fibrillation not on anticoagulation, CVAx2, GERD, OSA not on CPAP who is seen in consult at the request of Dr. Sherry Ruffing for syncope and concern for heart block as cause. Earlier today, she was found on the floor of her nursing home. The patient does not remember much of the event. She endorses closing her eyes just prior and "everything was white." She denies lightheadedness, warmth, diaphoresis, chest pain, or palpitations. Denies postictal confusion, no loss of bowel/bladder. She did feel nauseated at the time and remembers vomiting "a lot." Does not think she has ever had Hendricks episode like this before, though she does endorse two prior strokes but "I don't remember what I felt with them."   EMS was called to nursing facility. Per report, there was concern for aspiration given patient's position and the emesis noted. While in transport, EMS rhythm strip showed bradycardia (reviewed) and per report, there was also a 6 second pause. This pause was unfortunately not captured on strips. By the time patient was prepped for pacing, her rhythm had returned to normal sinus.  The patient reports being in generally stable health, lives at assisted living facility, undergoes dialysis routinely. She states that the only medical concern she has had recently is that sometimes she "goes crazy" at dialysis, and she has had a mild productive cough for "some time." No fevers/chills, she is unsure what sputum looks like because she doesn't check. No shortness of breath.  While in the ER, electrolytes are unremarkable. ECG shows normal sinus rhythm with 1st degree AV block. Significant baseline  artifact. Old anteroseptal infarct pattern unchanged. Telemetry in the ER has been NSR with rates in the 60s-70s.   Review of Systems:     Cardiac Review of Systems: {Y] = yes [ ]  = no  Chest Pain [    ]  Resting SOB [   ] Exertional SOB  [  ]  Orthopnea [  ]   Pedal Edema [   Y chronic R LE]    Palpitations [  ] Syncope  [ Y ]   Presyncope [ possible  ]  General Review of Systems: [Y] = yes [  ]=no Constitional: recent weight change [  ]; anorexia [  ]; fatigue [  ]; nausea [Y  ]; night sweats [  ]; fever [  ]; or chills [  ];                                                                     Eyes : blurred vision [  ]; diplopia [   ]; vision changes [  ];  Amaurosis fugax[  ]; Resp: cough [  Y];  wheezing[  ];  hemoptysis[  ];  PND [  ];  GI:  gallstones[  ], vomiting[Y  ];  dysphagia[  ]; melena[  ];  hematochezia [  ]; heartburn[Y  ];   GU: kidney stones [  ]; hematuria[  ];  dysuria [  ];  nocturia[  ]; incontinence [  ];             Skin: rash, swelling[  ];, hair loss[  ];  peripheral edema[Y  ];  or itching[  ]; Musculosketetal: myalgias[  ];  joint swelling[  ];  joint erythema[  ];  joint pain[  ];  back pain[  ];  Heme/Lymph: bruising[  ];  bleeding[  ];  anemia[  ];  Neuro: TIA[  ];  headaches[  ];  stroke[  ];  vertigo[  ];  seizures[  ];   paresthesias[  ];  difficulty walking[  ];  Psych:depression[  ]; anxiety[  ];  Endocrine: diabetes[  ];  thyroid dysfunction[  ];  Other:  Past Medical History:  Diagnosis Date  . Anemia   . Anxiety   . CHF (congestive heart failure) ([place])   . Constipation   . Critical lower limb ischemia   . Depression   . ESRD on dialysis [facility])    "TTS; [place]" (11/27/2016)  . GERD (gastroesophageal reflux disease)   . Heart murmur   . HLD (hyperlipidemia)   . Hypertension   . Memory loss   . Peripheral vascular disease ([place])   . Refusal of blood transfusions as patient is Jehovah's Witness   . Seizures ([place]) 04/2015   s/p CVA.  [name] Sleep  apnea    Noncompliant with CPAP.  [name] Stroke ([place]) 2015  . Type II diabetes mellitus ([place])      (Not in a hospital admission)   . ondansetron        Infusions:   Allergies  Allergen Reactions  . Penicillins Hives, Itching, Rash and Other (See Comments)    Has patient had a PCN reaction causing immediate rash, facial/tongue/throat swelling, SOB or lightheadedness with hypotension: Yes Has patient had a PCN reaction causing severe rash involving mucus membranes or skin necrosis:unknown MAR source Has patient had a PCN reaction that required hospitalization:unknown MAR source Has patient had a PCN reaction occurring within the last 10 years:unknown MAR source If all of the above answers are "NO", then may proceed with Cephalosporin use.  . Sulfa Antibiotics     UNSPECIFIED REACTION   . Amoxicillin Hives, Itching and Rash    Has patient had a PCN reaction causing immediate rash, facial/tongue/throat swelling, SOB or lightheadedness with hypotension:unknown MAR source Has patient had a PCN reaction causing severe rash involving mucus membranes or skin necrosis:unknown MAR source Has patient had a PCN reaction that required hospitalization:unknown MAR source Has patient had a PCN reaction occurring within the last 10 years: unknown MAR source If all of the above answers are "NO", then may proceed with Cephalosporin use.   . Latex Rash    Social History   Socioeconomic History  . Marital status: Single    Spouse name: Not on file  . Number of children: Not on file  . Years of education: Not on file  . Highest education level: Not on file  Social Needs  . Financial resource strain: Not on file  . Food insecurity - worry: Not on file  . Food insecurity - inability: Not on file  . Transportation needs - medical: Not on file  . Transportation needs - non-medical: Not on file  Occupational History  . Not on file  Tobacco Use  . Smoking status: Former Smoker    Packs/day: 0.25      Years: 51.00    Pack years:  12.75    Types: Cigarettes    Last attempt to quit: 04/13/2015    Years since quitting: 2.6  . Smokeless tobacco: Never Used  Substance and Sexual Activity  . Alcohol use: Yes    Alcohol/week: 0.0 oz    Comment: 11/27/2016 "stopped in 04/2015"  . Drug use: No  . Sexual activity: No  Other Topics Concern  . Not on file  Social History Narrative  . Not on file    Family History  Problem Relation Age of Onset  . Asthma Mother   . Cancer Sister     PHYSICAL EXAM: Vitals:   12/13/17 2145 12/13/17 2215  BP: (!) 158/77 (!) 143/73  Pulse: 77 75  Resp: 14 (!) 7  Temp:    SpO2: 100% 100%    No intake or output data in the 24 hours ending 12/13/17 2252  General:  Well appearing. No respiratory difficulty. Slow speech but appropriate HEENT: NCAT, MMM Neck: supple. JVD at clavicle at 45 degrees. Carotids 2+ bilat. No lymphadenopathy or thryomegaly appreciated. Cor: Pacing pads in place. Regular S1 and S2 with early peaking 2/6 systolic cresc/decresc murmur best hear at LUSB Lungs: clear, R base exam c/w hemidiaphragm on CXR Abdomen: soft, nontender, nondistended. Good bowel sounds. Extremities: L AKA without erythema or tenderness. R TMA with chronic venous stasis changes. Both extremities Deborah. Neuro: alert, face symmetric, speech slow but appropriate. Moves all 4 limbs independently  ECG: NSR, 1st degree AV block. Prior anteroseptal infarct  Results for orders placed or performed during the hospital encounter of 12/13/17 (from the past 24 hour(s))  CBG monitoring, ED     Status: Abnormal   Collection Time: 12/13/17  7:31 PM  Result Value Ref Range   Glucose-Capillary 144 (H) 65 - 99 mg/dL   Comment 1 Notify RN    Comment 2 Document in Chart   TSH     Status: None   Collection Time: 12/13/17  7:38 PM  Result Value Ref Range   TSH 2.139 0.350 - 4.500 uIU/mL  CBC with Differential     Status: Abnormal   Collection Time: 12/13/17  7:44 PM   Result Value Ref Range   WBC 6.9 4.0 - 10.5 K/uL   RBC 4.20 3.87 - 5.11 MIL/uL   Hemoglobin 11.8 (L) 12.0 - 15.0 g/dL   HCT 37.5 36.0 - 46.0 %   MCV 89.3 78.0 - 100.0 fL   MCH 28.1 26.0 - 34.0 pg   MCHC 31.5 30.0 - 36.0 g/dL   RDW 16.8 (H) 11.5 - 15.5 %   Platelets 197 150 - 400 K/uL   Neutrophils Relative % 60 %   Neutro Abs 4.2 1.7 - 7.7 K/uL   Lymphocytes Relative 27 %   Lymphs Abs 1.9 0.7 - 4.0 K/uL   Monocytes Relative 7 %   Monocytes Absolute 0.5 0.1 - 1.0 K/uL   Eosinophils Relative 6 %   Eosinophils Absolute 0.4 0.0 - 0.7 K/uL   Basophils Relative 0 %   Basophils Absolute 0.0 0.0 - 0.1 K/uL  Basic metabolic panel     Status: Abnormal   Collection Time: 12/13/17  7:44 PM  Result Value Ref Range   Sodium 136 135 - 145 mmol/L   Potassium 5.0 3.5 - 5.1 mmol/L   Chloride 99 (L) 101 - 111 mmol/L   CO2 23 22 - 32 mmol/L   Glucose, Bld 152 (H) 65 - 99 mg/dL   BUN 54 (H) 6 - 20 mg/dL  Creatinine, Ser 7.36 (H) 0.44 - 1.00 mg/dL   Calcium 9.5 8.9 - 10.3 mg/dL   GFR calc non Af Amer 5 (L) >60 mL/min   GFR calc Af Amer 6 (L) >60 mL/min   Anion gap 14 5 - 15  Magnesium     Status: Abnormal   Collection Time: 12/13/17  7:44 PM  Result Value Ref Range   Magnesium 2.8 (H) 1.7 - 2.4 mg/dL  Protime-INR     Status: None   Collection Time: 12/13/17  7:44 PM  Result Value Ref Range   Prothrombin Time 14.7 11.4 - 15.2 seconds   INR 1.16   I-Stat Troponin, ED (not at [facility])     Status: None   Collection Time: 12/13/17  7:50 PM  Result Value Ref Range   Troponin i, poc 0.02 0.00 - 0.08 ng/mL   Comment 3           Ct Head Wo Contrast  Result Date: 12/13/2017 CLINICAL DATA:  Found on floor. EXAM: CT HEAD WITHOUT CONTRAST TECHNIQUE: Contiguous axial images were obtained from the base of the skull through the vertex without intravenous contrast. COMPARISON:  11/12/2017. FINDINGS: Brain: No evidence for acute infarction, hemorrhage, mass lesion, hydrocephalus, or extra-axial fluid.  Advanced atrophy, with multiple old infarcts, noted on prior scan, greater on the LEFT. Chronic microvascular ischemic change with hypoattenuation of a moderate to advanced nature. Vascular: Calcification of the cavernous internal carotid arteries consistent with cerebrovascular atherosclerotic disease. No signs of intracranial large vessel occlusion. Skull: Normal. Negative for fracture or focal lesion. Sinuses/Orbits: Mild mucosal thickening but no significant opacity or layering fluid. BILATERAL cataract extraction. Other: None. IMPRESSION: Advanced atrophy and small vessel disease. No acute intracranial findings. Similar appearance to most recent priors. Electronically Signed   By: Staci Righter M.D.   On: 12/13/2017 20:22   Dg Chest Portable 1 View  Result Date: 12/13/2017 CLINICAL DATA:  Status post fall.  Possible aspiration. EXAM: PORTABLE CHEST 1 VIEW COMPARISON:  11/09/2017 FINDINGS: Femoral approach dialysis catheter overlies the expected location of right atrium. Cardiomediastinal silhouette is normal. Mediastinal contours appear intact. Chronic elevation of the right hemidiaphragm. There is no evidence of focal airspace consolidation, pleural effusion or pneumothorax. Right lower lobe atelectatic changes. Osseous structures are without acute abnormality. Soft tissues are grossly normal. IMPRESSION: Chronic elevation of the right hemidiaphragm with right lower lobe atelectatic changes. Electronically Signed   By: Fidela Salisbury M.D.   On: 12/13/2017 19:59   ASSESSMENT/RECOMMENDATIONS: Deborah Hendricks is a delightful 72 yo woman with PMH PVD s/p left AKA and right TMA, ESRD on HD T/Th/Sat, DM2, hypertension, hyperlipidemia, atrial fibrillation not on anticoagulation, CVAx2, GERD, OSA not on CPAP who is seen in consult at the request of Dr. Sherry Ruffing for syncope and concern for heart block as cause.  We unfortunately do not have strips during the 6 second pause. She does have a period of time captured  on EMS strips with bradycardia to the 30-40 bpm range, but the baseline makes it difficult to interpret the p-wave pattern. It is also unknown what was occurring at the time, IE was she retching/straining etc that might have affected her vagal tone.  The event itself is unclear, and it is difficult to determine at this time if Hendricks arrhythmia/heart block was the cause. Given this, recommend observation at least overnight on telemetry to determine if she has any significant rhythm abnormalities. Her only rate agent is carvedilol 3.125 BID, and I would hold this  overnight while being monitored. Her QTc is currently acceptable (she is on citalopram chronically).   We will reevaluate her in the morning, but if there is a significant event overnight, please do not hesitate to contact us. Thank you for allowing [place] to participate in the care of this patient.  Buford Dresser, MD, PhD, overnight cardiology provider.

## 2017-12-13 NOTE — ED Notes (Signed)
The pt speaks very slowly  She reports that she has had 2 strokes in the past.. She is alert and oriented skin warm and dry

## 2017-12-13 NOTE — ED Notes (Signed)
Pt asking for chicken broth- given

## 2017-12-13 NOTE — ED Notes (Signed)
Pt remains alert  Asking how much longer she needs to stay here

## 2017-12-14 DIAGNOSIS — I739 Peripheral vascular disease, unspecified: Secondary | ICD-10-CM | POA: Diagnosis not present

## 2017-12-14 DIAGNOSIS — I132 Hypertensive heart and chronic kidney disease with heart failure and with stage 5 chronic kidney disease, or end stage renal disease: Secondary | ICD-10-CM | POA: Diagnosis not present

## 2017-12-14 DIAGNOSIS — N2581 Secondary hyperparathyroidism of renal origin: Secondary | ICD-10-CM | POA: Diagnosis present

## 2017-12-14 DIAGNOSIS — E1151 Type 2 diabetes mellitus with diabetic peripheral angiopathy without gangrene: Secondary | ICD-10-CM | POA: Diagnosis present

## 2017-12-14 DIAGNOSIS — J9811 Atelectasis: Secondary | ICD-10-CM | POA: Diagnosis present

## 2017-12-14 DIAGNOSIS — Z89612 Acquired absence of left leg above knee: Secondary | ICD-10-CM

## 2017-12-14 DIAGNOSIS — E785 Hyperlipidemia, unspecified: Secondary | ICD-10-CM | POA: Diagnosis present

## 2017-12-14 DIAGNOSIS — I350 Nonrheumatic aortic (valve) stenosis: Secondary | ICD-10-CM | POA: Diagnosis not present

## 2017-12-14 DIAGNOSIS — D631 Anemia in chronic kidney disease: Secondary | ICD-10-CM | POA: Diagnosis not present

## 2017-12-14 DIAGNOSIS — Z66 Do not resuscitate: Secondary | ICD-10-CM | POA: Diagnosis present

## 2017-12-14 DIAGNOSIS — I499 Cardiac arrhythmia, unspecified: Secondary | ICD-10-CM | POA: Diagnosis not present

## 2017-12-14 DIAGNOSIS — R296 Repeated falls: Secondary | ICD-10-CM | POA: Diagnosis present

## 2017-12-14 DIAGNOSIS — W19XXXA Unspecified fall, initial encounter: Secondary | ICD-10-CM | POA: Diagnosis present

## 2017-12-14 DIAGNOSIS — Z992 Dependence on renal dialysis: Secondary | ICD-10-CM

## 2017-12-14 DIAGNOSIS — R112 Nausea with vomiting, unspecified: Secondary | ICD-10-CM | POA: Diagnosis present

## 2017-12-14 DIAGNOSIS — N186 End stage renal disease: Secondary | ICD-10-CM

## 2017-12-14 DIAGNOSIS — I509 Heart failure, unspecified: Secondary | ICD-10-CM | POA: Diagnosis not present

## 2017-12-14 DIAGNOSIS — Y92129 Unspecified place in nursing home as the place of occurrence of the external cause: Secondary | ICD-10-CM | POA: Diagnosis not present

## 2017-12-14 DIAGNOSIS — I480 Paroxysmal atrial fibrillation: Secondary | ICD-10-CM | POA: Diagnosis present

## 2017-12-14 DIAGNOSIS — I455 Other specified heart block: Secondary | ICD-10-CM | POA: Diagnosis not present

## 2017-12-14 DIAGNOSIS — E1149 Type 2 diabetes mellitus with other diabetic neurological complication: Secondary | ICD-10-CM | POA: Diagnosis not present

## 2017-12-14 DIAGNOSIS — F329 Major depressive disorder, single episode, unspecified: Secondary | ICD-10-CM | POA: Diagnosis present

## 2017-12-14 DIAGNOSIS — R001 Bradycardia, unspecified: Secondary | ICD-10-CM | POA: Diagnosis present

## 2017-12-14 DIAGNOSIS — Z8673 Personal history of transient ischemic attack (TIA), and cerebral infarction without residual deficits: Secondary | ICD-10-CM | POA: Diagnosis not present

## 2017-12-14 DIAGNOSIS — I340 Nonrheumatic mitral (valve) insufficiency: Secondary | ICD-10-CM | POA: Diagnosis not present

## 2017-12-14 DIAGNOSIS — R55 Syncope and collapse: Secondary | ICD-10-CM | POA: Diagnosis not present

## 2017-12-14 DIAGNOSIS — I361 Nonrheumatic tricuspid (valve) insufficiency: Secondary | ICD-10-CM | POA: Diagnosis not present

## 2017-12-14 DIAGNOSIS — G40909 Epilepsy, unspecified, not intractable, without status epilepticus: Secondary | ICD-10-CM | POA: Diagnosis present

## 2017-12-14 DIAGNOSIS — I440 Atrioventricular block, first degree: Secondary | ICD-10-CM | POA: Diagnosis not present

## 2017-12-14 DIAGNOSIS — E1122 Type 2 diabetes mellitus with diabetic chronic kidney disease: Secondary | ICD-10-CM | POA: Diagnosis present

## 2017-12-14 DIAGNOSIS — I5032 Chronic diastolic (congestive) heart failure: Secondary | ICD-10-CM | POA: Diagnosis present

## 2017-12-14 DIAGNOSIS — G4730 Sleep apnea, unspecified: Secondary | ICD-10-CM | POA: Diagnosis present

## 2017-12-14 DIAGNOSIS — R05 Cough: Secondary | ICD-10-CM | POA: Diagnosis present

## 2017-12-14 DIAGNOSIS — K219 Gastro-esophageal reflux disease without esophagitis: Secondary | ICD-10-CM | POA: Diagnosis present

## 2017-12-14 LAB — RENAL FUNCTION PANEL
ANION GAP: 13 (ref 5–15)
Albumin: 3.4 g/dL — ABNORMAL LOW (ref 3.5–5.0)
BUN: 57 mg/dL — ABNORMAL HIGH (ref 6–20)
CHLORIDE: 100 mmol/L — AB (ref 101–111)
CO2: 23 mmol/L (ref 22–32)
Calcium: 9.3 mg/dL (ref 8.9–10.3)
Creatinine, Ser: 7.48 mg/dL — ABNORMAL HIGH (ref 0.44–1.00)
GFR, EST AFRICAN AMERICAN: 6 mL/min — AB (ref >60)
GFR, EST NON AFRICAN AMERICAN: 5 mL/min — AB (ref >60)
Glucose, Bld: 134 mg/dL — ABNORMAL HIGH (ref 65–99)
PHOSPHORUS: 2.4 mg/dL — AB (ref 2.5–4.6)
POTASSIUM: 5.1 mmol/L (ref 3.5–5.1)
Sodium: 136 mmol/L (ref 135–145)

## 2017-12-14 LAB — TROPONIN I
Troponin I: <0.03 ng/mL (ref <0.03)
Troponin I: <0.03 ng/mL (ref <0.03)

## 2017-12-14 LAB — GLUCOSE, CAPILLARY
GLUCOSE-CAPILLARY: 74 mg/dL (ref 65–99)
GLUCOSE-CAPILLARY: 159 mg/dL — AB (ref 65–99)

## 2017-12-14 LAB — CBC
HCT: 36 % (ref 36.0–46.0)
HEMOGLOBIN: 11.2 g/dL — AB (ref 12.0–15.0)
MCH: 27.9 pg (ref 26.0–34.0)
MCHC: 31.1 g/dL (ref 30.0–36.0)
MCV: 89.6 fL (ref 78.0–100.0)
Platelets: 175 10*3/uL (ref 150–400)
RBC: 4.02 MIL/uL (ref 3.87–5.11)
RDW: 16.7 % — ABNORMAL HIGH (ref 11.5–15.5)
WBC: 9.4 10*3/uL (ref 4.0–10.5)

## 2017-12-14 LAB — MRSA PCR SCREENING: MRSA by PCR: NEGATIVE

## 2017-12-14 LAB — MAGNESIUM: MAGNESIUM: 2.7 mg/dL — AB (ref 1.7–2.4)

## 2017-12-14 MED ORDER — SODIUM CHLORIDE 0.9 % IV SOLN: 100.0000 mL | INTRAVENOUS | Status: DC | PRN

## 2017-12-14 MED ORDER — ATORVASTATIN CALCIUM 40 MG PO TABS
40.0000 mg | ORAL_TABLET | Freq: Every day | ORAL | Status: DC
  Administered 2017-12-14 – 2017-12-16 (×3): 40 mg via ORAL
  Filled 2017-12-14 (×3): qty 1

## 2017-12-14 MED ORDER — LIDOCAINE HCL (PF) 1 % IJ SOLN: 5.0000 mL | INTRAMUSCULAR | Status: DC | PRN

## 2017-12-14 MED ORDER — ASPIRIN EC 81 MG PO TBEC
81.0000 mg | DELAYED_RELEASE_TABLET | Freq: Every day | ORAL | Status: DC
  Administered 2017-12-14 – 2017-12-16 (×3): 81 mg via ORAL
  Filled 2017-12-14 (×3): qty 1

## 2017-12-14 MED ORDER — ONDANSETRON HCL 4 MG/2ML IJ SOLN
4.0000 mg | INTRAMUSCULAR | Status: DC | PRN
  Administered 2017-12-15 (×2): 4 mg via INTRAVENOUS
  Filled 2017-12-14 (×2): qty 2

## 2017-12-14 MED ORDER — PANTOPRAZOLE SODIUM 40 MG IV SOLR
40.0000 mg | Freq: Two times a day (BID) | INTRAVENOUS | Status: DC
  Administered 2017-12-14 – 2017-12-16 (×5): 40 mg via INTRAVENOUS
  Filled 2017-12-14 (×5): qty 40

## 2017-12-14 MED ORDER — ACETAMINOPHEN 325 MG PO TABS
650.0000 mg | ORAL_TABLET | Freq: Four times a day (QID) | ORAL | Status: DC | PRN
  Administered 2017-12-14 – 2017-12-16 (×3): 650 mg via ORAL
  Filled 2017-12-14 (×3): qty 2

## 2017-12-14 MED ORDER — AMLODIPINE BESYLATE 10 MG PO TABS
10.0000 mg | ORAL_TABLET | Freq: Every day | ORAL | Status: DC
  Administered 2017-12-15 – 2017-12-16 (×2): 10 mg via ORAL
  Filled 2017-12-14: qty 1
  Filled 2017-12-14 (×3): qty 2

## 2017-12-14 MED ORDER — PENTAFLUOROPROP-TETRAFLUOROETH EX AERO: 1.0000 "application " | INHALATION_SPRAY | CUTANEOUS | Status: DC | PRN

## 2017-12-14 MED ORDER — HYDRALAZINE HCL 10 MG PO TABS
10.0000 mg | ORAL_TABLET | Freq: Three times a day (TID) | ORAL | Status: DC
  Administered 2017-12-14 – 2017-12-16 (×7): 10 mg via ORAL
  Filled 2017-12-14 (×7): qty 1

## 2017-12-14 MED ORDER — ONDANSETRON HCL 4 MG/2ML IJ SOLN
4.0000 mg | Freq: Three times a day (TID) | INTRAMUSCULAR | Status: DC | PRN
  Administered 2017-12-14: 4 mg via INTRAVENOUS
  Filled 2017-12-14: qty 2

## 2017-12-14 MED ORDER — DOXERCALCIFEROL 4 MCG/2ML IV SOLN
INTRAVENOUS | Status: AC
  Filled 2017-12-14: qty 2

## 2017-12-14 MED ORDER — HEPARIN SODIUM (PORCINE) 1000 UNIT/ML DIALYSIS
5000.0000 [IU] | INTRAMUSCULAR | Status: DC | PRN
  Filled 2017-12-14: qty 5

## 2017-12-14 MED ORDER — HEPARIN SODIUM (PORCINE) 1000 UNIT/ML DIALYSIS: 1000.0000 [IU] | INTRAMUSCULAR | Status: DC | PRN

## 2017-12-14 MED ORDER — SUCRALFATE 1 GM/10ML PO SUSP
1.0000 g | Freq: Three times a day (TID) | ORAL | Status: DC
  Administered 2017-12-14 – 2017-12-16 (×8): 1 g via ORAL
  Filled 2017-12-14 (×8): qty 10

## 2017-12-14 MED ORDER — DOXERCALCIFEROL 4 MCG/2ML IV SOLN
1.0000 ug | INTRAVENOUS | Status: DC
  Administered 2017-12-14: 1 ug via INTRAVENOUS
  Filled 2017-12-14: qty 2

## 2017-12-14 MED ORDER — LEVETIRACETAM 500 MG PO TABS
500.0000 mg | ORAL_TABLET | Freq: Two times a day (BID) | ORAL | Status: DC
  Administered 2017-12-14 – 2017-12-16 (×5): 500 mg via ORAL
  Filled 2017-12-14 (×6): qty 1

## 2017-12-14 MED ORDER — TRAZODONE HCL 50 MG PO TABS
50.0000 mg | ORAL_TABLET | Freq: Every evening | ORAL | Status: DC | PRN
  Administered 2017-12-14 – 2017-12-15 (×2): 50 mg via ORAL
  Filled 2017-12-14 (×2): qty 1

## 2017-12-14 MED ORDER — INSULIN ASPART 100 UNIT/ML ~~LOC~~ SOLN
0.0000 [IU] | Freq: Three times a day (TID) | SUBCUTANEOUS | Status: DC
  Administered 2017-12-14: 2 [IU] via SUBCUTANEOUS

## 2017-12-14 MED ORDER — LIDOCAINE-PRILOCAINE 2.5-2.5 % EX CREA
1.0000 "application " | TOPICAL_CREAM | CUTANEOUS | Status: DC | PRN
  Filled 2017-12-14: qty 5

## 2017-12-14 MED ORDER — HEPARIN SODIUM (PORCINE) 5000 UNIT/ML IJ SOLN
5000.0000 [IU] | Freq: Three times a day (TID) | INTRAMUSCULAR | Status: DC
  Administered 2017-12-14 – 2017-12-16 (×6): 5000 [IU] via SUBCUTANEOUS
  Filled 2017-12-14 (×7): qty 1

## 2017-12-14 MED ORDER — ALTEPLASE 2 MG IJ SOLR: 2.0000 mg | Freq: Once | INTRAMUSCULAR | Status: DC | PRN

## 2017-12-14 NOTE — Progress Notes (Signed)
PROGRESS NOTE    Licet Dunphy  VVO:[id number] DOB: 1946-08-04 DOA: 12/13/2017 PCP: [name], MD    Brief Narrative:  72 year old female who presented after a syncope episode. She does have a significant past medical history for end-stage renal disease on hemodialysis, dyslipidemia, hypertension, type 2 diabetes mellitus, atrial fibrillation, peripheral vascular disease status post left AKA, diastolic heart failure and history of CVA with seizures. Patient had a sudden loss of consciousness while seated, apparently she had a prodrome of nausea, then she was found down with emesis nearby. EMS found her to be bradycardic and a reported but not recorded six second pause, suspected third-degree AV block. On the initial physical examination blood pressure 152/70, heart rate 72, respiratory rate 10, oxygen saturation 100%, dry mucous membranes, lungs are clear to auscultation bilaterally, heart S1-S2 present, positive systolic murmur, abdomen soft nontender, no lower extremity edema, left AKA. Sodium 136, potassium 5.0, chloride 99, bicarbonate 23, glucose 152, BUN 54, creatinine 7.6, magnesium 2.8, white count 6.9, hemoglobin 11.8, hematocrit 37.5, platelets 197. Head CT with advanced atrophy and small vessel disease, no acute changes. Chest x-ray with right rotation, right hemidiaphragm elevation, no infiltrates. EKG heart rate 116 beats per minute, first degree AV block, left axis deviation, premature ventricular complexes.  Patient was admitted to the hospital with working diagnosis of syncope due to symptomatic bradycardia, rule out third-degree AV block   Assessment & Plan:   Principal Problem:   Syncope and collapse Active Problems:   Type II diabetes mellitus with neurological manifestations (HCC)   PAD (peripheral artery disease) (HCC)   ESRD on dialysis ([place])   S/P AKA (above knee amputation) unilateral, left (HCC)   Anemia in chronic kidney disease ([facility])   AV block, 1st  degree  1. Syncope due to bradycardia, rule out third-degree AV block. Will continue to hold on b blockade, continue telemetry monitoring and follow on echocardiogram. Will continue antiemetics for persistent nausea. Suspected nausea triggered a vagal reflex, provoking worsening bradycardia and posteriorly syncope.   2. Refractive nausea and vomiting. Will increase frequency of zofran to as needed q 4 hours, will add sucralfate, continue pantoprazole and will change diet to clear liquids.   2. Atrial fibrillation, paroxysmal. Will continue telemetry monitoring, holding av blockade due to bradycardia. Antiplatelet therapy with aspirin.   3. Type 2 diabetes mellitus. Continue insulin sliding scale for glucose cover and monitoring. Will change diet to clear liquids for now.   4. End-stage renal disease on hemodialysis. Contacted nephrology for scheduled [place] hemodialysis.   5. HTN. Will continue blood pressure control with amlodipine, and as needed hydralazine. Systolic blood pressure 269 to 160.   6. Seizures. Will continue keppra per home regimen. No clinical signs of seizures on presentation.   DVT prophylaxis: scd  Code Status:  full Family Communication:  No family at the bedside Disposition Plan:  home   Consultants:   Cardiology   Procedures:     Antimicrobials:       Subjective: Patient still feeling nausea and vomiting, she is not tolerating regular, diet, no significant abdominal pain but positive dyspepsia, has hx of GERD. Her dialysis days are T-TH-Sat.   Objective: Vitals:   12/14/17 0130 12/14/17 0200 12/14/17 0215 12/14/17 0700  BP: (!) 158/84 (!) 161/72 (!) 161/72   Pulse: 74 72 72   Resp: 16 11 10 14   Temp:  98.1 F (36.7 C)  97.8 F (36.6 C)  TempSrc:  Oral  Oral  SpO2: 100% 100%  100%   Weight:  69 kg (152 lb 1.9 oz)    Height:  5\' 6"  (1.676 m)     No intake or output data in the 24 hours ending 12/14/17 0832 Filed Weights   12/13/17 1926 12/14/17  0200  Weight: 70.8 kg (156 lb) 69 kg (152 lb 1.9 oz)    Examination:   General: Not in pain or dyspnea, deconditioned and ill looking appering Neurology: Awake and alert, non focal  E ENT: mild pallor, no icterus, oral mucosa moist Cardiovascular: No JVD. S1-S2 present, rhythmic, bradycardic, no gallops, rubs, or murmurs. No lower extremity edema. Left AKA.  Pulmonary: vesicular breath sounds bilaterally, adequate air movement, no wheezing, rhonchi or rales. Gastrointestinal. Abdomen protuberant, no organomegaly, non tender, no rebound or guarding Skin. No rashes Musculoskeletal: no joint deformities HD access right femoral tunneled catheter.      Data Reviewed: I have personally reviewed following labs and imaging studies  CBC: Recent Labs  Lab 12/13/17 1944 12/14/17 0201  WBC 6.9 9.4  NEUTROABS 4.2  --   HGB 11.8* 11.2*  HCT 37.5 36.0  MCV 89.3 89.6  PLT 197 937   Basic Metabolic Panel: Recent Labs  Lab 12/13/17 1944 12/14/17 0201  NA 136 136  K 5.0 5.1  CL 99* 100*  CO2 23 23  GLUCOSE 152* 134*  BUN 54* 57*  CREATININE 7.36* 7.48*  CALCIUM 9.5 9.3  MG 2.8* 2.7*  PHOS  --  2.4*   GFR: Estimated Creatinine Clearance: 6.4 mL/min (A) (by C-G formula based on SCr of 7.48 mg/dL (H)). Liver Function Tests: Recent Labs  Lab 12/14/17 0201  ALBUMIN 3.4*   No results for input(s): LIPASE, AMYLASE in the last 168 hours. No results for input(s): AMMONIA in the last 168 hours. Coagulation Profile: Recent Labs  Lab 12/13/17 1944  INR 1.16   Cardiac Enzymes: Recent Labs  Lab 12/14/17 0201  TROPONINI <0.03   BNP (last 3 results) No results for input(s): PROBNP in the last 8760 hours. HbA1C: No results for input(s): HGBA1C in the last 72 hours. CBG: Recent Labs  Lab 12/13/17 1931  GLUCAP 144*   Lipid Profile: No results for input(s): CHOL, HDL, LDLCALC, TRIG, CHOLHDL, LDLDIRECT in the last 72 hours. Thyroid Function Tests: Recent Labs     12/13/17 1938  TSH 2.139   Anemia Panel: No results for input(s): VITAMINB12, FOLATE, FERRITIN, TIBC, IRON, RETICCTPCT in the last 72 hours.    Radiology Studies: I have reviewed all of the imaging during this hospital visit personally     Scheduled Meds: . amLODipine  10 mg Oral Daily  . aspirin EC  81 mg Oral Daily  . atorvastatin  40 mg Oral Daily  . heparin  5,000 Units Subcutaneous Q8H  . hydrALAZINE  10 mg Oral Q8H  . insulin aspart  0-9 Units Subcutaneous TID WC  . levETIRAcetam  500 mg Oral BID   Continuous Infusions:   LOS: 0 days        Kastiel Simonian Gerome Apley, MD [facility] Pager [phone]

## 2017-12-14 NOTE — Progress Notes (Deleted)
[facility] Renal Consultation Note    Indication for Consultation:  Management of ESRD/hemodialysis; anemia, hypertension/volume and secondary hyperparathyroidism  XVQ:[id number], Jori Moll, MD  HPI: Deborah Hendricks is an ESRD patient who has dialysis on TTS at [facility].  PMH significant for HTN, CHF, depression, multiple CVA, and h/o seizures.  She presented to the ED after an episode of LOC with vomiting at the nursing facility where she lives.  EMS reported patient being bradycardic with a 6 sec pause while in transport to the hospital.  Labs in the ED are unremarkable, EKG showed 1st degree HB, CXR shows possible vascular congestion.   Seen and examined at bedside.  States she was eating lunch yesterday and she passed out.  Reports no warning or associated symptoms. Her major concern is "screaming fits" she experiences at dialysis, claims they mostly occur there and she can not control them. Denies current n/v/d, abdominal pain, SOB, chest pain, dizziness, syncope, and edema. She has been admitted for observation due to syncope, collapse and concern for 3rd degree heart block.  Consulted for dialysis.  Of note patient is historically non compliant with dialysis and recently is not reaching her EDW.  She missed 2 treatments this past week and signed off 1.5hrs early at the one she attended.   Past Medical History:  Diagnosis Date  . Anemia   . Anxiety   . CHF (congestive heart failure) ([place])   . Constipation   . Critical lower limb ischemia   . Depression   . ESRD on dialysis [facility])    "TTS; [place]" (11/27/2016)  . GERD (gastroesophageal reflux disease)   . Heart murmur   . HLD (hyperlipidemia)   . Hypertension   . Memory loss   . Peripheral vascular disease ([place])   . Refusal of blood transfusions as patient is Jehovah's Witness   . Seizures ([place]) 04/2015   s/p CVA.  [name] Sleep apnea    Noncompliant with CPAP.  [name] Stroke ([place]) 2015  . Type II diabetes mellitus ([place])     Past Surgical History:  Procedure Laterality Date  . ABDOMINAL AORTOGRAM W/LOWER EXTREMITY Right 06/13/2017   Procedure: Abdominal Aortogram w/Lower Extremity;  Surgeon: Waynetta Sandy, MD;  Location: [place] CV LAB;  Service: Cardiovascular;  Laterality: Right;  . ABDOMINAL HYSTERECTOMY    . AMPUTATION Left 12/15/2015   Procedure: LEFT ABOVE KNEE AMPUTATION;  Surgeon: Serafina Mitchell, MD;  Location: [place];  Service: Vascular;  Laterality: Left;  . AMPUTATION Right 06/19/2017   Procedure: AMPUTATION right great toe;  Surgeon: Serafina Mitchell, MD;  Location: [place];  Service: Vascular;  Laterality: Right;  . BASCILIC VEIN TRANSPOSITION Right 11/25/2015   Procedure: Right Arm FIRST STAGE BASILIC VEIN TRANSPOSITION;  Surgeon: Serafina Mitchell, MD;  Location: [place];  Service: Vascular;  Laterality: Right;  . BASCILIC VEIN TRANSPOSITION Right 01/19/2016   Procedure: RIGHT SECOND STAGE BASILIC VEIN TRANSPOSITION;  Surgeon: Serafina Mitchell, MD;  Location: [place];  Service: Vascular;  Laterality: Right;  . DILATION AND CURETTAGE OF UTERUS    . EYE SURGERY    . IR FLUORO GUIDE CV LINE RIGHT  06/05/2017  . PERIPHERAL VASCULAR BALLOON ANGIOPLASTY Right 06/13/2017   Procedure: PERIPHERAL VASCULAR BALLOON ANGIOPLASTY;  Surgeon: Waynetta Sandy, MD;  Location: [place] CV LAB;  Service: Cardiovascular;  Laterality: Right;  POPLITEAL/SFA/PERONEAL  . PERIPHERAL VASCULAR CATHETERIZATION Bilateral 11/28/2015   Procedure: Lower Extremity Angiography;  Surgeon: Lorretta Harp, MD;  Location: [place] [facility];  Service: Cardiovascular;  Laterality: Bilateral;  . PERIPHERAL VASCULAR CATHETERIZATION N/A 11/28/2015   Procedure: Abdominal Aortogram;  Surgeon: Lorretta Harp, MD;  Location: [place] [facility];  Service: Cardiovascular;  Laterality: N/A;  . PERIPHERAL VASCULAR CATHETERIZATION Right 11/27/2016  . PERIPHERAL VASCULAR CATHETERIZATION N/A 11/27/2016   Procedure: Abdominal Aortogram  w/Lower Extremity;  Surgeon: Serafina Mitchell, MD;  Location: [place] [facility];  Service: Cardiovascular;  Laterality: N/A;  . PERIPHERAL VASCULAR CATHETERIZATION N/A 11/27/2016   Procedure: Right arm and central venogram;  Surgeon: Serafina Mitchell, MD;  Location: [place] [facility];  Service: Cardiovascular;  Laterality: N/A;  . PERIPHERAL VASCULAR CATHETERIZATION Right 11/27/2016   Procedure: Peripheral Vascular Atherectomy;  Surgeon: Serafina Mitchell, MD;  Location: [place] [facility];  Service: Cardiovascular;  Laterality: Right;  Rt    sfa, popliteal, tibioperoneal trunk, peroneal  . PERIPHERAL VASCULAR CATHETERIZATION Right 11/27/2016   Procedure: Peripheral Vascular Balloon Angioplasty;  Surgeon: Serafina Mitchell, MD;  Location: [place] [facility];  Service: Cardiovascular;  Laterality: Right;  Tibial peroneal  . RETINOPATHY SURGERY Bilateral   . TOE AMPUTATION Right    2nd, 3rd, 4th, 5th digits  . TONSILLECTOMY     Family History  Problem Relation Age of Onset  . Asthma Mother   . Cancer Sister    Social History:  reports that she quit smoking about 2 years ago. Her smoking use included cigarettes. She has a 12.75 pack-year smoking history. she has never used smokeless tobacco. She reports that she drinks alcohol. She reports that she does not use drugs. Allergies  Allergen Reactions  . Penicillins Hives, Itching, Rash and Other (See Comments)    Has patient had a PCN reaction causing immediate rash, facial/tongue/throat swelling, SOB or lightheadedness with hypotension: Yes Has patient had a PCN reaction causing severe rash involving mucus membranes or skin necrosis:unknown MAR source Has patient had a PCN reaction that required hospitalization:unknown MAR source Has patient had a PCN reaction occurring within the last 10 years:unknown MAR source If all of the above answers are "NO", then may proceed with Cephalosporin use.  . Sulfa Antibiotics     UNSPECIFIED REACTION   .  Amoxicillin Hives, Itching and Rash    Has patient had a PCN reaction causing immediate rash, facial/tongue/throat swelling, SOB or lightheadedness with hypotension:unknown MAR source Has patient had a PCN reaction causing severe rash involving mucus membranes or skin necrosis:unknown MAR source Has patient had a PCN reaction that required hospitalization:unknown MAR source Has patient had a PCN reaction occurring within the last 10 years: unknown MAR source If all of the above answers are "NO", then may proceed with Cephalosporin use.   . Latex Rash   Prior to Admission medications   Medication Sig Start Date End Date Taking? Authorizing Provider  Amino Acids-Protein Hydrolys (FEEDING SUPPLEMENT, PRO-STAT SUGAR FREE 64,) LIQD Take 30 mLs by mouth 2 (two) times daily. (0900 & 2100) 06/14/17  Yes Virgina Jock A, PA-C  amLODipine (NORVASC) 10 MG tablet Take 1 tablet (10 mg total) by mouth daily. 11/16/17  Yes Danford, Suann Larry, MD  aspirin EC 81 MG tablet Take 81 mg by mouth daily.   Yes [provider]  atorvastatin (LIPITOR) 40 MG tablet Take 1 tablet (40 mg total) by mouth daily. 03/07/17  Yes Briscoe Deutscher, DO  calcium acetate (PHOSLO) 667 MG capsule Take 2,001 mg by mouth 3 (three) times daily with meals.  Yes [provider]  carvedilol (COREG) 3.125 MG tablet Take 1 tablet (3.125 mg total) by mouth 2 (two) times daily with a meal. 07/30/16  Yes Reyne Dumas, MD  citalopram (CELEXA) 10 MG tablet Take 10 mg by mouth daily. (0900) 09/27/16  Yes [provider]  esomeprazole (NEXIUM) 20 MG capsule Take 1 capsule (20 mg total) by mouth daily. 03/11/17  Yes Briscoe Deutscher, DO  gabapentin (NEURONTIN) 100 MG capsule Take 1 capsule (100 mg total) by mouth at bedtime. 06/20/17  Yes Virgina Jock A, PA-C  hydrALAZINE (APRESOLINE) 10 MG tablet Take 1 tablet (10 mg total) by mouth every 8 (eight) hours. 11/15/17  Yes Danford, Suann Larry, MD  insulin aspart (NOVOLOG) 100  UNIT/ML injection Inject 2-10 Units into the skin 3 (three) times daily as needed for high blood sugar. 151-200=2 units, 201-250=4 units, 251-300=6 units, 301-350=8 units, 351-400=10 units, >400=10 units and Call MD   Yes [provider]  levETIRAcetam (KEPPRA) 500 MG tablet Take 1 tablet (500 mg total) by mouth 2 (two) times daily. 11/15/17  Yes Danford, Suann Larry, MD  Multiple Vitamin (MULTIVITAMIN) tablet Take 1 tablet by mouth daily.   Yes [provider]  Nutritional Supplements (FEEDING SUPPLEMENT, NEPRO CARB STEADY,) LIQD Take 237 mLs by mouth daily with lunch. 06/20/17  Yes Virgina Jock A, PA-C  ondansetron (ZOFRAN ODT) 4 MG disintegrating tablet Take 1 tablet (4 mg total) by mouth every 8 (eight) hours as needed for nausea or vomiting. 08/30/17  Yes Schlossman, Junie Panning, MD  polyethylene glycol powder (GLYCOLAX/MIRALAX) powder Take 17 g by mouth at bedtime. HOLD FOR LOOSE STOOLS    Yes [provider]  psyllium (REGULOID) 0.52 g capsule One po tid prn constipation. Patient taking differently: Take 0.52 g by mouth 3 (three) times daily as needed (for constipation.).  03/22/17  Yes Briscoe Deutscher, DO   Current Facility-Administered Medications  Medication Dose Route Frequency Provider Last Rate Last Dose  . acetaminophen (TYLENOL) tablet 650 mg  650 mg Oral Q6H PRN Lovey Newcomer T, NP   650 mg at 12/14/17 0439  . amLODipine (NORVASC) tablet 10 mg  10 mg Oral Daily Norval Morton, MD   Stopped at 12/14/17 1029  . aspirin EC tablet 81 mg  81 mg Oral Daily Smith, Rondell A, MD   81 mg at 12/14/17 1021  . atorvastatin (LIPITOR) tablet 40 mg  40 mg Oral Daily Tamala Julian, Rondell A, MD   40 mg at 12/14/17 1025  . doxercalciferol (HECTOROL) injection 1 mcg  1 mcg Intravenous Q T,Th,Sa-HD Vedh Ptacek, PA      . heparin injection 5,000 Units  5,000 Units Subcutaneous Q8H Smith, Rondell A, MD      . hydrALAZINE (APRESOLINE) tablet 10 mg  10 mg Oral Q8H Smith, Rondell A, MD    10 mg at 12/14/17 8938  . insulin aspart (novoLOG) injection 0-9 Units  0-9 Units Subcutaneous TID WC Norval Morton, MD   2 Units at 12/14/17 1202  . levETIRAcetam (KEPPRA) tablet 500 mg  500 mg Oral BID Fuller Plan A, MD   500 mg at 12/14/17 1021  . ondansetron (ZOFRAN) injection 4 mg  4 mg Intravenous Q4H PRN Arrien, Jimmy Picket, MD      . pantoprazole (PROTONIX) injection 40 mg  40 mg Intravenous Q12H Arrien, Jimmy Picket, MD   40 mg at 12/14/17 1020  . sucralfate (CARAFATE) 1 GM/10ML suspension 1 g  1 g Oral TID WC & HS Arrien,  Jimmy Picket, MD   1 g at 12/14/17 1205   Labs: Basic Metabolic Panel: Recent Labs  Lab 12/13/17 1944 12/14/17 0201  NA 136 136  K 5.0 5.1  CL 99* 100*  CO2 23 23  GLUCOSE 152* 134*  BUN 54* 57*  CREATININE 7.36* 7.48*  CALCIUM 9.5 9.3  PHOS  --  2.4*   Liver Function Tests: Recent Labs  Lab 12/14/17 0201  ALBUMIN 3.4*   No results for input(s): LIPASE, AMYLASE in the last 168 hours. No results for input(s): AMMONIA in the last 168 hours. CBC: Recent Labs  Lab 12/13/17 1944 12/14/17 0201  WBC 6.9 9.4  NEUTROABS 4.2  --   HGB 11.8* 11.2*  HCT 37.5 36.0  MCV 89.3 89.6  PLT 197 175   Cardiac Enzymes: Recent Labs  Lab 12/14/17 0201 12/14/17 0752  TROPONINI <0.03 <0.03   CBG: Recent Labs  Lab 12/13/17 1931 12/14/17 0908 12/14/17 1154  GLUCAP 144* 74 159*   Studies/Results: Ct Head Wo Contrast  Result Date: 12/13/2017 CLINICAL DATA:  Found on floor. EXAM: CT HEAD WITHOUT CONTRAST TECHNIQUE: Contiguous axial images were obtained from the base of the skull through the vertex without intravenous contrast. COMPARISON:  11/12/2017. FINDINGS: Brain: No evidence for acute infarction, hemorrhage, mass lesion, hydrocephalus, or extra-axial fluid. Advanced atrophy, with multiple old infarcts, noted on prior scan, greater on the LEFT. Chronic microvascular ischemic change with hypoattenuation of a moderate to advanced nature.  Vascular: Calcification of the cavernous internal carotid arteries consistent with cerebrovascular atherosclerotic disease. No signs of intracranial large vessel occlusion. Skull: Normal. Negative for fracture or focal lesion. Sinuses/Orbits: Mild mucosal thickening but no significant opacity or layering fluid. BILATERAL cataract extraction. Other: None. IMPRESSION: Advanced atrophy and small vessel disease. No acute intracranial findings. Similar appearance to most recent priors. Electronically Signed   By: Staci Righter M.D.   On: 12/13/2017 20:22   Dg Chest Portable 1 View  Result Date: 12/13/2017 CLINICAL DATA:  Status post fall.  Possible aspiration. EXAM: PORTABLE CHEST 1 VIEW COMPARISON:  11/09/2017 FINDINGS: Femoral approach dialysis catheter overlies the expected location of right atrium. Cardiomediastinal silhouette is normal. Mediastinal contours appear intact. Chronic elevation of the right hemidiaphragm. There is no evidence of focal airspace consolidation, pleural effusion or pneumothorax. Right lower lobe atelectatic changes. Osseous structures are without acute abnormality. Soft tissues are grossly normal. IMPRESSION: Chronic elevation of the right hemidiaphragm with right lower lobe atelectatic changes. Electronically Signed   By: Fidela Salisbury M.D.   On: 12/13/2017 19:59    ROS: All others negative except those listed in HPI.   Physical Exam: Vitals:   12/14/17 0130 12/14/17 0200 12/14/17 0215 12/14/17 0700  BP: (!) 158/84 (!) 161/72 (!) 161/72   Pulse: 74 72 72   Resp: 16 11 10 14   Temp:  98.1 F (36.7 C)  97.8 F (36.6 C)  TempSrc:  Oral  Oral  SpO2: 100% 100% 100%   Weight:  69 kg (152 lb 1.9 oz)    Height:  5\' 6"  (1.676 m)       General:  NAD, Pleasant, chronically ill appearing, elderly female Head: NCAT sclera not icteric Neck: Supple. No lymphadenopathy. No JVD Lungs: CTA bilaterally. No wheeze, rales or rhonchi. Breathing is unlabored. Heart: RRR. +4/1  systolic murmur No rubs or gallops.  Abdomen: soft, nontender, +BS, no guarding, no rebound tenderness Lower extremities:L AKA - no stump edema, R TMA - 1+ edema Neuro: A&Ox3. Moves all extremities  spontaneously. Psych:  Responds to questions appropriately with a normal affect. Dialysis Access: R thigh TDC   Dialysis Orders:  [facility] 4hrs 400/800 EDW 66.5kg, 2K/2Ca Profile 4, Linear Na R thigh TDC  Hectorol 71mcg IV qHD Heparin 5000 unit bolus qHD  Assessment/Plan: 1.  Syncope: ECHO ordered. Cardio following  2. ESRD -  TTS, continue per regular schedule. Orders written. K 5.1 3.  Hypertension/volume - BP elevated, should improve post HD. 3kg over EDW, titrate down as tolerated.  4.  Anemia  - Hgb 11.2. No indication for ESA. 5.  Secondary Hyperparathyroidism -  Ca in goal, P low. Hold binders. Continue VDRA. 6.  Nutrition - Renal diet, Nepro TID 7. Seizures - per primary 8. Multiple CVA - CT unremarkable, per primary 9. CHF - per cardio 10. Depression - per primary   Jen Mow, PA-C [facility] Pager: [phone] 12/14/2017, 1:03 PM

## 2017-12-14 NOTE — Procedures (Signed)
Tol HD, BFR 300cc, goal 2500cc. BP 141/72 Deborah Hendricks

## 2017-12-14 NOTE — ED Notes (Signed)
Report given to derrick rn

## 2017-12-14 NOTE — ED Notes (Signed)
The pt reports that she is tired of waaiting for the other doctro to see her she wants to go to bed

## 2017-12-14 NOTE — Progress Notes (Signed)
Advanced Heart Failure Rounding Note   Subjective:    No further syncope overnight. Rhythm stable on tele without pauses. Carvedilol held.   Feels good. No CP, SORB or dizziness. SBP 130-160   Objective:   Weight Range:  Vital Signs:   Temp:  [97.6 F (36.4 C)-98.1 F (36.7 C)] 97.8 F (36.6 C) (02/02 0700) Pulse Rate:  [72-80] 72 (02/02 0215) Resp:  [0-16] 14 (02/02 0700) BP: (136-164)/(58-99) 161/72 (02/02 0215) SpO2:  [99 %-100 %] 100 % (02/02 0215) Weight:  [69 kg (152 lb 1.9 oz)-70.8 kg (156 lb)] 69 kg (152 lb 1.9 oz) (02/02 0200) Last BM Date: 12/14/17  Weight change: Filed Weights   12/13/17 1926 12/14/17 0200  Weight: 70.8 kg (156 lb) 69 kg (152 lb 1.9 oz)    Intake/Output:  No intake or output data in the 24 hours ending 12/14/17 1501   Physical Exam: General:  Elderly woman lying in bed . No resp difficulty HEENT: normal Neck: supple. JVP 7 . Carotids 2+ bilat; no bruits. No lymphadenopathy or thryomegaly appreciated. Cor: PMI nondisplaced. Regular rate & rhythm. 2/6 AS murmur Lungs: clear Abdomen: soft, nontender, nondistended. No hepatosplenomegaly. No bruits or masses. Good bowel sounds. Extremities: no cyanosis, clubbing, rash, edema  S/p L AKA . R TMA Neuro: alert & orientedx3, cranial nerves grossly intact. moves all 4 extremities w/o difficulty. Affect pleasant  Telemetry: NSR 60-70 with 1AVB no bradycardia or syncope   ECG: NSR 1AVB (280 ms) IVC (QRS 196ms)   Labs: Basic Metabolic Panel: Recent Labs  Lab 12/13/17 1944 12/14/17 0201  NA 136 136  K 5.0 5.1  CL 99* 100*  CO2 23 23  GLUCOSE 152* 134*  BUN 54* 57*  CREATININE 7.36* 7.48*  CALCIUM 9.5 9.3  MG 2.8* 2.7*  PHOS  --  2.4*    Liver Function Tests: Recent Labs  Lab 12/14/17 0201  ALBUMIN 3.4*   No results for input(s): LIPASE, AMYLASE in the last 168 hours. No results for input(s): AMMONIA in the last 168 hours.  CBC: Recent Labs  Lab 12/13/17 1944  12/14/17 0201  WBC 6.9 9.4  NEUTROABS 4.2  --   HGB 11.8* 11.2*  HCT 37.5 36.0  MCV 89.3 89.6  PLT 197 175    Cardiac Enzymes: Recent Labs  Lab 12/14/17 0201 12/14/17 0752  TROPONINI <0.03 <0.03    BNP: BNP (last 3 results) No results for input(s): BNP in the last 8760 hours.  ProBNP (last 3 results) No results for input(s): PROBNP in the last 8760 hours.    Other results:  Imaging: Ct Head Wo Contrast  Result Date: 12/13/2017 CLINICAL DATA:  Found on floor. EXAM: CT HEAD WITHOUT CONTRAST TECHNIQUE: Contiguous axial images were obtained from the base of the skull through the vertex without intravenous contrast. COMPARISON:  11/12/2017. FINDINGS: Brain: No evidence for acute infarction, hemorrhage, mass lesion, hydrocephalus, or extra-axial fluid. Advanced atrophy, with multiple old infarcts, noted on prior scan, greater on the LEFT. Chronic microvascular ischemic change with hypoattenuation of a moderate to advanced nature. Vascular: Calcification of the cavernous internal carotid arteries consistent with cerebrovascular atherosclerotic disease. No signs of intracranial large vessel occlusion. Skull: Normal. Negative for fracture or focal lesion. Sinuses/Orbits: Mild mucosal thickening but no significant opacity or layering fluid. BILATERAL cataract extraction. Other: None. IMPRESSION: Advanced atrophy and small vessel disease. No acute intracranial findings. Similar appearance to most recent priors. Electronically Signed   By: Staci Righter M.D.   On:  12/13/2017 20:22   Dg Chest Portable 1 View  Result Date: 12/13/2017 CLINICAL DATA:  Status post fall.  Possible aspiration. EXAM: PORTABLE CHEST 1 VIEW COMPARISON:  11/09/2017 FINDINGS: Femoral approach dialysis catheter overlies the expected location of right atrium. Cardiomediastinal silhouette is normal. Mediastinal contours appear intact. Chronic elevation of the right hemidiaphragm. There is no evidence of focal airspace  consolidation, pleural effusion or pneumothorax. Right lower lobe atelectatic changes. Osseous structures are without acute abnormality. Soft tissues are grossly normal. IMPRESSION: Chronic elevation of the right hemidiaphragm with right lower lobe atelectatic changes. Electronically Signed   By: Fidela Salisbury M.D.   On: 12/13/2017 19:59      Medications:     Scheduled Medications: . amLODipine  10 mg Oral Daily  . aspirin EC  81 mg Oral Daily  . atorvastatin  40 mg Oral Daily  . doxercalciferol  1 mcg Intravenous Q T,Th,Sa-HD  . heparin  5,000 Units Subcutaneous Q8H  . hydrALAZINE  10 mg Oral Q8H  . insulin aspart  0-9 Units Subcutaneous TID WC  . levETIRAcetam  500 mg Oral BID  . pantoprazole (PROTONIX) IV  40 mg Intravenous Q12H  . sucralfate  1 g Oral TID WC & HS     Infusions:   PRN Medications:  acetaminophen, ondansetron (ZOFRAN) IV   Assessment:   Ms. Adsit is a delightful 72 yo woman with PMH PVD s/p left AKA and right TMA, ESRD on HD T/Th/Sat, DM2, hypertension, hyperlipidemia, atrial fibrillation not on anticoagulation, CVAx2, GERD, OSA not on CPAP who is seen in consult at the request of Dr. Sherry Ruffing for syncope and concern for heart block as cause.  We unfortunately do not have strips during the 6 second pause. She does have a period of time captured on EMS strips with bradycardia to the 30-40 bpm range, but the baseline makes it difficult to interpret the p-wave pattern. It is also unknown what was occurring at the time, IE was she retching/straining etc that might have affected her vagal tone.   Plan/Discussion:    1. Syncope - reported 6-second pause with EMS (no strip available) - while she does have underlying conduction disease, suspect episode was vagally-mediated. Rhythm now stable. No recurrence - would continue to hold carvedilol (hold all AV node blockers) - if event recurs can consdier 30-day event monitor   2. ESRD - per Renal   3.  PAD - stable   4. PAF - not on AC due to h/o falls  5. Mild AS - echo 9/17 mean gradient 74mmHG. Will repeat  6. HTN - mildly elevated.    Stable from our perspective. Await results of echo. Watch for 24 more hours on tele and if no further events can go back to SNF. We will follow at a distance on tele. Call if questions.   Length of Stay: 0   Glori Bickers 12/14/2017, 3:01 PM  Advanced Heart Failure Team Pager [phone] (M-F; 7a - 4p)  Please contact [facility] for night-coverage after hours (4p -7a ) and weekends on [email or web address]

## 2017-12-14 NOTE — H&P (Addendum)
History and Physical    Deborah Hendricks YDX:[id number] DOB: 03/12/1946 DOA: 12/13/2017  Referring MD/NP/PA: Dr. Legrand Como Dean(resident) PCP: [name], MD  Patient coming from: [facility] via EMS  Chief Complaint: Syncope  I have personally briefly reviewed patient's old medical records in [place]   HPI: Deborah Hendricks is a 72 y.o. female with medical history significant of ESRD on HD, HTN, HLD, DM type II, H/O CVA with associated seizure disorder, A. fib not on anticoagulants, PVD status post left AKA, diastolic CHF; who presented after having a possible syncopal episode.  Patient recalls sitting at the edge of her bed eating her dinner and complaining of feeling sick.  Thereafter, was found on the floor of her room with emesis present nearby.  En route with EMS patient was noted to become very bradycardic with 6-second pause reported and concern for third-degree heart block.  Patient continues to report feeling sick as though she may vomit.  She had just been recently admitted to the hospital on 11/09/2017 for hyperkalemia.   ED Course:  Dr. Al Pimple cardiology was consulted recommending holding Coreg and admission to hospitalist service.  Vital signs appeared relatively stable at this time.  Labs revealed hemoglobin 11.8, potassium 5, BUN 54, and creatinine 7.36.  Chest x-ray showing chronically right elevated hemidiaphragm and right lower lung atelectasis.  Review of Systems  Constitutional: Positive for malaise/fatigue. Negative for chills and fever.  HENT: Negative for ear pain and tinnitus.   Eyes: Negative for photophobia and discharge.  Respiratory: Positive for cough. Negative for shortness of breath.   Cardiovascular: Negative for orthopnea and claudication.  Gastrointestinal: Positive for nausea and vomiting. Negative for blood in stool.  Genitourinary: Negative for frequency.  Musculoskeletal: Positive for falls.  Skin: Negative for itching and  rash.  Neurological: Positive for loss of consciousness. Negative for focal weakness.  Psychiatric/Behavioral: Negative for hallucinations and substance abuse.    Past Medical History:  Diagnosis Date  . Anemia   . Anxiety   . CHF (congestive heart failure) ([place])   . Constipation   . Critical lower limb ischemia   . Depression   . ESRD on dialysis [facility])    "TTS; [place]" (11/27/2016)  . GERD (gastroesophageal reflux disease)   . Heart murmur   . HLD (hyperlipidemia)   . Hypertension   . Memory loss   . Peripheral vascular disease ([place])   . Refusal of blood transfusions as patient is Jehovah's Witness   . Seizures ([place]) 04/2015   s/p CVA.  [name] Sleep apnea    Noncompliant with CPAP.  [name] Stroke ([place]) 2015  . Type II diabetes mellitus ([place])     Past Surgical History:  Procedure Laterality Date  . ABDOMINAL AORTOGRAM W/LOWER EXTREMITY Right 06/13/2017   Procedure: Abdominal Aortogram w/Lower Extremity;  Surgeon: Waynetta Sandy, MD;  Location: [facility];  Service: Cardiovascular;  Laterality: Right;  . ABDOMINAL HYSTERECTOMY    . AMPUTATION Left 12/15/2015   Procedure: LEFT ABOVE KNEE AMPUTATION;  Surgeon: Serafina Mitchell, MD;  Location: [facility];  Service: Vascular;  Laterality: Left;  . AMPUTATION Right 06/19/2017   Procedure: AMPUTATION right great toe;  Surgeon: Serafina Mitchell, MD;  Location: [facility];  Service: Vascular;  Laterality: Right;  . BASCILIC VEIN TRANSPOSITION Right 11/25/2015   Procedure: Right Arm FIRST STAGE BASILIC VEIN TRANSPOSITION;  Surgeon: Serafina Mitchell, MD;  Location: [facility];  Service: Vascular;  Laterality: Right;  .  BASCILIC VEIN TRANSPOSITION Right 01/19/2016   Procedure: RIGHT SECOND STAGE BASILIC VEIN TRANSPOSITION;  Surgeon: Serafina Mitchell, MD;  Location: [facility];  Service: Vascular;  Laterality: Right;  . DILATION AND CURETTAGE OF UTERUS    . EYE SURGERY    . IR FLUORO GUIDE CV LINE RIGHT  06/05/2017  . PERIPHERAL VASCULAR BALLOON  ANGIOPLASTY Right 06/13/2017   Procedure: PERIPHERAL VASCULAR BALLOON ANGIOPLASTY;  Surgeon: Waynetta Sandy, MD;  Location: [facility];  Service: Cardiovascular;  Laterality: Right;  POPLITEAL/SFA/PERONEAL  . PERIPHERAL VASCULAR CATHETERIZATION Bilateral 11/28/2015   Procedure: Lower Extremity Angiography;  Surgeon: Lorretta Harp, MD;  Location: [facility];  Service: Cardiovascular;  Laterality: Bilateral;  . PERIPHERAL VASCULAR CATHETERIZATION N/A 11/28/2015   Procedure: Abdominal Aortogram;  Surgeon: Lorretta Harp, MD;  Location: [facility];  Service: Cardiovascular;  Laterality: N/A;  . PERIPHERAL VASCULAR CATHETERIZATION Right 11/27/2016  . PERIPHERAL VASCULAR CATHETERIZATION N/A 11/27/2016   Procedure: Abdominal Aortogram w/Lower Extremity;  Surgeon: Serafina Mitchell, MD;  Location: [facility];  Service: Cardiovascular;  Laterality: N/A;  . PERIPHERAL VASCULAR CATHETERIZATION N/A 11/27/2016   Procedure: Right arm and central venogram;  Surgeon: Serafina Mitchell, MD;  Location: [facility];  Service: Cardiovascular;  Laterality: N/A;  . PERIPHERAL VASCULAR CATHETERIZATION Right 11/27/2016   Procedure: Peripheral Vascular Atherectomy;  Surgeon: Serafina Mitchell, MD;  Location: [facility];  Service: Cardiovascular;  Laterality: Right;  Rt    sfa, popliteal, tibioperoneal trunk, peroneal  . PERIPHERAL VASCULAR CATHETERIZATION Right 11/27/2016   Procedure: Peripheral Vascular Balloon Angioplasty;  Surgeon: Serafina Mitchell, MD;  Location: [facility];  Service: Cardiovascular;  Laterality: Right;  Tibial peroneal  . RETINOPATHY SURGERY Bilateral   . TOE AMPUTATION Right    2nd, 3rd, 4th, 5th digits  . TONSILLECTOMY       reports that she quit smoking about 2 years ago. Her smoking use included cigarettes. She has a 12.75 pack-year smoking history. she has never used smokeless tobacco. She reports that she drinks alcohol. She reports that  she does not use drugs.  Allergies  Allergen Reactions  . Penicillins Hives, Itching, Rash and Other (See Comments)    Has patient had a PCN reaction causing immediate rash, facial/tongue/throat swelling, SOB or lightheadedness with hypotension: Yes Has patient had a PCN reaction causing severe rash involving mucus membranes or skin necrosis:unknown MAR source Has patient had a PCN reaction that required hospitalization:unknown MAR source Has patient had a PCN reaction occurring within the last 10 years:unknown MAR source If all of the above answers are "NO", then may proceed with Cephalosporin use.  . Sulfa Antibiotics     UNSPECIFIED REACTION   . Amoxicillin Hives, Itching and Rash    Has patient had a PCN reaction causing immediate rash, facial/tongue/throat swelling, SOB or lightheadedness with hypotension:unknown MAR source Has patient had a PCN reaction causing severe rash involving mucus membranes or skin necrosis:unknown MAR source Has patient had a PCN reaction that required hospitalization:unknown MAR source Has patient had a PCN reaction occurring within the last 10 years: unknown MAR source If all of the above answers are "NO", then may proceed with Cephalosporin use.   . Latex Rash    Family History  Problem Relation Age of Onset  . Asthma Mother   . Cancer Sister     Prior to Admission medications   Medication Sig Start Date End Date Taking? Authorizing  Provider  Amino Acids-Protein Hydrolys (FEEDING SUPPLEMENT, PRO-STAT SUGAR FREE 64,) LIQD Take 30 mLs by mouth 2 (two) times daily. (0900 & 2100) 06/14/17  Yes Virgina Jock A, PA-C  amLODipine (NORVASC) 10 MG tablet Take 1 tablet (10 mg total) by mouth daily. 11/16/17  Yes Danford, Suann Larry, MD  aspirin EC 81 MG tablet Take 81 mg by mouth daily.   Yes [provider]  atorvastatin (LIPITOR) 40 MG tablet Take 1 tablet (40 mg total) by mouth daily. 03/07/17  Yes Briscoe Deutscher, DO  calcium acetate (PHOSLO) 667  MG capsule Take 2,001 mg by mouth 3 (three) times daily with meals.    Yes [provider]  carvedilol (COREG) 3.125 MG tablet Take 1 tablet (3.125 mg total) by mouth 2 (two) times daily with a meal. 07/30/16  Yes Reyne Dumas, MD  citalopram (CELEXA) 10 MG tablet Take 10 mg by mouth daily. (0900) 09/27/16  Yes [provider]  esomeprazole (NEXIUM) 20 MG capsule Take 1 capsule (20 mg total) by mouth daily. 03/11/17  Yes Briscoe Deutscher, DO  gabapentin (NEURONTIN) 100 MG capsule Take 1 capsule (100 mg total) by mouth at bedtime. 06/20/17  Yes Virgina Jock A, PA-C  hydrALAZINE (APRESOLINE) 10 MG tablet Take 1 tablet (10 mg total) by mouth every 8 (eight) hours. 11/15/17  Yes Danford, Suann Larry, MD  insulin aspart (NOVOLOG) 100 UNIT/ML injection Inject 2-10 Units into the skin 3 (three) times daily as needed for high blood sugar. 151-200=2 units, 201-250=4 units, 251-300=6 units, 301-350=8 units, 351-400=10 units, >400=10 units and Call MD   Yes [provider]  levETIRAcetam (KEPPRA) 500 MG tablet Take 1 tablet (500 mg total) by mouth 2 (two) times daily. 11/15/17  Yes Danford, Suann Larry, MD  Multiple Vitamin (MULTIVITAMIN) tablet Take 1 tablet by mouth daily.   Yes [provider]  Nutritional Supplements (FEEDING SUPPLEMENT, NEPRO CARB STEADY,) LIQD Take 237 mLs by mouth daily with lunch. 06/20/17  Yes Virgina Jock A, PA-C  ondansetron (ZOFRAN ODT) 4 MG disintegrating tablet Take 1 tablet (4 mg total) by mouth every 8 (eight) hours as needed for nausea or vomiting. 08/30/17  Yes Schlossman, Junie Panning, MD  polyethylene glycol powder (GLYCOLAX/MIRALAX) powder Take 17 g by mouth at bedtime. HOLD FOR LOOSE STOOLS    Yes [provider]  psyllium (REGULOID) 0.52 g capsule One po tid prn constipation. Patient taking differently: Take 0.52 g by mouth 3 (three) times daily as needed (for constipation.).  03/22/17  Yes Briscoe Deutscher, DO    Physical  Exam:  Constitutional: Elderly female who appears to be uncomfortable. Vitals:   12/13/17 2315 12/13/17 2345 12/14/17 0000 12/14/17 0030  BP: (!) 152/70 (!) 143/64 (!) 146/58 (!) 136/99  Pulse: 72 74 73 73  Resp: 10 13 10 14   Temp:      TempSrc:      SpO2: 100% 100% 100% 99%  Weight:      Height:       Eyes: PERRL, lids and conjunctivae normal ENMT: Mucous membranes are dry. Posterior pharynx clear of any exudate or lesions. Neck: normal, supple, no masses, no thyromegaly Respiratory: Normal respiratory effort with decreased overall aeration. Cardiovascular: Regular rate and rhythm, positive systolic murmur present. No rubs / gallops.  Trace lower extremity edema. 1+ pedal pulses. No carotid bruits.  Abdomen: no tenderness, no masses palpated. No hepatosplenomegaly. Bowel sounds positive.  Musculoskeletal: no clubbing / cyanosis.  Left AKA Skin: no rashes, lesions, ulcers. No induration Neurologic:  CN 2-12 grossly intact. Sensation intact, DTR normal. Strength 5/5 in all 4.  Psychiatric: Normal judgment and insight. Alert and oriented x 3. Normal mood.     Labs on Admission: I have personally reviewed following labs and imaging studies  CBC: Recent Labs  Lab 12/13/17 1944  WBC 6.9  NEUTROABS 4.2  HGB 11.8*  HCT 37.5  MCV 89.3  PLT 073   Basic Metabolic Panel: Recent Labs  Lab 12/13/17 1944  NA 136  K 5.0  CL 99*  CO2 23  GLUCOSE 152*  BUN 54*  CREATININE 7.36*  CALCIUM 9.5  MG 2.8*   GFR: Estimated Creatinine Clearance: 6.1 mL/min (A) (by C-G formula based on SCr of 7.36 mg/dL (H)). Liver Function Tests: No results for input(s): AST, ALT, ALKPHOS, BILITOT, PROT, ALBUMIN in the last 168 hours. No results for input(s): LIPASE, AMYLASE in the last 168 hours. No results for input(s): AMMONIA in the last 168 hours. Coagulation Profile: Recent Labs  Lab 12/13/17 1944  INR 1.16   Cardiac Enzymes: No results for input(s): CKTOTAL, CKMB, CKMBINDEX, TROPONINI  in the last 168 hours. BNP (last 3 results) No results for input(s): PROBNP in the last 8760 hours. HbA1C: No results for input(s): HGBA1C in the last 72 hours. CBG: Recent Labs  Lab 12/13/17 1931  GLUCAP 144*   Lipid Profile: No results for input(s): CHOL, HDL, LDLCALC, TRIG, CHOLHDL, LDLDIRECT in the last 72 hours. Thyroid Function Tests: Recent Labs    12/13/17 1938  TSH 2.139   Anemia Panel: No results for input(s): VITAMINB12, FOLATE, FERRITIN, TIBC, IRON, RETICCTPCT in the last 72 hours. Urine analysis:    Component Value Date/Time   COLORURINE YELLOW 01/28/2014 1129   APPEARANCEUR CLEAR 01/28/2014 1129   LABSPEC 1.012 01/28/2014 1129   PHURINE 6.0 01/28/2014 1129   GLUCOSEU NEGATIVE 01/28/2014 1129   HGBUR NEGATIVE 01/28/2014 1129   BILIRUBINUR NEGATIVE 01/28/2014 1129   KETONESUR NEGATIVE 01/28/2014 1129   PROTEINUR 100 (A) 01/28/2014 1129   UROBILINOGEN 0.2 01/28/2014 1129   NITRITE NEGATIVE 01/28/2014 1129   LEUKOCYTESUR TRACE (A) 01/28/2014 1129   Sepsis Labs: No results found for this or any previous visit (from the past 240 hour(s)).   Radiological Exams on Admission: Ct Head Wo Contrast  Result Date: 12/13/2017 CLINICAL DATA:  Found on floor. EXAM: CT HEAD WITHOUT CONTRAST TECHNIQUE: Contiguous axial images were obtained from the base of the skull through the vertex without intravenous contrast. COMPARISON:  11/12/2017. FINDINGS: Brain: No evidence for acute infarction, hemorrhage, mass lesion, hydrocephalus, or extra-axial fluid. Advanced atrophy, with multiple old infarcts, noted on prior scan, greater on the LEFT. Chronic microvascular ischemic change with hypoattenuation of a moderate to advanced nature. Vascular: Calcification of the cavernous internal carotid arteries consistent with cerebrovascular atherosclerotic disease. No signs of intracranial large vessel occlusion. Skull: Normal. Negative for fracture or focal lesion. Sinuses/Orbits: Mild mucosal  thickening but no significant opacity or layering fluid. BILATERAL cataract extraction. Other: None. IMPRESSION: Advanced atrophy and small vessel disease. No acute intracranial findings. Similar appearance to most recent priors. Electronically Signed   By: Staci Righter M.D.   On: 12/13/2017 20:22   Dg Chest Portable 1 View  Result Date: 12/13/2017 CLINICAL DATA:  Status post fall.  Possible aspiration. EXAM: PORTABLE CHEST 1 VIEW COMPARISON:  11/09/2017 FINDINGS: Femoral approach dialysis catheter overlies the expected location of right atrium. Cardiomediastinal silhouette is normal. Mediastinal contours appear intact. Chronic elevation of the right hemidiaphragm. There is no evidence of  focal airspace consolidation, pleural effusion or pneumothorax. Right lower lobe atelectatic changes. Osseous structures are without acute abnormality. Soft tissues are grossly normal. IMPRESSION: Chronic elevation of the right hemidiaphragm with right lower lobe atelectatic changes. Electronically Signed   By: Fidela Salisbury M.D.   On: 12/13/2017 19:59    EKG: Independently reviewed.  Normal sinus rhythm with first-degree heart block and PVCs/artifact QTC and initially noted to be 475  Assessment/Plan Syncope and collapse, question third-degree heart block: Acute.  Patient found on the floor question possible arrhythmia ( third-degree heart block) as cause.  Initial EKG showing first-degree heart block - Admit to a step down bed - Pacer pads to bedside - Check echocardiogram - Appreciate cardiology consultative services of Dr. Al Pimple   First-degree heart block - Follow-up telemetry  Nausea and vomiting:  - Zofran IV prn q 8hrs N/V - monitor QTc    ESRD on HD: Patient normally dialyzes T/TH/Sat.  Initial labs revealed potassium 5, BUN 54, creatinine 7.36, magnesium 2.8, and calcium 9.5. - Message left for nephrology for need of HD in a.m. - Check renal function panel in a.m.  Anemia of  chronic disease: Hemoglobin noted to be 11.8 which appears similar to hemoglobin from 1/3. - Continue to monitor  Essential hypertension - Hold Coreg - Continue hydralazine  PVD, s/p left AKA - Continue aspirin  Diabetes mellitus type 2 - Hypoglycemic protocol - CBGs q. before meals with sensitive SSI  H/O CVA seizure disorder: - Continue Keppra - Seizure precautions  Depression - Continue citalopram  DVT prophylaxis: heparin Code Status: DNR Family Communication: No family present at bedside Disposition Plan: TBD  Consults called: Cardiology  Admission status: observation   Norval Morton MD [facility] Pager [phone]   If 7PM-7AM, please contact night-coverage [email or web address] Password [email or web address]  12/14/2017, 1:34 AM

## 2017-12-14 NOTE — Consult Note (Signed)
[facility] Renal Consultation Note    Indication for Consultation:  Management of ESRD/hemodialysis; anemia, hypertension/volume and secondary hyperparathyroidism  QPY:[id number], Jori Moll, MD  HPI: Deborah Hendricks is an ESRD patient who has dialysis on TTS at [facility].  PMH significant for HTN, CHF, depression, multiple CVA, and h/o seizures.  She presented to the ED after an episode of LOC with vomiting at the nursing facility where she lives.  EMS reported patient being bradycardic with a 6 sec pause while in transport to the hospital.  Labs in the ED are unremarkable, EKG showed 1st degree HB, CXR shows possible vascular congestion.   Seen and examined at bedside.  States she was eating lunch yesterday and she passed out.  Reports no warning or associated symptoms. Her major concern is "screaming fits" she experiences at dialysis, claims they mostly occur there and she can not control them. Denies current n/v/d, abdominal pain, SOB, chest pain, dizziness, syncope, and edema. She has been admitted for observation due to syncope, collapse and concern for 3rd degree heart block.  Consulted for dialysis.  Of note patient is historically non compliant with dialysis and recently is not reaching her EDW.  She missed 2 treatments this past week and signed off 1.5hrs early at the one she attended.   Past Medical History:  Diagnosis Date  . Anemia   . Anxiety   . CHF (congestive heart failure) ([place])   . Constipation   . Critical lower limb ischemia   . Depression   . ESRD on dialysis [facility])    "TTS; [place]" (11/27/2016)  . GERD (gastroesophageal reflux disease)   . Heart murmur   . HLD (hyperlipidemia)   . Hypertension   . Memory loss   . Peripheral vascular disease ([place])   . Refusal of blood transfusions as patient is Jehovah's Witness   . Seizures ([place]) 04/2015   s/p CVA.  [name] Sleep apnea    Noncompliant with CPAP.  [name] Stroke ([place]) 2015  . Type II diabetes mellitus ([place])     Past Surgical History:  Procedure Laterality Date  . ABDOMINAL AORTOGRAM W/LOWER EXTREMITY Right 06/13/2017   Procedure: Abdominal Aortogram w/Lower Extremity;  Surgeon: Waynetta Sandy, MD;  Location: [employer] [facility] [employer] CV LAB;  Service: Cardiovascular;  Laterality: Right;  . ABDOMINAL HYSTERECTOMY    . AMPUTATION Left 12/15/2015   Procedure: LEFT ABOVE KNEE AMPUTATION;  Surgeon: Serafina Mitchell, MD;  Location: [place];  Service: Vascular;  Laterality: Left;  . AMPUTATION Right 06/19/2017   Procedure: AMPUTATION right great toe;  Surgeon: Serafina Mitchell, MD;  Location: [place];  Service: Vascular;  Laterality: Right;  . BASCILIC VEIN TRANSPOSITION Right 11/25/2015   Procedure: Right Arm FIRST STAGE BASILIC VEIN TRANSPOSITION;  Surgeon: Serafina Mitchell, MD;  Location: [place];  Service: Vascular;  Laterality: Right;  . BASCILIC VEIN TRANSPOSITION Right 01/19/2016   Procedure: RIGHT SECOND STAGE BASILIC VEIN TRANSPOSITION;  Surgeon: Serafina Mitchell, MD;  Location: [place];  Service: Vascular;  Laterality: Right;  . DILATION AND CURETTAGE OF UTERUS    . EYE SURGERY    . IR FLUORO GUIDE CV LINE RIGHT  06/05/2017  . PERIPHERAL VASCULAR BALLOON ANGIOPLASTY Right 06/13/2017   Procedure: PERIPHERAL VASCULAR BALLOON ANGIOPLASTY;  Surgeon: Waynetta Sandy, MD;  Location: [place] CV LAB;  Service: Cardiovascular;  Laterality: Right;  POPLITEAL/SFA/PERONEAL  . PERIPHERAL VASCULAR CATHETERIZATION Bilateral 11/28/2015   Procedure: Lower Extremity Angiography;  Surgeon: Lorretta Harp, MD;  Location: [place] CV LAB;  Service: Cardiovascular;  Laterality: Bilateral;  . PERIPHERAL VASCULAR CATHETERIZATION N/A 11/28/2015   Procedure: Abdominal Aortogram;  Surgeon: Lorretta Harp, MD;  Location: [place] CV LAB;  Service: Cardiovascular;  Laterality: N/A;  . PERIPHERAL VASCULAR CATHETERIZATION Right 11/27/2016  . PERIPHERAL VASCULAR CATHETERIZATION N/A 11/27/2016   Procedure: Abdominal Aortogram  w/Lower Extremity;  Surgeon: Serafina Mitchell, MD;  Location: [place] CV LAB;  Service: Cardiovascular;  Laterality: N/A;  . PERIPHERAL VASCULAR CATHETERIZATION N/A 11/27/2016   Procedure: Right arm and central venogram;  Surgeon: Serafina Mitchell, MD;  Location: [place] CV LAB;  Service: Cardiovascular;  Laterality: N/A;  . PERIPHERAL VASCULAR CATHETERIZATION Right 11/27/2016   Procedure: Peripheral Vascular Atherectomy;  Surgeon: Serafina Mitchell, MD;  Location: [place] CV LAB;  Service: Cardiovascular;  Laterality: Right;  Rt    sfa, popliteal, tibioperoneal trunk, peroneal  . PERIPHERAL VASCULAR CATHETERIZATION Right 11/27/2016   Procedure: Peripheral Vascular Balloon Angioplasty;  Surgeon: Serafina Mitchell, MD;  Location: [place] CV LAB;  Service: Cardiovascular;  Laterality: Right;  Tibial peroneal  . RETINOPATHY SURGERY Bilateral   . TOE AMPUTATION Right    2nd, 3rd, 4th, 5th digits  . TONSILLECTOMY     Family History  Problem Relation Age of Onset  . Asthma Mother   . Cancer Sister    Social History:  reports that she quit smoking about 2 years ago. Her smoking use included cigarettes. She has a 12.75 pack-year smoking history. she has never used smokeless tobacco. She reports that she drinks alcohol. She reports that she does not use drugs. Allergies  Allergen Reactions  . Penicillins Hives, Itching, Rash and Other (See Comments)    Has patient had a PCN reaction causing immediate rash, facial/tongue/throat swelling, SOB or lightheadedness with hypotension: Yes Has patient had a PCN reaction causing severe rash involving mucus membranes or skin necrosis:unknown MAR source Has patient had a PCN reaction that required hospitalization:unknown MAR source Has patient had a PCN reaction occurring within the last 10 years:unknown MAR source If all of the above answers are "NO", then may proceed with Cephalosporin use.  . Sulfa Antibiotics     UNSPECIFIED REACTION   .  Amoxicillin Hives, Itching and Rash    Has patient had a PCN reaction causing immediate rash, facial/tongue/throat swelling, SOB or lightheadedness with hypotension:unknown MAR source Has patient had a PCN reaction causing severe rash involving mucus membranes or skin necrosis:unknown MAR source Has patient had a PCN reaction that required hospitalization:unknown MAR source Has patient had a PCN reaction occurring within the last 10 years: unknown MAR source If all of the above answers are "NO", then may proceed with Cephalosporin use.   . Latex Rash   Prior to Admission medications   Medication Sig Start Date End Date Taking? Authorizing Provider  Amino Acids-Protein Hydrolys (FEEDING SUPPLEMENT, PRO-STAT SUGAR FREE 64,) LIQD Take 30 mLs by mouth 2 (two) times daily. (0900 & 2100) 06/14/17  Yes Virgina Jock A, PA-C  amLODipine (NORVASC) 10 MG tablet Take 1 tablet (10 mg total) by mouth daily. 11/16/17  Yes Danford, Suann Larry, MD  aspirin EC 81 MG tablet Take 81 mg by mouth daily.   Yes [provider]  atorvastatin (LIPITOR) 40 MG tablet Take 1 tablet (40 mg total) by mouth daily. 03/07/17  Yes Briscoe Deutscher, DO  calcium acetate (PHOSLO) 667 MG capsule Take 2,001 mg by mouth 3 (three) times daily with meals.  Yes [provider]  carvedilol (COREG) 3.125 MG tablet Take 1 tablet (3.125 mg total) by mouth 2 (two) times daily with a meal. 07/30/16  Yes Reyne Dumas, MD  citalopram (CELEXA) 10 MG tablet Take 10 mg by mouth daily. (0900) 09/27/16  Yes [provider]  esomeprazole (NEXIUM) 20 MG capsule Take 1 capsule (20 mg total) by mouth daily. 03/11/17  Yes Briscoe Deutscher, DO  gabapentin (NEURONTIN) 100 MG capsule Take 1 capsule (100 mg total) by mouth at bedtime. 06/20/17  Yes Virgina Jock A, PA-C  hydrALAZINE (APRESOLINE) 10 MG tablet Take 1 tablet (10 mg total) by mouth every 8 (eight) hours. 11/15/17  Yes Danford, Suann Larry, MD  insulin aspart (NOVOLOG) 100  UNIT/ML injection Inject 2-10 Units into the skin 3 (three) times daily as needed for high blood sugar. 151-200=2 units, 201-250=4 units, 251-300=6 units, 301-350=8 units, 351-400=10 units, >400=10 units and Call MD   Yes [provider]  levETIRAcetam (KEPPRA) 500 MG tablet Take 1 tablet (500 mg total) by mouth 2 (two) times daily. 11/15/17  Yes Danford, Suann Larry, MD  Multiple Vitamin (MULTIVITAMIN) tablet Take 1 tablet by mouth daily.   Yes [provider]  Nutritional Supplements (FEEDING SUPPLEMENT, NEPRO CARB STEADY,) LIQD Take 237 mLs by mouth daily with lunch. 06/20/17  Yes Virgina Jock A, PA-C  ondansetron (ZOFRAN ODT) 4 MG disintegrating tablet Take 1 tablet (4 mg total) by mouth every 8 (eight) hours as needed for nausea or vomiting. 08/30/17  Yes Schlossman, Junie Panning, MD  polyethylene glycol powder (GLYCOLAX/MIRALAX) powder Take 17 g by mouth at bedtime. HOLD FOR LOOSE STOOLS    Yes [provider]  psyllium (REGULOID) 0.52 g capsule One po tid prn constipation. Patient taking differently: Take 0.52 g by mouth 3 (three) times daily as needed (for constipation.).  03/22/17  Yes Briscoe Deutscher, DO   Current Facility-Administered Medications  Medication Dose Route Frequency Provider Last Rate Last Dose  . acetaminophen (TYLENOL) tablet 650 mg  650 mg Oral Q6H PRN Lovey Newcomer T, NP   650 mg at 12/14/17 0439  . amLODipine (NORVASC) tablet 10 mg  10 mg Oral Daily Norval Morton, MD   Stopped at 12/14/17 1029  . aspirin EC tablet 81 mg  81 mg Oral Daily Smith, Rondell A, MD   81 mg at 12/14/17 1021  . atorvastatin (LIPITOR) tablet 40 mg  40 mg Oral Daily Tamala Julian, Rondell A, MD   40 mg at 12/14/17 1025  . doxercalciferol (HECTOROL) injection 1 mcg  1 mcg Intravenous Q T,Th,Sa-HD Penninger, Lindsay, PA      . heparin injection 5,000 Units  5,000 Units Subcutaneous Q8H Smith, Rondell A, MD      . hydrALAZINE (APRESOLINE) tablet 10 mg  10 mg Oral Q8H Smith, Rondell A, MD    10 mg at 12/14/17 9147  . insulin aspart (novoLOG) injection 0-9 Units  0-9 Units Subcutaneous TID WC Norval Morton, MD   2 Units at 12/14/17 1202  . levETIRAcetam (KEPPRA) tablet 500 mg  500 mg Oral BID Fuller Plan A, MD   500 mg at 12/14/17 1021  . ondansetron (ZOFRAN) injection 4 mg  4 mg Intravenous Q4H PRN Arrien, Jimmy Picket, MD      . pantoprazole (PROTONIX) injection 40 mg  40 mg Intravenous Q12H Arrien, Jimmy Picket, MD   40 mg at 12/14/17 1020  . sucralfate (CARAFATE) 1 GM/10ML suspension 1 g  1 g Oral TID WC & HS Arrien,  Jimmy Picket, MD   1 g at 12/14/17 1205   Labs: Basic Metabolic Panel: Recent Labs  Lab 12/13/17 1944 12/14/17 0201  NA 136 136  K 5.0 5.1  CL 99* 100*  CO2 23 23  GLUCOSE 152* 134*  BUN 54* 57*  CREATININE 7.36* 7.48*  CALCIUM 9.5 9.3  PHOS  --  2.4*   Liver Function Tests: Recent Labs  Lab 12/14/17 0201  ALBUMIN 3.4*   No results for input(s): LIPASE, AMYLASE in the last 168 hours. No results for input(s): AMMONIA in the last 168 hours. CBC: Recent Labs  Lab 12/13/17 1944 12/14/17 0201  WBC 6.9 9.4  NEUTROABS 4.2  --   HGB 11.8* 11.2*  HCT 37.5 36.0  MCV 89.3 89.6  PLT 197 175   Cardiac Enzymes: Recent Labs  Lab 12/14/17 0201 12/14/17 0752  TROPONINI <0.03 <0.03   CBG: Recent Labs  Lab 12/13/17 1931 12/14/17 0908 12/14/17 1154  GLUCAP 144* 74 159*   Studies/Results: Ct Head Wo Contrast  Result Date: 12/13/2017 CLINICAL DATA:  Found on floor. EXAM: CT HEAD WITHOUT CONTRAST TECHNIQUE: Contiguous axial images were obtained from the base of the skull through the vertex without intravenous contrast. COMPARISON:  11/12/2017. FINDINGS: Brain: No evidence for acute infarction, hemorrhage, mass lesion, hydrocephalus, or extra-axial fluid. Advanced atrophy, with multiple old infarcts, noted on prior scan, greater on the LEFT. Chronic microvascular ischemic change with hypoattenuation of a moderate to advanced nature.  Vascular: Calcification of the cavernous internal carotid arteries consistent with cerebrovascular atherosclerotic disease. No signs of intracranial large vessel occlusion. Skull: Normal. Negative for fracture or focal lesion. Sinuses/Orbits: Mild mucosal thickening but no significant opacity or layering fluid. BILATERAL cataract extraction. Other: None. IMPRESSION: Advanced atrophy and small vessel disease. No acute intracranial findings. Similar appearance to most recent priors. Electronically Signed   By: Staci Righter M.D.   On: 12/13/2017 20:22   Dg Chest Portable 1 View  Result Date: 12/13/2017 CLINICAL DATA:  Status post fall.  Possible aspiration. EXAM: PORTABLE CHEST 1 VIEW COMPARISON:  11/09/2017 FINDINGS: Femoral approach dialysis catheter overlies the expected location of right atrium. Cardiomediastinal silhouette is normal. Mediastinal contours appear intact. Chronic elevation of the right hemidiaphragm. There is no evidence of focal airspace consolidation, pleural effusion or pneumothorax. Right lower lobe atelectatic changes. Osseous structures are without acute abnormality. Soft tissues are grossly normal. IMPRESSION: Chronic elevation of the right hemidiaphragm with right lower lobe atelectatic changes. Electronically Signed   By: Fidela Salisbury M.D.   On: 12/13/2017 19:59    ROS: All others negative except those listed in HPI.   Physical Exam: Vitals:   12/14/17 0130 12/14/17 0200 12/14/17 0215 12/14/17 0700  BP: (!) 158/84 (!) 161/72 (!) 161/72   Pulse: 74 72 72   Resp: 16 11 10 14   Temp:  98.1 F (36.7 C)  97.8 F (36.6 C)  TempSrc:  Oral  Oral  SpO2: 100% 100% 100%   Weight:  69 kg (152 lb 1.9 oz)    Height:  5\' 6"  (1.676 m)       General:  NAD, Pleasant, chronically ill appearing, elderly female Head: NCAT sclera not icteric Neck: Supple. No lymphadenopathy. No JVD Lungs: CTA bilaterally. No wheeze, rales or rhonchi. Breathing is unlabored. Heart: RRR. +8/1  systolic murmur No rubs or gallops.  Abdomen: soft, nontender, +BS, no guarding, no rebound tenderness Lower extremities:L AKA - no stump edema, R TMA - 1+ edema Neuro: A&Ox3. Moves all extremities  spontaneously. Psych:  Responds to questions appropriately with a normal affect. Dialysis Access: R thigh TDC   Dialysis Orders:  [facility] 400/800 EDW 66.5kg, 2K/2Ca Profile 4, Linear Na R thigh TDC  Hectorol 71mcg IV qHD Heparin 5000 unit bolus qHD  Assessment/Plan: 1.  Syncope: ECHO ordered. Cardio following  2. ESRD -  TTS, continue per regular schedule. Orders written. K 5.1 3.  Hypertension/volume - BP elevated, should improve post HD. 3kg over EDW, titrate down as tolerated.  4.  Anemia  - Hgb 11.2. No indication for ESA. 5.  Secondary Hyperparathyroidism -  Ca in goal, P low. Hold binders. Continue VDRA. 6.  Nutrition - Renal diet, Nepro TID 7. Seizures - per primary 8. Multiple CVA - CT unremarkable, per primary 9. CHF - per cardio 10. Depression - per primary   Jen Mow, PA-C [facility] Pager: [phone] 12/14/2017, 1:03 PM   I have seen and examined this patient and agree with plan and assessment in the above note with renal recommendations/intervention highlighted.  Vishruth Seoane B,MD 12/16/2017 7:30 AM

## 2017-12-15 ENCOUNTER — Inpatient Hospital Stay (HOSPITAL_COMMUNITY): Payer: Medicare Other

## 2017-12-15 DIAGNOSIS — I361 Nonrheumatic tricuspid (valve) insufficiency: Secondary | ICD-10-CM

## 2017-12-15 DIAGNOSIS — I340 Nonrheumatic mitral (valve) insufficiency: Secondary | ICD-10-CM

## 2017-12-15 LAB — ECHOCARDIOGRAM COMPLETE
AO mean calculated velocity dopler: 210 cm/s
AOVTI: 61.8 cm
AV Area VTI index: 0.59 cm2/m2
AV Area VTI: 0.95 cm2
AV Area mean vel: 0.94 cm2
AV vel: 1.03
AVA: 1.03 cm2
AVAREAMEANVIN: 0.53 cm2/m2
AVCELMEANRAT: 0.33
AVG: 20 mmHg
AVPG: 35 mmHg
AVPKVEL: 296 cm/s
Ao pk vel: 0.34 m/s
CHL CUP AV PEAK INDEX: 0.54
CHL CUP AV VALUE AREA INDEX: 0.59
CHL CUP MV DEC (S): 201
CHL CUP TV REG PEAK VELOCITY: 270 cm/s
E/e' ratio: 17.24
EWDT: 201 ms
FS: 33 % (ref 28–44)
Height: 66 in
IV/PV OW: 0.89
LA vol A4C: 44.4 ml
LADIAMINDEX: 2.16 cm/m2
LASIZE: 38 mm
LAVOL: 49.6 mL
LAVOLIN: 28.2 mL/m2
LEFT ATRIUM END SYS DIAM: 38 mm
LV PW d: 9 mm — AB (ref 0.6–1.1)
LV e' LATERAL: 6.96 cm/s
LVEEAVG: 17.24
LVEEMED: 17.24
LVOT VTI: 22.5 cm
LVOT area: 2.84 cm2
LVOT diameter: 19 mm
LVOT peak VTI: 0.36 cm
LVOT peak vel: 99.4 cm/s
LVOTSV: 64 mL
Lateral S' vel: 11.4 cm/s
MV pk A vel: 135 m/s
MV pk E vel: 120 m/s
MVPG: 6 mmHg
RV TAPSE: 17.9 mm
RV sys press: 32 mmHg
TDI e' lateral: 6.96
TDI e' medial: 4.35
TRMAXVEL: 270 cm/s
Weight: 2363.33 oz

## 2017-12-15 LAB — BASIC METABOLIC PANEL
Anion gap: 11 (ref 5–15)
BUN: 22 mg/dL — AB (ref 6–20)
CHLORIDE: 100 mmol/L — AB (ref 101–111)
CO2: 24 mmol/L (ref 22–32)
Calcium: 8.2 mg/dL — ABNORMAL LOW (ref 8.9–10.3)
Creatinine, Ser: 4.57 mg/dL — ABNORMAL HIGH (ref 0.44–1.00)
GFR calc Af Amer: 10 mL/min — ABNORMAL LOW (ref >60)
GFR calc non Af Amer: 9 mL/min — ABNORMAL LOW (ref >60)
GLUCOSE: 59 mg/dL — AB (ref 65–99)
POTASSIUM: 4.1 mmol/L (ref 3.5–5.1)
SODIUM: 135 mmol/L (ref 135–145)

## 2017-12-15 LAB — GLUCOSE, CAPILLARY
GLUCOSE-CAPILLARY: 119 mg/dL — AB (ref 65–99)
GLUCOSE-CAPILLARY: 87 mg/dL (ref 65–99)
Glucose-Capillary: 51 mg/dL — ABNORMAL LOW (ref 65–99)
Glucose-Capillary: 69 mg/dL (ref 65–99)
Glucose-Capillary: 91 mg/dL (ref 65–99)

## 2017-12-15 MED ORDER — DEXTROSE 50 % IV SOLN
INTRAVENOUS | Status: AC
  Filled 2017-12-15: qty 50

## 2017-12-15 NOTE — Progress Notes (Signed)
[facility] Rounding Note  Subjective/Interval History:  Had dialysis yesterday No issues documented 2 liters off (weight 69->67 with EDW 66.5) Biggest c/o today is nausea (has had some broth and jello) Says was not a problem prior to current admission  Objective Vital signs in last 24 hours: Vitals:   12/15/17 0501 12/15/17 0700 12/15/17 0800 12/15/17 0814  BP: 117/69   (!) 123/59  Pulse:  79 77 77  Resp:  (!) 0 (!) 9 14  Temp:    98.6 F (37 C)  TempSrc:    Oral  SpO2:  96% 97% 100%  Weight:      Height:       Weight change: -1.761 kg (-14.1 oz)  Intake/Output Summary (Last 24 hours) at 12/15/2017 0943 Last data filed at 12/14/2017 1940 Gross per 24 hour  Intake -  Output 2000 ml  Net -2000 ml   Physical Exam:  Blood pressure (!) 123/59, pulse 77, temperature 98.6 F (37 C), temperature source Oral, resp. rate 14, height 5\' 6"  (1.676 m), weight 67 kg (147 lb 11.3 oz), SpO2 100 %. NAD, Pleasant, chronically ill appearing, elderly female Speaks slowly, deliberately but fully oriented No JVD Lungs clear without crackles or wheezes Regular rhythm. [id number] No S3 2/6 systolic murmur USB No diastolic murmur or rub Abd soft and non-tender Has L AKA - no stump edema RLE no edema, R TMA Old nonfunction AVG L arm Dialysis Access: R thigh [facility]   Recent Labs  Lab 12/13/17 1944 12/14/17 0201 12/15/17 0439  NA 136 136 135  K 5.0 5.1 4.1  CL 99* 100* 100*  CO2 23 23 24   GLUCOSE 152* 134* 59*  BUN 54* 57* 22*  CREATININE 7.36* 7.48* 4.57*  CALCIUM 9.5 9.3 8.2*  PHOS  --  2.4*  --     Recent Labs  Lab 12/14/17 0201  ALBUMIN 3.4*    Recent Labs  Lab 12/13/17 1944 12/14/17 0201  WBC 6.9 9.4  NEUTROABS 4.2  --   HGB 11.8* 11.2*  HCT 37.5 36.0  MCV 89.3 89.6  PLT 197 175    Recent Labs  Lab 12/14/17 0201 12/14/17 0752 12/14/17 1427  TROPONINI <0.03 <0.03 <0.03    Recent Labs  Lab 12/13/17 1931 12/14/17 0908 12/14/17 1154 12/15/17 0812 12/15/17 0833  GLUCAP  144* 74 159* 51* 69   Studies/Results: Ct Head Wo Contrast  Result Date: 12/13/2017 CLINICAL DATA:  Found on floor. EXAM: CT HEAD WITHOUT CONTRAST TECHNIQUE: Contiguous axial images were obtained from the base of the skull through the vertex without intravenous contrast. COMPARISON:  11/12/2017. FINDINGS: Brain: No evidence for acute infarction, hemorrhage, mass lesion, hydrocephalus, or extra-axial fluid. Advanced atrophy, with multiple old infarcts, noted on prior scan, greater on the LEFT. Chronic microvascular ischemic change with hypoattenuation of a moderate to advanced nature. Vascular: Calcification of the cavernous internal carotid arteries consistent with cerebrovascular atherosclerotic disease. No signs of intracranial large vessel occlusion. Skull: Normal. Negative for fracture or focal lesion. Sinuses/Orbits: Mild mucosal thickening but no significant opacity or layering fluid. BILATERAL cataract extraction. Other: None. IMPRESSION: Advanced atrophy and small vessel disease. No acute intracranial findings. Similar appearance to most recent priors. Electronically Signed   By: Staci Righter M.D.   On: 12/13/2017 20:22   Dg Chest Portable 1 View  Result Date: 12/13/2017 CLINICAL DATA:  Status post fall.  Possible aspiration. EXAM: PORTABLE CHEST 1 VIEW COMPARISON:  11/09/2017 FINDINGS: Femoral approach dialysis catheter overlies the expected location of right  atrium. Cardiomediastinal silhouette is normal. Mediastinal contours appear intact. Chronic elevation of the right hemidiaphragm. There is no evidence of focal airspace consolidation, pleural effusion or pneumothorax. Right lower lobe atelectatic changes. Osseous structures are without acute abnormality. Soft tissues are grossly normal. IMPRESSION: Chronic elevation of the right hemidiaphragm with right lower lobe atelectatic changes. Electronically Signed   By: Fidela Salisbury M.D.   On: 12/13/2017 19:59   Medications: . sodium chloride     . sodium chloride     . amLODipine  10 mg Oral Daily  . aspirin EC  81 mg Oral Daily  . atorvastatin  40 mg Oral Daily  . doxercalciferol  1 mcg Intravenous Q T,Th,Sa-HD  . heparin  5,000 Units Subcutaneous Q8H  . hydrALAZINE  10 mg Oral Q8H  . insulin aspart  0-9 Units Subcutaneous TID WC  . levETIRAcetam  500 mg Oral BID  . pantoprazole (PROTONIX) IV  40 mg Intravenous Q12H  . sucralfate  1 g Oral TID WC & HS   Dialysis Orders:  [facility] 4hrs 400/800 EDW 66.5kg, 2K/2Ca Profile 4, Linear Na R thigh TDC  Hectorol 46mcg IV qHD Heparin 5000 unit bolus qHD  Background: ESRD (TTS), DM2, HTN, dCHF, h/o stroke with seizures. Admitted post LOC, EMS noted bradycardia and 6 second pause (no hard copy). Initial eval here sinus tachy, first degree AVB, CT neg for new stroke. Admitted for cards eval and tele monitoring.   Assessment/Plan: 1. Syncope: ECHO pending. Cardiology following. Recommending continuing to hold carvedilol and monitoring on tele. SR w/1st degree AVB now. They suspect a vagally mediated event.  2. ESRD -  TTS, continue per regular schedule. Poor outpt HD compliance (missed 2 TMT's PTA, shortened the only 1 she attended). Had HD here Saturday,weight 69->67 with EDW 66.5 and labs OK today 3. Nausea - nauseous but tolerating clears at this time. Getting zofran and carafate (must limit use of the latter d/t aluminum content)  4. Vascular access - has only R TDC thigh. I can't tell what future access plan is. Will review outpt notes from her HD unit 5. Anemia  - Hgb 11.2. No indication for Aranesp or EPO 6. Secondary Hyperparathyroidism -  Ca in goal, P low. Holding binders. Continue hectorol with dialysis. 7. HTN - meds/volume control. Excellent at this time 8. Nutrition - Renal diet, Nepro TID 9. Seizures - Keppra 10. Multiple CVA's - CT unremarkable 11. CHF - per cardio 12. Depression - per primary  Jamal Maes, MD [facility] [phone]  Pager 12/15/2017, 9:48 AM

## 2017-12-15 NOTE — Progress Notes (Signed)
  Echocardiogram 2D Echocardiogram has been performed.  Johny Chess 12/15/2017, 11:17 AM

## 2017-12-15 NOTE — Progress Notes (Signed)
Admission note:  Arrival Method: Patient arrived in bed from [place]. Mental Orientation: Alert and oriented x 4. Telemetry: 61M#14, NSR. Assessment: See doc flow sheets. Skin: Warm, dry and intact except skin tear on the R knee, foam drsg applied. IV: Right AC saline lock. Pain: denies any pain.  Safety Measures: Bed in low position, call bell and phone within reach. Fall Prevention Safety Plan: Reviewed the plan, verbalized understanding. Admission Screening:  Completed. 6700 Orientation: Patient has been oriented to the unit, staff and to the room.

## 2017-12-15 NOTE — Progress Notes (Signed)
PROGRESS NOTE    Deborah Hendricks  NGE:[id number] DOB: 10-17-1946 DOA: 12/13/2017 PCP: [name], MD    Brief Narrative:  72 year old female who presented after a syncope episode. She does have a significant past medical history for end-stage renal disease on hemodialysis, dyslipidemia, hypertension, type 2 diabetes mellitus, atrial fibrillation, peripheral vascular disease status post left AKA, diastolic heart failure and history of CVA with seizures. Patient had a sudden loss of consciousness while seated, apparently she had a prodrome of nausea, then she was found down with emesis nearby. EMS found her to be bradycardic and a reported but not recorded six second pause, suspected third-degree AV block. On the initial physical examination blood pressure 152/70, heart rate 73, respiratory rate 10, oxygen saturation 100%, dry mucous membranes, lungs are clear to auscultation bilaterally, heart S1-S2 present, positive systolic murmur, abdomen soft nontender, no lower extremity edema, left AKA. Sodium 136, potassium 5.0, chloride 99, bicarbonate 23, glucose 152, BUN 54, creatinine 7.6, magnesium 2.8, white count 6.9, hemoglobin 11.8, hematocrit 37.5, platelets 197. Head CT with advanced atrophy and small vessel disease, no acute changes. Chest x-ray with right rotation, right hemidiaphragm elevation, no infiltrates. EKG heart rate 116 beats per minute, first degree AV block, left axis deviation, premature ventricular complexes.  Patient was admitted to the hospital with working diagnosis of syncope due to symptomatic bradycardia, rule out third-degree AV block.   Assessment & Plan:   Principal Problem:   Syncope and collapse Active Problems:   Type II diabetes mellitus with neurological manifestations (HCC)   PAD (peripheral artery disease) (HCC)   ESRD on dialysis ([facility])   S/P AKA (above knee amputation) unilateral, left (HCC)   Anemia in chronic kidney disease (CKD)   AV block, 1st  degree   1. Syncope due to bradycardia, rule out third-degree AV block. Telemetry personally reviewed HR has been above 65, patient has remained asymptomatic. Will continue telemetry monitoring, avoid further AV blockade. Patient is wheelchair bound with very low functional physical capacity. Tolerated HD well. Transfer to telemetry unit.   2. Refractive nausea and vomiting. Clinically has improved will continue antiacid therapy with pantoprazole, sucralfate and as needed antiemetics. Advance diet.   2. Atrial fibrillation, paroxysmal. Telemetry monitoring, avoid further av blockade. Anticoagulation with aspirin per outpatient regimen.  3. Type 2 diabetes mellitus.  Capillary glucose 74, 159, 51, 69, 91. Advance diet and continue insulin sliding scale for glucose cover and monitoring.   4. End-stage renal disease on hemodialysis. Patient tolerated HD well, no hypotension or further bradycardia, scheduled next session on Tuesday.   5. HTN. Continue amlodipine for blood pressure control, systolic 11 to 401 mmHg  6. Seizures. On keppra per home regimen, tolerating well.    DVT prophylaxis: scd  Code Status:  full Family Communication:  No family at the bedside Disposition Plan:  home   Consultants:   Cardiology   Nephrology  Procedures:     Antimicrobials:       Subjective: Patient feeling better, no further nausea or vomiting, no chest pain or dyspnea, tolerated HD well. No palpitations.   Objective: Vitals:   12/15/17 0501 12/15/17 0700 12/15/17 0800 12/15/17 0814  BP: 117/69   (!) 123/59  Pulse:  79 77 77  Resp:  (!) 0 (!) 9 14  Temp:    98.6 F (37 C)  TempSrc:    Oral  SpO2:  96% 97% 100%  Weight:      Height:  Intake/Output Summary (Last 24 hours) at 12/15/2017 0824 Last data filed at 12/14/2017 1940 Gross per 24 hour  Intake -  Output 2000 ml  Net -2000 ml   Filed Weights   12/14/17 0200 12/14/17 1540 12/14/17 1940  Weight: 69 kg  (152 lb 1.9 oz) 69 kg (152 lb 1.9 oz) 67 kg (147 lb 11.3 oz)    Examination:   General: Not in pain or dyspnea.  Neurology: Awake and alert, non focal  E ENT: mild pallor, no icterus, oral mucosa moist Cardiovascular: No JVD. S1-S2 present, rhythmic, no gallops, rubs, or murmurs. No lower extremity edema. Pulmonary: vesicular breath sounds bilaterally, adequate air movement, no wheezing, rhonchi or rales. Gastrointestinal. Abdomen protuberant, no organomegaly, non tender, no rebound or guarding Skin. No rashes Musculoskeletal: left aka.      Data Reviewed: I have personally reviewed following labs and imaging studies  CBC: Recent Labs  Lab 12/13/17 1944 12/14/17 0201  WBC 6.9 9.4  NEUTROABS 4.2  --   HGB 11.8* 11.2*  HCT 37.5 36.0  MCV 89.3 89.6  PLT 197 782   Basic Metabolic Panel: Recent Labs  Lab 12/13/17 1944 12/14/17 0201 12/15/17 0439  NA 136 136 135  K 5.0 5.1 4.1  CL 99* 100* 100*  CO2 23 23 24   GLUCOSE 152* 134* 59*  BUN 54* 57* 22*  CREATININE 7.36* 7.48* 4.57*  CALCIUM 9.5 9.3 8.2*  MG 2.8* 2.7*  --   PHOS  --  2.4*  --    GFR: Estimated Creatinine Clearance: 10.4 mL/min (A) (by C-G formula based on SCr of 4.57 mg/dL (H)). Liver Function Tests: Recent Labs  Lab 12/14/17 0201  ALBUMIN 3.4*   No results for input(s): LIPASE, AMYLASE in the last 168 hours. No results for input(s): AMMONIA in the last 168 hours. Coagulation Profile: Recent Labs  Lab 12/13/17 1944  INR 1.16   Cardiac Enzymes: Recent Labs  Lab 12/14/17 0201 12/14/17 0752 12/14/17 1427  TROPONINI <0.03 <0.03 <0.03   BNP (last 3 results) No results for input(s): PROBNP in the last 8760 hours. HbA1C: No results for input(s): HGBA1C in the last 72 hours. CBG: Recent Labs  Lab 12/13/17 1931 12/14/17 0908 12/14/17 1154 12/15/17 0812  GLUCAP 144* 74 159* 51*   Lipid Profile: No results for input(s): CHOL, HDL, LDLCALC, TRIG, CHOLHDL, LDLDIRECT in the last 72  hours. Thyroid Function Tests: Recent Labs    12/13/17 1938  TSH 2.139   Anemia Panel: No results for input(s): VITAMINB12, FOLATE, FERRITIN, TIBC, IRON, RETICCTPCT in the last 72 hours.    Radiology Studies: I have reviewed all of the imaging during this hospital visit personally     Scheduled Meds: . amLODipine  10 mg Oral Daily  . aspirin EC  81 mg Oral Daily  . atorvastatin  40 mg Oral Daily  . doxercalciferol  1 mcg Intravenous Q T,Th,Sa-HD  . heparin  5,000 Units Subcutaneous Q8H  . hydrALAZINE  10 mg Oral Q8H  . insulin aspart  0-9 Units Subcutaneous TID WC  . levETIRAcetam  500 mg Oral BID  . pantoprazole (PROTONIX) IV  40 mg Intravenous Q12H  . sucralfate  1 g Oral TID WC & HS   Continuous Infusions: . sodium chloride    . sodium chloride       LOS: 1 day        Tawni Millers, MD [facility] Pager [phone]

## 2017-12-15 NOTE — Progress Notes (Signed)
Report called to RN. Patient transferring to room 08 on Deborah Hendricks. All belongings are accounted for. Will transfer patient wen she finishes eating her lunch.

## 2017-12-15 NOTE — Progress Notes (Signed)
Attempted to call report, 1st attempt. Receiving RN on phone giving report on another patient. Left number for her to call back.

## 2017-12-16 ENCOUNTER — Other Ambulatory Visit: Payer: Self-pay | Admitting: Physician Assistant

## 2017-12-16 DIAGNOSIS — I455 Other specified heart block: Secondary | ICD-10-CM

## 2017-12-16 DIAGNOSIS — I350 Nonrheumatic aortic (valve) stenosis: Secondary | ICD-10-CM

## 2017-12-16 DIAGNOSIS — R55 Syncope and collapse: Secondary | ICD-10-CM

## 2017-12-16 LAB — RENAL FUNCTION PANEL
ALBUMIN: 3.1 g/dL — AB (ref 3.5–5.0)
Anion gap: 13 (ref 5–15)
BUN: 30 mg/dL — AB (ref 6–20)
CALCIUM: 9.1 mg/dL (ref 8.9–10.3)
CO2: 24 mmol/L (ref 22–32)
Chloride: 98 mmol/L — ABNORMAL LOW (ref 101–111)
Creatinine, Ser: 5.78 mg/dL — ABNORMAL HIGH (ref 0.44–1.00)
GFR calc Af Amer: 8 mL/min — ABNORMAL LOW (ref >60)
GFR calc non Af Amer: 7 mL/min — ABNORMAL LOW (ref >60)
GLUCOSE: 93 mg/dL (ref 65–99)
PHOSPHORUS: 2.4 mg/dL — AB (ref 2.5–4.6)
Potassium: 4.3 mmol/L (ref 3.5–5.1)
SODIUM: 135 mmol/L (ref 135–145)

## 2017-12-16 LAB — GLUCOSE, CAPILLARY
Glucose-Capillary: 87 mg/dL (ref 65–99)
Glucose-Capillary: 105 mg/dL — ABNORMAL HIGH (ref 65–99)
Glucose-Capillary: 118 mg/dL — ABNORMAL HIGH (ref 65–99)

## 2017-12-16 NOTE — Clinical Social Work Note (Signed)
Ms. Morand is medically stable for discharge and will be returning to [facility], where she is a LTC resident. Patient's daughter Donella Stade contacted and advised of discharge. Discharge clinicals transmitted to skilled facility. Ms. Mangrum will be transported back to [facility] by ambulance. CSW signing off as no other SW intervention services needed.  Sadarius Norman Givens, MSW, LCSW Licensed Clinical Social Worker [place] [phone]

## 2017-12-16 NOTE — Clinical Social Work Note (Signed)
Clinical Social Work Assessment  Patient Details  Name: Deborah Hendricks MRN: [id number] Date of Birth: 1946/08/31  Date of referral:  12/16/17               Reason for consult:  Facility Placement, Discharge Planning                Permission sought to share information with:  Family Supports Permission granted to share information::  Yes, Release of Information Signed  Name::     Deborah Hendricks and Deborah Hendricks::     Relationship::  Daughters  Contact Information:  Deborah Hendricks [phone] and Crystal - [phone]  Housing/Transportation Living arrangements for the past 2 months:  Skilled Nursing Facility([facility]) Source of Information:  Patient, Other (Comment Required)(Chart) Patient Interpreter Needed:  None Criminal Activity/Legal Involvement Pertinent to Current Situation/Hospitalization:  No - Comment as needed Significant Relationships:  Adult Children, Other Family Members Lives with:  Facility Resident([facility] for approx. 8 months per patient) Do you feel safe going back to the place where you live?  Yes Need for family participation in patient care:  Yes (Comment)  Care giving concerns: Patient expressed no concerns regarding the care she receives at [facility].   Social Worker assessment / plan:  CSW talked with patient at the bedside regarding her discharge. Ms. Stoneberg was sitting up in bed eating lunch and allowed CSW to talk with her. Ms. Rogoff confirmed that she is from [facility] and plans to return there. Patient talked lovingly about her great-grandchildren and informed CSW that her daughter Deborah Hendricks lives in [place] and her daughter La Center lives in [place], [place].  Employment status:  Retired Forensic scientist:  Information systems manager, [employer] [place] PT Recommendations:  Not assessed at this time [place] / Referral to community resources:  Other (Comment Required)(None needed or requested as patient from a skilled  facility)  Patient/Family's Response to care:  Ms. Glandon expressed no concerns to CSW regarding her care during hospitalization.  Patient/Family's Understanding of and Emotional Response to Diagnosis, Current Treatment, and Prognosis:  Patient appeared aware that she was going to discharge today. The reasons for her coming to the hospital was not discussed.   Emotional Assessment Appearance:  Appears stated age Attitude/Demeanor/Rapport:  Other(Appropriate) Affect (typically observed):  Appropriate, Pleasant Orientation:  Oriented to Self, Oriented to Situation, Oriented to Place Alcohol / Substance use:  Tobacco Use, Alcohol Use, Illicit Drugs(Patient reported that she quit smoking and quit drining in June 2016 and does not use illicit drugs) Psych involvement (Current and /or in the community):  No (Comment)  Discharge Needs  Concerns to be addressed:  Discharge Planning Concerns Readmission within the last 30 days:  No Current discharge risk:  None Barriers to Discharge:  No Barriers Identified   Sable Feil, LCSW 12/16/2017, 2:28 PM

## 2017-12-16 NOTE — Discharge Summary (Signed)
Physician Discharge Summary  Brittani Purdum ZOX:[id number] DOB: 06-30-1946 DOA: 12/13/2017  PCP: [name], MD  Admit date: 12/13/2017 Discharge date: 12/16/2017  Admitted From:  SNF Disposition:  SNF  Recommendations for Outpatient Follow-up and new medication changes:  1. Follow up with PCP in 1- weeks 2. Coreg has been discontinued due to bradycardia  Home Health: na  Equipment/Devices: no   Discharge Condition: stable CODE STATUS: DNR  Diet recommendation: Heart healthy and diabetic prudent  Brief/Interim Summary: 72 year old female who presented after a syncope episode.She does have a significant past medical history for end-stage renal disease on hemodialysis, dyslipidemia, hypertension, type 2 diabetes mellitus,atrial fibrillation, peripheral vascular disease status post left AKA, diastolic heart failure and history of CVA with seizures.Patient had a sudden loss of consciousness while seated,apparently she had a prodrome of nausea, thenshe was found down with emesis nearby.EMS found her to be bradycardicand a reportedbutnotrecordedsix second pause,suspected third-degree AV block.On the initial physical examination blood pressure 152/70, heart rate 72, respiratory rate 10, oxygen saturation 100%, dry mucous membranes, lungs were clear to auscultation bilaterally, heart S1-S2 present, positive systolic murmur, abdomen soft nontender, no lower extremity edema, left AKA.Right foot with toes amputation. Sodium 136,potassium 5.0, chloride 99, bicarbonate 23, glucose 152, BUN 54, creatinine 7.6, magnesium 2.8, white count 6.9, hemoglobin 11.8, hematocrit 37.5, platelets 197.Head CT with advanced atrophy and small vessel disease, no acute changes.Chest x-ray with right rotation, right hemidiaphragm elevation, no infiltrates. EKG heart rate 116 beats per minute, first degree AV block, left axis deviation, premature ventricular complexes.   Patient was admitted to the hospital  with working diagnosis of syncope due to symptomatic bradycardia, rule out third-degree AV block.  1. Syncope due to bradycardia, triggered by vagal reflex. Patient was admitted to the stepdown unit, she was placed on a telemetry monitor, beta-blockade was stopped. Her heart rate remained well-controlled, maintaining a sinus rhythm with a first-degree AV block, no further bradycardic events. Echocardiography showed LV systolic function of 81% to 70%, the aortic valve was severely calcified with moderate severe stenosis, the peak PA pressure was 32. Patient underwent hemodialysis with no major complications. She will return to skilled nursing facility, with a follow-up within 7 days.   2. Refractive nausea and vomiting. Patient was placed on the clear liquid diet, she received antiacid therapy with pantoprazole and sucralfate, along with as needed antiemetics with Zofran. She responded well to medical therapy, her diet was advanced with good toleration.   3. Paroxysmal atrial fibrillation. Patient remained sinus rhythm, AV blockade was discontinued due to bradycardia. Anticoagulation with aspirin.   4. Type 2 diabetes mellitus. Patient was placed on insulin sliding scale for glucose coverage and monitoring, capillary glucose remained well-controlled, 69, 91, 119, 87.   5. Hypertension. Blood pressure remained well-controlled, patient will continue amlodipine and hydralazine at discharge.  6. Seizures. Stable with no exacerbation, continue Keppra.    Discharge Diagnoses:  Principal Problem:   Syncope and collapse Active Problems:   Type II diabetes mellitus with neurological manifestations (HCC)   PAD (peripheral artery disease) (HCC)   ESRD on dialysis ([place])   S/P AKA (above knee amputation) unilateral, left (HCC)   Anemia in chronic kidney disease (CKD)   AV block, 1st degree    Discharge Instructions   Allergies as of 12/16/2017      Reactions   Penicillins Hives, Itching, Rash, Other  (See Comments)   Has patient had a PCN reaction causing immediate rash, facial/tongue/throat swelling, SOB or lightheadedness with  hypotension: Yes Has patient had a PCN reaction causing severe rash involving mucus membranes or skin necrosis:unknown MAR source Has patient had a PCN reaction that required hospitalization:unknown MAR source Has patient had a PCN reaction occurring within the last 10 years:unknown MAR source If all of the above answers are "NO", then may proceed with Cephalosporin use.   Sulfa Antibiotics    UNSPECIFIED REACTION    Amoxicillin Hives, Itching, Rash   Has patient had a PCN reaction causing immediate rash, facial/tongue/throat swelling, SOB or lightheadedness with hypotension:unknown MAR source Has patient had a PCN reaction causing severe rash involving mucus membranes or skin necrosis:unknown MAR source Has patient had a PCN reaction that required hospitalization:unknown MAR source Has patient had a PCN reaction occurring within the last 10 years: unknown MAR source If all of the above answers are "NO", then may proceed with Cephalosporin use.   Latex Rash      Medication List    STOP taking these medications   carvedilol 3.125 MG tablet Commonly known as:  COREG     TAKE these medications   amLODipine 10 MG tablet Commonly known as:  NORVASC Take 1 tablet (10 mg total) by mouth daily.   aspirin EC 81 MG tablet Take 81 mg by mouth daily.   atorvastatin 40 MG tablet Commonly known as:  LIPITOR Take 1 tablet (40 mg total) by mouth daily.   calcium acetate 667 MG capsule Commonly known as:  PHOSLO Take 2,001 mg by mouth 3 (three) times daily with meals.   citalopram 10 MG tablet Commonly known as:  CELEXA Take 10 mg by mouth daily. (0900)   esomeprazole 20 MG capsule Commonly known as:  NEXIUM Take 1 capsule (20 mg total) by mouth daily.   feeding supplement (NEPRO CARB STEADY) Liqd Take 237 mLs by mouth daily with lunch.   feeding  supplement (PRO-STAT SUGAR FREE 64) Liqd Take 30 mLs by mouth 2 (two) times daily. (0900 & 2100)   gabapentin 100 MG capsule Commonly known as:  NEURONTIN Take 1 capsule (100 mg total) by mouth at bedtime.   hydrALAZINE 10 MG tablet Commonly known as:  APRESOLINE Take 1 tablet (10 mg total) by mouth every 8 (eight) hours.   insulin aspart 100 UNIT/ML injection Commonly known as:  novoLOG Inject 2-10 Units into the skin 3 (three) times daily as needed for high blood sugar. 151-200=2 units, 201-250=4 units, 251-300=6 units, 301-350=8 units, 351-400=10 units, >400=10 units and Call MD   levETIRAcetam 500 MG tablet Commonly known as:  KEPPRA Take 1 tablet (500 mg total) by mouth 2 (two) times daily.   multivitamin tablet Take 1 tablet by mouth daily.   ondansetron 4 MG disintegrating tablet Commonly known as:  ZOFRAN ODT Take 1 tablet (4 mg total) by mouth every 8 (eight) hours as needed for nausea or vomiting.   polyethylene glycol powder powder Commonly known as:  GLYCOLAX/MIRALAX Take 17 g by mouth at bedtime. HOLD FOR LOOSE STOOLS   psyllium 0.52 g capsule Commonly known as:  REGULOID One po tid prn constipation. What changed:    how much to take  how to take this  when to take this  reasons to take this  additional instructions       Allergies  Allergen Reactions  . Penicillins Hives, Itching, Rash and Other (See Comments)    Has patient had a PCN reaction causing immediate rash, facial/tongue/throat swelling, SOB or lightheadedness with hypotension: Yes Has patient had a PCN  reaction causing severe rash involving mucus membranes or skin necrosis:unknown MAR source Has patient had a PCN reaction that required hospitalization:unknown MAR source Has patient had a PCN reaction occurring within the last 10 years:unknown MAR source If all of the above answers are "NO", then may proceed with Cephalosporin use.  . Sulfa Antibiotics     UNSPECIFIED REACTION   .  Amoxicillin Hives, Itching and Rash    Has patient had a PCN reaction causing immediate rash, facial/tongue/throat swelling, SOB or lightheadedness with hypotension:unknown MAR source Has patient had a PCN reaction causing severe rash involving mucus membranes or skin necrosis:unknown MAR source Has patient had a PCN reaction that required hospitalization:unknown MAR source Has patient had a PCN reaction occurring within the last 10 years: unknown MAR source If all of the above answers are "NO", then may proceed with Cephalosporin use.   . Latex Rash    Consultations:  Cardiology   Nephrology    Procedures/Studies: Ct Head Wo Contrast  Result Date: 12/13/2017 CLINICAL DATA:  Found on floor. EXAM: CT HEAD WITHOUT CONTRAST TECHNIQUE: Contiguous axial images were obtained from the base of the skull through the vertex without intravenous contrast. COMPARISON:  11/12/2017. FINDINGS: Brain: No evidence for acute infarction, hemorrhage, mass lesion, hydrocephalus, or extra-axial fluid. Advanced atrophy, with multiple old infarcts, noted on prior scan, greater on the LEFT. Chronic microvascular ischemic change with hypoattenuation of a moderate to advanced nature. Vascular: Calcification of the cavernous internal carotid arteries consistent with cerebrovascular atherosclerotic disease. No signs of intracranial large vessel occlusion. Skull: Normal. Negative for fracture or focal lesion. Sinuses/Orbits: Mild mucosal thickening but no significant opacity or layering fluid. BILATERAL cataract extraction. Other: None. IMPRESSION: Advanced atrophy and small vessel disease. No acute intracranial findings. Similar appearance to most recent priors. Electronically Signed   By: Staci Righter M.D.   On: 12/13/2017 20:22   Dg Chest Portable 1 View  Result Date: 12/13/2017 CLINICAL DATA:  Status post fall.  Possible aspiration. EXAM: PORTABLE CHEST 1 VIEW COMPARISON:  11/09/2017 FINDINGS: Femoral approach dialysis  catheter overlies the expected location of right atrium. Cardiomediastinal silhouette is normal. Mediastinal contours appear intact. Chronic elevation of the right hemidiaphragm. There is no evidence of focal airspace consolidation, pleural effusion or pneumothorax. Right lower lobe atelectatic changes. Osseous structures are without acute abnormality. Soft tissues are grossly normal. IMPRESSION: Chronic elevation of the right hemidiaphragm with right lower lobe atelectatic changes. Electronically Signed   By: Fidela Salisbury M.D.   On: 12/13/2017 19:59       Subjective: Patient feeling better, no nausea or vomiting, no abdominal pain, no palpitations, dyspnea or chest pain.   Discharge Exam: Vitals:   12/16/17 0456 12/16/17 1005  BP: 128/60 (!) 111/57  Pulse: 74 73  Resp: 20 19  Temp: 98.6 F (37 C) 98.2 F (36.8 C)  SpO2: 98% 99%   Vitals:   12/15/17 1600 12/15/17 2043 12/16/17 0456 12/16/17 1005  BP: 114/64 (!) 142/79 128/60 (!) 111/57  Pulse: 72 80 74 73  Resp: 18 19 20 19   Temp: 98.2 F (36.8 C) 98.6 F (37 C) 98.6 F (37 C) 98.2 F (36.8 C)  TempSrc: Oral Oral Oral Oral  SpO2: 96% 98% 98% 99%  Weight: 67.9 kg (149 lb 11.1 oz) 67.7 kg (149 lb 4 oz)    Height: 5\' 6"  (1.676 m)       General: Not in pain or dyspnea, deconditioned Neurology: Awake and alert, non focal  E ENT: mild  pallor, no icterus, oral mucosa moist Cardiovascular: No JVD. S1-S2 present, rhythmic, no gallops, rubs, or murmurs. No lower extremity edema. Pulmonary: vesicular breath sounds bilaterally, adequate air movement, no wheezing, rhonchi or rales. Gastrointestinal. Abdomen protuberant, no organomegaly, non tender, no rebound or guarding Skin. No rashes Musculoskeletal: left AKA, with amputated toes on the right foot.    The results of significant diagnostics from this hospitalization (including imaging, microbiology, ancillary and laboratory) are listed below for reference.      Microbiology: Recent Results (from the past 240 hour(s))  MRSA PCR Screening     Status: None   Collection Time: 12/14/17  2:34 AM  Result Value Ref Range Status   MRSA by PCR NEGATIVE NEGATIVE Final    Comment:        The GeneXpert MRSA Assay (FDA approved for NASAL specimens only), is one component of a comprehensive MRSA colonization surveillance program. It is not intended to diagnose MRSA infection nor to guide or monitor treatment for MRSA infections. Performed at [facility], [place] [address], [place], [place] [address]      Labs: BNP (last 3 results) No results for input(s): BNP in the last 8760 hours. Basic Metabolic Panel: Recent Labs  Lab 12/13/17 1944 12/14/17 0201 12/15/17 0439 12/16/17 0716  NA 136 136 135 135  K 5.0 5.1 4.1 4.3  CL 99* 100* 100* 98*  CO2 23 23 24 24   GLUCOSE 152* 134* 59* 93  BUN 54* 57* 22* 30*  CREATININE 7.36* 7.48* 4.57* 5.78*  CALCIUM 9.5 9.3 8.2* 9.1  MG 2.8* 2.7*  --   --   PHOS  --  2.4*  --  2.4*   Liver Function Tests: Recent Labs  Lab 12/14/17 0201 12/16/17 0716  ALBUMIN 3.4* 3.1*   No results for input(s): LIPASE, AMYLASE in the last 168 hours. No results for input(s): AMMONIA in the last 168 hours. CBC: Recent Labs  Lab 12/13/17 1944 12/14/17 0201  WBC 6.9 9.4  NEUTROABS 4.2  --   HGB 11.8* 11.2*  HCT 37.5 36.0  MCV 89.3 89.6  PLT 197 175   Cardiac Enzymes: Recent Labs  Lab 12/14/17 0201 12/14/17 0752 12/14/17 1427  TROPONINI <0.03 <0.03 <0.03   BNP: Invalid input(s): POCBNP CBG: Recent Labs  Lab 12/15/17 0833 12/15/17 1201 12/15/17 1552 12/15/17 2042 12/16/17 0754  GLUCAP 69 91 119* 87 87   D-Dimer No results for input(s): DDIMER in the last 72 hours. Hgb A1c No results for input(s): HGBA1C in the last 72 hours. Lipid Profile No results for input(s): CHOL, HDL, LDLCALC, TRIG, CHOLHDL, LDLDIRECT in the last 72 hours. Thyroid function studies Recent Labs    12/13/17 1938   TSH 2.139   Anemia work up No results for input(s): VITAMINB12, FOLATE, FERRITIN, TIBC, IRON, RETICCTPCT in the last 72 hours. Urinalysis    Component Value Date/Time   COLORURINE YELLOW 01/28/2014 1129   APPEARANCEUR CLEAR 01/28/2014 1129   LABSPEC 1.012 01/28/2014 1129   PHURINE 6.0 01/28/2014 1129   GLUCOSEU NEGATIVE 01/28/2014 1129   HGBUR NEGATIVE 01/28/2014 1129   BILIRUBINUR NEGATIVE 01/28/2014 1129   [place] 01/28/2014 1129   PROTEINUR 100 (A) 01/28/2014 1129   UROBILINOGEN 0.2 01/28/2014 1129   NITRITE NEGATIVE 01/28/2014 1129   LEUKOCYTESUR TRACE (A) 01/28/2014 1129   Sepsis Labs Invalid input(s): PROCALCITONIN,  WBC,  LACTICIDVEN Microbiology Recent Results (from the past 240 hour(s))  MRSA PCR Screening     Status: None   Collection  Time: 12/14/17  2:34 AM  Result Value Ref Range Status   MRSA by PCR NEGATIVE NEGATIVE Final    Comment:        The GeneXpert MRSA Assay (FDA approved for NASAL specimens only), is one component of a comprehensive MRSA colonization surveillance program. It is not intended to diagnose MRSA infection nor to guide or monitor treatment for MRSA infections. Performed at [facility], [place] [address], [place], [place] [address]      Time coordinating discharge: 45 minutes  SIGNED:   Tawni Millers, MD  [facility] 12/16/2017, 10:37 AM Pager [phone]  If 7PM-7AM, please contact night-coverage [email or web address] Password [email or web address]

## 2017-12-16 NOTE — Progress Notes (Signed)
Pt left on stretcher via [facility]. Alert and Oriented x2 (self & place) IV removed. D/c paperwork explained and given to pt. Advised that if she had fever to return to ED.

## 2017-12-16 NOTE — Progress Notes (Signed)
Report given to [facility] at [facility]. Waiting for [facility] to transport pt.

## 2017-12-16 NOTE — Progress Notes (Signed)
Progress Note  Patient Name: Deborah Hendricks Date of Encounter: 12/16/2017  Primary Cardiologist: Dr. Gwenlyn Found  Subjective   No chest pain or dyspnea.   Inpatient Medications    Scheduled Meds: . amLODipine  10 mg Oral Daily  . aspirin EC  81 mg Oral Daily  . atorvastatin  40 mg Oral Daily  . doxercalciferol  1 mcg Intravenous Q T,Th,Sa-HD  . heparin  5,000 Units Subcutaneous Q8H  . hydrALAZINE  10 mg Oral Q8H  . insulin aspart  0-9 Units Subcutaneous TID WC  . levETIRAcetam  500 mg Oral BID  . pantoprazole (PROTONIX) IV  40 mg Intravenous Q12H  . sucralfate  1 g Oral TID WC & HS   Continuous Infusions: . sodium chloride    . sodium chloride     PRN Meds: sodium chloride, sodium chloride, acetaminophen, alteplase, heparin, heparin, lidocaine (PF), lidocaine-prilocaine, ondansetron (ZOFRAN) IV, pentafluoroprop-tetrafluoroeth, traZODone   Vital Signs    Vitals:   12/15/17 1600 12/15/17 2043 12/16/17 0456 12/16/17 1005  BP: 114/64 (!) 142/79 128/60 (!) 111/57  Pulse: 72 80 74 73  Resp: 18 19 20 19   Temp: 98.2 F (36.8 C) 98.6 F (37 C) 98.6 F (37 C) 98.2 F (36.8 C)  TempSrc: Oral Oral Oral Oral  SpO2: 96% 98% 98% 99%  Weight: 149 lb 11.1 oz (67.9 kg) 149 lb 4 oz (67.7 kg)    Height: 5\' 6"  (1.676 m)       Intake/Output Summary (Last 24 hours) at 12/16/2017 1101 Last data filed at 12/16/2017 1008 Gross per 24 hour  Intake 600 ml  Output 150 ml  Net 450 ml   Filed Weights   12/14/17 1940 12/15/17 1600 12/15/17 2043  Weight: 147 lb 11.3 oz (67 kg) 149 lb 11.1 oz (67.9 kg) 149 lb 4 oz (67.7 kg)    Telemetry    SR with rare PVC - Personally Reviewed  ECG    N/A  Physical Exam   GEN: No acute distress.   Neck: No JVD Cardiac: RRR, no murmurs, rubs, or gallops.  Respiratory: Clear to auscultation bilaterally. GI: Soft, nontender, non-distended  MS: No edema; s/p L AKA Neuro:  Nonfocal  Psych: Normal affect   Labs    Chemistry Recent Labs  Lab  12/14/17 0201 12/15/17 0439 12/16/17 0716  NA 136 135 135  K 5.1 4.1 4.3  CL 100* 100* 98*  CO2 23 24 24   GLUCOSE 134* 59* 93  BUN 57* 22* 30*  CREATININE 7.48* 4.57* 5.78*  CALCIUM 9.3 8.2* 9.1  ALBUMIN 3.4*  --  3.1*  GFRNONAA 5* 9* 7*  GFRAA 6* 10* 8*  ANIONGAP 13 11 13      Hematology Recent Labs  Lab 12/13/17 1944 12/14/17 0201  WBC 6.9 9.4  RBC 4.20 4.02  HGB 11.8* 11.2*  HCT 37.5 36.0  MCV 89.3 89.6  MCH 28.1 27.9  MCHC 31.5 31.1  RDW 16.8* 16.7*  PLT 197 175    Cardiac Enzymes Recent Labs  Lab 12/14/17 0201 12/14/17 0752 12/14/17 1427  TROPONINI <0.03 <0.03 <0.03    Recent Labs  Lab 12/13/17 1950  TROPIPOC 0.02       Radiology    No results found.  Cardiac Studies   Echo 12/15/17 Study Conclusions  - Left ventricle: The cavity size was normal. Wall thickness was   normal. Systolic function was vigorous. The estimated ejection   fraction was in the range of 65% to 70%. Wall motion  was normal;   there were no regional wall motion abnormalities. Doppler   parameters are consistent with abnormal left ventricular   relaxation (grade 1 diastolic dysfunction). Doppler parameters   are consistent with high ventricular filling pressure. - Aortic valve: Severely calcified annulus. Severely calcified   leaflets. There was moderate to severe stenosis. Peak velocity   (S): 308 cm/s. Mean gradient (S): 21 mm Hg. Valve area (VTI):   1.03 cm^2. Valve area (Vmax): 0.95 cm^2. Valve area (Vmean): 0.94   cm^2. - Mitral valve: Calcified annulus. Mildly calcified leaflets .   There was mild regurgitation. - Atrial septum: No defect or patent foramen ovale was identified. - Tricuspid valve: There was mild-moderate regurgitation. - Pulmonary arteries: PA peak pressure: 32 mm Hg (S).  Patient Profile     Deborah Hendricks is a delightful 72 yo woman with PMH PVD s/p left AKA and right TMA, ESRD on HD T/Th/Sat, DM2, hypertension, hyperlipidemia, atrial fibrillation  not on anticoagulation, CVAx2, GERD, OSA not on CPAP who is seen in consult at the request of Dr. Sherry Ruffing for syncope and concern for heart block as cause.We unfortunately do not have strips during the 6 second pause. She does have a period of time captured on EMS strips with bradycardia to the 30-40 bpm range, but the baseline makes it difficult to interpret the p-wave pattern. It is also unknown what was occurring at the time, IE was she retching/straining etc that might have affected her vagal tone.  Assessment & Plan    1. Syncope --Reported 6-second pause with EMS (no strip available) - while she does have underlying conduction disease, suspect episode was vagally-mediated. - DC coreg. She maintained sinus rhythm at rate of 70s with rare PVCs. No Bradycardia noted.  - No need to 30 days monitor currently.  -Echo this admission showed LVEF of 65-70% with grade 1 DD.    2. PAF - Maintained sinus rhythm. Not on anticoagulation due to hx  of fall.  - Watch for any tachybrady syndrome.  3. PAD s/p L AKA - Followed by vascular surgery  4. Aortic stenosis - echo 9/17 mean gradient 39mmHG - Echo this admission showed LVEF of 65-70% with grade 1 DD. Severely calcified aortic  leaflets. There was moderate to severe stenosis. Mean gradient (S): 21 mm Hg -follow with serial echo yearly.   Will schedule outpatient follow up.     For questions or updates, please contact [place] Please consult [email or web address] for contact info under Cardiology/STEMI.      Signed, Leanor Kail, PA  12/16/2017, 11:01 AM

## 2017-12-16 NOTE — NC FL2 (Signed)
[place] LEVEL OF CARE SCREENING TOOL     IDENTIFICATION  Patient Name: Deborah Hendricks Birthdate: Jan 12, 1946 Sex: female Admission Date (Current Location): 12/13/2017  [place] and [place] Number:  Kathleen Argue [id number] [place] and Address:  [facility] [place]. [facility], [place] [address] [place], [place] [address]      Provider Number: [id number]  Attending Physician Name and Address:  Tawni Millers,*  Relative Name and Phone Number:  Josie Saunders - daughter - [phone]; Ilda Basset - [phone]    Current Level of Care: Hospital Recommended Level of Care: Skilled Nursing Facility(From [facility]) Prior Approval Number:    Date Approved/Denied:   PASRR Number: [id number](Eff. 09/21/15)  Discharge Plan: SNF    Current Diagnoses: Patient Active Problem List   Diagnosis Date Noted  . Nonrheumatic aortic valve stenosis   . Sinus pause   . Syncope and collapse 12/14/2017  . Syncope   . AV block, 1st degree   . Hypertension   . Hypertensive urgency 11/09/2017  . Ischemia of right lower extremity 06/14/2017  . Hyperkalemia 06/05/2017  . Bleeding due to dialysis catheter placement ([place]) 06/05/2017  . History of hysterectomy 03/09/2017  . Former smoker 03/09/2017  . Dysphagia 03/09/2017  . Carotid stenosis 03/09/2017  . Need for assistance due to reduced mobility 03/09/2017  . Complication of vascular access for dialysis 03/09/2017  . Anemia in chronic kidney disease (CKD) 03/09/2017  . Sleep apnea   . Refusal of blood transfusions as patient is Jehovah's Witness   . Constipation   . GERD (gastroesophageal reflux disease)   . HLD (hyperlipidemia)   . TIA (transient ischemic attack) 02/01/2017  . Diabetic ulcer of toe of right foot associated with diabetes mellitus due to underlying condition, limited to breakdown of skin ([place])   . PVD (peripheral vascular disease) ([place]) 11/27/2016  . Blurry vision   . Asymmetric  SNHL (sensorineural hearing loss) 06/12/2016  . Peripheral neuropathy 06/11/2016  . History of pulmonary embolism 06/08/2016  . History of CVA with residual deficit 06/08/2016  . History of borderline personality disorder 04/19/2016  . History of medication noncompliance 04/19/2016  . Paroxysmal atrial fibrillation ([place]) 04/19/2016  . Seizure disorder ([place]) 04/19/2016  . Renovascular hypertension 04/13/2016  . ESRD on dialysis ([place]) 04/11/2016  . Generalized anxiety disorder 04/11/2016  . Major depression, recurrent, chronic ([place]) 04/11/2016  . S/P AKA (above knee amputation) unilateral, left ([place]) 04/11/2016  . PAD (peripheral artery disease) ([place]) 12/15/2015  . Type II diabetes mellitus with neurological manifestations ([place]) 10/21/2015  . Seizures ([place]) 04/13/2015    Orientation RESPIRATION BLADDER Height & Weight     Self, Situation, Place  Normal External catheter(Catheter placed 12/15/17) Weight: 149 lb 4 oz (67.7 kg) Height:  5\' 6"  (167.6 cm)  BEHAVIORAL SYMPTOMS/MOOD NEUROLOGICAL BOWEL NUTRITION STATUS    Convulsions/Seizures(History seizures) Continent Diet(Low sodium - Heart healthy)  AMBULATORY STATUS COMMUNICATION OF NEEDS Skin   Total Care(Left AKA and amputated toes on right foot) Verbally Other (Comment), Skin abrasions(Skin tear right knee with foam dressing)                       Personal Care Assistance Level of Assistance  Bathing, Feeding, Dressing Bathing Assistance: Limited assistance Feeding assistance: Independent Dressing Assistance: Limited assistance     Functional Limitations Info  Sight, Hearing, Speech Sight Info: Adequate Hearing Info: Adequate Speech Info: Adequate    SPECIAL CARE FACTORS FREQUENCY  Contractures Contractures Info: Not present    Additional Factors Info  Insulin Sliding Scale, Code Status, Allergies Code Status Info: DNR Allergies Info: Penicillins, Sulfa Antibiotics, Amoxicillin, Latex    Insulin Sliding Scale Info: Insulin 0-9 Units 3 times a day with meals       Current Medications (12/16/2017):  This is the current hospital active medication list Current Facility-Administered Medications  Medication Dose Route Frequency Provider Last Rate Last Dose  . 0.9 %  sodium chloride infusion  100 mL Intravenous PRN Penninger, Ria Comment, PA      . 0.9 %  sodium chloride infusion  100 mL Intravenous PRN Penninger, Ria Comment, PA      . acetaminophen (TYLENOL) tablet 650 mg  650 mg Oral Q6H PRN Blount, Xenia T, NP   650 mg at 12/16/17 1022  . alteplase (CATHFLO ACTIVASE) injection 2 mg  2 mg Intracatheter Once PRN Penninger, Ria Comment, PA      . amLODipine (NORVASC) tablet 10 mg  10 mg Oral Daily Tamala Julian, Rondell A, MD   10 mg at 12/16/17 0841  . aspirin EC tablet 81 mg  81 mg Oral Daily Tamala Julian, Rondell A, MD   81 mg at 12/16/17 0841  . atorvastatin (LIPITOR) tablet 40 mg  40 mg Oral Daily Tamala Julian, Rondell A, MD   40 mg at 12/16/17 0840  . doxercalciferol (HECTOROL) injection 1 mcg  1 mcg Intravenous Q T,Th,Sa-HD Penninger, Lindsay, PA   1 mcg at 12/14/17 1725  . heparin injection 1,000 Units  1,000 Units Dialysis PRN Penninger, Ria Comment, PA      . heparin injection 5,000 Units  5,000 Units Subcutaneous Q8H Smith, Rondell A, MD   5,000 Units at 12/16/17 1321  . heparin injection 5,000 Units  5,000 Units Dialysis PRN Penninger, Ria Comment, PA      . hydrALAZINE (APRESOLINE) tablet 10 mg  10 mg Oral Q8H Smith, Rondell A, MD   10 mg at 12/16/17 1321  . insulin aspart (novoLOG) injection 0-9 Units  0-9 Units Subcutaneous TID WC Norval Morton, MD   2 Units at 12/14/17 1202  . levETIRAcetam (KEPPRA) tablet 500 mg  500 mg Oral BID Fuller Plan A, MD   500 mg at 12/16/17 0842  . lidocaine (PF) (XYLOCAINE) 1 % injection 5 mL  5 mL Intradermal PRN Penninger, Ria Comment, PA      . lidocaine-prilocaine (EMLA) cream 1 application  1 application Topical PRN Penninger, Ria Comment, PA      . ondansetron (ZOFRAN)  injection 4 mg  4 mg Intravenous Q4H PRN Arrien, Jimmy Picket, MD   4 mg at 12/15/17 2144  . pantoprazole (PROTONIX) injection 40 mg  40 mg Intravenous Q12H Arrien, Jimmy Picket, MD   40 mg at 12/16/17 [phone]  . pentafluoroprop-tetrafluoroeth (GEBAUERS) aerosol 1 application  1 application Topical PRN Penninger, Ria Comment, PA      . sucralfate (CARAFATE) 1 GM/10ML suspension 1 g  1 g Oral TID WC & HS Arrien, Jimmy Picket, MD   1 g at 12/16/17 1321  . traZODone (DESYREL) tablet 50 mg  50 mg Oral QHS PRN Neila Gear, NP   50 mg at 12/15/17 2144     Discharge Medications: Please see discharge summary for a list of discharge medications.  Relevant Imaging Results:  Relevant Lab Results:   Additional Information [phone].  Dialysis patient TTS at [facility], [place], [place]

## 2017-12-16 NOTE — Progress Notes (Signed)
[facility] Rounding Note  Subjective/Interval History:  In good spirits, going home today  Objective Vital signs in last 24 hours: Vitals:   12/15/17 1600 12/15/17 2043 12/16/17 0456 12/16/17 1005  BP: 114/64 (!) 142/79 128/60 (!) 111/57  Pulse: 72 80 74 73  Resp: 18 19 20 19   Temp: 98.2 F (36.8 C) 98.6 F (37 C) 98.6 F (37 C) 98.2 F (36.8 C)  TempSrc: Oral Oral Oral Oral  SpO2: 96% 98% 98% 99%  Weight: 67.9 kg (149 lb 11.1 oz) 67.7 kg (149 lb 4 oz)    Height: 5\' 6"  (1.676 m)      Weight change: -1.1 kg (-6.8 oz)  Intake/Output Summary (Last 24 hours) at 12/16/2017 1352 Last data filed at 12/16/2017 1008 Gross per 24 hour  Intake 600 ml  Output 150 ml  Net 450 ml   Physical Exam:  Blood pressure (!) 111/57, pulse 73, temperature 98.2 F (36.8 C), temperature source Oral, resp. rate 19, height 5\' 6"  (1.676 m), weight 67.7 kg (149 lb 4 oz), SpO2 99 %. NAD, Pleasant, chronically ill appearing, elderly female Speaks slowly, deliberately but fully oriented No JVD Lungs clear without crackles or wheezes Regular rhythm. [id number] No S3 2/6 systolic murmur USB No diastolic murmur or rub Abd soft and non-tender Has L AKA - no stump edema RLE no edema, R TMA Old nonfunction AVG L arm Dialysis Access: R thigh [facility]   Recent Labs  Lab 12/13/17 1944 12/14/17 0201 12/15/17 0439 12/16/17 0716  NA 136 136 135 135  K 5.0 5.1 4.1 4.3  CL 99* 100* 100* 98*  CO2 23 23 24 24   GLUCOSE 152* 134* 59* 93  BUN 54* 57* 22* 30*  CREATININE 7.36* 7.48* 4.57* 5.78*  CALCIUM 9.5 9.3 8.2* 9.1  PHOS  --  2.4*  --  2.4*    Recent Labs  Lab 12/14/17 0201 12/16/17 0716  ALBUMIN 3.4* 3.1*    Recent Labs  Lab 12/13/17 1944 12/14/17 0201  WBC 6.9 9.4  NEUTROABS 4.2  --   HGB 11.8* 11.2*  HCT 37.5 36.0  MCV 89.3 89.6  PLT 197 175    Recent Labs  Lab 12/14/17 0201 12/14/17 0752 12/14/17 1427  TROPONINI <0.03 <0.03 <0.03    Recent Labs  Lab 12/15/17 1201 12/15/17 1552 12/15/17 2042  12/16/17 0754 12/16/17 1151  GLUCAP 91 119* 87 87 105*   Studies/Results: No results found. Medications: . sodium chloride    . sodium chloride     . amLODipine  10 mg Oral Daily  . aspirin EC  81 mg Oral Daily  . atorvastatin  40 mg Oral Daily  . doxercalciferol  1 mcg Intravenous Q T,Th,Sa-HD  . heparin  5,000 Units Subcutaneous Q8H  . hydrALAZINE  10 mg Oral Q8H  . insulin aspart  0-9 Units Subcutaneous TID WC  . levETIRAcetam  500 mg Oral BID  . pantoprazole (PROTONIX) IV  40 mg Intravenous Q12H  . sucralfate  1 g Oral TID WC & HS   Dialysis Orders:  [facility] 4hrs 400/800 EDW 66.5kg, 2K/2Ca Profile 4, Linear Na R thigh TDC  Hectorol 64mcg IV qHD Heparin 5000 unit bolus qHD  Background: ESRD (TTS), DM2, HTN, dCHF, h/o stroke with seizures. Admitted post LOC, EMS noted bradycardia and 6 second pause (no hard copy). Initial eval here sinus tachy, first degree AVB, CT neg for new stroke. Admitted for cards eval and tele monitoring.   Assessment/Plan: 1. Syncope: ECHO pending. Cardiology  following. Recommending continuing to hold carvedilol and monitoring on tele. SR w/1st degree AVB now. They suspect a vagally mediated event.  2. ESRD -  TTS, continue per regular schedule. Poor outpt HD compliance (missed 2 TMT's PTA, shortened the only 1 she attended). Had HD here Saturday,weight 69->67 3. Nausea - nauseous but tolerating clears at this time. Getting zofran and carafate (must limit use of the latter d/t aluminum content)  4. Vascular access - has only R TDC thigh. I can't tell what future access plan is. Will review outpt notes from her HD unit 5. Anemia  - Hgb 11.2. No indication for Aranesp or EPO 6. Secondary Hyperparathyroidism -  Ca in goal, P low. Holding binders. Continue hectorol with dialysis. 7. HTN - meds/volume control. Excellent at this time 8. Nutrition - Renal diet, Nepro TID 9. Seizures - Keppra 10. Multiple CVA's - CT unremarkable 11. CHF - per  cardio 12. Depression - per primary 13. Dispo - ok for dc from renal [facility]   Kelly Splinter MD [employer] pgr [phone]   12/16/2017, 1:53 PM

## 2017-12-17 DIAGNOSIS — E875 Hyperkalemia: Secondary | ICD-10-CM | POA: Diagnosis not present

## 2017-12-17 DIAGNOSIS — D500 Iron deficiency anemia secondary to blood loss (chronic): Secondary | ICD-10-CM | POA: Diagnosis not present

## 2017-12-17 DIAGNOSIS — E119 Type 2 diabetes mellitus without complications: Secondary | ICD-10-CM | POA: Diagnosis not present

## 2017-12-17 DIAGNOSIS — N186 End stage renal disease: Secondary | ICD-10-CM | POA: Diagnosis not present

## 2017-12-17 DIAGNOSIS — G40909 Epilepsy, unspecified, not intractable, without status epilepticus: Secondary | ICD-10-CM | POA: Diagnosis not present

## 2017-12-17 DIAGNOSIS — I10 Essential (primary) hypertension: Secondary | ICD-10-CM | POA: Diagnosis not present

## 2017-12-17 DIAGNOSIS — D631 Anemia in chronic kidney disease: Secondary | ICD-10-CM | POA: Diagnosis not present

## 2017-12-17 DIAGNOSIS — R41841 Cognitive communication deficit: Secondary | ICD-10-CM | POA: Diagnosis not present

## 2017-12-17 DIAGNOSIS — F0390 Unspecified dementia without behavioral disturbance: Secondary | ICD-10-CM | POA: Diagnosis not present

## 2017-12-17 DIAGNOSIS — R55 Syncope and collapse: Secondary | ICD-10-CM | POA: Diagnosis not present

## 2017-12-17 DIAGNOSIS — N2581 Secondary hyperparathyroidism of renal origin: Secondary | ICD-10-CM | POA: Diagnosis not present

## 2017-12-18 DIAGNOSIS — R55 Syncope and collapse: Secondary | ICD-10-CM | POA: Diagnosis not present

## 2017-12-18 DIAGNOSIS — E875 Hyperkalemia: Secondary | ICD-10-CM | POA: Diagnosis not present

## 2017-12-18 DIAGNOSIS — R41841 Cognitive communication deficit: Secondary | ICD-10-CM | POA: Diagnosis not present

## 2017-12-19 DIAGNOSIS — R41841 Cognitive communication deficit: Secondary | ICD-10-CM | POA: Diagnosis not present

## 2017-12-19 DIAGNOSIS — E875 Hyperkalemia: Secondary | ICD-10-CM | POA: Diagnosis not present

## 2017-12-19 DIAGNOSIS — D631 Anemia in chronic kidney disease: Secondary | ICD-10-CM | POA: Diagnosis not present

## 2017-12-19 DIAGNOSIS — N2581 Secondary hyperparathyroidism of renal origin: Secondary | ICD-10-CM | POA: Diagnosis not present

## 2017-12-19 DIAGNOSIS — N186 End stage renal disease: Secondary | ICD-10-CM | POA: Diagnosis not present

## 2017-12-19 DIAGNOSIS — D500 Iron deficiency anemia secondary to blood loss (chronic): Secondary | ICD-10-CM | POA: Diagnosis not present

## 2017-12-19 DIAGNOSIS — R55 Syncope and collapse: Secondary | ICD-10-CM | POA: Diagnosis not present

## 2017-12-20 DIAGNOSIS — R41841 Cognitive communication deficit: Secondary | ICD-10-CM | POA: Diagnosis not present

## 2017-12-20 DIAGNOSIS — E875 Hyperkalemia: Secondary | ICD-10-CM | POA: Diagnosis not present

## 2017-12-20 DIAGNOSIS — R55 Syncope and collapse: Secondary | ICD-10-CM | POA: Diagnosis not present

## 2017-12-21 DIAGNOSIS — D500 Iron deficiency anemia secondary to blood loss (chronic): Secondary | ICD-10-CM | POA: Diagnosis not present

## 2017-12-21 DIAGNOSIS — D631 Anemia in chronic kidney disease: Secondary | ICD-10-CM | POA: Diagnosis not present

## 2017-12-21 DIAGNOSIS — N186 End stage renal disease: Secondary | ICD-10-CM | POA: Diagnosis not present

## 2017-12-21 DIAGNOSIS — N2581 Secondary hyperparathyroidism of renal origin: Secondary | ICD-10-CM | POA: Diagnosis not present

## 2017-12-23 ENCOUNTER — Encounter (HOSPITAL_COMMUNITY): Payer: Medicare Other

## 2017-12-23 ENCOUNTER — Ambulatory Visit: Payer: Medicare Other | Admitting: Surgery

## 2017-12-23 DIAGNOSIS — R41841 Cognitive communication deficit: Secondary | ICD-10-CM | POA: Diagnosis not present

## 2017-12-23 DIAGNOSIS — E875 Hyperkalemia: Secondary | ICD-10-CM | POA: Diagnosis not present

## 2017-12-23 DIAGNOSIS — R55 Syncope and collapse: Secondary | ICD-10-CM | POA: Diagnosis not present

## 2017-12-24 DIAGNOSIS — D500 Iron deficiency anemia secondary to blood loss (chronic): Secondary | ICD-10-CM | POA: Diagnosis not present

## 2017-12-24 DIAGNOSIS — N2581 Secondary hyperparathyroidism of renal origin: Secondary | ICD-10-CM | POA: Diagnosis not present

## 2017-12-24 DIAGNOSIS — R41841 Cognitive communication deficit: Secondary | ICD-10-CM | POA: Diagnosis not present

## 2017-12-24 DIAGNOSIS — D631 Anemia in chronic kidney disease: Secondary | ICD-10-CM | POA: Diagnosis not present

## 2017-12-24 DIAGNOSIS — N186 End stage renal disease: Secondary | ICD-10-CM | POA: Diagnosis not present

## 2017-12-24 DIAGNOSIS — R55 Syncope and collapse: Secondary | ICD-10-CM | POA: Diagnosis not present

## 2017-12-24 DIAGNOSIS — E875 Hyperkalemia: Secondary | ICD-10-CM | POA: Diagnosis not present

## 2017-12-25 DIAGNOSIS — R41841 Cognitive communication deficit: Secondary | ICD-10-CM | POA: Diagnosis not present

## 2017-12-25 DIAGNOSIS — R55 Syncope and collapse: Secondary | ICD-10-CM | POA: Diagnosis not present

## 2017-12-25 DIAGNOSIS — E875 Hyperkalemia: Secondary | ICD-10-CM | POA: Diagnosis not present

## 2017-12-26 DIAGNOSIS — R55 Syncope and collapse: Secondary | ICD-10-CM | POA: Diagnosis not present

## 2017-12-26 DIAGNOSIS — D631 Anemia in chronic kidney disease: Secondary | ICD-10-CM | POA: Diagnosis not present

## 2017-12-26 DIAGNOSIS — R41841 Cognitive communication deficit: Secondary | ICD-10-CM | POA: Diagnosis not present

## 2017-12-26 DIAGNOSIS — N2581 Secondary hyperparathyroidism of renal origin: Secondary | ICD-10-CM | POA: Diagnosis not present

## 2017-12-26 DIAGNOSIS — D500 Iron deficiency anemia secondary to blood loss (chronic): Secondary | ICD-10-CM | POA: Diagnosis not present

## 2017-12-26 DIAGNOSIS — N186 End stage renal disease: Secondary | ICD-10-CM | POA: Diagnosis not present

## 2017-12-26 DIAGNOSIS — E875 Hyperkalemia: Secondary | ICD-10-CM | POA: Diagnosis not present

## 2017-12-27 DIAGNOSIS — R41841 Cognitive communication deficit: Secondary | ICD-10-CM | POA: Diagnosis not present

## 2017-12-27 DIAGNOSIS — R55 Syncope and collapse: Secondary | ICD-10-CM | POA: Diagnosis not present

## 2017-12-27 DIAGNOSIS — E875 Hyperkalemia: Secondary | ICD-10-CM | POA: Diagnosis not present

## 2017-12-28 DIAGNOSIS — N186 End stage renal disease: Secondary | ICD-10-CM | POA: Diagnosis not present

## 2017-12-28 DIAGNOSIS — D500 Iron deficiency anemia secondary to blood loss (chronic): Secondary | ICD-10-CM | POA: Diagnosis not present

## 2017-12-28 DIAGNOSIS — E875 Hyperkalemia: Secondary | ICD-10-CM | POA: Diagnosis not present

## 2017-12-28 DIAGNOSIS — R55 Syncope and collapse: Secondary | ICD-10-CM | POA: Diagnosis not present

## 2017-12-28 DIAGNOSIS — N2581 Secondary hyperparathyroidism of renal origin: Secondary | ICD-10-CM | POA: Diagnosis not present

## 2017-12-28 DIAGNOSIS — D631 Anemia in chronic kidney disease: Secondary | ICD-10-CM | POA: Diagnosis not present

## 2017-12-28 DIAGNOSIS — R41841 Cognitive communication deficit: Secondary | ICD-10-CM | POA: Diagnosis not present

## 2017-12-30 DIAGNOSIS — E875 Hyperkalemia: Secondary | ICD-10-CM | POA: Diagnosis not present

## 2017-12-30 DIAGNOSIS — R41841 Cognitive communication deficit: Secondary | ICD-10-CM | POA: Diagnosis not present

## 2017-12-30 DIAGNOSIS — R55 Syncope and collapse: Secondary | ICD-10-CM | POA: Diagnosis not present

## 2017-12-31 ENCOUNTER — Encounter: Payer: Self-pay | Admitting: Physician Assistant

## 2017-12-31 DIAGNOSIS — R41841 Cognitive communication deficit: Secondary | ICD-10-CM | POA: Diagnosis not present

## 2017-12-31 DIAGNOSIS — N2581 Secondary hyperparathyroidism of renal origin: Secondary | ICD-10-CM | POA: Diagnosis not present

## 2017-12-31 DIAGNOSIS — N186 End stage renal disease: Secondary | ICD-10-CM | POA: Diagnosis not present

## 2017-12-31 DIAGNOSIS — D631 Anemia in chronic kidney disease: Secondary | ICD-10-CM | POA: Diagnosis not present

## 2017-12-31 DIAGNOSIS — E875 Hyperkalemia: Secondary | ICD-10-CM | POA: Diagnosis not present

## 2017-12-31 DIAGNOSIS — D500 Iron deficiency anemia secondary to blood loss (chronic): Secondary | ICD-10-CM | POA: Diagnosis not present

## 2017-12-31 DIAGNOSIS — R55 Syncope and collapse: Secondary | ICD-10-CM | POA: Diagnosis not present

## 2018-01-01 DIAGNOSIS — E875 Hyperkalemia: Secondary | ICD-10-CM | POA: Diagnosis not present

## 2018-01-01 DIAGNOSIS — R41841 Cognitive communication deficit: Secondary | ICD-10-CM | POA: Diagnosis not present

## 2018-01-01 DIAGNOSIS — R55 Syncope and collapse: Secondary | ICD-10-CM | POA: Diagnosis not present

## 2018-01-03 DIAGNOSIS — R55 Syncope and collapse: Secondary | ICD-10-CM | POA: Diagnosis not present

## 2018-01-03 DIAGNOSIS — E875 Hyperkalemia: Secondary | ICD-10-CM | POA: Diagnosis not present

## 2018-01-03 DIAGNOSIS — R41841 Cognitive communication deficit: Secondary | ICD-10-CM | POA: Diagnosis not present

## 2018-01-04 DIAGNOSIS — N186 End stage renal disease: Secondary | ICD-10-CM | POA: Diagnosis not present

## 2018-01-04 DIAGNOSIS — D631 Anemia in chronic kidney disease: Secondary | ICD-10-CM | POA: Diagnosis not present

## 2018-01-04 DIAGNOSIS — D500 Iron deficiency anemia secondary to blood loss (chronic): Secondary | ICD-10-CM | POA: Diagnosis not present

## 2018-01-04 DIAGNOSIS — E875 Hyperkalemia: Secondary | ICD-10-CM | POA: Diagnosis not present

## 2018-01-04 DIAGNOSIS — N2581 Secondary hyperparathyroidism of renal origin: Secondary | ICD-10-CM | POA: Diagnosis not present

## 2018-01-04 DIAGNOSIS — R55 Syncope and collapse: Secondary | ICD-10-CM | POA: Diagnosis not present

## 2018-01-04 DIAGNOSIS — R41841 Cognitive communication deficit: Secondary | ICD-10-CM | POA: Diagnosis not present

## 2018-01-06 DIAGNOSIS — R55 Syncope and collapse: Secondary | ICD-10-CM | POA: Diagnosis not present

## 2018-01-06 DIAGNOSIS — R41841 Cognitive communication deficit: Secondary | ICD-10-CM | POA: Diagnosis not present

## 2018-01-06 DIAGNOSIS — E875 Hyperkalemia: Secondary | ICD-10-CM | POA: Diagnosis not present

## 2018-01-08 DIAGNOSIS — R41841 Cognitive communication deficit: Secondary | ICD-10-CM | POA: Diagnosis not present

## 2018-01-08 DIAGNOSIS — E875 Hyperkalemia: Secondary | ICD-10-CM | POA: Diagnosis not present

## 2018-01-08 DIAGNOSIS — R55 Syncope and collapse: Secondary | ICD-10-CM | POA: Diagnosis not present

## 2018-01-10 ENCOUNTER — Encounter: Payer: Self-pay | Admitting: Cardiovascular Disease

## 2018-01-10 ENCOUNTER — Ambulatory Visit (INDEPENDENT_AMBULATORY_CARE_PROVIDER_SITE_OTHER): Payer: Medicare Other | Admitting: Cardiovascular Disease

## 2018-01-10 VITALS — BP 117/66 | HR 80

## 2018-01-10 DIAGNOSIS — R293 Abnormal posture: Secondary | ICD-10-CM | POA: Diagnosis not present

## 2018-01-10 DIAGNOSIS — R55 Syncope and collapse: Secondary | ICD-10-CM

## 2018-01-10 DIAGNOSIS — R0989 Other specified symptoms and signs involving the circulatory and respiratory systems: Secondary | ICD-10-CM | POA: Diagnosis not present

## 2018-01-10 DIAGNOSIS — I350 Nonrheumatic aortic (valve) stenosis: Secondary | ICD-10-CM | POA: Diagnosis not present

## 2018-01-10 DIAGNOSIS — S88919S Complete traumatic amputation of unspecified lower leg, level unspecified, sequela: Secondary | ICD-10-CM | POA: Diagnosis not present

## 2018-01-10 NOTE — Patient Instructions (Addendum)
Medication Instructions: Your physician recommends that you continue on your current medications as directed. Please refer to the Current Medication list given to you today.   Testing/Procedures: Your physician has requested that you have a carotid duplex. This test is an ultrasound of the carotid arteries in your neck. It looks at blood flow through these arteries that supply the brain with blood. Allow one hour for this exam. There are no restrictions or special instructions.   Your physician has recommended that you wear a 30 day event monitor. Event monitors are medical devices that record the heart's electrical activity. Doctors most often [place] these monitors to diagnose arrhythmias. Arrhythmias are problems with the speed or rhythm of the heartbeat. The monitor is a small, portable device. You can wear one while you do your normal daily activities. This is usually used to diagnose what is causing palpitations/syncope (passing out).  Your physician has requested that you have an echocardiogram in 1 year. Echocardiography is a painless test that uses sound waves to create images of your heart. It provides your doctor with information about the size and shape of your heart and how well your heart's chambers and valves are working. This procedure takes approximately one hour. There are no restrictions for this procedure.   Follow-Up: We request that you follow-up in: 3 months with an extender and in 12 months with Dr Andria Rhein will receive a reminder letter in the mail two months in advance. If you don't receive a letter, please call our office to schedule the follow-up appointment.  If you need a refill on your cardiac medications before your next appointment, please call your pharmacy.

## 2018-01-10 NOTE — Assessment & Plan Note (Signed)
Recent admission for syncope 12/13/17. She did have a 2-D echo that showed moderate aortic stenosis which we will need to follow-up on. I'm going to order a 30 day event monitor to rule out a arrhythmogenic cause.

## 2018-01-10 NOTE — Assessment & Plan Note (Signed)
Moderate aortic stenosis by recent 2-D echo performed 12/15/17 with a valve area measured at 0.95 cm and a peak gradient of 35 mm symmetry. This will be repeated in 12 months

## 2018-01-10 NOTE — Assessment & Plan Note (Signed)
History of critical limb ischemia status post left BKA with several multiple procedures subsequent to that on the right leg by Dr. Trula Slade and Dr. Donzetta Matters for limb salvage.

## 2018-01-10 NOTE — Progress Notes (Signed)
01/10/2018 Deborah Hendricks   1946/10/13  [id number]  Primary Physician [name], MD Primary Cardiologist: Lorretta Harp MD [facility], [place], [place]  HPI:  Deborah Hendricks is a 72 y.o.  mother of 2, grandmother 2 grandchildren who currently lives with her daughter Deborah Hendricks who accompanies her today. She was referred by Dr. Jacqualyn Posey, her podiatrist for evaluation of potential treatment of critical limb ischemia. I last saw her in the office 11/16/15. Her cardiovascular risk factor profile is notable for 25 pack years of tobacco abuse having quit 4 years ago, treated hypertension, diabetes and hyperlipidemia. She has had 2 strokes the past. She's never had a heart attack and specifically denies chest pain, shortness of breath or claudication. She has chronic renal insufficiency on dialysis last 2 years with multiple failed fistulas that with a permacath in her right thigh. She has nonhealing wounds on her left first and fifth toes which are not healing. Dopplers suggest a left ABI 0.46 with occluded left popliteal and TIBIAL VESSELS. I performed angiography on her 11/28/15 revealing an occluded left popliteal artery with 0 vessel runoff. There were no real vascular blurry vision options that she'll be underwent left above-the-knee amputation by Dr. Trula Slade 12/15/15.  Because of recurrent issues with her right leg she underwent endovascular procedures by Dr. Trula Slade and Dr. Donzetta Matters for limb salvage. She was admitted to the hospital on 12/13/17 for 4 days because of syncope. A 2-D echo revealed moderate aortic stenosis. An etiology was not determined and she was discharged with intent to obtain a 30 day event monitor.     Current Meds  Medication Sig  . Amino Acids-Protein Hydrolys (FEEDING SUPPLEMENT, PRO-STAT SUGAR FREE 64,) LIQD Take 30 mLs by mouth 2 (two) times daily. (0900 & 2100)  . amLODipine (NORVASC) 10 MG tablet Take 1 tablet (10 mg total) by mouth daily.  [name] aspirin EC 81 MG tablet Take 81  mg by mouth daily.  [name] atorvastatin (LIPITOR) 40 MG tablet Take 1 tablet (40 mg total) by mouth daily.  . calcium acetate (PHOSLO) 667 MG capsule Take 2,001 mg by mouth 3 (three) times daily with meals.   . citalopram (CELEXA) 10 MG tablet Take 10 mg by mouth daily. (0900)  . esomeprazole (NEXIUM) 20 MG capsule Take 1 capsule (20 mg total) by mouth daily.  [name] gabapentin (NEURONTIN) 100 MG capsule Take 1 capsule (100 mg total) by mouth at bedtime.  . hydrALAZINE (APRESOLINE) 10 MG tablet Take 1 tablet (10 mg total) by mouth every 8 (eight) hours.  . insulin aspart (NOVOLOG) 100 UNIT/ML injection Inject 2-10 Units into the skin 3 (three) times daily as needed for high blood sugar. 151-200=2 units, 201-250=4 units, 251-300=6 units, 301-350=8 units, 351-400=10 units, >400=10 units and Call MD  . levETIRAcetam (KEPPRA) 500 MG tablet Take 1 tablet (500 mg total) by mouth 2 (two) times daily.  . Multiple Vitamin (MULTIVITAMIN) tablet Take 1 tablet by mouth daily.  . Nutritional Supplements (FEEDING SUPPLEMENT, NEPRO CARB STEADY,) LIQD Take 237 mLs by mouth daily with lunch.  . ondansetron (ZOFRAN ODT) 4 MG disintegrating tablet Take 1 tablet (4 mg total) by mouth every 8 (eight) hours as needed for nausea or vomiting.  . polyethylene glycol powder (GLYCOLAX/MIRALAX) powder Take 17 g by mouth at bedtime. HOLD FOR LOOSE STOOLS   . psyllium (REGULOID) 0.52 g capsule One po tid prn constipation. (Patient taking differently: Take 0.52 g by mouth 3 (three) times daily as needed (for  constipation.). )     Allergies  Allergen Reactions  . Penicillins Hives, Itching, Rash and Other (See Comments)    Has patient had a PCN reaction causing immediate rash, facial/tongue/throat swelling, SOB or lightheadedness with hypotension: Yes Has patient had a PCN reaction causing severe rash involving mucus membranes or skin necrosis:unknown MAR source Has patient had a PCN reaction that required hospitalization:unknown MAR  source Has patient had a PCN reaction occurring within the last 10 years:unknown MAR source If all of the above answers are "NO", then may proceed with Cephalosporin use.  . Sulfa Antibiotics     UNSPECIFIED REACTION   . Amoxicillin Hives, Itching and Rash    Has patient had a PCN reaction causing immediate rash, facial/tongue/throat swelling, SOB or lightheadedness with hypotension:unknown MAR source Has patient had a PCN reaction causing severe rash involving mucus membranes or skin necrosis:unknown MAR source Has patient had a PCN reaction that required hospitalization:unknown MAR source Has patient had a PCN reaction occurring within the last 10 years: unknown MAR source If all of the above answers are "NO", then may proceed with Cephalosporin use.   . Latex Rash    Social History   Socioeconomic History  . Marital status: Single    Spouse name: Not on file  . Number of children: Not on file  . Years of education: Not on file  . Highest education level: Not on file  Social Needs  . Financial resource strain: Not on file  . Food insecurity - worry: Not on file  . Food insecurity - inability: Not on file  . Transportation needs - medical: Not on file  . Transportation needs - non-medical: Not on file  Occupational History  . Not on file  Tobacco Use  . Smoking status: Former Smoker    Packs/day: 0.25    Years: 51.00    Pack years: 12.75    Types: Cigarettes    Last attempt to quit: 04/13/2015    Years since quitting: 2.7  . Smokeless tobacco: Never Used  Substance and Sexual Activity  . Alcohol use: Yes    Alcohol/week: 0.0 oz    Comment: 11/27/2016 "stopped in 04/2015"  . Drug use: No  . Sexual activity: No  Other Topics Concern  . Not on file  Social History Narrative  . Not on file     Review of Systems: General: negative for chills, fever, night sweats or weight changes.  Cardiovascular: negative for chest pain, dyspnea on exertion, edema, orthopnea,  palpitations, paroxysmal nocturnal dyspnea or shortness of breath Dermatological: negative for rash Respiratory: negative for cough or wheezing Urologic: negative for hematuria Abdominal: negative for nausea, vomiting, diarrhea, bright red blood per rectum, melena, or hematemesis Neurologic: negative for visual changes, syncope, or dizziness All other systems reviewed and are otherwise negative except as noted above.    Blood pressure 117/66, pulse 80.  General appearance: alert and no distress Neck: no adenopathy, no JVD, supple, symmetrical, trachea midline, thyroid not enlarged, symmetric, no tenderness/mass/nodules and Soft bruits bilaterally Lungs: clear to auscultation bilaterally Heart: 2/6 outflow tract murmur consistent with aortic stenosis. Extremities: Left BKA Pulses: Absent pedal pulses Skin: Skin color, texture, turgor normal. No rashes or lesions Neurologic: Alert and oriented X 3, normal strength and tone. Normal symmetric reflexes. Normal coordination and gait  EKG not performed today  ASSESSMENT AND PLAN:   PAD (peripheral artery disease) (HCC) History of critical limb ischemia status post left BKA with several multiple procedures subsequent to  that on the right leg by Dr. Trula Slade and Dr. Donzetta Matters for limb salvage.  Syncope Recent admission for syncope 12/13/17. She did have a 2-D echo that showed moderate aortic stenosis which we will need to follow-up on. I'm going to order a 30 day event monitor to rule out a arrhythmogenic cause.  Nonrheumatic aortic valve stenosis Moderate aortic stenosis by recent 2-D echo performed 12/15/17 with a valve area measured at 0.95 cm and a peak gradient of 35 mm symmetry. This will be repeated in 12 months      Lorretta Harp MD [facility], [facility] 01/10/2018 3:06 PM

## 2018-01-11 DIAGNOSIS — N2581 Secondary hyperparathyroidism of renal origin: Secondary | ICD-10-CM | POA: Diagnosis not present

## 2018-01-11 DIAGNOSIS — D500 Iron deficiency anemia secondary to blood loss (chronic): Secondary | ICD-10-CM | POA: Diagnosis not present

## 2018-01-11 DIAGNOSIS — D631 Anemia in chronic kidney disease: Secondary | ICD-10-CM | POA: Diagnosis not present

## 2018-01-11 DIAGNOSIS — N186 End stage renal disease: Secondary | ICD-10-CM | POA: Diagnosis not present

## 2018-01-12 ENCOUNTER — Encounter (HOSPITAL_COMMUNITY): Payer: Self-pay | Admitting: *Deleted

## 2018-01-12 ENCOUNTER — Emergency Department (HOSPITAL_COMMUNITY)
Admission: EM | Admit: 2018-01-12 | Discharge: 2018-01-12 | Disposition: A | Discharge status: Home / Self Care | Payer: Medicare Other | Attending: Emergency Medicine | Admitting: Emergency Medicine

## 2018-01-12 ENCOUNTER — Other Ambulatory Visit: Payer: Self-pay

## 2018-01-12 DIAGNOSIS — Z992 Dependence on renal dialysis: Secondary | ICD-10-CM | POA: Diagnosis not present

## 2018-01-12 DIAGNOSIS — I509 Heart failure, unspecified: Secondary | ICD-10-CM | POA: Insufficient documentation

## 2018-01-12 DIAGNOSIS — Z794 Long term (current) use of insulin: Secondary | ICD-10-CM | POA: Diagnosis not present

## 2018-01-12 DIAGNOSIS — R739 Hyperglycemia, unspecified: Secondary | ICD-10-CM | POA: Diagnosis not present

## 2018-01-12 DIAGNOSIS — Z7902 Long term (current) use of antithrombotics/antiplatelets: Secondary | ICD-10-CM | POA: Insufficient documentation

## 2018-01-12 DIAGNOSIS — R112 Nausea with vomiting, unspecified: Secondary | ICD-10-CM | POA: Insufficient documentation

## 2018-01-12 DIAGNOSIS — N179 Acute kidney failure, unspecified: Secondary | ICD-10-CM | POA: Diagnosis not present

## 2018-01-12 DIAGNOSIS — Z8673 Personal history of transient ischemic attack (TIA), and cerebral infarction without residual deficits: Secondary | ICD-10-CM | POA: Diagnosis not present

## 2018-01-12 DIAGNOSIS — Z89612 Acquired absence of left leg above knee: Secondary | ICD-10-CM | POA: Diagnosis not present

## 2018-01-12 DIAGNOSIS — I132 Hypertensive heart and chronic kidney disease with heart failure and with stage 5 chronic kidney disease, or end stage renal disease: Secondary | ICD-10-CM | POA: Diagnosis not present

## 2018-01-12 DIAGNOSIS — Z89411 Acquired absence of right great toe: Secondary | ICD-10-CM | POA: Insufficient documentation

## 2018-01-12 DIAGNOSIS — R110 Nausea: Secondary | ICD-10-CM | POA: Diagnosis not present

## 2018-01-12 DIAGNOSIS — E1140 Type 2 diabetes mellitus with diabetic neuropathy, unspecified: Secondary | ICD-10-CM | POA: Diagnosis not present

## 2018-01-12 DIAGNOSIS — Z91040 Latex allergy status: Secondary | ICD-10-CM | POA: Diagnosis not present

## 2018-01-12 DIAGNOSIS — N186 End stage renal disease: Secondary | ICD-10-CM | POA: Diagnosis not present

## 2018-01-12 DIAGNOSIS — Z7982 Long term (current) use of aspirin: Secondary | ICD-10-CM | POA: Diagnosis not present

## 2018-01-12 DIAGNOSIS — R100 Acute abdomen: Secondary | ICD-10-CM | POA: Diagnosis not present

## 2018-01-12 DIAGNOSIS — Z87891 Personal history of nicotine dependence: Secondary | ICD-10-CM | POA: Diagnosis not present

## 2018-01-12 DIAGNOSIS — E108 Type 1 diabetes mellitus with unspecified complications: Secondary | ICD-10-CM | POA: Diagnosis not present

## 2018-01-12 LAB — COMPREHENSIVE METABOLIC PANEL
ALT: 17 U/L (ref 14–54)
ANION GAP: 13 (ref 5–15)
AST: 28 U/L (ref 15–41)
Albumin: 3.1 g/dL — ABNORMAL LOW (ref 3.5–5.0)
Alkaline Phosphatase: 140 U/L — ABNORMAL HIGH (ref 38–126)
BUN: 38 mg/dL — ABNORMAL HIGH (ref 6–20)
CO2: 24 mmol/L (ref 22–32)
CREATININE: 5.34 mg/dL — AB (ref 0.44–1.00)
Calcium: 8.9 mg/dL (ref 8.9–10.3)
Chloride: 97 mmol/L — ABNORMAL LOW (ref 101–111)
GFR, EST AFRICAN AMERICAN: 8 mL/min — AB (ref >60)
GFR, EST NON AFRICAN AMERICAN: 7 mL/min — AB (ref >60)
Glucose, Bld: 196 mg/dL — ABNORMAL HIGH (ref 65–99)
Potassium: 4.4 mmol/L (ref 3.5–5.1)
Sodium: 134 mmol/L — ABNORMAL LOW (ref 135–145)
Total Bilirubin: 0.9 mg/dL (ref 0.3–1.2)
Total Protein: 6.7 g/dL (ref 6.5–8.1)

## 2018-01-12 LAB — CBC
HCT: 26.7 % — ABNORMAL LOW (ref 36.0–46.0)
HEMOGLOBIN: 8.4 g/dL — AB (ref 12.0–15.0)
MCH: 27.9 pg (ref 26.0–34.0)
MCHC: 31.5 g/dL (ref 30.0–36.0)
MCV: 88.7 fL (ref 78.0–100.0)
PLATELETS: 246 10*3/uL (ref 150–400)
RBC: 3.01 MIL/uL — AB (ref 3.87–5.11)
RDW: 17.5 % — ABNORMAL HIGH (ref 11.5–15.5)
WBC: 7.9 10*3/uL (ref 4.0–10.5)

## 2018-01-12 LAB — LIPASE, BLOOD: LIPASE: 21 U/L (ref 11–51)

## 2018-01-12 MED ORDER — ONDANSETRON 4 MG PO TBDP
4.0000 mg | ORAL_TABLET | Freq: Once | ORAL | Status: AC
  Administered 2018-01-12: 4 mg via ORAL
  Filled 2018-01-12: qty 1

## 2018-01-12 MED ORDER — ONDANSETRON 4 MG PO TBDP: 4.0000 mg | ORAL_TABLET | Freq: Three times a day (TID) | ORAL | 0 refills | Status: AC | PRN

## 2018-01-12 NOTE — ED Notes (Signed)
Pt stable, states understanding of discharge instructions, [facility] called for transport

## 2018-01-12 NOTE — ED Notes (Signed)
Pt states that ems was incorrect, she states that she has not missed any dialysis treatments this week.

## 2018-01-12 NOTE — ED Provider Notes (Signed)
[facility] EMERGENCY DEPARTMENT Provider Note   CSN: [id number] Arrival date & time: 01/12/18  1654     History   Chief Complaint Chief Complaint  Patient presents with  . Nausea    HPI Deborah Hendricks is a 72 y.o. female.  72 year old female.  Resides in an extended care facility.  History of end-stage renal disease on 3 times weekly maintenance dialysis.  Was dialyzed yesterday.  Did not go to dialysis Tuesday Thursday this week because she was feeling poorly.  She had some additional emesis today and was sent in for evaluation for nausea.  No abdominal pain.  No fever.  States she feels "better" on arrival.  HPI  Past Medical History:  Diagnosis Date  . Anemia   . Anxiety   . CHF (congestive heart failure) ([place])   . Constipation   . Critical lower limb ischemia   . Depression   . ESRD on dialysis [facility])    "TTS; [place]" (11/27/2016)  . GERD (gastroesophageal reflux disease)   . Heart murmur   . HLD (hyperlipidemia)   . Hypertension   . Memory loss   . Peripheral vascular disease ([place])   . Refusal of blood transfusions as patient is Jehovah's Witness   . Seizures ([place]) 04/2015   s/p CVA.  [name] Sleep apnea    Noncompliant with CPAP.  [name] Stroke ([place]) 2015  . Type II diabetes mellitus [facility])     Patient Active Problem List   Diagnosis Date Noted  . Nonrheumatic aortic valve stenosis   . Sinus pause   . Syncope and collapse 12/14/2017  . Syncope   . AV block, 1st degree   . Hypertension   . Hypertensive urgency 11/09/2017  . Ischemia of right lower extremity 06/14/2017  . Hyperkalemia 06/05/2017  . Bleeding due to dialysis catheter placement ([place]) 06/05/2017  . History of hysterectomy 03/09/2017  . Former smoker 03/09/2017  . Dysphagia 03/09/2017  . Carotid stenosis 03/09/2017  . Need for assistance due to reduced mobility 03/09/2017  . Complication of vascular access for dialysis 03/09/2017  . Anemia in chronic kidney disease (CKD)  03/09/2017  . Sleep apnea   . Refusal of blood transfusions as patient is Jehovah's Witness   . Constipation   . GERD (gastroesophageal reflux disease)   . HLD (hyperlipidemia)   . TIA (transient ischemic attack) 02/01/2017  . Diabetic ulcer of toe of right foot associated with diabetes mellitus due to underlying condition, limited to breakdown of skin ([place])   . PVD (peripheral vascular disease) ([place]) 11/27/2016  . Blurry vision   . Asymmetric SNHL (sensorineural hearing loss) 06/12/2016  . Peripheral neuropathy 06/11/2016  . History of pulmonary embolism 06/08/2016  . History of CVA with residual deficit 06/08/2016  . History of borderline personality disorder 04/19/2016  . History of medication noncompliance 04/19/2016  . Paroxysmal atrial fibrillation ([place]) 04/19/2016  . Seizure disorder ([place]) 04/19/2016  . Renovascular hypertension 04/13/2016  . ESRD on dialysis ([place]) 04/11/2016  . Generalized anxiety disorder 04/11/2016  . Major depression, recurrent, chronic ([place]) 04/11/2016  . S/P AKA (above knee amputation) unilateral, left ([place]) 04/11/2016  . PAD (peripheral artery disease) ([place]) 12/15/2015  . Type II diabetes mellitus with neurological manifestations ([place]) 10/21/2015  . Seizures ([place]) 04/13/2015    Past Surgical History:  Procedure Laterality Date  . ABDOMINAL AORTOGRAM W/LOWER EXTREMITY Right 06/13/2017   Procedure: Abdominal Aortogram w/Lower Extremity;  Surgeon: Waynetta Sandy, MD;  Location: [place]  CV LAB;  Service: Cardiovascular;  Laterality: Right;  . ABDOMINAL HYSTERECTOMY    . AMPUTATION Left 12/15/2015   Procedure: LEFT ABOVE KNEE AMPUTATION;  Surgeon: Serafina Mitchell, MD;  Location: [place];  Service: Vascular;  Laterality: Left;  . AMPUTATION Right 06/19/2017   Procedure: AMPUTATION right great toe;  Surgeon: Serafina Mitchell, MD;  Location: [place];  Service: Vascular;  Laterality: Right;  . BASCILIC VEIN TRANSPOSITION Right 11/25/2015   Procedure:  Right Arm FIRST STAGE BASILIC VEIN TRANSPOSITION;  Surgeon: Serafina Mitchell, MD;  Location: [place];  Service: Vascular;  Laterality: Right;  . BASCILIC VEIN TRANSPOSITION Right 01/19/2016   Procedure: RIGHT SECOND STAGE BASILIC VEIN TRANSPOSITION;  Surgeon: Serafina Mitchell, MD;  Location: [place];  Service: Vascular;  Laterality: Right;  . DILATION AND CURETTAGE OF UTERUS    . EYE SURGERY    . IR FLUORO GUIDE CV LINE RIGHT  06/05/2017  . PERIPHERAL VASCULAR BALLOON ANGIOPLASTY Right 06/13/2017   Procedure: PERIPHERAL VASCULAR BALLOON ANGIOPLASTY;  Surgeon: Waynetta Sandy, MD;  Location: [place] CV LAB;  Service: Cardiovascular;  Laterality: Right;  POPLITEAL/SFA/PERONEAL  . PERIPHERAL VASCULAR CATHETERIZATION Bilateral 11/28/2015   Procedure: Lower Extremity Angiography;  Surgeon: Lorretta Harp, MD;  Location: [place] CV LAB;  Service: Cardiovascular;  Laterality: Bilateral;  . PERIPHERAL VASCULAR CATHETERIZATION N/A 11/28/2015   Procedure: Abdominal Aortogram;  Surgeon: Lorretta Harp, MD;  Location: [place] CV LAB;  Service: Cardiovascular;  Laterality: N/A;  . PERIPHERAL VASCULAR CATHETERIZATION Right 11/27/2016  . PERIPHERAL VASCULAR CATHETERIZATION N/A 11/27/2016   Procedure: Abdominal Aortogram w/Lower Extremity;  Surgeon: Serafina Mitchell, MD;  Location: [place] CV LAB;  Service: Cardiovascular;  Laterality: N/A;  . PERIPHERAL VASCULAR CATHETERIZATION N/A 11/27/2016   Procedure: Right arm and central venogram;  Surgeon: Serafina Mitchell, MD;  Location: [place] CV LAB;  Service: Cardiovascular;  Laterality: N/A;  . PERIPHERAL VASCULAR CATHETERIZATION Right 11/27/2016   Procedure: Peripheral Vascular Atherectomy;  Surgeon: Serafina Mitchell, MD;  Location: [place] CV LAB;  Service: Cardiovascular;  Laterality: Right;  Rt    sfa, popliteal, tibioperoneal trunk, peroneal  . PERIPHERAL VASCULAR CATHETERIZATION Right 11/27/2016   Procedure: Peripheral Vascular Balloon  Angioplasty;  Surgeon: Serafina Mitchell, MD;  Location: [place] CV LAB;  Service: Cardiovascular;  Laterality: Right;  Tibial peroneal  . RETINOPATHY SURGERY Bilateral   . TOE AMPUTATION Right    2nd, 3rd, 4th, 5th digits  . TONSILLECTOMY      OB History    No data available       Home Medications    Prior to Admission medications   Medication Sig Start Date End Date Taking? Authorizing Provider  Amino Acids-Protein Hydrolys (FEEDING SUPPLEMENT, PRO-STAT SUGAR FREE 64,) LIQD Take 30 mLs by mouth 2 (two) times daily. (0900 & 2100) 06/14/17  Yes Virgina Jock A, PA-C  amLODipine (NORVASC) 10 MG tablet Take 1 tablet (10 mg total) by mouth daily. 11/16/17  Yes Danford, Suann Larry, MD  apixaban (ELIQUIS) 2.5 MG TABS tablet Take 2.5 mg by mouth 2 (two) times daily.   Yes [provider]  aspirin EC 81 MG tablet Take 81 mg by mouth daily.   Yes [provider]  atorvastatin (LIPITOR) 40 MG tablet Take 1 tablet (40 mg total) by mouth daily. 03/07/17  Yes Briscoe Deutscher, DO  calcium acetate (PHOSLO) 667 MG capsule Take 2,001 mg by mouth 3 (three) times daily with meals.  Yes [provider]  citalopram (CELEXA) 10 MG tablet Take 10 mg by mouth daily. (0900) 09/27/16  Yes [provider]  esomeprazole (NEXIUM) 20 MG capsule Take 1 capsule (20 mg total) by mouth daily. 03/11/17  Yes Briscoe Deutscher, DO  gabapentin (NEURONTIN) 100 MG capsule Take 1 capsule (100 mg total) by mouth at bedtime. 06/20/17  Yes Virgina Jock A, PA-C  hydrALAZINE (APRESOLINE) 10 MG tablet Take 1 tablet (10 mg total) by mouth every 8 (eight) hours. 11/15/17  Yes Danford, Suann Larry, MD  insulin aspart (NOVOLOG) 100 UNIT/ML injection Inject 2-10 Units into the skin 3 (three) times daily as needed for high blood sugar. 151-200=2 units, 201-250=4 units, 251-300=6 units, 301-350=8 units, 351-400=10 units, >400=10 units and Call MD   Yes [provider]  levETIRAcetam (KEPPRA) 500  MG tablet Take 1 tablet (500 mg total) by mouth 2 (two) times daily. 11/15/17  Yes Danford, Suann Larry, MD  Multiple Vitamin (MULTIVITAMIN) tablet Take 1 tablet by mouth daily.   Yes [provider]  Nutritional Supplements (FEEDING SUPPLEMENT, NEPRO CARB STEADY,) LIQD Take 237 mLs by mouth daily with lunch. 06/20/17  Yes Virgina Jock A, PA-C  polyethylene glycol powder (GLYCOLAX/MIRALAX) powder Take 17 g by mouth at bedtime. HOLD FOR LOOSE STOOLS    Yes [provider]  psyllium (REGULOID) 0.52 g capsule One po tid prn constipation. Patient taking differently: Take 0.52 g by mouth 3 (three) times daily as needed (for constipation.).  03/22/17  Yes Briscoe Deutscher, DO  ondansetron (ZOFRAN ODT) 4 MG disintegrating tablet Take 1 tablet (4 mg total) by mouth every 8 (eight) hours as needed for nausea. 01/12/18   Tanna Furry, MD    Family History Family History  Problem Relation Age of Onset  . Asthma Mother   . Cancer Sister     Social History Social History   Tobacco Use  . Smoking status: Former Smoker    Packs/day: 0.25    Years: 51.00    Pack years: 12.75    Types: Cigarettes    Last attempt to quit: 04/13/2015    Years since quitting: 2.7  . Smokeless tobacco: Never Used  Substance Use Topics  . Alcohol use: Yes    Alcohol/week: 0.0 oz    Comment: 11/27/2016 "stopped in 04/2015"  . Drug use: No     Allergies   Penicillins; Sulfa antibiotics; Amoxicillin; and Latex   Review of Systems Review of Systems  Constitutional: Negative for appetite change, chills, diaphoresis, fatigue and fever.  HENT: Negative for mouth sores, sore throat and trouble swallowing.   Eyes: Negative for visual disturbance.  Respiratory: Negative for cough, chest tightness, shortness of breath and wheezing.   Cardiovascular: Negative for chest pain.  Gastrointestinal: Positive for nausea. Negative for abdominal distention, abdominal pain, diarrhea and vomiting.  Endocrine: Negative  for polydipsia, polyphagia and polyuria.  Genitourinary: Negative for dysuria, frequency and hematuria.  Musculoskeletal: Negative for gait problem.  Skin: Negative for color change, pallor and rash.  Neurological: Negative for dizziness, syncope, light-headedness and headaches.  Hematological: Does not bruise/bleed easily.  Psychiatric/Behavioral: Negative for behavioral problems and confusion.     Physical Exam Updated Vital Signs BP 125/66   Pulse 80   Temp 99.6 F (37.6 C) (Oral)   Resp 18   SpO2 94%   Physical Exam  Constitutional: She appears well-developed and well-nourished. No distress.  HENT:  Head: Normocephalic.  Eyes: Conjunctivae are normal. Pupils are equal, round, and reactive to  light. No scleral icterus.  Neck: Normal range of motion. Neck supple. No thyromegaly present.  Cardiovascular: Normal rate and regular rhythm. Exam reveals no gallop and no friction rub.  No murmur heard. Pulmonary/Chest: Effort normal and breath sounds normal. No respiratory distress. She has no wheezes. She has no rales.  Abdominal: Soft. Bowel sounds are normal. She exhibits no distension. There is no tenderness. There is no rebound.  Musculoskeletal: Normal range of motion.  Left BKA.  Dialysis access right leg.  Neurological: She is alert.  Skin: Skin is warm and dry. No rash noted.  Psychiatric: She has a normal mood and affect. Her behavior is normal.     ED Treatments / Results  Labs (all labs ordered are listed, but only abnormal results are displayed) Labs Reviewed  COMPREHENSIVE METABOLIC PANEL - Abnormal; Notable for the following components:      Result Value   Sodium 134 (*)    Chloride 97 (*)    Glucose, Bld 196 (*)    BUN 38 (*)    Creatinine, Ser 5.34 (*)    Albumin 3.1 (*)    Alkaline Phosphatase 140 (*)    GFR calc non Af Amer 7 (*)    GFR calc Af Amer 8 (*)    All other components within normal limits  CBC - Abnormal; Notable for the following  components:   RBC 3.01 (*)    Hemoglobin 8.4 (*)    HCT 26.7 (*)    RDW 17.5 (*)    All other components within normal limits  LIPASE, BLOOD  URINALYSIS, ROUTINE W REFLEX MICROSCOPIC    EKG  EKG Interpretation None       Radiology No results found.  Procedures Procedures (including critical care time)  Medications Ordered in ED Medications  ondansetron (ZOFRAN-ODT) disintegrating tablet 4 mg (4 mg Oral Given 01/12/18 2038)     Initial Impression / Assessment and Plan / ED Course  I have reviewed the triage vital signs and the nursing notes.  Pertinent labs & imaging results that were available during my care of the patient were reviewed by me and considered in my medical decision making (see chart for details).    Has worsening anemia.  Guaiac negative.  Not tachycardic.  Not clinically in CHF.  Given ODT Zofran and taking p.o. without difficulty.  Plan refer back to her primary care facility and follow-up hemoglobin.  Final Clinical Impressions(s) / ED Diagnoses   Final diagnoses:  Nausea    ED Discharge Orders        Ordered    ondansetron (ZOFRAN ODT) 4 MG disintegrating tablet  Every 8 hours PRN     01/12/18 2202       Tanna Furry, MD 01/13/18 1124

## 2018-01-12 NOTE — Discharge Instructions (Signed)
Your Hemoglobin/blood count is lower than previous. Please have repeat testing with your primary physician or Nephrology doctor to ensure that this is stable or improving.  Recheck with any bleeding--blood in stool, or emesis.

## 2018-01-12 NOTE — ED Triage Notes (Signed)
Pt arrived by [facility] from [facility] for nausea since yesterday. Was given zofran at [facility] with no relief. Last dialysis was Saturday but missed her treatments on tues/thur.

## 2018-01-12 NOTE — ED Notes (Signed)
CALLED [facility] FOR TRANSPORT TO [facility]--Deborah Hendricks  

## 2018-01-12 NOTE — ED Notes (Signed)
Pt aware of need for urine sample. As pt is dialysis pt, does not make a lot of urine, but says sometimes she will urinate. PureWick in place at this time.

## 2018-01-13 DIAGNOSIS — R293 Abnormal posture: Secondary | ICD-10-CM | POA: Diagnosis not present

## 2018-01-13 DIAGNOSIS — S88919S Complete traumatic amputation of unspecified lower leg, level unspecified, sequela: Secondary | ICD-10-CM | POA: Diagnosis not present

## 2018-01-15 DIAGNOSIS — E119 Type 2 diabetes mellitus without complications: Secondary | ICD-10-CM | POA: Diagnosis not present

## 2018-01-15 DIAGNOSIS — Z9119 Patient's noncompliance with other medical treatment and regimen: Secondary | ICD-10-CM | POA: Diagnosis not present

## 2018-01-15 DIAGNOSIS — N186 End stage renal disease: Secondary | ICD-10-CM | POA: Diagnosis not present

## 2018-01-23 DIAGNOSIS — F419 Anxiety disorder, unspecified: Secondary | ICD-10-CM | POA: Diagnosis not present

## 2018-01-23 DIAGNOSIS — F39 Unspecified mood [affective] disorder: Secondary | ICD-10-CM | POA: Diagnosis not present

## 2018-01-23 DIAGNOSIS — F339 Major depressive disorder, recurrent, unspecified: Secondary | ICD-10-CM | POA: Diagnosis not present

## 2018-01-28 DIAGNOSIS — R569 Unspecified convulsions: Secondary | ICD-10-CM | POA: Diagnosis not present

## 2018-01-28 DIAGNOSIS — I739 Peripheral vascular disease, unspecified: Secondary | ICD-10-CM | POA: Diagnosis not present

## 2018-01-28 DIAGNOSIS — J449 Chronic obstructive pulmonary disease, unspecified: Secondary | ICD-10-CM | POA: Diagnosis not present

## 2018-01-28 DIAGNOSIS — E785 Hyperlipidemia, unspecified: Secondary | ICD-10-CM | POA: Diagnosis not present

## 2018-01-28 DIAGNOSIS — N186 End stage renal disease: Secondary | ICD-10-CM | POA: Diagnosis not present

## 2018-01-28 DIAGNOSIS — K219 Gastro-esophageal reflux disease without esophagitis: Secondary | ICD-10-CM | POA: Diagnosis not present

## 2018-01-28 DIAGNOSIS — I509 Heart failure, unspecified: Secondary | ICD-10-CM | POA: Diagnosis not present

## 2018-01-28 DIAGNOSIS — I10 Essential (primary) hypertension: Secondary | ICD-10-CM | POA: Diagnosis not present

## 2018-01-28 DIAGNOSIS — E1159 Type 2 diabetes mellitus with other circulatory complications: Secondary | ICD-10-CM | POA: Diagnosis not present

## 2018-01-28 DIAGNOSIS — I679 Cerebrovascular disease, unspecified: Secondary | ICD-10-CM | POA: Diagnosis not present

## 2018-01-28 DIAGNOSIS — D631 Anemia in chronic kidney disease: Secondary | ICD-10-CM | POA: Diagnosis not present

## 2018-01-28 DIAGNOSIS — F339 Major depressive disorder, recurrent, unspecified: Secondary | ICD-10-CM | POA: Diagnosis not present

## 2018-01-28 DIAGNOSIS — Z9119 Patient's noncompliance with other medical treatment and regimen: Secondary | ICD-10-CM | POA: Diagnosis not present

## 2018-02-01 ENCOUNTER — Encounter (HOSPITAL_COMMUNITY): Payer: Self-pay | Admitting: Emergency Medicine

## 2018-02-01 ENCOUNTER — Emergency Department (HOSPITAL_COMMUNITY)
Admission: EM | Admit: 2018-02-01 | Discharge: 2018-02-01 | Disposition: A | Discharge status: Home / Self Care | Attending: Emergency Medicine | Admitting: Emergency Medicine

## 2018-02-01 DIAGNOSIS — Z8673 Personal history of transient ischemic attack (TIA), and cerebral infarction without residual deficits: Secondary | ICD-10-CM | POA: Insufficient documentation

## 2018-02-01 DIAGNOSIS — Z794 Long term (current) use of insulin: Secondary | ICD-10-CM | POA: Diagnosis not present

## 2018-02-01 DIAGNOSIS — Z87891 Personal history of nicotine dependence: Secondary | ICD-10-CM | POA: Diagnosis not present

## 2018-02-01 DIAGNOSIS — E1122 Type 2 diabetes mellitus with diabetic chronic kidney disease: Secondary | ICD-10-CM | POA: Insufficient documentation

## 2018-02-01 DIAGNOSIS — R58 Hemorrhage, not elsewhere classified: Secondary | ICD-10-CM | POA: Diagnosis not present

## 2018-02-01 DIAGNOSIS — K5641 Fecal impaction: Secondary | ICD-10-CM | POA: Insufficient documentation

## 2018-02-01 DIAGNOSIS — Z515 Encounter for palliative care: Secondary | ICD-10-CM | POA: Diagnosis not present

## 2018-02-01 DIAGNOSIS — N186 End stage renal disease: Secondary | ICD-10-CM | POA: Insufficient documentation

## 2018-02-01 DIAGNOSIS — K5900 Constipation, unspecified: Secondary | ICD-10-CM | POA: Diagnosis not present

## 2018-02-01 DIAGNOSIS — Z7982 Long term (current) use of aspirin: Secondary | ICD-10-CM | POA: Diagnosis not present

## 2018-02-01 DIAGNOSIS — Z7901 Long term (current) use of anticoagulants: Secondary | ICD-10-CM | POA: Diagnosis not present

## 2018-02-01 DIAGNOSIS — Z91040 Latex allergy status: Secondary | ICD-10-CM | POA: Diagnosis not present

## 2018-02-01 DIAGNOSIS — I509 Heart failure, unspecified: Secondary | ICD-10-CM | POA: Insufficient documentation

## 2018-02-01 DIAGNOSIS — Z79899 Other long term (current) drug therapy: Secondary | ICD-10-CM | POA: Diagnosis not present

## 2018-02-01 DIAGNOSIS — I132 Hypertensive heart and chronic kidney disease with heart failure and with stage 5 chronic kidney disease, or end stage renal disease: Secondary | ICD-10-CM | POA: Diagnosis not present

## 2018-02-01 DIAGNOSIS — K6289 Other specified diseases of anus and rectum: Secondary | ICD-10-CM | POA: Diagnosis not present

## 2018-02-01 DIAGNOSIS — R100 Acute abdomen: Secondary | ICD-10-CM | POA: Diagnosis not present

## 2018-02-01 LAB — CBC WITH DIFFERENTIAL/PLATELET
Basophils Absolute: 0 10*3/uL (ref 0.0–0.1)
Basophils Relative: 1 %
EOS ABS: 0.1 10*3/uL (ref 0.0–0.7)
EOS PCT: 2 %
HCT: 29.2 % — ABNORMAL LOW (ref 36.0–46.0)
HEMOGLOBIN: 9.4 g/dL — AB (ref 12.0–15.0)
LYMPHS ABS: 0.9 10*3/uL (ref 0.7–4.0)
Lymphocytes Relative: 17 %
MCH: 28.6 pg (ref 26.0–34.0)
MCHC: 32.2 g/dL (ref 30.0–36.0)
MCV: 88.8 fL (ref 78.0–100.0)
MONOS PCT: 6 %
Monocytes Absolute: 0.3 10*3/uL (ref 0.1–1.0)
Neutro Abs: 4.3 10*3/uL (ref 1.7–7.7)
Neutrophils Relative %: 76 %
Platelets: 307 10*3/uL (ref 150–400)
RBC: 3.29 MIL/uL — ABNORMAL LOW (ref 3.87–5.11)
RDW: 20.1 % — ABNORMAL HIGH (ref 11.5–15.5)
WBC: 5.7 10*3/uL (ref 4.0–10.5)

## 2018-02-01 MED ORDER — ONDANSETRON 4 MG PO TBDP
4.0000 mg | ORAL_TABLET | Freq: Once | ORAL | Status: AC
  Administered 2018-02-01: 4 mg via ORAL
  Filled 2018-02-01: qty 1

## 2018-02-01 NOTE — ED Notes (Signed)
Per pt and [facility] nurse @ [facility] reports that pts daughter will be picking pt up to transport back to home.

## 2018-02-01 NOTE — ED Notes (Signed)
Lab called CMP hemolyzed.  Needs re-drawn.

## 2018-02-01 NOTE — ED Notes (Signed)
[facility] called for transport.  

## 2018-02-01 NOTE — ED Notes (Signed)
[facility] at bedside. Report given.

## 2018-02-01 NOTE — Discharge Instructions (Addendum)
Please follow with your primary care doctor in the next 2 days for a check-up. They must obtain records for further management.  ° °Do not hesitate to return to the Emergency Department for any new, worsening or concerning symptoms.  ° °

## 2018-02-01 NOTE — ED Triage Notes (Signed)
Pt arrives via EMS from [facility] with complaints of rectal pain. Pt denies any diarrhea or pressure ulcers. Pt reports missing last 6 HD treatments and facility reports she has been refusing medications.

## 2018-02-01 NOTE — ED Notes (Signed)
Pt nauseated, vomiting saliva.  PA at bedside.

## 2018-02-01 NOTE — ED Provider Notes (Signed)
[facility] EMERGENCY DEPARTMENT Provider Note   CSN: [id number] Arrival date & time: 02/01/18  1048     History   Chief Complaint Chief Complaint  Patient presents with  . Rectal Pain    HPI   Blood pressure 128/77, pulse 79, temperature (!) 97.4 F (36.3 C), temperature source Oral, resp. rate 18, SpO2 96 %.  Deborah Hendricks is a 72 y.o. female brought in by EMS from [facility] with past medical history significant for CHF, ESRD (she has not gone to dialysis 6 times) she is on hospice she is complaining of rectal pain worsening over the last several days.  She does not have any diarrhea, she states that she is constipated.  No fever, chills, nausea or vomiting.  She denies any abdominal pain.  Past Medical History:  Diagnosis Date  . Anemia   . Anxiety   . CHF (congestive heart failure) ([place])   . Constipation   . Critical lower limb ischemia   . Depression   . ESRD on dialysis [facility])    "TTS; [place]" (11/27/2016)  . GERD (gastroesophageal reflux disease)   . Heart murmur   . HLD (hyperlipidemia)   . Hypertension   . Memory loss   . Peripheral vascular disease ([place])   . Refusal of blood transfusions as patient is Jehovah's Witness   . Seizures ([place]) 04/2015   s/p CVA.  [name] Sleep apnea    Noncompliant with CPAP.  [name] Stroke ([place]) 2015  . Type II diabetes mellitus [facility])     Patient Active Problem List   Diagnosis Date Noted  . Nonrheumatic aortic valve stenosis   . Sinus pause   . Syncope and collapse 12/14/2017  . Syncope   . AV block, 1st degree   . Hypertension   . Hypertensive urgency 11/09/2017  . Ischemia of right lower extremity 06/14/2017  . Hyperkalemia 06/05/2017  . Bleeding due to dialysis catheter placement ([place]) 06/05/2017  . History of hysterectomy 03/09/2017  . Former smoker 03/09/2017  . Dysphagia 03/09/2017  . Carotid stenosis 03/09/2017  . Need for assistance due to reduced mobility 03/09/2017  . Complication of  vascular access for dialysis 03/09/2017  . Anemia in chronic kidney disease (CKD) 03/09/2017  . Sleep apnea   . Refusal of blood transfusions as patient is Jehovah's Witness   . Constipation   . GERD (gastroesophageal reflux disease)   . HLD (hyperlipidemia)   . TIA (transient ischemic attack) 02/01/2017  . Diabetic ulcer of toe of right foot associated with diabetes mellitus due to underlying condition, limited to breakdown of skin ([place])   . PVD (peripheral vascular disease) ([place]) 11/27/2016  . Blurry vision   . Asymmetric SNHL (sensorineural hearing loss) 06/12/2016  . Peripheral neuropathy 06/11/2016  . History of pulmonary embolism 06/08/2016  . History of CVA with residual deficit 06/08/2016  . History of borderline personality disorder 04/19/2016  . History of medication noncompliance 04/19/2016  . Paroxysmal atrial fibrillation ([place]) 04/19/2016  . Seizure disorder ([place]) 04/19/2016  . Renovascular hypertension 04/13/2016  . ESRD on dialysis ([place]) 04/11/2016  . Generalized anxiety disorder 04/11/2016  . Major depression, recurrent, chronic ([place]) 04/11/2016  . S/P AKA (above knee amputation) unilateral, left ([place]) 04/11/2016  . PAD (peripheral artery disease) ([place]) 12/15/2015  . Type II diabetes mellitus with neurological manifestations ([place]) 10/21/2015  . Seizures ([place]) 04/13/2015    Past Surgical History:  Procedure Laterality Date  . ABDOMINAL AORTOGRAM W/LOWER EXTREMITY Right 06/13/2017  Procedure: Abdominal Aortogram w/Lower Extremity;  Surgeon: Waynetta Sandy, MD;  Location: [facility] CV LAB;  Service: Cardiovascular;  Laterality: Right;  . ABDOMINAL HYSTERECTOMY    . AMPUTATION Left 12/15/2015   Procedure: LEFT ABOVE KNEE AMPUTATION;  Surgeon: Serafina Mitchell, MD;  Location: [facility];  Service: Vascular;  Laterality: Left;  . AMPUTATION Right 06/19/2017   Procedure: AMPUTATION right great toe;  Surgeon: Serafina Mitchell, MD;  Location: [facility];  Service: Vascular;   Laterality: Right;  . BASCILIC VEIN TRANSPOSITION Right 11/25/2015   Procedure: Right Arm FIRST STAGE BASILIC VEIN TRANSPOSITION;  Surgeon: Serafina Mitchell, MD;  Location: [facility];  Service: Vascular;  Laterality: Right;  . BASCILIC VEIN TRANSPOSITION Right 01/19/2016   Procedure: RIGHT SECOND STAGE BASILIC VEIN TRANSPOSITION;  Surgeon: Serafina Mitchell, MD;  Location: [facility];  Service: Vascular;  Laterality: Right;  . DILATION AND CURETTAGE OF UTERUS    . EYE SURGERY    . IR FLUORO GUIDE CV LINE RIGHT  06/05/2017  . PERIPHERAL VASCULAR BALLOON ANGIOPLASTY Right 06/13/2017   Procedure: PERIPHERAL VASCULAR BALLOON ANGIOPLASTY;  Surgeon: Waynetta Sandy, MD;  Location: [facility] CV LAB;  Service: Cardiovascular;  Laterality: Right;  POPLITEAL/SFA/PERONEAL  . PERIPHERAL VASCULAR CATHETERIZATION Bilateral 11/28/2015   Procedure: Lower Extremity Angiography;  Surgeon: Lorretta Harp, MD;  Location: [facility] CV LAB;  Service: Cardiovascular;  Laterality: Bilateral;  . PERIPHERAL VASCULAR CATHETERIZATION N/A 11/28/2015   Procedure: Abdominal Aortogram;  Surgeon: Lorretta Harp, MD;  Location: [place] [facility] CV LAB;  Service: Cardiovascular;  Laterality: N/A;  . PERIPHERAL VASCULAR CATHETERIZATION Right 11/27/2016  . PERIPHERAL VASCULAR CATHETERIZATION N/A 11/27/2016   Procedure: Abdominal Aortogram w/Lower Extremity;  Surgeon: Serafina Mitchell, MD;  Location: [facility] CV LAB;  Service: Cardiovascular;  Laterality: N/A;  . PERIPHERAL VASCULAR CATHETERIZATION N/A 11/27/2016   Procedure: Right arm and central venogram;  Surgeon: Serafina Mitchell, MD;  Location: [place] CV LAB;  Service: Cardiovascular;  Laterality: N/A;  . PERIPHERAL VASCULAR CATHETERIZATION Right 11/27/2016   Procedure: Peripheral Vascular Atherectomy;  Surgeon: Serafina Mitchell, MD;  Location: [place] CV LAB;  Service: Cardiovascular;  Laterality: Right;  Rt    sfa, popliteal, tibioperoneal trunk, peroneal  . PERIPHERAL  VASCULAR CATHETERIZATION Right 11/27/2016   Procedure: Peripheral Vascular Balloon Angioplasty;  Surgeon: Serafina Mitchell, MD;  Location: [facility] CV LAB;  Service: Cardiovascular;  Laterality: Right;  Tibial peroneal  . RETINOPATHY SURGERY Bilateral   . TOE AMPUTATION Right    2nd, 3rd, 4th, 5th digits  . TONSILLECTOMY       OB History   None      Home Medications    Prior to Admission medications   Medication Sig Start Date End Date Taking? Authorizing Provider  Amino Acids-Protein Hydrolys (FEEDING SUPPLEMENT, PRO-STAT SUGAR FREE 64,) LIQD Take 30 mLs by mouth 2 (two) times daily. (0900 & 2100) 06/14/17   Virgina Jock A, PA-C  amLODipine (NORVASC) 10 MG tablet Take 1 tablet (10 mg total) by mouth daily. 11/16/17   Danford, Suann Larry, MD  apixaban (ELIQUIS) 2.5 MG TABS tablet Take 2.5 mg by mouth 2 (two) times daily.    [provider]  aspirin EC 81 MG tablet Take 81 mg by mouth daily.    [provider]  atorvastatin (LIPITOR) 40 MG tablet Take 1 tablet (40 mg total) by mouth daily. 03/07/17   Briscoe Deutscher, DO  calcium acetate (PHOSLO) 667 MG capsule Take  2,001 mg by mouth 3 (three) times daily with meals.     [provider]  citalopram (CELEXA) 10 MG tablet Take 10 mg by mouth daily. (0900) 09/27/16   [provider]  esomeprazole (NEXIUM) 20 MG capsule Take 1 capsule (20 mg total) by mouth daily. 03/11/17   Briscoe Deutscher, DO  gabapentin (NEURONTIN) 100 MG capsule Take 1 capsule (100 mg total) by mouth at bedtime. 06/20/17   Alvia Grove, PA-C  hydrALAZINE (APRESOLINE) 10 MG tablet Take 1 tablet (10 mg total) by mouth every 8 (eight) hours. 11/15/17   Danford, Suann Larry, MD  insulin aspart (NOVOLOG) 100 UNIT/ML injection Inject 2-10 Units into the skin 3 (three) times daily as needed for high blood sugar. 151-200=2 units, 201-250=4 units, 251-300=6 units, 301-350=8 units, 351-400=10 units, >400=10 units and Call MD    [provider]  levETIRAcetam (KEPPRA) 500 MG tablet Take 1 tablet (500 mg total) by mouth 2 (two) times daily. 11/15/17   Danford, Suann Larry, MD  Multiple Vitamin (MULTIVITAMIN) tablet Take 1 tablet by mouth daily.    [provider]  Nutritional Supplements (FEEDING SUPPLEMENT, NEPRO CARB STEADY,) LIQD Take 237 mLs by mouth daily with lunch. 06/20/17   Alvia Grove, PA-C  ondansetron (ZOFRAN ODT) 4 MG disintegrating tablet Take 1 tablet (4 mg total) by mouth every 8 (eight) hours as needed for nausea. 01/12/18   Tanna Furry, MD  polyethylene glycol powder (GLYCOLAX/MIRALAX) powder Take 17 g by mouth at bedtime. HOLD FOR LOOSE STOOLS     [provider]  psyllium (REGULOID) 0.52 g capsule One po tid prn constipation. Patient taking differently: Take 0.52 g by mouth 3 (three) times daily as needed (for constipation.).  03/22/17   Briscoe Deutscher, DO    Family History Family History  Problem Relation Age of Onset  . Asthma Mother   . Cancer Sister     Social History Social History   Tobacco Use  . Smoking status: Former Smoker    Packs/day: 0.25    Years: 51.00    Pack years: 12.75    Types: Cigarettes    Last attempt to quit: 04/13/2015    Years since quitting: 2.8  . Smokeless tobacco: Never Used  Substance Use Topics  . Alcohol use: Yes    Alcohol/week: 0.0 oz    Comment: 11/27/2016 "stopped in 04/2015"  . Drug use: No     Allergies   Penicillins; Sulfa antibiotics; Amoxicillin; and Latex   Review of Systems Review of Systems  A complete review of systems was obtained and all systems are negative except as noted in the HPI and PMH.   Physical Exam Updated Vital Signs BP 132/80   Pulse 78   Temp (!) 97.4 F (36.3 C) (Oral)   Resp 16   SpO2 95%   Physical Exam  Constitutional: She is oriented to person, place, and time. She appears well-developed and well-nourished. No distress.  Appears acutely uncomfortable  HENT:  Head: Normocephalic and  atraumatic.  Mouth/Throat: Oropharynx is clear and moist.  Eyes: Pupils are equal, round, and reactive to light. Conjunctivae and EOM are normal.  Neck: Normal range of motion.  Cardiovascular: Normal rate, regular rhythm and intact distal pulses.  Pulmonary/Chest: Effort normal and breath sounds normal.  Good air movement, mild decrease at the bases bilaterally  Abdominal: Soft. There is no tenderness.  Musculoskeletal: Normal range of motion.  Neurological: She is alert and oriented to person, place, and time.  Skin: She is not diaphoretic.  Psychiatric: She has a normal mood and affect.  Nursing note and vitals reviewed.    ED Treatments / Results  Labs (all labs ordered are listed, but only abnormal results are displayed) Labs Reviewed  CBC WITH DIFFERENTIAL/PLATELET - Abnormal; Notable for the following components:      Result Value   RBC 3.29 (*)    Hemoglobin 9.4 (*)    HCT 29.2 (*)    RDW 20.1 (*)    All other components within normal limits    EKG None  Radiology No results found.  Procedures Fecal disimpaction Date/Time: 02/01/2018 12:54 PM Performed by: Monico Blitz, PA-C Authorized by: Monico Blitz, PA-C  Consent: Verbal consent obtained. Consent given by: patient Patient identity confirmed: verbally with patient Time out: Immediately prior to procedure a "time out" was called to verify the correct patient, procedure, equipment, support staff and site/side marked as required. Patient tolerance: Patient tolerated the procedure well with no immediate complications    (including critical care time)  Medications Ordered in ED Medications - No data to display   Initial Impression / Assessment and Plan / ED Course  I have reviewed the triage vital signs and the nursing notes.  Pertinent labs & imaging results that were available during my care of the patient were reviewed by me and considered in my medical decision making (see chart for  details).     Vitals:   02/01/18 1300 02/01/18 1330 02/01/18 1400 02/01/18 1430  BP:  121/74 136/62 132/80  Pulse: 76 77 76 78  Resp: 19 14 15 16   Temp:      TempSrc:      SpO2: 93% 95% 95% 95%    Deborah Hendricks is 72 y.o. female presenting with rectal pain, I have performed a disimpaction and she is much more comfortable.  I am concerned because this patient has not been to dialysis.  Will check basic blood work pending communication with hospice.  Discussed with hospice and palliative care nurse who is aware that she has stopped dialyzing, she is focusing on comfort care at this point, documented per Jen Mow.  Given that she is on comfort care and declining dialysis I will not keep her here for blood work.  Repeat abdominal exam remains benign and she states that she has significant improvement in her discomfort after disimpaction.  Evaluation does not show pathology that would require ongoing emergent intervention or inpatient treatment. Pt is hemodynamically stable and mentating appropriately. Discussed findings and plan with patient/guardian, who agrees with care plan. All questions answered. Return precautions discussed and outpatient follow up given.      Final Clinical Impressions(s) / ED Diagnoses   Final diagnoses:  Fecal impaction in rectum [facility])  Hospice care patient    ED Discharge Orders    None       Karen Kays Charna Elizabeth 02/01/18 1445    Quintella Reichert, MD 02/03/18 [phone]

## 2018-02-03 ENCOUNTER — Ambulatory Visit (HOSPITAL_COMMUNITY)
Admission: RE | Admit: 2018-02-03 | Payer: Medicare Other | Source: Ambulatory Visit | Attending: Cardiovascular Disease | Admitting: Cardiovascular Disease

## 2018-02-10 DEATH — deceased

## 2018-03-12 ENCOUNTER — Ambulatory Visit: Payer: Medicare Other | Admitting: *Deleted

## 2018-10-23 IMAGING — CR DG CHEST 1V PORT
1 series · 1 of 1 positions shown · non-contrast
Comparison: Chest radiographs 07/29/2016

CLINICAL DATA: Vomiting and abdominal pain.  Weakness.

EXAM:
PORTABLE CHEST 1 VIEW

[AP]
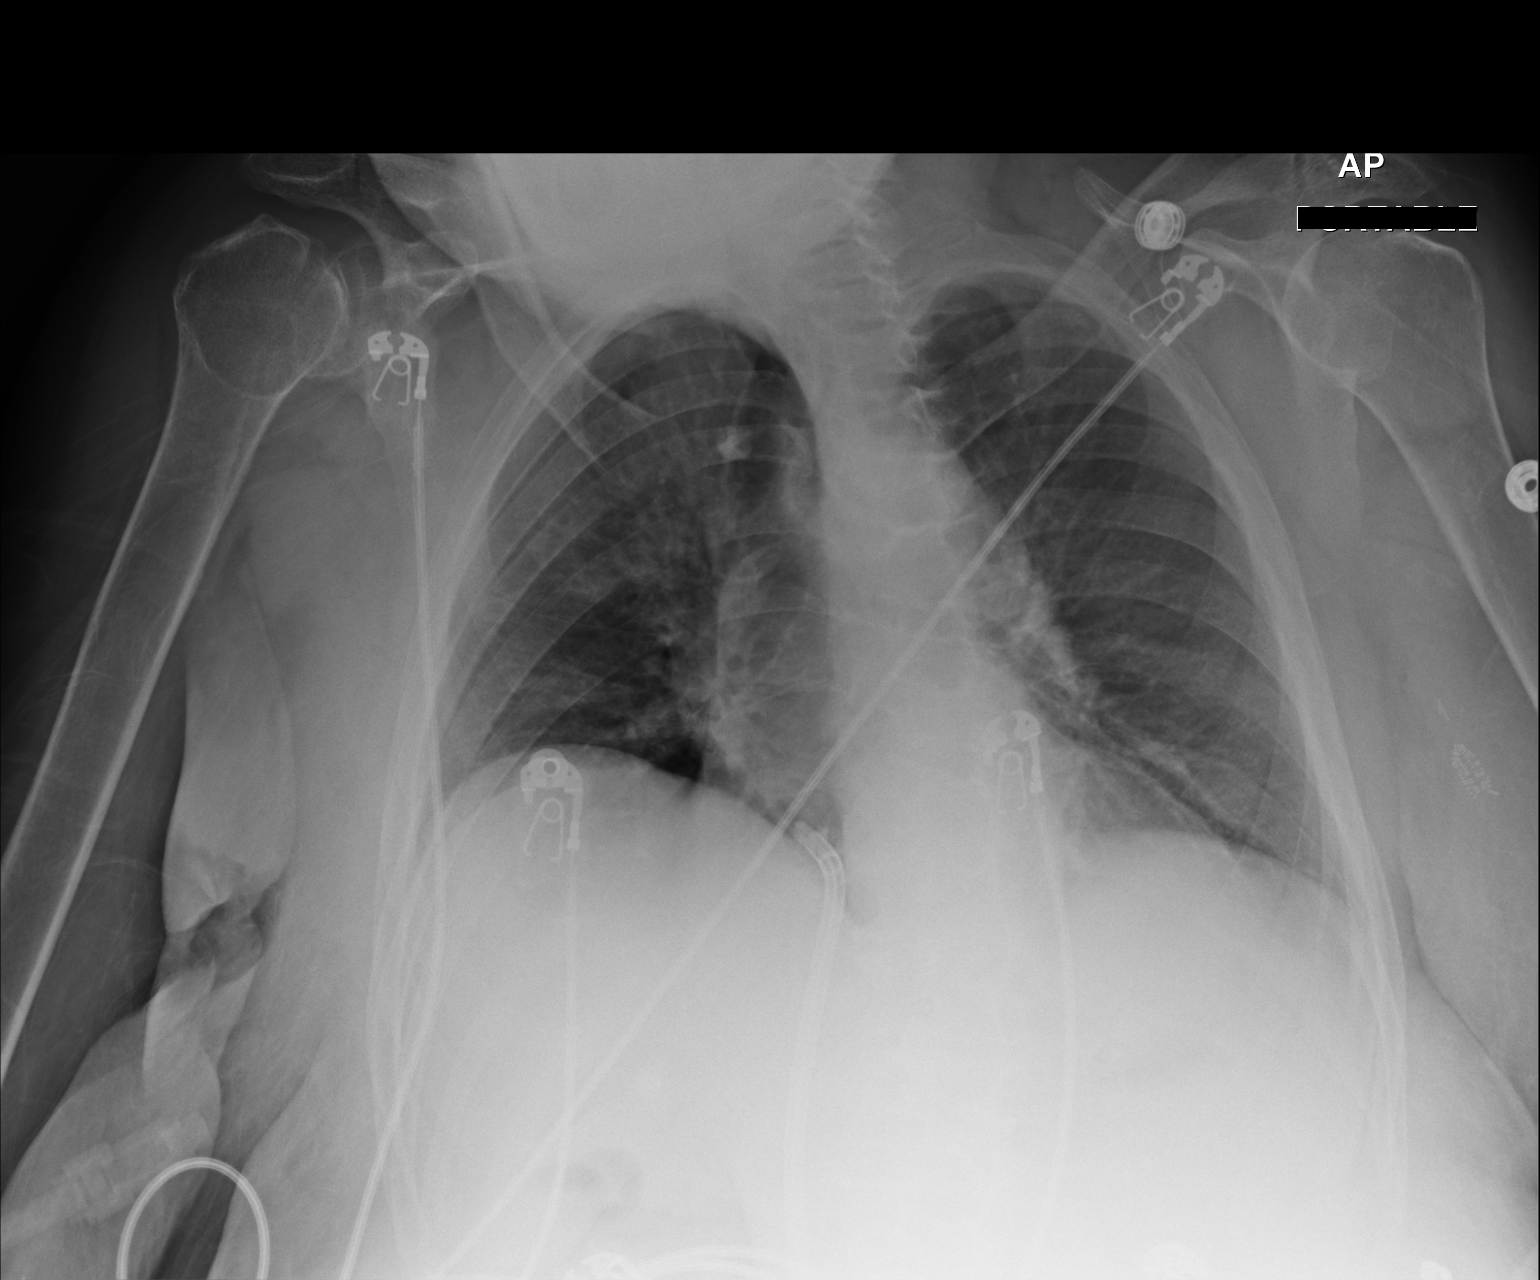

[1 of 1 positions shown; findings below may reference images not displayed]

FINDINGS: The patient is rotated. Low lung volumes with crowding of
bronchovascular structures. Heart size not well assessed due to
positioning. Catheter from an inferior approach with tip at the
atrial IVC junction. Left basilar atelectasis. No large pleural
effusion. No pneumothorax. Grossly stable osseous structures. No
evidence of free air in the upper abdomen.
IMPRESSION: Low lung volumes limiting assessment.  Left basilar atelectasis.

## 2019-01-02 IMAGING — XA IR FLUORO GUIDE CV LINE*R*
5 of 6 series · 14 of 24 positions shown · IV contrast (IODINE)
Comparison: None

CLINICAL DATA: End-stage renal disease with long-term indwelling
tunneled right femoral hemodialysis catheter. Recent poor function
of the catheter.

EXAM:
TUNNELED HEMODIALYSIS CATHETER EXCHANGE  FLUOROSCOPIC GUIDANCE
TECHNIQUE: The procedure, risks, benefits, and alternatives were explained to
the patient. Questions regarding the procedure were encouraged and
answered. The patient understands and consents to the procedure. As
antibiotic prophylaxis, vancomycin 1 g was ordered pre-procedure and
administered intravenously within one hour of incision.

[Series 1: fl (-) angio · 1 of 1 slices shown (1 of 3)]
[im 1/1]
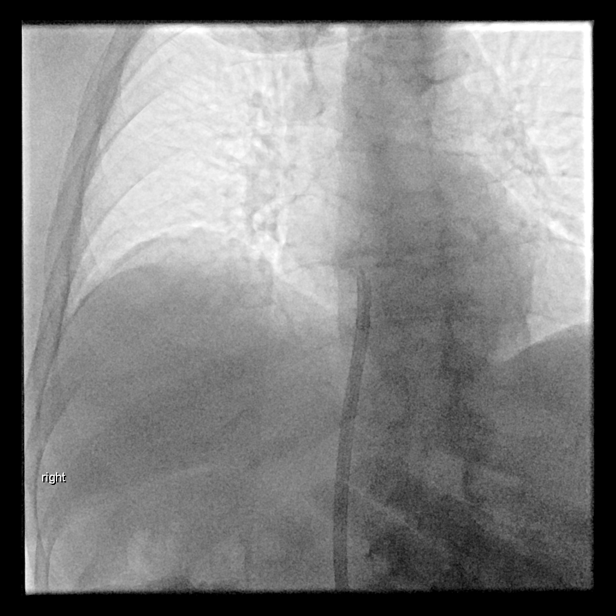

[Series 2: body 4 care · 6 acquisitions, 5 frames shown (1 of 2)]
[im 1/6]
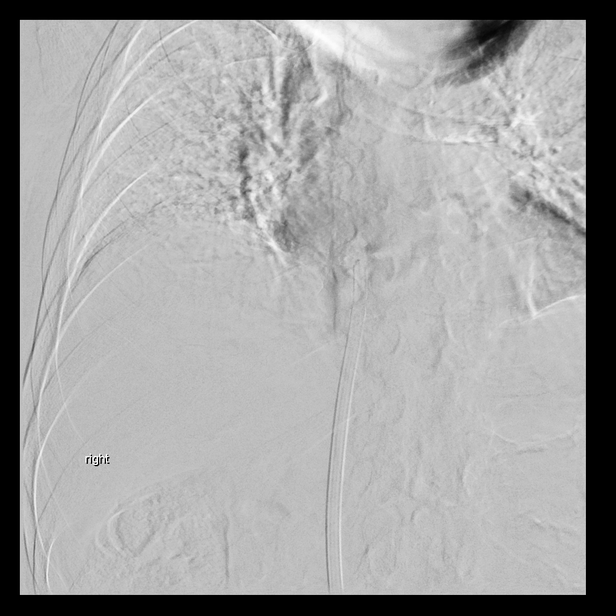
[im 1/6]
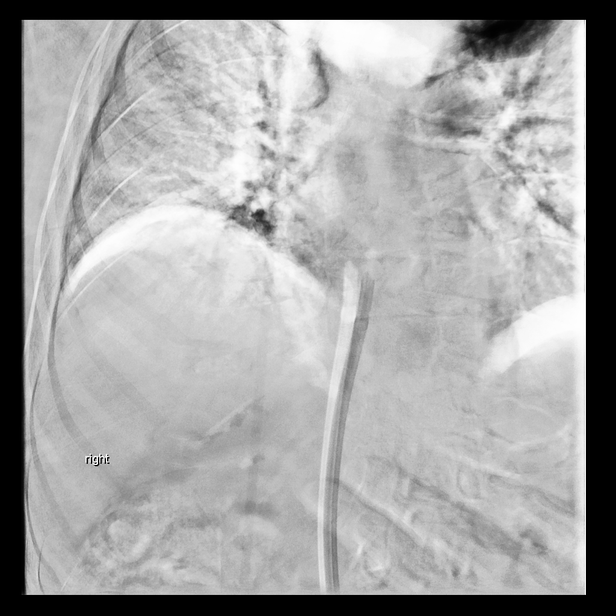
[im 3/6]
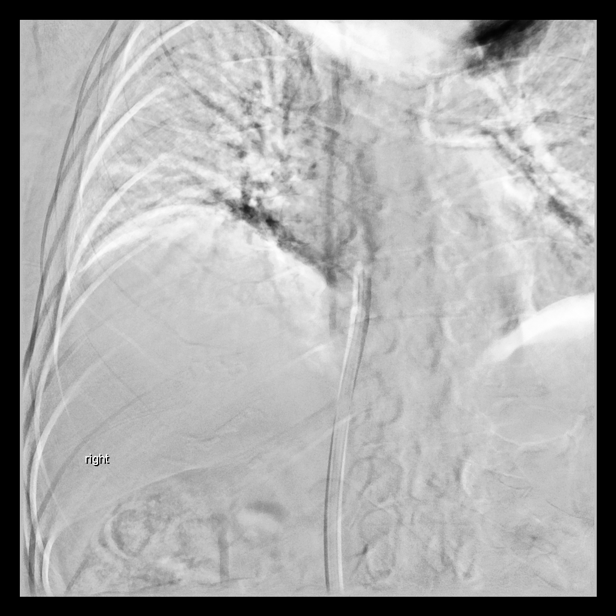
[im 4/6]
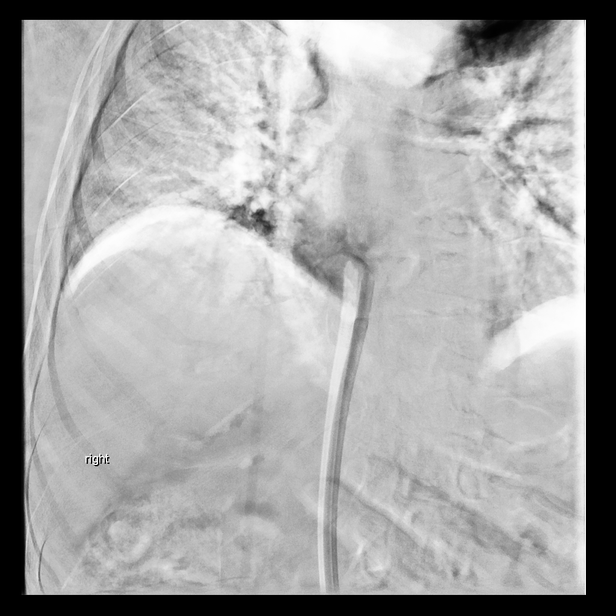
[im 6/6]
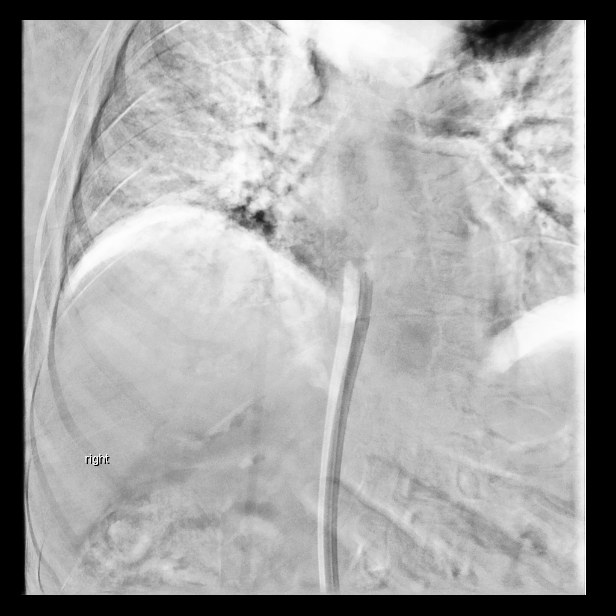

[Series 4: fl (-) angio · 1 of 1 slices shown (2 of 3)]
[im 1/1]
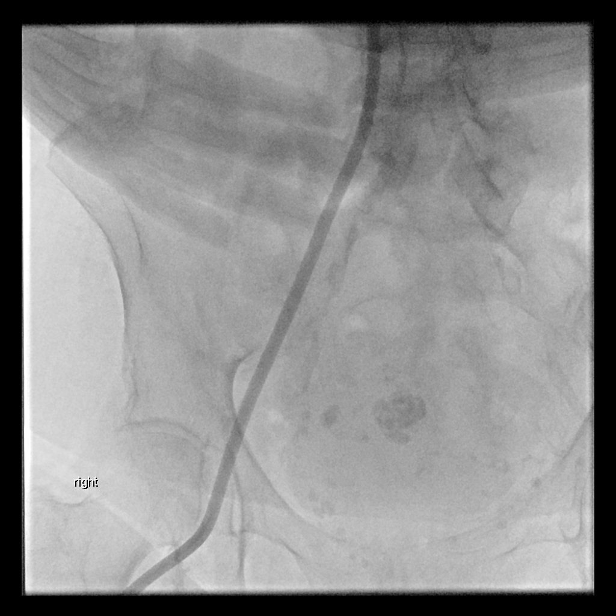

[Series 6: body 4 care · 8 acquisitions, 6 frames shown (2 of 2)]
[im 1/8]
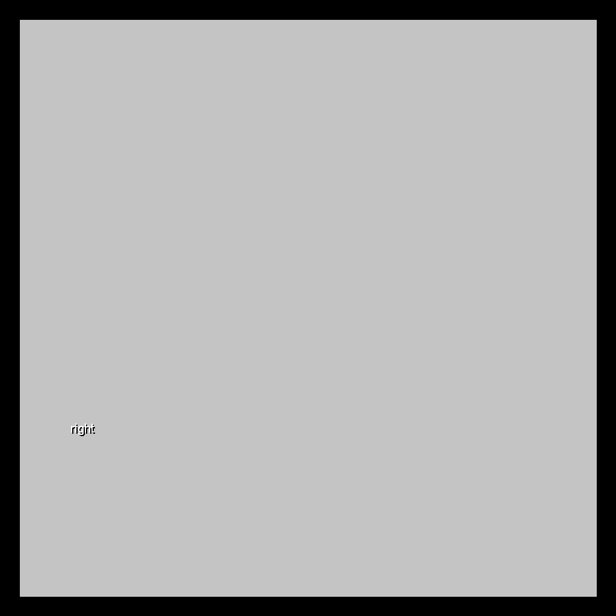
[im 1/8]
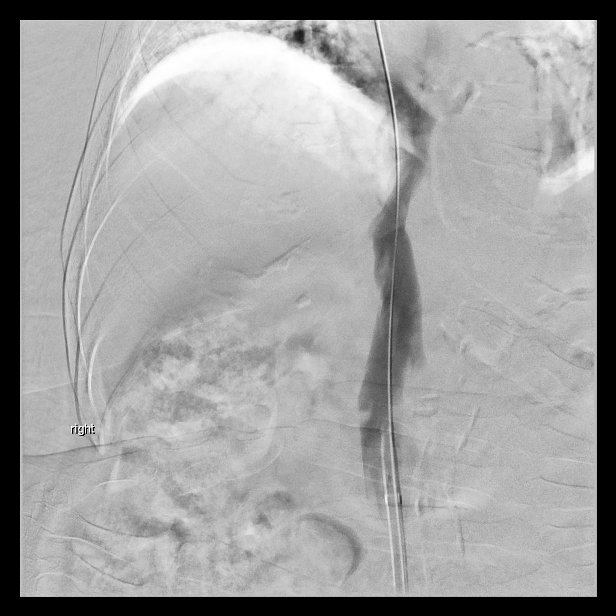
[im 2/8]
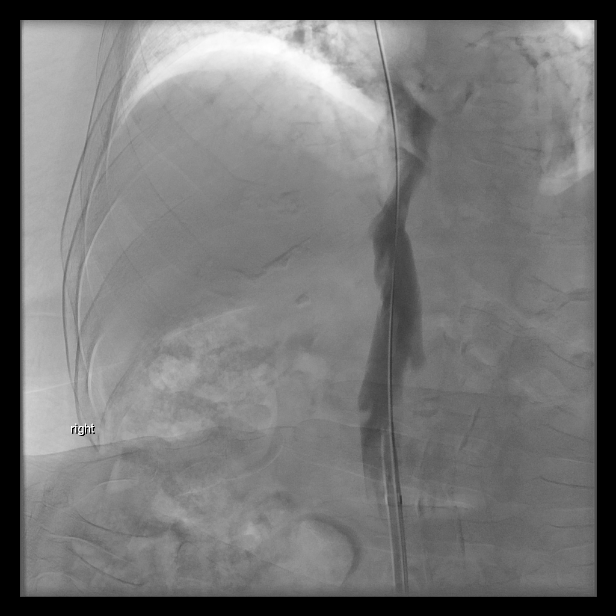
[im 4/8]
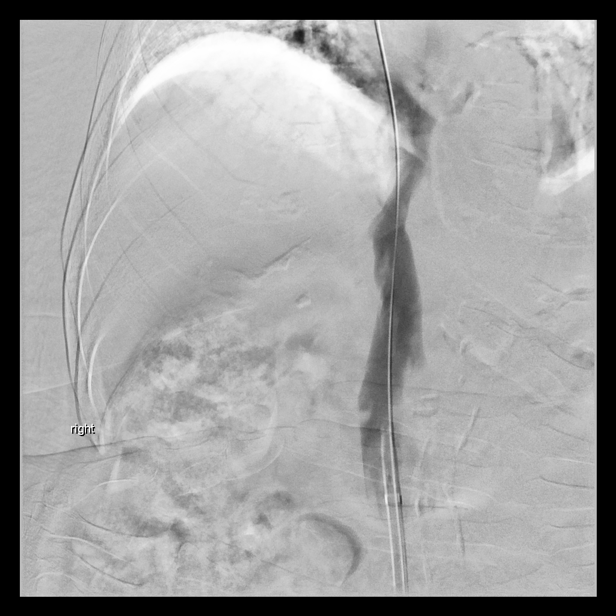
[im 5/8]
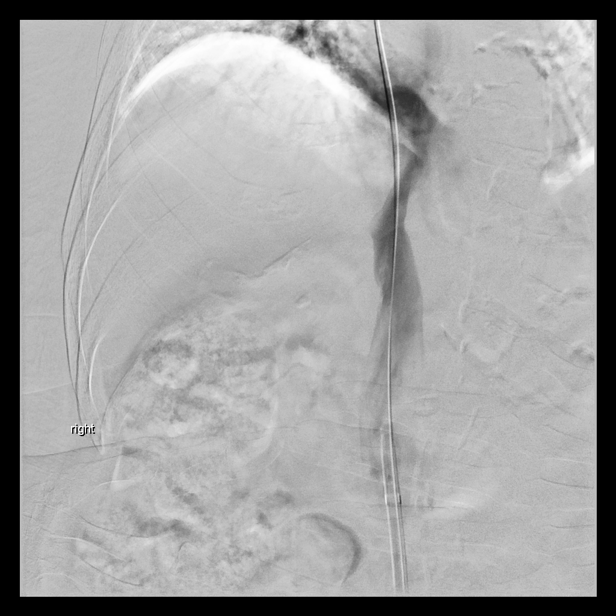
[im 7/8]
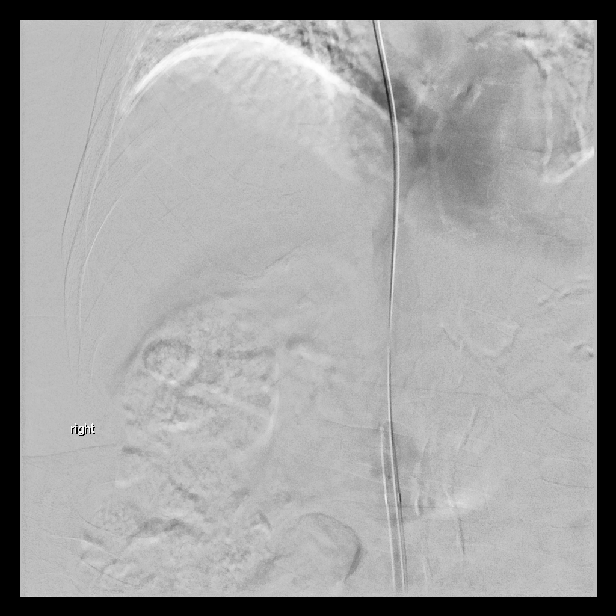

[Series 7: fl (-) angio · 1 of 1 slices shown (3 of 3)]
[im 1/1]
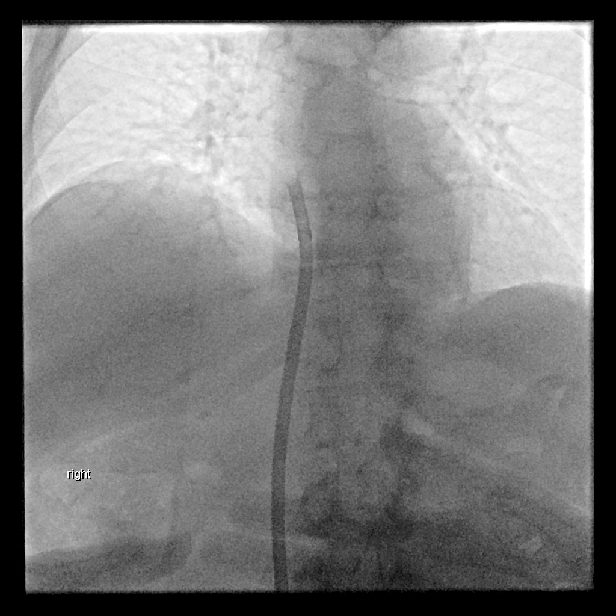

[14 of 24 positions shown; findings below may reference images not displayed]

Right femoral catheter and surrounding Region prepped using maximum
barrier technique including cap and mask, sterile gown, sterile
gloves, large sterile sheet, and Chlorhexidine as cutaneous
antisepsis. The region was infiltrated locally with 1% lidocaine.

Radiographic evaluation of the extant right femoral catheter shows
no kink or other apparent mechanical complication.

Venography through both lumens of the catheter was performed. No
fibrin sheath were venous thrombosis identified.

The catheter was dissected free of the underlying subcutaneous
tissues and removed over an angled stiff glidewire. A new Palindrome
55 cm hemodialysis catheter was advanced into the mid IVC. Inferior
vena cavography was performed. Mild tapered narrowing of the
intrahepatic IVC with no evidence of any significant fibrin sheath.
The catheter was then advanced to the inferior cavoatrial junction.
Spot chest radiograph confirms good catheter position. No
pneumothorax. Catheter was flushed and primed per protocol. Catheter
secured externally with O Prolene sutures. The patient tolerated the
procedure well.

COMPLICATIONS:
COMPLICATIONS
None immediate

FLUOROSCOPY TIME:  36 seconds, 22 mGy
IMPRESSION: 1. Technically successful exchange of tunneled right femoral
hemodialysis catheter with fluoroscopic guidance. Ready for routine
use.

ACCESS:
Remains approachable for percutaneous intervention as needed.

## 2019-01-28 ENCOUNTER — Other Ambulatory Visit (HOSPITAL_COMMUNITY): Payer: Medicare Other
